# Patient Record
Sex: Male | Born: 1955
Health system: Southern US, Community
[De-identification: ages and names within clinical notes are randomized; demographics above are authoritative.]

## PROBLEM LIST (undated history)

## (undated) DIAGNOSIS — I639 Cerebral infarction, unspecified: Secondary | ICD-10-CM

## (undated) DIAGNOSIS — K859 Acute pancreatitis without necrosis or infection, unspecified: Secondary | ICD-10-CM

## (undated) DIAGNOSIS — Z951 Presence of aortocoronary bypass graft: Secondary | ICD-10-CM

## (undated) DIAGNOSIS — I10 Essential (primary) hypertension: Secondary | ICD-10-CM

## (undated) DIAGNOSIS — G629 Polyneuropathy, unspecified: Secondary | ICD-10-CM

## (undated) DIAGNOSIS — I219 Acute myocardial infarction, unspecified: Secondary | ICD-10-CM

## (undated) DIAGNOSIS — D649 Anemia, unspecified: Secondary | ICD-10-CM

## (undated) DIAGNOSIS — I679 Cerebrovascular disease, unspecified: Secondary | ICD-10-CM

## (undated) DIAGNOSIS — M1990 Unspecified osteoarthritis, unspecified site: Secondary | ICD-10-CM

## (undated) DIAGNOSIS — R0600 Dyspnea, unspecified: Secondary | ICD-10-CM

## (undated) DIAGNOSIS — K7460 Unspecified cirrhosis of liver: Secondary | ICD-10-CM

## (undated) DIAGNOSIS — I8500 Esophageal varices without bleeding: Secondary | ICD-10-CM

## (undated) DIAGNOSIS — F32A Depression, unspecified: Secondary | ICD-10-CM

## (undated) DIAGNOSIS — N189 Chronic kidney disease, unspecified: Secondary | ICD-10-CM

## (undated) DIAGNOSIS — K219 Gastro-esophageal reflux disease without esophagitis: Secondary | ICD-10-CM

## (undated) DIAGNOSIS — I2510 Atherosclerotic heart disease of native coronary artery without angina pectoris: Secondary | ICD-10-CM

## (undated) DIAGNOSIS — E785 Hyperlipidemia, unspecified: Secondary | ICD-10-CM

## (undated) DIAGNOSIS — R519 Headache, unspecified: Secondary | ICD-10-CM

## (undated) DIAGNOSIS — F419 Anxiety disorder, unspecified: Secondary | ICD-10-CM

## (undated) DIAGNOSIS — M5090 Cervical disc disorder, unspecified, unspecified cervical region: Secondary | ICD-10-CM

## (undated) DIAGNOSIS — D863 Sarcoidosis of skin: Secondary | ICD-10-CM

## (undated) DIAGNOSIS — E119 Type 2 diabetes mellitus without complications: Secondary | ICD-10-CM

## (undated) DIAGNOSIS — G4730 Sleep apnea, unspecified: Secondary | ICD-10-CM

## (undated) HISTORY — DX: Essential (primary) hypertension: I10

## (undated) HISTORY — DX: Hyperlipidemia, unspecified: E78.5

## (undated) HISTORY — DX: Gastro-esophageal reflux disease without esophagitis: K21.9

## (undated) HISTORY — DX: Type 2 diabetes mellitus without complications: E11.9

## (undated) SURGERY — Surgical Case
Anesthesia: *Unknown

---

## 2005-12-17 ENCOUNTER — Encounter: Admission: RE | Admit: 2005-12-17 | Discharge: 2005-12-17 | Payer: Self-pay | Admitting: Surgery

## 2007-04-23 DIAGNOSIS — I219 Acute myocardial infarction, unspecified: Secondary | ICD-10-CM

## 2007-04-23 HISTORY — DX: Acute myocardial infarction, unspecified: I21.9

## 2007-04-23 HISTORY — PX: CORONARY ANGIOPLASTY WITH STENT PLACEMENT: SHX49

## 2008-01-31 ENCOUNTER — Ambulatory Visit (HOSPITAL_COMMUNITY): Admission: RE | Admit: 2008-01-31 | Discharge: 2008-01-31 | Payer: Self-pay | Admitting: Specialist

## 2010-05-11 LAB — BASIC METABOLIC PANEL
CO2: 25 mEq/L (ref 19–32)
GFR calc non Af Amer: >60 mL/min (ref >60)
Glucose, Bld: 118 mg/dL — ABNORMAL HIGH (ref 70–99)

## 2010-05-11 LAB — CBC
Hemoglobin: 14.9 g/dL (ref 13.0–17.0)
MCHC: 33.2 g/dL (ref 30.0–36.0)
MCV: 93.7 fL (ref 78.0–100.0)
Platelets: 146 10*3/uL — ABNORMAL LOW (ref 150–400)
RDW: 15.4 % (ref 11.5–15.5)

## 2010-05-11 LAB — PROTIME-INR: Prothrombin Time: 12.4 seconds (ref 11.6–15.2)

## 2012-06-25 ENCOUNTER — Ambulatory Visit (HOSPITAL_COMMUNITY): Admit: 2012-06-25 | Payer: Self-pay | Admitting: Cardiovascular Disease

## 2012-06-25 ENCOUNTER — Emergency Department (HOSPITAL_COMMUNITY): Payer: 59

## 2012-06-25 ENCOUNTER — Encounter (HOSPITAL_COMMUNITY): Payer: Self-pay | Admitting: *Deleted

## 2012-06-25 ENCOUNTER — Encounter (HOSPITAL_COMMUNITY)
Admission: EM | Disposition: A | Discharge status: Skilled Nursing Facility | Payer: Self-pay | Source: Home / Self Care | Attending: Cardiology

## 2012-06-25 ENCOUNTER — Inpatient Hospital Stay (HOSPITAL_COMMUNITY)
Admission: EM | Admit: 2012-06-25 | Discharge: 2012-07-10 | DRG: 231 | Disposition: A | Discharge status: Skilled Nursing Facility | Payer: 59 | Attending: Thoracic Surgery (Cardiothoracic Vascular Surgery) | Admitting: Thoracic Surgery (Cardiothoracic Vascular Surgery)

## 2012-06-25 DIAGNOSIS — E119 Type 2 diabetes mellitus without complications: Secondary | ICD-10-CM | POA: Diagnosis present

## 2012-06-25 DIAGNOSIS — I252 Old myocardial infarction: Secondary | ICD-10-CM

## 2012-06-25 DIAGNOSIS — Y840 Cardiac catheterization as the cause of abnormal reaction of the patient, or of later complication, without mention of misadventure at the time of the procedure: Secondary | ICD-10-CM | POA: Diagnosis not present

## 2012-06-25 DIAGNOSIS — D62 Acute posthemorrhagic anemia: Secondary | ICD-10-CM | POA: Diagnosis not present

## 2012-06-25 DIAGNOSIS — E8779 Other fluid overload: Secondary | ICD-10-CM | POA: Diagnosis not present

## 2012-06-25 DIAGNOSIS — I214 Non-ST elevation (NSTEMI) myocardial infarction: Secondary | ICD-10-CM

## 2012-06-25 DIAGNOSIS — G4733 Obstructive sleep apnea (adult) (pediatric): Secondary | ICD-10-CM | POA: Diagnosis present

## 2012-06-25 DIAGNOSIS — J988 Other specified respiratory disorders: Secondary | ICD-10-CM | POA: Diagnosis not present

## 2012-06-25 DIAGNOSIS — S301XXA Contusion of abdominal wall, initial encounter: Secondary | ICD-10-CM

## 2012-06-25 DIAGNOSIS — E781 Pure hyperglyceridemia: Secondary | ICD-10-CM

## 2012-06-25 DIAGNOSIS — I2542 Coronary artery dissection: Secondary | ICD-10-CM | POA: Diagnosis not present

## 2012-06-25 DIAGNOSIS — S3012XA Contusion of groin, initial encounter: Secondary | ICD-10-CM

## 2012-06-25 DIAGNOSIS — G4730 Sleep apnea, unspecified: Secondary | ICD-10-CM

## 2012-06-25 DIAGNOSIS — F411 Generalized anxiety disorder: Secondary | ICD-10-CM | POA: Diagnosis not present

## 2012-06-25 DIAGNOSIS — Z9861 Coronary angioplasty status: Secondary | ICD-10-CM

## 2012-06-25 DIAGNOSIS — IMO0002 Reserved for concepts with insufficient information to code with codable children: Secondary | ICD-10-CM

## 2012-06-25 DIAGNOSIS — Z7982 Long term (current) use of aspirin: Secondary | ICD-10-CM

## 2012-06-25 DIAGNOSIS — Z951 Presence of aortocoronary bypass graft: Secondary | ICD-10-CM

## 2012-06-25 DIAGNOSIS — Y832 Surgical operation with anastomosis, bypass or graft as the cause of abnormal reaction of the patient, or of later complication, without mention of misadventure at the time of the procedure: Secondary | ICD-10-CM | POA: Diagnosis not present

## 2012-06-25 DIAGNOSIS — I2510 Atherosclerotic heart disease of native coronary artery without angina pectoris: Secondary | ICD-10-CM

## 2012-06-25 DIAGNOSIS — S3012XD Contusion of groin, subsequent encounter: Secondary | ICD-10-CM

## 2012-06-25 DIAGNOSIS — R0609 Other forms of dyspnea: Secondary | ICD-10-CM | POA: Diagnosis present

## 2012-06-25 DIAGNOSIS — I739 Peripheral vascular disease, unspecified: Secondary | ICD-10-CM

## 2012-06-25 DIAGNOSIS — D696 Thrombocytopenia, unspecified: Secondary | ICD-10-CM | POA: Diagnosis not present

## 2012-06-25 DIAGNOSIS — Z79899 Other long term (current) drug therapy: Secondary | ICD-10-CM

## 2012-06-25 DIAGNOSIS — E1165 Type 2 diabetes mellitus with hyperglycemia: Secondary | ICD-10-CM

## 2012-06-25 DIAGNOSIS — R0989 Other specified symptoms and signs involving the circulatory and respiratory systems: Secondary | ICD-10-CM | POA: Diagnosis present

## 2012-06-25 DIAGNOSIS — I519 Heart disease, unspecified: Secondary | ICD-10-CM | POA: Diagnosis not present

## 2012-06-25 DIAGNOSIS — S301XXD Contusion of abdominal wall, subsequent encounter: Secondary | ICD-10-CM

## 2012-06-25 DIAGNOSIS — Z6841 Body Mass Index (BMI) 40.0 and over, adult: Secondary | ICD-10-CM

## 2012-06-25 DIAGNOSIS — J9819 Other pulmonary collapse: Secondary | ICD-10-CM | POA: Diagnosis not present

## 2012-06-25 HISTORY — DX: Presence of aortocoronary bypass graft: Z95.1

## 2012-06-25 HISTORY — DX: Acute myocardial infarction, unspecified: I21.9

## 2012-06-25 HISTORY — DX: Morbid (severe) obesity due to excess calories: E66.01

## 2012-06-25 HISTORY — PX: LEFT HEART CATHETERIZATION WITH CORONARY ANGIOGRAM: SHX5451

## 2012-06-25 HISTORY — DX: Atherosclerotic heart disease of native coronary artery without angina pectoris: I25.10

## 2012-06-25 HISTORY — DX: Sleep apnea, unspecified: G47.30

## 2012-06-25 LAB — COMPREHENSIVE METABOLIC PANEL
ALT: 50 U/L (ref 0–53)
AST: 29 U/L (ref 0–37)
BUN: 14 mg/dL (ref 6–23)
CO2: 23 mEq/L (ref 19–32)
Creatinine, Ser: 0.78 mg/dL (ref 0.50–1.35)
Glucose, Bld: 302 mg/dL — ABNORMAL HIGH (ref 70–99)
Sodium: 132 mEq/L — ABNORMAL LOW (ref 135–145)
Total Bilirubin: 0.5 mg/dL (ref 0.3–1.2)
Total Protein: 7.5 g/dL (ref 6.0–8.3)

## 2012-06-25 LAB — CBC
HCT: 44.7 % (ref 39.0–52.0)
Hemoglobin: 16 g/dL (ref 13.0–17.0)
WBC: 7.2 10*3/uL (ref 4.0–10.5)

## 2012-06-25 LAB — PROTIME-INR: Prothrombin Time: 12.5 seconds (ref 11.6–15.2)

## 2012-06-25 SURGERY — LEFT HEART CATHETERIZATION WITH CORONARY ANGIOGRAM
Anesthesia: LOCAL

## 2012-06-25 MED ORDER — METOPROLOL TARTRATE 25 MG PO TABS
25.0000 mg | ORAL_TABLET | Freq: Once | ORAL | Status: DC
  Administered 2012-06-25: 25 mg via ORAL
  Filled 2012-06-25: qty 1

## 2012-06-25 MED ORDER — HEPARIN BOLUS VIA INFUSION: 5000.0000 [IU] | Freq: Once | INTRAVENOUS | Status: DC

## 2012-06-25 MED ORDER — FENTANYL CITRATE 0.05 MG/ML IJ SOLN
INTRAMUSCULAR | Status: AC
  Filled 2012-06-25: qty 2

## 2012-06-25 MED ORDER — SODIUM CHLORIDE 0.9 % IJ SOLN: 3.0000 mL | INTRAMUSCULAR | Status: DC | PRN

## 2012-06-25 MED ORDER — NITROGLYCERIN 0.4 MG SL SUBL: 0.4000 mg | SUBLINGUAL_TABLET | SUBLINGUAL | Status: DC | PRN

## 2012-06-25 MED ORDER — NITROGLYCERIN IN D5W 200-5 MCG/ML-% IV SOLN
2.0000 ug/min | Freq: Once | INTRAVENOUS | Status: AC
  Administered 2012-06-25: 5 ug/min via INTRAVENOUS
  Filled 2012-06-25: qty 250

## 2012-06-25 MED ORDER — VERAPAMIL HCL 2.5 MG/ML IV SOLN
INTRAVENOUS | Status: AC
  Filled 2012-06-25: qty 2

## 2012-06-25 MED ORDER — LIDOCAINE HCL (PF) 1 % IJ SOLN
INTRAMUSCULAR | Status: AC
  Filled 2012-06-25: qty 30

## 2012-06-25 MED ORDER — MORPHINE SULFATE 4 MG/ML IJ SOLN
4.0000 mg | Freq: Once | INTRAMUSCULAR | Status: AC
  Administered 2012-06-25: 4 mg via INTRAVENOUS
  Filled 2012-06-25: qty 1

## 2012-06-25 MED ORDER — SODIUM CHLORIDE 0.9 % IJ SOLN: 3.0000 mL | Freq: Two times a day (BID) | INTRAMUSCULAR | Status: DC

## 2012-06-25 MED ORDER — ASPIRIN 81 MG PO CHEW
81.0000 mg | CHEWABLE_TABLET | Freq: Every day | ORAL | Status: DC
  Administered 2012-06-26 – 2012-07-03 (×8): 81 mg via ORAL
  Filled 2012-06-25 (×8): qty 1

## 2012-06-25 MED ORDER — DIPHENHYDRAMINE HCL 25 MG PO TABS
50.0000 mg | ORAL_TABLET | Freq: Every evening | ORAL | Status: DC | PRN
  Filled 2012-06-25: qty 2

## 2012-06-25 MED ORDER — ASPIRIN EC 81 MG PO TBEC: 81.0000 mg | DELAYED_RELEASE_TABLET | Freq: Every day | ORAL | Status: DC

## 2012-06-25 MED ORDER — ZOLPIDEM TARTRATE 5 MG PO TABS: 5.0000 mg | ORAL_TABLET | Freq: Every evening | ORAL | Status: DC | PRN

## 2012-06-25 MED ORDER — ACETAMINOPHEN 325 MG PO TABS
650.0000 mg | ORAL_TABLET | ORAL | Status: DC | PRN
  Administered 2012-06-25: 650 mg via ORAL
  Filled 2012-06-25: qty 2

## 2012-06-25 MED ORDER — SODIUM CHLORIDE 0.9 % IV SOLN
INTRAVENOUS | Status: AC
  Administered 2012-06-26: 01:00:00 via INTRAVENOUS

## 2012-06-25 MED ORDER — TICAGRELOR 90 MG PO TABS
90.0000 mg | ORAL_TABLET | Freq: Two times a day (BID) | ORAL | Status: DC
  Administered 2012-06-26 (×2): 90 mg via ORAL
  Filled 2012-06-25 (×4): qty 1

## 2012-06-25 MED ORDER — HEPARIN (PORCINE) IN NACL 100-0.45 UNIT/ML-% IJ SOLN
1500.0000 [IU]/h | INTRAMUSCULAR | Status: DC
  Administered 2012-06-25: 1500 [IU]/h via INTRAVENOUS
  Filled 2012-06-25 (×2): qty 250

## 2012-06-25 MED ORDER — DIAZEPAM 5 MG PO TABS
5.0000 mg | ORAL_TABLET | ORAL | Status: AC
  Administered 2012-06-25: 5 mg via ORAL
  Filled 2012-06-25: qty 1

## 2012-06-25 MED ORDER — TICAGRELOR 90 MG PO TABS: 90.0000 mg | ORAL_TABLET | Freq: Two times a day (BID) | ORAL | Status: DC

## 2012-06-25 MED ORDER — TICAGRELOR 90 MG PO TABS
180.0000 mg | ORAL_TABLET | Freq: Once | ORAL | Status: DC
  Administered 2012-06-25: 180 mg via ORAL
  Filled 2012-06-25: qty 2

## 2012-06-25 MED ORDER — HEPARIN BOLUS VIA INFUSION
4000.0000 [IU] | Freq: Once | INTRAVENOUS | Status: AC
  Administered 2012-06-25: 4000 [IU] via INTRAVENOUS

## 2012-06-25 MED ORDER — METOPROLOL TARTRATE 12.5 MG HALF TABLET
12.5000 mg | ORAL_TABLET | Freq: Four times a day (QID) | ORAL | Status: DC
  Administered 2012-06-25 – 2012-06-26 (×2): 12.5 mg via ORAL
  Filled 2012-06-25 (×7): qty 1

## 2012-06-25 MED ORDER — ATORVASTATIN CALCIUM 80 MG PO TABS
80.0000 mg | ORAL_TABLET | Freq: Every day | ORAL | Status: DC
  Administered 2012-06-26 – 2012-07-03 (×8): 80 mg via ORAL
  Filled 2012-06-25 (×10): qty 1

## 2012-06-25 MED ORDER — MIDAZOLAM HCL 2 MG/2ML IJ SOLN
INTRAMUSCULAR | Status: AC
  Filled 2012-06-25: qty 2

## 2012-06-25 MED ORDER — BIVALIRUDIN 250 MG IV SOLR
INTRAVENOUS | Status: AC
  Filled 2012-06-25: qty 250

## 2012-06-25 MED ORDER — HEPARIN (PORCINE) IN NACL 2-0.9 UNIT/ML-% IJ SOLN
INTRAMUSCULAR | Status: AC
  Filled 2012-06-25: qty 1000

## 2012-06-25 MED ORDER — MORPHINE SULFATE 2 MG/ML IJ SOLN
2.0000 mg | INTRAMUSCULAR | Status: DC | PRN
  Administered 2012-06-26 (×3): 2 mg via INTRAVENOUS
  Filled 2012-06-25 (×3): qty 1

## 2012-06-25 MED ORDER — SODIUM CHLORIDE 0.9 % IV SOLN: 250.0000 mL | INTRAVENOUS | Status: DC | PRN

## 2012-06-25 MED ORDER — ACETAMINOPHEN 325 MG PO TABS
650.0000 mg | ORAL_TABLET | ORAL | Status: DC | PRN
  Administered 2012-06-27: 650 mg via ORAL
  Filled 2012-06-25: qty 2

## 2012-06-25 MED ORDER — ONDANSETRON HCL 4 MG/2ML IJ SOLN: 4.0000 mg | Freq: Four times a day (QID) | INTRAMUSCULAR | Status: DC | PRN

## 2012-06-25 MED ORDER — ALPRAZOLAM 0.25 MG PO TABS
0.2500 mg | ORAL_TABLET | Freq: Two times a day (BID) | ORAL | Status: DC | PRN
  Administered 2012-06-25: 0.25 mg via ORAL
  Filled 2012-06-25: qty 1

## 2012-06-25 MED ORDER — SODIUM CHLORIDE 0.9 % IV SOLN
INTRAVENOUS | Status: DC
  Administered 2012-06-25: 22:00:00 via INTRAVENOUS

## 2012-06-25 MED ORDER — NITROGLYCERIN IN D5W 200-5 MCG/ML-% IV SOLN
2.0000 ug/min | INTRAVENOUS | Status: AC
  Administered 2012-06-25: 30 ug/min via INTRAVENOUS
  Administered 2012-06-26: 40 ug/min via INTRAVENOUS

## 2012-06-25 NOTE — Progress Notes (Addendum)
ANTICOAGULATION CONSULT NOTE - Initial Consult  Pharmacy Consult for UFH Indication: NSTEMI  No Known Allergies  Patient Measurements: Height: 5\' 6"  (167.6 cm) Weight: 280 lb (127.007 kg) IBW/kg (Calculated) : 63.8 Heparin Dosing Weight: 94kg  Vital Signs: Temp: 97.9 F (36.6 C) (06/01 1016) Temp src: Oral (06/01 1016) BP: 138/87 mmHg (06/01 1350) Pulse Rate: 63 (06/01 1350)  Labs:  Recent Labs  06/25/12 1057 06/25/12 1349  HGB 16.0  --   HCT 44.7  --   PLT 152  --   LABPROT 12.5  --   INR 0.94  --   CREATININE 0.78  --   TROPONINI  --  0.47*    Estimated Creatinine Clearance: 131.5 ml/min (by C-G formula based on Cr of 0.78).   Medical History: Past Medical History  Diagnosis Date  . Coronary artery disease   . MI (myocardial infarction)   . Sleep apnea     Medications:   (Not in a hospital admission)  Assessment: 57 y/o male patient admitted with chest pain requiring anticoagulation for NSTEMI. First troponin is elevated. EKG pending.  Goal of Therapy:  Heparin level 0.3-0.7 units/ml Monitor platelets by anticoagulation protocol: Yes   Plan:  Heparin 4000 unit IV bolus followed by infusion at 1500 units/hr. Check 6 hour heparin level with daily cbc and heparin level.  Verlene Mayer, PharmD, BCPS Pager [phone] 06/25/2012,3:09 PM

## 2012-06-25 NOTE — ED Notes (Signed)
Patient transported to X-ray 

## 2012-06-25 NOTE — Progress Notes (Signed)
CTSP secondary to ongoing CP.  He has a history of CAD with PCI around 4 years ago.  He was admitted with unstable angina and ruled in for a NSTEMI.  Throughout today he has continued to have CP despite increasing NTG gtt to .  He currently complains of 5/10CP.  Troponin has increased from 0.47 to 2.02.  I have spoken to Dr. Allyson Sabal and we will take patient to the cath lab for cath due to evidence of ongoing ischemia by increasing cardiac enzymes in setting of continued CP.

## 2012-06-25 NOTE — H&P (Signed)
Physician History and Physical    Barry [name] MRN: [id number] DOB/AGE: Nov 11, 1955 57 y.o. Admit date: 06/25/2012  Primary Care Physician: None Primary Cardiologist: None  HPI:  57 yo with history of CAD s/p MI about 4 years ago and PCI in [place] presents to ER today with NSTEMI. Patient has been having exertional dyspnea but not chest pain for about a month.  The shortness of breath comes with walking more than just a short distance.  Last night, he developed a severe headache.  The headache resolved after Ibuprofen but he later woke up with substernal chest pressure.  This resolved in about 1 hour (he iced his chest) and he went back to sleep.  This morning, while making breakfast, the chest pain returned.  It was severe 10/10 like an elephant on his chest.  He went to the ER and had morphine and NTG sublingual.  He has just been started on NTG gtt at 5.  CP is down to 4/10 (much impoved but still there).  Initial TnI was 0.47.  Initial ECG showed slight ST elevation in II but repeat ECG showed that this had basically resolved.   Patient has not seen a doctor for about 4 years since around the time of his prior MI.  He is not taking any medications.  He does use CPAP for OSA.   Review of systems complete and found to be negative unless listed above   PMH: 1. CAD: MI about 4 years ago, had stent placed in [facility].  Has not had cardiology followup.  2. Obesity 3. OSA on CPAP  Family History:  Mother with "heart trouble"   History   Social History  . Marital Status: Married    Spouse Name: N/A    Number of Children: N/A  . Years of Education: N/A   Occupational History  . Maintenance for [employer]   Social History Main Topics  . Smoking status: Never smoked  . Smokeless tobacco: Not on file  . Alcohol Use: Not on file  . Drug Use: Not on file  . Sexually Active: Not on file   Other Topics Concern  . Not on file   Social History Narrative  . Lives in [place] [facility]       (Not in a hospital admission)  Physical Exam: Blood pressure 138/87, pulse 63, temperature 97.9 F (36.6 C), temperature source Oral, resp. rate 19, height 5\' 6"  (1.676 m), weight 280 lb (127.007 kg), SpO2 95.00%.  General: NAD, obese Neck: Thick, JVP difficult, no thyromegaly or thyroid nodule.  Lungs: Clear to auscultation bilaterally with normal respiratory effort. CV: Nondisplaced PMI.  Heart regular S1/S2, no S3/S4, no murmur.  No peripheral edema.  No carotid bruit.  Normal pedal pulses.  Abdomen: Soft, nontender, no hepatosplenomegaly, no distention.  Skin: Intact without lesions or rashes.  Neurologic: Alert and oriented x 3.  Psych: Normal affect. Extremities: No clubbing or cyanosis.  HEENT: Normal.   Labs:   Lab Results  Component Value Date   WBC 7.2 06/25/2012   HGB 16.0 06/25/2012   HCT 44.7 06/25/2012   MCV 86.3 06/25/2012   PLT 152 06/25/2012    Recent Labs Lab 06/25/12 1057  NA 132*  K 4.1  CL 97  CO2 23  BUN 14  CREATININE 0.78  CALCIUM 9.4  PROT 7.5  BILITOT 0.5  ALKPHOS 100  ALT 50  AST 29  GLUCOSE 302*   Lab Results  Component Value Date  TROPONINI 0.47* 06/25/2012     Radiology: - CXR: Cardiomegaly, otherwise lungs clear  EKG: NSR, very slight ST elevation in II on initial ECG, seems to mostly resolve on followup ECG.   ASSESSMENT AND PLAN:  57 yo with history of CAD s/p MI about 4 years ago and PCI in [place] presents to ER today with NSTEMI.  1. CAD: NSTEMI.  Patient has not taken any meds for years.  He developed CP last night initially then recurred this morning.  Pain was 10/10, now resolving since NTG gtt started and 4/10.  ECG with slight ST elevation II initially, now resolved on repeat ECG.  - Cycle cardiac enzymes to peak - Cycle ECGs - Will need LHC, likely in am if pain resolves completely (will turn up NTG gtt).  - I will load Brilinta this afternoon.  - heparin gtt, ASA 81, atorvastatin 80, metoprolol 12.5 mg po q6 hrs.  -  Check lipids and hemoglobin A1c.  2. OSA: Will continue CPAP.   Signed: Marca Ancona 06/25/2012, 3:50 PM

## 2012-06-25 NOTE — H&P (Signed)
    Pt was reexamined and existing H & P reviewed. No changes found.  [name], MD [facility] 06/25/2012 9:44 PM

## 2012-06-25 NOTE — Progress Notes (Deleted)
ANTICOAGULATION CONSULT NOTE - Initial Consult  Pharmacy Consult for Heparin Indication:  Chest Pain  No Known Allergies  Patient Measurements: Height: 5\' 6"  (167.6 cm) Weight: 280 lb (127.007 kg) IBW/kg (Calculated) : 63.8  Vital Signs: Temp: 97.9 F (36.6 C) (06/01 1016) Temp src: Oral (06/01 1016) BP: 138/87 mmHg (06/01 1350) Pulse Rate: 63 (06/01 1350)  Labs:  Recent Labs  06/25/12 1057 06/25/12 1349  HGB 16.0  --   HCT 44.7  --   PLT 152  --   LABPROT 12.5  --   INR 0.94  --   CREATININE 0.78  --   TROPONINI  --  0.47*   Estimated Creatinine Clearance: 131.5 ml/min (by C-G formula based on Cr of 0.78).  Medical History: Past Medical History  Diagnosis Date  . Coronary artery disease   . MI (myocardial infarction)   . Sleep apnea    Medications:  Aspirin 81 mg daily  Assessment: 57 yo male admitted with ongoing chest pain since this morning.  He has a past medical history of CAD with stent ~4 years ago.  No noted bleeding history and patient states he only takes aspirin at home.  His CBC is stable.  Troponin is elevated.  Goal of Therapy:  Heparin level 0.3-0.7 units/ml Monitor platelets by anticoagulation protocol: Yes   Plan:   Heparin 5000 units IV bolus x 1  Heparin 1000 units./hr  Check 8 hour heparin level if he doesn't go to cath lab.  Nadara Mustard, PharmD., MS Clinical Pharmacist Pager:  [phone] Thank you for allowing pharmacy to be part of this patients care team.  06/25/2012,3:09 PM

## 2012-06-25 NOTE — ED Notes (Signed)
Pt arrived by [facility]. Reports mid chest pressure that started last night with nausea. Pain radiates to abd, thought it was heartburn. Pt was pale and diaphoretic on ems arrival, took asa at home. Received 3 nitro pta, pain is 2/10 now. Last bp 140/90. Reports mild sob, increases with movement.

## 2012-06-25 NOTE — CV Procedure (Signed)
Barry [name] is a 57 y.o. male    [id number] LOCATION:  FACILITY: [facility]  PHYSICIAN: Nanetta Batty, M.D. 1955-02-05   DATE OF PROCEDURE:  06/25/2012  DATE OF DISCHARGE:  [facility]  CARDIAC CATHETERIZATION     History obtained from chart review.57 yo with history of CAD s/p MI about 4 years ago and PCI in [place] presents to ER today with NSTEMI. Patient has been having exertional dyspnea but not chest pain for about a month. The shortness of breath comes with walking more than just a short distance. Last night, he developed a severe headache. The headache resolved after Ibuprofen but he later woke up with substernal chest pressure. This resolved in about 1 hour (he iced his chest) and he went back to sleep. This morning, while making breakfast, the chest pain returned. It was severe 10/10 like an elephant on his chest. He went to the ER and had morphine and NTG sublingual. He has just been started on NTG gtt at 5. CP is down to 4/10 (much impoved but still there). Initial TnI was 0.47. Initial ECG showed slight ST elevation in II but repeat ECG showed that this had basically resolved. Because of a slightly increased troponin and mildly increased chest pain with increasing doses of IV nitroglycerin Dr. Carolanne Grumbling evaluated the patient and felt that the patient should come to the Cath Lab for intervention.    PROCEDURE DESCRIPTION:    The patient was brought to the second floor [facility] cath lab in the postabsorptive state. He was premedicated with Valium 5 mg by mouth, IV Versed and fentanyl.Marland Kitchen His right groin was prepped and shaved in usual sterile fashion (after failed attempt at radial access). Xylocaine 1% was used for local anesthesia. A 6 French sheath was inserted into the right common femoral  artery using standard Seldinger technique.6 French right and left Judkins diagnostic catheters along with a 6 French pigtail catheter were used for selective  coronary angiography, left ventriculography, subselective left internal mammary artery angiography and distal abdominal aortography. Visipaque dye was used for the entirety of the case. Retrograde aortic, left ventricular and pullback pressures were recorded.   HEMODYNAMICS:    AO SYSTOLIC/AO DIASTOLIC: 100/70   LV SYSTOLIC/LV DIASTOLIC: 101/18  ANGIOGRAPHIC RESULTS:   1. Left main; normal  2. LAD; 95-99% stenosis at the bifurcation of the first large side branch probably representing "in-stent restenosis with a proximal LAD stent. 3. Left circumflex; nondominant and normal.  4. Right coronary artery; dominant 7580% segmental proximal, 80% mid and a total posterolateral branch representing the "culprit vessel" 5.LIMA was subselectively visualized a widely patent. It was suitable for use during cord artery bypass grafting if necessary 6. Left ventriculography; RAO left ventriculogram was performed using  25 mL of Visipaque dye at 12 mL/second. The overall LVEF estimated  50-55 %  With wall motion abnormalities notable for mild inferobasal hypokinesia 7. Abdominal aortography: Renal arteries are widely patent. The infrarenal abdominal aorta and iliac bifurcation were free of significant atherosclerotic changes  IMPRESSION:Barry [name] has a totally occluded distal right with high-grade proximal LAD diagonal branch bifurcation disease. He hasn't inferior wall motion abnormalities suggesting that his distal RCA is infarct-related artery. He has already been loaded with Brilenta. Proceed with PCI and stenting of his dominant RCA.   Procedure description: the patient received Angiomax bolus with an ACT of 410. A total of 225 cc of contrast was administered to the patient. Initially a JR  4 with sidehole catheter was used to perform intervention however this resulted in a linear dissection of the proximal RCA which spiraled down to the midportion. I then switched to a All right guide catheter with  sideholes along with an 014/300 cm length Asahi Prowater guidewire. I was able to reenter the true lumen across the total occlusion. A Z-plasty with a 2 mm x 12 mm long apex balloon restoring antegrade flow. I then stented the entire proximal and mid dominant RCA with overlapping Promus Premier drug-eluting stents (30/38, 30/20). I then used a 20 by 18 mm long mini vision bare-metal stent in the distal RCA deployed at 16 atmospheres (2.3 mm) resulting in reduction of a total occlusion to 0% residual. I postdilated the proximal and mid stented segment with a 3.25 x 20 mm donor long noncompliant balloon up to 16 atmospheres. There was excellent flow in the procedure the patient remained hemodynamically stable. Dr. Tyrone Sage was present at the end we discussed future or not revascularization strategies to the LAD diagonal branch.  Final impression: Successful PCI and stenting of the dominant RCA complicated by spiral dissection with 2 overlapping drug-eluting stents proximally and mid and a bare-metal stent distal. The patient will be treated with aspirin and Brilenta. I am dubious that his LAD diagonal branch is percutaneously addressable I suspect he'll need a LIMA to to those vessels. We will have to stop the Brilenta &  start Integrilin. We'll review colleagues.[name] MD, [facility] 06/25/2012 11:55 PM

## 2012-06-25 NOTE — ED Provider Notes (Signed)
History     CSN: [id number]  Arrival date & time 06/25/12  1008   First MD Initiated Contact with Patient 06/25/12 1018      Chief Complaint  Patient presents with  . Chest Pain     HPI Patient reports his had chest pain for most of the morning.  He has known history of coronary artery disease with a stent to his RCA years ago.  He has not taking any Plavix at this time is only on 81 mg aspirin.  He reports over the past several weeks she's had intermittent chest pain with associated shortness of breath it seems to be exertional in nature.  He does however also report a new rash in his chest and reports that his pain in his chest exacerbated with movement of his arms as well as pressure in his chest.  He became concerned about this chest discomfort this morning because it was more severe and thus he presented emergency para per evaluation.  EMS was called.  Aspirin and nitroglycerin given.  The patient reports some improvement with nitroglycerin but still reports ongoing pain in his chest when I press on it.  He denies fevers and chills.  No cough or congestion.  No shortness of breath at this time.  Her shoulder pain arm pain back pain or jaw pain at this time.   Past Medical History  Diagnosis Date  . Coronary artery disease   . MI (myocardial infarction)   . Sleep apnea     Past Surgical History  Procedure Laterality Date  . Coronary angioplasty with stent placement      History reviewed. No pertinent family history.  History  Substance Use Topics  . Smoking status: Not on file  . Smokeless tobacco: Not on file  . Alcohol Use: Not on file      Review of Systems  All other systems reviewed and are negative.    Allergies  Review of patient's allergies indicates no known allergies.  Home Medications   Current Outpatient Rx  Name  Route  Sig  Dispense  Refill  . aspirin 81 MG chewable tablet   Oral   Chew 324 mg by mouth once.         . diphenhydrAMINE  (BENADRYL) 25 MG tablet   Oral   Take 50 mg by mouth at bedtime as needed for itching or allergies.         Marland Kitchen ibuprofen (ADVIL,MOTRIN) 200 MG tablet   Oral   Take 600-800 mg by mouth every 8 (eight) hours as needed for pain.           BP 138/87  Pulse 63  Temp(Src) 97.9 F (36.6 C) (Oral)  Resp 19  SpO2 95%  Physical Exam  Nursing note and vitals reviewed. Constitutional: He is oriented to person, place, and time. He appears well-developed and well-nourished.  HENT:  Head: Normocephalic and atraumatic.  Eyes: EOM are normal.  Neck: Normal range of motion.  Cardiovascular: Normal rate, regular rhythm, normal heart sounds and intact distal pulses.   Pulmonary/Chest: Effort normal and breath sounds normal. No respiratory distress. He exhibits tenderness.  Abdominal: Soft. He exhibits no distension. There is no tenderness. There is no rebound and no guarding.  Genitourinary: Rectum normal.  Musculoskeletal: Normal range of motion.  Neurological: He is alert and oriented to person, place, and time.  Skin: Skin is warm and dry.  Psychiatric: He has a normal mood and affect. Judgment normal.  ED Course  Procedures (including critical care time)   Date: 06/25/2012 1019am  Rate: 60  Rhythm: normal sinus rhythm  QRS Axis: normal  Intervals: normal  ST/T Wave abnormalities: Nonspecific ST change to lead II only, no ST depression or reciprocal changes   Conduction Disutrbances: none  Narrative Interpretation:   Old EKG Reviewed: No prior EKG available   Date: 06/25/2012 1030am  Rate: 63  Rhythm: normal sinus rhythm  QRS Axis: normal  Intervals: normal  ST/T Wave abnormalities: Nonspecific isolated ST change to lead II only  Conduction Disutrbances: none  Narrative Interpretation:   Old EKG Reviewed: No significant changes noted   CRITICAL CARE Performed by: Lyanne Co Total critical care time: 32 Critical care time was exclusive of separately billable  procedures and treating other patients. Critical care was necessary to treat or prevent imminent or life-threatening deterioration. Critical care was time spent personally by me on the following activities: development of treatment plan with patient and/or surrogate as well as nursing, discussions with consultants, evaluation of patient's response to treatment, examination of patient, obtaining history from patient or surrogate, ordering and performing treatments and interventions, ordering and review of laboratory studies, ordering and review of radiographic studies, pulse oximetry and re-evaluation of patient's condition.    Labs Reviewed  COMPREHENSIVE METABOLIC PANEL - Abnormal; Notable for the following:    Sodium 132 (*)    Glucose, Bld 302 (*)    All other components within normal limits  TROPONIN I - Abnormal; Notable for the following:    Troponin I 0.47 (*)    All other components within normal limits  CBC  PROTIME-INR   Dg Chest 2 View  06/25/2012   *RADIOLOGY REPORT*  Clinical Data: Chest pain, shortness of breath  CHEST - 2 VIEW  Comparison: None.  Findings: Mild cardiomegaly.  Lungs clear.  No effusion.  Regional bones unremarkable.  IMPRESSION: There are mild cardiomegaly   Original Report Authenticated By: D. Andria Rhein, MD   I personally reviewed the imaging tests through PACS system I reviewed available ER/hospitalization records through the EMR   1. NSTEMI (non-ST elevated myocardial infarction)    2.  Unstable angina   MDM  Both typical and atypical components as the patient's story is somewhat concerning for unstable angina except for the point that he is sore when I press on his chest.  He did have one isolated nonspecific ST change in lead II that has not significantly changed on serial EKG.  No prior EKG available.  Patient does have a known history of coronary artery disease with a stent to his RCA.  Troponins come back at 0.47.  This may represent change of his  troponins secondary to unstable angina.  The patient be started on a heparin drip and nitro drip at this time.  Still is having some "heaviness" in his arms and thus a repeat EKG will be performed at this time.  Doubt pulmonary embolism.  No hypoxia.  Chest x-ray clear.  Cardiology to be consulted.         Lyanne Co, MD 06/25/12 1459

## 2012-06-26 ENCOUNTER — Other Ambulatory Visit: Payer: Self-pay | Admitting: *Deleted

## 2012-06-26 ENCOUNTER — Encounter (HOSPITAL_COMMUNITY): Payer: Self-pay | Admitting: Thoracic Surgery (Cardiothoracic Vascular Surgery)

## 2012-06-26 DIAGNOSIS — S301XXA Contusion of abdominal wall, initial encounter: Secondary | ICD-10-CM

## 2012-06-26 DIAGNOSIS — IMO0001 Reserved for inherently not codable concepts without codable children: Secondary | ICD-10-CM

## 2012-06-26 DIAGNOSIS — G4730 Sleep apnea, unspecified: Secondary | ICD-10-CM

## 2012-06-26 DIAGNOSIS — E1165 Type 2 diabetes mellitus with hyperglycemia: Secondary | ICD-10-CM | POA: Diagnosis present

## 2012-06-26 DIAGNOSIS — G4733 Obstructive sleep apnea (adult) (pediatric): Secondary | ICD-10-CM | POA: Diagnosis present

## 2012-06-26 DIAGNOSIS — E781 Pure hyperglyceridemia: Secondary | ICD-10-CM | POA: Diagnosis present

## 2012-06-26 DIAGNOSIS — I2510 Atherosclerotic heart disease of native coronary artery without angina pectoris: Secondary | ICD-10-CM

## 2012-06-26 DIAGNOSIS — I214 Non-ST elevation (NSTEMI) myocardial infarction: Secondary | ICD-10-CM | POA: Diagnosis present

## 2012-06-26 HISTORY — DX: Morbid (severe) obesity due to excess calories: E66.01

## 2012-06-26 LAB — POCT ACTIVATED CLOTTING TIME: Activated Clotting Time: 410 seconds

## 2012-06-26 LAB — CBC
HCT: 43.6 % (ref 39.0–52.0)
Hemoglobin: 14.7 g/dL (ref 13.0–17.0)
MCH: 29.7 pg (ref 26.0–34.0)
MCV: 88.1 fL (ref 78.0–100.0)
RBC: 4.95 MIL/uL (ref 4.22–5.81)

## 2012-06-26 LAB — TROPONIN I: Troponin I: 10.01 ng/mL (ref <0.30)

## 2012-06-26 LAB — BASIC METABOLIC PANEL
BUN: 16 mg/dL (ref 6–23)
CO2: 25 mEq/L (ref 19–32)
Calcium: 8.7 mg/dL (ref 8.4–10.5)
Chloride: 96 mEq/L (ref 96–112)
Creatinine, Ser: 0.9 mg/dL (ref 0.50–1.35)
Glucose, Bld: 296 mg/dL — ABNORMAL HIGH (ref 70–99)

## 2012-06-26 LAB — LIPID PANEL
Cholesterol: 165 mg/dL (ref 0–200)
LDL Cholesterol: 86 mg/dL (ref 0–99)
Triglycerides: 247 mg/dL — ABNORMAL HIGH (ref <150)

## 2012-06-26 LAB — HEMOGLOBIN A1C
Hgb A1c MFr Bld: 10.3 % — ABNORMAL HIGH (ref <5.7)
Mean Plasma Glucose: 249 mg/dL — ABNORMAL HIGH (ref <117)

## 2012-06-26 MED ORDER — METOPROLOL TARTRATE 12.5 MG HALF TABLET
12.5000 mg | ORAL_TABLET | Freq: Four times a day (QID) | ORAL | Status: DC
  Administered 2012-06-26 – 2012-07-03 (×30): 12.5 mg via ORAL
  Filled 2012-06-26 (×35): qty 1

## 2012-06-26 MED ORDER — METOPROLOL TARTRATE 12.5 MG HALF TABLET
12.5000 mg | ORAL_TABLET | Freq: Once | ORAL | Status: AC
  Administered 2012-06-26: 12.5 mg via ORAL
  Filled 2012-06-26: qty 1

## 2012-06-26 MED ORDER — ATROPINE SULFATE 1 MG/ML IJ SOLN
INTRAMUSCULAR | Status: AC
  Filled 2012-06-26: qty 1

## 2012-06-26 MED ORDER — INSULIN ASPART 100 UNIT/ML ~~LOC~~ SOLN
0.0000 [IU] | Freq: Three times a day (TID) | SUBCUTANEOUS | Status: DC
  Administered 2012-06-26: 5 [IU] via SUBCUTANEOUS
  Administered 2012-06-26: 09:00:00 via SUBCUTANEOUS
  Administered 2012-06-26: 3 [IU] via SUBCUTANEOUS
  Administered 2012-06-27: 8 [IU] via SUBCUTANEOUS
  Administered 2012-06-27: 5 [IU] via SUBCUTANEOUS
  Administered 2012-06-27 – 2012-06-29 (×7): 3 [IU] via SUBCUTANEOUS
  Administered 2012-06-30: 2 [IU] via SUBCUTANEOUS
  Administered 2012-06-30 – 2012-07-01 (×4): 3 [IU] via SUBCUTANEOUS
  Administered 2012-07-01: 2 [IU] via SUBCUTANEOUS
  Administered 2012-07-02 – 2012-07-03 (×3): 3 [IU] via SUBCUTANEOUS
  Administered 2012-07-03 (×2): 2 [IU] via SUBCUTANEOUS

## 2012-06-26 MED ORDER — INSULIN ASPART 100 UNIT/ML ~~LOC~~ SOLN
4.0000 [IU] | Freq: Three times a day (TID) | SUBCUTANEOUS | Status: DC
  Administered 2012-06-26: 4 [IU] via SUBCUTANEOUS
  Administered 2012-06-26: 09:00:00 via SUBCUTANEOUS
  Administered 2012-06-26 – 2012-07-03 (×22): 4 [IU] via SUBCUTANEOUS

## 2012-06-26 MED ORDER — LORAZEPAM 2 MG/ML IJ SOLN
0.5000 mg | Freq: Once | INTRAMUSCULAR | Status: AC
  Administered 2012-06-26: 0.5 mg via INTRAVENOUS

## 2012-06-26 MED ORDER — LIVING WELL WITH DIABETES BOOK
Freq: Once | Status: AC
  Administered 2012-06-26: 11:00:00
  Filled 2012-06-26: qty 1

## 2012-06-26 MED ORDER — LORAZEPAM 2 MG/ML IJ SOLN
INTRAMUSCULAR | Status: AC
  Filled 2012-06-26: qty 1

## 2012-06-26 MED FILL — Sodium Chloride IV Soln 0.9%: INTRAVENOUS | Qty: 50 | Status: AC

## 2012-06-26 NOTE — Progress Notes (Signed)
Patient Name: Barry [name] Date of Encounter: 06/26/2012   Principal Problem:   NSTEMI (non-ST elevated myocardial infarction) Active Problems:   CAD (coronary artery disease)   Diabetes mellitus type II, uncontrolled   Groin hematoma   Sleep apnea   Hypertriglyceridemia   SUBJECTIVE  No chest pain or sob.  Notes reviewed.  Significant, prolonged groin bleeding last night.  Pt is on brilinta, though cath note discusses plan for bridging with 2b3a as he will likely need surgery for prox LAD/Diag dzs.  He is currently w/o complaints.  CURRENT MEDS . aspirin  81 mg Oral Daily  . atorvastatin  80 mg Oral q1800  . insulin aspart  0-15 Units Subcutaneous TID WC  . insulin aspart  4 Units Subcutaneous TID WC  . metoprolol tartrate  12.5 mg Oral Q6H  . Ticagrelor  90 mg Oral BID   OBJECTIVE  Filed Vitals:   06/26/12 0500 06/26/12 0530 06/26/12 0545 06/26/12 0600  BP: 128/75 129/69 125/68 131/73  Pulse: 87 84 83 82  Temp:      TempSrc:      Resp: 15 15 14 13   Height:      Weight:      SpO2: 97% 97% 97% 97%    Intake/Output Summary (Last 24 hours) at 06/26/12 0717 Last data filed at 06/26/12 0600  Gross per 24 hour  Intake 886.85 ml  Output    700 ml  Net 186.85 ml   Filed Weights   06/25/12 1459 06/25/12 1810 06/26/12 0431  Weight: 280 lb (127.007 kg) 264 lb (119.75 kg) 189 lb 13.1 oz (86.1 kg)   PHYSICAL EXAM  General: Pleasant, NAD. Neuro: Alert and oriented X 3. Moves all extremities spontaneously. Psych: Normal affect. HEENT:  Normal  Neck: Supple without bruits or JVD. Lungs:  Resp regular and unlabored, CTA. Heart: RRR no s3, s4, or murmurs. Abdomen: Soft, non-tender, non-distended, BS + x 4.  Extremities: No clubbing, cyanosis or edema. R groin tender, mildly ecchymotic, soft.  No bruit or bleeding noted.  Accessory Clinical Findings  CBC  Recent Labs  06/25/12 1057 06/26/12 0355  WBC 7.2 11.7*  HGB 16.0 14.7  HCT 44.7 43.6  MCV 86.3 88.1  PLT  152 162   Basic Metabolic Panel  Recent Labs  06/25/12 1057 06/25/12 1911 06/26/12 0355  NA 132*  --  132*  K 4.1  --  4.4  CL 97  --  96  CO2 23  --  25  GLUCOSE 302*  --  296*  BUN 14  --  16  CREATININE 0.78  --  0.90  CALCIUM 9.4  --  8.7  MG  --  1.6  --    Liver Function Tests  Recent Labs  06/25/12 1057  AST 29  ALT 50  ALKPHOS 100  BILITOT 0.5  PROT 7.5  ALBUMIN 3.7   Cardiac Enzymes  Recent Labs  06/25/12 1606 06/25/12 1911 06/26/12 0355  TROPONINI 0.88* 2.02* 10.01*   Fasting Lipid Panel  Recent Labs  06/26/12 0355  CHOL 165  HDL 30*  LDLCALC 86  TRIG 629*  CHOLHDL 5.5   TELE  rsr/sb  ECG  Rsr, 82, slight st elevation I, aVL, inf infarct with twi II, left axis.  Radiology/Studies  Dg Chest 2 View  06/25/2012   *RADIOLOGY REPORT*  Clinical Data: Chest pain, shortness of breath  CHEST - 2 VIEW  Comparison: None.  Findings: Mild cardiomegaly.  Lungs clear.  No effusion.  Regional bones unremarkable.  IMPRESSION: There are mild cardiomegaly   Original Report Authenticated By: D. Andria Rhein, MD   ASSESSMENT AND PLAN  1.  Acute NSTEMI/CAD:  S/p cath/pci of RCA.  Residual proximal LAD/Diag dzs.  Interventional team to review for consideration of thoracic surgery consult and bypass of the LAD/Diagonal/RCA.  He is currently ordered brilinta - continue until decision made re: surgery.  In light of groin bleed, would not start Integrilin today.  Cont asa, bb, statin. Addendum: See note below.  2.  R Groin bleed:  Now with soft hematoma.  No bruit.  CBC stable this AM. F/u tomorrow AM.  3.  HTG:  High potency statin initiated for ACS.  4.  DMII - Newly Dx:  Markedly elevated blood glucoses on bmet's since admission.  A1c drawn this AM and pending.  Will institute SSI and ask diabetes mgmt to see.  5.  OSA:  Uses CPAP @ home - will order.  6.  Obesity:  Cardiac rehab to eval.  Yesterday's weight inaccurate.  Signed, Nicolasa Ducking NP  I  have personally seen and examined this patient with Ward Givens, NP. I agree with the assessment and plan as outlined above. Mr. Rowand was admitted with a NSTEMI. Urgent cath 06/25/12 by Dr. Allyson Sabal. RCA totally occluded distally and LAD with complex severe stenosis at bifurcation with large diagonal branch at site of old stent. The RCA was treated with PCI yesterday with 3 stents placed from the ostium of the vessel throughout the vessel into the distal segment secondary to vessel dissection. He is now on Brilinta. He did have groin bleeding overnight but stable this am with stable H/H. I have reviewed his films and agree that PCI of the LAD/Diagonal would be difficult and would likely not yield a favorable result. We will continue Brilinta today and will consult CT Surgery for potential CABG of the LAD/Diagonal and RCA. His surgery will need to be delayed for 5 days once Brilinta is held. I would favor 24 more hours of Brilinta given his groin bleeding overnight. Then will plan on holding Brilinta after tomorrow and starting Integrilin drip for anti-platelet effects while Brilinta is washing out. He would be able to have CABG at first of next week. He is stable this am with no complaints.   MCALHANY,CHRISTOPHER 9:11 AM 06/26/2012

## 2012-06-26 NOTE — Progress Notes (Signed)
CTSP secondary to continued groin bleed.  Patient back from cath lab and was tensing up while groin pressure applied.  0.5mg  IV Ativan given with improvement in patient's anxiety but groin continues to bleed.  Nurses have been holding pressure for 2 hours and each time pressure is released there is pulsatile blood flow.  BP is well controlled.  No hematoma and distal pulses are intact.  I have talked with Dr. Arbie Cookey who will come in and place a vascular suture.

## 2012-06-26 NOTE — Consult Note (Addendum)
[address]       [name] [address]             [phone]          CARDIOTHORACIC SURGERY CONSULTATION REPORT  PCP is Albertina Senegal, MD Referring Provider is Kathleene Hazel, MD   Reason for consultation:  Severe 2-vessel CAD s/p acute non-STEMI  HPI:  Patient is a 57 year old morbidly obese white male from [facility] with known history of coronary artery disease and recently discovered type 2 diabetes mellitus and hyperlipidemia was admitted to the hospital yesterday with an acute non-ST segment elevation myocardial infarction. The patient's cardiac history dates back to 2009 when he sustained an acute inferior wall myocardial infarction. He was treated at [facility] where he underwent PCI and stenting of the mid RCA by Dr. Bary Castilla. He was only seen in followup recently and subsequently released to his primary care physician. He stopped taking all medications and hasn't been back to a physician since then.  He states that several months ago he began to experience worsening exertional shortness of breath, and over the last few weeks he has developed substernal chest discomfort with exertion. These symptoms continued to worsen and the patient began to experience nocturnal shortness of breath and chest discomfort. This past Saturday night he had prolonged episode of chest discomfort and shortness of breath.  Yesterday morning when he awoke for chest pain returned and became more severe ultimately prompting the patient to present to the emergency room where chest pain was relieved with administration of nitroglycerin. Initial ECG reveals slight ST segment elevation in lead to but repeat ECG demonstrated that the findings had resolved.  However, the patient continued to have recurrent episodes of chest pain despite escalating doses of intravenous nitroglycerin, and troponin levels notably increased from 0.472 2.0 to. He was subsequently brought  to the Cath Lab last night by Dr. Allyson Sabal where he was found to have severe three-vessel coronary artery disease with what appeared to be acute occlusion of the terminal portions of the distal right coronary artery beyond the bifurcation. He underwent PCI and stenting of the right coronary artery but this procedure was complicated by acute coronary dissection of the proximal right coronary artery requiring multiple long overlapping drug-eluting stents. Ultimately the procedure was completed and the patient has remained stable since no recurrent chest pain. During the procedure the patient was loaded with Brilinta.  Post catheterization the patient developed ongoing bleeding from his right groin. This has subsequently stopped.  Cardiothoracic surgical consultation has been requested to consider surgical revascularization.   Past Medical History  Diagnosis Date  . Coronary artery disease   . MI (myocardial infarction)   . Sleep apnea   . Morbid obesity 06/26/2012    Past Surgical History  Procedure Laterality Date  . Coronary angioplasty with stent placement      Family History  Problem Relation Age of Onset  . Coronary artery disease Mother     History   Social History  . Marital Status: Married    Spouse Name: N/A    Number of Children: N/A  . Years of Education: N/A   Occupational History  . Not on file.   Social History Main Topics  . Smoking status: Never Smoker   . Smokeless tobacco: Never Used  . Alcohol Use: No  . Drug Use: No  . Sexually Active: Not on file   Other Topics Concern  .  Not on file   Social History Narrative  . No narrative on file    Prior to Admission medications   Medication Sig Start Date End Date Taking? Authorizing Provider  aspirin 81 MG chewable tablet Chew 324 mg by mouth once.   Yes Historical Provider, MD  diphenhydrAMINE (BENADRYL) 25 MG tablet Take 50 mg by mouth at bedtime as needed for itching or allergies.   Yes Historical Provider, MD    ibuprofen (ADVIL,MOTRIN) 200 MG tablet Take 600-800 mg by mouth every 8 (eight) hours as needed for pain.   Yes Historical Provider, MD    Current Facility-Administered Medications  Medication Dose Route Frequency Provider Last Rate Last Dose  . acetaminophen (TYLENOL) tablet 650 mg  650 mg Oral Q4H PRN [name], MD      . ALPRAZolam Prudy Feeler) tablet 0.25 mg  0.25 mg Oral BID PRN Gery Pray, PA-C   0.25 mg at 06/25/12 1954  . aspirin chewable tablet 81 mg  81 mg Oral Daily [name], MD   81 mg at 06/26/12 0924  . atorvastatin (LIPITOR) tablet 80 mg  80 mg Oral q1800 Roger A Arguello, PA-C   80 mg at 06/26/12 1742  . diphenhydrAMINE (BENADRYL) tablet 50 mg  50 mg Oral QHS PRN Roger A Arguello, PA-C      . insulin aspart (novoLOG) injection 0-15 Units  0-15 Units Subcutaneous TID WC Ok Anis, NP   3 Units at 06/26/12 1742  . insulin aspart (novoLOG) injection 4 Units  4 Units Subcutaneous TID WC Ok Anis, NP   4 Units at 06/26/12 1742  . metoprolol tartrate (LOPRESSOR) tablet 12.5 mg  12.5 mg Oral Q6H Laurey Morale, MD   12.5 mg at 06/26/12 1742  . morphine 2 MG/ML injection 2 mg  2 mg Intravenous Q1H PRN [name], MD   2 mg at 06/26/12 0340  . nitroGLYCERIN (NITROSTAT) SL tablet 0.4 mg  0.4 mg Sublingual Q5 Min x 3 PRN Roger A Arguello, PA-C      . nitroGLYCERIN 0.2 mg/mL in dextrose 5 % infusion  2-200 mcg/min Intravenous Titrated Laurey Morale, MD 1.5 mL/hr at 06/26/12 1200 5 mcg/min at 06/26/12 1200  . ondansetron (ZOFRAN) injection 4 mg  4 mg Intravenous Q6H PRN [name], MD      . Ticagrelor [facility]) tablet 90 mg  90 mg Oral BID [name], MD   90 mg at 06/26/12 1610  . zolpidem (AMBIEN) tablet 5 mg  5 mg Oral QHS PRN,MR X 1 Roger A Arguello, PA-C        No Known Allergies    Review of Systems:   General:  normal appetite, normal energy, no weight gain, no weight loss, no fever  Cardiac:  + chest pain with  exertion, + chest pain at rest, +SOB with exertion, + resting SOB, + PND, + orthopnea, no palpitations, no arrhythmia, no atrial fibrillation, no LE edema, no dizzy spells, no syncope  Respiratory:  + shortness of breath, no home oxygen, no productive cough, no dry cough, no bronchitis, no wheezing, no hemoptysis, no asthma, no pain with inspiration or cough, + sleep apnea, + CPAP at night  GI:   no difficulty swallowing, no reflux, no frequent heartburn, no hiatal hernia, no abdominal pain, no constipation, no diarrhea, no hematochezia, no hematemesis, no melena  GU:   + occasional dysuria,  no frequency, no urinary tract infection, no hematuria, no enlarged  prostate, no kidney stones, no kidney disease  Vascular:  no pain suggestive of claudication, no pain in feet, no leg cramps, no varicose veins, no DVT, no non-healing foot ulcer  Neuro:   no stroke, no TIA's, no seizures, no headaches, no temporary blindness one eye,  no slurred speech, no peripheral neuropathy, no chronic pain, no instability of gait, no memory/cognitive dysfunction  Musculoskeletal: + arthritis esp in both knees, no joint swelling, no myalgias, no difficulty walking, normal mobility   Skin:   no rash, no itching, no skin infections, no pressure sores or ulcerations  Psych:   no anxiety, no depression, no nervousness, no unusual recent stress  Eyes:   no blurry vision, no floaters, no recent vision changes, + wears glasses or contacts  ENT:   no hearing loss, no loose or painful teeth, no dentures  Hematologic:  no easy bruising, no abnormal bleeding, no clotting disorder, no frequent epistaxis  Endocrine:  + diabetes discovered this admission, does not check CBG's at home     Physical Exam:   BP 149/68  Pulse 83  Temp(Src) 98.7 F (37.1 C) (Oral)  Resp 18  Ht 5\' 5"  (1.651 m)  Wt 86.1 kg (189 lb 13.1 oz)  BMI 31.59 kg/m2  SpO2 96%  General:  Morbidly obese but o/w  well-appearing  HEENT:  Unremarkable   Neck:   no  JVD, no bruits, no adenopathy   Chest:   clear to auscultation, symmetrical breath sounds, no wheezes, no rhonchi   CV:   RRR, no murmur   Abdomen:  soft, non-tender, no masses   Extremities:  warm, well-perfused, pulses diminished, normal Allen's test on L side  Rectal/GU  Deferred  Neuro:   Grossly non-focal and symmetrical throughout  Skin:   Clean and dry, no rashes, no breakdown  Diagnostic Tests:  CARDIAC CATHETERIZATION   History obtained from chart review.57 yo with history of CAD s/p MI about 4 years ago and PCI in [place] presents to ER today with NSTEMI. Patient has been having exertional dyspnea but not chest pain for about a month. The shortness of breath comes with walking more than just a short distance. Last night, he developed a severe headache. The headache resolved after Ibuprofen but he later woke up with substernal chest pressure. This resolved in about 1 hour (he iced his chest) and he went back to sleep. This morning, while making breakfast, the chest pain returned. It was severe 10/10 like an elephant on his chest. He went to the ER and had morphine and NTG sublingual. He has just been started on NTG gtt at 5. CP is down to 4/10 (much impoved but still there). Initial TnI was 0.47. Initial ECG showed slight ST elevation in II but repeat ECG showed that this had basically resolved. Because of a slightly increased troponin and mildly increased chest pain with increasing doses of IV nitroglycerin Dr. Carolanne Grumbling evaluated the patient and felt that the patient should come to the Cath Lab for intervention.  PROCEDURE DESCRIPTION:  The patient was brought to the second floor [facility] cath lab in the postabsorptive state. He was premedicated with Valium 5 mg by mouth, IV Versed and fentanyl.Marland Kitchen His right groin  was prepped and shaved in usual sterile fashion (after failed attempt at radial access). Xylocaine 1% was used for local anesthesia. A 6 French sheath was inserted  into the right common femoral  artery using standard Seldinger technique.6 French right and left  Judkins diagnostic catheters along with a 6 French pigtail catheter were used for selective coronary angiography, left ventriculography, subselective left internal mammary artery angiography and distal abdominal aortography. Visipaque dye was used for the entirety of the case. Retrograde aortic, left ventricular and pullback pressures were recorded.  HEMODYNAMICS:  AO SYSTOLIC/AO DIASTOLIC: 100/70  LV SYSTOLIC/LV DIASTOLIC: 101/18  ANGIOGRAPHIC RESULTS:  1. Left main; normal  2. LAD; 95-99% stenosis at the bifurcation of the first large side branch probably representing "in-stent restenosis with a proximal LAD stent.  3. Left circumflex; nondominant and normal.  4. Right coronary artery; dominant 7580% segmental proximal, 80% mid and a total posterolateral branch representing the "culprit vessel"  5.LIMA was subselectively visualized a widely patent. It was suitable for use during cord artery bypass grafting if necessary  6. Left ventriculography; RAO left ventriculogram was performed using  25 mL of Visipaque dye at 12 mL/second. The overall LVEF estimated  50-55 % With wall motion abnormalities notable for mild inferobasal hypokinesia  7. Abdominal aortography: Renal arteries are widely patent. The infrarenal abdominal aorta and iliac bifurcation were free of significant atherosclerotic changes  IMPRESSION:Mr. Bulow has a totally occluded distal right with high-grade proximal LAD diagonal branch bifurcation disease. He hasn't inferior wall motion abnormalities suggesting that his distal RCA is infarct-related artery. He has already been loaded with Brilenta. Proceed with PCI and stenting of his dominant RCA.  Procedure description: the patient received Angiomax bolus with an ACT of 410. A total of 225 cc of contrast was administered to the patient. Initially a JR 4 with sidehole catheter was used to  perform intervention however this resulted in a linear dissection of the proximal RCA which spiraled down to the midportion. I then switched to a All right guide catheter with sideholes along with an 014/300 cm length Asahi Prowater guidewire. I was able to reenter the true lumen across the total occlusion. A Z-plasty with a 2 mm x 12 mm long apex balloon restoring antegrade flow. I then stented the entire proximal and mid dominant RCA with overlapping Promus Premier drug-eluting stents (30/38, 30/20). I then used a 20 by 18 mm long mini vision bare-metal stent in the distal RCA deployed at 16 atmospheres (2.3 mm) resulting in reduction of a total occlusion to 0% residual. I postdilated the proximal and mid stented segment with a 3.25 x 20 mm donor long noncompliant balloon up to 16 atmospheres. There was excellent flow in the procedure the patient remained hemodynamically stable. Dr. Tyrone Sage was present at the end we discussed future or not revascularization strategies to the LAD diagonal branch.  Final impression: Successful PCI and stenting of the dominant RCA complicated by spiral dissection with 2 overlapping drug-eluting stents proximally and mid and a bare-metal stent distal. The patient will be treated with aspirin and Brilenta. I am dubious that his LAD diagonal branch is percutaneously addressable I suspect he'll need a LIMA to to those vessels. We will have to stop the Brilenta & start Integrilin. We'll review colleagues.[name] MD, [facility]  06/25/2012  11:55 PM       Impression:  Severe two-vessel coronary artery disease with 95-99% proximal stenosis of the left anterior descending coronary artery involving takeoff of the large diagonal branch, and severe RCA disease. The patient presented with an acute inferior wall myocardial infarction associated with 100% occlusion of the posterior lateral branch of the distal right coronary artery. The patient underwent PCI and stenting of the  right coronary artery  and its branches, and this was complicated by spiral dissection of the proximal right coronary artery requiring numerous overlapping stents throughout the entire right coronary artery. At present the patient is clinically stable. I agree that ultimately he would likely best be treated with surgical revascularization given the high-grade proximal left anterior descending coronary artery stenosis that does not appear to be anatomically favorable for treatment using percutaneous approach. The patient has been loaded with Brilinta during his procedure last night, and this was further complicated by significant right groin bleed and hematoma. The obvious question will be timing of surgery.    Plan:  I discussed matters at length with the patient including alternative treatment strategies and the indications and potential benefits of coronary artery bypass grafting. We will continue to follow along with the next few days to see the patient is doing.  I would not plan to consider coronary artery bypass grafting until the patient has been off of Brilinta for a minimum of 7 days.    Salvatore Decent. Cornelius Moras, MD 06/26/2012 7:32 PM  I spent in excess of 90 minutes of time directly involved in the conduct of this consultation.

## 2012-06-26 NOTE — Progress Notes (Signed)
Bleeding now stopped.  Discussed with Dr. Arbie Cookey and will continue to monitor groin for signs of bleeding.  If he rebleeds then will take to the OR.

## 2012-06-26 NOTE — Progress Notes (Signed)
Patient not ready to wear cpap at this time. 

## 2012-06-26 NOTE — Progress Notes (Signed)
UR COMPLETED  

## 2012-06-26 NOTE — Progress Notes (Signed)
Placed patient on Cpap of 8, full face mask, and 30% fi02.  Patient tolerating well.

## 2012-06-26 NOTE — Progress Notes (Signed)
Inpatient Diabetes Program Recommendations  AACE/ADA: New Consensus Statement on Inpatient Glycemic Control (2013)  Target Ranges:  Prepandial:   less than 140 mg/dL      Peak postprandial:   less than 180 mg/dL (1-2 hours)      Critically ill patients:  140 - 180 mg/dL   Reason for Visit: Results for ARSAL, TAPPAN (MRN [id number]) as of 06/26/2012 10:07  Ref. Range 06/25/2012 10:57 06/26/2012 03:55  Glucose Latest Range: 70-99 mg/dL 782 (H) 956 (H)   Note new diagnosis of diabetes.  No previous medications noted.  Awaiting results of A1C.  Note that Novolog correction and meal coverage added today.  Likely would also benefit from basal insulin, Lantus 15 units daily (titrate according to fasting A1C).  Note patient awaiting TCTS consult for possible cardiac surgery.  Will order "Living Well with Diabetes" booklet for patient today and will see patient on 06/27/12 to discuss new diagnosis and A1C results.

## 2012-06-27 ENCOUNTER — Encounter (HOSPITAL_COMMUNITY): Payer: Self-pay | Admitting: Thoracic Surgery (Cardiothoracic Vascular Surgery)

## 2012-06-27 ENCOUNTER — Inpatient Hospital Stay (HOSPITAL_COMMUNITY): Payer: 59

## 2012-06-27 LAB — GLUCOSE, CAPILLARY
Glucose-Capillary: 184 mg/dL — ABNORMAL HIGH (ref 70–99)
Glucose-Capillary: 227 mg/dL — ABNORMAL HIGH (ref 70–99)
Glucose-Capillary: 257 mg/dL — ABNORMAL HIGH (ref 70–99)

## 2012-06-27 LAB — CBC
MCH: 30.2 pg (ref 26.0–34.0)
MCHC: 34.1 g/dL (ref 30.0–36.0)
MCV: 88.7 fL (ref 78.0–100.0)
Platelets: 120 10*3/uL — ABNORMAL LOW (ref 150–400)

## 2012-06-27 LAB — BASIC METABOLIC PANEL
Calcium: 8.9 mg/dL (ref 8.4–10.5)
Creatinine, Ser: 0.8 mg/dL (ref 0.50–1.35)
GFR calc non Af Amer: >90 mL/min (ref >90)
Glucose, Bld: 215 mg/dL — ABNORMAL HIGH (ref 70–99)
Sodium: 132 mEq/L — ABNORMAL LOW (ref 135–145)

## 2012-06-27 MED ORDER — EPTIFIBATIDE 75 MG/100ML IV SOLN
2.0000 ug/kg/min | INTRAVENOUS | Status: DC
  Administered 2012-06-27 – 2012-07-03 (×29): 2 ug/kg/min via INTRAVENOUS
  Filled 2012-06-27 (×55): qty 100

## 2012-06-27 MED ORDER — SODIUM CHLORIDE 0.9 % IV SOLN
INTRAVENOUS | Status: DC
  Administered 2012-06-30: 18:00:00 via INTRAVENOUS
  Administered 2012-07-04: 20 mL via INTRAVENOUS

## 2012-06-27 MED ORDER — INSULIN GLARGINE 100 UNIT/ML ~~LOC~~ SOLN
15.0000 [IU] | Freq: Every day | SUBCUTANEOUS | Status: DC
  Administered 2012-06-27 – 2012-06-28 (×2): 15 [IU] via SUBCUTANEOUS
  Filled 2012-06-27 (×3): qty 0.15

## 2012-06-27 MED ORDER — ALBUTEROL SULFATE (5 MG/ML) 0.5% IN NEBU
2.5000 mg | INHALATION_SOLUTION | Freq: Once | RESPIRATORY_TRACT | Status: AC
  Administered 2012-06-27: 2.5 mg via RESPIRATORY_TRACT

## 2012-06-27 NOTE — Progress Notes (Signed)
PFT completed. Unconfirmed report placed in Progress Notes in Shadow Chart.

## 2012-06-27 NOTE — Progress Notes (Signed)
Patient does not want to wear cpap tonight.  RN aware

## 2012-06-27 NOTE — Progress Notes (Signed)
ANTICOAGULATION CONSULT NOTE - Initial Consult  Pharmacy Consult for Integrelin Indication: Antiplatelet coverage  No Known Allergies  Patient Measurements: Height: 5\' 5"  (165.1 cm) Weight: 265 lb 14 oz (120.6 kg) IBW/kg (Calculated) : 61.5 Heparin Dosing Weight:   Vital Signs: Temp: 97.7 F (36.5 C) (06/03 0800) Temp src: Oral (06/03 0800) BP: 125/39 mmHg (06/03 0800) Pulse Rate: 69 (06/03 0830)  Labs:  Recent Labs  06/25/12 1057  06/25/12 1606 06/25/12 1911 06/26/12 0355 06/27/12 0452  HGB 16.0  --   --   --  14.7 13.9  HCT 44.7  --   --   --  43.6 40.8  PLT 152  --   --   --  162 120*  LABPROT 12.5  --   --   --   --   --   INR 0.94  --   --   --   --   --   CREATININE 0.78  --   --   --  0.90 0.80  TROPONINI  --   < > 0.88* 2.02* 10.01*  --   < > = values in this interval not displayed.  Estimated Creatinine Clearance: 125.6 ml/min (by C-G formula based on Cr of 0.8).   Medical History: Past Medical History  Diagnosis Date  . Coronary artery disease   . MI (myocardial infarction)   . Sleep apnea   . Morbid obesity 06/26/2012    Assessment: 57 y/o male patient admitted with chest pain requiring anticoagulation for NSTEMI. Has known CAD (x 4 years) but had stopped taking his medications.  Anticoag: s/p heparin now with R groin bleed post-cath with soft hematoma 6/2.  Cards: Known CAD with new NSTEMI s/p cath/PCI of RCA with residual proximal LAD/Diag. Disease.Consideration of thoracic surgery consult and single vessel bypass. D/C Brilinta on . CABG after off Brilinta at least 7 days. BP 125/39, HR 69. Meds: ASA 81mg , Lipitor, metoprolol, IV NTG, IV Integrelin  Endo: DM (new). Hgb A1C=10.3. CBG 184-252 on SSI and Lantus  GI/Nutrition: Morbid obesity (BMI 44.2)  Pulm: home CPAP for OSA.  Goal of Therapy:  Antiplatelets with new stents Monitor platelets by anticoagulation protocol: Yes   Plan:  Integrelin (no bolus due to recent groin hematoma) at  75mcg/kg/min. Daily CBC   Serita Degroote S. Merilynn Finland, PharmD, BCPS Clinical Staff Pharmacist Pager [phone]  [name] 06/27/2012,9:31 AM

## 2012-06-27 NOTE — Progress Notes (Signed)
1500 Came to do preop education but pt sleeping. Please address activity when pt stable to walk. We will continue to follow. Luetta Nutting RNBSN

## 2012-06-27 NOTE — Progress Notes (Addendum)
    SUBJECTIVE: No chest pain or SOB. No events.   BP 128/76  Pulse 70  Temp(Src) 98.5 F (36.9 C) (Oral)  Resp 16  Ht 5\' 5"  (1.651 m)  Wt 265 lb 14 oz (120.6 kg)  BMI 44.24 kg/m2  SpO2 97%  Intake/Output Summary (Last 24 hours) at 06/27/12 0752 Last data filed at 06/27/12 0700  Gross per 24 hour  Intake    453 ml  Output   3675 ml  Net  -3222 ml    PHYSICAL EXAM General: Well developed, well nourished, in no acute distress. Alert and oriented x 3.  Psych:  Good affect, responds appropriately Neck: No JVD. No masses noted.  Lungs: Clear bilaterally with no wheezes or rhonci noted.  Heart: RRR with no murmurs noted. Abdomen: Bowel sounds are present. Soft, non-tender.  Extremities: No lower extremity edema. Right groin stable. NO hematoma.   LABS: Basic Metabolic Panel:  Recent Labs  16/10/96 1057 06/25/12 1911 06/26/12 0355 06/27/12 0452  NA 132*  --  132* 132*  K 4.1  --  4.4 4.1  CL 97  --  96 98  CO2 23  --  25 26  GLUCOSE 302*  --  296* 215*  BUN 14  --  16 13  CREATININE 0.78  --  0.90 0.80  CALCIUM 9.4  --  8.7 8.9  MG  --  1.6  --   --    CBC:  Recent Labs  06/26/12 0355 06/27/12 0452  WBC 11.7* 9.4  HGB 14.7 13.9  HCT 43.6 40.8  MCV 88.1 88.7  PLT 162 120*   Cardiac Enzymes:  Recent Labs  06/25/12 1606 06/25/12 1911 06/26/12 0355  TROPONINI 0.88* 2.02* 10.01*   Fasting Lipid Panel:  Recent Labs  06/26/12 0355  CHOL 165  HDL 30*  LDLCALC 86  TRIG 045*  CHOLHDL 5.5    Current Meds: . aspirin  81 mg Oral Daily  . atorvastatin  80 mg Oral q1800  . insulin aspart  0-15 Units Subcutaneous TID WC  . insulin aspart  4 Units Subcutaneous TID WC  . metoprolol tartrate  12.5 mg Oral Q6H  . Ticagrelor  90 mg Oral BID     ASSESSMENT AND PLAN:  1. Acute NSTEMI/CAD: S/p cath/pci of RCA in setting of inferior MI 06/25/12. 2 DES and 1 bare metal stent placed in RCA from ostium to down to distal vessel secondary to vessel dissection  during PCI. Residual high grade stenosis involving the mid LAD at site of previous stent involving large diagonal branch. The mid LAD/Diagonal lesion is not favorable for PCI. Dr. Cornelius Moras with CT surgery has seen the patient and is making plans for bypass of the LAD/Diagonal and potentially RCA for next week. He is currently on Brilinta following PCI and stent placement. Will stop Brilinta today and start Integrilin drip to allow Brilinta washout while awaiting CABG next week. Cont asa, bb, statin.   2. R Groin bleed: Stable.   3. DMII:  Newly Dx. Continue SSI. HgbA1C over 10. Lantus added. Appreciate recs of DM team.   4. OSA: Uses CPAP @ home. Will continue here.   Will transfer to telemetry. I have checked with unit 2000 and they are comfortable with Integrilin drip while awaiting CABG.      Murlin Schrieber  6/3/20147:52 AM

## 2012-06-27 NOTE — Progress Notes (Signed)
Inpatient Diabetes Program Recommendations  AACE/ADA: New Consensus Statement on Inpatient Glycemic Control (2013)  Target Ranges:  Prepandial:   less than 140 mg/dL      Peak postprandial:   less than 180 mg/dL (1-2 hours)      Critically ill patients:  140 - 180 mg/dL   Reason for Visit: Spoke to patient regarding new diagnosis of diabetes.  He states that he has not seen MD in about 4 years.  Discussed results of A1C=10.5% and that average CBG's were 260 mg/dL.  Patient is having CABG on Monday June 10.  Will order diabetes videos prior to surgery.  Unclear what insulin needs will be after surgery.  Patient will likely need at least basal insulin and oral agents at discharge.  Will follow.

## 2012-06-27 NOTE — Progress Notes (Signed)
RT placed patient on cpap auto 11cmH20 max and 4cmH20 via full face mask and 2lpm 02 bleed in. Patient tolerating cpap well at this time.

## 2012-06-28 DIAGNOSIS — Z5189 Encounter for other specified aftercare: Secondary | ICD-10-CM

## 2012-06-28 DIAGNOSIS — I2510 Atherosclerotic heart disease of native coronary artery without angina pectoris: Secondary | ICD-10-CM

## 2012-06-28 DIAGNOSIS — Z01810 Encounter for preprocedural cardiovascular examination: Secondary | ICD-10-CM

## 2012-06-28 LAB — GLUCOSE, CAPILLARY
Glucose-Capillary: 191 mg/dL — ABNORMAL HIGH (ref 70–99)
Glucose-Capillary: 196 mg/dL — ABNORMAL HIGH (ref 70–99)
Glucose-Capillary: 169 mg/dL — ABNORMAL HIGH (ref 70–99)
Glucose-Capillary: 136 mg/dL — ABNORMAL HIGH (ref 70–99)

## 2012-06-28 LAB — BASIC METABOLIC PANEL
CO2: 26 mEq/L (ref 19–32)
Chloride: 102 mEq/L (ref 96–112)
Creatinine, Ser: 0.88 mg/dL (ref 0.50–1.35)
GFR calc Af Amer: >90 mL/min (ref >90)
Potassium: 4.2 mEq/L (ref 3.5–5.1)

## 2012-06-28 LAB — CBC
HCT: 41.4 % (ref 39.0–52.0)
Hemoglobin: 14.2 g/dL (ref 13.0–17.0)
MCV: 88.5 fL (ref 78.0–100.0)
RDW: 14 % (ref 11.5–15.5)
WBC: 9.6 10*3/uL (ref 4.0–10.5)

## 2012-06-28 NOTE — Progress Notes (Signed)
      [address]       [name] [address]             [phone]     CARDIOTHORACIC SURGERY PROGRESS NOTE  3 Days Post-Op  S/P Procedure(s) (LRB): LEFT HEART CATHETERIZATION WITH CORONARY ANGIOGRAM (N/A)  Subjective: No chest pain.  No SOB  Objective: Vital signs in last 24 hours: Temp:  [97.3 F (36.3 C)-99.8 F (37.7 C)] 97.8 F (36.6 C) (06/04 1330) Pulse Rate:  [57-79] 57 (06/04 1330) Cardiac Rhythm:  [-] Normal sinus rhythm (06/04 0711) Resp:  [16-18] 17 (06/04 1330) BP: (123-141)/(68-82) 123/69 mmHg (06/04 1330) SpO2:  [97 %-100 %] 97 % (06/04 1330) Weight:  [122.879 kg (270 lb 14.4 oz)] 122.879 kg (270 lb 14.4 oz) (06/04 0500)  Physical Exam:  Rhythm:   sinus  Breath sounds: clear  Heart sounds:  RRR   Incisions:  n/a  Abdomen:  soft  Extremities:  warm   Intake/Output from previous day: 06/03 0701 - 06/04 0700 In: 503 [P.O.:480; I.V.:23] Out: -  Intake/Output this shift: Total I/O In: 720 [P.O.:720] Out: -   Lab Results:  Recent Labs  06/27/12 0452 06/28/12 0445  WBC 9.4 9.6  HGB 13.9 14.2  HCT 40.8 41.4  PLT 120* 143*   BMET:  Recent Labs  06/27/12 0452 06/28/12 0445  NA 132* 136  K 4.1 4.2  CL 98 102  CO2 26 26  GLUCOSE 215* 211*  BUN 13 13  CREATININE 0.80 0.88  CALCIUM 8.9 8.9    CBG (last 3)   Recent Labs  06/27/12 2034 06/28/12 0723 06/28/12 1204  GLUCAP 237* 191* 196*   PT/INR:  No results found for this basename: LABPROT, INR,  in the last 72 hours  CXR:  *RADIOLOGY REPORT*  Clinical Data: Chest pain, shortness of breath  CHEST - 2 VIEW  Comparison: None.  Findings: Mild cardiomegaly. Lungs clear. No effusion. Regional  bones unremarkable.  IMPRESSION:  There are mild cardiomegaly  Original Report Authenticated By: D. Andria Rhein, MD   Assessment/Plan: S/P Procedure(s) (LRB): LEFT HEART CATHETERIZATION WITH CORONARY ANGIOGRAM (N/A)  Tentatively for CABG next Tuesday  OWEN,CLARENCE  H 06/28/2012 5:36 PM

## 2012-06-28 NOTE — Progress Notes (Signed)
VASCULAR LAB PRELIMINARY  PRELIMINARY  PRELIMINARY  PRELIMINARY  Pre-op Cardiac Surgery  Carotid Findings:  Bilateral:  No evidence of hemodynamically significant internal carotid artery stenosis.   Vertebral artery flow is antegrade.     Upper Extremity Right Left  Brachial Pressures 120 Triphasic 134 Triphasic  Radial Waveforms Triphasic Triphasic  Ulnar Waveforms Triphasic Triphasic  Palmar Arch (Allen's Test) Normal Abnormal    Findings:  Doppler waveforms on the right remained normal with both radial and ulnar compressions. Left Doppler waveforms diminished to near obliteration with radial compression and remained normal with ulnar compression    Lower  Extremity Right Left  Dorsalis Pedis 179 triphasic 167 Triphasic      Posterior Tibial 167 Triphasic 163 Triphasic  Ankle/Brachial Indices 1.34 1.25    Findings:  ABIs and Doppler waveforms were within normal limits bilaterally at rest.   Zareena Willis, RVS 06/28/2012, 7:30 PM

## 2012-06-28 NOTE — Progress Notes (Signed)
SUBJECTIVE: No chest pain or SOB. No events. Right groin sore.   BP 127/82  Pulse 59  Temp(Src) 97.3 F (36.3 C) (Oral)  Resp 18  Ht 5\' 5"  (1.651 m)  Wt 270 lb 14.4 oz (122.879 kg)  BMI 45.08 kg/m2  SpO2 100%  Intake/Output Summary (Last 24 hours) at 06/28/12 0704 Last data filed at 06/27/12 1700  Gross per 24 hour  Intake    503 ml  Output      0 ml  Net    503 ml    PHYSICAL EXAM General: Well developed, well nourished, in no acute distress. Alert and oriented x 3.  Psych:  Good affect, responds appropriately Neck: No JVD. No masses noted.  Lungs: Clear bilaterally with no wheezes or rhonci noted.  Heart: RRR with no murmurs noted. Abdomen: Bowel sounds are present. Soft, non-tender.  Extremities: No lower extremity edema. Soft hematoma right groin. Painful to palpation.   LABS: Basic Metabolic Panel:  Recent Labs  09/81/19 1057 06/25/12 1911  06/27/12 0452 06/28/12 0445  NA 132*  --   < > 132* 136  K 4.1  --   < > 4.1 4.2  CL 97  --   < > 98 102  CO2 23  --   < > 26 26  GLUCOSE 302*  --   < > 215* 211*  BUN 14  --   < > 13 13  CREATININE 0.78  --   < > 0.80 0.88  CALCIUM 9.4  --   < > 8.9 8.9  MG  --  1.6  --   --   --   < > = values in this interval not displayed. CBC:  Recent Labs  06/27/12 0452 06/28/12 0445  WBC 9.4 9.6  HGB 13.9 14.2  HCT 40.8 41.4  MCV 88.7 88.5  PLT 120* 143*   Cardiac Enzymes:  Recent Labs  06/25/12 1606 06/25/12 1911 06/26/12 0355  TROPONINI 0.88* 2.02* 10.01*   Fasting Lipid Panel:  Recent Labs  06/26/12 0355  CHOL 165  HDL 30*  LDLCALC 86  TRIG 147*  CHOLHDL 5.5    Current Meds: . aspirin  81 mg Oral Daily  . atorvastatin  80 mg Oral q1800  . insulin aspart  0-15 Units Subcutaneous TID WC  . insulin aspart  4 Units Subcutaneous TID WC  . insulin glargine  15 Units Subcutaneous Daily  . metoprolol tartrate  12.5 mg Oral Q6H     ASSESSMENT AND PLAN:  1. Acute NSTEMI/CAD: Pt admitted 06/25/12  by Dr. Shirlee Latch. S/p cath/pci of RCA in setting of inferior MI 06/25/12 per Dr. Allyson Sabal.  2 DES and 1 bare metal stent placed in RCA from ostium  down to distal vessel secondary to vessel dissection during PCI. Residual high grade stenosis involving the mid LAD at site of previous stent involving large diagonal branch. The mid LAD/Diagonal lesion is not favorable for PCI. Dr. Cornelius Moras with CT surgery has seen the patient and is making plans for bypass of the LAD/Diagonal and potentially RCA next week. Brilinta was loaded before the cath and continue through Monday night 06/26/12 then stopped on 06/27/12. He is currently on an Integrilin drip while allowing time for Brilinta to washout while awaiting CABG next week. Cont ASA, beta blocker, statin.   2. R Groin bleed: Stable. H/H is ok. No change in soft hematoma right groin.   3. DMII: New diagnosis. HgbA1C over 10. Continue SSI. Lantus  added. Appreciate recs of DM team.   4. OSA: Uses CPAP @ home. Will continue here.   Romanda Turrubiates  6/4/20147:04 AM

## 2012-06-28 NOTE — Progress Notes (Signed)
CARDIAC REHAB PHASE I   PRE:  Rate/Rhythm: 60 SR  BP:  Supine: 120/90  Sitting:   Standing:    SaO2:   MODE:  Ambulation: 550 ft   POST:  Rate/Rhythm: 75 SR  BP:  Supine:   Sitting: 122/82  Standing:    SaO2:  1030-1105 Pt tolerated ambulation well without c/o of cp or SOB, does c/o of right  groin site tender. Pt back to bed after walk. Gave pt going for OHS booklet and encouraged him to watch pre-op video on TV. We discussed sternal precautions with getting in and out of bed and chair. Also discussed use of IS. We will continue to follow pt.  Melina Copa RN 06/28/2012 11:52 AM

## 2012-06-28 NOTE — Progress Notes (Signed)
ANTICOAGULATION CONSULT NOTE - Follow Up Consult  Pharmacy Consult for Integrelin  Indication: Antiplatelet coverage  No Known Allergies  Patient Measurements: Height: 5\' 5"  (165.1 cm) Weight: 270 lb 14.4 oz (122.879 kg) IBW/kg (Calculated) : 61.5  Vital Signs: Temp: 97.3 F (36.3 C) (06/04 0500) BP: 127/82 mmHg (06/04 0500) Pulse Rate: 59 (06/04 0500)    Recent Labs  06/25/12 1606 06/25/12 1911  06/26/12 0355 06/27/12 0452 06/28/12 0445  HGB  --   --   < > 14.7 13.9 14.2  HCT  --   --   --  43.6 40.8 41.4  PLT  --   --   --  162 120* 143*  CREATININE  --   --   --  0.90 0.80 0.88  TROPONINI 0.88* 2.02*  --  10.01*  --   --   < > = values in this interval not displayed.  Estimated Creatinine Clearance: 115.5 ml/min (by C-G formula based on Cr of 0.88).   Assessment: 57yo male currently on integrelin to allow Brillinta washout prior to CABG planned for next week. Hgb 14.2, Plts 143. Per RN, no issues with bleeding and no worsening in groin hematoma per RN.   Goal of Therapy:  Monitor platelets by anticoagulation protocol: Yes   Plan:  1) Continue integrelin at 38mcg/kg/min 2) Continue to monitor daily CBC   Benjaman Pott, PharmD, BCPS 06/28/2012   12:59 PM

## 2012-06-29 DIAGNOSIS — I2510 Atherosclerotic heart disease of native coronary artery without angina pectoris: Secondary | ICD-10-CM

## 2012-06-29 LAB — CBC
HCT: 40.3 % (ref 39.0–52.0)
Hemoglobin: 14 g/dL (ref 13.0–17.0)
MCHC: 34.7 g/dL (ref 30.0–36.0)
RBC: 4.53 MIL/uL (ref 4.22–5.81)
WBC: 9 10*3/uL (ref 4.0–10.5)

## 2012-06-29 LAB — GLUCOSE, CAPILLARY

## 2012-06-29 MED ORDER — INSULIN GLARGINE 100 UNIT/ML ~~LOC~~ SOLN
20.0000 [IU] | Freq: Every day | SUBCUTANEOUS | Status: DC
  Administered 2012-06-29 – 2012-07-03 (×5): 20 [IU] via SUBCUTANEOUS
  Filled 2012-06-29 (×7): qty 0.2

## 2012-06-29 MED ORDER — SODIUM CHLORIDE 0.9 % IJ SOLN
10.0000 mL | INTRAMUSCULAR | Status: DC | PRN
  Administered 2012-06-30 – 2012-07-02 (×3): 10 mL

## 2012-06-29 NOTE — Progress Notes (Signed)
piccPeripherally Inserted Central Catheter/Midline Placement  The IV Nurse has discussed with the patient and/or persons authorized to consent for the patient, the purpose of this procedure and the potential benefits and risks involved with this procedure.  The benefits include less needle sticks, lab draws from the catheter and patient may be discharged home with the catheter.  Risks include, but not limited to, infection, bleeding, blood clot (thrombus formation), and puncture of an artery; nerve damage and irregular heat beat.  Alternatives to this procedure were also discussed.  PICC/Midline Placement Documentation        Vevelyn Pat 06/29/2012, 3:02 PM

## 2012-06-29 NOTE — Progress Notes (Addendum)
Patient ID: Barry [name], male   DOB: 1955/04/23, 57 y.o.   MRN: [id number]    SUBJECTIVE: No chest pain or SOB. No events.   BP 92/47  Pulse 59  Temp(Src) 97.3 F (36.3 C) (Oral)  Resp 18  Ht 5\' 5"  (1.651 m)  Wt 269 lb 6.4 oz (122.199 kg)  BMI 44.83 kg/m2  SpO2 92%  Intake/Output Summary (Last 24 hours) at 06/28/12 0704 Last data filed at 06/27/12 1700  Gross per 24 hour  Intake    503 ml  Output      0 ml  Net    503 ml    PHYSICAL EXAM General: Well developed, well nourished, in no acute distress. Alert and oriented x 3.  Obese.  Psych:  Good affect, responds appropriately Neck: No JVD. No masses noted.  Lungs: Clear bilaterally with no wheezes or rhonci noted.  Heart: RRR with no murmurs noted. Abdomen: Bowel sounds are present. Soft, non-tender.  Extremities: No lower extremity edema. Soft hematoma right groin. Painful to palpation.   LABS: BMET    Component Value Date/Time   NA 136 06/28/2012 0445   K 4.2 06/28/2012 0445   CL 102 06/28/2012 0445   CO2 26 06/28/2012 0445   GLUCOSE 211* 06/28/2012 0445   BUN 13 06/28/2012 0445   CREATININE 0.88 06/28/2012 0445   CALCIUM 8.9 06/28/2012 0445   GFRNONAA >90 06/28/2012 0445   GFRAA >90 06/28/2012 0445    CBC    Component Value Date/Time   WBC 9.0 06/29/2012 0451   RBC 4.53 06/29/2012 0451   HGB 14.0 06/29/2012 0451   HCT 40.3 06/29/2012 0451   PLT 144* 06/29/2012 0451   MCV 89.0 06/29/2012 0451   MCH 30.9 06/29/2012 0451   MCHC 34.7 06/29/2012 0451   RDW 13.9 06/29/2012 0451      Cardiac Enzymes:  Recent Labs  06/25/12 1606 06/25/12 1911 06/26/12 0355  TROPONINI 0.88* 2.02* 10.01*   Fasting Lipid Panel:  Recent Labs  06/26/12 0355  CHOL 165  HDL 30*  LDLCALC 86  TRIG 409*  CHOLHDL 5.5   . aspirin  81 mg Oral Daily  . atorvastatin  80 mg Oral q1800  . insulin aspart  0-15 Units Subcutaneous TID WC  . insulin aspart  4 Units Subcutaneous TID WC  . insulin glargine  20 Units Subcutaneous Daily  . metoprolol tartrate  12.5  mg Oral Q6H  integrilin gtt     ASSESSMENT AND PLAN:  1. Acute NSTEMI/CAD: Pt admitted 06/25/12. S/p cath/pci of RCA in setting of inferior MI 06/25/12 per Dr. Allyson Sabal.  2 DES and 1 bare metal stent placed in RCA from ostium  down to distal vessel secondary to vessel dissection during PCI. Residual high grade stenosis involving the mid LAD at site of previous stent involving large diagonal branch. The mid LAD/Diagonal lesion is not favorable for PCI. Dr. Cornelius Moras with CT surgery has seen the patient and is making plans for bypass of the LAD/Diagonal and potentially RCA next week. Brilinta was loaded before the cath and continue through Monday night 06/26/12 then stopped on 06/27/12. He is currently on an Integrilin drip while allowing time for Brilinta to washout while awaiting CABG Tuesday. Cont ASA, beta blocker, statin.  Will get echo today to assess LV function, EF 50-55% on LV-gram.  2. R Groin bleed: Stable. H/H is ok. No change in soft hematoma right groin.   3. DMII: New diagnosis. HgbA1C over 10. Continue SSI. Lantus  added,increase to 20 units daily today. Appreciate recs of DM team.   4. OSA: Uses CPAP @ home. Will continue here.   Marca Ancona 06/29/2012

## 2012-06-29 NOTE — Progress Notes (Signed)
1010 Came to see pt. States he is waiting for IV to be replaced. Will not walk pt at this time due to no IV access. Will follow up.Luetta Nutting RNBSN

## 2012-06-29 NOTE — Progress Notes (Signed)
1340 Came to walk. Pt still awaiting IV access. To get PICC line. Will follow up. Tomekia Helton DunlapRNBSN

## 2012-06-29 NOTE — Progress Notes (Signed)
MEDICATION RELATED CONSULT NOTE - FOLLOW UP   Pharmacy Re: Integrilin Indication: PCI with stent awaiting CABG  Assessment: 57yo male with CAD awaiting CABG.  He was continued on IV Integrilin but this was stopped due to IV infiltrate at 07:57AM.  IV therapy team attempted to restart but could not and he will need a PICC placed.    Plan:  1.  Will f/u for Integrilin restart 2.  May want to consider SQ lovenox at least prophylactic doses until CABG.  Nadara Mustard, PharmD., MS Clinical Pharmacist Pager:  [phone] Thank you for allowing pharmacy to be part of this patients care team. 06/29/2012,1:56 PM   No Known Allergies  Patient Measurements: Height: 5\' 5"  (165.1 cm) Weight: 269 lb 6.4 oz (122.199 kg) IBW/kg (Calculated) : 61.5  Vital Signs: Temp: 97.9 F (36.6 C) (06/05 1351) BP: 115/75 mmHg (06/05 1351) Pulse Rate: 69 (06/05 1351) Intake/Output from previous day: 06/04 0701 - 06/05 0700 In: 1080 [P.O.:1080] Out: -  Intake/Output from this shift: Total I/O In: 600 [P.O.:600] Out: -   Labs:  Recent Labs  06/27/12 0452 06/28/12 0445 06/29/12 0451  WBC 9.4 9.6 9.0  HGB 13.9 14.2 14.0  HCT 40.8 41.4 40.3  PLT 120* 143* 144*  CREATININE 0.80 0.88  --    Estimated Creatinine Clearance: 115.1 ml/min (by C-G formula based on Cr of 0.88).   Microbiology: Recent Results (from the past 720 hour(s))  MRSA PCR SCREENING     Status: None   Collection Time    06/25/12  6:37 PM      Result Value Range Status   MRSA by PCR NEGATIVE  NEGATIVE Final   Comment:            The GeneXpert MRSA Assay (FDA     approved for NASAL specimens     only), is one component of a     comprehensive MRSA colonization     surveillance program. It is not     intended to diagnose MRSA     infection nor to guide or     monitor treatment for     MRSA infections.

## 2012-06-29 NOTE — Progress Notes (Signed)
      [address]       [name] [address]             [phone]     CARDIOTHORACIC SURGERY PROGRESS NOTE  4 Days Post-Op  S/P Procedure(s) (LRB): LEFT HEART CATHETERIZATION WITH CORONARY ANGIOGRAM (N/A)  Subjective: No chest pain.  No SOB  Objective: Vital signs in last 24 hours: Temp:  [97.3 F (36.3 C)-98.4 F (36.9 C)] 97.3 F (36.3 C) (06/05 0500) Pulse Rate:  [57-64] 59 (06/05 0500) Cardiac Rhythm:  [-] Normal sinus rhythm (06/05 0813) Resp:  [17-18] 18 (06/05 0500) BP: (92-123)/(47-87) 92/47 mmHg (06/05 0500) SpO2:  [92 %-98 %] 92 % (06/05 0500) Weight:  [122.199 kg (269 lb 6.4 oz)] 122.199 kg (269 lb 6.4 oz) (06/05 0500)  Physical Exam:  Rhythm:   sinus  Breath sounds: clear  Heart sounds:  RRR  Incisions:  n/a  Abdomen:  soft  Extremities:  warm   Intake/Output from previous day: 06/04 0701 - 06/05 0700 In: 1080 [P.O.:1080] Out: -  Intake/Output this shift: Total I/O In: 360 [P.O.:360] Out: -   Lab Results:  Recent Labs  06/28/12 0445 06/29/12 0451  WBC 9.6 9.0  HGB 14.2 14.0  HCT 41.4 40.3  PLT 143* 144*   BMET:  Recent Labs  06/27/12 0452 06/28/12 0445  NA 132* 136  K 4.1 4.2  CL 98 102  CO2 26 26  GLUCOSE 215* 211*  BUN 13 13  CREATININE 0.80 0.88  CALCIUM 8.9 8.9    CBG (last 3)   Recent Labs  06/28/12 1731 06/28/12 2201 06/29/12 0739  GLUCAP 169* 136* 178*   PT/INR:  No results found for this basename: LABPROT, INR,  in the last 72 hours  CXR:  N/A  Assessment/Plan: S/P Procedure(s) (LRB): LEFT HEART CATHETERIZATION WITH CORONARY ANGIOGRAM (N/A)  Clinically stable Tentatively for CABG next Tuesday I will f/u again on Monday, but please call if problems or questions arise  Barry [name] H 06/29/2012 10:55 AM

## 2012-06-29 NOTE — Progress Notes (Signed)
Patient placed cpap on himself and is tolerating cpap well at this time.

## 2012-06-29 NOTE — Care Management Note (Unsigned)
    Page 1 of 1   07/07/2012     3:25:01 PM   CARE MANAGEMENT NOTE 07/07/2012  Patient:  Barry [name], Barry [name]   Account Number:  [id number]  Date Initiated:  06/29/2012  Documentation initiated by:  GRAVES-BIGELOW,BRENDA  Subjective/Objective Assessment:   Pt admitted with cp. S/p cath/ pci of RCA in setting of inferior MI 06/25/12 . Brilinta was loaded before the cath and then stopped on 06/27/12. He is currently on an Integrilin gtt for Brilinta to washout- CABG Tuesday.     Action/Plan:   CM will continue to monitor for disposition needs.   Anticipated DC Date:  07/08/2012   Anticipated DC Plan:  SKILLED NURSING FACILITY  In-house referral  Clinical Social Worker      DC Planning Services  CM consult      Choice offered to / List presented to:             Status of service:  In process, will continue to follow Medicare Important Message given?   (If response is "NO", the following Medicare IM given date fields will be blank) Date Medicare IM given:   Date Additional Medicare IM given:    Discharge Disposition:    Per UR Regulation:  Reviewed for med. necessity/level of care/duration of stay  If discussed at Long Length of Stay Meetings, dates discussed:   07/04/2012  07/06/2012    Comments:  07/06/12 Barry [place],RN,BSN [phone] PT REQUESTING SNF AT DC, AS HE HAS NO HOME SUPPORT.  MET WITH PT; INSTRUCTED PT TO WORK ON POSSIBLE BACKUP PLAN FOR SNF IF POSSIBLE, AS INSURANCE COULD DECLINE SNF STAY.  HE UNDERSTANDS THIS; STATES HE WILL TRY TO COME UP WITH SOMEONE TO [facility].  07-03-12 [address], [place] [phone] Plan for CABG 07-04-12. CM will continue to monitor for disposition needs.

## 2012-06-29 NOTE — Progress Notes (Signed)
Patient places CPAP on himself. RT will monitor.

## 2012-06-30 DIAGNOSIS — I517 Cardiomegaly: Secondary | ICD-10-CM

## 2012-06-30 LAB — CBC
HCT: 40.5 % (ref 39.0–52.0)
Hemoglobin: 13.8 g/dL (ref 13.0–17.0)
MCH: 30 pg (ref 26.0–34.0)
MCHC: 34.1 g/dL (ref 30.0–36.0)
RBC: 4.6 MIL/uL (ref 4.22–5.81)

## 2012-06-30 LAB — GLUCOSE, CAPILLARY
Glucose-Capillary: 169 mg/dL — ABNORMAL HIGH (ref 70–99)
Glucose-Capillary: 125 mg/dL — ABNORMAL HIGH (ref 70–99)

## 2012-06-30 LAB — BASIC METABOLIC PANEL
BUN: 11 mg/dL (ref 6–23)
CO2: 28 mEq/L (ref 19–32)
Calcium: 9.1 mg/dL (ref 8.4–10.5)
GFR calc non Af Amer: >90 mL/min (ref >90)
Glucose, Bld: 170 mg/dL — ABNORMAL HIGH (ref 70–99)

## 2012-06-30 MED ORDER — HYDROCORTISONE 1 % EX CREA
TOPICAL_CREAM | CUTANEOUS | Status: DC | PRN
  Filled 2012-06-30: qty 28

## 2012-06-30 NOTE — Progress Notes (Signed)
CARDIAC REHAB PHASE I   PRE:  Rate/Rhythm: 60 SR    BP: sitting 100/60    SaO2:   MODE:  Ambulation: 550 ft   POST:  Rate/Rhythm: 79 SR    BP: sitting 130/70     SaO2:   Tolerated well. Feels good walking, thankful to be up. To watch video and read book this weekend. [phone]   Elissa Lovett Quitman CES, ACSM 06/30/2012 3:37 PM

## 2012-06-30 NOTE — Progress Notes (Signed)
Patient ID: Barry [name], male   DOB: Dec 06, 1955, 57 y.o.   MRN: [id number]    SUBJECTIVE: No chest pain or SOB. PICC placed due to difficulty with access.   BP 114/75  Pulse 60  Temp(Src) 98.3 F (36.8 C) (Oral)  Resp 18  Ht 5\' 5"  (1.651 m)  Wt 269 lb 6.4 oz (122.199 kg)  BMI 44.83 kg/m2  SpO2 96%  Intake/Output Summary (Last 24 hours) at 06/28/12 0704 Last data filed at 06/27/12 1700  Gross per 24 hour  Intake    503 ml  Output      0 ml  Net    503 ml    PHYSICAL EXAM General: Well developed, well nourished, in no acute distress. Alert and oriented x 3.  Obese.  Psych:  Good affect, responds appropriately Neck: Thick, no JVD. No masses noted.  Lungs: Clear bilaterally with no wheezes or rhonci noted.  Heart: RRR with no murmurs noted. Abdomen: Bowel sounds are present. Soft, non-tender.  Extremities: No lower extremity edema. Soft hematoma right groin. Painful to palpation.   LABS: BMET    Component Value Date/Time   NA 137 06/30/2012 0500   K 4.1 06/30/2012 0500   CL 103 06/30/2012 0500   CO2 28 06/30/2012 0500   GLUCOSE 170* 06/30/2012 0500   BUN 11 06/30/2012 0500   CREATININE 0.84 06/30/2012 0500   CALCIUM 9.1 06/30/2012 0500   GFRNONAA >90 06/30/2012 0500   GFRAA >90 06/30/2012 0500    CBC    Component Value Date/Time   WBC 8.5 06/30/2012 0500   RBC 4.60 06/30/2012 0500   HGB 13.8 06/30/2012 0500   HCT 40.5 06/30/2012 0500   PLT 164 06/30/2012 0500   MCV 88.0 06/30/2012 0500   MCH 30.0 06/30/2012 0500   MCHC 34.1 06/30/2012 0500   RDW 13.8 06/30/2012 0500      Cardiac Enzymes:  Recent Labs  06/25/12 1606 06/25/12 1911 06/26/12 0355  TROPONINI 0.88* 2.02* 10.01*   Fasting Lipid Panel:  Recent Labs  06/26/12 0355  CHOL 165  HDL 30*  LDLCALC 86  TRIG 409*  CHOLHDL 5.5   . aspirin  81 mg Oral Daily  . atorvastatin  80 mg Oral q1800  . insulin aspart  0-15 Units Subcutaneous TID WC  . insulin aspart  4 Units Subcutaneous TID WC  . insulin glargine  20 Units  Subcutaneous Daily  . metoprolol tartrate  12.5 mg Oral Q6H  integrilin gtt     ASSESSMENT AND PLAN:  1. Acute NSTEMI/CAD: Pt admitted 06/25/12. S/p cath/pci of RCA in setting of inferior MI 06/25/12 per Dr. Allyson Sabal.  2 DES and 1 bare metal stent placed in RCA from ostium  down to distal vessel secondary to vessel dissection during PCI. Residual high grade stenosis involving the mid LAD at site of previous stent involving large diagonal branch. The mid LAD/Diagonal lesion is not favorable for PCI. Dr. Cornelius Moras with CT surgery has seen the patient and is making plans for bypass of the LAD/Diagonal and potentially RCA next week. Brilinta was loaded before the cath and continue through Monday night 06/26/12 then stopped on 06/27/12. He is currently on an Integrilin drip while allowing time for Brilinta to washout while awaiting CABG next Tuesday. Cont ASA, beta blocker, statin.  Awaiting echo today to assess LV function, EF 50-55% on LV-gram.  2. R Groin bleed: Stable. H/H is ok.   3. DMII: New diagnosis. HgbA1C over 10. Continue SSI. Lantus added.  Appreciate recs of DM team.   4. OSA: Uses CPAP @ home. Will continue here.   Marca Ancona 06/30/2012

## 2012-06-30 NOTE — Progress Notes (Signed)
ANTICOAGULATION CONSULT NOTE - Follow Up Consult  Pharmacy Consult for Integrilin Indication: PCI with stent awaiting CABG  No Known Allergies  Patient Measurements: Height: 5\' 5"  (165.1 cm) Weight: 269 lb 6.4 oz (122.199 kg) IBW/kg (Calculated) : 61.5  Vital Signs: Temp: 98.3 F (36.8 C) (06/06 0512) Temp src: Oral (06/06 0512) BP: 114/75 mmHg (06/06 0512) Pulse Rate: 60 (06/06 0512)  Labs:  Recent Labs  06/28/12 0445 06/29/12 0451 06/30/12 0500  HGB 14.2 14.0 13.8  HCT 41.4 40.3 40.5  PLT 143* 144* 164  CREATININE 0.88  --  0.84    Estimated Creatinine Clearance: 120.6 ml/min (by C-G formula based on Cr of 0.84).   Medications:  Integrilin @ 80mcg/kg/min  Assessment: 57yom resumed on integrilin yesterday evening after PICC placement. He will continue on integrilin while allowing for brilinta washout (last dose 6/2 @ 2256) while awaiting CABG Tuesday. CBC is stable. No bleeding reported - right groin hematoma stable.  Goal of Therapy:  Monitor platelets by anticoagulation protocol: Yes   Plan:  1) Continue integrilin @ 44mcg/kg/min 2) Follow up CBC in AM  Fredrik Rigger 06/30/2012,8:29 AM

## 2012-07-01 LAB — CBC
HCT: 41.2 % (ref 39.0–52.0)
MCV: 88.4 fL (ref 78.0–100.0)
Platelets: 168 10*3/uL (ref 150–400)
RBC: 4.66 MIL/uL (ref 4.22–5.81)
RDW: 13.7 % (ref 11.5–15.5)
WBC: 8.3 10*3/uL (ref 4.0–10.5)

## 2012-07-01 LAB — GLUCOSE, CAPILLARY
Glucose-Capillary: 184 mg/dL — ABNORMAL HIGH (ref 70–99)
Glucose-Capillary: 168 mg/dL — ABNORMAL HIGH (ref 70–99)
Glucose-Capillary: 128 mg/dL — ABNORMAL HIGH (ref 70–99)
Glucose-Capillary: 120 mg/dL — ABNORMAL HIGH (ref 70–99)

## 2012-07-01 NOTE — Progress Notes (Signed)
ANTICOAGULATION CONSULT NOTE - Follow Up Consult  Pharmacy Consult for Integrilin Indication: PCI with stent awaiting CABG  No Known Allergies  Patient Measurements: Height: 5\' 5"  (165.1 cm) Weight: 267 lb 8 oz (121.337 kg) IBW/kg (Calculated) : 61.5  Vital Signs: Temp: 97.6 F (36.4 C) (06/07 0439) Temp src: Oral (06/07 0439) BP: 130/82 mmHg (06/07 0439) Pulse Rate: 60 (06/07 0439)  Labs:  Recent Labs  06/29/12 0451 06/30/12 0500 07/01/12 0535  HGB 14.0 13.8 14.0  HCT 40.3 40.5 41.2  PLT 144* 164 168  CREATININE  --  0.84  --     Estimated Creatinine Clearance: 120 ml/min (by C-G formula based on Cr of 0.84).   Medications:  Integrilin @ 55mcg/kg/min  Assessment: 57yom resumed on integrilin 6/5 after PICC placement. He continues on integrilin while allowing for brilinta washout (last dose 6/2 @ 2256) while awaiting CABG Tuesday. CBC is stable. No bleeding reported - right groin hematoma stable.  Goal of Therapy:  Monitor platelets by anticoagulation protocol: Yes   Plan:  1) Continue integrilin @ 37mcg/kg/min 2) Follow up CBC in AM  Fredrik Rigger 07/01/2012,7:33 AM

## 2012-07-01 NOTE — Progress Notes (Signed)
Pt stated he will place himself on CPAP at bedtime.

## 2012-07-01 NOTE — Progress Notes (Signed)
   Subjective:  The patient has been feeling well.  Chest pain or shortness of breath.  On Integrilin drip.  Walking without difficulty.  Objective:  Vital Signs in the last 24 hours: Temp:  [97.6 F (36.4 C)-98.2 F (36.8 C)] 97.6 F (36.4 C) (06/07 0439) Pulse Rate:  [57-65] 60 (06/07 0439) Resp:  [18] 18 (06/07 0439) BP: (112-135)/(68-93) 130/82 mmHg (06/07 0439) SpO2:  [94 %-96 %] 96 % (06/07 0439) Weight:  [267 lb 8 oz (121.337 kg)] 267 lb 8 oz (121.337 kg) (06/07 0439)  Intake/Output from previous day: 06/06 0701 - 06/07 0700 In: 720 [P.O.:720] Out: 1300 [Urine:1300] Intake/Output from this shift:    . aspirin  81 mg Oral Daily  . atorvastatin  80 mg Oral q1800  . insulin aspart  0-15 Units Subcutaneous TID WC  . insulin aspart  4 Units Subcutaneous TID WC  . insulin glargine  20 Units Subcutaneous Daily  . metoprolol tartrate  12.5 mg Oral Q6H   . sodium chloride 10 mL/hr at 06/30/12 1745  . eptifibatide 2 mcg/kg/min (07/01/12 0847)    Physical Exam: The patient appears to be in no distress.  Head and neck exam reveals that the pupils are equal and reactive.  The extraocular movements are full.  There is no scleral icterus.  Mouth and pharynx are benign.  No lymphadenopathy.  No carotid bruits.  The jugular venous pressure is normal.  Thyroid is not enlarged or tender.  Chest is clear to percussion and auscultation.  No rales or rhonchi.  Expansion of the chest is symmetrical.  Heart reveals no abnormal lift or heave.  First and second heart sounds are normal.  There is no murmur gallop rub or click.  The abdomen is soft and nontender.  Bowel sounds are normoactive.  There is no hepatosplenomegaly or mass.  There are no abdominal bruits.  Extremities reveal no phlebitis or edema.  Pedal pulses are good.  There is no cyanosis or clubbing.  Neurologic exam is normal strength and no lateralizing weakness.  No sensory deficits.  Integument reveals no rash  Lab  Results:  Recent Labs  06/30/12 0500 07/01/12 0535  WBC 8.5 8.3  HGB 13.8 14.0  PLT 164 168    Recent Labs  06/30/12 0500  NA 137  K 4.1  CL 103  CO2 28  GLUCOSE 170*  BUN 11  CREATININE 0.84   No results found for this basename: TROPONINI, CK, MB,  in the last 72 hours Hepatic Function Panel No results found for this basename: PROT, ALBUMIN, AST, ALT, ALKPHOS, BILITOT, BILIDIR, IBILI,  in the last 72 hours No results found for this basename: CHOL,  in the last 72 hours No results found for this basename: PROTIME,  in the last 72 hours  Imaging: Imaging results have been reviewed  Cardiac Studies: Telemetry personally reviewed.  Rhythm is normal sinus rhythm. Assessment/Plan:  Ischemic heart disease with acute non-STEMI.  Awaiting CABG on Tuesday.  Brilinta washout in progress  Continue current regimen  LOS: 6 days    Barry [name] 07/01/2012, 10:22 AM

## 2012-07-02 LAB — CBC
Hemoglobin: 13.8 g/dL (ref 13.0–17.0)
Platelets: 162 10*3/uL (ref 150–400)
RBC: 4.62 MIL/uL (ref 4.22–5.81)
WBC: 7.9 10*3/uL (ref 4.0–10.5)

## 2012-07-02 LAB — GLUCOSE, CAPILLARY
Glucose-Capillary: 120 mg/dL — ABNORMAL HIGH (ref 70–99)
Glucose-Capillary: 189 mg/dL — ABNORMAL HIGH (ref 70–99)

## 2012-07-02 NOTE — Progress Notes (Signed)
   Subjective:  No new problems overnight. No  Chest pain.  Tolerating walking in hall. On integrilin drip.  Objective:  Vital Signs in the last 24 hours: Temp:  [97.9 F (36.6 C)-98.5 F (36.9 C)] 98.1 F (36.7 C) (06/08 0500) Pulse Rate:  [58-71] 68 (06/08 0500) Resp:  [18] 18 (06/08 0500) BP: (108-127)/(70-78) 108/71 mmHg (06/08 0500) SpO2:  [96 %-99 %] 99 % (06/08 0500)  Intake/Output from previous day: 06/07 0701 - 06/08 0700 In: 692.3 [P.O.:480; I.V.:212.3] Out: 750 [Urine:750] Intake/Output from this shift: Total I/O In: 240 [P.O.:240] Out: -   . aspirin  81 mg Oral Daily  . atorvastatin  80 mg Oral q1800  . insulin aspart  0-15 Units Subcutaneous TID WC  . insulin aspart  4 Units Subcutaneous TID WC  . insulin glargine  20 Units Subcutaneous Daily  . metoprolol tartrate  12.5 mg Oral Q6H   . sodium chloride 10 mL/hr at 06/30/12 1745  . eptifibatide 2 mcg/kg/min (07/02/12 0528)    Physical Exam: The patient appears to be in no distress.  Head and neck exam reveals that the pupils are equal and reactive.  The extraocular movements are full.  There is no scleral icterus.  Mouth and pharynx are benign.  No lymphadenopathy.  No carotid bruits.  The jugular venous pressure is normal.  Thyroid is not enlarged or tender.  Chest is clear to percussion and auscultation.  No rales or rhonchi.  Expansion of the chest is symmetrical.  Heart reveals no abnormal lift or heave.  First and second heart sounds are normal.  There is no murmur gallop rub or click.  The abdomen is soft and nontender.  Bowel sounds are normoactive.  There is no hepatosplenomegaly or mass.  There are no abdominal bruits.  Extremities reveal no phlebitis or edema.  Pedal pulses are good.  There is no cyanosis or clubbing.  Neurologic exam is normal strength and no lateralizing weakness.  No sensory deficits.  Integument reveals no rash  Lab Results:  Recent Labs  07/01/12 0535 07/02/12 0545    WBC 8.3 7.9  HGB 14.0 13.8  PLT 168 162    Recent Labs  06/30/12 0500  NA 137  K 4.1  CL 103  CO2 28  GLUCOSE 170*  BUN 11  CREATININE 0.84   No results found for this basename: TROPONINI, CK, MB,  in the last 72 hours Hepatic Function Panel No results found for this basename: PROT, ALBUMIN, AST, ALT, ALKPHOS, BILITOT, BILIDIR, IBILI,  in the last 72 hours No results found for this basename: CHOL,  in the last 72 hours No results found for this basename: PROTIME,  in the last 72 hours  Imaging: Imaging results have been reviewed  Cardiac Studies: Telemetry personally reviewed.  Rhythm is normal sinus rhythm. Assessment/Plan:  Ischemic heart disease with acute non-STEMI.  Awaiting CABG on Tuesday.  Brilinta washout in progress  Continue current regimen.  Continue walking in hall.  LOS: 7 days    Cassell Clement 07/02/2012, 8:56 AM

## 2012-07-02 NOTE — Progress Notes (Signed)
ANTICOAGULATION CONSULT NOTE - Follow Up Consult  Pharmacy Consult for Integrilin Indication: PCI with stent awaiting CABG  No Known Allergies  Patient Measurements: Height: 5\' 5"  (165.1 cm) Weight: 267 lb 8 oz (121.337 kg) IBW/kg (Calculated) : 61.5  Vital Signs: Temp: 98.1 F (36.7 C) (06/08 0500) BP: 108/71 mmHg (06/08 0500) Pulse Rate: 68 (06/08 0500)  Labs:  Recent Labs  06/30/12 0500 07/01/12 0535 07/02/12 0545  HGB 13.8 14.0 13.8  HCT 40.5 41.2 41.1  PLT 164 168 162  CREATININE 0.84  --   --     Estimated Creatinine Clearance: 120 ml/min (by C-G formula based on Cr of 0.84).   Medications:  Integrilin @ 24mcg/kg/min  Assessment: 57yom resumed on integrilin 6/5 after PICC placement. He continues on integrilin while allowing for brilinta washout (last dose 6/2 @ 2256) while awaiting CABG Tuesday. CBC is stable. No bleeding reported - right groin hematoma stable.  Goal of Therapy:  Monitor platelets by anticoagulation protocol: Yes   Plan:  1) Continue integrilin @ 13mcg/kg/min 2) Follow up CBC in AM  Fredrik Rigger 07/02/2012,9:45 AM

## 2012-07-02 NOTE — Progress Notes (Signed)
Pt places self on and off cpap. Informed pt to call rn if he needed rt help.

## 2012-07-03 ENCOUNTER — Inpatient Hospital Stay (HOSPITAL_COMMUNITY): Payer: 59

## 2012-07-03 DIAGNOSIS — E781 Pure hyperglyceridemia: Secondary | ICD-10-CM

## 2012-07-03 DIAGNOSIS — I2510 Atherosclerotic heart disease of native coronary artery without angina pectoris: Secondary | ICD-10-CM

## 2012-07-03 LAB — CBC
HCT: 40.4 % (ref 39.0–52.0)
Hemoglobin: 13.6 g/dL (ref 13.0–17.0)
MCH: 29.5 pg (ref 26.0–34.0)
MCHC: 33.7 g/dL (ref 30.0–36.0)
MCV: 87.6 fL (ref 78.0–100.0)
RDW: 13.4 % (ref 11.5–15.5)

## 2012-07-03 LAB — URINALYSIS, ROUTINE W REFLEX MICROSCOPIC
Hgb urine dipstick: NEGATIVE
Leukocytes, UA: NEGATIVE
Specific Gravity, Urine: 1.006 (ref 1.005–1.030)
Urobilinogen, UA: 1 mg/dL (ref 0.0–1.0)

## 2012-07-03 LAB — ABO/RH: ABO/RH(D): A POS

## 2012-07-03 LAB — BLOOD GAS, ARTERIAL
Drawn by: 31297
FIO2: 0.21 %
O2 Saturation: 97.2 %
Patient temperature: 98.6
pO2, Arterial: 84.6 mmHg (ref 80.0–100.0)

## 2012-07-03 LAB — TYPE AND SCREEN
ABO/RH(D): A POS
Antibody Screen: NEGATIVE

## 2012-07-03 LAB — GLUCOSE, CAPILLARY: Glucose-Capillary: 146 mg/dL — ABNORMAL HIGH (ref 70–99)

## 2012-07-03 LAB — PROTIME-INR: Prothrombin Time: 13.4 seconds (ref 11.6–15.2)

## 2012-07-03 MED ORDER — BISACODYL 5 MG PO TBEC
5.0000 mg | DELAYED_RELEASE_TABLET | Freq: Once | ORAL | Status: AC
  Administered 2012-07-03: 5 mg via ORAL
  Filled 2012-07-03: qty 1

## 2012-07-03 MED ORDER — HEPARIN SODIUM (PORCINE) 1000 UNIT/ML IJ SOLN
INTRAMUSCULAR | Status: DC
  Filled 2012-07-03: qty 30

## 2012-07-03 MED ORDER — CHLORHEXIDINE GLUCONATE 4 % EX LIQD
60.0000 mL | Freq: Once | CUTANEOUS | Status: AC
  Administered 2012-07-03: 4 via TOPICAL
  Filled 2012-07-03: qty 60

## 2012-07-03 MED ORDER — CHLORHEXIDINE GLUCONATE 4 % EX LIQD
60.0000 mL | Freq: Once | CUTANEOUS | Status: AC
  Administered 2012-07-04: 4 via TOPICAL
  Filled 2012-07-03: qty 60

## 2012-07-03 MED ORDER — DEXTROSE 5 % IV SOLN
1.5000 g | INTRAVENOUS | Status: AC
  Administered 2012-07-04: .75 g via INTRAVENOUS
  Administered 2012-07-04: 1.5 g via INTRAVENOUS
  Filled 2012-07-03: qty 1.5

## 2012-07-03 MED ORDER — PLASMA-LYTE 148 IV SOLN
INTRAVENOUS | Status: AC
  Administered 2012-07-04: 10:00:00
  Filled 2012-07-03: qty 2.5

## 2012-07-03 MED ORDER — EPINEPHRINE HCL 1 MG/ML IJ SOLN
0.5000 ug/min | INTRAVENOUS | Status: DC
  Filled 2012-07-03: qty 4

## 2012-07-03 MED ORDER — TEMAZEPAM 15 MG PO CAPS: 15.0000 mg | ORAL_CAPSULE | Freq: Once | ORAL | Status: DC | PRN

## 2012-07-03 MED ORDER — SODIUM CHLORIDE 0.9 % IV SOLN
INTRAVENOUS | Status: AC
  Administered 2012-07-04: 70 mL/h via INTRAVENOUS
  Administered 2012-07-04: 12:00:00 via INTRAVENOUS
  Filled 2012-07-03: qty 40

## 2012-07-03 MED ORDER — METOPROLOL TARTRATE 12.5 MG HALF TABLET
12.5000 mg | ORAL_TABLET | Freq: Once | ORAL | Status: DC
  Filled 2012-07-03: qty 1

## 2012-07-03 MED ORDER — POTASSIUM CHLORIDE 2 MEQ/ML IV SOLN
80.0000 meq | INTRAVENOUS | Status: DC
  Filled 2012-07-03: qty 40

## 2012-07-03 MED ORDER — PHENYLEPHRINE HCL 10 MG/ML IJ SOLN
30.0000 ug/min | INTRAVENOUS | Status: AC
  Administered 2012-07-04: 10 ug/min via INTRAVENOUS
  Filled 2012-07-03: qty 2

## 2012-07-03 MED ORDER — VANCOMYCIN HCL 10 G IV SOLR
1250.0000 mg | INTRAVENOUS | Status: DC
  Filled 2012-07-03: qty 1250

## 2012-07-03 MED ORDER — DEXTROSE 5 % IV SOLN
750.0000 mg | INTRAVENOUS | Status: DC
  Filled 2012-07-03: qty 750

## 2012-07-03 MED ORDER — VANCOMYCIN HCL 10 G IV SOLR
1500.0000 mg | INTRAVENOUS | Status: AC
  Administered 2012-07-04: 1500 mg via INTRAVENOUS
  Filled 2012-07-03: qty 1500

## 2012-07-03 MED ORDER — MAGNESIUM SULFATE 50 % IJ SOLN
40.0000 meq | INTRAMUSCULAR | Status: DC
  Filled 2012-07-03: qty 10

## 2012-07-03 MED ORDER — DOPAMINE-DEXTROSE 3.2-5 MG/ML-% IV SOLN
2.0000 ug/kg/min | INTRAVENOUS | Status: DC
  Filled 2012-07-03: qty 250

## 2012-07-03 MED ORDER — NITROGLYCERIN IN D5W 200-5 MCG/ML-% IV SOLN
2.0000 ug/min | INTRAVENOUS | Status: AC
  Administered 2012-07-04: 5 ug/min via INTRAVENOUS
  Filled 2012-07-03: qty 250

## 2012-07-03 MED ORDER — VANCOMYCIN HCL 1000 MG IV SOLR
INTRAVENOUS | Status: AC
  Administered 2012-07-04: 10:00:00
  Filled 2012-07-03: qty 1000

## 2012-07-03 MED ORDER — DEXMEDETOMIDINE HCL IN NACL 400 MCG/100ML IV SOLN
0.1000 ug/kg/h | INTRAVENOUS | Status: AC
  Administered 2012-07-04: 13:00:00 via INTRAVENOUS
  Administered 2012-07-04: 0.5 ug/kg/h via INTRAVENOUS
  Filled 2012-07-03: qty 100

## 2012-07-03 MED ORDER — SODIUM CHLORIDE 0.9 % IV SOLN
INTRAVENOUS | Status: AC
  Administered 2012-07-04: 3.7 [IU]/h via INTRAVENOUS
  Filled 2012-07-03: qty 1

## 2012-07-03 NOTE — Progress Notes (Signed)
ANTICOAGULATION CONSULT NOTE - Follow Up Consult  Pharmacy Consult for Integrelin Indication: s/p PCI + stents, awaiting CABG  No Known Allergies  Patient Measurements: Height: 5\' 5"  (165.1 cm) Weight: 266 lb 8 oz (120.884 kg) IBW/kg (Calculated) : 61.5 Heparin Dosing Weight:   Vital Signs: Temp: 98.2 F (36.8 C) (06/09 0500) BP: 118/76 mmHg (06/09 0618) Pulse Rate: 63 (06/09 0618)  Labs:  Recent Labs  07/01/12 0535 07/02/12 0545 07/03/12 0445  HGB 14.0 13.8 13.6  HCT 41.2 41.1 40.4  PLT 168 162 175    Estimated Creatinine Clearance: 119.9 ml/min (by C-G formula based on Cr of 0.84).   Assessment: 57 y/o male patient admitted with chest pain requiring anticoagulation for NSTEMI. Has known CAD (x 4 years) but had stopped taking his medications.  Anticoag: s/p heparin now with R groin bleed post-cath with soft hematoma 6/2; Now on integrillin to allow for Brillinta wash out (last dose 6/2 @ 2256) for CABG 6/10. Platelets 175 stable.  ID: Afebrile, WBC 8.5, no abx  Cards: Known CAD with new NSTEMI s/p cath/PCI of RCA with residual proximal LAD/Diag disease. Triglycerides at 247 Meds: ASA 81mg , Lipitor, metoprolol, IV NTG, IV Integrilin  Endo: DM (new). Hgb A1C=10.3. CBGs 120-206 - SSI and Lantus 20/d  GI/Nutrition: Morbid obesity (BMI 44.2)  Pulm: home CPAP for OSA, RA here  Neuro: intact  Neph: sCr 0.84 (stable), CrCl > 100, lytes ok  Heme/onc: CBC stable, plts 162 (162 on admit)  Home Meds: Addressed  Goal of Therapy:  Antiplatelet Monitor platelets by anticoagulation protocol: Yes   Plan:   Plan:  1) Continue integrilin @ 18mcg/kg/min 2) Follow up daily CBC - watch plts   Barry [name] 07/03/2012,9:41 AM

## 2012-07-03 NOTE — Progress Notes (Signed)
      [address]       [name] [address]             [phone]     CARDIOTHORACIC SURGERY PROGRESS NOTE  8 Days Post-Op  S/P Procedure(s) (LRB): LEFT HEART CATHETERIZATION WITH CORONARY ANGIOGRAM (N/A)  Subjective: Feels well.  Had a good weekend.  No chest pain.  Objective: Vital signs in last 24 hours: Temp:  [97.7 F (36.5 C)-98.6 F (37 C)] 98.2 F (36.8 C) (06/09 0500) Pulse Rate:  [55-66] 63 (06/09 0618) Cardiac Rhythm:  [-] Normal sinus rhythm (06/09 0846) Resp:  [18-20] 20 (06/09 0500) BP: (95-137)/(66-76) 118/76 mmHg (06/09 0618) SpO2:  [97 %-99 %] 99 % (06/09 0500) Weight:  [120.884 kg (266 lb 8 oz)] 120.884 kg (266 lb 8 oz) (06/09 0500)  Physical Exam:  Rhythm:   sinus  Breath sounds: clear  Heart sounds:  RRR  Incisions:  n/a  Abdomen:  soft  Extremities:  warm   Intake/Output from previous day: 06/08 0701 - 06/09 0700 In: 960 [P.O.:720; I.V.:240] Out: -  Intake/Output this shift:    Lab Results:  Recent Labs  07/02/12 0545 07/03/12 0445  WBC 7.9 8.5  HGB 13.8 13.6  HCT 41.1 40.4  PLT 162 175   BMET: No results found for this basename: NA, K, CL, CO2, GLUCOSE, BUN, CREATININE, CALCIUM,  in the last 72 hours  CBG (last 3)   Recent Labs  07/02/12 1644 07/02/12 2027 07/03/12 0724  GLUCAP 173* 206* 146*   PT/INR:  No results found for this basename: LABPROT, INR,  in the last 72 hours  CXR:  *RADIOLOGY REPORT*  Clinical Data: Chest pain, shortness of breath  CHEST - 2 VIEW  Comparison: None.  Findings: Mild cardiomegaly. Lungs clear. No effusion. Regional  bones unremarkable.  IMPRESSION:  There are mild cardiomegaly  Original Report Authenticated By: D. Andria Rhein, MD    Assessment/Plan: S/P Procedure(s) (LRB): LEFT HEART CATHETERIZATION WITH CORONARY ANGIOGRAM (N/A)  For OR tomorrow.  The patient understands and accepts all potential associated risks of surgery including but not limited to risk of death,  stroke, myocardial infarction, congestive heart failure, respiratory failure, renal failure, bleeding requiring blood transfusion and/or reexploration, arrhythmia, heart block or bradycardia requiring permanent pacemaker, pneumonia, pleural effusion, wound infection, pulmonary embolus or other thromboembolic complication, chronic pain or other delayed complications.  All questions answered.  Anahli Arvanitis H 07/03/2012 9:14 AM

## 2012-07-03 NOTE — Progress Notes (Addendum)
Subjective: No CP or SOB Objective: Filed Vitals:   07/02/12 2100 07/02/12 2151 07/03/12 0500 07/03/12 0618  BP: 117/68  95/69 118/76  Pulse: 62 66 55 63  Temp: 98.6 F (37 C)  98.2 F (36.8 C)   TempSrc:      Resp: 20 18 20    Height:      Weight:   266 lb 8 oz (120.884 kg)   SpO2: 98% 97% 99%    Weight change:   Intake/Output Summary (Last 24 hours) at 07/03/12 0749 Last data filed at 07/02/12 1300  Gross per 24 hour  Intake    240 ml  Output      0 ml  Net    240 ml    General: Alert, awake, oriented x3, in no acute distress Neck:  JVP is normal Heart: Regular rate and rhythm, without murmurs, rubs, gallops.  Lungs: Clear to auscultation.  No rales or wheezes. Exemities:  No edema.   Neuro: Grossly intact, nonfocal.   Lab Results: Results for orders placed during the hospital encounter of 06/25/12 (from the past 24 hour(s))  GLUCOSE, CAPILLARY     Status: Abnormal   Collection Time    07/02/12 11:41 AM      Result Value Range   Glucose-Capillary 189 (*) 70 - 99 mg/dL   Comment 1 Documented in Chart     Comment 2 Notify RN    GLUCOSE, CAPILLARY     Status: Abnormal   Collection Time    07/02/12  4:44 PM      Result Value Range   Glucose-Capillary 173 (*) 70 - 99 mg/dL  GLUCOSE, CAPILLARY     Status: Abnormal   Collection Time    07/02/12  8:27 PM      Result Value Range   Glucose-Capillary 206 (*) 70 - 99 mg/dL  CBC     Status: None   Collection Time    07/03/12  4:45 AM      Result Value Range   WBC 8.5  4.0 - 10.5 K/uL   RBC 4.61  4.22 - 5.81 MIL/uL   Hemoglobin 13.6  13.0 - 17.0 g/dL   HCT 16.1  09.6 - 04.5 %   MCV 87.6  78.0 - 100.0 fL   MCH 29.5  26.0 - 34.0 pg   MCHC 33.7  30.0 - 36.0 g/dL   RDW 40.9  81.1 - 91.4 %   Platelets 175  150 - 400 K/uL    Studies/Results: @RISRSLT24 @  Medications: Reviewed   @PROBHOSP @  1.  CAD Pt admitted 06/25/12. S/p cath/pci of RCA in setting of inferior MI 06/25/12 per Dr. Allyson Sabal. 2 DES and 1 bare metal  stent placed in RCA from ostium down to distal vessel secondary to vessel dissection during PCI. Residual high grade stenosis involving the mid LAD at site of previous stent involving large diagonal branch. The mid LAD/Diagonal lesion is not favorable for PCI. Dr. Cornelius Moras with CT surgery has seen the patient and is making plans for bypass of the LAD/Diagonal and potentially RCA next week. Brilinta was loaded before the cath and continue through Monday night 06/26/12 then stopped on 06/27/12.  He is currently on an Integrilin drip while allowing time for Brilinta to washout while awaiting CABG next Tuesday. Cont ASA, beta blocker, statin.  He remains asymptomatic  2.  HL Patient is on lipitor.  Continue.  Dietrich Pates 07/03/2012, 7:49 AM

## 2012-07-04 ENCOUNTER — Encounter (HOSPITAL_COMMUNITY): Payer: Self-pay | Admitting: Anesthesiology

## 2012-07-04 ENCOUNTER — Inpatient Hospital Stay (HOSPITAL_COMMUNITY): Payer: 59 | Admitting: Anesthesiology

## 2012-07-04 ENCOUNTER — Inpatient Hospital Stay (HOSPITAL_COMMUNITY): Payer: 59

## 2012-07-04 ENCOUNTER — Encounter (HOSPITAL_COMMUNITY): Payer: Self-pay | Admitting: Certified Registered Nurse Anesthetist

## 2012-07-04 ENCOUNTER — Encounter (HOSPITAL_COMMUNITY)
Admission: EM | Disposition: A | Discharge status: Skilled Nursing Facility | Payer: Self-pay | Source: Home / Self Care | Attending: Cardiology

## 2012-07-04 DIAGNOSIS — I2510 Atherosclerotic heart disease of native coronary artery without angina pectoris: Secondary | ICD-10-CM

## 2012-07-04 DIAGNOSIS — Z951 Presence of aortocoronary bypass graft: Secondary | ICD-10-CM

## 2012-07-04 HISTORY — PX: CORONARY ARTERY BYPASS GRAFT: SHX141

## 2012-07-04 HISTORY — PX: INTRAOPERATIVE TRANSESOPHAGEAL ECHOCARDIOGRAM: SHX5062

## 2012-07-04 HISTORY — DX: Presence of aortocoronary bypass graft: Z95.1

## 2012-07-04 LAB — PROTIME-INR: Prothrombin Time: 16.2 seconds — ABNORMAL HIGH (ref 11.6–15.2)

## 2012-07-04 LAB — BASIC METABOLIC PANEL
BUN: 13 mg/dL (ref 6–23)
CO2: 27 mEq/L (ref 19–32)
Calcium: 9.1 mg/dL (ref 8.4–10.5)
Creatinine, Ser: 0.94 mg/dL (ref 0.50–1.35)
GFR calc non Af Amer: >90 mL/min (ref >90)
Glucose, Bld: 258 mg/dL — ABNORMAL HIGH (ref 70–99)

## 2012-07-04 LAB — POCT I-STAT 3, ART BLOOD GAS (G3+)
Acid-base deficit: 1 mmol/L (ref 0.0–2.0)
Acid-base deficit: 4 mmol/L — ABNORMAL HIGH (ref 0.0–2.0)
Acid-base deficit: 2 mmol/L (ref 0.0–2.0)
Bicarbonate: 22.6 mEq/L (ref 20.0–24.0)
Bicarbonate: 24.1 mEq/L — ABNORMAL HIGH (ref 20.0–24.0)
Bicarbonate: 21.9 mEq/L (ref 20.0–24.0)
Bicarbonate: 24 mEq/L (ref 20.0–24.0)
O2 Saturation: 100 %
O2 Saturation: 99 %
O2 Saturation: 97 %
Patient temperature: 35.5
Patient temperature: 36.5
TCO2: 25 mmol/L (ref 0–100)
TCO2: 25 mmol/L (ref 0–100)
TCO2: 23 mmol/L (ref 0–100)
TCO2: 25 mmol/L (ref 0–100)
pCO2 arterial: 39.8 mmHg (ref 35.0–45.0)
pH, Arterial: 7.397 (ref 7.350–7.450)
pH, Arterial: 7.39 (ref 7.350–7.450)
pH, Arterial: 7.361 (ref 7.350–7.450)
pO2, Arterial: 307 mmHg — ABNORMAL HIGH (ref 80.0–100.0)
pO2, Arterial: 122 mmHg — ABNORMAL HIGH (ref 80.0–100.0)
pO2, Arterial: 89 mmHg (ref 80.0–100.0)

## 2012-07-04 LAB — POCT I-STAT 4, (NA,K, GLUC, HGB,HCT)
Glucose, Bld: 183 mg/dL — ABNORMAL HIGH (ref 70–99)
Glucose, Bld: 175 mg/dL — ABNORMAL HIGH (ref 70–99)
Glucose, Bld: 160 mg/dL — ABNORMAL HIGH (ref 70–99)
Glucose, Bld: 169 mg/dL — ABNORMAL HIGH (ref 70–99)
Glucose, Bld: 125 mg/dL — ABNORMAL HIGH (ref 70–99)
HCT: 42 % (ref 39.0–52.0)
HCT: 34 % — ABNORMAL LOW (ref 39.0–52.0)
HCT: 34 % — ABNORMAL LOW (ref 39.0–52.0)
HCT: 35 % — ABNORMAL LOW (ref 39.0–52.0)
Hemoglobin: 14.3 g/dL (ref 13.0–17.0)
Hemoglobin: 10.5 g/dL — ABNORMAL LOW (ref 13.0–17.0)
Hemoglobin: 11.6 g/dL — ABNORMAL LOW (ref 13.0–17.0)
Hemoglobin: 11.9 g/dL — ABNORMAL LOW (ref 13.0–17.0)
Potassium: 4.8 mEq/L (ref 3.5–5.1)
Potassium: 4.2 mEq/L (ref 3.5–5.1)
Potassium: 4.2 mEq/L (ref 3.5–5.1)
Potassium: 3.7 mEq/L (ref 3.5–5.1)
Sodium: 140 mEq/L (ref 135–145)
Sodium: 142 mEq/L (ref 135–145)
Sodium: 140 mEq/L (ref 135–145)

## 2012-07-04 LAB — POCT I-STAT, CHEM 8
BUN: 9 mg/dL (ref 6–23)
Chloride: 108 mEq/L (ref 96–112)
HCT: 36 % — ABNORMAL LOW (ref 39.0–52.0)
Sodium: 141 mEq/L (ref 135–145)
TCO2: 25 mmol/L (ref 0–100)

## 2012-07-04 LAB — CBC
HCT: 40.1 % (ref 39.0–52.0)
HCT: 36.4 % — ABNORMAL LOW (ref 39.0–52.0)
Hemoglobin: 13.3 g/dL (ref 13.0–17.0)
Hemoglobin: 11.5 g/dL — ABNORMAL LOW (ref 13.0–17.0)
Hemoglobin: 12.3 g/dL — ABNORMAL LOW (ref 13.0–17.0)
MCH: 29.7 pg (ref 26.0–34.0)
MCH: 29.6 pg (ref 26.0–34.0)
MCH: 29.8 pg (ref 26.0–34.0)
MCHC: 33.2 g/dL (ref 30.0–36.0)
MCHC: 33.8 g/dL (ref 30.0–36.0)
MCV: 89.5 fL (ref 78.0–100.0)
RBC: 3.89 MIL/uL — ABNORMAL LOW (ref 4.22–5.81)

## 2012-07-04 LAB — GLUCOSE, CAPILLARY
Glucose-Capillary: 77 mg/dL (ref 70–99)
Glucose-Capillary: 98 mg/dL (ref 70–99)
Glucose-Capillary: 102 mg/dL — ABNORMAL HIGH (ref 70–99)
Glucose-Capillary: 124 mg/dL — ABNORMAL HIGH (ref 70–99)
Glucose-Capillary: 182 mg/dL — ABNORMAL HIGH (ref 70–99)
Glucose-Capillary: 165 mg/dL — ABNORMAL HIGH (ref 70–99)

## 2012-07-04 LAB — PLATELET COUNT: Platelets: 145 10*3/uL — ABNORMAL LOW (ref 150–400)

## 2012-07-04 SURGERY — CORONARY ARTERY BYPASS GRAFTING (CABG)
Anesthesia: General | Site: Chest | Wound class: Clean

## 2012-07-04 MED ORDER — SODIUM CHLORIDE 0.9 % IJ SOLN: 3.0000 mL | Freq: Two times a day (BID) | INTRAMUSCULAR | Status: DC

## 2012-07-04 MED ORDER — METOPROLOL TARTRATE 1 MG/ML IV SOLN: 2.5000 mg | INTRAVENOUS | Status: DC | PRN

## 2012-07-04 MED ORDER — FENTANYL CITRATE 0.05 MG/ML IJ SOLN
INTRAMUSCULAR | Status: DC | PRN
  Administered 2012-07-04: 150 ug via INTRAVENOUS
  Administered 2012-07-04: 250 ug via INTRAVENOUS
  Administered 2012-07-04: 150 ug via INTRAVENOUS
  Administered 2012-07-04: 100 ug via INTRAVENOUS
  Administered 2012-07-04: 150 ug via INTRAVENOUS
  Administered 2012-07-04: 350 ug via INTRAVENOUS
  Administered 2012-07-04: 250 ug via INTRAVENOUS
  Administered 2012-07-04: 100 ug via INTRAVENOUS
  Administered 2012-07-04 (×3): 50 ug via INTRAVENOUS
  Administered 2012-07-04: 150 ug via INTRAVENOUS
  Administered 2012-07-04 (×2): 100 ug via INTRAVENOUS

## 2012-07-04 MED ORDER — INSULIN REGULAR HUMAN 100 UNIT/ML IJ SOLN
INTRAMUSCULAR | Status: DC
  Administered 2012-07-04: 4.6 [IU]/h via INTRAVENOUS
  Filled 2012-07-04 (×2): qty 1

## 2012-07-04 MED ORDER — ACETAMINOPHEN 160 MG/5ML PO SOLN: 975.0000 mg | Freq: Four times a day (QID) | ORAL | Status: DC

## 2012-07-04 MED ORDER — DEXMEDETOMIDINE HCL IN NACL 200 MCG/50ML IV SOLN
0.1000 ug/kg/h | INTRAVENOUS | Status: DC
  Filled 2012-07-04: qty 50

## 2012-07-04 MED ORDER — SODIUM CHLORIDE 0.9 % IV SOLN: INTRAVENOUS | Status: DC

## 2012-07-04 MED ORDER — SODIUM CHLORIDE 0.9 % IJ SOLN
OROMUCOSAL | Status: DC | PRN
  Administered 2012-07-04: 10:00:00 via TOPICAL

## 2012-07-04 MED ORDER — ARTIFICIAL TEARS OP OINT
TOPICAL_OINTMENT | OPHTHALMIC | Status: DC | PRN
  Administered 2012-07-04: 1 via OPHTHALMIC

## 2012-07-04 MED ORDER — PANTOPRAZOLE SODIUM 40 MG PO TBEC
40.0000 mg | DELAYED_RELEASE_TABLET | Freq: Every day | ORAL | Status: DC
Start: 2012-07-06 — End: 2012-07-10
  Administered 2012-07-06 – 2012-07-10 (×5): 40 mg via ORAL
  Filled 2012-07-04 (×5): qty 1

## 2012-07-04 MED ORDER — BISACODYL 5 MG PO TBEC
10.0000 mg | DELAYED_RELEASE_TABLET | Freq: Every day | ORAL | Status: DC
  Administered 2012-07-05 – 2012-07-09 (×4): 10 mg via ORAL
  Filled 2012-07-04 (×5): qty 2
  Filled 2012-07-04: qty 1
  Filled 2012-07-04: qty 2

## 2012-07-04 MED ORDER — POTASSIUM CHLORIDE 10 MEQ/50ML IV SOLN
10.0000 meq | INTRAVENOUS | Status: AC
  Administered 2012-07-04 (×3): 10 meq via INTRAVENOUS

## 2012-07-04 MED ORDER — SUCCINYLCHOLINE CHLORIDE 20 MG/ML IJ SOLN
INTRAMUSCULAR | Status: DC | PRN
  Administered 2012-07-04: 120 mg via INTRAVENOUS

## 2012-07-04 MED ORDER — GLYCOPYRROLATE 0.2 MG/ML IJ SOLN
INTRAMUSCULAR | Status: DC | PRN
  Administered 2012-07-04 (×2): 0.2 mg via INTRAVENOUS

## 2012-07-04 MED ORDER — SODIUM CHLORIDE 0.9 % IV SOLN: 250.0000 mL | INTRAVENOUS | Status: DC

## 2012-07-04 MED ORDER — DOCUSATE SODIUM 100 MG PO CAPS
200.0000 mg | ORAL_CAPSULE | Freq: Every day | ORAL | Status: DC
  Administered 2012-07-05 – 2012-07-10 (×6): 200 mg via ORAL
  Filled 2012-07-04 (×6): qty 2

## 2012-07-04 MED ORDER — LACTATED RINGERS IV SOLN
INTRAVENOUS | Status: DC | PRN
  Administered 2012-07-04: 07:00:00 via INTRAVENOUS

## 2012-07-04 MED ORDER — FAMOTIDINE IN NACL 20-0.9 MG/50ML-% IV SOLN
20.0000 mg | Freq: Two times a day (BID) | INTRAVENOUS | Status: AC
  Administered 2012-07-04: 20 mg via INTRAVENOUS

## 2012-07-04 MED ORDER — ACETAMINOPHEN 10 MG/ML IV SOLN
1000.0000 mg | Freq: Once | INTRAVENOUS | Status: AC
  Administered 2012-07-04: 1000 mg via INTRAVENOUS
  Filled 2012-07-04: qty 100

## 2012-07-04 MED ORDER — VANCOMYCIN HCL IN DEXTROSE 1-5 GM/200ML-% IV SOLN
1000.0000 mg | Freq: Once | INTRAVENOUS | Status: AC
  Administered 2012-07-04: 1000 mg via INTRAVENOUS
  Filled 2012-07-04: qty 200

## 2012-07-04 MED ORDER — ONDANSETRON HCL 4 MG/2ML IJ SOLN: 4.0000 mg | Freq: Four times a day (QID) | INTRAMUSCULAR | Status: DC | PRN

## 2012-07-04 MED ORDER — METOPROLOL TARTRATE 25 MG/10 ML ORAL SUSPENSION
12.5000 mg | Freq: Two times a day (BID) | ORAL | Status: DC
  Filled 2012-07-04 (×3): qty 5

## 2012-07-04 MED ORDER — ACETAMINOPHEN 500 MG PO TABS
1000.0000 mg | ORAL_TABLET | Freq: Four times a day (QID) | ORAL | Status: AC
  Administered 2012-07-05 – 2012-07-09 (×12): 1000 mg via ORAL
  Filled 2012-07-04 (×18): qty 2

## 2012-07-04 MED ORDER — MIDAZOLAM HCL 5 MG/5ML IJ SOLN
INTRAMUSCULAR | Status: DC | PRN
  Administered 2012-07-04: 1 mg via INTRAVENOUS
  Administered 2012-07-04: 2 mg via INTRAVENOUS
  Administered 2012-07-04: 1 mg via INTRAVENOUS
  Administered 2012-07-04: 4 mg via INTRAVENOUS
  Administered 2012-07-04 (×2): 1 mg via INTRAVENOUS

## 2012-07-04 MED ORDER — LIDOCAINE HCL (CARDIAC) 20 MG/ML IV SOLN
INTRAVENOUS | Status: DC | PRN
  Administered 2012-07-04: 65 mg via INTRAVENOUS

## 2012-07-04 MED ORDER — SODIUM CHLORIDE 0.45 % IV SOLN
INTRAVENOUS | Status: DC
  Administered 2012-07-04: 20 mL/h via INTRAVENOUS

## 2012-07-04 MED ORDER — VECURONIUM BROMIDE 10 MG IV SOLR
INTRAVENOUS | Status: DC | PRN
  Administered 2012-07-04 (×2): 10 mg via INTRAVENOUS

## 2012-07-04 MED ORDER — CALCIUM CHLORIDE 10 % IV SOLN
1.0000 g | Freq: Once | INTRAVENOUS | Status: AC | PRN
  Filled 2012-07-04: qty 10

## 2012-07-04 MED ORDER — LACTATED RINGERS IV SOLN: INTRAVENOUS | Status: DC

## 2012-07-04 MED ORDER — 0.9 % SODIUM CHLORIDE (POUR BTL) OPTIME
TOPICAL | Status: DC | PRN
  Administered 2012-07-04: 1000 mL

## 2012-07-04 MED ORDER — PROPOFOL 10 MG/ML IV BOLUS
INTRAVENOUS | Status: DC | PRN
  Administered 2012-07-04: 120 mg via INTRAVENOUS

## 2012-07-04 MED ORDER — ALBUMIN HUMAN 5 % IV SOLN
INTRAVENOUS | Status: DC | PRN
  Administered 2012-07-04 (×2): via INTRAVENOUS

## 2012-07-04 MED ORDER — SODIUM CHLORIDE 0.9 % IJ SOLN: 3.0000 mL | INTRAMUSCULAR | Status: DC | PRN

## 2012-07-04 MED ORDER — DEXTROSE 5 % IV SOLN
1.5000 g | Freq: Two times a day (BID) | INTRAVENOUS | Status: AC
  Administered 2012-07-04 – 2012-07-06 (×4): 1.5 g via INTRAVENOUS
  Filled 2012-07-04 (×5): qty 1.5

## 2012-07-04 MED ORDER — SODIUM CHLORIDE 0.9 % IV SOLN
1.0000 g/h | Freq: Once | INTRAVENOUS | Status: DC
  Filled 2012-07-04: qty 40

## 2012-07-04 MED ORDER — ASPIRIN EC 325 MG PO TBEC
325.0000 mg | DELAYED_RELEASE_TABLET | Freq: Every day | ORAL | Status: DC
  Filled 2012-07-04: qty 1

## 2012-07-04 MED ORDER — NITROGLYCERIN IN D5W 200-5 MCG/ML-% IV SOLN: 0.0000 ug/min | INTRAVENOUS | Status: DC

## 2012-07-04 MED ORDER — MIDAZOLAM HCL 2 MG/2ML IJ SOLN: 2.0000 mg | INTRAMUSCULAR | Status: DC | PRN

## 2012-07-04 MED ORDER — PHENYLEPHRINE HCL 10 MG/ML IJ SOLN
0.0000 ug/min | INTRAVENOUS | Status: DC
  Filled 2012-07-04: qty 2

## 2012-07-04 MED ORDER — MORPHINE SULFATE 2 MG/ML IJ SOLN
2.0000 mg | INTRAMUSCULAR | Status: DC | PRN
  Administered 2012-07-04: 2 mg via INTRAVENOUS
  Administered 2012-07-04 (×2): 1 mg via INTRAVENOUS
  Administered 2012-07-05: 2 mg via INTRAVENOUS
  Administered 2012-07-05: 4 mg via INTRAVENOUS
  Filled 2012-07-04: qty 2
  Filled 2012-07-04: qty 1
  Filled 2012-07-04: qty 2
  Filled 2012-07-04: qty 1
  Filled 2012-07-04: qty 2

## 2012-07-04 MED ORDER — METOPROLOL TARTRATE 12.5 MG HALF TABLET
12.5000 mg | ORAL_TABLET | Freq: Two times a day (BID) | ORAL | Status: DC
  Administered 2012-07-05 – 2012-07-10 (×10): 12.5 mg via ORAL
  Filled 2012-07-04 (×13): qty 1

## 2012-07-04 MED ORDER — INSULIN REGULAR BOLUS VIA INFUSION
0.0000 [IU] | Freq: Three times a day (TID) | INTRAVENOUS | Status: DC
  Filled 2012-07-04: qty 10

## 2012-07-04 MED ORDER — PROTAMINE SULFATE 10 MG/ML IV SOLN
INTRAVENOUS | Status: DC | PRN
  Administered 2012-07-04: 250 mg via INTRAVENOUS

## 2012-07-04 MED ORDER — ALBUMIN HUMAN 5 % IV SOLN
250.0000 mL | INTRAVENOUS | Status: DC | PRN
  Administered 2012-07-04: 250 mL via INTRAVENOUS
  Filled 2012-07-04: qty 250

## 2012-07-04 MED ORDER — ROCURONIUM BROMIDE 100 MG/10ML IV SOLN
INTRAVENOUS | Status: DC | PRN
  Administered 2012-07-04 (×2): 50 mg via INTRAVENOUS

## 2012-07-04 MED ORDER — MORPHINE SULFATE 2 MG/ML IJ SOLN: 1.0000 mg | INTRAMUSCULAR | Status: AC | PRN

## 2012-07-04 MED ORDER — LACTATED RINGERS IV SOLN
INTRAVENOUS | Status: DC | PRN
  Administered 2012-07-04: 09:00:00 via INTRAVENOUS

## 2012-07-04 MED ORDER — BISACODYL 10 MG RE SUPP
10.0000 mg | Freq: Every day | RECTAL | Status: DC
  Filled 2012-07-04: qty 1

## 2012-07-04 MED ORDER — DEXMEDETOMIDINE HCL IN NACL 400 MCG/100ML IV SOLN
0.4000 ug/kg/h | INTRAVENOUS | Status: DC
  Filled 2012-07-04: qty 100

## 2012-07-04 MED ORDER — MAGNESIUM SULFATE 40 MG/ML IJ SOLN
4.0000 g | Freq: Once | INTRAMUSCULAR | Status: AC
  Administered 2012-07-04: 4 g via INTRAVENOUS
  Filled 2012-07-04: qty 100

## 2012-07-04 MED ORDER — ASPIRIN 81 MG PO CHEW
324.0000 mg | CHEWABLE_TABLET | Freq: Every day | ORAL | Status: DC
Start: 2012-07-05 — End: 2012-07-05

## 2012-07-04 MED ORDER — OXYCODONE HCL 5 MG PO TABS
5.0000 mg | ORAL_TABLET | ORAL | Status: DC | PRN
  Administered 2012-07-04: 5 mg via ORAL
  Administered 2012-07-05 – 2012-07-08 (×9): 10 mg via ORAL
  Administered 2012-07-08: 5 mg via ORAL
  Administered 2012-07-08 – 2012-07-10 (×4): 10 mg via ORAL
  Filled 2012-07-04 (×4): qty 2
  Filled 2012-07-04: qty 1
  Filled 2012-07-04 (×6): qty 2
  Filled 2012-07-04: qty 1
  Filled 2012-07-04 (×3): qty 2

## 2012-07-04 MED ORDER — HEPARIN SODIUM (PORCINE) 1000 UNIT/ML IJ SOLN
INTRAMUSCULAR | Status: DC | PRN
  Administered 2012-07-04: 3000 [IU] via INTRAVENOUS
  Administered 2012-07-04: 33000 [IU] via INTRAVENOUS

## 2012-07-04 SURGICAL SUPPLY — 130 items
ADAPTER CARDIO PERF ANTE/RETRO (ADAPTER) IMPLANT
ADH SKN CLS APL DERMABOND .7 (GAUZE/BANDAGES/DRESSINGS) ×2
ADPR PRFSN 84XANTGRD RTRGD (ADAPTER)
APL SKNCLS STERI-STRIP NONHPOA (GAUZE/BANDAGES/DRESSINGS)
APPLIER CLIP 9.375 MED OPEN (MISCELLANEOUS)
APPLIER CLIP 9.375 SM OPEN (CLIP)
APR CLP MED 9.3 20 MLT OPN (MISCELLANEOUS)
APR CLP SM 9.3 20 MLT OPN (CLIP)
ATTRACTOMAT 16X20 MAGNETIC DRP (DRAPES) ×3 IMPLANT
BAG DECANTER FOR FLEXI CONT (MISCELLANEOUS) ×3 IMPLANT
BANDAGE ELASTIC 4 VELCRO ST LF (GAUZE/BANDAGES/DRESSINGS) ×3 IMPLANT
BANDAGE ELASTIC 6 VELCRO ST LF (GAUZE/BANDAGES/DRESSINGS) ×3 IMPLANT
BANDAGE GAUZE ELAST BULKY 4 IN (GAUZE/BANDAGES/DRESSINGS) ×3 IMPLANT
BASKET HEART (ORDER IN 25'S) (MISCELLANEOUS)
BASKET HEART (ORDER IN 25S) (MISCELLANEOUS) ×2 IMPLANT
BENZOIN TINCTURE PRP APPL 2/3 (GAUZE/BANDAGES/DRESSINGS) ×2 IMPLANT
BLADE STERNUM SYSTEM 6 (BLADE) ×3 IMPLANT
BLADE SURG ROTATE 9660 (MISCELLANEOUS) ×1 IMPLANT
CANISTER SUCTION 2500CC (MISCELLANEOUS) ×3 IMPLANT
CANNULA EZ GLIDE AORTIC 21FR (CANNULA) ×3 IMPLANT
CANNULA GUNDRY RCSP 15FR (MISCELLANEOUS) IMPLANT
CANNULA VENOUS LOW PROF 34X46 (CANNULA) ×3 IMPLANT
CATH CPB KIT OWEN (MISCELLANEOUS) ×3 IMPLANT
CATH THORACIC 28FR (CATHETERS) IMPLANT
CATH THORACIC 28FR RT ANG (CATHETERS) IMPLANT
CATH THORACIC 36FR (CATHETERS) ×3 IMPLANT
CATH THORACIC 36FR RT ANG (CATHETERS) ×3 IMPLANT
CLIP APPLIE 9.375 MED OPEN (MISCELLANEOUS) IMPLANT
CLIP APPLIE 9.375 SM OPEN (CLIP) IMPLANT
CLIP FOGARTY SPRING 6M (CLIP) IMPLANT
CLIP RETRACTION 3.0MM CORONARY (MISCELLANEOUS) ×1 IMPLANT
CLIP TI MEDIUM 24 (CLIP) IMPLANT
CLIP TI WIDE RED SMALL 24 (CLIP) IMPLANT
CLOTH BEACON ORANGE TIMEOUT ST (SAFETY) ×3 IMPLANT
CONN ST 1/4X3/8  BEN (MISCELLANEOUS) ×1
CONN ST 1/4X3/8 BEN (MISCELLANEOUS) IMPLANT
CONN Y 3/8X3/8X3/8  BEN (MISCELLANEOUS)
CONN Y 3/8X3/8X3/8 BEN (MISCELLANEOUS) IMPLANT
COVER SURGICAL LIGHT HANDLE (MISCELLANEOUS) ×3 IMPLANT
CRADLE DONUT ADULT HEAD (MISCELLANEOUS) ×3 IMPLANT
DERMABOND ADVANCED (GAUZE/BANDAGES/DRESSINGS) ×1
DERMABOND ADVANCED .7 DNX12 (GAUZE/BANDAGES/DRESSINGS) IMPLANT
DRAIN CHANNEL 32F RND 10.7 FF (WOUND CARE) ×4 IMPLANT
DRAPE CARDIOVASCULAR INCISE (DRAPES) ×3
DRAPE INCISE IOBAN 66X45 STRL (DRAPES) ×1 IMPLANT
DRAPE SLUSH/WARMER DISC (DRAPES) ×3 IMPLANT
DRAPE SRG 135X102X78XABS (DRAPES) ×2 IMPLANT
DRSG COVADERM 4X14 (GAUZE/BANDAGES/DRESSINGS) ×3 IMPLANT
ELECT BLADE 4.0 EZ CLEAN MEGAD (MISCELLANEOUS) ×3
ELECT REM PT RETURN 9FT ADLT (ELECTROSURGICAL) ×6
ELECTRODE BLDE 4.0 EZ CLN MEGD (MISCELLANEOUS) IMPLANT
ELECTRODE REM PT RTRN 9FT ADLT (ELECTROSURGICAL) ×4 IMPLANT
FELT TEFLON 6X6 (MISCELLANEOUS) ×2 IMPLANT
GLOVE BIO SURGEON STRL SZ 6 (GLOVE) ×2 IMPLANT
GLOVE BIO SURGEON STRL SZ 6.5 (GLOVE) ×4 IMPLANT
GLOVE BIO SURGEON STRL SZ7 (GLOVE) ×4 IMPLANT
GLOVE BIO SURGEON STRL SZ7.5 (GLOVE) ×2 IMPLANT
GLOVE BIOGEL PI IND STRL 6 (GLOVE) IMPLANT
GLOVE BIOGEL PI IND STRL 6.5 (GLOVE) IMPLANT
GLOVE BIOGEL PI IND STRL 7.0 (GLOVE) IMPLANT
GLOVE BIOGEL PI INDICATOR 6 (GLOVE) ×2
GLOVE BIOGEL PI INDICATOR 6.5 (GLOVE) ×5
GLOVE BIOGEL PI INDICATOR 7.0 (GLOVE) ×1
GLOVE EUDERMIC 7 POWDERFREE (GLOVE) IMPLANT
GLOVE ORTHO TXT STRL SZ7.5 (GLOVE) ×6 IMPLANT
GOWN STRL NON-REIN LRG LVL3 (GOWN DISPOSABLE) ×16 IMPLANT
HEMOSTAT POWDER SURGIFOAM 1G (HEMOSTASIS) ×9 IMPLANT
INSERT FOGARTY 61MM (MISCELLANEOUS) IMPLANT
INSERT FOGARTY XLG (MISCELLANEOUS) ×3 IMPLANT
KIT BASIN OR (CUSTOM PROCEDURE TRAY) ×3 IMPLANT
KIT ROOM TURNOVER OR (KITS) ×3 IMPLANT
KIT SUCTION CATH 14FR (SUCTIONS) ×13 IMPLANT
KIT VASOVIEW W/TROCAR VH 2000 (KITS) ×3 IMPLANT
LEAD PACING MYOCARDI (MISCELLANEOUS) ×3 IMPLANT
MARKER GRAFT CORONARY BYPASS (MISCELLANEOUS) ×9 IMPLANT
NS IRRIG 1000ML POUR BTL (IV SOLUTION) ×15 IMPLANT
PACK OPEN HEART (CUSTOM PROCEDURE TRAY) ×3 IMPLANT
PAD ARMBOARD 7.5X6 YLW CONV (MISCELLANEOUS) ×3 IMPLANT
PAD ELECT DEFIB RADIOL ZOLL (MISCELLANEOUS) ×3 IMPLANT
PAD SHARPS MAGNETIC DISPOSAL (MISCELLANEOUS) ×1 IMPLANT
PENCIL BUTTON HOLSTER BLD 10FT (ELECTRODE) ×3 IMPLANT
PUNCH AORTIC ROTATE 4.0MM (MISCELLANEOUS) IMPLANT
PUNCH AORTIC ROTATE 4.5MM 8IN (MISCELLANEOUS) ×1 IMPLANT
PUNCH AORTIC ROTATE 5MM 8IN (MISCELLANEOUS) IMPLANT
SOLUTION ANTI FOG 6CC (MISCELLANEOUS) IMPLANT
SPONGE GAUZE 4X4 12PLY (GAUZE/BANDAGES/DRESSINGS) ×6 IMPLANT
SPONGE LAP 18X18 X RAY DECT (DISPOSABLE) ×2 IMPLANT
SPONGE LAP 4X18 X RAY DECT (DISPOSABLE) IMPLANT
SUT BONE WAX W31G (SUTURE) ×3 IMPLANT
SUT ETHIBOND X763 2 0 SH 1 (SUTURE) ×6 IMPLANT
SUT MNCRL AB 3-0 PS2 18 (SUTURE) ×6 IMPLANT
SUT MNCRL AB 4-0 PS2 18 (SUTURE) IMPLANT
SUT PDS AB 1 CTX 36 (SUTURE) ×6 IMPLANT
SUT PROLENE 2 0 SH DA (SUTURE) IMPLANT
SUT PROLENE 3 0 SH DA (SUTURE) ×3 IMPLANT
SUT PROLENE 3 0 SH1 36 (SUTURE) IMPLANT
SUT PROLENE 4 0 RB 1 (SUTURE) ×6
SUT PROLENE 4 0 SH DA (SUTURE) IMPLANT
SUT PROLENE 4-0 RB1 .5 CRCL 36 (SUTURE) IMPLANT
SUT PROLENE 5 0 C 1 36 (SUTURE) IMPLANT
SUT PROLENE 6 0 C 1 30 (SUTURE) ×2 IMPLANT
SUT PROLENE 7.0 RB 3 (SUTURE) ×12 IMPLANT
SUT PROLENE 8 0 BV175 6 (SUTURE) ×1 IMPLANT
SUT PROLENE BLUE 7 0 (SUTURE) ×4 IMPLANT
SUT PROLENE POLY MONO (SUTURE) IMPLANT
SUT SILK  1 MH (SUTURE) ×1
SUT SILK 1 MH (SUTURE) ×2 IMPLANT
SUT STEEL 6MS V (SUTURE) IMPLANT
SUT STEEL STERNAL CCS#1 18IN (SUTURE) IMPLANT
SUT STEEL SZ 6 DBL 3X14 BALL (SUTURE) IMPLANT
SUT VIC AB 1 CTX 36 (SUTURE)
SUT VIC AB 1 CTX36XBRD ANBCTR (SUTURE) IMPLANT
SUT VIC AB 2-0 CT1 27 (SUTURE) ×3
SUT VIC AB 2-0 CT1 TAPERPNT 27 (SUTURE) IMPLANT
SUT VIC AB 2-0 CTX 27 (SUTURE) IMPLANT
SUT VIC AB 3-0 SH 27 (SUTURE)
SUT VIC AB 3-0 SH 27X BRD (SUTURE) IMPLANT
SUT VIC AB 3-0 X1 27 (SUTURE) ×1 IMPLANT
SUT VICRYL 4-0 PS2 18IN ABS (SUTURE) IMPLANT
SUTURE E-PAK OPEN HEART (SUTURE) ×3 IMPLANT
SYSTEM SAHARA CHEST DRAIN ATS (WOUND CARE) ×3 IMPLANT
TAPE CLOTH SURG 4X10 WHT LF (GAUZE/BANDAGES/DRESSINGS) ×1 IMPLANT
TAPE PAPER 2X10 WHT MICROPORE (GAUZE/BANDAGES/DRESSINGS) ×1 IMPLANT
TOWEL OR 17X24 6PK STRL BLUE (TOWEL DISPOSABLE) ×6 IMPLANT
TOWEL OR 17X26 10 PK STRL BLUE (TOWEL DISPOSABLE) ×7 IMPLANT
TRAY FOLEY IC TEMP SENS 14FR (CATHETERS) ×3 IMPLANT
TUBE SUCT INTRACARD DLP 20F (MISCELLANEOUS) ×3 IMPLANT
TUBING INSUFFLATION 10FT LAP (TUBING) ×3 IMPLANT
UNDERPAD 30X30 INCONTINENT (UNDERPADS AND DIAPERS) ×3 IMPLANT
WATER STERILE IRR 1000ML POUR (IV SOLUTION) ×6 IMPLANT

## 2012-07-04 NOTE — Preoperative (Signed)
Beta Blockers   Reason not to administer Beta Blockers:Not Applicable  AM Lopressor held for HR < 60.

## 2012-07-04 NOTE — Op Note (Signed)
CARDIOTHORACIC SURGERY OPERATIVE NOTE  Date of Procedure: 07/04/2012  Preoperative Diagnosis: Severe 2-vessel Coronary Artery Disease  Postoperative Diagnosis: Same  Procedure:   Coronary Artery Bypass Grafting x 3  Left Internal Mammary Artery to Distal Left Anterior Descending Coronary Artery Saphenous Vein Graft to Posterior Descending Coronary Artery Sapheonous Vein Graft to Diagonal Branch Coronary Artery Endoscopic Vein Harvest from Right Thigh  Surgeon: Salvatore Decent. Cornelius Moras, MD  Assistant: Rowe Clack, PA-C  Anesthesia: Judie Petit, MD   Operative Findings:  Normal LV systolic function  Good quality LIMA and SVG conduit for grafting  Good quality LAD and D1 target vessels for grafting  Poor quality PDA target vessel for grafting    BRIEF CLINICAL NOTE AND INDICATIONS FOR SURGERY  Patient is a 57 year old morbidly obese white male from [facility] with known history of coronary artery disease and recently discovered type 2 diabetes mellitus and hyperlipidemia was admitted to the hospital yesterday with an acute non-ST segment elevation myocardial infarction. The patient's cardiac history dates back to 2009 when he sustained an acute inferior wall myocardial infarction. He was treated at [facility] where he underwent PCI and stenting of the mid RCA by Dr. Bary Castilla. He was only seen in followup recently and subsequently released to his primary care physician. He stopped taking all medications and hasn't been back to a physician since then.  He states that several months ago he began to experience worsening exertional shortness of breath, and over the last few weeks he has developed substernal chest discomfort with exertion. These symptoms continued to worsen and the patient began to experience nocturnal shortness of breath and chest discomfort. This past Saturday night he had prolonged episode of chest discomfort and shortness of breath.  Yesterday  morning when he awoke for chest pain returned and became more severe ultimately prompting the patient to present to the emergency room where chest pain was relieved with administration of nitroglycerin. Initial ECG reveals slight ST segment elevation in lead to but repeat ECG demonstrated that the findings had resolved.  However, the patient continued to have recurrent episodes of chest pain despite escalating doses of intravenous nitroglycerin, and troponin levels notably increased from 0.472 2.0 to. He was subsequently brought to the Cath Lab last night by Dr. Allyson Sabal where he was found to have severe three-vessel coronary artery disease with what appeared to be acute occlusion of the terminal portions of the distal right coronary artery beyond the bifurcation. He underwent PCI and stenting of the right coronary artery but this procedure was complicated by acute coronary dissection of the proximal right coronary artery requiring multiple long overlapping drug-eluting stents. Ultimately the procedure was completed and the patient has remained stable since no recurrent chest pain. During the procedure the patient was loaded with Brilinta.  Post catheterization the patient developed ongoing bleeding from his right groin. This has subsequently stopped.  Cardiothoracic surgical consultation was requested to consider surgical revascularization.  The patient has been seen in consultation and counseled at length regarding the indications, risks and potential benefits of surgery.  All questions have been answered, and the patient provides full informed consent for the operation as described.     DETAILS OF THE OPERATIVE PROCEDURE  The patient is brought to the operating room on the above mentioned date and central monitoring was established by the anesthesia team including placement of Swan-Ganz catheter and radial arterial line. The patient is placed in the supine position on the operating table.  Intravenous  antibiotics are administered. General endotracheal anesthesia is induced uneventfully. A Foley catheter is placed.  Baseline transesophageal echocardiogram was performed.  Findings were notable for normal LV systolic function.  The patient's chest, abdomen, both groins, and both lower extremities are prepared and draped in a sterile manner. A time out procedure is performed.  A median sternotomy incision was performed and the left internal mammary artery is dissected from the chest wall and prepared for bypass grafting. The left internal mammary artery is notably good quality conduit. Simultaneously, saphenous vein is obtained from the patient's right thigh using endoscopic vein harvest technique. The saphenous vein is notably good quality conduit. After removal of the saphenous vein, the small surgical incisions in the lower extremity are closed with absorbable suture. Following systemic heparinization, the left internal mammary artery was transected distally noted to have excellent flow.  The pericardium is opened. The ascending aorta is normal in appearance. The ascending aorta and the right atrium are cannulated for cardioplegia bypass.  Adequate heparinization is verified.     The entire pre-bypass portion of the operation was notable for stable hemodynamics.  Cardiopulmonary bypass was begun and the surface of the heart is inspected. Distal target vessels are selected for coronary artery bypass grafting. A cardioplegia cannula is placed in the ascending aorta.  A temperature probe was placed in the interventricular septum.  The patient is allowed to cool passively to [facility] systemic temperature.  The aortic cross clamp is applied and cold blood cardioplegia is delivered initially in an antegrade fashion through the aortic root.  Iced saline slush is applied for topical hypothermia.  The initial cardioplegic arrest is rapid with early diastolic arrest.  Repeat doses of cardioplegia are administered  intermittently throughout the entire cross clamp portion of the operation through the aortic root and through subsequently placed vein grafts in order to maintain completely flat electrocardiogram and septal myocardial temperature below 15C.  Myocardial protection was felt to be excellent.  The following distal coronary artery bypass grafts were performed:   The posterior descending branch of the right coronary artery was grafted using a reversed saphenous vein graft in an end-to-side fashion.  At the site of distal anastomosis the target vessel was poor quality and measured approximately 1.0 mm in diameter.  The first diagonal l branch of the left anterior descending coronary artery was grafted using a reversed saphenous vein graft in an end-to-side fashion.  At the site of distal anastomosis the target vessel was good quality and measured approximately 2.0 mm in diameter.  The distal left anterior coronary artery was grafted with the left internal mammary artery in an end-to-side fashion.  At the site of distal anastomosis the target vessel was fair to good quality and measured approximately 2.0 mm in diameter.   All proximal vein graft anastomoses were placed directly to the ascending aorta prior to removal of the aortic cross clamp.  The septal myocardial temperature rose rapidly after reperfusion of the left internal mammary artery graft.  The aortic cross clamp was removed after a total cross clamp time of 77 minutes.  All proximal and distal coronary anastomoses were inspected for hemostasis and appropriate graft orientation. Epicardial pacing wires are fixed to the right ventricular outflow tract and to the right atrial appendage. The patient is rewarmed to 37C temperature. The patient is weaned and disconnected from cardiopulmonary bypass.  The patient's rhythm at separation from bypass was normal sinus.  The patient was weaned from cardioplegic bypass without any  inotropic support. Total  cardiopulmonary bypass time for the operation was 93 minutes.  Followup transesophageal echocardiogram performed after separation from bypass revealed no changes from the preoperative exam.  The aortic and venous cannula were removed uneventfully. Protamine was administered to reverse the anticoagulation. The mediastinum and pleural space were inspected for hemostasis and irrigated with saline solution. The mediastinum and the left pleural space were drained using 3 chest tubes placed through separate stab incisions inferiorly.  The soft tissues anterior to the aorta were reapproximated loosely. The sternum is closed with double strength sternal wire. The soft tissues anterior to the sternum were closed in multiple layers and the skin is closed with a running subcuticular skin closure.  The post-bypass portion of the operation was notable for stable rhythm and hemodynamics.  No blood products were administered during the operation.  The patient tolerated the procedure well and is transported to the surgical intensive care in stable condition. There are no intraoperative complications. All sponge instrument and needle counts are verified correct at completion of the operation.    Salvatore Decent. Cornelius Moras MD 07/04/2012 1:39 PM

## 2012-07-04 NOTE — Brief Op Note (Addendum)
06/25/2012 - 07/04/2012  12:15 PM  PATIENT:  Barry [name]  57 y.o. male  PRE-OPERATIVE DIAGNOSIS:  CAD  POST-OPERATIVE DIAGNOSIS:  CAD  PROCEDURE:  Procedure(s): CORONARY ARTERY BYPASS GRAFTING (CABG)x 3 LIMA-LAD; SVG-DIAG; SVG-PD INTRAOPERATIVE TRANSESOPHAGEAL ECHOCARDIOGRAM [facility] RIGHT THIGH  SURGEON:    [name], MD  ASSISTANTS:  Rowe Clack, PA-C  ANESTHESIA:   Judie Petit, MD  CROSSCLAMP TIME:   68'  CARDIOPULMONARY BYPASS TIME: 73'  FINDINGS:  Normal LV systolic function  Good quality LIMA and SVG conduit for grafting  Good quality LAD and D1 target vessels for grafting  Poor quality PDA target vessel for grafting  COMPLICATIONS: none  PATIENT DISPOSITION:   TO SICU IN STABLE CONDITION  [name] H 07/04/2012 1:32 PM     PRE-OPERATIVE WEIGHT: 121kg

## 2012-07-04 NOTE — Anesthesia Procedure Notes (Addendum)
Procedures  Sawn pulled back to 60cm without difficulty to great tracing from wedged tracing. CE

## 2012-07-04 NOTE — Anesthesia Preprocedure Evaluation (Signed)
Anesthesia Evaluation  Patient identified by MRN, date of birth, ID band Patient awake    Reviewed: Allergy & Precautions, H&P , NPO status , Patient's Chart, lab work & pertinent test results  Airway Mallampati: II      Dental   Pulmonary sleep apnea ,          Cardiovascular + CAD and + Past MI     Neuro/Psych    GI/Hepatic negative GI ROS, Neg liver ROS,   Endo/Other  diabetes  Renal/GU negative Renal ROS     Musculoskeletal   Abdominal   Peds  Hematology   Anesthesia Other Findings   Reproductive/Obstetrics                           Anesthesia Physical Anesthesia Plan  ASA: IV  Anesthesia Plan: General   Post-op Pain Management:    Induction: Intravenous  Airway Management Planned: Oral ETT  Additional Equipment: Arterial line and PA Cath  Intra-op Plan:   Post-operative Plan: Post-operative intubation/ventilation  Informed Consent: I have reviewed the patients History and Physical, chart, labs and discussed the procedure including the risks, benefits and alternatives for the proposed anesthesia with the patient or authorized representative who has indicated his/her understanding and acceptance.   Dental advisory given  Plan Discussed with: CRNA and Anesthesiologist  Anesthesia Plan Comments:         Anesthesia Quick Evaluation

## 2012-07-04 NOTE — Progress Notes (Signed)
Pt telemetry revealed 4.28 pause followed by artifact.  Pt denies any complaints with stable VS.  Pre op lopressor was held due to bradycardia with rate in 50s.  Cards paged and Ward Givens notified.  Will transport on tele with RN to OR.

## 2012-07-04 NOTE — Transfer of Care (Signed)
Immediate Anesthesia Transfer of Care Note  Patient: Barry [name]  Procedure(s) Performed: Procedure(s) with comments: CORONARY ARTERY BYPASS GRAFTING (CABG) (N/A) - x3 using right greater saphenous vein and left internal mammary.  INTRAOPERATIVE TRANSESOPHAGEAL ECHOCARDIOGRAM (N/A)  Patient Location: SICU  Anesthesia Type:General  Level of Consciousness: unresponsive  Airway & Oxygen Therapy: Patient remains intubated per anesthesia plan and Patient placed on Ventilator (see vital sign flow sheet for setting)  Post-op Assessment: Report given to PACU RN and Post -op Vital signs reviewed and stable  Post vital signs: Reviewed and stable  Complications: No apparent anesthesia complications

## 2012-07-04 NOTE — Progress Notes (Signed)
CPAP machine set up in room on auto titrate with FFM.  Pt. States he is able to placed himself on/off as needed. Encouraged pt. To call RT if needed.  RN aware.

## 2012-07-04 NOTE — Progress Notes (Signed)
TCTS BRIEF SICU PROGRESS NOTE  Day of Surgery  S/P Procedure(s) (LRB): CORONARY ARTERY BYPASS GRAFTING (CABG) (N/A) INTRAOPERATIVE TRANSESOPHAGEAL ECHOCARDIOGRAM (N/A)   Extubated uneventfully Awake and alert, breathing comfortably NSR w/ stable hemodynamics Chest tube output low UOP excellent Labs okay  Plan: Continue routine early postop.  Resume CPAP at bedtime for sleep  OWEN,CLARENCE H 07/04/2012 7:14 PM

## 2012-07-04 NOTE — Procedures (Signed)
Extubation Procedure Note  Patient Details:   Name: OSRIC KLOPF DOB: 1955/12/10 MRN: [id number]   Airway Documentation:     Evaluation  O2 sats: stable throughout Complications: No apparent complications Patient did tolerate procedure well. Bilateral Breath Sounds: Clear;Diminished   Yes NIF-22 [id number]   Newt Lukes 07/04/2012, 6:02 PM

## 2012-07-04 NOTE — Progress Notes (Signed)
  Echocardiogram Echocardiogram Transesophageal has been performed.  Chelsie Burel 07/04/2012, 11:01 AM

## 2012-07-05 ENCOUNTER — Inpatient Hospital Stay (HOSPITAL_COMMUNITY): Payer: 59

## 2012-07-05 LAB — CBC
HCT: 37.1 % — ABNORMAL LOW (ref 39.0–52.0)
Hemoglobin: 11.6 g/dL — ABNORMAL LOW (ref 13.0–17.0)
MCH: 29.5 pg (ref 26.0–34.0)
MCH: 29.6 pg (ref 26.0–34.0)
MCHC: 33.1 g/dL (ref 30.0–36.0)
MCHC: 33.2 g/dL (ref 30.0–36.0)
RDW: 13.7 % (ref 11.5–15.5)

## 2012-07-05 LAB — GLUCOSE, CAPILLARY
Glucose-Capillary: 93 mg/dL (ref 70–99)
Glucose-Capillary: 117 mg/dL — ABNORMAL HIGH (ref 70–99)
Glucose-Capillary: 106 mg/dL — ABNORMAL HIGH (ref 70–99)
Glucose-Capillary: 98 mg/dL (ref 70–99)

## 2012-07-05 LAB — BASIC METABOLIC PANEL
BUN: 9 mg/dL (ref 6–23)
Calcium: 8 mg/dL — ABNORMAL LOW (ref 8.4–10.5)
GFR calc non Af Amer: >90 mL/min (ref >90)
Glucose, Bld: 127 mg/dL — ABNORMAL HIGH (ref 70–99)
Sodium: 139 mEq/L (ref 135–145)

## 2012-07-05 LAB — CREATININE, SERUM: GFR calc non Af Amer: >90 mL/min (ref >90)

## 2012-07-05 LAB — MAGNESIUM: Magnesium: 2 mg/dL (ref 1.5–2.5)

## 2012-07-05 MED ORDER — MORPHINE SULFATE 2 MG/ML IJ SOLN
2.0000 mg | INTRAMUSCULAR | Status: DC | PRN
  Administered 2012-07-05 (×2): 2 mg via INTRAVENOUS

## 2012-07-05 MED ORDER — FUROSEMIDE 10 MG/ML IJ SOLN
INTRAMUSCULAR | Status: AC
  Administered 2012-07-05: 20 mg
  Filled 2012-07-05: qty 2

## 2012-07-05 MED ORDER — MOVING RIGHT ALONG BOOK
Freq: Once | Status: AC
  Administered 2012-07-05: 17:00:00
  Filled 2012-07-05: qty 1

## 2012-07-05 MED ORDER — INSULIN DETEMIR 100 UNIT/ML ~~LOC~~ SOLN
24.0000 [IU] | Freq: Two times a day (BID) | SUBCUTANEOUS | Status: DC
  Administered 2012-07-05 (×2): 24 [IU] via SUBCUTANEOUS
  Filled 2012-07-05 (×3): qty 0.24

## 2012-07-05 MED ORDER — FUROSEMIDE 40 MG PO TABS
40.0000 mg | ORAL_TABLET | Freq: Two times a day (BID) | ORAL | Status: DC
  Administered 2012-07-06 – 2012-07-07 (×3): 40 mg via ORAL
  Filled 2012-07-05 (×5): qty 1

## 2012-07-05 MED ORDER — ASPIRIN 81 MG PO CHEW
CHEWABLE_TABLET | ORAL | Status: AC
  Administered 2012-07-05: 81 mg
  Filled 2012-07-05: qty 1

## 2012-07-05 MED ORDER — CLOPIDOGREL BISULFATE 75 MG PO TABS
75.0000 mg | ORAL_TABLET | Freq: Every day | ORAL | Status: DC
  Administered 2012-07-06 – 2012-07-10 (×5): 75 mg via ORAL
  Filled 2012-07-05 (×8): qty 1

## 2012-07-05 MED ORDER — INSULIN ASPART 100 UNIT/ML ~~LOC~~ SOLN: 0.0000 [IU] | SUBCUTANEOUS | Status: DC

## 2012-07-05 MED ORDER — DIPHENHYDRAMINE HCL 25 MG PO TABS
25.0000 mg | ORAL_TABLET | Freq: Every evening | ORAL | Status: DC | PRN
  Administered 2012-07-08: 25 mg via ORAL
  Filled 2012-07-05 (×2): qty 1

## 2012-07-05 MED ORDER — POTASSIUM CHLORIDE CRYS ER 20 MEQ PO TBCR
20.0000 meq | EXTENDED_RELEASE_TABLET | Freq: Two times a day (BID) | ORAL | Status: DC
  Administered 2012-07-06 (×2): 20 meq via ORAL
  Filled 2012-07-05 (×4): qty 1

## 2012-07-05 MED ORDER — ATORVASTATIN CALCIUM 80 MG PO TABS
80.0000 mg | ORAL_TABLET | Freq: Every day | ORAL | Status: DC
  Administered 2012-07-06 – 2012-07-09 (×4): 80 mg via ORAL
  Filled 2012-07-05 (×5): qty 1

## 2012-07-05 MED ORDER — ASPIRIN EC 81 MG PO TBEC
81.0000 mg | DELAYED_RELEASE_TABLET | Freq: Every day | ORAL | Status: DC
  Administered 2012-07-06 – 2012-07-10 (×5): 81 mg via ORAL
  Filled 2012-07-05 (×6): qty 1

## 2012-07-05 MED ORDER — SODIUM CHLORIDE 0.9 % IV SOLN: 250.0000 mL | INTRAVENOUS | Status: DC | PRN

## 2012-07-05 MED ORDER — SODIUM CHLORIDE 0.9 % IJ SOLN
3.0000 mL | Freq: Two times a day (BID) | INTRAMUSCULAR | Status: DC
  Administered 2012-07-05 – 2012-07-09 (×3): 3 mL via INTRAVENOUS

## 2012-07-05 MED ORDER — TRAMADOL HCL 50 MG PO TABS
50.0000 mg | ORAL_TABLET | ORAL | Status: DC | PRN
  Administered 2012-07-09: 100 mg via ORAL
  Filled 2012-07-05: qty 2

## 2012-07-05 MED ORDER — INSULIN ASPART 100 UNIT/ML ~~LOC~~ SOLN
0.0000 [IU] | Freq: Three times a day (TID) | SUBCUTANEOUS | Status: DC
  Administered 2012-07-05: 2 [IU] via SUBCUTANEOUS
  Administered 2012-07-06 (×3): 4 [IU] via SUBCUTANEOUS
  Administered 2012-07-07: 8 [IU] via SUBCUTANEOUS
  Administered 2012-07-07: 4 [IU] via SUBCUTANEOUS
  Administered 2012-07-07: 2 [IU] via SUBCUTANEOUS
  Administered 2012-07-08 – 2012-07-09 (×4): 4 [IU] via SUBCUTANEOUS
  Administered 2012-07-09: 8 [IU] via SUBCUTANEOUS
  Administered 2012-07-09 – 2012-07-10 (×2): 2 [IU] via SUBCUTANEOUS

## 2012-07-05 MED ORDER — INSULIN ASPART 100 UNIT/ML ~~LOC~~ SOLN: 0.0000 [IU] | Freq: Three times a day (TID) | SUBCUTANEOUS | Status: DC

## 2012-07-05 MED ORDER — SODIUM CHLORIDE 0.9 % IJ SOLN: 3.0000 mL | INTRAMUSCULAR | Status: DC | PRN

## 2012-07-05 MED ORDER — FUROSEMIDE 10 MG/ML IJ SOLN
20.0000 mg | Freq: Four times a day (QID) | INTRAMUSCULAR | Status: AC
  Administered 2012-07-05 (×3): 20 mg via INTRAVENOUS
  Filled 2012-07-05 (×2): qty 2

## 2012-07-05 MED FILL — Sodium Chloride IV Soln 0.9%: INTRAVENOUS | Qty: 1000 | Status: AC

## 2012-07-05 MED FILL — Magnesium Sulfate Inj 50%: INTRAMUSCULAR | Qty: 10 | Status: AC

## 2012-07-05 MED FILL — Electrolyte-R (PH 7.4) Solution: INTRAVENOUS | Qty: 5000 | Status: AC

## 2012-07-05 MED FILL — Heparin Sodium (Porcine) Inj 1000 Unit/ML: INTRAMUSCULAR | Qty: 30 | Status: AC

## 2012-07-05 MED FILL — Heparin Sodium (Porcine) Inj 1000 Unit/ML: INTRAMUSCULAR | Qty: 10 | Status: AC

## 2012-07-05 MED FILL — Sodium Chloride Irrigation Soln 0.9%: Qty: 3000 | Status: AC

## 2012-07-05 MED FILL — Sodium Bicarbonate IV Soln 8.4%: INTRAVENOUS | Qty: 50 | Status: AC

## 2012-07-05 MED FILL — Potassium Chloride Inj 2 mEq/ML: INTRAVENOUS | Qty: 40 | Status: AC

## 2012-07-05 MED FILL — Mannitol IV Soln 20%: INTRAVENOUS | Qty: 500 | Status: AC

## 2012-07-05 MED FILL — Lidocaine HCl IV Inj 20 MG/ML: INTRAVENOUS | Qty: 5 | Status: AC

## 2012-07-05 NOTE — Progress Notes (Signed)
CARDIAC REHAB PHASE I   PRE:  Rate/Rhythm: 78 SR  BP:  Supine:   Sitting: 92/62  Standing:    SaO2: 95 3L  MODE:  Ambulation: 300 ft   POST:  Rate/Rhythm: 96 SR  BP:  Supine:   Sitting: 118/59  Standing:    SaO2: 95 3L 1425-1500  Assisted X 1 used walker and 02 3L to ambulate. Gait steady with walker. Pt is DOE.  He took one standing rest stop, slow pace. This was pt's first walk since surgery. Pt back to recliner after walk with call light in reach.  Melina Copa RN 07/05/2012 3:05 PM

## 2012-07-05 NOTE — Plan of Care (Signed)
Problem: Phase III Progression Outcomes Goal: Time patient transferred to PCTU/Telemetry POD Outcome: Completed/Met Date Met:  07/05/12 1245

## 2012-07-05 NOTE — Progress Notes (Signed)
      [address]       [name] [address]             [phone]        CARDIOTHORACIC SURGERY PROGRESS NOTE   R1 Day Post-Op Procedure(s) (LRB): CORONARY ARTERY BYPASS GRAFTING (CABG) (N/A) INTRAOPERATIVE TRANSESOPHAGEAL ECHOCARDIOGRAM (N/A)  Subjective: Doing very well.  Mild soreness in chest.  Objective: Vital signs: BP Readings from Last 1 Encounters:  07/05/12 101/52   Pulse Readings from Last 1 Encounters:  07/05/12 66   Resp Readings from Last 1 Encounters:  07/05/12 18   Temp Readings from Last 1 Encounters:  07/05/12 99.5 F (37.5 C) Core (Comment)    Hemodynamics: PAP: (28-50)/(11-28) 45/21 mmHg CO:  [4.4 L/min-6.6 L/min] 4.7 L/min CI:  [2 L/min/m2-3 L/min/m2] 2.9 L/min/m2  Physical Exam:  Rhythm:   sinus  Breath sounds: clear  Heart sounds:  RRR  Incisions:  Dressings dry, intact  Abdomen:  soft  Extremities:  warm   Intake/Output from previous day: 06/10 0701 - 06/11 0700 In: 5062.1 [P.O.:210; I.V.:3087.1; Blood:410; NG/GT:20; IV Piggyback:1335] Out: 5390 [Urine:3780; Blood:1200; Chest Tube:410] Intake/Output this shift: Total I/O In: 175 [P.O.:60; I.V.:60; IV Piggyback:55] Out: 60 [Urine:50; Chest Tube:10]  Lab Results:  Recent Labs  07/04/12 2030 07/04/12 2031 07/05/12 0400  WBC 10.9*  --  9.0  HGB 12.3* 12.2* 11.6*  HCT 36.4* 36.0* 35.0*  PLT 134*  --  135*   BMET:  Recent Labs  07/04/12 0414  07/04/12 2031 07/05/12 0400  NA 136  < > 141 139  K 4.0  < > 4.9 4.5  CL 103  --  108 107  CO2 27  --   --  24  GLUCOSE 258*  < > 143* 127*  BUN 13  --  9 9  CREATININE 0.94  < > 0.90 0.81  CALCIUM 9.1  --   --  8.0*  < > = values in this interval not displayed.  CBG (last 3)   Recent Labs  07/05/12 0457 07/05/12 0600 07/05/12 0749  GLUCAP 106* 98 79   ABG    Component Value Date/Time   PHART 7.346* 07/04/2012 1855   HCO3 24.0 07/04/2012 1855   TCO2 25 07/04/2012 2031   ACIDBASEDEF 2.0 07/04/2012  1855   O2SAT 97.0 07/04/2012 1855   CXR: Clear w/ very mild congestion and bibasilar atelectasis  Assessment/Plan: S/P Procedure(s) (LRB): CORONARY ARTERY BYPASS GRAFTING (CABG) (N/A) INTRAOPERATIVE TRANSESOPHAGEAL ECHOCARDIOGRAM (N/A)  Doing well POD1 Maintaining NSR w/ stable hemodynamics Expected post op acute blood loss anemia, mild, stable Expected post op volume excess, mild Expected post op atelectasis, mild Type II diabetes mellitus, excellent glycemic control on insulin drip Obesity OSA   Mobilize  Diuresis  D/C tubes and lines  Add levemir insulin and wean drip off  ASA + Plavix for full metal jacket in RCA  Transfer step down   OWEN,CLARENCE H 07/05/2012 8:29 AM

## 2012-07-05 NOTE — Anesthesia Postprocedure Evaluation (Signed)
  Anesthesia Post-op Note  Patient: Barry [name]  Procedure(s) Performed: Procedure(s) with comments: CORONARY ARTERY BYPASS GRAFTING (CABG) (N/A) - x3 using right greater saphenous vein and left internal mammary.  INTRAOPERATIVE TRANSESOPHAGEAL ECHOCARDIOGRAM (N/A)  Patient Location: SICU  Anesthesia Type:General  Level of Consciousness: awake, alert , oriented and patient cooperative  Airway and Oxygen Therapy: Patient Spontanous Breathing  Post-op Pain: none  Post-op Assessment: Post-op Vital signs reviewed, Patient's Cardiovascular Status Stable, Respiratory Function Stable, Patent Airway, No signs of Nausea or vomiting, Adequate PO intake and Pain level controlled  Post-op Vital Signs: Reviewed and stable  Complications: No apparent anesthesia complications

## 2012-07-05 NOTE — Progress Notes (Signed)
TCTS BRIEF PROGRESS NOTE  1 Day Post-Op  S/P Procedure(s) (LRB): CORONARY ARTERY BYPASS GRAFTING (CABG) (N/A) INTRAOPERATIVE TRANSESOPHAGEAL ECHOCARDIOGRAM (N/A)   Doing well following tranfer to 2000 NSR w/ stable BP Ambulating fairly well Diuresing fairly well  Plan: Continue routine care  Jaymar Loeber H 07/05/2012 5:02 PM

## 2012-07-05 NOTE — Progress Notes (Signed)
RT consult done, no new respiratory interventions indicated at this time. Pt to continue IS. RT will continue to monitor.

## 2012-07-06 ENCOUNTER — Encounter (HOSPITAL_COMMUNITY): Payer: Self-pay | Admitting: Thoracic Surgery (Cardiothoracic Vascular Surgery)

## 2012-07-06 ENCOUNTER — Inpatient Hospital Stay (HOSPITAL_COMMUNITY): Payer: 59

## 2012-07-06 LAB — BASIC METABOLIC PANEL
BUN: 12 mg/dL (ref 6–23)
CO2: 27 mEq/L (ref 19–32)
Calcium: 8.8 mg/dL (ref 8.4–10.5)
GFR calc non Af Amer: >90 mL/min (ref >90)
Glucose, Bld: 196 mg/dL — ABNORMAL HIGH (ref 70–99)

## 2012-07-06 LAB — GLUCOSE, CAPILLARY
Glucose-Capillary: 189 mg/dL — ABNORMAL HIGH (ref 70–99)
Glucose-Capillary: 163 mg/dL — ABNORMAL HIGH (ref 70–99)

## 2012-07-06 LAB — CBC
HCT: 34.9 % — ABNORMAL LOW (ref 39.0–52.0)
Hemoglobin: 11.7 g/dL — ABNORMAL LOW (ref 13.0–17.0)
MCH: 29.5 pg (ref 26.0–34.0)
MCHC: 33.5 g/dL (ref 30.0–36.0)
RBC: 3.96 MIL/uL — ABNORMAL LOW (ref 4.22–5.81)

## 2012-07-06 MED ORDER — METFORMIN HCL 500 MG PO TABS
500.0000 mg | ORAL_TABLET | Freq: Two times a day (BID) | ORAL | Status: DC
  Administered 2012-07-06 – 2012-07-07 (×3): 500 mg via ORAL
  Filled 2012-07-06 (×5): qty 1

## 2012-07-06 MED ORDER — ALTEPLASE 2 MG IJ SOLR
2.0000 mg | Freq: Once | INTRAMUSCULAR | Status: AC
  Administered 2012-07-06: 2 mg
  Filled 2012-07-06: qty 2

## 2012-07-06 MED ORDER — SODIUM CHLORIDE 0.9 % IJ SOLN
10.0000 mL | INTRAMUSCULAR | Status: DC | PRN
  Administered 2012-07-06: 20 mL
  Administered 2012-07-07 – 2012-07-09 (×5): 10 mL

## 2012-07-06 NOTE — Progress Notes (Signed)
CARDIAC REHAB PHASE I   PRE:  Rate/Rhythm: 84SR  BP:  Supine:   Sitting: 114/65  Standing:    SaO2: 96%3L  MODE:  Ambulation: 550 ft   POST:  Rate/Rhythm: 103ST  BP:  Supine:   Sitting: 108/59  Standing:    SaO2: 96%2L 0945-1025 Pt walked 550 ft on 2L with rolling walker and minimal asst. Tolerated increase in distance well. Did not need to rest. Slightly SOB but sats good. Decreased to 1L for room and notified RN. Very motivated to mobilize.   Luetta Nutting, RN BSN  07/06/2012 10:22 AM

## 2012-07-06 NOTE — Progress Notes (Addendum)
                   [address]            [name] [address]          [phone]      2 Days Post-Op Procedure(s) (LRB): CORONARY ARTERY BYPASS GRAFTING (CABG) (N/A) INTRAOPERATIVE TRANSESOPHAGEAL ECHOCARDIOGRAM (N/A)  Subjective: Patient just finished eating breakfast. No complaints.  Objective: Vital signs in last 24 hours: Temp:  [98 F (36.7 C)-99.5 F (37.5 C)] 98.8 F (37.1 C) (06/12 0418) Pulse Rate:  [64-89] 84 (06/12 0418) Cardiac Rhythm:  [-] Normal sinus rhythm (06/11 1950) Resp:  [15-19] 18 (06/12 0418) BP: (99-118)/(52-77) 118/77 mmHg (06/12 0418) SpO2:  [93 %-97 %] 95 % (06/12 0418) Arterial Line BP: (105-126)/(59) 105/59 mmHg (06/11 0900) FiO2 (%):  [32 %] 32 % (06/11 0825) Weight:  [122.6 kg (270 lb 4.5 oz)] 122.6 kg (270 lb 4.5 oz) (06/12 0418)  Pre op weight  121 kg Current Weight  07/06/12 122.6 kg (270 lb 4.5 oz)    Hemodynamic parameters for last 24 hours: PAP: (39-45)/(16-23) 39/16 mmHg  Intake/Output from previous day: 06/11 0701 - 06/12 0700 In: 655 [P.O.:540; I.V.:60; IV Piggyback:55] Out: 3110 [Urine:3100; Chest Tube:10]   Physical Exam:  Cardiovascular: RRR, no murmurs, gallops, or rubs. Pulmonary: Slightly diminished at bases; no rales, wheezes, or rhonchi. Abdomen: Soft, non tender, bowel sounds present. Extremities: Mild bilateral lower extremity edema. Wounds: Clean and dry.  No erythema or signs of infection.  Lab Results: CBC: Recent Labs  07/05/12 1655 07/06/12 0500  WBC 12.4* 11.3*  HGB 12.3* 11.7*  HCT 37.1* 34.9*  PLT 144* 120*   BMET:  Recent Labs  07/05/12 0400 07/05/12 1655 07/06/12 0500  NA 139  --  132*  K 4.5  --  3.9  CL 107  --  96  CO2 24  --  27  GLUCOSE 127*  --  196*  BUN 9  --  12  CREATININE 0.81 0.95 0.86  CALCIUM 8.0*  --  8.8    PT/INR:  Lab Results  Component Value Date   INR 1.33 07/04/2012   INR 1.03 07/03/2012   INR 0.94 06/25/2012   ABG:  INR: Will add last  result for INR, ABG once components are confirmed Will add last 4 CBG results once components are confirmed  Assessment/Plan:  1. CV - SR. On Lopressor 12.5 bid, Plavix 75 daily 2.  Pulmonary - CXR this am shows small bilateral pleural effusions, bibasilar atelectasis, cardiomegaly, and no pneumothorax.On 2-3 liters of oxygen via [place]. Wean as tolerates.Encourage incentive spirometer and flutter valve 3. Volume Overload - On Lasix 40 bid. Almost at pre op weight so likely decrease to daily in am. 4.  Acute blood loss anemia - H and H stable 11.7 and 34.9 5.DM-CBGs 140/180/180. Pre op HGA1C 10.3. On Insulin. Will stop and start Metformin as is tolerating a diet. Will need close surveillance as an outpatient. 6.Thrombocytopenia-platelets down to 120,000 7.Cont CRPI  ZIMMERMAN,DONIELLE MPA-C 07/06/2012,7:44 AM  I have seen and examined the patient and agree with the assessment and plan as outlined.  OWEN,CLARENCE H 07/06/2012 8:29 AM

## 2012-07-06 NOTE — Evaluation (Signed)
Physical Therapy Evaluation Patient Details Name: Barry [name] MRN: [id number] DOB: 1955-07-21 Today's Date: 07/06/2012 Time: 4098-1191 PT Time Calculation (min): 25 min  PT Assessment / Plan / Recommendation Clinical Impression  Pt is a pleasent 57 y.o. male s/p CABGx3.  Patient demonstrates some deficits in functional mobilty as indicated below. Will continue to see to address deficits and maximize independence. PT resides alone with minimal support in place, will need to be completely independent for discahrge. Rec ST Rehab prior to dc home to ensure safety and functional independence.    PT Assessment  Patient needs continued PT services    Follow Up Recommendations  CIR    Does the patient have the potential to tolerate intense rehabilitation      Barriers to Discharge Decreased caregiver support      Equipment Recommendations  Rolling walker with 5" wheels    Recommendations for Other Services Rehab consult   Frequency Min 4X/week    Precautions / Restrictions Precautions Precautions: Sternal Precaution Comments: spoke with patient at length regarding sternal precautions. Educated on techniques and positions to comply with precautiosn   Pertinent Vitals/Pain No pain at this time      Mobility  Bed Mobility Bed Mobility: Not assessed Details for Bed Mobility Assistance: received in bedside chair Transfers Transfers: Sit to Stand;Stand to Sit Sit to Stand: 4: Min guard;From chair/3-in-1 Stand to Sit: 4: Min guard;To chair/3-in-1 Details for Transfer Assistance: Performed x4 to educate and reinforce sternal precautions; educated on rocker technique for use of momentum to stand Ambulation/Gait Ambulation/Gait Assistance: 5: Supervision Ambulation Distance (Feet): 210 Feet Assistive device: Rolling walker Ambulation/Gait Assistance Details: Pt with some modest instability Gait Pattern: Step-through pattern;Trunk flexed;Narrow base of support Gait velocity:  decreased General Gait Details: some modest instability noted Stairs: No    Exercises General Exercises - Lower Extremity Ankle Circles/Pumps: AROM;Both;10 reps Long Arc Quad: AROM;Both;10 reps Hip ABduction/ADduction: AROM;Both;10 reps Hip Flexion/Marching: AROM;Both;10 reps   PT Diagnosis: Difficulty walking;Generalized weakness;Acute pain  PT Problem List: Decreased range of motion;Decreased activity tolerance;Decreased balance;Decreased mobility;Cardiopulmonary status limiting activity PT Treatment Interventions: DME instruction;Gait training;Stair training;Functional mobility training;Therapeutic activities;Therapeutic exercise;Patient/family education   PT Goals Acute Rehab PT Goals PT Goal Formulation: With patient Time For Goal Achievement: 07/20/12 Potential to Achieve Goals: Good  Visit Information  Last PT Received On: 07/06/12 Assistance Needed: +1    Subjective Data  Subjective: I feel pretty good Patient Stated Goal: to get back to complete independence   Prior Functioning  Home Living Lives With: Alone Type of Home: Apartment Home Access: Level entry Home Layout: One level Bathroom Shower/Tub: Engineer, manufacturing systems: Standard Home Adaptive Equipment: None Prior Function Level of Independence: Independent Able to Take Stairs?: Yes Driving: Yes Vocation: Full time employment Communication Communication: No difficulties Dominant Hand: Right    Cognition  Cognition Arousal/Alertness: Awake/alert Behavior During Therapy: WFL for tasks assessed/performed Overall Cognitive Status: Within Functional Limits for tasks assessed    Extremity/Trunk Assessment Right Upper Extremity Assessment RUE ROM/Strength/Tone: Deficits;Unable to fully assess;Due to precautions RUE ROM/Strength/Tone Deficits: sternal precautions RUE Sensation: WFL - Light Touch;WFL - Proprioception RUE Coordination: WFL - gross/fine motor Left Upper Extremity Assessment LUE  ROM/Strength/Tone: Deficits;Unable to fully assess;Due to precautions LUE ROM/Strength/Tone Deficits: sternal precautions LUE Sensation: WFL - Light Touch;WFL - Proprioception LUE Coordination: WFL - gross/fine motor Right Lower Extremity Assessment RLE ROM/Strength/Tone: WFL for tasks assessed RLE Sensation: WFL - Light Touch;WFL - Proprioception RLE Coordination: WFL - gross/fine motor Left Lower Extremity  Assessment LLE ROM/Strength/Tone: WFL for tasks assessed LLE Sensation: WFL - Light Touch;WFL - Proprioception LLE Coordination: WFL - gross/fine motor Trunk Assessment Trunk Assessment: Normal   Balance Balance Balance Assessed: Yes High Level Balance High Level Balance Activites: Side stepping;Backward walking;Direction changes;Turns;Sudden stops;Head turns High Level Balance Comments: increased time and modest deficits with high level balance performance (min guard close supervision)  End of Session PT - End of Session Equipment Utilized During Treatment: Gait belt Activity Tolerance: Patient tolerated treatment well Patient left: in chair;with call bell/phone within reach Nurse Communication: Mobility status  GP     Fabio Asa 07/06/2012, 2:50 PM Charlotte Crumb, PT DPT  [phone]

## 2012-07-06 NOTE — Progress Notes (Signed)
Pt ambulated 550 feet with rolling walker and RN; pt tolerated ambulation well; no rest breaks needed; O2 sats 94% RA while ambulating; will cont. To monitor; pt back to room to chair; call bell w/i reach.

## 2012-07-07 LAB — GLUCOSE, CAPILLARY
Glucose-Capillary: 224 mg/dL — ABNORMAL HIGH (ref 70–99)
Glucose-Capillary: 167 mg/dL — ABNORMAL HIGH (ref 70–99)
Glucose-Capillary: 151 mg/dL — ABNORMAL HIGH (ref 70–99)

## 2012-07-07 MED ORDER — POTASSIUM CHLORIDE CRYS ER 20 MEQ PO TBCR
20.0000 meq | EXTENDED_RELEASE_TABLET | Freq: Every day | ORAL | Status: DC
  Administered 2012-07-07 – 2012-07-10 (×4): 20 meq via ORAL
  Filled 2012-07-07 (×3): qty 1

## 2012-07-07 MED ORDER — METFORMIN HCL 850 MG PO TABS
850.0000 mg | ORAL_TABLET | Freq: Two times a day (BID) | ORAL | Status: DC
  Administered 2012-07-07 – 2012-07-10 (×5): 850 mg via ORAL
  Filled 2012-07-07 (×8): qty 1

## 2012-07-07 MED ORDER — FUROSEMIDE 40 MG PO TABS
40.0000 mg | ORAL_TABLET | Freq: Every day | ORAL | Status: DC
  Administered 2012-07-08 – 2012-07-10 (×3): 40 mg via ORAL
  Filled 2012-07-07 (×3): qty 1

## 2012-07-07 MED ORDER — ALTEPLASE 2 MG IJ SOLR
2.0000 mg | Freq: Once | INTRAMUSCULAR | Status: AC
  Administered 2012-07-07: 2 mg
  Filled 2012-07-07: qty 2

## 2012-07-07 NOTE — Progress Notes (Signed)
Pt stated he had restless night last night and would prefer not to wear CPAP tonight.  Pt currently on oxygen therapy of 2L via [place].  Pt told to call for respiratory if he changed his mind.

## 2012-07-07 NOTE — Progress Notes (Addendum)
      [address]       [place] [address]             [phone]         3 Days Post-Op Procedure(s) (LRB): CORONARY ARTERY BYPASS GRAFTING (CABG) (N/A) INTRAOPERATIVE TRANSESOPHAGEAL ECHOCARDIOGRAM (N/A)  Subjective: Patient passing flatus. Slept in chair as bed is not comfortable  Objective: Vital signs in last 24 hours: Temp:  [98.2 F (36.8 C)-99.7 F (37.6 C)] 98.3 F (36.8 C) (06/13 0512) Pulse Rate:  [78-93] 80 (06/13 0512) Cardiac Rhythm:  [-] Normal sinus rhythm (06/13 0735) Resp:  [18] 18 (06/13 0512) BP: (105-121)/(65-76) 107/69 mmHg (06/13 0512) SpO2:  [95 %-96 %] 96 % (06/13 0512) Weight:  [121.246 kg (267 lb 4.8 oz)] 121.246 kg (267 lb 4.8 oz) (06/13 0512)  Pre op weight  121 kg Current Weight  07/07/12 121.246 kg (267 lb 4.8 oz)      Intake/Output from previous day: 06/12 0701 - 06/13 0700 In: 480 [P.O.:480] Out: 1050 [Urine:1050]   Physical Exam:  Cardiovascular: RRR, no murmurs, gallops, or rubs. Pulmonary: Slightly diminished at bases; no rales, wheezes, or rhonchi. Abdomen: Soft, non tender, bowel sounds present. Extremities: Mild bilateral lower extremity edema. Wounds: Clean and dry.  No erythema or signs of infection.  Lab Results: CBC:  Recent Labs  07/05/12 1655 07/06/12 0500  WBC 12.4* 11.3*  HGB 12.3* 11.7*  HCT 37.1* 34.9*  PLT 144* 120*   BMET:   Recent Labs  07/05/12 0400 07/05/12 1655 07/06/12 0500  NA 139  --  132*  K 4.5  --  3.9  CL 107  --  96  CO2 24  --  27  GLUCOSE 127*  --  196*  BUN 9  --  12  CREATININE 0.81 0.95 0.86  CALCIUM 8.0*  --  8.8    PT/INR:  Lab Results  Component Value Date   INR 1.33 07/04/2012   INR 1.03 07/03/2012   INR 0.94 06/25/2012   ABG:  INR: Will add last result for INR, ABG once components are confirmed Will add last 4 CBG results once components are confirmed  Assessment/Plan:  1. CV - SR. On Lopressor 12.5 bid, Plavix 75 daily 2.  Pulmonary -  CXR this am shows small bilateral pleural effusions, bibasilar atelectasis, cardiomegaly, and no pneumothorax.Did not wear CPAP last night but was on 2 liters of oxygen via [place]. Weaned off oxygen during the day yesterday with good oxygen saturation.Encourage incentive spirometer and flutter valve 3. Volume Overload - On Lasix 40 bid. Almost at pre op weight so will decrease to daily. 4.  Acute blood loss anemia - Last H and H stable 11.7 and 34.9 5.DM-CBGs 167/174/224. Pre op HGA1C 10.3. Will increase Metformin for better glucose control. May need second agent.Will need close surveillance as an outpatient. 6.Thrombocytopenia-platelets down to 120,000 7.Cont CRPI 8.Remove EPW in am 9.Possible discharge 1-2 days  ZIMMERMAN,DONIELLE MPA-C 07/07/2012,8:18 AM    I have seen and examined the patient and agree with the assessment and plan as outlined.  Mr Speagle does not have anyone who can provide 24 hour/day care at the time of discharge.  Awaiting input from case manager.  He may need short term d/c to SNF  [facility] 07/07/2012 9:28 AM

## 2012-07-07 NOTE — Progress Notes (Signed)
Agree with PT treatment.    [place], [place] DPT [phone]

## 2012-07-07 NOTE — Progress Notes (Signed)
Pt states he is resting well without CPAP and will not likely wear tonight.  Pt told to contact RT if he changes his mind or needs assistance placing mask later.

## 2012-07-07 NOTE — Progress Notes (Signed)
Pt transferred from 2018 to 2036; report given to [place], [place].

## 2012-07-07 NOTE — Discharge Summary (Signed)
Physician Discharge Summary       [address]       [name] [address]             [phone]    Patient ID: Barry [name] MRN: [id number] DOB/AGE: Feb 14, 1955 57 y.o.  Admit date: 06/25/2012 Discharge date: 07/10/2012  Admission Diagnoses: 1. NSTEMI 2.Multivessel CAD (s/p PCI of RCA) 3.History of morbid obesity 4.History of OSA  Discharge Diagnoses:  1. NSTEMI 2.Multivessel CAD  (s/p PCI of RCA) 3.History of morbid obesity 4.History of OSA 5.Newly diagnosed DM 6.Newly diagnosed hyperlipidemia 7.Thrombocytopenia 8.ABL anemia   Procedure (s):  Cardiac Catheterization done by Dr. Allyson Sabal on 06/25/2012: 1. Left main; normal  2. LAD; 95-99% stenosis at the bifurcation of the first large side branch probably representing "in-stent restenosis with a proximal LAD stent.  3. Left circumflex; nondominant and normal.  4. Right coronary artery; dominant 75-80% segmental proximal, 80% mid and a total posterolateral branch representing the "culprit vessel"  5.LIMA was subselectively visualized a widely patent. It was suitable for use during cord artery bypass grafting if necessary  6. Left ventriculography; RAO left ventriculogram was performed using  25 mL of Visipaque dye at 12 mL/second. The overall LVEF estimated  50-55 % With wall motion abnormalities notable for mild inferobasal hypokinesia  7. Abdominal aortography: Renal arteries are widely patent. The infrarenal abdominal aorta and iliac bifurcation were free of significant atherosclerotic changes Successful PCI and stenting of the dominant RCA complicated by spiral dissection with 2 overlapping drug-eluting stents proximally and mid and a bare-metal stent distal.  Coronary Artery Bypass Grafting x 3  Left Internal Mammary Artery to Distal Left Anterior Descending Coronary Artery  Saphenous Vein Graft to Posterior Descending Coronary Artery  Sapheonous Vein Graft to Diagonal Branch Coronary Artery  Endoscopic Vein  Harvest from Right Thigh by Dr. Cornelius Moras on 07/04/2012   History of Presenting Illness: This is a 57 year old morbidly obese white male from [facility] with known history of coronary artery disease and recently discovered type 2 diabetes mellitus and hyperlipidemia was admitted to the hospital 06/25/2012 with an acute non-ST segment elevation myocardial infarction. The patient's cardiac history dates back to 2009 when he sustained an acute inferior wall myocardial infarction. He was treated at [facility] where he underwent PCI and stenting of the mid RCA by Dr. Bary Castilla. He was only seen in followup recently and subsequently released to his primary care physician. He stopped taking all medications and hasn't been back to a physician since then. He states that several months ago he began to experience worsening exertional shortness of breath, and over the last few weeks he has developed substernal chest discomfort with exertion. These symptoms continued to worsen and the patient began to experience nocturnal shortness of breath and chest discomfort. This past Saturday night, he had prolonged episode of chest discomfort and shortness of breath. Yesterday morning when he awoke for chest pain returned and became more severe ultimately prompting the patient to present to the emergency room where chest pain was relieved with administration of nitroglycerin. Initial ECG revealed slight ST segment elevation in lead to but repeat ECG demonstrated that the findings had resolved. However, the patient continued to have recurrent episodes of chest pain despite escalating doses of intravenous nitroglycerin, and troponin levels notably increased from 0.472 2.0 to. He was subsequently brought to the Cath Lab last night by Dr. Allyson Sabal where he was found to have severe three-vessel coronary artery disease  with what appeared to be acute occlusion of the terminal portions of the distal right coronary artery beyond the  bifurcation. He underwent PCI and stenting of the right coronary artery, but this procedure was complicated by acute coronary dissection of the proximal right coronary artery requiring multiple long overlapping drug-eluting stents. Ultimately, the procedure was completed and the patient has remained stable since no recurrent chest pain. During the procedure, the patient was loaded with Brilinta. Post catheterization the patient developed ongoing bleeding from his right groin. Brilinta has subsequently stopped. Cardiothoracic surgical consultation was requested to consider surgical revascularization. Potential risks, complications, and benefits of the surgery were discussed with the patient and he agreed to proceed. Pre operative duplex carotid US showed no significant internal stenosis bilaterally. ABI's were 1.34 on the right and 1.25. He underwent a CABG x 3 on 07/04/2012.   Brief Hospital Course:  The patient was extubated the evening of surgery without difficulty. He remained afebrile and hemodynamically stable. Theone Murdoch, a line, chest tubes, and foley were removed early in the post operative course. Lopressor was started and titrated accordingly. He was volume over loaded and diuresed. He was put on Plavix and enteric coated aspirin as he had PCI with stents to his RCA.He was weaned off the insulin drip. He is a newly diagnosed diabetic. Pre op HGA1C was 10.3 He was started on Metformin and this was titrated accordingly. His glucose has been monitored closely. He may need a second oral agent. He will require close follow up as an outpatient.The patient was felt surgically stable for transfer from the ICU to PCTU for further convalescence on 07/05/2012. He continues to progress with cardiac rehab. He initially required 2 liters oxygen via [place] and was able to be weaned to room air.He has been tolerating a diet and has had a bowel movement. Epicardial pacing wires and chest tube sutures will be removed prior to  discharge. He is felt surgically stable for discharge to SNF today.    Latest Vital Signs: Blood pressure 117/75, pulse 75, temperature 98.8 F (37.1 C), temperature source Oral, resp. rate 18, height 5\' 5"  (1.651 m), weight 119.659 kg (263 lb 12.8 oz), SpO2 95.00%.  Physical Exam: Cardiovascular: RRR, no murmurs, gallops, or rubs.  Pulmonary: Slightly diminished at bases; no rales, wheezes, or rhonchi.  Abdomen: Soft, non tender, bowel sounds present.  Extremities: Mild bilateral lower extremity edema.  Wounds: Clean and dry. No erythema or signs of infection.   Discharge Condition:Stable  Recent laboratory studies:  Lab Results  Component Value Date   WBC 11.3* 07/06/2012   HGB 11.7* 07/06/2012   HCT 34.9* 07/06/2012   MCV 88.1 07/06/2012   PLT 120* 07/06/2012   Lab Results  Component Value Date   NA 132* 07/06/2012   K 3.9 07/06/2012   CL 96 07/06/2012   CO2 27 07/06/2012   CREATININE 0.86 07/06/2012   GLUCOSE 196* 07/06/2012      Diagnostic Studies: Dg Chest 2 View  07/06/2012   *RADIOLOGY REPORT*  Clinical Data: Atelectasis.Postoperative day #2 following CABG.  CHEST - 2 VIEW  Comparison: 07/05/2012.  Findings: Cardiomegaly.  CABG.  Epicardial pacing leads remain present.  Mild basilar atelectasis.  Tiny bilateral pleural effusions.  Left upper extremity PICC is present with the tip at the cavoatrial junction. Monitoring leads are projected over the chest.  No airspace consolidation.  Subsegmental atelectasis or scarring in the right midlung.  Retrocardiac opacity is present extending inferior to the hemidiaphragm and overlying  the gastric air bubble, likely representing atelectasis. No pneumothorax.  The right IJ vascular sheath and Swan-Ganz catheter as well as the mediastinal drain and left thoracostomy tube have been removed.  IMPRESSION: Tiny bilateral pleural effusions, scattered areas of subsegmental atelectasis and cardiomegaly.  Stable left upper extremity PICC with removal  of other support apparatus.   Original Report Authenticated By: Andreas Newport, M.D.        Future Appointments Provider Department Dept Phone   08/07/2012 3:30 PM [name], MD [facility] [phone]     Discharge Medications:   Medication List    STOP taking these medications       ibuprofen 200 MG tablet  Commonly known as:  ADVIL,MOTRIN      TAKE these medications       aspirin 81 MG chewable tablet  Chew 324 mg by mouth once.     atorvastatin 80 MG tablet  Commonly known as:  LIPITOR  Take 1 tablet (80 mg total) by mouth daily at 6 PM.     clopidogrel 75 MG tablet  Commonly known as:  PLAVIX  Take 1 tablet (75 mg total) by mouth daily with breakfast.     diphenhydrAMINE 25 MG tablet  Commonly known as:  BENADRYL  Take 50 mg by mouth at bedtime as needed for itching or allergies.     glucose blood test strip  Commonly known as:  ACCU-CHEK ACTIVE STRIPS  Use as instructed     glucose monitoring kit monitoring kit  1 each by Does not apply route as needed for other.     living well with diabetes book Misc  1 each by Does not apply route once.     metFORMIN 1000 MG tablet  Commonly known as:  GLUCOPHAGE  Take 1 tablet (1,000 mg total) by mouth 2 (two) times daily with a meal.     metoprolol tartrate 25 MG tablet  Commonly known as:  LOPRESSOR  Take 0.5 tablets (12.5 mg total) by mouth 2 (two) times daily.     oxyCODONE 5 MG immediate release tablet  Commonly known as:  Oxy IR/ROXICODONE  Take 1-2 tablets (5-10 mg total) by mouth every 4 (four) hours as needed for pain.        The patient has been discharged on:   1.Beta Blocker:  Yes [ x  ]                              No   [   ]                              If No, reason:  2.Ace Inhibitor/ARB: Yes [   ]                                     No  [  x  ]                                     If No, reason:Labile blood pressure  3.Statin:   Yes [x   ]                   No  [   ]  If No, reason:  4.Ecasa:  Yes  [  x ]                  No   [   ]                  If No, reason:  Follow Up Appointments: Follow-up Information   Follow up with MCALHANY,CHRISTOPHER, MD. (Call for a follow up appointment for 2 weeks)    Contact information:   [address] [place] [address] [phone]       Follow up with [name], MD. (PA/LAT CXR to be taken (at [facility] which is in the same building as Dr. Orvan July office) on 08/07/2012 at 2:30pm;Appointment with Dr. Cornelius Moras is on 08/07/2012 at 3:30 pm)    Contact information:   [address] [employer] [address] [place] [address] [phone]       Follow up with Albertina Senegal, MD. (Call for a follow up appointment regarding further diabetes management)    Contact information:   [address] [place] [address] [phone]      Follow-up Information   Follow up with MCALHANY,CHRISTOPHER, MD. (Call for a follow up appointment for 2 weeks)    Contact information:   [address] [place] [address] [phone]       Follow up with [name], MD. (PA/LAT CXR to be taken (at [facility] which is in the same building as Dr. Orvan July office) on 08/07/2012 at 2:30pm;Appointment with Dr. Cornelius Moras is on 08/07/2012 at 3:30 pm)    Contact information:   [address] [employer] [address] [place] [address] [phone]       Follow up with Albertina Senegal, MD. (Call for a follow up appointment regarding further diabetes management)    Contact information:   [address] [place] [address] [phone]      Signed: Shawan Tosh MPA-C 07/10/2012, 3:45 PM

## 2012-07-07 NOTE — Progress Notes (Signed)
EPW to be pulled; pt states he feels as though he will be able to have BM without Dulcolax supp; will hold off on giving supp at this time; will administer when bedrest is up; last BM was pre-op; pt reminded of importance of having BM prior to d/c; will cont. To monitor.

## 2012-07-07 NOTE — Progress Notes (Signed)
EPW d/c per MD order; pt tolerated well; VSS; bedrest until 1130; will cont. To monitor.

## 2012-07-07 NOTE — Progress Notes (Signed)
Physical Therapy Treatment Patient Details Name: Barry [name] MRN: [id number] DOB: 05/14/1955 Today's Date: 07/07/2012 Time:  -     PT Assessment / Plan / Recommendation Comments on Treatment Session  Pt moving well.  States he would prefer to d/c directly home when MD feels medically ready.  Pt states he lives alone but he has friends/neighbors beside him that can help him if needed.  If pt d/c's directly home, he would benefit from HHPT & RW.      Follow Up Recommendations  Home health PT     Does the patient have the potential to tolerate intense rehabilitation     Barriers to Discharge        Equipment Recommendations  Rolling walker with 5" wheels    Recommendations for Other Services    Frequency Min 4X/week   Plan Discharge plan needs to be updated;Frequency remains appropriate    Precautions / Restrictions Precautions Precautions: Sternal       Mobility  Bed Mobility Bed Mobility: Supine to Sit;Sitting - Scoot to Edge of Bed;Sit to Supine Supine to Sit: 6: Modified independent (Device/Increase time) Sitting - Scoot to Edge of Bed: 6: Modified independent (Device/Increase time) Sit to Supine: 6: Modified independent (Device/Increase time) Transfers Transfers: Sit to Stand;Stand to Sit Sit to Stand: 6: Modified independent (Device/Increase time);Without upper extremity assist;From bed Stand to Sit: 6: Modified independent (Device/Increase time);Without upper extremity assist;To bed Details for Transfer Assistance: Performed 5x's.  Demonstrated proper technique & adherence to sternal precautions.   Ambulation/Gait Ambulation/Gait Assistance: 5: Supervision Ambulation Distance (Feet): 300 Feet Assistive device: Rolling walker Ambulation/Gait Assistance Details: (S) for safety but pt steady & demonstrates safe use of RW.   Gait Pattern: Step-through pattern;Decreased stride length Stairs: No Wheelchair Mobility Wheelchair Mobility: No      PT Goals Acute Rehab PT  Goals Time For Goal Achievement: 07/20/12 Potential to Achieve Goals: Good Pt will go Supine/Side to Sit: Independently PT Goal: Supine/Side to Sit - Progress: Goal set today Pt will go Sit to Supine/Side: Independently PT Goal: Sit to Supine/Side - Progress: Goal set today Pt will go Sit to Stand: Independently;without upper extremity assist PT Goal: Sit to Stand - Progress: Goal set today Pt will go Stand to Sit: Independently;without upper extremity assist PT Goal: Stand to Sit - Progress: Goal set today Pt will Ambulate: >150 feet;with least restrictive assistive device;with modified independence PT Goal: Ambulate - Progress: Goal set today  Visit Information  Last PT Received On: 07/07/12 Assistance Needed: +1    Subjective Data      Cognition  Cognition Arousal/Alertness: Awake/alert Behavior During Therapy: WFL for tasks assessed/performed Overall Cognitive Status: Within Functional Limits for tasks assessed    Balance     End of Session PT - End of Session Activity Tolerance: Patient tolerated treatment well Patient left: in bed;with call bell/phone within reach Nurse Communication: Mobility status     Verdell Face, [place] [phone] 07/07/2012

## 2012-07-07 NOTE — Progress Notes (Signed)
CARDIAC REHAB PHASE I   PRE:  Rate/Rhythm: 81 SR    BP: sitting 110/66    SaO2: 96 RA  MODE:  Ambulation: 700 ft   POST:  Rate/Rhythm: 106 ST    BP: sitting 111/64     SaO2: 96 RA  Tolerated very well with RW. No c/o. To bed for EPW pulling. Will f/u for ed. [id number]   Elissa Lovett Patmos CES, ACSM 07/07/2012 10:31 AM

## 2012-07-08 LAB — GLUCOSE, CAPILLARY
Glucose-Capillary: 167 mg/dL — ABNORMAL HIGH (ref 70–99)
Glucose-Capillary: 176 mg/dL — ABNORMAL HIGH (ref 70–99)

## 2012-07-08 NOTE — Progress Notes (Signed)
Pt ambulated in hallway 550 ft independently on Room Air and tolerated activity well. Pt oxygen sats remained 100 % on room air while ambulating. Will continue to monitor.

## 2012-07-08 NOTE — Progress Notes (Signed)
Pt ambulated in hallway 550 ft independently on Room Air. Pt tolerated activity well. Will continue to monitor and encourage.

## 2012-07-08 NOTE — Progress Notes (Addendum)
      [address]       [name] [address]             [phone]    4 Days Post-Op Procedure(s) (LRB): CORONARY ARTERY BYPASS GRAFTING (CABG) (N/A) INTRAOPERATIVE TRANSESOPHAGEAL ECHOCARDIOGRAM (N/A)  Subjective:  Barry [name] has no complaints this morning.  States he is just resting.  He is ambulating without difficulty.  He states he thinks he would be okay at home at discharge.  He states he spoke with Case management and they are unsure if he will be approved for SNF due to PT recommendations of Home at discharge. No BM, + Flatus  Objective: Vital signs in last 24 hours: Temp:  [98.6 F (37 C)-99.2 F (37.3 C)] 98.8 F (37.1 C) (06/14 0333) Pulse Rate:  [64-90] 64 (06/14 0333) Cardiac Rhythm:  [-] Normal sinus rhythm (06/14 0800) Resp:  [18-19] 19 (06/14 0333) BP: (104-119)/(61-79) 119/79 mmHg (06/14 0333) SpO2:  [91 %-97 %] 97 % (06/14 0333) Weight:  [265 lb 4.8 oz (120.339 kg)] 265 lb 4.8 oz (120.339 kg) (06/14 0333)  Intake/Output from previous day: 06/13 0701 - 06/14 0700 In: 483 [P.O.:480; I.V.:3] Out: 300 [Urine:300]  General appearance: alert, cooperative and no distress Heart: regular rate and rhythm Lungs: clear to auscultation bilaterally Abdomen: soft, non-tender; bowel sounds normal; no masses,  no organomegaly Extremities: edema trace Wound: clean and dry, RLE ecchymotic  Lab Results:  Recent Labs  07/05/12 1655 07/06/12 0500  WBC 12.4* 11.3*  HGB 12.3* 11.7*  HCT 37.1* 34.9*  PLT 144* 120*   BMET:  Recent Labs  07/05/12 1655 07/06/12 0500  NA  --  132*  K  --  3.9  CL  --  96  CO2  --  27  GLUCOSE  --  196*  BUN  --  12  CREATININE 0.95 0.86  CALCIUM  --  8.8    PT/INR: No results found for this basename: LABPROT, INR,  in the last 72 hours ABG    Component Value Date/Time   PHART 7.346* 07/04/2012 1855   HCO3 24.0 07/04/2012 1855   TCO2 25 07/04/2012 2031   ACIDBASEDEF 2.0 07/04/2012 1855   O2SAT 97.0 07/04/2012  1855   CBG (last 3)   Recent Labs  07/07/12 1659 07/07/12 2112 07/08/12 0602  GLUCAP 151* 151* 169*    Assessment/Plan: S/P Procedure(s) (LRB): CORONARY ARTERY BYPASS GRAFTING (CABG) (N/A) INTRAOPERATIVE TRANSESOPHAGEAL ECHOCARDIOGRAM (N/A)  1.  CV- NSR on Lopressor BID, Plavix for stents in place 2. Pulm- no acute issues, off oxygen encouraged use of IS 3. Renal- weight is stable back to baseline on Lasix 4. DM- CBGs pretty well controlled continue Metformin 5. Dispo- patient is doing well, need to make discharge arrangements home vs. SNF    LOS: 13 days    BARRETT, ERIN 07/08/2012  I have seen and examined the patient and agree with the assessment and plan as outlined.    Tima Curet H 07/08/2012 10:23 AM

## 2012-07-08 NOTE — Progress Notes (Signed)
Clinical Social Work Department BRIEF PSYCHOSOCIAL ASSESSMENT 07/08/2012  Patient:  Barry [name], Barry [name]     Account Number:  [id number]     Admit date:  06/25/2012  Clinical Social Worker:  Robin Searing  Date/Time:  07/08/2012 10:18 AM  Referred by:  Physician  Date Referred:  07/07/2012 Referred for  SNF Placement   Other Referral:   Interview type:  Patient Other interview type:    PSYCHOSOCIAL DATA Living Status:  ALONE Admitted from facility:   Level of care:   Primary support name:  brothers Primary support relationship to patient:  FAMILY Degree of support available:   minimal-  Patient reports that he and his wife are separated- she lives in [place]- he has a daughter in [place] and brothers in the [place] area    CURRENT CONCERNS Current Concerns  Post-Acute Placement   Other Concerns:    SOCIAL WORK ASSESSMENT / PLAN 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  He is agreeable to considering SNF at d/c as he lives alone-  SNF search initiated and will provide SNF offers as rec'd.  Patient appears somewhat withdrawn and possibly depressed- CSW will further assess this       Reece Levy, MSW, Theresia Majors [phone] weekend coverage

## 2012-07-08 NOTE — Progress Notes (Signed)
Clinical Social Work Department CLINICAL SOCIAL WORK PLACEMENT NOTE 07/08/2012  Patient:  Barry [name], Barry [name]  Account Number:  [id number] Admit date:  06/25/2012  Clinical Social Worker:  Robin Searing  Date/time:  07/08/2012 10:26 AM  Clinical Social Work is seeking post-discharge placement for this patient at the following level of care:   SKILLED NURSING   (*CSW will update this form in Epic as items are completed)   07/08/2012  Patient/family provided with [facility] Department of Clinical Social Work's list of facilities offering this level of care within the geographic area requested by the patient (or if unable, by the patient's family).  07/08/2012  Patient/family informed of their freedom to choose among providers that offer the needed level of care, that participate in Medicare, Medicaid or managed care program needed by the patient, have an available bed and are willing to accept the patient.  07/08/2012  Patient/family informed of [facility]' ownership interest in [facility], as well as of the fact that they are under no obligation to receive care at this facility.  PASARR submitted to EDS on 07/07/2012 PASARR number received from EDS on 07/07/2012  FL2 transmitted to all facilities in geographic area requested by pt/family on  07/07/2012 FL2 transmitted to all facilities within larger geographic area on   Patient informed that his/her managed care company has contracts with or will negotiate with  certain facilities, including the following:     Patient/family informed of bed offers received:   Patient chooses bed at  Physician recommends and patient chooses bed at    Patient to be transferred to  on   Patient to be transferred to facility by   The following physician request were entered in Epic:   Additional Comments: Reece Levy, MSW, LCSWA [phone]/weekend coverage

## 2012-07-08 NOTE — Progress Notes (Addendum)
Pt observed ambulating in hallway with nursing staff.  Pt denied any complaints.  Pt resting on side of bed.  In anticipation of possible discharged to SNF education completed.  Reviewed heart healthy diet, exercise guidelines with progression of activity as tolerated, reviewed sternal precautions and wound care.  Pt provided handouts, questions answered.  Pt verbalized understanding. Pt denies any needs at this time. 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

## 2012-07-09 LAB — GLUCOSE, CAPILLARY
Glucose-Capillary: 131 mg/dL — ABNORMAL HIGH (ref 70–99)
Glucose-Capillary: 249 mg/dL — ABNORMAL HIGH (ref 70–99)

## 2012-07-09 NOTE — Progress Notes (Signed)
Pt ambulated in hallway 550 ft independently on Room air. Pt tolerated activity well. Will continue to monitor.

## 2012-07-09 NOTE — Progress Notes (Addendum)
      [address]       [name] [address]             [phone]      5 Days Post-Op Procedure(s) (LRB): CORONARY ARTERY BYPASS GRAFTING (CABG) (N/A) INTRAOPERATIVE TRANSESOPHAGEAL ECHOCARDIOGRAM (N/A)  Subjective:  Barry [name] is doing well this morning.  He states he is feeling much better today.  He is ambulating independently. +BM  Objective: Vital signs in last 24 hours: Temp:  [98.3 F (36.8 C)-98.9 F (37.2 C)] 98.9 F (37.2 C) (06/15 0530) Pulse Rate:  [68-87] 80 (06/15 0530) Cardiac Rhythm:  [-] Normal sinus rhythm (06/15 0800) Resp:  [18-20] 18 (06/15 0530) BP: (113-125)/(55-83) 122/83 mmHg (06/15 0530) SpO2:  [93 %-100 %] 96 % (06/15 0530) Weight:  [263 lb 4.8 oz (119.432 kg)] 263 lb 4.8 oz (119.432 kg) (06/15 0530)  Intake/Output from previous day: 06/14 0701 - 06/15 0700 In: 720 [P.O.:720] Out: 200 [Urine:200] Intake/Output this shift: Total I/O In: 240 [P.O.:240] Out: -   General appearance: alert, cooperative and no distress Heart: regular rate and rhythm Lungs: clear to auscultation bilaterally Abdomen: soft, non-tender; bowel sounds normal; no masses,  no organomegaly Extremities: edema trace Wound: clean and dyr  Lab Results: No results found for this basename: WBC, HGB, HCT, PLT,  in the last 72 hours BMET: No results found for this basename: NA, K, CL, CO2, GLUCOSE, BUN, CREATININE, CALCIUM,  in the last 72 hours  PT/INR: No results found for this basename: LABPROT, INR,  in the last 72 hours ABG    Component Value Date/Time   PHART 7.346* 07/04/2012 1855   HCO3 24.0 07/04/2012 1855   TCO2 25 07/04/2012 2031   ACIDBASEDEF 2.0 07/04/2012 1855   O2SAT 97.0 07/04/2012 1855   CBG (last 3)   Recent Labs  07/08/12 1611 07/08/12 2130 07/09/12 0540  GLUCAP 178* 176* 131*    Assessment/Plan: S/P Procedure(s) (LRB): CORONARY ARTERY BYPASS GRAFTING (CABG) (N/A) INTRAOPERATIVE TRANSESOPHAGEAL ECHOCARDIOGRAM (N/A)  1. CV- NSR  on Lopressor BID, Plavix for stents 2. Pulm- no issues off oxygen, encouraged use of IS 3. Volume status- stable, weight at baseline on Lasix 4. DM- CBGs controlled, continue metformin 5. Dispo- patient doing well, lives alone awaiting insurance auth for SNF   LOS: 14 days    Barry [name] 07/09/2012  I have seen and examined the patient and agree with the assessment and plan as outlined.  OWEN,CLARENCE H 07/09/2012 10:31 AM

## 2012-07-09 NOTE — Progress Notes (Signed)
Patient refuses CPAP at this time.  Patient advised to contact RN or RT if he decides to wear.

## 2012-07-09 NOTE — Progress Notes (Signed)
Pt ambulating around entire unit twice.  Pt upset about MD wanting him to go to rehab.  Pt states he has a first level apartment with plenty of room and plenty of people he could call if he needed anything.  Pt is able to ambulate well, appears stable. Ambulating without assistance without difficulty, without sob. Barnett Hatter P

## 2012-07-09 NOTE — Progress Notes (Signed)
Pt ambulated in hallway 550 ft independently and tolerated activity well. Will continue to monitor. 

## 2012-07-09 NOTE — Progress Notes (Signed)
Patient ambulated approximately 550 ft independently in the hallway and tolerated well.  Left resting in room with call bell within reach.  Will continue to monitor.  Arva Chafe

## 2012-07-09 NOTE — Progress Notes (Signed)
Resp Care Note;Pt refusing CPAP tonight. 

## 2012-07-10 LAB — GLUCOSE, CAPILLARY

## 2012-07-10 MED ORDER — GLUCOSE BLOOD VI STRP: ORAL_STRIP | Status: DC

## 2012-07-10 MED ORDER — OXYCODONE HCL 5 MG PO TABS: 5.0000 mg | ORAL_TABLET | ORAL | Status: DC | PRN

## 2012-07-10 MED ORDER — METOPROLOL TARTRATE 25 MG PO TABS: 12.5000 mg | ORAL_TABLET | Freq: Two times a day (BID) | ORAL | Status: DC

## 2012-07-10 MED ORDER — METFORMIN HCL 1000 MG PO TABS: 1000.0000 mg | ORAL_TABLET | Freq: Two times a day (BID) | ORAL | Status: DC

## 2012-07-10 MED ORDER — CLOPIDOGREL BISULFATE 75 MG PO TABS: 75.0000 mg | ORAL_TABLET | Freq: Every day | ORAL | Status: DC

## 2012-07-10 MED ORDER — FREESTYLE SYSTEM KIT: 1.0000 | PACK | Status: DC | PRN

## 2012-07-10 MED ORDER — METFORMIN HCL 500 MG PO TABS
1000.0000 mg | ORAL_TABLET | Freq: Two times a day (BID) | ORAL | Status: DC
  Filled 2012-07-10 (×2): qty 2

## 2012-07-10 MED ORDER — LIVING WELL WITH DIABETES BOOK
Freq: Once | Status: AC
  Administered 2012-07-10: 13:00:00
  Filled 2012-07-10: qty 1

## 2012-07-10 MED ORDER — ATORVASTATIN CALCIUM 80 MG PO TABS: 80.0000 mg | ORAL_TABLET | Freq: Every day | ORAL | Status: DC

## 2012-07-10 MED ORDER — LIVING WELL WITH DIABETES BOOK: 1.0000 | Freq: Once | Status: DC

## 2012-07-10 NOTE — Progress Notes (Signed)
Patient for d/c today to SNF bed at [facility]. Patient agreeable to this plan- plan transfer via EMS. Reece Levy, MSW, Theresia Majors [phone]

## 2012-07-10 NOTE — Progress Notes (Signed)
Pt watched DM videos #[id number]. No further questions at this time.

## 2012-07-10 NOTE — Progress Notes (Addendum)
Physical Therapy Treatment/ Discharge Patient Details Name: Barry [name] MRN: [id number] DOB: 16-Aug-1955 Today's Date: 07/10/2012 Time: 4098-1191 PT Time Calculation (min): 25 min  PT Assessment / Plan / Recommendation Comments on Treatment Session  Pt s/p CABG who is able to perform all transfers and gait without assist and educated for sternal precautions as well as changes with return home post-op. Pt without further therapy needs at this time educated to complete 3 walks a day along with HEP and pt verbalized understanding and recommend outpatient cardiac rehab. Pt has met goals and agreeable to no further therapy needs.    Follow Up Recommendations  No PT follow up, outpatient cardiac rehab     Does the patient have the potential to tolerate intense rehabilitation     Barriers to Discharge        Equipment Recommendations  None recommended by PT    Recommendations for Other Services    Frequency     Plan Discharge plan needs to be updated    Precautions / Restrictions Precautions Precautions: Sternal Restrictions Weight Bearing Restrictions: No   Pertinent Vitals/Pain No pain HR 92 and sats 95% on RA with gait    Mobility  Bed Mobility Supine to Sit: 6: Modified independent (Device/Increase time);HOB flat Sitting - Scoot to Edge of Bed: 7: Independent Sit to Supine: 6: Modified independent (Device/Increase time);HOB flat Transfers Sit to Stand: 5: Supervision;From chair/3-in-1;From bed Stand to Sit: 7: Independent;To bed;To chair/3-in-1 Details for Transfer Assistance: cueing for hand placement x 1 and without cues second trial Ambulation/Gait Ambulation/Gait Assistance: 7: Independent Ambulation Distance (Feet): 650 Feet Assistive device: None Gait Pattern: Within Functional Limits Gait velocity: 35ft/14sec= 2.14 ft/sec which is safe as a neighborhood ambulator Stairs: No    Exercises General Exercises - Lower Extremity Long Arc Quad: AROM;Both;20  reps;Seated Hip Flexion/Marching: AROM;Both;20 reps;Seated Toe Raises: AROM;Both;20 reps;Seated Heel Raises: AROM;Both;20 reps;Seated   PT Diagnosis:    PT Problem List:   PT Treatment Interventions:     PT Goals Acute Rehab PT Goals Pt will go Supine/Side to Sit: with modified independence;with HOB 0 degrees PT Goal: Supine/Side to Sit - Progress: Met PT Goal: Sit to Supine/Side - Progress: Met Pt will go Sit to Stand: with modified independence PT Goal: Sit to Stand - Progress: Other (comment) (revised due to safe for discharge) PT Goal: Stand to Sit - Progress: Met PT Goal: Ambulate - Progress: Met  Visit Information  Last PT Received On: 07/10/12 Assistance Needed: +1    Subjective Data  Subjective: I will have people who can drop in anytime   Cognition  Cognition Arousal/Alertness: Awake/alert Behavior During Therapy: WFL for tasks assessed/performed Overall Cognitive Status: Within Functional Limits for tasks assessed    Balance     End of Session PT - End of Session Activity Tolerance: Patient tolerated treatment well Patient left: in chair;with call bell/phone within reach   GP     Delorse Lek 07/10/2012, 10:34 AM Delaney Meigs, PT [phone]

## 2012-07-10 NOTE — Progress Notes (Signed)
Updated SNF that we are awaiting d/c summary/meds and d/c order- their cut off for admit today is 4pm- if not completed by then, will have to plan for tomorrow-  Reece Levy, MSW, Theresia Majors [phone]

## 2012-07-10 NOTE — Progress Notes (Addendum)
      [address]       [place] [address]             [phone]         6 Days Post-Op Procedure(s) (LRB): CORONARY ARTERY BYPASS GRAFTING (CABG) (N/A) INTRAOPERATIVE TRANSESOPHAGEAL ECHOCARDIOGRAM (N/A)  Subjective: Patient feels well and really wants to go home  Objective: Vital signs in last 24 hours: Temp:  [98.4 F (36.9 C)-99.7 F (37.6 C)] 99.7 F (37.6 C) (06/16 0505) Pulse Rate:  [76-77] 77 (06/16 0505) Cardiac Rhythm:  [-] Normal sinus rhythm (06/15 2000) Resp:  [18-19] 19 (06/16 0505) BP: (105-127)/(70-81) 117/79 mmHg (06/16 0505) SpO2:  [93 %-95 %] 95 % (06/16 0505) Weight:  [119.659 kg (263 lb 12.8 oz)] 119.659 kg (263 lb 12.8 oz) (06/16 0505)  Pre op weight  121 kg Current Weight  07/10/12 119.659 kg (263 lb 12.8 oz)      Intake/Output from previous day: 06/15 0701 - 06/16 0700 In: 720 [P.O.:720] Out: 300 [Urine:300]   Physical Exam:  Cardiovascular: RRR, no murmurs, gallops, or rubs. Pulmonary: Slightly diminished at bases; no rales, wheezes, or rhonchi. Abdomen: Soft, non tender, bowel sounds present. Extremities: Mild bilateral lower extremity edema. Wounds: Clean and dry.  No erythema or signs of infection.  Lab Results: CBC: No results found for this basename: WBC, HGB, HCT, PLT,  in the last 72 hours BMET:  No results found for this basename: NA, K, CL, CO2, GLUCOSE, BUN, CREATININE, CALCIUM,  in the last 72 hours  PT/INR:  Lab Results  Component Value Date   INR 1.33 07/04/2012   INR 1.03 07/03/2012   INR 0.94 06/25/2012   ABG:  INR: Will add last result for INR, ABG once components are confirmed Will add last 4 CBG results once components are confirmed  Assessment/Plan:  1. CV - SR. On Lopressor 12.5 bid, Plavix 75 daily 2.  Pulmonary - Encourage incentive spirometer and flutter valve 3. Volume Overload - On Lasix 40 daily 4.  Acute blood loss anemia - Last H and H stable 11.7 and 34.9 5.DM-CBGs  168/185/177. Pre op HGA1C 10.3. On Metformin 850 bid. Will increase to 1000 bid for better glucose control.Will need close surveillance as an outpatient. 6.Thrombocytopenia-last platelets 120,000 7.Cont CRPI 8.Await to see if able to go to SNF; if not, home with home health  ZIMMERMAN,DONIELLE MPA-C 07/10/2012,7:37 AM     I have seen and examined the patient and agree with the assessment and plan as outlined.  Ready for d/c  OWEN,CLARENCE H 07/10/2012 8:50 AM

## 2012-07-10 NOTE — Progress Notes (Signed)
CARDIAC REHAB PHASE I   PRE:  Rate/Rhythm: 66 sr  BP:  Supine:   Sitting: 94/60  Standing:    SaO2: 92 RA  MODE:  Ambulation: 1040 ft   POST:  Rate/Rhythm: 87  BP:  Supine:   Sitting: 144/74  Standing:    SaO2: 94 RA 1300-1323 Pt tolerated ambulation well without c/o. VS stable. Pt back to recliner after walk with call light in reach.  Melina Copa RN 07/10/2012 1:21 PM

## 2012-07-10 NOTE — Plan of Care (Signed)
Problem: Food- and Nutrition-Related Knowledge Deficit (NB-1.1) Goal: Nutrition education Formal process to instruct or train a patient/client in a skill or to impart knowledge to help patients/clients voluntarily manage or modify food choices and eating behavior to maintain or improve health. Outcome: Completed/Met Date Met:  07/10/12  RD consulted for nutrition education regarding diabetes. Pt with new dx of DM at this admission. Pt planned to d/c to rehab facility prior to returning home, stated he will work on his diet while at rehab.   Pt would likely benefit from out patient diabetes classes.     Lab Results  Component Value Date    HGBA1C 10.3* 06/26/2012    RD provided "Carbohydrate Counting for People with Diabetes" handout from the Academy of Nutrition and Dietetics. Discussed different food groups and their effects on blood sugar, emphasizing carbohydrate-containing foods. Provided list of carbohydrates and recommended serving sizes of common foods.  Discussed importance of controlled and consistent carbohydrate intake throughout the day. Provided examples of ways to balance meals/snacks and encouraged intake of high-fiber, whole grain complex carbohydrates. Teach back method used.  Expect fair compliance.  Body mass index is 43.9 kg/(m^2). Pt meets criteria for obesity class 3, extreme based on current BMI.  Current diet order is Carb Mod Medium, patient is consuming approximately 75-100% of meals at this time. Labs and medications reviewed. No further nutrition interventions warranted at this time. RD contact information provided. If additional nutrition issues arise, please re-consult RD.  Barry [name] RD, LDN Pager [phone] After Hours pager [phone]

## 2012-07-10 NOTE — Progress Notes (Signed)
SNF bed offers provided to patient- he is interested in [facility] as they can provide a private room and it is near his home- Confirmed with patient and will plan transfer once d/c summary is complete- updated charge nurse to above-  Reece Levy, MSW, Theresia Majors [phone]

## 2012-07-10 NOTE — Progress Notes (Signed)
CTS removed, pt tolerated well. In chair resting; will continue to monitor.

## 2012-07-10 NOTE — Progress Notes (Signed)
Report called and given to RN at [facility]. Pt informed of discharge and given discharge instructions. PICC (via IV team) and tele box removed, CTS removed and incision cleaned with betadine. Pt ready for DC via EMS.

## 2012-07-11 ENCOUNTER — Other Ambulatory Visit: Payer: Self-pay | Admitting: *Deleted

## 2012-07-11 MED ORDER — OXYCODONE HCL 5 MG PO TABS: ORAL_TABLET | ORAL | Status: DC

## 2012-07-12 ENCOUNTER — Non-Acute Institutional Stay (SKILLED_NURSING_FACILITY): Payer: 59 | Admitting: Nurse Practitioner

## 2012-07-12 ENCOUNTER — Encounter: Payer: Self-pay | Admitting: Nurse Practitioner

## 2012-07-12 DIAGNOSIS — I2510 Atherosclerotic heart disease of native coronary artery without angina pectoris: Secondary | ICD-10-CM

## 2012-07-12 DIAGNOSIS — E1165 Type 2 diabetes mellitus with hyperglycemia: Secondary | ICD-10-CM

## 2012-07-12 DIAGNOSIS — Z951 Presence of aortocoronary bypass graft: Secondary | ICD-10-CM

## 2012-07-12 DIAGNOSIS — IMO0001 Reserved for inherently not codable concepts without codable children: Secondary | ICD-10-CM

## 2012-07-12 NOTE — Progress Notes (Signed)
Patient ID: Barry [name], male   DOB: 06-13-1955, 57 y.o.   MRN: [id number]   PCP: Albertina Senegal, MD  Code Status: full code No Known Allergies  Chief Complaint: follow up hospitalization   HPI:  This is a 57 year old morbidly obese white male from [facility] with known history of coronary artery disease and recently discovered type 2 diabetes mellitus and hyperlipidemia was admitted to the hospital 06/25/2012 with an acute non-ST segment elevation myocardial infarction. The patient's cardiac history dates back to 2009 when he sustained an acute inferior wall myocardial infarction. He was treated at [facility] where he underwent PCI and stenting of the mid RCA by Dr. Bary Castilla. He was only seen in followup recently and subsequently released to his primary care physician. He stopped taking all medications and hasn't been back to a physician since then. He states that several months ago he began to experience worsening exertional shortness of breath, and over the last few weeks he has developed substernal chest discomfort with exertion. These symptoms continued to worsen and the patient began to experience nocturnal shortness of breath and chest discomfort. Due to prolonged episode of chest discomfort and shortness of breath when he awoke; chest pain returned and became more severe ultimately prompting the patient to present to the emergency room where chest pain was relieved with administration of nitroglycerin.  Pt was hospitalized from  06/25/2012 -  07/10/2012.   He underwent a CABG x 3 on 07/04/2012. He progressed with cardiac rehab. He initially required 2 liters oxygen via [place] and was able to be weaned to room air. He was felt surgically stable for discharge to short term rehab at [facility]. Today he reports he is doing well. Minimal pain at surgical sites.  No fevers or chills, denies shortness of breath or chest pains.  Pt also with new diagnosis of diabetes- tolerating medications  without any noted side effects at this time. Pt tolerating rehab and making progression in hopes to return home   Review of Systems:  Review of Systems  Constitutional: Negative for fever, chills and weight loss.  Respiratory: Negative for cough and shortness of breath.   Cardiovascular: Negative for chest pain, palpitations and leg swelling.  Gastrointestinal: Negative for heartburn, abdominal pain, diarrhea and constipation.  Genitourinary: Negative for dysuria, urgency and frequency.  Musculoskeletal: Negative for myalgias and joint pain.  Skin: Negative.   Neurological: Negative for dizziness, tingling, weakness and headaches.  Psychiatric/Behavioral: Negative for depression. The patient does not have insomnia.      Past Medical History  Diagnosis Date  . Coronary artery disease   . MI (myocardial infarction) 04/23/2007    inferior wall  . Sleep apnea   . Morbid obesity 06/26/2012  . S/P CABG x 3 07/04/2012    LIMA to LAD, SVG to D1, SVG to PDA, EVH via right thigh   Past Surgical History  Procedure Laterality Date  . Coronary angioplasty with stent placement  04/23/2007    PCI and stenting of mid RCA - Dr Bary Castilla @ [facility]  . Coronary artery bypass graft N/A 07/04/2012    Procedure: CORONARY ARTERY BYPASS GRAFTING (CABG);  Surgeon: [name], MD;  Location: [facility] OR;  Service: Open Heart Surgery;  Laterality: N/A;  x3 using right greater saphenous vein and left internal mammary.   . Intraoperative transesophageal echocardiogram N/A 07/04/2012    Procedure: INTRAOPERATIVE TRANSESOPHAGEAL ECHOCARDIOGRAM;  Surgeon: [name], MD;  Location: [facility] OR;  Service: Open  Heart Surgery;  Laterality: N/A;   Social History:   reports that he has never smoked. He has never used smokeless tobacco. He reports that he does not drink alcohol or use illicit drugs.  Family History  Problem Relation Age of Onset  . Coronary artery disease Mother     Medications: Patient's Medications  New  Prescriptions   No medications on file  Previous Medications   ASPIRIN 81 MG CHEWABLE TABLET    Chew 324 mg by mouth once.   ATORVASTATIN (LIPITOR) 80 MG TABLET    Take 1 tablet (80 mg total) by mouth daily at 6 PM.   CLOPIDOGREL (PLAVIX) 75 MG TABLET    Take 1 tablet (75 mg total) by mouth daily with breakfast.   DIPHENHYDRAMINE (BENADRYL) 25 MG TABLET    Take 50 mg by mouth at bedtime as needed for itching or allergies.   GLUCOSE BLOOD (ACCU-CHEK ACTIVE STRIPS) TEST STRIP    Use as instructed   GLUCOSE MONITORING KIT (FREESTYLE) MONITORING KIT    1 each by Does not apply route as needed for other.   LIVING WELL WITH DIABETES BOOK MISC    1 each by Does not apply route once.   METFORMIN (GLUCOPHAGE) 1000 MG TABLET    Take 1 tablet (1,000 mg total) by mouth 2 (two) times daily with a meal.   METOPROLOL TARTRATE (LOPRESSOR) 25 MG TABLET    Take 0.5 tablets (12.5 mg total) by mouth 2 (two) times daily.   OXYCODONE (OXY IR/ROXICODONE) 5 MG IMMEDIATE RELEASE TABLET    Take one tablet by  Mouth every four hours as needed; Take two tablets by mouth every four hours as needed for pain  Modified Medications   No medications on file  Discontinued Medications   No medications on file     Physical Exam:  Filed Vitals:   07/12/12 1332  BP: 128/74  Pulse: 73  Temp: 97.3 F (36.3 C)  Resp: 20     Physical Exam  Nursing note and vitals reviewed. Constitutional: He is oriented to person, place, and time. He appears well-developed and well-nourished. No distress.  HENT:  Head: Normocephalic and atraumatic.  Mouth/Throat: Oropharynx is clear and moist. No oropharyngeal exudate.  Eyes: Conjunctivae and EOM are normal. Pupils are equal, round, and reactive to light.  Neck: Normal range of motion. Neck supple.  Cardiovascular: Normal rate, regular rhythm and normal heart sounds.   Pulmonary/Chest: Effort normal and breath sounds normal. No respiratory distress. He has no wheezes.  Abdominal:  Soft. Bowel sounds are normal. He exhibits no distension. There is no tenderness.  Musculoskeletal: Normal range of motion. He exhibits no edema and no tenderness.  Neurological: He is alert and oriented to person, place, and time.  Skin: Skin is warm and dry. He is not diaphoretic.  Healing incision to chest and leg- no signs of infection  Psychiatric: He has a normal mood and affect.     Labs reviewed: Basic Metabolic Panel:  Recent Labs  78/29/56 0414  07/04/12 2030 07/04/12 2031 07/05/12 0400 07/05/12 1655 07/06/12 0500  NA 136  < >  --  141 139  --  132*  K 4.0  < >  --  4.9 4.5  --  3.9  CL 103  --   --  108 107  --  96  CO2 27  --   --   --  24  --  27  GLUCOSE 258*  < >  --  143* 127*  --  196*  BUN 13  --   --  9 9  --  12  CREATININE 0.94  --  0.82 0.90 0.81 0.95 0.86  CALCIUM 9.1  --   --   --  8.0*  --  8.8  MG  --   --  2.7*  --  2.3 2.0  --   < > = values in this interval not displayed. Liver Function Tests:  Recent Labs  06/25/12 1057  AST 29  ALT 50  ALKPHOS 100  BILITOT 0.5  PROT 7.5  ALBUMIN 3.7   No results found for this basename: LIPASE, AMYLASE,  in the last 8760 hours No results found for this basename: AMMONIA,  in the last 8760 hours CBC:  Recent Labs  07/05/12 0400 07/05/12 1655 07/06/12 0500  WBC 9.0 12.4* 11.3*  HGB 11.6* 12.3* 11.7*  HCT 35.0* 37.1* 34.9*  MCV 89.1 89.4 88.1  PLT 135* 144* 120*   Cardiac Enzymes:  Recent Labs  06/25/12 1606 06/25/12 1911 06/26/12 0355  TROPONINI 0.88* 2.02* 10.01*   BNP: No components found with this basename: POCBNP,  CBG:  Recent Labs  07/09/12 2115 07/10/12 0554 07/10/12 1145  GLUCAP 185* 177* 146*    Radiological Exams- reviewed via epic  Assessment/Plan  1. CAD (coronary artery disease) 414.00     On plavix   2.   Diabetes mellitus type II, uncontrolled 250.02       to cont metformin- will cont to follow blood sugars   3.   S/P CABG x 3 V45.81     Stable- cont  therapy services. To cont lipitor, metoprolol, plavix    4.    Morbid obesity   Encouraged for slow increase in activity and diet modification   Rehab Status: good

## 2012-07-18 ENCOUNTER — Encounter: Payer: Self-pay | Admitting: Nurse Practitioner

## 2012-07-18 ENCOUNTER — Non-Acute Institutional Stay (SKILLED_NURSING_FACILITY): Payer: 59 | Admitting: Nurse Practitioner

## 2012-07-18 DIAGNOSIS — IMO0001 Reserved for inherently not codable concepts without codable children: Secondary | ICD-10-CM

## 2012-07-18 DIAGNOSIS — I2510 Atherosclerotic heart disease of native coronary artery without angina pectoris: Secondary | ICD-10-CM

## 2012-07-18 DIAGNOSIS — E1165 Type 2 diabetes mellitus with hyperglycemia: Secondary | ICD-10-CM

## 2012-07-18 DIAGNOSIS — E781 Pure hyperglyceridemia: Secondary | ICD-10-CM

## 2012-07-18 DIAGNOSIS — Z951 Presence of aortocoronary bypass graft: Secondary | ICD-10-CM

## 2012-07-18 MED ORDER — METOPROLOL TARTRATE 25 MG PO TABS: 12.5000 mg | ORAL_TABLET | Freq: Two times a day (BID) | ORAL | Status: DC

## 2012-07-18 MED ORDER — CLOPIDOGREL BISULFATE 75 MG PO TABS: 75.0000 mg | ORAL_TABLET | Freq: Every day | ORAL | Status: DC

## 2012-07-18 MED ORDER — METFORMIN HCL 1000 MG PO TABS: 1000.0000 mg | ORAL_TABLET | Freq: Two times a day (BID) | ORAL | Status: DC

## 2012-07-18 MED ORDER — ATORVASTATIN CALCIUM 80 MG PO TABS: 80.0000 mg | ORAL_TABLET | Freq: Every day | ORAL | Status: DC

## 2012-07-18 NOTE — Progress Notes (Signed)
Patient ID: Barry [name], male   DOB: 1955/04/11, 57 y.o.   MRN: [id number]  Nursing Home Location:  [facility]   Place of Service: SNF [phone])   Chief Complaint: evaluation for discharge   HPI:   57 year old male with significant heart history with minimal medical follow up was hospitalized from 06/25/2012 - 07/10/2012. He underwent a CABG x 3 on 07/04/2012 and He was felt surgically stable for discharge to short term rehab at [facility]. Today he reports he is doing well. Minimal pain at surgical sites. No fevers or chills, denies shortness of breath or chest pains.  Pt also with new diagnosis of diabetes- tolerating medications without any noted side effects at this time. Patient currently doing well with therapy, now stable to discharge home with home health. Will need close follow up as outpt and pt agrees he will be more complaints and cont to keep follow up appts.    Review of Systems:  Review of Systems  Constitutional: Negative for fever, chills and weight loss.  Respiratory: Negative for cough and shortness of breath.   Cardiovascular: Negative for chest pain, palpitations and leg swelling.  Gastrointestinal: Negative for heartburn, abdominal pain, diarrhea and constipation.  Genitourinary: Negative for dysuria, urgency and frequency.  Musculoskeletal: Negative for myalgias and joint pain.  Skin: Negative.   Neurological: Negative for dizziness, tingling, weakness and headaches.  Psychiatric/Behavioral: Negative for depression. The patient does not have insomnia.      Medications: Patient's Medications  New Prescriptions   No medications on file  Previous Medications   ASPIRIN 81 MG CHEWABLE TABLET    Chew 324 mg by mouth once.   ATORVASTATIN (LIPITOR) 80 MG TABLET    Take 1 tablet (80 mg total) by mouth daily at 6 PM.   CLOPIDOGREL (PLAVIX) 75 MG TABLET    Take 1 tablet (75 mg total) by mouth daily with breakfast.   DIPHENHYDRAMINE (BENADRYL) 25 MG TABLET    Take  50 mg by mouth at bedtime as needed for itching or allergies.   GLUCOSE BLOOD (ACCU-CHEK ACTIVE STRIPS) TEST STRIP    Use as instructed   GLUCOSE MONITORING KIT (FREESTYLE) MONITORING KIT    1 each by Does not apply route as needed for other.   LIVING WELL WITH DIABETES BOOK MISC    1 each by Does not apply route once.   METFORMIN (GLUCOPHAGE) 1000 MG TABLET    Take 1 tablet (1,000 mg total) by mouth 2 (two) times daily with a meal.   METOPROLOL TARTRATE (LOPRESSOR) 25 MG TABLET    Take 0.5 tablets (12.5 mg total) by mouth 2 (two) times daily.   OXYCODONE (OXY IR/ROXICODONE) 5 MG IMMEDIATE RELEASE TABLET    Take one tablet by  Mouth every four hours as needed; Take two tablets by mouth every four hours as needed for pain  Modified Medications   No medications on file  Discontinued Medications   No medications on file     Physical Exam:  Filed Vitals:   07/18/12 1421  BP: 121/80  Pulse: 67  Temp: 98.2 F (36.8 C)  Resp: 20    Physical Exam  Nursing note and vitals reviewed. Constitutional: He is oriented to person, place, and time and well-developed, well-nourished, and in no distress. No distress.  HENT:  Head: Normocephalic and atraumatic.  Right Ear: External ear normal.  Left Ear: External ear normal.  Mouth/Throat: Oropharynx is clear and moist. No oropharyngeal exudate.  Eyes:  Conjunctivae and EOM are normal. Pupils are equal, round, and reactive to light.  Neck: Normal range of motion. Neck supple.  Cardiovascular: Normal rate, regular rhythm and normal heart sounds.   Pulmonary/Chest: Effort normal and breath sounds normal. No respiratory distress.  Abdominal: Soft. Bowel sounds are normal. He exhibits no distension. There is no tenderness.  Musculoskeletal: Normal range of motion. He exhibits no edema and no tenderness.  Neurological: He is alert and oriented to person, place, and time.  Skin: Skin is warm and dry. He is not diaphoretic.  Sternal and leg incisions  healing well- no erythema or edema or drainage  Psychiatric: Affect normal.        Assessment/Plan  1. CAD (coronary artery disease) 414.00  Stable to cont on plavix  2. Diabetes mellitus type II, uncontrolled 250.02  Stable pt to cont metformin- and follow up as outpt 3. S/P CABG x 3 V45.81  Stable- to cont lipitor, metoprolol, plavix; cont outpt follow up with cardiology  4. Morbid obesity  Encouraged slow increase in activity and diet modification- will needed ongoing education and follow up 5. Hyperlipidemia  To cont statin pt is stable for discharge-will need PT/OT per home health. No DME needed. Rx sent to pharm via epic with the exception of pain medication which was not given due to pt not needing.  will need to follow up with PCP within 2 weeks.

## 2012-07-19 MED ORDER — FREESTYLE SYSTEM KIT: 1.0000 | PACK | Status: DC | PRN

## 2012-07-19 MED ORDER — METOPROLOL TARTRATE 25 MG PO TABS: 12.5000 mg | ORAL_TABLET | Freq: Two times a day (BID) | ORAL | Status: DC

## 2012-07-19 MED ORDER — ATORVASTATIN CALCIUM 80 MG PO TABS: 80.0000 mg | ORAL_TABLET | Freq: Every day | ORAL | Status: DC

## 2012-07-19 MED ORDER — CLOPIDOGREL BISULFATE 75 MG PO TABS: 75.0000 mg | ORAL_TABLET | Freq: Every day | ORAL | Status: DC

## 2012-07-19 MED ORDER — METFORMIN HCL 1000 MG PO TABS: 1000.0000 mg | ORAL_TABLET | Freq: Two times a day (BID) | ORAL | Status: DC

## 2012-07-19 MED ORDER — GLUCOSE BLOOD VI STRP: ORAL_STRIP | Status: DC

## 2012-07-20 DIAGNOSIS — I214 Non-ST elevation (NSTEMI) myocardial infarction: Secondary | ICD-10-CM

## 2012-07-20 DIAGNOSIS — I2510 Atherosclerotic heart disease of native coronary artery without angina pectoris: Secondary | ICD-10-CM

## 2012-07-20 DIAGNOSIS — Z5189 Encounter for other specified aftercare: Secondary | ICD-10-CM

## 2012-08-02 ENCOUNTER — Ambulatory Visit (INDEPENDENT_AMBULATORY_CARE_PROVIDER_SITE_OTHER): Payer: 59 | Admitting: Internal Medicine

## 2012-08-02 ENCOUNTER — Other Ambulatory Visit: Payer: Self-pay | Admitting: *Deleted

## 2012-08-02 ENCOUNTER — Encounter: Payer: Self-pay | Admitting: Internal Medicine

## 2012-08-02 VITALS — BP 146/88 | HR 81 | Temp 98.1°F | Resp 15 | Ht 66.5 in | Wt 260.2 lb

## 2012-08-02 DIAGNOSIS — IMO0001 Reserved for inherently not codable concepts without codable children: Secondary | ICD-10-CM

## 2012-08-02 DIAGNOSIS — I2510 Atherosclerotic heart disease of native coronary artery without angina pectoris: Secondary | ICD-10-CM

## 2012-08-02 DIAGNOSIS — E1165 Type 2 diabetes mellitus with hyperglycemia: Secondary | ICD-10-CM

## 2012-08-02 DIAGNOSIS — E781 Pure hyperglyceridemia: Secondary | ICD-10-CM

## 2012-08-02 DIAGNOSIS — G4730 Sleep apnea, unspecified: Secondary | ICD-10-CM

## 2012-08-02 MED ORDER — METOPROLOL TARTRATE 25 MG PO TABS: 12.5000 mg | ORAL_TABLET | Freq: Two times a day (BID) | ORAL | Status: DC

## 2012-08-02 MED ORDER — METFORMIN HCL 1000 MG PO TABS: 1000.0000 mg | ORAL_TABLET | Freq: Two times a day (BID) | ORAL | Status: DC

## 2012-08-02 MED ORDER — ATORVASTATIN CALCIUM 80 MG PO TABS: 80.0000 mg | ORAL_TABLET | Freq: Every day | ORAL | Status: DC

## 2012-08-02 MED ORDER — CLOPIDOGREL BISULFATE 75 MG PO TABS: 75.0000 mg | ORAL_TABLET | Freq: Every day | ORAL | Status: DC

## 2012-08-02 NOTE — Progress Notes (Signed)
Patient ID: Barry [name], male   DOB: 1955/09/03, 57 y.o.   MRN: [id number]  Chief Complaint  Patient presents with  . NP to Establish    Np to Establish   No Known Allergies  Code Status: full code   HPI:  57 y/o male patient here to establish care. He was in [facility] before and last saw his PCP Dr Marcell Barlow 5 years back. He has history of CAD with stent placed 4 years back and a recent CAD, hyperlipidemia. He recently had chest pain and went to the hospital on 06/25/12 and was there until 07/10/12 with NSTEMI and multivessel CAD. He was taken to the cath lab and was found to have severe three-vessel coronary artery disease with what appeared to be acute occlusion of the terminal portions of the distal right coronary artery beyond the bifurcation. He underwent PCI and stenting of the right coronary artery, but this procedure was complicated by acute coronary dissection of the proximal right coronary artery requiring multiple long overlapping drug-eluting stents. During the procedure, the patient was loaded with Brilinta. Post catheterization the patient developed ongoing bleeding from his right groin. Brilinta has subsequently stopped. Cardiothoracic surgical was consulted and he underwent CABG. He required iv diuresis, iv insulin drip and lopressor. He was newly diagnosed with DM with a1c 10.3. He was then working with inpatient cardiac rehab. He initially required 2 liters oxygen via [place] and was able to be weaned to room air. He was then sent to SNF for STR and was there until 07/18/12. He has morbid obesity and has OSA. He uses CPAP machine but mentions this is old now and he needs new one.  cbg this am was 170 and 257 in afternoon. Is taking metformin 1000 mg daily  He is having trouble sleeping at night time  Review of Systems  Constitutional: Negative for fever, chills and diaphoresis.  HENT: Negative for congestion and sore throat.   Eyes: Negative for blurred vision and double vision.   Prosthetic right eye  Respiratory: Negative for cough, sputum production, shortness of breath and wheezing.   Cardiovascular: Negative for chest pain, palpitations, orthopnea and PND.  Gastrointestinal: Negative for heartburn, nausea, vomiting, abdominal pain, diarrhea and constipation.  Genitourinary: Negative for dysuria.  Musculoskeletal: Positive for back pain. Negative for falls.  Skin: Negative for itching and rash.  Neurological: Negative for dizziness, seizures, loss of consciousness, weakness and headaches.  Psychiatric/Behavioral: Negative for depression, suicidal ideas and memory loss. The patient is nervous/anxious and has insomnia.     Past Medical History  Diagnosis Date  . Coronary artery disease   . MI (myocardial infarction) 04/23/2007    inferior wall  . Sleep apnea   . Morbid obesity 06/26/2012  . S/P CABG x 3 07/04/2012    LIMA to LAD, SVG to D1, SVG to PDA, EVH via right thigh  . Unspecified essential hypertension   . Type II or unspecified type diabetes mellitus without mention of complication, not stated as uncontrolled    Past Surgical History  Procedure Laterality Date  . Coronary angioplasty with stent placement  04/23/2007    PCI and stenting of mid RCA - Dr Bary Castilla @ [facility]  . Coronary artery bypass graft N/A 07/04/2012    Procedure: CORONARY ARTERY BYPASS GRAFTING (CABG);  Surgeon: [name], MD;  Location: [facility] OR;  Service: Open Heart Surgery;  Laterality: N/A;  x3 using right greater saphenous vein and left internal mammary.   . Intraoperative transesophageal  echocardiogram N/A 07/04/2012    Procedure: INTRAOPERATIVE TRANSESOPHAGEAL ECHOCARDIOGRAM;  Surgeon: [name], MD;  Location: [facility] OR;  Service: Open Heart Surgery;  Laterality: N/A;   Social History:   reports that he has never smoked. He has never used smokeless tobacco. He reports that he does not drink alcohol or use illicit drugs.  Family History  Problem Relation Age of Onset  .  Coronary artery disease Mother     Medications: Patient's Medications  New Prescriptions   No medications on file  Previous Medications   ASPIRIN 81 MG CHEWABLE TABLET    Chew 324 mg by mouth once.   ATORVASTATIN (LIPITOR) 80 MG TABLET    Take 1 tablet (80 mg total) by mouth daily at 6 PM.   CLOPIDOGREL (PLAVIX) 75 MG TABLET    Take 1 tablet (75 mg total) by mouth daily with breakfast.   GLUCOSE BLOOD (ACCU-CHEK ACTIVE STRIPS) TEST STRIP    Use as instructed   METFORMIN (GLUCOPHAGE) 1000 MG TABLET    Take 1 tablet (1,000 mg total) by mouth 2 (two) times daily with a meal.   METOPROLOL TARTRATE (LOPRESSOR) 25 MG TABLET    Take 0.5 tablets (12.5 mg total) by mouth 2 (two) times daily.  Modified Medications   No medications on file  Discontinued Medications   DIPHENHYDRAMINE (BENADRYL) 25 MG TABLET    Take 50 mg by mouth at bedtime as needed for itching or allergies.   GLUCOSE MONITORING KIT (FREESTYLE) MONITORING KIT    1 each by Does not apply route as needed for other.   LIVING WELL WITH DIABETES BOOK MISC    1 each by Does not apply route once.   OXYCODONE (OXY IR/ROXICODONE) 5 MG IMMEDIATE RELEASE TABLET    Take one tablet by  Mouth every four hours as needed; Take two tablets by mouth every four hours as needed for pain     Physical Exam: Filed Vitals:   08/02/12 1507  BP: 146/88  Pulse: 81  Temp: 98.1 F (36.7 C)  TempSrc: Oral  Resp: 15  Height: 5' 6.5" (1.689 m)  Weight: 260 lb 3.2 oz (118.026 kg)   gen- obese male in NAD HEENT- no pallor, no icterus, no LAD, MMM, no carotid bruits cvs- ns 1,s2, rrr, sternal scar healing well, graft site skin healing well respi- CTAB abdo- bs+, soft, non tender Ext- able to move all 4, no edema Neuro- aao x3, no focal defict   Labs reviewed: Basic Metabolic Panel:  Recent Labs  16/10/96 0414  07/04/12 2030 07/04/12 2031 07/05/12 0400 07/05/12 1655 07/06/12 0500  NA 136  < >  --  141 139  --  132*  K 4.0  < >  --  4.9 4.5   --  3.9  CL 103  --   --  108 107  --  96  CO2 27  --   --   --  24  --  27  GLUCOSE 258*  < >  --  143* 127*  --  196*  BUN 13  --   --  9 9  --  12  CREATININE 0.94  --  0.82 0.90 0.81 0.95 0.86  CALCIUM 9.1  --   --   --  8.0*  --  8.8  MG  --   --  2.7*  --  2.3 2.0  --   < > = values in this interval not displayed. Liver Function Tests:  Recent  Labs  06/25/12 1057  AST 29  ALT 50  ALKPHOS 100  BILITOT 0.5  PROT 7.5  ALBUMIN 3.7   CBC:  Recent Labs  07/05/12 0400 07/05/12 1655 07/06/12 0500  WBC 9.0 12.4* 11.3*  HGB 11.6* 12.3* 11.7*  HCT 35.0* 37.1* 34.9*  MCV 89.1 89.4 88.1  PLT 135* 144* 120*   Cardiac Enzymes:  Recent Labs  06/25/12 1606 06/25/12 1911 06/26/12 0355  TROPONINI 0.88* 2.02* 10.01*   Lipid Panel     Component Value Date/Time   CHOL 165 06/26/2012 0355   TRIG 247* 06/26/2012 0355   HDL 30* 06/26/2012 0355   CHOLHDL 5.5 06/26/2012 0355   VLDL 49* 06/26/2012 0355   LDLCALC 86 06/26/2012 0355    Radiological Exams: 07/06/2012   *RADIOLOGY REPORT*  Clinical Data: Atelectasis.Postoperative day #2 following CABG.  CHEST - 2 VIEW  Comparison: 07/05/2012.  Findings: Cardiomegaly.  CABG.  Epicardial pacing leads remain present.  Mild basilar atelectasis.  Tiny bilateral pleural effusions.  Left upper extremity PICC is present with the tip at the cavoatrial junction. Monitoring leads are projected over the chest.  No airspace consolidation.  Subsegmental atelectasis or scarring in the right midlung.  Retrocardiac opacity is present extending inferior to the hemidiaphragm and overlying the gastric air bubble, likely representing atelectasis. No pneumothorax.  The right IJ vascular sheath and Swan-Ganz catheter as well as the mediastinal drain and left thoracostomy tube have been removed.  IMPRESSION: Tiny bilateral pleural effusions, scattered areas of subsegmental atelectasis and cardiomegaly.  Stable left upper extremity PICC with removal of other support  apparatus.   Original Report Authenticated By: Andreas Newport, M.D.   Procedure (s):   Cardiac Catheterization done by Dr. Allyson Sabal on 06/25/2012: 1. Left main; normal   2. LAD; 95-99% stenosis at the bifurcation of the first large side branch probably representing "in-stent restenosis with a proximal LAD stent.   3. Left circumflex; nondominant and normal.   4. Right coronary artery; dominant 75-80% segmental proximal, 80% mid and a total posterolateral branch representing the "culprit vessel"   5.LIMA was subselectively visualized a widely patent. It was suitable for use during cord artery bypass grafting if necessary   6. Left ventriculography; RAO left ventriculogram was performed using   25 mL of Visipaque dye at 12 mL/second. The overall LVEF estimated   50-55 % With wall motion abnormalities notable for mild inferobasal hypokinesia   7. Abdominal aortography: Renal arteries are widely patent. The infrarenal abdominal aorta and iliac bifurcation were free of significant atherosclerotic changes Successful PCI and stenting of the dominant RCA complicated by spiral dissection with 2 overlapping drug-eluting stents proximally and mid and a bare-metal stent distal.  Coronary Artery Bypass Grafting x 3  Left Internal Mammary Artery to Distal Left Anterior Descending Coronary Artery   Saphenous Vein Graft to Posterior Descending Coronary Artery   Sapheonous Vein Graft to Diagonal Branch Coronary Artery   Endoscopic Vein Harvest from Right Thigh by Dr. Cornelius Moras on 07/04/2012  duplex carotid US showed no significant internal stenosis bilaterally. ABI's were 1.34 on the right and 1.25.  Assessment/Plan  Diabetes mellitus- a1c 10.1. cbg this am was 170 and in the afternoon was 257. Continue metformin 1000 mg bid for now and monitor cbg. Check urine microalbumin. Continue statin and ASA. Not on ACEI  CAD- s/p CABG, continue statin, metoprolol, asa and plavix for now  Morbid obesity- has co-morbidities  with CAD, HL and DM type 2. Will need to exercise, lose  weight and watch his dietary intake  OSA- his CPAP is giving him trouble. Called [facility] and he will need another sleep study prior to getting a new cpap machine. Will schedule this appointment  Hyperlipidemia- continue lipitor 80 mg daily and monitor for now  Insomnia- will have him on trazodone 25 mg daily at bedtime as needed for sleep  Labs/tests ordered- cmp, sleep study

## 2012-08-07 ENCOUNTER — Encounter: Payer: Self-pay | Admitting: Thoracic Surgery (Cardiothoracic Vascular Surgery)

## 2012-08-07 ENCOUNTER — Ambulatory Visit
Admission: RE | Admit: 2012-08-07 | Discharge: 2012-08-07 | Disposition: A | Discharge status: Home / Self Care | Payer: 59 | Source: Ambulatory Visit | Attending: Thoracic Surgery (Cardiothoracic Vascular Surgery) | Admitting: Thoracic Surgery (Cardiothoracic Vascular Surgery)

## 2012-08-07 ENCOUNTER — Ambulatory Visit (INDEPENDENT_AMBULATORY_CARE_PROVIDER_SITE_OTHER): Payer: Self-pay | Admitting: Thoracic Surgery (Cardiothoracic Vascular Surgery)

## 2012-08-07 VITALS — BP 137/75 | HR 100 | Resp 20 | Ht 66.5 in | Wt 260.0 lb

## 2012-08-07 DIAGNOSIS — I2510 Atherosclerotic heart disease of native coronary artery without angina pectoris: Secondary | ICD-10-CM

## 2012-08-07 DIAGNOSIS — Z951 Presence of aortocoronary bypass graft: Secondary | ICD-10-CM

## 2012-08-07 NOTE — Progress Notes (Signed)
[address]       [name] [address]             [id number]     CARDIOTHORACIC SURGERY OFFICE NOTE  Referring Provider is Laurey Morale, MD PCP is Oneal Grout, MD   HPI:  Patient returns for routine followup status post coronary artery bypass grafting x3 on 07/04/2012.  He originally presented with an acute non-ST segment elevation myocardial infarction and underwent urgent cardiac catheterization by Dr. Allyson Sabal. He had three-vessel coronary artery disease with what appeared to be acute occlusion of the distal right coronary artery. He underwent PCI and stenting of the right coronary artery, but this procedure was complicated by acute coronary dissection requiring the placement of multiple long overlapping drug eluding stents.  He recovered from this and subsequently underwent surgical revascularization later during that hospitalization on 07/04/2012.  His postoperative recovery was uncomplicated. Because the patient lives alone he was initially discharged to a skilled nursing facility. He has continued to recover uneventfully and he is now back living in his apartment alone. He reports mild soreness across his chest which continues to gradually improve. He has not had any exertional chest pain or chest tightness worrisome for angina. His exercise tolerance is slowly improving.  He has long history of morbid obesity and obstructive sleep apnea, and he uses a CPAP machine at home. He recently was seen in followup by his primary care physician who is adjusting his diabetes medications and having intestine for a new CPAP machine. He has developed a rash on his face from his existing CPAP machine. His blood sugar control is suboptimal but having the right direction. Overall he is pleased with his progress. He is worried about possibly losing his job.   Current Outpatient Prescriptions  Medication Sig Dispense Refill  . aspirin 81 MG chewable tablet Chew 324 mg by mouth  once.      Marland Kitchen atorvastatin (LIPITOR) 80 MG tablet Take 1 tablet (80 mg total) by mouth daily at 6 PM.  30 tablet  3  . clopidogrel (PLAVIX) 75 MG tablet Take 1 tablet (75 mg total) by mouth daily with breakfast.  30 tablet  3  . glucose blood (ACCU-CHEK ACTIVE STRIPS) test strip Use as instructed  100 each  12  . metFORMIN (GLUCOPHAGE) 1000 MG tablet Take 1 tablet (1,000 mg total) by mouth 2 (two) times daily with a meal.  60 tablet  3  . metoprolol tartrate (LOPRESSOR) 25 MG tablet Take 0.5 tablets (12.5 mg total) by mouth 2 (two) times daily.  30 tablet  3   No current facility-administered medications for this visit.      Physical Exam:   BP 137/75  Pulse 100  Resp 20  Ht 5' 6.5" (1.689 m)  Wt 117.935 kg (260 lb)  BMI 41.34 kg/m2  SpO2 96%  General:  Well-appearing with exception of erythematous rash on his face reportedly related to his CPAP machine  Chest:   Clear to auscultation  CV:   Regular rate and rhythm  Incisions:  Clean and dry and healing nicely, sternum is stable  Abdomen:  Soft and nontender  Extremities:  Warm and well-perfused with no lower extremity edema  Diagnostic Tests:  *RADIOLOGY REPORT*  Clinical Data: Post CABG (06/2012), now shortness of breath  CHEST - 2 VIEW  Comparison: 07/06/2012; six is 11/2012; 07/04/2012  Findings:  Grossly unchanged borderline enlarged cardiac silhouette and  mediastinal contours post median sternotomy  and CABG. Post  coronary artery stenting. Interval removal of left upper extremity  approach PICC line. Interval development of a small partially  loculated left-sided pleural effusion with associated worsening  left basilar heterogeneous opacities. Veiling opacity overlying  left mid lung is favored to represent a small area of loculated  pleural effusion. The right hemithorax is unchanged. No definite  evidence of edema. No pneumothorax. Unchanged bones.  IMPRESSION:  Interval development of small partially loculated  left-sided  effusion with associated left mid and lower lung heterogeneous  opacities, atelectasis versus infiltrate.  Original Report Authenticated By: Tacey Ruiz, MD    Impression:  The patient is progressing well following coronary artery bypass grafting. He was discharged on Plavix because of the multiple overlapping drug eluding stents placed at the time of his cardiac catheterization. The patient is progressing well and would benefit from the cardiac rehabilitation program. Chest x-ray reveals small left pleural effusion.  Plan:  I've encouraged patient to continue to increase his physical activity as tolerated but I have reminded him to avoid any sort of heavy lifting or strenuous use of his arms or shoulders for least another 2-3 months. I think by September 1 he could potentially return to work with some lifting restrictions. By October 1 he could probably be released to unrestricted physical activity. We have not made any changes in his current medications today. He has been reminded to schedule an appointment with his cardiologist. I've encouraged him to get started the cardiac rehabilitation program. I think it is reasonable for him to resume driving an automobile. We will have him return in 6 weeks with a followup chest x-ray to make sure that his pleural effusion has resolved.   [place]ry [name]. Barry Moras, MD 08/07/2012 4:33 PM

## 2012-08-07 NOTE — Patient Instructions (Addendum)
Call to schedule follow up appointment with your cardiologist as soon as practical.  The patient should continue to avoid any heavy lifting or strenuous use of arms or shoulders for at least a total of three months from the time of surgery.  The patient may return to driving an automobile as long as they are no longer requiring oral narcotic pain relievers during the daytime.  It would be wise to start driving only short distances during the daylight and gradually increase from there as they feel comfortable.  The patient is encouraged to enroll and participate in the outpatient cardiac rehab program beginning as soon as practical.  The patient is reminded to make every effort to keep their diabetes under very tight control.  They should follow up closely with their primary care physician or endocrinologist and strive to keep their hemoglobin A1c levels as low as possible, preferably near or below 6.0

## 2012-08-17 ENCOUNTER — Ambulatory Visit (INDEPENDENT_AMBULATORY_CARE_PROVIDER_SITE_OTHER): Payer: 59 | Admitting: Cardiovascular Disease

## 2012-08-17 ENCOUNTER — Encounter: Payer: Self-pay | Admitting: Cardiovascular Disease

## 2012-08-17 VITALS — BP 118/70 | HR 89 | Ht 65.0 in | Wt 261.0 lb

## 2012-08-17 DIAGNOSIS — E781 Pure hyperglyceridemia: Secondary | ICD-10-CM

## 2012-08-17 DIAGNOSIS — I2510 Atherosclerotic heart disease of native coronary artery without angina pectoris: Secondary | ICD-10-CM

## 2012-08-17 DIAGNOSIS — Z79899 Other long term (current) drug therapy: Secondary | ICD-10-CM

## 2012-08-17 DIAGNOSIS — I214 Non-ST elevation (NSTEMI) myocardial infarction: Secondary | ICD-10-CM

## 2012-08-17 NOTE — Progress Notes (Signed)
08/17/2012 [place] [name]   1955-09-05  [id number]  Primary Physician Oneal Grout, MD Primary Cardiologist: [name] MD Roseanne Reno   HPI:  57 yo with history of CAD s/p MI about 4 years ago and PCI in [place] presents to ER today with NSTEMI. Patient has been having exertional dyspnea but not chest pain for about a month. The shortness of breath comes with walking more than just a short distance. Last night, he developed a severe headache. The headache resolved after Ibuprofen but he later woke up with substernal chest pressure. This resolved in about 1 hour (he iced his chest) and he went back to sleep. This morning, while making breakfast, the chest pain returned. It was severe 10/10 like an elephant on his chest. He went to the ER and had morphine and NTG sublingual. He has just been started on NTG gtt at 5. CP is down to 4/10 (much impoved but still there). Initial TnI was 0.47. Initial ECG showed slight ST elevation in II but repeat ECG showed that this had basically resolved. Because of a slightly increased troponin and mildly increased chest pain with increasing doses of IV nitroglycerin Dr. Carolanne Grumbling evaluated the patient and felt that the patient should come to the Cath Lab for intervention. He ultimately underwent coronary artery bypass grafting x3 by Dr. Ashley Mariner on 07/04/12 after his DA PT washed out. He was placed on Integrilin in the interim. He did well on his has recuperated nicely. He plans on participating in cardiac rehabilitation.    Current Outpatient Prescriptions  Medication Sig Dispense Refill  . aspirin 81 MG chewable tablet Chew 324 mg by mouth once.      Marland Kitchen atorvastatin (LIPITOR) 80 MG tablet Take 1 tablet (80 mg total) by mouth daily at 6 PM.  30 tablet  3  . clopidogrel (PLAVIX) 75 MG tablet Take 1 tablet (75 mg total) by mouth daily with breakfast.  30 tablet  3  . glucose blood (ACCU-CHEK ACTIVE STRIPS) test strip Use as instructed  100 each  12   . metFORMIN (GLUCOPHAGE) 1000 MG tablet Take 1 tablet (1,000 mg total) by mouth 2 (two) times daily with a meal.  60 tablet  3  . metoprolol tartrate (LOPRESSOR) 25 MG tablet Take 0.5 tablets (12.5 mg total) by mouth 2 (two) times daily.  30 tablet  3   No current facility-administered medications for this visit.    No Known Allergies  History   Social History  . Marital Status: Married    Spouse Name: N/A    Number of Children: N/A  . Years of Education: N/A   Occupational History  . Not on file.   Social History Main Topics  . Smoking status: Never Smoker   . Smokeless tobacco: Never Used  . Alcohol Use: No  . Drug Use: No  . Sexually Active: Not on file   Other Topics Concern  . Not on file   Social History Narrative  . No narrative on file     Review of Systems: General: negative for chills, fever, night sweats or weight changes.  Cardiovascular: negative for chest pain, dyspnea on exertion, edema, orthopnea, palpitations, paroxysmal nocturnal dyspnea or shortness of breath Dermatological: negative for rash Respiratory: negative for cough or wheezing Urologic: negative for hematuria Abdominal: negative for nausea, vomiting, diarrhea, bright red blood per rectum, melena, or hematemesis Neurologic: negative for visual changes, syncope, or dizziness All other systems reviewed and are otherwise  negative except as noted above.    Blood pressure 118/70, pulse 89, height 5\' 5"  (1.651 m), weight 261 lb (118.389 kg).  General appearance: alert and no distress Neck: no adenopathy, no carotid bruit, no JVD, supple, symmetrical, trachea midline and thyroid not enlarged, symmetric, no tenderness/mass/nodules Lungs: clear to auscultation bilaterally Heart: regular rate and rhythm, S1, S2 normal, no murmur, click, rub or gallop Extremities: extremities normal, atraumatic, no cyanosis or edema  EKG normal sinus rhythm at 89 with small inferior Q waves  ASSESSMENT AND PLAN:    NSTEMI (non-ST elevated myocardial infarction) Barry [name] underwent emergency PCI stenting of his RCA  By myself 06/25/12 the setting of a inferior ST segment elevation myocardial infarction. He had two-vessel disease with prior stenting in 2002. His procedure was complicated by spiral dissection of his dominant RCA requiring multiple stents and ultimately he stabilized and after his DA PT washed out he underwent coronary artery bypass grafting x3 by Dr. Ashley Mariner on 07/04/12. He has done well since and denies chest pain or shortness of breath.  Hypertriglyceridemia On statin therapy. His PCP will follow his blood work as an outpatient      [name] MD [place], [facility] 08/17/2012 8:50 AM

## 2012-08-17 NOTE — Assessment & Plan Note (Addendum)
Barry [name] underwent emergency PCI stenting of his RCA  By myself 06/25/12 the setting of a inferior ST segment elevation myocardial infarction. He had two-vessel disease with prior stenting in 2002. His procedure was complicated by spiral dissection of his dominant RCA requiring multiple stents and ultimately he stabilized and after his DA PT washed out he underwent coronary artery bypass grafting x3 by Dr. Ashley Mariner on 07/04/12. He has done well since and denies chest pain or shortness of breath.

## 2012-08-17 NOTE — Assessment & Plan Note (Signed)
On statin therapy. His PCP will follow his blood work as an outpatient

## 2012-08-17 NOTE — Patient Instructions (Addendum)
Dr Allyson Sabal has cleared you to start Cardiac rehab  Dr Allyson Sabal will see you back in 3 months

## 2012-09-01 ENCOUNTER — Other Ambulatory Visit: Payer: Self-pay | Admitting: *Deleted

## 2012-09-01 ENCOUNTER — Other Ambulatory Visit: Payer: 59

## 2012-09-01 DIAGNOSIS — G4730 Sleep apnea, unspecified: Secondary | ICD-10-CM

## 2012-09-01 DIAGNOSIS — E781 Pure hyperglyceridemia: Secondary | ICD-10-CM

## 2012-09-01 DIAGNOSIS — I2510 Atherosclerotic heart disease of native coronary artery without angina pectoris: Secondary | ICD-10-CM

## 2012-09-01 DIAGNOSIS — Z79899 Other long term (current) drug therapy: Secondary | ICD-10-CM

## 2012-09-01 NOTE — Addendum Note (Signed)
Addended by: Melina Modena F on: 09/01/2012 09:36 AM   Modules accepted: Orders

## 2012-09-04 ENCOUNTER — Ambulatory Visit (HOSPITAL_BASED_OUTPATIENT_CLINIC_OR_DEPARTMENT_OTHER): Payer: 59 | Attending: Internal Medicine | Admitting: Radiology

## 2012-09-04 VITALS — Ht 65.0 in | Wt 260.0 lb

## 2012-09-04 DIAGNOSIS — G4737 Central sleep apnea in conditions classified elsewhere: Secondary | ICD-10-CM | POA: Insufficient documentation

## 2012-09-04 DIAGNOSIS — G4733 Obstructive sleep apnea (adult) (pediatric): Secondary | ICD-10-CM | POA: Insufficient documentation

## 2012-09-05 ENCOUNTER — Encounter: Payer: Self-pay | Admitting: Gastroenterology

## 2012-09-05 ENCOUNTER — Ambulatory Visit (INDEPENDENT_AMBULATORY_CARE_PROVIDER_SITE_OTHER): Payer: 59 | Admitting: Internal Medicine

## 2012-09-05 ENCOUNTER — Encounter: Payer: Self-pay | Admitting: Internal Medicine

## 2012-09-05 VITALS — BP 138/88 | HR 75 | Temp 97.1°F | Resp 14 | Ht 65.0 in | Wt 263.2 lb

## 2012-09-05 DIAGNOSIS — Z23 Encounter for immunization: Secondary | ICD-10-CM

## 2012-09-05 DIAGNOSIS — Z0000 Encounter for general adult medical examination without abnormal findings: Secondary | ICD-10-CM

## 2012-09-05 DIAGNOSIS — I2510 Atherosclerotic heart disease of native coronary artery without angina pectoris: Secondary | ICD-10-CM

## 2012-09-05 DIAGNOSIS — F418 Other specified anxiety disorders: Secondary | ICD-10-CM | POA: Insufficient documentation

## 2012-09-05 DIAGNOSIS — F341 Dysthymic disorder: Secondary | ICD-10-CM

## 2012-09-05 DIAGNOSIS — IMO0001 Reserved for inherently not codable concepts without codable children: Secondary | ICD-10-CM

## 2012-09-05 DIAGNOSIS — E1165 Type 2 diabetes mellitus with hyperglycemia: Secondary | ICD-10-CM

## 2012-09-05 DIAGNOSIS — G4730 Sleep apnea, unspecified: Secondary | ICD-10-CM

## 2012-09-05 DIAGNOSIS — E781 Pure hyperglyceridemia: Secondary | ICD-10-CM

## 2012-09-05 MED ORDER — SERTRALINE HCL 50 MG PO TABS: 50.0000 mg | ORAL_TABLET | Freq: Every day | ORAL | Status: DC

## 2012-09-05 NOTE — Progress Notes (Signed)
Patient ID: Barry [name], male   DOB: 12/06/1955, 57 y.o.   MRN: [id number]  Chief Complaint  Patient presents with  . Annual Exam    Complains of Insomnia. Did a sleep study last night to get new CPAP    No Known Allergies  HPI 57 y/o male patient is here for annual visit. He has history of CAD with stent placed 4 years back and a recent CAD s/p CABG, hyperlipidemia, DM, morbid obesity and OSA. cbg this am was 230. Has not had colonoscopy. Not a smoker in past Has not taken influenza vaccine, pneumococcal vaccine 3 lbs weight gain since last visit Has been walking for exercise for 20-30 min daily His job issue is getting him stressed recently He had sleep study yesterday for CPAP machine  Review of Systems  Constitutional: Negative for fever, chills and diaphoresis.  HENT: Negative for congestion and sore throat.   Eyes: Negative for blurred vision and double vision.        Prosthetic right eye  Respiratory: Negative for cough, sputum production, shortness of breath and wheezing.   Cardiovascular: Negative for chest pain, palpitations, orthopnea and PND.  Gastrointestinal: Negative for heartburn, nausea, vomiting, abdominal pain, diarrhea and constipation.  Genitourinary: Negative for dysuria.  Musculoskeletal: Positive for back pain. Negative for falls.  Skin: Negative for itching and rash.  Neurological: Negative for dizziness, seizures, loss of consciousness, weakness and headaches.  Psychiatric/Behavioral: Negative for depression, suicidal ideas and memory loss. The patient is nervous/anxious and has insomnia.     Past Medical History  Diagnosis Date  . Coronary artery disease   . MI (myocardial infarction) 04/23/2007    inferior wall  . Sleep apnea   . Morbid obesity 06/26/2012  . S/P CABG x 3 07/04/2012    LIMA to LAD, SVG to D1, SVG to PDA, EVH via right thigh  . Unspecified essential hypertension   . Type II or unspecified type diabetes mellitus without mention of  complication, not stated as uncontrolled    Past Surgical History  Procedure Laterality Date  . Coronary angioplasty with stent placement  04/23/2007    PCI and stenting of mid RCA - Dr Bary Castilla @ [facility]  . Coronary artery bypass graft N/A 07/04/2012    Procedure: CORONARY ARTERY BYPASS GRAFTING (CABG);  Surgeon: [name], MD;  Location: [facility] OR;  Service: Open Heart Surgery;  Laterality: N/A;  x3 using right greater saphenous vein and left internal mammary.   . Intraoperative transesophageal echocardiogram N/A 07/04/2012    Procedure: INTRAOPERATIVE TRANSESOPHAGEAL ECHOCARDIOGRAM;  Surgeon: [name], MD;  Location[facility] OR;  Service: Open Heart Surgery;  Laterality: N/A;    Family History  Problem Relation Age of Onset  . Cancer Mother   . Diabetes Sister   . Diabetes Brother    History   Social History  . Marital Status: Married    Spouse Name: N/A    Number of Children: N/A  . Years of Education: N/A   Occupational History  . Not on file.   Social History Main Topics  . Smoking status: Never Smoker   . Smokeless tobacco: Never Used  . Alcohol Use: No  . Drug Use: No  . Sexual Activity: Not on file   Other Topics Concern  . Not on file   Social History Narrative  . No narrative on file    BP 138/88  Pulse 75  Temp(Src) 97.1 F (36.2 C) (Oral)  Resp 14  Ht  5\' 5"  (1.651 m)  Wt 263 lb 3.2 oz (119.387 kg)  BMI 43.8 kg/m2  gen- obese male in NAD HEENT- no pallor, no icterus, no LAD, MMM, no carotid bruits left PERRLA, right prosthetic eye cvs- ns 1,s2, rrr, sternal scar healing well, graft site skin healed well respi- CTAB, no wheeze or rhonchi, no crackles abdo- bs+, soft, non tender, distended Ext- able to move all 4, no edema, distal pulses palpable Neuro- aao x3, no focal deficit, normal reflexes Psych- low mood, not tearful, normal thought process Skin- erythema on the skin on the face around the mask site from yesterday   Labs-  09/01/12 cmp glu  214, bun 9, cr 0.98, na 139, k 4.6, cl 101, co2 22, ca 9.7, t.chol 120, tg 110, hdl 31, ldl 67   Assessment/Plan  Diabetes mellitus- a1c 10.1. Continue metformin 1000 mg bid for now and monitor cbg. Check urine microalbumin. Continue statin and ASA. Not on ACEI to recheck his a1c prior to next visit and if remains elevated, will consider adding second oral hypoglycemics. Goal a1c<7  CAD- s/p CABG, continue statin, metoprolol, asa and plavix for now  Depression and anxiety- will start patient on sertraline 50 mg daily for a week and then increase to 100 mg daily. Reassess in 4-6 weeks  General exam- will provide pneumococcal vaccine today. Will provide flu vaccine next visit. Will provide gi referral for screening colonoscopy. Reviewed lab results.  Morbid obesity- has co-morbidities with CAD, HL and DM type 2. Will need to exercise, lose weight and watch his dietary intake  OSA- had his sleep study yesterday. Will follow on cpap instruction  Hyperlipidemia- continue lipitor 80 mg daily and monitor for now. ldl is at goal

## 2012-09-06 LAB — MICROALBUMIN / CREATININE URINE RATIO
Creatinine, Ur: 134.1 mg/dL (ref 22.0–328.0)
MICROALB/CREAT RATIO: 156.5 mg/g creat — ABNORMAL HIGH (ref 0.0–30.0)
Microalbumin, Urine: 209.8 ug/mL — ABNORMAL HIGH (ref 0.0–17.0)

## 2012-09-07 DIAGNOSIS — G4737 Central sleep apnea in conditions classified elsewhere: Secondary | ICD-10-CM

## 2012-09-07 DIAGNOSIS — G4733 Obstructive sleep apnea (adult) (pediatric): Secondary | ICD-10-CM

## 2012-09-08 NOTE — Procedures (Signed)
NAME:  Serratore, Pantelis                    ACCOUNT NO.:  [id number]  MEDICAL RECORD NO.:  [id number]          PATIENT TYPE:  OUT  LOCATION:  SLEEP CENTER                 FACILITY:  [facility]  PHYSICIAN:  Fairy Ashlock D. Maple Hudson, MD, FCCP, FACPDATE OF BIRTH:  1955/12/29  DATE OF STUDY:  09/04/2012                           NOCTURNAL POLYSOMNOGRAM  REFERRING PHYSICIAN:  Oneal Grout, MD  INDICATION FOR STUDY:  Insomnia with sleep apnea.  EPWORTH SLEEPINESS SCORE:  6/24.  BMI 43.3, weight 260 pounds, height 65 inches, neck 18 inches.  Home medications are charted for review.  SLEEP ARCHITECTURE:  Total sleep time 273.5 minutes with sleep efficiency 66.9%.  Stage I was 9%, stage II was 78.1%, stage III absent, REM 13% of total sleep time.  Sleep latency 33.5 minutes, REM latency 305.5 minutes, awake after sleep onset 87.5 minutes, arousal index 87.3. Bedtime medication:  Atorvastatin, metformin, metoprolol.  RESPIRATORY DATA:  Apnea-hypopnea index (AHI) 77.4 per hour.  A total of 353 events were scored including 49 obstructive apneas, 80 central apneas, 6 mixed apneas, 218 hypopneas.  Events were not positional.  REM AHI 106.5 per hour.  This is a diagnostic NPSG protocol as ordered, and CPAP was not done.  OXYGEN DATA:  Moderate snoring with oxygen desaturation to a nadir of 76% and mean oxygen saturation through the study of 93.2% on room air.  CARDIAC DATA:  Normal sinus rhythm.  MOVEMENT-PARASOMNIA:  A total of 21 limb jerks were counted of which only 3 were associated with arousals or awakenings for periodic limb movement with arousal index of 0.7 per hour.  Bathroom x2.  IMPRESSIONS/RECOMMENDATIONS: 1. Severe obstructive and central sleep apnea/hypopnea syndrome, AHI     77.4 per hour with non-positional events.  REM AHI 106.5 per hour.     Moderate snoring with oxygen desaturation to a nadir of 76% and     mean oxygen saturation through the study of 93.2% on room air. 2. This study was  ordered as a diagnostic NPSG protocol.  Consider     returning for a dedicated CPAP titration     study if appropriate for this patient.  Many or all of these events     scored as central apneas may resolve with CPAP control.     Shacoria Latif D. Maple Hudson, MD, [facility], FACP Diplomate, American Board of Sleep Medicine    CDY/MEDQ  D:  09/07/2012 19:22:40  T:  09/08/2012 03:52:46  Job:  [id number]

## 2012-10-10 ENCOUNTER — Ambulatory Visit (INDEPENDENT_AMBULATORY_CARE_PROVIDER_SITE_OTHER): Payer: 59 | Admitting: Internal Medicine

## 2012-10-10 ENCOUNTER — Encounter: Payer: Self-pay | Admitting: Internal Medicine

## 2012-10-10 VITALS — BP 130/74 | HR 72 | Temp 99.0°F | Ht 65.5 in | Wt 256.0 lb

## 2012-10-10 DIAGNOSIS — F418 Other specified anxiety disorders: Secondary | ICD-10-CM

## 2012-10-10 DIAGNOSIS — L600 Ingrowing nail: Secondary | ICD-10-CM

## 2012-10-10 DIAGNOSIS — G4730 Sleep apnea, unspecified: Secondary | ICD-10-CM

## 2012-10-10 DIAGNOSIS — F341 Dysthymic disorder: Secondary | ICD-10-CM

## 2012-10-10 DIAGNOSIS — E781 Pure hyperglyceridemia: Secondary | ICD-10-CM

## 2012-10-10 DIAGNOSIS — IMO0002 Reserved for concepts with insufficient information to code with codable children: Secondary | ICD-10-CM

## 2012-10-10 DIAGNOSIS — E1165 Type 2 diabetes mellitus with hyperglycemia: Secondary | ICD-10-CM

## 2012-10-10 DIAGNOSIS — I2510 Atherosclerotic heart disease of native coronary artery without angina pectoris: Secondary | ICD-10-CM

## 2012-10-10 DIAGNOSIS — IMO0001 Reserved for inherently not codable concepts without codable children: Secondary | ICD-10-CM

## 2012-10-10 DIAGNOSIS — Z23 Encounter for immunization: Secondary | ICD-10-CM

## 2012-10-10 MED ORDER — LISINOPRIL 5 MG PO TABS: 5.0000 mg | ORAL_TABLET | Freq: Every day | ORAL | Status: DC

## 2012-10-10 NOTE — Progress Notes (Signed)
Patient ID: Barry [name], male   DOB: 1955-07-24, 57 y.o.   MRN: [id number]  Chief Complaint  Patient presents with  . Medical Managment of Chronic Issues    5 week follow-up, no specific concerns. Patient is improving overall     No Known Allergies  HPI 57 y/o male patient is here for his follow up. He has lost 7 lbs since last visit. He is avoiding soda, doing calorie count. He is eating more fruits. He has started working from 09/25/12. He has not heard back from sleep centre. He has been walking for exercise cbg at home between 190-205. Denies any hypoglycemic episodes His mood is stable on zoloft 50 mg daily. Tried taking 2 of this --total 100 mg daily but made him sleepy and tired Of note- he has history of CAD with stent placed 4 years back and a recent CAD s/p CABG, hyperlipidemia, DM, morbid obesity and OSA.  Reviewed his sleep study which shows severe OSA and central sleep apnea   Review of Systems   Constitutional: Negative for fever, chills and diaphoresis.   HENT: Negative for congestion and sore throat.    Eyes: Negative for blurred vision and double vision.        Prosthetic right eye   Respiratory: Negative for cough, sputum production, shortness of breath and wheezing.    Cardiovascular: Negative for chest pain, palpitations, orthopnea and PND.   Gastrointestinal: Negative for heartburn, nausea, vomiting, abdominal pain, diarrhea and constipation.   Genitourinary: Negative for dysuria.   Musculoskeletal: back pain is under better control. Negative for falls.   Skin: Negative for itching and rash.   Neurological: Negative for dizziness, seizures, loss of consciousness, weakness and headaches.   Psychiatric/Behavioral: Negative for depression, suicidal ideas and memory loss. The patient has been sleeping slightly better  Past Medical History  Diagnosis Date  . Coronary artery disease   . MI (myocardial infarction) 04/23/2007    inferior wall  . Sleep apnea   . Morbid  obesity 06/26/2012  . S/P CABG x 3 07/04/2012    LIMA to LAD, SVG to D1, SVG to PDA, EVH via right thigh  . Unspecified essential hypertension   . Type II or unspecified type diabetes mellitus without mention of complication, not stated as uncontrolled    Current Outpatient Prescriptions on File Prior to Visit  Medication Sig Dispense Refill  . aspirin 81 MG chewable tablet Chew 81 mg by mouth once.       Marland Kitchen atorvastatin (LIPITOR) 80 MG tablet Take 1 tablet (80 mg total) by mouth daily at 6 PM.  30 tablet  3  . clopidogrel (PLAVIX) 75 MG tablet Take 1 tablet (75 mg total) by mouth daily with breakfast.  30 tablet  3  . glucose blood (ACCU-CHEK ACTIVE STRIPS) test strip Use as instructed  100 each  12  . metFORMIN (GLUCOPHAGE) 1000 MG tablet Take 1 tablet (1,000 mg total) by mouth 2 (two) times daily with a meal.  60 tablet  3  . metoprolol tartrate (LOPRESSOR) 25 MG tablet Take 0.5 tablets (12.5 mg total) by mouth 2 (two) times daily.  30 tablet  3   No current facility-administered medications on file prior to visit.    Past Surgical History  Procedure Laterality Date  . Coronary angioplasty with stent placement  04/23/2007    PCI and stenting of mid RCA - Dr Bary Castilla @ [facility]  . Coronary artery bypass graft N/A 07/04/2012    Procedure: CORONARY  ARTERY BYPASS GRAFTING (CABG);  Surgeon: [name], MD;  Location: [facility] OR;  Service: Open Heart Surgery;  Laterality: N/A;  x3 using right greater saphenous vein and left internal mammary.   . Intraoperative transesophageal echocardiogram N/A 07/04/2012    Procedure: INTRAOPERATIVE TRANSESOPHAGEAL ECHOCARDIOGRAM;  Surgeon: [name], MD;  Location: [facility] OR;  Service: Open Heart Surgery;  Laterality: N/A;    Physical exam  BP 130/74  Pulse 72  Temp(Src) 99 F (37.2 C) (Oral)  Ht 5' 5.5" (1.664 m)  Wt 256 lb (116.121 kg)  BMI 41.94 kg/m2  gen- obese male in NAD HEENT- no pallor, no icterus, no LAD, MMM, no carotid bruits left PERRLA,  right prosthetic eye cvs- ns 1,s2, rrr, sternal scar healing well, graft site skin healed well respi- CTAB, no wheeze or rhonchi, no crackles abdo- bs+, soft, non tender, distended Ext- able to move all 4, no edema, distal pulses palpable Neuro- aao x3, no focal deficit, normal reflexes- foot exam done Psych- low mood, not tearful, normal thought process Skin- erythema on the skin on the face around the mask site from yesterday   Labs-  09/01/12 cmp glu 214, bun 9, cr 0.98, na 139, k 4.6, cl 101, co2 22, ca 9.7, t.chol 120, tg 110, hdl 31, ldl 67  6/14 a1c 10.1   Assessment/Plan  Diabetes mellitus- a1c 10.1. Continue metformin 1000 mg bid for now and monitor cbg. Check a1c today. Has microalbuminuria. Will start him on lisinopril 5 mg daily for renoprotective effect. Continue statin and ASA. Goal a1c<7. uptodate with pneumococcal vaccine. Will provide influenza vaccine  Ingrown toe nail- podiatry referral provided  CAD- s/p CABG, continue statin, metoprolol, asa and plavix for now  Depression and anxiety- will continue sertraline 50 mg daily. Monitor clinically  Morbid obesity- has co-morbidities with CAD, HL and DM type 2. Has lost some weight. Congratulated him. Continue following her diet  OSA- reviewed sleep study result. Referral provided to Dr Shelle Iron for cpap titration  Hyperlipidemia- continue lipitor 80 mg daily and monitor for now. ldl is at goal

## 2012-10-11 LAB — HEMOGLOBIN A1C: Hgb A1c MFr Bld: 9 % — ABNORMAL HIGH (ref 4.8–5.6)

## 2012-10-25 ENCOUNTER — Ambulatory Visit (INDEPENDENT_AMBULATORY_CARE_PROVIDER_SITE_OTHER): Payer: 59 | Admitting: Nurse Practitioner

## 2012-10-25 ENCOUNTER — Encounter: Payer: Self-pay | Admitting: Nurse Practitioner

## 2012-10-25 VITALS — BP 120/80 | HR 60 | Ht 65.5 in | Wt 255.2 lb

## 2012-10-25 DIAGNOSIS — Z1211 Encounter for screening for malignant neoplasm of colon: Secondary | ICD-10-CM | POA: Insufficient documentation

## 2012-10-25 DIAGNOSIS — Z951 Presence of aortocoronary bypass graft: Secondary | ICD-10-CM

## 2012-10-25 NOTE — Progress Notes (Signed)
I agree.  Colonoscopy for routine screening is not urgent.  Can wait until he is safely able to hold plavix (per cardiology).  He does not necessarily need deep sedation with propofol.  Can his appt be changed to a moderate sedation day rather than the extra expense of MAC sedation.

## 2012-10-25 NOTE — Progress Notes (Signed)
HPI :  Patient is a 57 year old male referred for colon cancer screening. Patient has never had a colonoscopy. He denies having any GI problems such as bowel changes, blood in stool, unexplained loss.    Patient has history of coronary artery disease, status post stenting 2002. He had emergent stenting 06/25/12 in setting a myocardial infarction. The procedure was complicated by spiral disseciton of dominant RCA and he ultimately had CABG 07/04/12. Patient is on plavix.   Past Medical History  Diagnosis Date  . Coronary artery disease   . MI (myocardial infarction) 04/23/2007    inferior wall  . Sleep apnea   . Morbid obesity 06/26/2012  . S/P CABG x 3 07/04/2012    LIMA to LAD, SVG to D1, SVG to PDA, EVH via right thigh  . Unspecified essential hypertension   . Type II or unspecified type diabetes mellitus without mention of complication, not stated as uncontrolled     Family History  Problem Relation Age of Onset  . Cancer Mother   . Diabetes Sister   . Diabetes Brother    History  Substance Use Topics  . Smoking status: Never Smoker   . Smokeless tobacco: Never Used  . Alcohol Use: No   Current Outpatient Prescriptions  Medication Sig Dispense Refill  . aspirin 81 MG chewable tablet Chew 81 mg by mouth once.       Marland Kitchen atorvastatin (LIPITOR) 80 MG tablet Take 1 tablet (80 mg total) by mouth daily at 6 PM.  30 tablet  3  . clopidogrel (PLAVIX) 75 MG tablet Take 1 tablet (75 mg total) by mouth daily with breakfast.  30 tablet  3  . glucose blood (ACCU-CHEK ACTIVE STRIPS) test strip Use as instructed  100 each  12  . lisinopril (PRINIVIL,ZESTRIL) 5 MG tablet Take 1 tablet (5 mg total) by mouth daily.  90 tablet  3  . metFORMIN (GLUCOPHAGE) 1000 MG tablet Take 1 tablet (1,000 mg total) by mouth 2 (two) times daily with a meal.  60 tablet  3  . metoprolol tartrate (LOPRESSOR) 25 MG tablet Take 0.5 tablets (12.5 mg total) by mouth 2 (two) times daily.  30 tablet  3  . sertraline (ZOLOFT)  50 MG tablet Take 50 mg by mouth daily. Take one tablet daily       No current facility-administered medications for this visit.   No Known Allergies  Review of Systems: All systems reviewed and negative except where noted in HPI.   Physical Exam: BP 120/80  Pulse 60  Ht 5' 5.5" (1.664 m)  Wt 255 lb 3.2 oz (115.758 kg)  BMI 41.81 kg/m2 Constitutional: Pleasant,obese white male in no acute distress. HEENT: Normocephalic and atraumatic. Conjunctivae are normal. No scleral icterus. Neck supple.  Cardiovascular: Normal rate, regular rhythm.  Pulmonary/chest: Effort normal and breath sounds normal. No wheezing, rales or rhonchi. Abdominal: Soft, obese, mild lower mid tenderness around area of previous hernial repair. Bowel sounds active throughout. There are no masses palpable. No hepatomegaly. Extremities: no edema Neurological: Alert and oriented to person place and time. Skin: Skin is warm and dry. No rashes noted. Psychiatric: Normal mood and affect. Behavior is normal.   ASSESSMENT AND PLAN:  7. 57 year old male for colon cancer screening. Patient is already scheduled for procedure 11/07/12 but he is on Plavix and underwent CABG mid June following an MI. Patient isn't having any GI symptoms, he has no [facility] of colon cancer. It wouldn't be unreasonable to  wait for colonoscopy until patient is a year out from CABG. Will send a note to cardiologist and if there is any concern about doing colonoscopy then it will be postponed. Additionally, it is preferable that patient be off plavix for procedure but this may be unacceptable. Will await cardiologist's input. Explained all this to the patient and he is comfortable waiting on colonoscopy if need be.  2. Post-op anemia, hgb stabilized in mid 11 to mid 12 range  3. Obesity, trying to loose weight  4  Diabetes, on oral agents

## 2012-10-25 NOTE — Patient Instructions (Addendum)
You have been scheduled for a colonoscopy with propofol. Please follow written instructions given to you at your visit today.  If your Cardiologist wants to proceed with this procedure, we will sent the prep to your pharmacy for you to pick up.  We will contact you once we hear from him regarding the Plavix medication also. If you use inhalers (even only as needed), please bring them with you on the day of your procedure.

## 2012-10-26 ENCOUNTER — Other Ambulatory Visit: Payer: Self-pay | Admitting: *Deleted

## 2012-10-26 ENCOUNTER — Encounter: Payer: Self-pay | Admitting: *Deleted

## 2012-10-30 ENCOUNTER — Ambulatory Visit (INDEPENDENT_AMBULATORY_CARE_PROVIDER_SITE_OTHER): Payer: 59 | Admitting: Cardiovascular Disease

## 2012-10-30 ENCOUNTER — Encounter: Payer: Self-pay | Admitting: Cardiovascular Disease

## 2012-10-30 ENCOUNTER — Encounter: Payer: Self-pay | Admitting: *Deleted

## 2012-10-30 VITALS — BP 124/80 | HR 66 | Ht 65.0 in | Wt 252.2 lb

## 2012-10-30 DIAGNOSIS — IMO0001 Reserved for inherently not codable concepts without codable children: Secondary | ICD-10-CM

## 2012-10-30 DIAGNOSIS — Z951 Presence of aortocoronary bypass graft: Secondary | ICD-10-CM

## 2012-10-30 DIAGNOSIS — E1165 Type 2 diabetes mellitus with hyperglycemia: Secondary | ICD-10-CM

## 2012-10-30 DIAGNOSIS — E781 Pure hyperglyceridemia: Secondary | ICD-10-CM

## 2012-10-30 NOTE — Progress Notes (Signed)
10/30/2012 Barry [name]   09-Oct-1955  [id number]  Primary Physician Oneal Grout, MD Primary Cardiologist: [name] MD Roseanne Reno   HPI:  57 yo with history of CAD s/p MI about 4 years ago and PCI in [place] presents to ER today with NSTEMI. Patient has been having exertional dyspnea but not chest pain for about a month. The shortness of breath comes with walking more than just a short distance. Last night, he developed a severe headache. The headache resolved after Ibuprofen but he later woke up with substernal chest pressure. This resolved in about 1 hour (he iced his chest) and he went back to sleep. This morning, while making breakfast, the chest pain returned. It was severe 10/10 like an elephant on his chest. He went to the ER and had morphine and NTG sublingual. He has just been started on NTG gtt at 5. CP is down to 4/10 (much impoved but still there). Initial TnI was 0.47. Initial ECG showed slight ST elevation in II but repeat ECG showed that this had basically resolved. Because of a slightly increased troponin and mildly increased chest pain with increasing doses of IV nitroglycerin Dr. Carolanne Grumbling evaluated the patient and felt that the patient should come to the Cath Lab for intervention. He ultimately underwent coronary artery bypass grafting x3 by Dr. Ashley Mariner on 07/04/12 after his DA PT washed out. He was placed on Integrilin in the interim. He did well on his has recuperated nicely. He plans on participating in cardiac rehabilitation. Since I saw him back 08/17/12 his remained completely asymptomatic.    Current Outpatient Prescriptions  Medication Sig Dispense Refill  . aspirin 81 MG chewable tablet Chew 81 mg by mouth once.       Marland Kitchen atorvastatin (LIPITOR) 80 MG tablet Take 1 tablet (80 mg total) by mouth daily at 6 PM.  30 tablet  3  . clopidogrel (PLAVIX) 75 MG tablet Take 1 tablet (75 mg total) by mouth daily with breakfast.  30 tablet  3  . glucose  blood (ACCU-CHEK ACTIVE STRIPS) test strip Use as instructed  100 each  12  . lisinopril (PRINIVIL,ZESTRIL) 5 MG tablet Take 1 tablet (5 mg total) by mouth daily.  90 tablet  3  . metFORMIN (GLUCOPHAGE) 1000 MG tablet Take 1 tablet (1,000 mg total) by mouth 2 (two) times daily with a meal.  60 tablet  3  . metoprolol tartrate (LOPRESSOR) 25 MG tablet Take 0.5 tablets (12.5 mg total) by mouth 2 (two) times daily.  30 tablet  3  . sertraline (ZOLOFT) 50 MG tablet Take 50 mg by mouth daily. Take one tablet daily       No current facility-administered medications for this visit.    No Known Allergies  History   Social History  . Marital Status: Married    Spouse Name: N/A    Number of Children: 1  . Years of Education: N/A   Occupational History  . MAINTENANCE    Social History Main Topics  . Smoking status: Never Smoker   . Smokeless tobacco: Never Used  . Alcohol Use: No  . Drug Use: No  . Sexual Activity: Not on file   Other Topics Concern  . Not on file   Social History Narrative  . No narrative on file     Review of Systems: General: negative for chills, fever, night sweats or weight changes.  Cardiovascular: negative for chest pain, dyspnea on  exertion, edema, orthopnea, palpitations, paroxysmal nocturnal dyspnea or shortness of breath Dermatological: negative for rash Respiratory: negative for cough or wheezing Urologic: negative for hematuria Abdominal: negative for nausea, vomiting, diarrhea, bright red blood per rectum, melena, or hematemesis Neurologic: negative for visual changes, syncope, or dizziness All other systems reviewed and are otherwise negative except as noted above.    Blood pressure 124/80, pulse 66, height 5\' 5"  (1.651 m), weight 252 lb 3.2 oz (114.397 kg).  General appearance: alert and no distress Neck: no adenopathy, no carotid bruit, no JVD, supple, symmetrical, trachea midline and thyroid not enlarged, symmetric, no  tenderness/mass/nodules Lungs: clear to auscultation bilaterally Heart: regular rate and rhythm, S1, S2 normal, no murmur, click, rub or gallop Extremities: extremities normal, atraumatic, no cyanosis or edema  EKG normal sinus rhythm at 66 without ST or T wave changes  ASSESSMENT AND PLAN:   S/P CABG x 3 None any 06/25/12 treated with PCI and stenting using drug-eluting stents. He had residual disease and underwent staged coronary artery bypass grafting x3 by Dr. Ashley Mariner on 07/04/12 with a LIMA to his LAD, vein grafts to a diagonal branch and PDA. This was done after washout of his antiplatelet agents on Integrilin. He remains asymptomatic. He completed cardiac rehabilitation.  Hypertriglyceridemia On statin therapy with recent lipid profile performed by his PCP on 09/02/12 revealing a total cholesterol of 122 LDL of 67 and HDL of 31.      [name] MD FACP,FACC,FAHA, [facility] 10/30/2012 5:06 PM

## 2012-10-30 NOTE — Assessment & Plan Note (Signed)
None any 06/25/12 treated with PCI and stenting using drug-eluting stents. He had residual disease and underwent staged coronary artery bypass grafting x3 by Dr. Ashley Mariner on 07/04/12 with a LIMA to his LAD, vein grafts to a diagonal branch and PDA. This was done after washout of his antiplatelet agents on Integrilin. He remains asymptomatic. He completed cardiac rehabilitation.

## 2012-10-30 NOTE — Patient Instructions (Signed)
Your physician recommends that you schedule a follow-up appointment in 6 month with extender.  Your physician wants you to follow-up in  12 month Dr Allyson Sabal.  You will receive a reminder letter in the mail two months in advance. If you don't receive a letter, please call our office to schedule the follow-up appointment.

## 2012-10-30 NOTE — Assessment & Plan Note (Signed)
On statin therapy with recent lipid profile performed by his PCP on 09/02/12 revealing a total cholesterol of 122 LDL of 67 and HDL of 31.

## 2012-10-31 ENCOUNTER — Telehealth: Payer: Self-pay | Admitting: *Deleted

## 2012-10-31 NOTE — Telephone Encounter (Signed)
Called Willette Cluster NP-C to advise her this patient had a NSTEMI yesterday on 10-30-2012.  Gunnar Fusi will read this office note  And speak to Dr. Christella Hartigan. This patient will not be having a colonoscopy due to this situation.

## 2012-11-01 ENCOUNTER — Telehealth: Payer: Self-pay | Admitting: *Deleted

## 2012-11-01 NOTE — Telephone Encounter (Signed)
Called Willette Cluster NP-C at the hospital and advised her of the patient having an office visit on 10-30-2012 and had an NSTEMI on that day.  We discussed this and concluded the patient will not be able to have a colonoscopy at this time. She will call Dr. Rob Bunting with this information.

## 2012-11-01 NOTE — Telephone Encounter (Signed)
Message copied by Derry Skill on Wed Nov 01, 2012  8:53 AM ------      Message from: Meredith Pel      Created: Thu Oct 26, 2012  9:57 AM       Azora Bonzo, this patient may not be able to get colon since just had heart surgery. Waiting to her from cardiologist. IF cardiologist says okay then Christella Hartigan wants patient rescheduled to a day he doesn't do MAC.       ----- Message -----         From: Rachael Fee, MD         Sent: 10/25/2012  12:19 PM           To: Meredith Pel, NP                        ----- Message -----         From: Meredith Pel, NP         Sent: 10/25/2012  11:23 AM           To: Rachael Fee, MD                   ------

## 2012-11-07 ENCOUNTER — Other Ambulatory Visit: Payer: Self-pay | Admitting: *Deleted

## 2012-11-07 ENCOUNTER — Other Ambulatory Visit: Payer: 59 | Admitting: Gastroenterology

## 2012-11-07 MED ORDER — SERTRALINE HCL 50 MG PO TABS: 50.0000 mg | ORAL_TABLET | Freq: Every day | ORAL | Status: DC

## 2012-11-13 ENCOUNTER — Encounter: Payer: Self-pay | Admitting: Pulmonary Disease

## 2012-11-13 ENCOUNTER — Encounter: Payer: Self-pay | Admitting: *Deleted

## 2012-11-13 ENCOUNTER — Ambulatory Visit (INDEPENDENT_AMBULATORY_CARE_PROVIDER_SITE_OTHER): Payer: 59 | Admitting: Pulmonary Disease

## 2012-11-13 VITALS — BP 128/84 | HR 83 | Temp 98.5°F | Ht 65.0 in | Wt 254.0 lb

## 2012-11-13 DIAGNOSIS — G4733 Obstructive sleep apnea (adult) (pediatric): Secondary | ICD-10-CM

## 2012-11-13 NOTE — Progress Notes (Signed)
Subjective:    Patient ID: Barry [name], male    DOB: Jan 16, 1956, 57 y.o.   MRN: [id number]  HPI The pt is a 57 y/o male who I have been asked to see for management of osa.  He was first diagnosed in 2008 with an AHI of 69 events per hour, and was started on CPAP with good response.  He did fairly well with CPAP until recently, when his machine and began to make noises, and his heater quit working on his humidifier.  He has not used his CPAP device for the last one month.  He has had a followup sleep study after his bypass surgery, and was found to have an AHI of 77 events per hour with both obstructive and central events, and desaturation as low as 76%.  The patient feels that he is not sleeping well without CPAP, and is very tired in the mornings upon awakening.  He does not describe daytime sleepiness, but does feel fatigue.  He states his weight is up 10 pounds over the last 2 years, and his Epworth score today is 7.   Sleep Questionnaire What time do you typically go to bed?( Between what hours) 9p-10p 9p-10p at 1612 on 11/13/12 by Maisie Fus, CMA How long does it take you to fall asleep? - 1hr - 1hr at 1612 on 11/13/12 by Maisie Fus, CMA How many times during the night do you wake up? 3 3 at 1612 on 11/13/12 by Maisie Fus, CMA What time do you get out of bed to start your day? 0700 0700 at 1612 on 11/13/12 by Maisie Fus, CMA Do you drive or operate heavy machinery in your occupation? No No at 1612 on 11/13/12 by Maisie Fus, CMA How much has your weight changed (up or down) over the past two years? (In pounds) 10 lb (4.536 kg) 10 lb (4.536 kg) at 1612 on 11/13/12 by Maisie Fus, CMA Have you ever had a sleep study before? Yes Yes at 1612 on 11/13/12 by Maisie Fus, CMA If yes, location of study? [facility] // [facility] // [place] at 1612 on 11/13/12 by Maisie Fus, CMA If yes, date of study?  2008//2014 2008//2014 at 1612 on 11/13/12 by Maisie Fus, CMA Do you currently use CPAP? Yes Yes at 1612 on 11/13/12 by Maisie Fus, CMA If so, what pressure? 4-11 4-11 at 1612 on 11/13/12 by Maisie Fus, CMA Do you wear oxygen at any time? Yes Yes at 1612 on 11/13/12 by Maisie Fus, CMA O2 Flow Rate (L/min) 2 L/min 2 L/min at 1612 on 11/13/12 by Maisie Fus, CMA   Review of Systems  Constitutional: Negative for fever and unexpected weight change.  HENT: Negative for congestion, dental problem, ear pain, nosebleeds, postnasal drip, rhinorrhea, sinus pressure, sneezing, sore throat and trouble swallowing.   Eyes: Negative for redness and itching.  Respiratory: Negative for cough, chest tightness, shortness of breath and wheezing.   Cardiovascular: Positive for chest pain ( since open heart sx). Negative for palpitations and leg swelling.  Gastrointestinal: Negative for nausea and vomiting.  Genitourinary: Negative for dysuria.  Musculoskeletal: Negative for joint swelling.  Skin: Negative for rash.  Neurological: Negative for headaches.  Hematological: Does not bruise/bleed easily.  Psychiatric/Behavioral: Positive for dysphoric mood ( since open heart sx). The patient is not nervous/anxious.        Objective:  Physical Exam Constitutional:  Obese male, no acute distress  HENT:  Nares patent without discharge  Oropharynx without exudate, palate and uvula are thick and elongated.  Eyes: right eye prosthetic, left with reactive pupil,  eomi, no scleral icterus  Neck:  No JVD, no TMG  Cardiovascular:  Normal rate, regular rhythm, no rubs or gallops.  No murmurs        Intact distal pulses  Pulmonary :  Normal breath sounds, no stridor or respiratory distress   No rales, rhonchi, or wheezing  Abdominal:  Soft, nondistended, bowel sounds present.  No tenderness noted.   Musculoskeletal:  1+ lower extremity edema noted.  Lymph Nodes:  No cervical  lymphadenopathy noted  Skin:  No cyanosis noted  Neurologic:  Alert, appropriate, moves all 4 extremities without obvious deficit.         Assessment & Plan:

## 2012-11-13 NOTE — Assessment & Plan Note (Signed)
The patient has a history of severe obstructive sleep apnea, and is currently having issues with his CPAP machine.  Because of this, he has not been able to weigh her, and now has seen a significant decline in his sleep and daytime energy level.  He is told like to try to get the patient a new CPAP device, but if he is not eligible, will need to use an automatic device to optimize his pressure again.  Will also need to make sure that his old machine is in working order if they are not going to replace it.  Finally, I have urged the patient to work aggressively on weight loss.

## 2012-11-13 NOTE — Patient Instructions (Signed)
Will send an order to get you a new cpap machine with auto mode, and will also optimize your pressure.   Make sure you keep up with mask changes and supplies. Work on weight loss followup with me in 6mos if doing well, but call if having issues.

## 2012-11-30 ENCOUNTER — Encounter: Payer: Self-pay | Admitting: Cardiovascular Disease

## 2013-01-09 ENCOUNTER — Encounter: Payer: Self-pay | Admitting: Internal Medicine

## 2013-01-09 ENCOUNTER — Ambulatory Visit (INDEPENDENT_AMBULATORY_CARE_PROVIDER_SITE_OTHER): Payer: 59 | Admitting: Internal Medicine

## 2013-01-09 VITALS — BP 118/64 | HR 69 | Temp 97.7°F | Wt 249.2 lb

## 2013-01-09 DIAGNOSIS — E1165 Type 2 diabetes mellitus with hyperglycemia: Secondary | ICD-10-CM

## 2013-01-09 DIAGNOSIS — I2510 Atherosclerotic heart disease of native coronary artery without angina pectoris: Secondary | ICD-10-CM

## 2013-01-09 DIAGNOSIS — F341 Dysthymic disorder: Secondary | ICD-10-CM

## 2013-01-09 DIAGNOSIS — E1169 Type 2 diabetes mellitus with other specified complication: Secondary | ICD-10-CM | POA: Insufficient documentation

## 2013-01-09 DIAGNOSIS — IMO0001 Reserved for inherently not codable concepts without codable children: Secondary | ICD-10-CM

## 2013-01-09 DIAGNOSIS — E785 Hyperlipidemia, unspecified: Secondary | ICD-10-CM

## 2013-01-09 DIAGNOSIS — F418 Other specified anxiety disorders: Secondary | ICD-10-CM

## 2013-01-09 DIAGNOSIS — N529 Male erectile dysfunction, unspecified: Secondary | ICD-10-CM

## 2013-01-09 MED ORDER — ATORVASTATIN CALCIUM 80 MG PO TABS: 80.0000 mg | ORAL_TABLET | Freq: Every day | ORAL | Status: DC

## 2013-01-09 MED ORDER — LISINOPRIL 5 MG PO TABS: 5.0000 mg | ORAL_TABLET | Freq: Every day | ORAL | Status: DC

## 2013-01-09 MED ORDER — SERTRALINE HCL 50 MG PO TABS: 50.0000 mg | ORAL_TABLET | Freq: Every day | ORAL | Status: DC

## 2013-01-09 MED ORDER — METFORMIN HCL 1000 MG PO TABS: 1000.0000 mg | ORAL_TABLET | Freq: Two times a day (BID) | ORAL | Status: DC

## 2013-01-09 MED ORDER — CLOPIDOGREL BISULFATE 75 MG PO TABS: 75.0000 mg | ORAL_TABLET | Freq: Every day | ORAL | Status: DC

## 2013-01-09 MED ORDER — SILDENAFIL CITRATE 50 MG PO TABS: 50.0000 mg | ORAL_TABLET | Freq: Every day | ORAL | Status: DC | PRN

## 2013-01-09 MED ORDER — METOPROLOL TARTRATE 25 MG PO TABS: 12.5000 mg | ORAL_TABLET | Freq: Two times a day (BID) | ORAL | Status: DC

## 2013-01-09 MED ORDER — GLUCOSE BLOOD VI STRP: ORAL_STRIP | Status: DC

## 2013-01-09 NOTE — Progress Notes (Signed)
Patient ID: Barry [name], male   DOB: 06/02/1955, 56 y.o.   MRN: [id number]  Chief Complaint  Patient presents with  . Medical Managment of Chronic Issues    3 month f/u   No Known Allergies  HPI 57 y/o male patient is here for his follow up. He has been exercising and eating healthy. cbg has been between 118-160. No hypoglycemic episodes His mood is stable on zoloft 50 mg daily. Reviewed his sleep study which shows severe OSA and central sleep apnea. He has been started on cpap machine and tolerating it well  Review of Systems   Constitutional: Negative for fever, chills and diaphoresis.   HENT: Negative for congestion and sore throat.    Eyes: Negative for blurred vision and double vision.        Prosthetic right eye   Respiratory: Negative for cough, sputum production, shortness of breath and wheezing.    Cardiovascular: Negative for chest pain, palpitations, orthopnea and PND.   Gastrointestinal: Negative for heartburn, nausea, vomiting, abdominal pain, diarrhea and constipation.   Genitourinary: Negative for dysuria.   Musculoskeletal: back pain is under better control. Negative for falls.   Skin: Negative for itching and rash.   Neurological: Negative for dizziness, seizures, loss of consciousness, weakness and headaches.   Psychiatric/Behavioral: Negative for depression, suicidal ideas and memory loss. Sleeping better  Past Medical History  Diagnosis Date  . Coronary artery disease   . MI (myocardial infarction) 04/23/2007    inferior wall  . Sleep apnea   . Morbid obesity 06/26/2012  . S/P CABG x 3 07/04/2012    LIMA to LAD, SVG to D1, SVG to PDA, EVH via right thigh  . Unspecified essential hypertension   . Type II or unspecified type diabetes mellitus without mention of complication, not stated as uncontrolled    Medication reviewed. See [facility]  Past Surgical History  Procedure Laterality Date  . Coronary angioplasty with stent placement  04/23/2007    PCI and stenting of  mid RCA - Dr Bary Castilla @ [facility]  . Coronary artery bypass graft N/A 07/04/2012    Procedure: CORONARY ARTERY BYPASS GRAFTING (CABG);  Surgeon: [name], MD;  Location: [facility] OR;  Service: Open Heart Surgery;  Laterality: N/A;  x3 using right greater saphenous vein and left internal mammary.   . Intraoperative transesophageal echocardiogram N/A 07/04/2012    Procedure: INTRAOPERATIVE TRANSESOPHAGEAL ECHOCARDIOGRAM;  Surgeon: [name], MD;  Location: [facility] OR;  Service: Open Heart Surgery;  Laterality: N/A;   Physical exam BP 118/64  Pulse 69  Temp(Src) 97.7 F (36.5 C) (Oral)  Wt 249 lb 3.2 oz (113.036 kg)  SpO2 97%  gen- obese male in NAD HEENT- no pallor, no icterus, no LAD, MMM, no carotid bruits, left PERRLA, right prosthetic eye [facility]- ns 1,s2, rrr, sternal scar healing well, graft site skin healed well respi- CTAB, no wheeze or rhonchi, no crackles abdo- bs+, soft, non tender, distended Ext- able to move all 4, no edema, distal pulses palpable Neuro- aao x3, no focal deficit, normal reflexes Psych- low mood, not tearful, normal thought process Skin- erythema on the skin on the face around the mask site from yesterday    Labs-  Lab Results  Component Value Date   HGBA1C 9.0* 10/10/2012    CBC    Component Value Date/Time   WBC 11.3* 07/06/2012 0500   RBC 3.96* 07/06/2012 0500   HGB 11.7* 07/06/2012 0500   HCT 34.9* 07/06/2012 0500  PLT 120* 07/06/2012 0500   MCV 88.1 07/06/2012 0500   MCH 29.5 07/06/2012 0500   MCHC 33.5 07/06/2012 0500   RDW 13.6 07/06/2012 0500    CMP     Component Value Date/Time   NA 132* 07/06/2012 0500   K 3.9 07/06/2012 0500   CL 96 07/06/2012 0500   CO2 27 07/06/2012 0500   GLUCOSE 196* 07/06/2012 0500   BUN 12 07/06/2012 0500   CREATININE 0.86 07/06/2012 0500   CALCIUM 8.8 07/06/2012 0500   PROT 7.5 06/25/2012 1057   ALBUMIN 3.7 06/25/2012 1057   AST 29 06/25/2012 1057   ALT 50 06/25/2012 1057   ALKPHOS 100 06/25/2012 1057   BILITOT 0.5 06/25/2012 1057    GFRNONAA >90 07/06/2012 0500   GFRAA >90 07/06/2012 0500   Lipid Panel     Component Value Date/Time   CHOL 165 06/26/2012 0355   TRIG 247* 06/26/2012 0355   HDL 30* 06/26/2012 0355   CHOLHDL 5.5 06/26/2012 0355   VLDL 49* 06/26/2012 0355   LDLCALC 86 06/26/2012 0355    Assessment/Plan  Diabetes mellitus- Continue metformin 1000 mg bid for now and monitor cbg. Check a1c today. Continue lisinopril 5 mg daily for renoprotective effect. Continue statin and ASA. Goal a1c<7. uptodate with pneumococcal vaccine and influenza vaccine  Hyperlipidemia- continue lipitor 80 mg daily and monitor for now. ldl is at goal  CAD- s/p CABG, continue statin, metoprolol, asa and plavix for now  Depression and anxiety- will continue sertraline 50 mg daily. Monitor clinically  Erectile dysfunction associated with type 2 diabetes mellitus- will start him on viagra 50 mg daily as needed and reassess. Common side effects explained

## 2013-01-10 LAB — CBC WITH DIFFERENTIAL/PLATELET
Basos: 1 %
Eos: 2 %
Eosinophils Absolute: 0.2 10*3/uL (ref 0.0–0.4)
HCT: 42.7 % (ref 37.5–51.0)
Hemoglobin: 13.5 g/dL (ref 12.6–17.7)
Lymphocytes Absolute: 2 10*3/uL (ref 0.7–3.1)
MCH: 27.3 pg (ref 26.6–33.0)
MCV: 86 fL (ref 79–97)
Monocytes Absolute: 0.7 10*3/uL (ref 0.1–0.9)
Monocytes: 7 %
Neutrophils Absolute: 6.4 10*3/uL (ref 1.4–7.0)
Neutrophils Relative %: 68 %
RBC: 4.95 x10E6/uL (ref 4.14–5.80)

## 2013-01-10 LAB — COMPREHENSIVE METABOLIC PANEL
Albumin: 4.3 g/dL (ref 3.5–5.5)
Alkaline Phosphatase: 109 IU/L (ref 39–117)
BUN/Creatinine Ratio: 18 (ref 9–20)
BUN: 17 mg/dL (ref 6–24)
Creatinine, Ser: 0.93 mg/dL (ref 0.76–1.27)
GFR calc Af Amer: 106 mL/min/{1.73_m2} (ref >59)
Globulin, Total: 2.9 g/dL (ref 1.5–4.5)
Total Bilirubin: <0.2 mg/dL (ref 0.0–1.2)

## 2013-01-10 LAB — LIPID PANEL
Chol/HDL Ratio: 3.8 ratio units (ref 0.0–5.0)
LDL Calculated: 61 mg/dL (ref 0–99)
Triglycerides: 121 mg/dL (ref 0–149)
VLDL Cholesterol Cal: 24 mg/dL (ref 5–40)

## 2013-01-10 LAB — HEMOGLOBIN A1C: Est. average glucose Bld gHb Est-mCnc: 171 mg/dL

## 2013-04-11 ENCOUNTER — Telehealth: Payer: Self-pay | Admitting: *Deleted

## 2013-04-11 ENCOUNTER — Other Ambulatory Visit: Payer: Self-pay | Admitting: *Deleted

## 2013-04-11 NOTE — Telephone Encounter (Signed)
Patient called stating that his arms and hands are itching. Wants something called in. Told patient we would not be able to call something in without seeing him first. Scheduled an appointment for him to be seen tomorrow.

## 2013-04-12 ENCOUNTER — Encounter: Payer: Self-pay | Admitting: Nurse Practitioner

## 2013-04-12 ENCOUNTER — Ambulatory Visit (INDEPENDENT_AMBULATORY_CARE_PROVIDER_SITE_OTHER): Payer: 59 | Admitting: Nurse Practitioner

## 2013-04-12 VITALS — BP 134/78 | HR 72 | Temp 100.1°F | Wt 253.2 lb

## 2013-04-12 DIAGNOSIS — B86 Scabies: Secondary | ICD-10-CM

## 2013-04-12 MED ORDER — HYDROXYZINE HCL 50 MG PO TABS: 50.0000 mg | ORAL_TABLET | Freq: Three times a day (TID) | ORAL | Status: DC | PRN

## 2013-04-12 MED ORDER — PERMETHRIN 5 % EX CREA: TOPICAL_CREAM | CUTANEOUS | Status: DC

## 2013-04-12 NOTE — Patient Instructions (Signed)
Scabies  Scabies are small bugs (mites) that burrow under the skin and cause red bumps and severe itching. These bugs can only be seen with a microscope. Scabies are highly contagious. They can spread easily from person to person by direct contact. They are also spread through sharing clothing or linens that have the scabies mites living in them. It is not unusual for an entire family to become infected through shared towels, clothing, or bedding.   HOME CARE INSTRUCTIONS   · Your caregiver may prescribe a cream or lotion to kill the mites. If cream is prescribed, massage the cream into the entire body from the neck to the bottom of both feet. Also massage the cream into the scalp and face if your child is less than 1 year old. Avoid the eyes and mouth. Do not wash your hands after application.  · Leave the cream on for 8 to 12 hours. Your child should bathe or shower after the 8 to 12 hour application period. Sometimes it is helpful to apply the cream to your child right before bedtime.  · One treatment is usually effective and will eliminate approximately 95% of infestations. For severe cases, your caregiver may decide to repeat the treatment in 1 week. Everyone in your household should be treated with one application of the cream.  · New rashes or burrows should not appear within 24 to 48 hours after successful treatment. However, the itching and rash may last for 2 to 4 weeks after successful treatment. Your caregiver may prescribe a medicine to help with the itching or to help the rash go away more quickly.  · Scabies can live on clothing or linens for up to 3 days. All of your child's recently used clothing, towels, stuffed toys, and bed linens should be washed in hot water and then dried in a dryer for at least 20 minutes on high heat. Items that cannot be washed should be enclosed in a plastic bag for at least 3 days.  · To help relieve itching, bathe your child in a cool bath or apply cool washcloths to the  affected areas.  · Your child may return to school after treatment with the prescribed cream.  SEEK MEDICAL CARE IF:   · The itching persists longer than 4 weeks after treatment.  · The rash spreads or becomes infected. Signs of infection include red blisters or yellow-tan crust.  Document Released: 01/11/2005 Document Revised: 04/05/2011 Document Reviewed: 05/22/2008  Exit[employer]® Patient Information ©2014 [employer].

## 2013-04-12 NOTE — Progress Notes (Signed)
Patient ID: Barry HurterLeon [name], male   DOB: 01/17/1956, 58 y.o.   MRN: [id number]    No Known Allergies  Chief Complaint  Patient presents with  . Pruritis    x 3-4 weeks patient c/o hand and arm itching. No change in daily routines.  Patient tried OTC medications     HPI: Patient is a 58 y.o. 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 Exteremly itchy in between fingers, wrist, back of elbows, back of knees and reports excoriated groin.  -pt has done extended research online for scabies and has already started to treat his home Review of Systems:  Review of Systems  Constitutional: Negative for fever, chills and malaise/fatigue.  Respiratory: Negative for cough and shortness of breath.   Cardiovascular: Negative for chest pain.  Gastrointestinal: Negative for abdominal pain, diarrhea and constipation.  Genitourinary: Negative for dysuria.  Musculoskeletal: Negative for myalgias.  Skin: Positive for itching and rash.  Neurological: Negative for weakness and headaches.     Past Medical History  Diagnosis Date  . Coronary artery disease   . MI (myocardial infarction) 04/23/2007    inferior wall  . Sleep apnea   . Morbid obesity 06/26/2012  . S/P CABG x 3 07/04/2012    LIMA to LAD, SVG to D1, SVG to PDA, EVH via right thigh  . Unspecified essential hypertension   . Type II or unspecified type diabetes mellitus without mention of complication, not stated as uncontrolled    Past Surgical History  Procedure Laterality Date  . Coronary angioplasty with stent placement  04/23/2007    PCI and stenting of mid RCA - Dr Barry [name] [facility]  . Coronary artery bypass graft N/A 07/04/2012    Procedure: CORONARY ARTERY BYPASS GRAFTING (CABG);  Surgeon: Barry Nailslarence H Owen, MD;  [facility];  Service: Open Heart Surgery;  Laterality: N/A;  x3 using right  greater saphenous vein and left internal mammary.   . Intraoperative transesophageal echocardiogram N/A 07/04/2012    Procedure: INTRAOPERATIVE TRANSESOPHAGEAL ECHOCARDIOGRAM;  Surgeon: Barry Nailslarence H Owen, MD;  [facility];  Service: Open Heart Surgery;  Laterality: N/A;   Social History:   reports that he has never smoked. He has never used smokeless tobacco. He reports that he does not drink alcohol or use illicit drugs.  Family History  Problem Relation Age of Onset  . Cancer Mother   . Diabetes Sister   . Diabetes Brother     Medications: Patient's Medications  New Prescriptions   HYDROXYZINE (ATARAX/VISTARIL) 50 MG TABLET    Take 1 tablet (50 mg total) by mouth 3 (three) times daily as needed for itching (for itching).   PERMETHRIN (ACTICIN) 5 % CREAM    Apply to whole body and repeat treatment in 7 days  Previous Medications   ASPIRIN 81 MG CHEWABLE TABLET    Chew 81 mg by mouth once.    ATORVASTATIN (LIPITOR) 80 MG TABLET    Take 1 tablet (80 mg total) by mouth daily at 6 PM.   CLOPIDOGREL (PLAVIX) 75 MG TABLET    Take 1 tablet (75 mg total) by mouth daily with breakfast.   GLUCOSE BLOOD (ACCU-CHEK AVIVA PLUS) TEST STRIP    Use as instructed   LISINOPRIL (PRINIVIL,ZESTRIL) 5 MG TABLET    Take 1  tablet (5 mg total) by mouth daily.   METFORMIN (GLUCOPHAGE) 1000 MG TABLET    Take 1 tablet (1,000 mg total) by mouth 2 (two) times daily with a meal.   METOPROLOL TARTRATE (LOPRESSOR) 25 MG TABLET    Take 0.5 tablets (12.5 mg total) by mouth 2 (two) times daily.   SERTRALINE (ZOLOFT) 50 MG TABLET    Take 1 tablet (50 mg total) by mouth daily.   SILDENAFIL (VIAGRA) 50 MG TABLET    Take 1 tablet (50 mg total) by mouth daily as needed for erectile dysfunction.  Modified Medications   No medications on file  Discontinued Medications   No medications on file     Physical Exam:  Filed Vitals:   04/12/13 1531  BP: 134/78  Pulse: 72  Temp: 100.1 F (37.8 C) repeat temp of 98.4    TempSrc: Oral  Weight: 253 lb 3.2 oz (114.851 kg)  SpO2: 97%    Physical Exam  Vitals reviewed. Constitutional: He is well-developed, well-nourished, and in no distress.  Cardiovascular: Normal rate, regular rhythm and normal heart sounds.   Pulmonary/Chest: Effort normal and breath sounds normal. No respiratory distress.  Abdominal: Soft. Bowel sounds are normal. He exhibits no distension.  Skin: Skin is warm and dry. Rash noted. There is erythema.  Tracking papular rash between digits, on wrists bilateral, and antecubital fossa   Psychiatric: Affect normal.     Labs reviewed: Basic Metabolic Panel:  Recent Labs  14/78/29 1057  06/25/12 1911  07/04/12 2030  07/05/12 0400 07/05/12 1655 07/06/12 0500 01/09/13 1712  NA 132*  --   --   < >  --   < > 139  --  132* 142  K 4.1  --   --   < >  --   < > 4.5  --  3.9 5.1  CL 97  --   --   < >  --   < > 107  --  96 100  CO2 23  --   --   < >  --   --  24  --  27 23  GLUCOSE 302*  --   --   < >  --   < > 127*  --  196* 142*  BUN 14  --   --   < >  --   < > 9  --  12 17  CREATININE 0.78  --   --   < > 0.82  < > 0.81 0.95 0.86 0.93  CALCIUM 9.4  --   --   < >  --   --  8.0*  --  8.8 9.9  MG  --   < > 1.6  --  2.7*  --  2.3 2.0  --   --   TSH  --   --  1.704  --   --   --   --   --   --   --   < > = values in this interval not displayed. Liver Function Tests:  Recent Labs  06/25/12 1057 01/09/13 1712  AST 29 18  ALT 50 23  ALKPHOS 100 109  BILITOT 0.5 <0.2  PROT 7.5 7.2  ALBUMIN 3.7  --    No results found for this basename: LIPASE, AMYLASE,  in the last 8760 hours No results found for this basename: AMMONIA,  in the last 8760 hours CBC:  Recent Labs  07/05/12 0400 07/05/12 1655 07/06/12 0500 01/09/13  1712  WBC 9.0 12.4* 11.3* 9.3  NEUTROABS  --   --   --  6.4  HGB 11.6* 12.3* 11.7* 13.5  HCT 35.0* 37.1* 34.9* 42.7  MCV 89.1 89.4 88.1 86  PLT 135* 144* 120*  --    Lipid Panel:  Recent Labs  06/26/12 0355  01/09/13 1712  CHOL 165  --   HDL 30* 30*  LDLCALC 86 61  TRIG 247* 121  CHOLHDL 5.5 3.8   TSH:  Recent Labs  06/25/12 1911  TSH 1.704   A1C: No components found with this basename: A1C,    Assessment/Plan 1. Scabies -information discussed and provided on treatment of permethrin and treatment of home  - permethrin (ACTICIN) 5 % cream; Apply to whole body and repeat treatment in 7 days  Dispense: 60 g; Refill: 1 - hydrOXYzine (ATARAX/VISTARIL) 50 MG tablet; Take 1 tablet (50 mg total) by mouth 3 (three) times daily as needed for itching (for itching).  Dispense: 90 tablet; Refill: 0 -to repeat whole treatment of body and house in 7 days  2. Erectile dysfunction Viagra 50 mg samples given x 8 pills; pt reports 50 mg dose does not work as well as expected; can take 2 tablets to see if this help erection; to follow up with Glade Lloyd regarding ED at next visit.   To follow up with Dr Glade Lloyd for routine follow up, sooner if needed due to treatment failure

## 2013-04-23 ENCOUNTER — Other Ambulatory Visit: Payer: Self-pay | Admitting: Internal Medicine

## 2013-04-23 NOTE — Telephone Encounter (Signed)
Pt has upcoming appt on 05/14/13. Is it okay to refill his Viagra? Please advise. thanks

## 2013-04-23 NOTE — Telephone Encounter (Signed)
rx sent to pharmacy by Glade Lloyd.

## 2013-05-14 ENCOUNTER — Ambulatory Visit: Payer: 59 | Admitting: Pulmonary Disease

## 2013-05-15 ENCOUNTER — Ambulatory Visit (INDEPENDENT_AMBULATORY_CARE_PROVIDER_SITE_OTHER): Payer: 59 | Admitting: Internal Medicine

## 2013-05-15 ENCOUNTER — Encounter: Payer: Self-pay | Admitting: Internal Medicine

## 2013-05-15 VITALS — BP 126/86 | HR 77 | Temp 97.5°F | Resp 14 | Ht 65.0 in | Wt 245.2 lb

## 2013-05-15 DIAGNOSIS — F418 Other specified anxiety disorders: Secondary | ICD-10-CM

## 2013-05-15 DIAGNOSIS — I2510 Atherosclerotic heart disease of native coronary artery without angina pectoris: Secondary | ICD-10-CM

## 2013-05-15 DIAGNOSIS — E1165 Type 2 diabetes mellitus with hyperglycemia: Secondary | ICD-10-CM

## 2013-05-15 DIAGNOSIS — IMO0002 Reserved for concepts with insufficient information to code with codable children: Secondary | ICD-10-CM

## 2013-05-15 DIAGNOSIS — N529 Male erectile dysfunction, unspecified: Secondary | ICD-10-CM

## 2013-05-15 DIAGNOSIS — G4733 Obstructive sleep apnea (adult) (pediatric): Secondary | ICD-10-CM

## 2013-05-15 DIAGNOSIS — E1169 Type 2 diabetes mellitus with other specified complication: Secondary | ICD-10-CM

## 2013-05-15 DIAGNOSIS — IMO0001 Reserved for inherently not codable concepts without codable children: Secondary | ICD-10-CM

## 2013-05-15 DIAGNOSIS — N521 Erectile dysfunction due to diseases classified elsewhere: Secondary | ICD-10-CM

## 2013-05-15 DIAGNOSIS — I10 Essential (primary) hypertension: Secondary | ICD-10-CM

## 2013-05-15 DIAGNOSIS — F341 Dysthymic disorder: Secondary | ICD-10-CM

## 2013-05-15 MED ORDER — SILDENAFIL CITRATE 100 MG PO TABS: ORAL_TABLET | ORAL | Status: DC

## 2013-05-15 NOTE — Progress Notes (Signed)
Patient ID: Barry HurterLeon [name], male   DOB: 12/22/1955, 58 y.o.   MRN: [id number]    Chief Complaint  Patient presents with  . Medical Management of Chronic Issues    1 month follow-up, No Complaints   No Known Allergies  HPI 58 y/o male patient is here for his follow up. He has lost weight since last visit. he has been exercising and eating healthy. cbg has been between 100-140. No hypoglycemic episodes His mood is stable on zoloft 50 mg daily. He is using cpap machine and tolerating it well He has been taking viagra 100 mg daily and this is working for him and would like his dose increased to 100 from 50  Review of Systems   Constitutional: Negative for fever, chills and diaphoresis.   HENT: Negative for congestion and sore throat.    Eyes: Negative for blurred vision and double vision.        Prosthetic right eye   Respiratory: Negative for cough, sputum production, shortness of breath and wheezing.    Cardiovascular: Negative for chest pain, palpitations, orthopnea and PND.   Gastrointestinal: Negative for heartburn, nausea, vomiting, abdominal pain, diarrhea and constipation.   Genitourinary: Negative for dysuria.   Musculoskeletal: back pain is under better control. Negative for falls.   Skin: Negative for itching and rash.   Neurological: Negative for dizziness, seizures, loss of consciousness, weakness and headaches.   Psychiatric/Behavioral: Negative for depression, suicidal ideas and memory loss. Has problem staying asleep  Past Medical History  Diagnosis Date  . Coronary artery disease   . MI (myocardial infarction) 04/23/2007    inferior wall  . Sleep apnea   . Morbid obesity 06/26/2012  . S/P CABG x 3 07/04/2012    LIMA to LAD, SVG to D1, SVG to PDA, EVH via right thigh  . Unspecified essential hypertension   . Type II or unspecified type diabetes mellitus without mention of complication, not stated as uncontrolled    Past Surgical History  Procedure Laterality Date  .  Coronary angioplasty with stent placement  04/23/2007    PCI and stenting of mid RCA - [facility]il [facility]  . Coronary artery bypass graft N/A 07/04/2012    Procedure: CORONARY ARTERY BYPASS GRAFTING (CABG);  Surgeon: Purcell Nailslarence H Owen, MD;  [facility];  Service: Open Heart Surgery;  Laterality: N/A;  x3 using right greater saphenous vein and left internal mammary.   . Intraoperative transesophageal echocardiogram N/A 07/04/2012    Procedure: INTRAOPERATIVE TRANSESOPHAGEAL ECHOCARDIOGRAM;  Surgeon: Purcell Nailslarence H Owen, MD;  [facility];  Service: Open Heart Surgery;  Laterality: N/A;   Current Outpatient Prescriptions on File Prior to Visit  Medication Sig Dispense Refill  . aspirin 81 MG chewable tablet Chew 81 mg by mouth once.       Marland Kitchen. atorvastatin (LIPITOR) 80 MG tablet Take 1 tablet (80 mg total) by mouth daily at 6 PM.  30 tablet  3  . clopidogrel (PLAVIX) 75 MG tablet Take 1 tablet (75 mg total) by mouth daily with breakfast.  30 tablet  3  . glucose blood (ACCU-CHEK AVIVA PLUS) test strip Use as instructed  100 each  1  . hydrOXYzine (ATARAX/VISTARIL) 50 MG tablet Take 1 tablet (50 mg total) by mouth 3 (three) times daily as needed for itching (for itching).  90 tablet  0  . lisinopril (PRINIVIL,ZESTRIL) 5 MG tablet Take 1 tablet (5 mg total) by mouth daily.  90 tablet  3  . metFORMIN (  GLUCOPHAGE) 1000 MG tablet Take 1 tablet (1,000 mg total) by mouth 2 (two) times daily with a meal.  60 tablet  3  . metoprolol tartrate (LOPRESSOR) 25 MG tablet Take 0.5 tablets (12.5 mg total) by mouth 2 (two) times daily.  30 tablet  3  . sertraline (ZOLOFT) 50 MG tablet Take 1 tablet (50 mg total) by mouth daily.  30 tablet  5   No current facility-administered medications on file prior to visit.    Physical exam BP 126/86  Pulse 77  Temp(Src) 97.5 F (36.4 C) (Oral)  Resp 14  Ht 5\' 5"  (1.651 m)  Wt 245 lb 3.2 oz (111.222 kg)  BMI 40.80 kg/m2  gen- obese male in NAD HEENT- no pallor, no  icterus, no LAD, MMM, no carotid bruits, left PERRLA, right prosthetic eye [facility]- normal s1,s2, regular rate and rhythm, no murmur, sternotomy scar respi- CTAB, no wheeze or rhonchi, no crackles abdo- bs+, soft, non tender, distended Ext- able to move all 4, no edema, distal pulses palpable Neuro- aao x3, no focal deficit, normal reflexes Psych- low mood, not tearful, normal thought process Skin- erythema on the skin on the face    Labs- Lab Results  Component Value Date   WBC 9.3 01/09/2013   HGB 13.5 01/09/2013   HCT 42.7 01/09/2013   MCV 86 01/09/2013   PLT 120* 07/06/2012   CMP     Component Value Date/Time   NA 142 01/09/2013 1712   NA 132* 07/06/2012 0500   K 5.1 01/09/2013 1712   CL 100 01/09/2013 1712   CO2 23 01/09/2013 1712   GLUCOSE 142* 01/09/2013 1712   GLUCOSE 196* 07/06/2012 0500   BUN 17 01/09/2013 1712   BUN 12 07/06/2012 0500   CREATININE 0.93 01/09/2013 1712   CALCIUM 9.9 01/09/2013 1712   PROT 7.2 01/09/2013 1712   PROT 7.5 06/25/2012 1057   ALBUMIN 3.7 06/25/2012 1057   AST 18 01/09/2013 1712   ALT 23 01/09/2013 1712   ALKPHOS 109 01/09/2013 1712   BILITOT <0.2 01/09/2013 1712   GFRNONAA 92 01/09/2013 1712   GFRAA 106 01/09/2013 1712    Lipid Panel     Component Value Date/Time   CHOL 165 06/26/2012 0355   TRIG 121 01/09/2013 1712   HDL 30* 01/09/2013 1712   HDL 30* 06/26/2012 0355   CHOLHDL 3.8 01/09/2013 1712   CHOLHDL 5.5 06/26/2012 0355   VLDL 49* 06/26/2012 0355   LDLCALC 61 01/09/2013 1712   LDLCALC 86 06/26/2012 0355   Lab Results  Component Value Date   HGBA1C 7.6* 01/09/2013    Assessment/Plan  1. Diabetes mellitus type II, uncontrolled Continue metformin 1000 mg bid for now and monitor cbg. Check a1c today. Continue lisinopril 5 mg daily for renoprotective effect. Continue statin and ASA. Goal a1c<7. uptodate with pneumococcal vaccine and influenza vaccine - Hemoglobin A1c - CMP; Future - Lipid Panel; Future - Hemoglobin A1c; Future -  CBC with Differential; Future  2. Erectile dysfunction associated with type 2 diabetes mellitus Continue viagra 100 mg daily as needed  3. Obstructive sleep apnea Continue cpap machine, exercise to be continued and congratulated on weight loss  4. CAD (coronary artery disease) Remains chest pain free. s/p CABG, continue statin, metoprolol, asa and plavix for now  5. Depression with anxiety  will continue sertraline 50 mg daily. Monitor clinically  6. Essential hypertension, benign Stable, continue metoprolol

## 2013-05-16 LAB — HEMOGLOBIN A1C
Est. average glucose Bld gHb Est-mCnc: 169 mg/dL
HEMOGLOBIN A1C: 7.5 % — AB (ref 4.8–5.6)

## 2013-05-22 ENCOUNTER — Ambulatory Visit: Payer: 59 | Admitting: Pulmonary Disease

## 2013-05-30 ENCOUNTER — Other Ambulatory Visit: Payer: Self-pay | Admitting: Internal Medicine

## 2013-06-11 ENCOUNTER — Ambulatory Visit: Payer: 59 | Admitting: Pulmonary Disease

## 2013-07-18 ENCOUNTER — Other Ambulatory Visit: Payer: 59

## 2013-07-18 ENCOUNTER — Encounter: Payer: Self-pay | Admitting: *Deleted

## 2013-07-18 DIAGNOSIS — E1165 Type 2 diabetes mellitus with hyperglycemia: Secondary | ICD-10-CM

## 2013-07-18 DIAGNOSIS — IMO0002 Reserved for concepts with insufficient information to code with codable children: Secondary | ICD-10-CM

## 2013-07-19 LAB — COMPREHENSIVE METABOLIC PANEL
A/G RATIO: 2 (ref 1.1–2.5)
ALT: 22 IU/L (ref 0–44)
AST: 19 IU/L (ref 0–40)
Albumin: 4 g/dL (ref 3.5–5.5)
Alkaline Phosphatase: 90 IU/L (ref 39–117)
BILIRUBIN TOTAL: 0.4 mg/dL (ref 0.0–1.2)
BUN/Creatinine Ratio: 18 (ref 9–20)
BUN: 17 mg/dL (ref 6–24)
CO2: 24 mmol/L (ref 18–29)
Calcium: 9.4 mg/dL (ref 8.7–10.2)
Chloride: 99 mmol/L (ref 97–108)
Creatinine, Ser: 0.92 mg/dL (ref 0.76–1.27)
GFR calc Af Amer: 107 mL/min/{1.73_m2} (ref >59)
GFR, EST NON AFRICAN AMERICAN: 93 mL/min/{1.73_m2} (ref >59)
Globulin, Total: 2 g/dL (ref 1.5–4.5)
Glucose: 166 mg/dL — ABNORMAL HIGH (ref 65–99)
POTASSIUM: 5.6 mmol/L — AB (ref 3.5–5.2)
SODIUM: 138 mmol/L (ref 134–144)
Total Protein: 6 g/dL (ref 6.0–8.5)

## 2013-07-19 LAB — CBC WITH DIFFERENTIAL/PLATELET
BASOS ABS: 0.1 10*3/uL (ref 0.0–0.2)
Basos: 1 %
Eos: 3 %
Eosinophils Absolute: 0.2 10*3/uL (ref 0.0–0.4)
HCT: 47.7 % (ref 37.5–51.0)
HEMOGLOBIN: 15.2 g/dL (ref 12.6–17.7)
IMMATURE GRANS (ABS): 0 10*3/uL (ref 0.0–0.1)
IMMATURE GRANULOCYTES: 0 %
Lymphocytes Absolute: 1.4 10*3/uL (ref 0.7–3.1)
Lymphs: 20 %
MCH: 29.3 pg (ref 26.6–33.0)
MCHC: 31.9 g/dL (ref 31.5–35.7)
MCV: 92 fL (ref 79–97)
MONOCYTES: 10 %
Monocytes Absolute: 0.7 10*3/uL (ref 0.1–0.9)
NEUTROS PCT: 66 %
Neutrophils Absolute: 4.7 10*3/uL (ref 1.4–7.0)
RBC: 5.19 x10E6/uL (ref 4.14–5.80)
RDW: 14.8 % (ref 12.3–15.4)
WBC: 7.1 10*3/uL (ref 3.4–10.8)

## 2013-07-19 LAB — HEMOGLOBIN A1C
ESTIMATED AVERAGE GLUCOSE: 177 mg/dL
Hgb A1c MFr Bld: 7.8 % — ABNORMAL HIGH (ref 4.8–5.6)

## 2013-07-19 LAB — LIPID PANEL
Chol/HDL Ratio: 3.2 ratio units (ref 0.0–5.0)
Cholesterol, Total: 129 mg/dL (ref 100–199)
HDL: 40 mg/dL (ref >39)
LDL CALC: 67 mg/dL (ref 0–99)
Triglycerides: 110 mg/dL (ref 0–149)
VLDL Cholesterol Cal: 22 mg/dL (ref 5–40)

## 2013-07-20 ENCOUNTER — Ambulatory Visit: Payer: 59 | Admitting: Pulmonary Disease

## 2013-07-24 ENCOUNTER — Telehealth: Payer: Self-pay

## 2013-07-24 DIAGNOSIS — E875 Hyperkalemia: Secondary | ICD-10-CM

## 2013-07-24 NOTE — Telephone Encounter (Signed)
Spoke with patient, patient will come in next week for recheck on potassium (future order placed) . Patient states he eats bananas frequently and will adjust to a low potassium diet. Copy of labs to be given at pending lab appointment or pending appointment with Dr.Pandey.

## 2013-07-24 NOTE — Telephone Encounter (Signed)
Message copied by Maurice Small on Tue Jul 24, 2013 12:02 PM ------      Message from: Oneal Grout      Created: Fri Jul 20, 2013 12:27 PM       Your potassium level is high. i would encourage you to drink fluids and avoid food rich in potassium like orange juice, canned food, nuts and banana for now. Recheck potassium level in a week. Also if you have any chest pain or racing of heart in between notify us. i will review your other labs in your OV ------

## 2013-08-01 ENCOUNTER — Other Ambulatory Visit: Payer: 59

## 2013-08-01 DIAGNOSIS — E875 Hyperkalemia: Secondary | ICD-10-CM

## 2013-08-02 LAB — BASIC METABOLIC PANEL
BUN/Creatinine Ratio: 19 (ref 9–20)
BUN: 19 mg/dL (ref 6–24)
CALCIUM: 9.4 mg/dL (ref 8.7–10.2)
CO2: 24 mmol/L (ref 18–29)
Chloride: 96 mmol/L — ABNORMAL LOW (ref 97–108)
Creatinine, Ser: 0.99 mg/dL (ref 0.76–1.27)
GFR calc Af Amer: 98 mL/min/{1.73_m2} (ref >59)
GFR calc non Af Amer: 85 mL/min/{1.73_m2} (ref >59)
GLUCOSE: 169 mg/dL — AB (ref 65–99)
POTASSIUM: 5.4 mmol/L — AB (ref 3.5–5.2)
SODIUM: 138 mmol/L (ref 134–144)

## 2013-08-03 ENCOUNTER — Encounter: Payer: Self-pay | Admitting: *Deleted

## 2013-08-08 ENCOUNTER — Other Ambulatory Visit: Payer: Self-pay | Admitting: Internal Medicine

## 2013-08-14 ENCOUNTER — Ambulatory Visit (INDEPENDENT_AMBULATORY_CARE_PROVIDER_SITE_OTHER): Payer: 59 | Admitting: Internal Medicine

## 2013-08-14 ENCOUNTER — Telehealth: Payer: Self-pay | Admitting: *Deleted

## 2013-08-14 ENCOUNTER — Encounter: Payer: Self-pay | Admitting: Internal Medicine

## 2013-08-14 VITALS — BP 126/78 | HR 59 | Temp 98.2°F | Wt 251.0 lb

## 2013-08-14 DIAGNOSIS — J069 Acute upper respiratory infection, unspecified: Secondary | ICD-10-CM

## 2013-08-14 DIAGNOSIS — E1165 Type 2 diabetes mellitus with hyperglycemia: Secondary | ICD-10-CM

## 2013-08-14 DIAGNOSIS — E875 Hyperkalemia: Secondary | ICD-10-CM

## 2013-08-14 DIAGNOSIS — E1169 Type 2 diabetes mellitus with other specified complication: Secondary | ICD-10-CM

## 2013-08-14 DIAGNOSIS — I10 Essential (primary) hypertension: Secondary | ICD-10-CM

## 2013-08-14 DIAGNOSIS — G4700 Insomnia, unspecified: Secondary | ICD-10-CM

## 2013-08-14 DIAGNOSIS — N529 Male erectile dysfunction, unspecified: Secondary | ICD-10-CM

## 2013-08-14 DIAGNOSIS — N521 Erectile dysfunction due to diseases classified elsewhere: Secondary | ICD-10-CM

## 2013-08-14 DIAGNOSIS — G4733 Obstructive sleep apnea (adult) (pediatric): Secondary | ICD-10-CM

## 2013-08-14 DIAGNOSIS — IMO0001 Reserved for inherently not codable concepts without codable children: Secondary | ICD-10-CM

## 2013-08-14 DIAGNOSIS — E785 Hyperlipidemia, unspecified: Secondary | ICD-10-CM

## 2013-08-14 DIAGNOSIS — IMO0002 Reserved for concepts with insufficient information to code with codable children: Secondary | ICD-10-CM

## 2013-08-14 MED ORDER — MELATONIN 5 MG PO TABS: 1.0000 | ORAL_TABLET | Freq: Every day | ORAL | Status: DC

## 2013-08-14 MED ORDER — AZITHROMYCIN 250 MG PO TABS: ORAL_TABLET | ORAL | Status: DC

## 2013-08-14 MED ORDER — GUAIFENESIN ER 600 MG PO TB12: 1200.0000 mg | ORAL_TABLET | Freq: Two times a day (BID) | ORAL | Status: DC

## 2013-08-14 MED ORDER — ALBUTEROL SULFATE (2.5 MG/3ML) 0.083% IN NEBU: 2.5000 mg | INHALATION_SOLUTION | Freq: Three times a day (TID) | RESPIRATORY_TRACT | Status: DC | PRN

## 2013-08-14 NOTE — Progress Notes (Signed)
Patient ID: Barry [name], male   DOB: 10/11/1955, 58 y.o.   MRN: [id number]    No Known Allergies  HPI 58 y/o male patient is here for his follow up.  He started with head cold last week. Over the weekend, he started feeling stuffed in his chest and nose. He has cough and has been coughing lately and bring up phlegm mostly yellow in color. Denies any blood in it. Cough interrupts his sleep and making it difficult to use his cpap at night. He feels sore in his chest from excess coughing. He has headache. Denies fever or chills at present. Feels tired and does not have much of appetite. Denies any nausea or vomiting. Has taken OTC tylenol cbg this am 146. Ranging between 130-150. No hypoglycemic episode.   He has noticed his heart to flutter at times. No chest pain or racing of the heart. Denies trouble breathing. He has gained few pounds since last visit.  Wt Readings from Last 3 Encounters:  08/14/13 251 lb (113.853 kg)  05/15/13 245 lb 3.2 oz (111.222 kg)  04/12/13 253 lb 3.2 oz (114.851 kg)   He haslbeen walking for exercise and eating healthy.  His mood is stable on zoloft 50 mg daily.  He has been taking viagra 100 mg daily and this is working for him  Has trouble falling asleep at night  Review of Systems     HENT: Negative for congestion  Eyes: Negative for blurred vision and double vision.        Prosthetic right eye   Respiratory: Negative for shortness of breath and wheezing.    Cardiovascular: Negative for chest pain, palpitations, orthopnea and PND.   Gastrointestinal: Negative for heartburn, nausea, vomiting, abdominal pain, diarrhea and constipation Genitourinary: had some burning with urination yesterday but resolved today. Denies dark colored urine or blood or flank pain   Musculoskeletal: back pain under better control. Negative for falls.   Skin: Negative for itching and rash.   Neurological: Negative for dizziness, seizures, loss of consciousness, weakness    Psychiatric/Behavioral: Negative for depression, suicidal ideas and memory loss. Has problem staying asleep  Past Medical History  Diagnosis Date  . Coronary artery disease   . MI (myocardial infarction) 04/23/2007    inferior wall  . Sleep apnea   . Morbid obesity 06/26/2012  . S/P CABG x 3 07/04/2012    LIMA to LAD, SVG to D1, SVG to PDA, EVH via right thigh  . Unspecified essential hypertension   . Type II or unspecified type diabetes mellitus without mention of complication, not stated as uncontrolled    Current Outpatient Prescriptions on File Prior to Visit  Medication Sig Dispense Refill  . aspirin 81 MG chewable tablet Chew 81 mg by mouth once.       Marland Kitchen atorvastatin (LIPITOR) 80 MG tablet TAKE 1 TABLET BYM OUTH ONCE DAILY AT 6PM  30 tablet  3  . clopidogrel (PLAVIX) 75 MG tablet take 1 tablet by mouth once daily  30 tablet  3  . glucose blood (ACCU-CHEK AVIVA PLUS) test strip Use as instructed  100 each  1  . lisinopril (PRINIVIL,ZESTRIL) 5 MG tablet Take 1 tablet (5 mg total) by mouth daily.  90 tablet  3  . metFORMIN (GLUCOPHAGE) 1000 MG tablet TAKE 1 TABLET BYMOUTH TWICE DAILY WITH MEALS  60 tablet  3  . metoprolol tartrate (LOPRESSOR) 25 MG tablet Take 0.5 tablets (12.5 mg total) by mouth 2 (two) times daily.  30 tablet  3  . sertraline (ZOLOFT) 50 MG tablet take 1 tablet by mouth once daily  30 tablet  0  . sildenafil (VIAGRA) 100 MG tablet take 1 tablet by mouth if needed  5 tablet  3   No current facility-administered medications on file prior to visit.   Physical exam BP 126/78  Pulse 59  Temp(Src) 98.2 F (36.8 C) (Oral)  Wt 251 lb (113.853 kg)  SpO2 98%  General- elderly male in no acute distress, obese Head- atraumatic, normocephalic Eyes- PERRLA, EOMI, no pallor, no icterus Neck- no lymphadenopathy, no thyromegaly, no jugular vein distension, no carotid bruit Chest- no chest wall tenderness Cardiovascular- normal s1,s2, no murmurs Nose- no sinus tenderness  but nasal mucosa swollen Oropharynx- oropharyngeal erythema noted, no exudates Respiratory- bilateral poor air entry, no wheeze, no rhonchi, no crackles Abdomen- bowel sounds present, soft, non tender Musculoskeletal- able to move all 4 extremities, no spinal and paraspinal tenderness, steady gait, no use of assistive device Neurological- no focal deficit, normal reflexes, normal muscle strength, normal sensation to fine touch and vibration Psychiatry- alert and oriented to person, place and time, normal mood and affect Skin- keloid on the sternotomy scar area   Lab Results  Component Value Date   HGBA1C 7.8* 07/18/2013   Lab Results  Component Value Date   CREATININE 0.99 08/01/2013   Lipid Panel     Component Value Date/Time   CHOL 165 06/26/2012 0355   TRIG 110 07/18/2013 0814   HDL 40 07/18/2013 0814   HDL 30* 06/26/2012 0355   CHOLHDL 3.2 07/18/2013 0814   CHOLHDL 5.5 06/26/2012 0355   VLDL 49* 06/26/2012 0355   LDLCALC 67 07/18/2013 0814   LDLCALC 86 06/26/2012 0355     Assessment/Plan  1. Diabetes mellitus type II, uncontrolled Reviewed a1c. Continue metformin 1000 mg bid for now and monitor cbg. Continue lisinopril 5 mg daily for renoprotective effect. Continue statin and ASA. Goal a1c<7. uptodate with pneumococcal vaccine and influenza vaccine. Normal foot exam today - Microalbumin/Creatinine Ratio, Urine - Hemoglobin A1c; Future  2. Essential hypertension, benign Stable , continue lisinopril and metoprolol  3. Acute upper respiratory infections of unspecified site Will have him started on zpack 5 days course, mucinex bid and prn albuterol for now. Reassess if no improvement  4. Other and unspecified hyperlipidemia Continue atorvastatin- ldl at goal, monitor clinically. Continue aspirin  5. Erectile dysfunction associated with type 2 diabetes mellitus Continue viagra 100 mg daily as needed  6. Obstructive sleep apnea Continue cpap machine  7. Hyperkalemia Recheck bmp,  avoid k rich food, lisinopril could be contributing some as well - Basic Metabolic Panel  8. Insomnia Will have him on melatonin and reassess, sleep hygiene and use of OSA encouraged as well with exercise

## 2013-08-14 NOTE — Telephone Encounter (Signed)
i am sorry, it should be for allbuterol handinhaler. pls send correct script. thanks

## 2013-08-14 NOTE — Telephone Encounter (Signed)
Patient called and stated that he was just seen and given Albuterol Nebulizer Medication instead of a Albuterol hand held inhaler. Patient stated that he does not have a Neb Machine to do the medication. Has done the Albuterol hand held before in the past. Please Advise.

## 2013-08-15 ENCOUNTER — Encounter: Payer: Self-pay | Admitting: *Deleted

## 2013-08-15 LAB — MICROALBUMIN / CREATININE URINE RATIO
CREATININE UR: 189.8 mg/dL (ref 22.0–328.0)
MICROALB/CREAT RATIO: 55.4 mg/g creat — ABNORMAL HIGH (ref 0.0–30.0)
Microalbumin, Urine: 105.1 ug/mL — ABNORMAL HIGH (ref 0.0–17.0)

## 2013-08-15 LAB — BASIC METABOLIC PANEL
BUN/Creatinine Ratio: 14 (ref 9–20)
BUN: 13 mg/dL (ref 6–24)
CO2: 26 mmol/L (ref 18–29)
Calcium: 9.2 mg/dL (ref 8.7–10.2)
Chloride: 103 mmol/L (ref 97–108)
Creatinine, Ser: 0.93 mg/dL (ref 0.76–1.27)
GFR, EST AFRICAN AMERICAN: 106 mL/min/{1.73_m2} (ref >59)
GFR, EST NON AFRICAN AMERICAN: 91 mL/min/{1.73_m2} (ref >59)
Glucose: 153 mg/dL — ABNORMAL HIGH (ref 65–99)
POTASSIUM: 5.3 mmol/L — AB (ref 3.5–5.2)
SODIUM: 144 mmol/L (ref 134–144)

## 2013-08-15 MED ORDER — ALBUTEROL SULFATE HFA 108 (90 BASE) MCG/ACT IN AERS: INHALATION_SPRAY | RESPIRATORY_TRACT | Status: DC

## 2013-08-15 NOTE — Telephone Encounter (Signed)
Patient Notified and faxed Rx to pharmacy. 

## 2013-09-04 ENCOUNTER — Encounter: Payer: Self-pay | Admitting: Internal Medicine

## 2013-09-04 ENCOUNTER — Ambulatory Visit (INDEPENDENT_AMBULATORY_CARE_PROVIDER_SITE_OTHER): Payer: 59 | Admitting: Internal Medicine

## 2013-09-04 VITALS — BP 130/78 | HR 63 | Temp 98.0°F | Ht 66.5 in | Wt 244.0 lb

## 2013-09-04 DIAGNOSIS — IMO0002 Reserved for concepts with insufficient information to code with codable children: Secondary | ICD-10-CM

## 2013-09-04 DIAGNOSIS — M5417 Radiculopathy, lumbosacral region: Secondary | ICD-10-CM | POA: Insufficient documentation

## 2013-09-04 MED ORDER — OXYCODONE-ACETAMINOPHEN 5-325 MG PO TABS: 1.0000 | ORAL_TABLET | Freq: Three times a day (TID) | ORAL | Status: DC | PRN

## 2013-09-04 NOTE — Progress Notes (Signed)
Patient ID: Barry [name], male   DOB: 1955/06/06, 58 y.o.   MRN: [id number]  Chief Complaint  Patient presents with  . 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 Denies recalling lifting heavy object recently Denies any trauma recently  ROS No chest pain or dyspnea Complaint with his other medications Has hx of HTN. CAD, DM  Past Medical History  Diagnosis Date  . Coronary artery disease   . MI (myocardial infarction) 04/23/2007    inferior wall  . Sleep apnea   . Morbid obesity 06/26/2012  . S/P CABG x 3 07/04/2012    LIMA to LAD, SVG to D1, SVG to PDA, EVH via right thigh  . Unspecified essential hypertension   . Type II or unspecified type diabetes mellitus without mention of complication, not stated as uncontrolled    Medication reviewed. See [facility]  Physical exam BP 130/78  Pulse 63  Temp(Src) 98 F (36.7 C) (Oral)  Ht 5' 6.5" (1.689 m)  Wt 244 lb (110.678 kg)  BMI 38.80 kg/m2  SpO2 98%  General- elderly male in no acute distress, obese Head- atraumatic, normocephalic Neck- no lymphadenopathy Cardiovascular- normal s1,s2, no murmurs Abdomen- bowel sounds present, soft, non tender Musculoskeletal- able to move all 4 extremities, no spinal and paraspinal tenderness, Limping and bearing more weight on right leg, no use of assistive device, strength 5/5 Neurological- no focal deficit, normal reflexes, normal muscle strength, normal sensation to fine touch and vibration Psychiatry- alert and oriented to person, place and time, normal mood and affect Skin- keloid on the sternotomy scar area  Assessment/plan  1. Lumbosacral radiculopathy With new onset left leg pain with  numbness and tingling, concern fo lumbosacral radiculopathy. Will have him on oxycodone-apap 5-325 mg 1 tab q8h prn for pain, common side effects explained, pt advised to take few days off work and rest, use ice pack prn for pain and reassess if no improvement. Pt to notify if pain worsens or pt notices any weakness in his extremities

## 2013-09-04 NOTE — Patient Instructions (Signed)

## 2013-09-13 ENCOUNTER — Other Ambulatory Visit: Payer: Self-pay | Admitting: *Deleted

## 2013-09-13 MED ORDER — OXYCODONE-ACETAMINOPHEN 5-325 MG PO TABS: 1.0000 | ORAL_TABLET | Freq: Three times a day (TID) | ORAL | Status: DC | PRN

## 2013-09-14 ENCOUNTER — Other Ambulatory Visit: Payer: Self-pay | Admitting: Internal Medicine

## 2013-09-18 ENCOUNTER — Ambulatory Visit (INDEPENDENT_AMBULATORY_CARE_PROVIDER_SITE_OTHER): Payer: 59 | Admitting: Internal Medicine

## 2013-09-18 ENCOUNTER — Encounter: Payer: Self-pay | Admitting: Internal Medicine

## 2013-09-18 VITALS — BP 120/76 | HR 73 | Temp 98.4°F | Wt 247.0 lb

## 2013-09-18 DIAGNOSIS — M1710 Unilateral primary osteoarthritis, unspecified knee: Secondary | ICD-10-CM

## 2013-09-18 DIAGNOSIS — M5417 Radiculopathy, lumbosacral region: Secondary | ICD-10-CM

## 2013-09-18 DIAGNOSIS — IMO0002 Reserved for concepts with insufficient information to code with codable children: Secondary | ICD-10-CM

## 2013-09-18 DIAGNOSIS — M179 Osteoarthritis of knee, unspecified: Secondary | ICD-10-CM | POA: Insufficient documentation

## 2013-09-18 DIAGNOSIS — M170 Bilateral primary osteoarthritis of knee: Secondary | ICD-10-CM

## 2013-09-18 MED ORDER — OXYCODONE-ACETAMINOPHEN 5-325 MG PO TABS
ORAL_TABLET | ORAL | Status: DC
Start: 2013-09-18 — End: 2013-09-18

## 2013-09-18 MED ORDER — METOPROLOL TARTRATE 25 MG PO TABS: 12.5000 mg | ORAL_TABLET | Freq: Two times a day (BID) | ORAL | Status: DC

## 2013-09-18 MED ORDER — OXYCODONE-ACETAMINOPHEN 5-325 MG PO TABS: ORAL_TABLET | ORAL | Status: DC

## 2013-09-18 NOTE — Progress Notes (Signed)
Patient ID: Barry [name], male   DOB: 08-18-1955, 58 y.o.   MRN: [id number]     No Known Allergies  Chief Complaint  Patient presents with  . Follow-up    2 week f/u leg/knee pain  . other    no falls  and needs doctor's note for work.   HPI 58 y/o male patient is here for follow up on his back pain and leg pain from last visit. He is now having pain in both his knees, morning stiffness which improves as day passes. The back pain that was there last visit has resolved now. No pain in the left leg either No numbness and tingling  He continues to work The pain medication has been helpful He was seeing Dr Thomasena Edis in [facility] for knee OA until several years back when he was lost to follow up.  ROS Complaint with his other medications Has hx of HTN. CAD, DM and is complaint with his medications No other concerns this visit  Past Medical History  Diagnosis Date  . Coronary artery disease   . MI (myocardial infarction) 04/23/2007    inferior wall  . Sleep apnea   . Morbid obesity 06/26/2012  . S/P CABG x 3 07/04/2012    LIMA to LAD, SVG to D1, SVG to PDA, EVH via right thigh  . Unspecified essential hypertension   . Type II or unspecified type diabetes mellitus without mention of complication, not stated as uncontrolled    Current Outpatient Prescriptions on File Prior to Visit  Medication Sig Dispense Refill  . albuterol (PROVENTIL HFA;VENTOLIN HFA) 108 (90 BASE) MCG/ACT inhaler One puff three times daily for wheezing or shortness of breath  1 Inhaler  2  . aspirin 81 MG chewable tablet Chew 81 mg by mouth once.       Marland Kitchen atorvastatin (LIPITOR) 80 MG tablet TAKE 1 TABLET BYM OUTH ONCE DAILY AT 6PM  30 tablet  3  . clopidogrel (PLAVIX) 75 MG tablet take 1 tablet by mouth once daily  30 tablet  3  . glucose blood (ACCU-CHEK AVIVA PLUS) test strip Use as instructed  100 each  1  . guaiFENesin (MUCINEX) 600 MG 12 hr tablet Take 2 tablets (1,200 mg total) by mouth 2 (two) times  daily.  20 tablet  0  . lisinopril (PRINIVIL,ZESTRIL) 5 MG tablet Take 1 tablet (5 mg total) by mouth daily.  90 tablet  3  . Melatonin 5 MG TABS Take 1 tablet (5 mg total) by mouth at bedtime.  30 tablet  3  . metFORMIN (GLUCOPHAGE) 1000 MG tablet TAKE 1 TABLET BYMOUTH TWICE DAILY WITH MEALS  60 tablet  3  . sertraline (ZOLOFT) 50 MG tablet take 1 tablet by mouth once daily  30 tablet  0  . sildenafil (VIAGRA) 100 MG tablet take 1 tablet by mouth if needed  5 tablet  3   No current facility-administered medications on file prior to visit.   Physical exam BP 120/76  Pulse 73  Temp(Src) 98.4 F (36.9 C) (Oral)  Wt 247 lb (112.038 kg)  SpO2 98%  General- elderly male in no acute distress, obese Cardiovascular- normal s1,s2, no murmurs Abdomen- bowel sounds present, soft, non tender Musculoskeletal- able to move all 4 extremities, no spinal and paraspinal tenderness, no use of assistive device, strength 5/5, crepitus in both knees, normal gait Neurological- no focal deficit Psychiatry- alert and oriented to person, place and time, normal mood and affect  Assessment/plan  1. Lumbosacral radiculopathy Improved pain. Continue prn roxicet- will change this to 5-325 2 tab in the am and one in the evening  2. Primary osteoarthritis of both knees - Ambulatory referral to Orthopedic Surgery. Continue prn pain meds for now

## 2013-10-30 ENCOUNTER — Other Ambulatory Visit: Payer: Self-pay | Admitting: Internal Medicine

## 2013-11-12 ENCOUNTER — Other Ambulatory Visit: Payer: 59

## 2013-11-12 ENCOUNTER — Encounter: Payer: Self-pay | Admitting: *Deleted

## 2013-11-12 DIAGNOSIS — E1165 Type 2 diabetes mellitus with hyperglycemia: Secondary | ICD-10-CM

## 2013-11-12 DIAGNOSIS — IMO0002 Reserved for concepts with insufficient information to code with codable children: Secondary | ICD-10-CM

## 2013-11-13 LAB — HEMOGLOBIN A1C
Est. average glucose Bld gHb Est-mCnc: 180 mg/dL
HEMOGLOBIN A1C: 7.9 % — AB (ref 4.8–5.6)

## 2013-11-14 ENCOUNTER — Ambulatory Visit: Payer: 59 | Admitting: Internal Medicine

## 2013-11-21 ENCOUNTER — Encounter: Payer: Self-pay | Admitting: *Deleted

## 2013-11-21 ENCOUNTER — Ambulatory Visit (INDEPENDENT_AMBULATORY_CARE_PROVIDER_SITE_OTHER): Payer: 59 | Admitting: Internal Medicine

## 2013-11-21 ENCOUNTER — Encounter: Payer: Self-pay | Admitting: Internal Medicine

## 2013-11-21 VITALS — BP 115/80 | HR 67 | Temp 98.1°F | Ht 66.0 in | Wt 249.2 lb

## 2013-11-21 DIAGNOSIS — Z23 Encounter for immunization: Secondary | ICD-10-CM

## 2013-11-21 DIAGNOSIS — E1169 Type 2 diabetes mellitus with other specified complication: Secondary | ICD-10-CM

## 2013-11-21 DIAGNOSIS — IMO0002 Reserved for concepts with insufficient information to code with codable children: Secondary | ICD-10-CM

## 2013-11-21 DIAGNOSIS — M5417 Radiculopathy, lumbosacral region: Secondary | ICD-10-CM

## 2013-11-21 DIAGNOSIS — I10 Essential (primary) hypertension: Secondary | ICD-10-CM

## 2013-11-21 DIAGNOSIS — Z1211 Encounter for screening for malignant neoplasm of colon: Secondary | ICD-10-CM

## 2013-11-21 DIAGNOSIS — N521 Erectile dysfunction due to diseases classified elsewhere: Secondary | ICD-10-CM

## 2013-11-21 DIAGNOSIS — G4700 Insomnia, unspecified: Secondary | ICD-10-CM

## 2013-11-21 DIAGNOSIS — E1129 Type 2 diabetes mellitus with other diabetic kidney complication: Secondary | ICD-10-CM

## 2013-11-21 DIAGNOSIS — E1165 Type 2 diabetes mellitus with hyperglycemia: Secondary | ICD-10-CM

## 2013-11-21 DIAGNOSIS — F418 Other specified anxiety disorders: Secondary | ICD-10-CM

## 2013-11-21 MED ORDER — SERTRALINE HCL 50 MG PO TABS: 75.0000 mg | ORAL_TABLET | Freq: Every day | ORAL | Status: DC

## 2013-11-21 MED ORDER — GLIPIZIDE 5 MG PO TABS: 5.0000 mg | ORAL_TABLET | Freq: Every day | ORAL | Status: DC

## 2013-11-21 MED ORDER — ZOLPIDEM TARTRATE 5 MG PO TABS: 5.0000 mg | ORAL_TABLET | Freq: Every evening | ORAL | Status: DC | PRN

## 2013-11-21 NOTE — Progress Notes (Signed)
Patient ID: Barry [name], male   DOB: 18-Dec-1955, 58 y.o.   MRN: [id number]    Chief Complaint  Patient presents with  . Medical Management of Chronic Issues    3 month follow up   No Known Allergies  HPI 58 y/o male patient is here for his follow up.  he has obesity, CAD, DM, erectile dysfunction, HTN among others. He has been feeling low lately.Having trouble falling asleep. Taking 2 of his prescribed melatonin without any help Feels low in terms of energy cbg in the am 120-130 range mostly, highest 140 Back pain better controlled with current pain medication- has been taking only one at night times for most of the days. Numbness and tingling under control. No focal weakness Has been snacking frequently in between meals. Has not been exercising  Wt Readings from Last 3 Encounters:  11/21/13 249 lb 3.2 oz (113.036 kg)  09/18/13 247 lb (112.038 kg)  09/04/13 244 lb (110.678 kg)   Review of Systems  Constitutional: Negative for fever, chills, diaphoresis.  HENT: Negative for congestion, hearing loss and sore throat.   Eyes: Negative for blurred vision, double vision and discharge.  Respiratory: Negative for cough, sputum production, shortness of breath and wheezing.   Cardiovascular: Negative for chest pain, palpitations, orthopnea and leg swelling.  Gastrointestinal: Negative for heartburn, nausea, vomiting, abdominal pain, diarrhea and constipation.  Genitourinary: Negative for dysuria, urgency, frequency and flank pain.  Musculoskeletal: Negative for falls.  Skin: Negative for itching and rash.  Neurological: Negative for dizziness, tingling, focal weakness and headaches.  Psychiatric/Behavioral: positive for depression and insomnia  Past Medical History  Diagnosis Date  . Coronary artery disease   . MI (myocardial infarction) 04/23/2007    inferior wall  . Sleep apnea   . Morbid obesity 06/26/2012  . S/P CABG x 3 07/04/2012    LIMA to LAD, SVG to D1, SVG to PDA, EVH via right  thigh  . Unspecified essential hypertension   . Type II or unspecified type diabetes mellitus without mention of complication, not stated as uncontrolled    Past Surgical History  Procedure Laterality Date  . Coronary angioplasty with stent placement  04/23/2007    PCI and stenting of mid RCA - Dr Bary Castilla @ [facility]  . Coronary artery bypass graft N/A 07/04/2012    Procedure: CORONARY ARTERY BYPASS GRAFTING (CABG);  Surgeon: [name], MD;  Location: [facility] OR;  Service: Open Heart Surgery;  Laterality: N/A;  x3 using right greater saphenous vein and left internal mammary.   . Intraoperative transesophageal echocardiogram N/A 07/04/2012    Procedure: INTRAOPERATIVE TRANSESOPHAGEAL ECHOCARDIOGRAM;  Surgeon: [name], MD;  Location: [facility] OR;  Service: Open Heart Surgery;  Laterality: N/A;   Current Outpatient Prescriptions on File Prior to Visit  Medication Sig Dispense Refill  . albuterol (PROVENTIL HFA;VENTOLIN HFA) 108 (90 BASE) MCG/ACT inhaler One puff three times daily for wheezing or shortness of breath  1 Inhaler  2  . aspirin 81 MG chewable tablet Chew 81 mg by mouth once.       Marland Kitchen atorvastatin (LIPITOR) 80 MG tablet take 1 tablet by mouth once daily  30 tablet  3  . clopidogrel (PLAVIX) 75 MG tablet take 1 tablet by mouth once daily  30 tablet  3  . glucose blood (ACCU-CHEK AVIVA PLUS) test strip Use as instructed  100 each  1  . guaiFENesin (MUCINEX) 600 MG 12 hr tablet Take 2 tablets (1,200 mg total) by mouth  2 (two) times daily.  20 tablet  0  . lisinopril (PRINIVIL,ZESTRIL) 5 MG tablet Take 1 tablet (5 mg total) by mouth daily.  90 tablet  3  . metFORMIN (GLUCOPHAGE) 1000 MG tablet take 1 tablet by mouth twice a day for diabetes  60 tablet  3  . metoprolol tartrate (LOPRESSOR) 25 MG tablet Take 0.5 tablets (12.5 mg total) by mouth 2 (two) times daily.  30 tablet  3  . oxyCODONE-acetaminophen (ROXICET) 5-325 MG per tablet Take 2 tablet in the morning and one tablet in the evening as  needed for pain  90 tablet  0  . VIAGRA 100 MG tablet take 1 tablet by mouth if needed  5 tablet  3   No current facility-administered medications on file prior to visit.   Physical exam BP 115/80  Pulse 67  Temp(Src) 98.1 F (36.7 C) (Oral)  Ht 5\' 6"  (1.676 m)  Wt 249 lb 3.2 oz (113.036 kg)  BMI 40.24 kg/m2  SpO2 96%  General- adult male in no acute distress, obese Head- atraumatic, normocephalic Eyes- PERRLA, EOMI, no pallor, no icterus Neck- no lymphadenopathy, no thyromegaly, no jugular vein distension, no carotid bruit Chest- no chest wall tenderness Cardiovascular- normal s1,s2, no murmurs Respiratory- CTAB, no wheeze, no rhonchi, no crackles Abdomen- bowel sounds present, soft, non tender Musculoskeletal- able to move all 4 extremities, no spinal and paraspinal tenderness, steady gait, no use of assistive device Neurological- no focal deficit, normal reflexes, normal muscle strength, normal sensation to fine touch and vibration Psychiatry- alert and oriented to person, place and time, normal mood and affect Skin- keloid on the sternotomy scar area  Labs  Lipid Panel     Component Value Date/Time   CHOL 165 06/26/2012 0355   TRIG 110 07/18/2013 0814   HDL 40 07/18/2013 0814   HDL 30* 06/26/2012 0355   CHOLHDL 3.2 07/18/2013 0814   CHOLHDL 5.5 06/26/2012 0355   VLDL 49* 06/26/2012 0355   LDLCALC 67 07/18/2013 0814   LDLCALC 86 06/26/2012 0355    Lab Results  Component Value Date   HGBA1C 7.9* 11/12/2013   08/14/13 urine microalbumin 105.1, ratio 55.4  Lab Results  Component Value Date   CREATININE 0.93 08/14/2013    Assessment/plan  1. Encounter for immunization Influenza vaccine provided  2. Essential hypertension, benign Continue lisinopril and lopressor current regimen - CBC with Differential; Future - CMP; Future - Lipid Panel; Future - TSH; Future - Hemoglobin A1c; Future  3. Erectile dysfunction associated with type 2 diabetes mellitus Continue viagra  prn, sample provided  4. Lumbosacral radiculopathy Improved pain control, continue prn roxicet for now  5. Colon cancer screening - Fecal occult blood, imunochemical; Future  6. Depression with anxiety Change zoloft to 75 mg daily and reassess  7. Insomnia D/c melatonin and add ambien 5 mg qhs prn for now. Sleep hygiene reinforced  8. Diabetes mellitus with renal manifestations, uncontrolled With a1c unchanged, his weight gain and sedentary lifestyle, will introduce glipizide 5 mg daily. Continue metformin 1000 mg bid. Monitor cbg. Common side effect explained. Has microalbuminuria, continue ACEI. Continue asa and lipitor. Patient advised to make eye appointment for retinal exam and agrees. Referral provided. Diet control and daily exercise encouraged - Ambulatory referral to Ophthalmology - Lipid Panel; Future - TSH; Future - Hemoglobin A1c; Future

## 2013-11-29 ENCOUNTER — Other Ambulatory Visit: Payer: 59

## 2013-11-29 DIAGNOSIS — Z1211 Encounter for screening for malignant neoplasm of colon: Secondary | ICD-10-CM

## 2013-11-30 LAB — FECAL OCCULT BLOOD, IMMUNOCHEMICAL: Fecal Occult Bld: NEGATIVE

## 2013-12-05 ENCOUNTER — Encounter: Payer: Self-pay | Admitting: *Deleted

## 2013-12-13 ENCOUNTER — Other Ambulatory Visit: Payer: Self-pay | Admitting: Internal Medicine

## 2014-01-03 ENCOUNTER — Encounter (HOSPITAL_COMMUNITY): Payer: Self-pay | Admitting: Cardiovascular Disease

## 2014-01-03 LAB — HM DIABETES EYE EXAM

## 2014-01-21 ENCOUNTER — Other Ambulatory Visit: Payer: Self-pay | Admitting: Internal Medicine

## 2014-01-22 ENCOUNTER — Encounter: Payer: Self-pay | Admitting: Internal Medicine

## 2014-01-29 ENCOUNTER — Other Ambulatory Visit: Payer: Self-pay | Admitting: Internal Medicine

## 2014-02-06 ENCOUNTER — Other Ambulatory Visit: Payer: Self-pay | Admitting: *Deleted

## 2014-02-06 MED ORDER — OXYCODONE-ACETAMINOPHEN 5-325 MG PO TABS: ORAL_TABLET | ORAL | Status: DC

## 2014-02-06 NOTE — Telephone Encounter (Signed)
Patient requested and will pick up 

## 2014-03-09 ENCOUNTER — Other Ambulatory Visit: Payer: Self-pay | Admitting: Internal Medicine

## 2014-03-21 ENCOUNTER — Other Ambulatory Visit: Payer: BLUE CROSS/BLUE SHIELD

## 2014-03-21 DIAGNOSIS — I10 Essential (primary) hypertension: Secondary | ICD-10-CM

## 2014-03-21 DIAGNOSIS — E1129 Type 2 diabetes mellitus with other diabetic kidney complication: Secondary | ICD-10-CM

## 2014-03-21 DIAGNOSIS — IMO0002 Reserved for concepts with insufficient information to code with codable children: Secondary | ICD-10-CM

## 2014-03-21 DIAGNOSIS — E1165 Type 2 diabetes mellitus with hyperglycemia: Secondary | ICD-10-CM

## 2014-03-22 LAB — LIPID PANEL
CHOL/HDL RATIO: 2.9 ratio (ref 0.0–5.0)
Cholesterol, Total: 120 mg/dL (ref 100–199)
HDL: 41 mg/dL (ref >39)
LDL Calculated: 61 mg/dL (ref 0–99)
TRIGLYCERIDES: 88 mg/dL (ref 0–149)
VLDL CHOLESTEROL CAL: 18 mg/dL (ref 5–40)

## 2014-03-22 LAB — COMPREHENSIVE METABOLIC PANEL
ALT: 25 IU/L (ref 0–44)
AST: 17 IU/L (ref 0–40)
Albumin/Globulin Ratio: 1.8 (ref 1.1–2.5)
Albumin: 4.1 g/dL (ref 3.5–5.5)
Alkaline Phosphatase: 92 IU/L (ref 39–117)
BUN/Creatinine Ratio: 21 — ABNORMAL HIGH (ref 9–20)
BUN: 18 mg/dL (ref 6–24)
Bilirubin Total: 0.2 mg/dL (ref 0.0–1.2)
CALCIUM: 9.1 mg/dL (ref 8.7–10.2)
CO2: 23 mmol/L (ref 18–29)
CREATININE: 0.85 mg/dL (ref 0.76–1.27)
Chloride: 103 mmol/L (ref 97–108)
GFR calc Af Amer: 113 mL/min/{1.73_m2} (ref >59)
GFR calc non Af Amer: 97 mL/min/{1.73_m2} (ref >59)
GLUCOSE: 135 mg/dL — AB (ref 65–99)
Globulin, Total: 2.3 g/dL (ref 1.5–4.5)
POTASSIUM: 4.9 mmol/L (ref 3.5–5.2)
Sodium: 141 mmol/L (ref 134–144)
Total Protein: 6.4 g/dL (ref 6.0–8.5)

## 2014-03-22 LAB — CBC WITH DIFFERENTIAL/PLATELET
BASOS: 1 %
Basophils Absolute: 0.1 10*3/uL (ref 0.0–0.2)
EOS: 3 %
Eosinophils Absolute: 0.1 10*3/uL (ref 0.0–0.4)
HCT: 43.7 % (ref 37.5–51.0)
HEMOGLOBIN: 14.3 g/dL (ref 12.6–17.7)
Immature Grans (Abs): 0 10*3/uL (ref 0.0–0.1)
Immature Granulocytes: 0 %
LYMPHS ABS: 1.9 10*3/uL (ref 0.7–3.1)
Lymphs: 34 %
MCH: 29.9 pg (ref 26.6–33.0)
MCHC: 32.7 g/dL (ref 31.5–35.7)
MCV: 91 fL (ref 79–97)
Monocytes Absolute: 0.5 10*3/uL (ref 0.1–0.9)
Monocytes: 9 %
NEUTROS ABS: 2.9 10*3/uL (ref 1.4–7.0)
NEUTROS PCT: 53 %
Platelets: 164 10*3/uL (ref 150–379)
RBC: 4.79 x10E6/uL (ref 4.14–5.80)
RDW: 13.9 % (ref 12.3–15.4)
WBC: 5.5 10*3/uL (ref 3.4–10.8)

## 2014-03-22 LAB — HEMOGLOBIN A1C
Est. average glucose Bld gHb Est-mCnc: 169 mg/dL
HEMOGLOBIN A1C: 7.5 % — AB (ref 4.8–5.6)

## 2014-03-22 LAB — TSH: TSH: 2.44 u[IU]/mL (ref 0.450–4.500)

## 2014-03-26 ENCOUNTER — Encounter: Payer: Self-pay | Admitting: Internal Medicine

## 2014-03-26 ENCOUNTER — Telehealth: Payer: Self-pay

## 2014-03-26 ENCOUNTER — Ambulatory Visit (INDEPENDENT_AMBULATORY_CARE_PROVIDER_SITE_OTHER): Payer: BLUE CROSS/BLUE SHIELD | Admitting: Internal Medicine

## 2014-03-26 VITALS — BP 118/78 | HR 62 | Temp 98.2°F | Ht 66.0 in | Wt 260.0 lb

## 2014-03-26 DIAGNOSIS — G4700 Insomnia, unspecified: Secondary | ICD-10-CM

## 2014-03-26 DIAGNOSIS — I10 Essential (primary) hypertension: Secondary | ICD-10-CM

## 2014-03-26 DIAGNOSIS — E1129 Type 2 diabetes mellitus with other diabetic kidney complication: Secondary | ICD-10-CM | POA: Diagnosis not present

## 2014-03-26 DIAGNOSIS — IMO0002 Reserved for concepts with insufficient information to code with codable children: Secondary | ICD-10-CM

## 2014-03-26 DIAGNOSIS — G4733 Obstructive sleep apnea (adult) (pediatric): Secondary | ICD-10-CM

## 2014-03-26 DIAGNOSIS — E785 Hyperlipidemia, unspecified: Secondary | ICD-10-CM | POA: Diagnosis not present

## 2014-03-26 DIAGNOSIS — R002 Palpitations: Secondary | ICD-10-CM

## 2014-03-26 DIAGNOSIS — F32A Depression, unspecified: Secondary | ICD-10-CM

## 2014-03-26 DIAGNOSIS — M5417 Radiculopathy, lumbosacral region: Secondary | ICD-10-CM

## 2014-03-26 DIAGNOSIS — R0600 Dyspnea, unspecified: Secondary | ICD-10-CM

## 2014-03-26 DIAGNOSIS — E1165 Type 2 diabetes mellitus with hyperglycemia: Secondary | ICD-10-CM

## 2014-03-26 DIAGNOSIS — K219 Gastro-esophageal reflux disease without esophagitis: Secondary | ICD-10-CM

## 2014-03-26 DIAGNOSIS — F329 Major depressive disorder, single episode, unspecified: Secondary | ICD-10-CM

## 2014-03-26 MED ORDER — OXYCODONE-ACETAMINOPHEN 5-325 MG PO TABS: ORAL_TABLET | ORAL | Status: DC

## 2014-03-26 MED ORDER — SERTRALINE HCL 100 MG PO TABS: 100.0000 mg | ORAL_TABLET | Freq: Every day | ORAL | Status: DC

## 2014-03-26 MED ORDER — METOPROLOL TARTRATE 25 MG PO TABS: 12.5000 mg | ORAL_TABLET | Freq: Two times a day (BID) | ORAL | Status: DC

## 2014-03-26 MED ORDER — OMEPRAZOLE 20 MG PO CPDR: DELAYED_RELEASE_CAPSULE | ORAL | Status: DC

## 2014-03-26 MED ORDER — LISINOPRIL 5 MG PO TABS: 5.0000 mg | ORAL_TABLET | Freq: Every day | ORAL | Status: DC

## 2014-03-26 MED ORDER — GLIPIZIDE 5 MG PO TABS: 5.0000 mg | ORAL_TABLET | Freq: Every day | ORAL | Status: DC

## 2014-03-26 MED ORDER — METFORMIN HCL 1000 MG PO TABS: ORAL_TABLET | ORAL | Status: DC

## 2014-03-26 MED ORDER — TETANUS-DIPHTH-ACELL PERTUSSIS 5-2.5-18.5 LF-MCG/0.5 IM SUSP: 0.5000 mL | Freq: Once | INTRAMUSCULAR | Status: DC

## 2014-03-26 MED ORDER — CLOPIDOGREL BISULFATE 75 MG PO TABS: 75.0000 mg | ORAL_TABLET | Freq: Every day | ORAL | Status: DC

## 2014-03-26 MED ORDER — ATORVASTATIN CALCIUM 80 MG PO TABS: 80.0000 mg | ORAL_TABLET | Freq: Every day | ORAL | Status: DC

## 2014-03-26 NOTE — Progress Notes (Signed)
Patient ID: Barry [name], male   DOB: 05-Apr-1955, 59 y.o.   MRN: [id number]    Chief Complaint  Patient presents with  . Medical Management of Chronic Issues    4 month follow-up, discuss labs (copy printed). Discuss bloos sugar (up/down) 195 today. Patient c/o chest pain, wheezing and heart flutters x couple months   . Immunizations    RX given for TDAP vaccine    No Known Allergies  HPI 59 y/o male patient is here for routine follow up.  Depression Since jan 2016 he is feeling low with increased stress at work and home. He has been taking his zoloft without much help.PHQ performed today- score 14  Heart flutter He started noticing his heart to flutter and having palpitation x 2 months now- on and off, lasts for couple of minutes and then self resolve. Denies any chest pain with the flutter. Denies dyspnea or gasping for breath with these flutter episodes. He has been getting short of breath with short distance though  DM cbg this am 170s cbgs fasting has been 135-190 with one reading of 55. There are days when he is skipping meals with his heartburn being bad or due to stress  OSA Has been on cpap but has not been regular using it. He feels that on days he uses cpap he sleeps somewhat better  Heartburn He has been having heartburn with chest discomfort for few months now. Chest discomfort is in the centre, non radiating, none at present. He feels bloated and has lost appetite. He has gained weight on review of his chart. No trauma or musculoskeletal injury recalled by patient Denies hemoptysis or hematemesis Wt Readings from Last 3 Encounters:  03/26/14 260 lb (117.935 kg)  11/21/13 249 lb 3.2 oz (113.036 kg)  09/18/13 247 lb (112.038 kg)   Compliant with his medication. Not following his diet or exercise regimen at present. Lost to follow up with his cardiologist Has noticed himself to be wheezing at night at times  Review of Systems     HENT: Negative for congestion   Eyes:  Negative for blurred vision and double vision. Prosthetic right eye   Respiratory: Negative for cough. No shortness of breath at rest    Cardiovascular: Negative for chest pain. Has orthopnea. denies PND but mentions feeling smothered at times at night Gastrointestinal: Negative for nausea, vomiting, abdominal pain, diarrhea and constipation Genitourinary: negative for dysuria Musculoskeletal: back pain under better control. Negative for falls.   Skin: Negative for itching and rash.   Neurological: Negative for dizziness, seizures, loss of consciousness. Psychiatric/Behavioral: Negative for memory loss. On depression and insomnia tretament  Past Medical History  Diagnosis Date  . Coronary artery disease   . MI (myocardial infarction) 04/23/2007    inferior wall  . Sleep apnea   . Morbid obesity 06/26/2012  . S/P CABG x 3 07/04/2012    LIMA to LAD, SVG to D1, SVG to PDA, EVH via right thigh  . Unspecified essential hypertension   . Type II or unspecified type diabetes mellitus without mention of complication, not stated as uncontrolled    Current Outpatient Prescriptions on File Prior to Visit  Medication Sig Dispense Refill  . ACCU-CHEK AVIVA PLUS test strip TEST three times a day 100 each 1  . albuterol (PROVENTIL HFA;VENTOLIN HFA) 108 (90 BASE) MCG/ACT inhaler One puff three times daily for wheezing or shortness of breath 1 Inhaler 2  . aspirin 81 MG chewable tablet Chew 81 mg by  mouth once.     Marland Kitchen atorvastatin (LIPITOR) 80 MG tablet take 1 tablet by mouth once daily 30 tablet 3  . clopidogrel (PLAVIX) 75 MG tablet take 1 tablet by mouth once daily 30 tablet 3  . glipiZIDE (GLUCOTROL) 5 MG tablet Take 1 tablet (5 mg total) by mouth daily before breakfast. 30 tablet 3  . lisinopril (PRINIVIL,ZESTRIL) 5 MG tablet take 1 tablet by mouth once daily 90 tablet 3  . metFORMIN (GLUCOPHAGE) 1000 MG tablet take 1 tablet by mouth twice a day for diabetes 60 tablet 3  . metoprolol tartrate  (LOPRESSOR) 25 MG tablet take 1/2 tablet by mouth twice a day 30 tablet 3  . sertraline (ZOLOFT) 50 MG tablet Take 1.5 tablets (75 mg total) by mouth at bedtime. 90 tablet 3  . VIAGRA 100 MG tablet take 1 tablet by mouth if needed 5 tablet 3  . zolpidem (AMBIEN) 5 MG tablet take 1 tablet by mouth at bedtime if needed for sleep 30 tablet 1   No current facility-administered medications on file prior to visit.    Physical exam BP 118/78 mmHg  Pulse 62  Temp(Src) 98.2 F (36.8 C) (Oral)  Ht  (1.676 m)  Wt 260 lb (117.935 kg)  BMI 41.99 kg/m2  SpO2 95%  General- elderly male in no acute distress, obese Head- atraumatic, normocephalic Neck- no lymphadenopathy, no jvd Cardiovascular- normal s1,s2, no murmurs Lungs- CTAB, no wheeze or rhonchi or crackles Abdomen- bowel sounds present, soft, non tender, distended, no epigastric tenderness Musculoskeletal- able to move all 4 extremities, no spinal and paraspinal tenderness, no use of assistive device, strength 5/5, no leg edema Neurological- no focal deficit Psychiatry- alert and oriented to person, place and time, feeling low during conversation with flat affect Skin- keloid on the sternotomy scar area  Labs  Lab Results  Component Value Date   HGBA1C 7.5* 03/21/2014   CMP Latest Ref Rng 03/21/2014 08/14/2013 08/01/2013  Glucose 65 - 99 mg/dL 621(H) 086(V) 784(O)  BUN 6 - 24 mg/dL Creatinine 0.76 - 1.27 mg/dL 9.62 9.52 8.41  Sodium 134 - 144 mmol/L 141 144 138  Potassium 3.5 - 5.2 mmol/L 4.9 5.3(H) 5.4(H)  Chloride 97 - 108 mmol/L 103 103 96(L)  CO2 18 - 29 mmol/L Calcium 8.7 - 10.2 mg/dL 9.1 9.2 9.4  Total Protein 6.0 - 8.5 g/dL 6.4 - -  Albumin 3.5 - 5.5 g/dL 4.1 - -  Total Bilirubin 0.0 - 1.2 mg/dL 0.2 - -  Alkaline Phos 39 - 117 IU/L 92 - -  AST 0 - 40 IU/L 17 - -  ALT 0 - 44 IU/L 25 - -   Lab Results  Component Value Date   TSH 2.440 03/21/2014   Lipid Panel     Component Value Date/Time    CHOL 120 03/21/2014 0756   CHOL 165 06/26/2012 0355   TRIG 88 03/21/2014 0756   HDL 41 03/21/2014 0756   HDL 30* 06/26/2012 0355   CHOLHDL 2.9 03/21/2014 0756   CHOLHDL 5.5 06/26/2012 0355   VLDL 49* 06/26/2012 0355   LDLCALC 61 03/21/2014 0756   LDLCALC 86 06/26/2012 0355   CBC    Component Value Date/Time   WBC 5.5 03/21/2014 0756   WBC 11.3* 07/06/2012 0500   RBC 4.79 03/21/2014 0756   RBC 3.96* 07/06/2012 0500   HGB 14.3 03/21/2014 0756   HCT 43.7 03/21/2014 0756   PLT 164 03/21/2014 0756  MCV 91 03/21/2014 0756   MCH 29.9 03/21/2014 0756   MCH 29.5 07/06/2012 0500   MCHC 32.7 03/21/2014 0756   MCHC 33.5 07/06/2012 0500   RDW 13.9 03/21/2014 0756   RDW 13.6 07/06/2012 0500   LYMPHSABS 1.9 03/21/2014 0756   EOSABS 0.1 03/21/2014 0756   BASOSABS 0.1 03/21/2014 0756     07/04/12 echocardiogram - Left ventricle: Systolic function was normal. The   estimated ejection fraction was in the range of 50% to   55%. - Atrial septum: There was a patent foramen ovale. Intraoperative transesophageal echocardiography  03/26/14 ekg- NSR, LAD, QTc 370, normal PR interval and normal QRS complex  Assessment/plan  1. Palpitations Sinus rhythm on ekg at present but again pt is asymptomatic this visit. Normal thyroid function. No anemia on lab review. His anxiety could be contributing some to this but will have him see cardiology to assess for need of holter monitor to evaluate further given his cardiac history and other risk factors. ehcocardiogram to be scheduled - EKG 12-Lead - 2D Echocardiogram without contrast; Future  2. Dyspnea His OSA, obesity could both be contributing to this. He has hx of CAD and i have concern for systolic CHF- Will get echocardiogram. Advised to use cpap. No signs of fluid overload on exam at present. Pt also advised to follow up with his cardiologist - 2D Echocardiogram without contrast; Future  3. Depression Increase zoloft to 100 mg daily,  recommended pt to see psychiatry given his current situation, counsellign sessions might prove to be helpful as well. Normal QTc on ekg - Ambulatory referral to Psychiatry  4. Essential hypertension, benign Stable, continue lopressor, lisinopril and monitor  5. Obstructive sleep apnea Encouraged to use CPAP machine, harms of not using it affecting both his lung and heart explained  6. Diabetes mellitus with renal manifestations, uncontrolled a1c 7.9 4 months back and 7.6 today. Reviewed cbg. Advised on avoiding to skip meals. Continue current regimen of metformin and glipizide for now. Continue lisinopril and lipitor. Continue aspirin. uptodate with foot, eye exam and urine microalbumin test  7. Lumbosacral radiculopathy Continue roxicet current regimen, it has been helpful, back precuations advised. Refill on script provided  8. Insomnia Continue ambien for now  9. Morbid obesity Continues to gait weight, not exercising or following diet regimen at present.   10. Gastroesophageal reflux disease, esophagitis presence not specified Start omeprazole 20 mg bid for a week, then once a day. Weight loss encouraged. Avoiding late night meals, cutting down on caffeine intake encouraged. If no improvement, consider gi referral  11. Hyperlipidemia LDL goal <70 At goal, continue lipitor 80 mg daily

## 2014-03-26 NOTE — Addendum Note (Signed)
Addended by: Maurice Small on: 03/26/2014 03:50 PM   Modules accepted: Orders

## 2014-03-26 NOTE — Telephone Encounter (Signed)
Called cardiology (per Dr.Pandey), scheduled appointment for Monday 04/01/14 with Dr.Berry at 3:30 pm. Patient to be seen for palpitations, SOB and history of CAD.  Patient aware of appointment date and time. Patient aware he needs to keep this appointment to avoid being hospitalized in the near future. Patient verbalized understanding.

## 2014-03-26 NOTE — Patient Instructions (Signed)
Stop viagra  Do not skip your meals while you are on diabetes medication  Use your cpap machine as advised

## 2014-03-29 ENCOUNTER — Ambulatory Visit (HOSPITAL_COMMUNITY): Payer: BLUE CROSS/BLUE SHIELD | Attending: Cardiology

## 2014-03-29 ENCOUNTER — Other Ambulatory Visit: Payer: Self-pay

## 2014-03-29 ENCOUNTER — Encounter (HOSPITAL_COMMUNITY): Payer: Self-pay

## 2014-03-29 DIAGNOSIS — I10 Essential (primary) hypertension: Secondary | ICD-10-CM | POA: Insufficient documentation

## 2014-03-29 DIAGNOSIS — E785 Hyperlipidemia, unspecified: Secondary | ICD-10-CM | POA: Diagnosis not present

## 2014-03-29 DIAGNOSIS — E119 Type 2 diabetes mellitus without complications: Secondary | ICD-10-CM | POA: Diagnosis not present

## 2014-03-29 DIAGNOSIS — I071 Rheumatic tricuspid insufficiency: Secondary | ICD-10-CM | POA: Insufficient documentation

## 2014-03-29 DIAGNOSIS — G4733 Obstructive sleep apnea (adult) (pediatric): Secondary | ICD-10-CM | POA: Insufficient documentation

## 2014-03-29 DIAGNOSIS — I214 Non-ST elevation (NSTEMI) myocardial infarction: Secondary | ICD-10-CM

## 2014-03-29 DIAGNOSIS — R0600 Dyspnea, unspecified: Secondary | ICD-10-CM | POA: Insufficient documentation

## 2014-03-29 DIAGNOSIS — G4700 Insomnia, unspecified: Secondary | ICD-10-CM | POA: Diagnosis not present

## 2014-03-29 DIAGNOSIS — I252 Old myocardial infarction: Secondary | ICD-10-CM | POA: Diagnosis not present

## 2014-03-29 DIAGNOSIS — I2510 Atherosclerotic heart disease of native coronary artery without angina pectoris: Secondary | ICD-10-CM | POA: Insufficient documentation

## 2014-03-29 DIAGNOSIS — R002 Palpitations: Secondary | ICD-10-CM

## 2014-03-29 MED ORDER — PERFLUTREN LIPID MICROSPHERE: 1.0000 mL | INTRAVENOUS | Status: DC | PRN

## 2014-03-29 MED ORDER — PERFLUTREN LIPID MICROSPHERE
1.0000 mL | INTRAVENOUS | Status: AC | PRN
  Administered 2014-03-29: 1 mL via INTRAVENOUS

## 2014-03-29 NOTE — Progress Notes (Signed)
2D Echo with Definity completed. 03/29/2014

## 2014-04-01 ENCOUNTER — Encounter: Payer: Self-pay | Admitting: Cardiovascular Disease

## 2014-04-01 ENCOUNTER — Ambulatory Visit (INDEPENDENT_AMBULATORY_CARE_PROVIDER_SITE_OTHER): Payer: BLUE CROSS/BLUE SHIELD | Admitting: Cardiovascular Disease

## 2014-04-01 VITALS — BP 142/70 | HR 68 | Ht 65.0 in | Wt 261.9 lb

## 2014-04-01 DIAGNOSIS — G4733 Obstructive sleep apnea (adult) (pediatric): Secondary | ICD-10-CM

## 2014-04-01 DIAGNOSIS — I10 Essential (primary) hypertension: Secondary | ICD-10-CM

## 2014-04-01 DIAGNOSIS — E785 Hyperlipidemia, unspecified: Secondary | ICD-10-CM

## 2014-04-01 DIAGNOSIS — Z951 Presence of aortocoronary bypass graft: Secondary | ICD-10-CM

## 2014-04-01 NOTE — Assessment & Plan Note (Signed)
History of hypertension blood pressure measured today at 142/70. He is on lisinopril 5 and metoprolol. Continue current meds at current dosing

## 2014-04-01 NOTE — Assessment & Plan Note (Signed)
History of CAD status post coronary artery bypass grafting by Dr. Cornelius Moras 07/04/12. He has done well since. He's had some atypical chest pain recently which sounds more like reflux improved with the addition of a proton pump inhibitor.

## 2014-04-01 NOTE — Assessment & Plan Note (Signed)
History of hyperlipidemia on atorvastatin 80 mg a day with his most recent lipid profile performed 03/21/14 revealing an LDL of 61 and HDL of 41

## 2014-04-01 NOTE — Progress Notes (Signed)
04/01/2014 Barry [name] Mable   03-13-1955  [id number]  Primary Physician Oneal Grout, MD Primary Cardiologist: [name] MD Roseanne Reno   HPI:   [id number] with history of CAD s/p MI about 5 years ago  I last saw him in the office 10/30/12. Marland Kitchen  He underwent coronary artery bypass grafting x3 by Dr. Ashley Mariner on 07/04/12 after his DAPT washed out. He was placed on Integrilin in the interim. He did well on his has recuperated nicely.  Since I saw him back in October 2014 he is done well until recently when he developed some atypical chest pain which time more like reflux. He also has some palpitations. This improved with the addition of a proton pump inhibitor.   Current Outpatient Prescriptions  Medication Sig Dispense Refill  . ACCU-CHEK AVIVA PLUS test strip TEST three times a day 100 each 1  . albuterol (PROVENTIL HFA;VENTOLIN HFA) 108 (90 BASE) MCG/ACT inhaler One puff three times daily for wheezing or shortness of breath 1 Inhaler 2  . aspirin 81 MG chewable tablet Chew 81 mg by mouth once.     Marland Kitchen atorvastatin (LIPITOR) 80 MG tablet Take 1 tablet (80 mg total) by mouth daily. 90 tablet 1  . clopidogrel (PLAVIX) 75 MG tablet Take 1 tablet (75 mg total) by mouth daily. 90 tablet 1  . glipiZIDE (GLUCOTROL) 5 MG tablet Take 1 tablet (5 mg total) by mouth daily before breakfast. 90 tablet 1  . lisinopril (PRINIVIL,ZESTRIL) 5 MG tablet Take 1 tablet (5 mg total) by mouth daily. 90 tablet 1  . metFORMIN (GLUCOPHAGE) 1000 MG tablet take 1 tablet by mouth twice a day for diabetes 180 tablet 1  . metoprolol tartrate (LOPRESSOR) 25 MG tablet Take 0.5 tablets (12.5 mg total) by mouth 2 (two) times daily. 90 tablet 1  . omeprazole (PRILOSEC) 20 MG capsule Take one tablet twice a day for 1 week and then one tablet daily in the morning empty stomach 45 capsule 3  . oxyCODONE-acetaminophen (ROXICET) 5-325 MG per tablet Take 2 tablet in the morning and one tablet in the evening as needed for pain 90  tablet 0  . sertraline (ZOLOFT) 100 MG tablet Take 1 tablet (100 mg total) by mouth at bedtime. 90 tablet 3  . Tdap (BOOSTRIX) 5-2.5-18.5 LF-MCG/0.5 injection Inject 0.5 mLs into the muscle once. 0.5 mL 0  . zolpidem (AMBIEN) 5 MG tablet take 1 tablet by mouth at bedtime if needed for sleep 30 tablet 1   No current facility-administered medications for this visit.    No Known Allergies  History   Social History  . Marital Status: Married    Spouse Name: N/A  . Number of Children: 1  . Years of Education: N/A   Occupational History  . MAINTENANCE    Social History Main Topics  . Smoking status: Never Smoker   . Smokeless tobacco: Never Used  . Alcohol Use: No  . Drug Use: No  . Sexual Activity: Not on file   Other Topics Concern  . Not on file   Social History Narrative     Review of Systems: General: negative for chills, fever, night sweats or weight changes.  Cardiovascular: negative for chest pain, dyspnea on exertion, edema, orthopnea, palpitations, paroxysmal nocturnal dyspnea or shortness of breath Dermatological: negative for rash Respiratory: negative for cough or wheezing Urologic: negative for hematuria Abdominal: negative for nausea, vomiting, diarrhea, bright red blood per rectum, melena, or hematemesis Neurologic: negative  for visual changes, syncope, or dizziness All other systems reviewed and are otherwise negative except as noted above.    Blood pressure 142/70, pulse 68, height  (1.651 m), weight 261 lb 14.4 oz (118.797 kg).  General appearance: alert and no distress Neck: no adenopathy, no carotid bruit, no JVD, supple, symmetrical, trachea midline and thyroid not enlarged, symmetric, no tenderness/mass/nodules Lungs: clear to auscultation bilaterally Heart: regular rate and rhythm, S1, S2 normal, no murmur, click, rub or gallop Extremities: extremities normal, atraumatic, no cyanosis or edema  EKG sinus bradycardia 59 without ST or T-wave  changes. I personally reviewed this EKG  ASSESSMENT AND PLAN:   S/P CABG x 3 History of CAD status post coronary artery bypass grafting by Dr. Cornelius Moras 07/04/12. He has done well since. He's had some atypical chest pain recently which sounds more like reflux improved with the addition of a proton pump inhibitor.   Obstructive sleep apnea On C Pap which he benefits from   Hyperlipidemia LDL goal <70 History of hyperlipidemia on atorvastatin 80 mg a day with his most recent lipid profile performed 03/21/14 revealing an LDL of 61 and HDL of 41   Essential hypertension, benign History of hypertension blood pressure measured today at 142/70. He is on lisinopril 5 and metoprolol. Continue current meds at current dosing       [name] MD [facility] 04/01/2014 3:58 PM

## 2014-04-01 NOTE — Assessment & Plan Note (Signed)
On C Pap which he benefits from

## 2014-04-01 NOTE — Patient Instructions (Signed)
Dr Berry recommends that you schedule a follow-up appointment in 1 year. You will receive a reminder letter in the mail two months in advance. If you don't receive a letter, please call our office to schedule the follow-up appointment. 

## 2014-04-02 ENCOUNTER — Telehealth: Payer: Self-pay | Admitting: Cardiovascular Disease

## 2014-04-02 NOTE — Telephone Encounter (Signed)
Barry [name] was calling to speak the [facility] about an EKG request that was done on 3/1. She says that EKG has now been sent to the scan center  Thanks

## 2014-04-02 NOTE — Telephone Encounter (Signed)
Per Misty Stanley at [facility], she was wanting to follow-up with [facility] about EKG done by Dr Glade Lloyd on 03/26/14.  Misty Stanley reports the EKG was done and was reviewed by Dr Sander Radon and then was sent off to be scanned through a center they use.  Per Misty Stanley she reports that the EKG has not crossed over into epic yet.  Will route this message to [facility] for further review and follow-up.

## 2014-04-02 NOTE — Telephone Encounter (Signed)
Noted. EKG was performed in office. Will review once it crosses over.

## 2014-04-07 ENCOUNTER — Other Ambulatory Visit: Payer: Self-pay | Admitting: Internal Medicine

## 2014-04-11 ENCOUNTER — Other Ambulatory Visit: Payer: Self-pay | Admitting: Internal Medicine

## 2014-04-11 NOTE — Telephone Encounter (Signed)
Can this encounter be closed?

## 2014-04-16 NOTE — Addendum Note (Signed)
Addended by: Barrie Dunker on: 04/16/2014 11:15 AM   Modules accepted: Orders

## 2014-04-17 ENCOUNTER — Ambulatory Visit: Payer: BLUE CROSS/BLUE SHIELD | Admitting: Internal Medicine

## 2014-05-08 ENCOUNTER — Ambulatory Visit (INDEPENDENT_AMBULATORY_CARE_PROVIDER_SITE_OTHER): Payer: BLUE CROSS/BLUE SHIELD | Admitting: Internal Medicine

## 2014-05-08 ENCOUNTER — Encounter: Payer: Self-pay | Admitting: Internal Medicine

## 2014-05-08 VITALS — BP 130/74 | HR 61 | Temp 98.2°F | Resp 20 | Ht 65.0 in | Wt 266.8 lb

## 2014-05-08 DIAGNOSIS — E1169 Type 2 diabetes mellitus with other specified complication: Secondary | ICD-10-CM

## 2014-05-08 DIAGNOSIS — E1129 Type 2 diabetes mellitus with other diabetic kidney complication: Secondary | ICD-10-CM

## 2014-05-08 DIAGNOSIS — F418 Other specified anxiety disorders: Secondary | ICD-10-CM | POA: Diagnosis not present

## 2014-05-08 DIAGNOSIS — N521 Erectile dysfunction due to diseases classified elsewhere: Secondary | ICD-10-CM | POA: Diagnosis not present

## 2014-05-08 DIAGNOSIS — K219 Gastro-esophageal reflux disease without esophagitis: Secondary | ICD-10-CM

## 2014-05-08 DIAGNOSIS — IMO0002 Reserved for concepts with insufficient information to code with codable children: Secondary | ICD-10-CM

## 2014-05-08 DIAGNOSIS — E1165 Type 2 diabetes mellitus with hyperglycemia: Secondary | ICD-10-CM | POA: Diagnosis not present

## 2014-05-08 DIAGNOSIS — Z125 Encounter for screening for malignant neoplasm of prostate: Secondary | ICD-10-CM

## 2014-05-08 MED ORDER — OXYCODONE-ACETAMINOPHEN 5-325 MG PO TABS: ORAL_TABLET | ORAL | Status: DC

## 2014-05-08 MED ORDER — SILDENAFIL CITRATE 100 MG PO TABS: ORAL_TABLET | ORAL | Status: DC

## 2014-05-08 NOTE — Progress Notes (Signed)
Patient ID: Barry [name], male   DOB: April 09, 1955, 59 y.o.   MRN: [id number]    Facility  [facility]    Place of Service:   OFFICE    No Known Allergies  Chief Complaint  Patient presents with  . Follow-up    6 week f/u depression and palpitations    HPI:  59 yo male seen today for f/u palpitations and depression/anxiety. Sertraline increased to 100mg  and depression improved. He saw cardiology and no med changes or procedures done. He was told atypical CP most likely due to reflux.he will f/u in 1 yr. He reports GI sx's improved on PPI. No further palpitations. Sleeping improved.   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 He munches at night. He does not monitor caloric intake.  BS improved to 150-160s. No low BS reactions.  He wears CPAP at night and is sleeping better with higher dose of sertraline.  He has ED and requests viagra Rx since his CP, SOB and palpitations have resolved  Past Medical History  Diagnosis Date  . Coronary artery disease   . MI (myocardial infarction) 04/23/2007    inferior wall  . Sleep apnea   . Morbid obesity 06/26/2012  . S/P CABG x 3 07/04/2012    LIMA to LAD, SVG to D1, SVG to PDA, EVH via right thigh  . Unspecified essential hypertension   . Type II or unspecified type diabetes mellitus without mention of complication, not stated as uncontrolled    Past Surgical History  Procedure Laterality Date  . Coronary angioplasty with stent placement  04/23/2007    PCI and stenting of mid RCA - Dr Bary Castilla @ [facility]  . Coronary artery bypass graft N/A 07/04/2012    Procedure: CORONARY ARTERY BYPASS GRAFTING (CABG);  Surgeon: [name], MD;  Location: [facility] OR;  Service: Open Heart Surgery;  Laterality: N/A;  x3 using right greater saphenous vein and left internal mammary.   . Intraoperative transesophageal echocardiogram N/A 07/04/2012    Procedure: INTRAOPERATIVE TRANSESOPHAGEAL ECHOCARDIOGRAM;  Surgeon: [name], MD;  Location: [facility] OR;   Service: Open Heart Surgery;  Laterality: N/A;  . Left heart catheterization with coronary angiogram N/A 06/25/2012    Procedure: LEFT HEART CATHETERIZATION WITH CORONARY ANGIOGRAM;  Surgeon: [name], MD;  Location: [facility] CATH LAB;  Service: Cardiovascular;  Laterality: N/A;   History   Social History  . Marital Status: Married    Spouse Name: N/A  . Number of Children: 1  . Years of Education: N/A   Occupational History  . MAINTENANCE    Social History Main Topics  . Smoking status: Never Smoker   . Smokeless tobacco: Never Used  . Alcohol Use: No  . Drug Use: No  . Sexual Activity: Not on file   Other Topics Concern  . None   Social History Narrative    Medications: Patient's Medications  New Prescriptions   No medications on file  Previous Medications   ACCU-CHEK AVIVA PLUS TEST STRIP    TEST three times a day   ALBUTEROL (PROVENTIL HFA;VENTOLIN HFA) 108 (90 BASE) MCG/ACT INHALER    One puff three times daily for wheezing or shortness of breath   ASPIRIN 81 MG CHEWABLE TABLET    Chew 81 mg by mouth once.    ATORVASTATIN (LIPITOR) 80 MG TABLET    Take 1 tablet (80 mg total) by mouth daily.   CLOPIDOGREL (PLAVIX) 75 MG TABLET  Take 1 tablet (75 mg total) by mouth daily.   GLIPIZIDE (GLUCOTROL) 5 MG TABLET    Take 1 tablet (5 mg total) by mouth daily before breakfast.   LISINOPRIL (PRINIVIL,ZESTRIL) 5 MG TABLET    Take 1 tablet (5 mg total) by mouth daily.   METFORMIN (GLUCOPHAGE) 1000 MG TABLET    take 1 tablet by mouth twice a day for diabetes   METOPROLOL TARTRATE (LOPRESSOR) 25 MG TABLET    Take 0.5 tablets (12.5 mg total) by mouth 2 (two) times daily.   OMEPRAZOLE (PRILOSEC) 20 MG CAPSULE    Take one tablet twice a day for 1 week and then one tablet daily in the morning empty stomach   SERTRALINE (ZOLOFT) 100 MG TABLET    Take 1 tablet (100 mg total) by mouth at bedtime.   TDAP (BOOSTRIX) 5-2.5-18.5 LF-MCG/0.5 INJECTION    Inject 0.5 mLs into the muscle once.    ZOLPIDEM (AMBIEN) 5 MG TABLET    take 1 tablet by mouth at bedtime if needed for sleep  Modified Medications   Modified Medication Previous Medication   OXYCODONE-ACETAMINOPHEN (ROXICET) 5-325 MG PER TABLET oxyCODONE-acetaminophen (ROXICET) 5-325 MG per tablet      Take 2 tablet in the morning and one tablet in the evening as needed for pain    Take 2 tablet in the morning and one tablet in the evening as needed for pain   SILDENAFIL (VIAGRA) 100 MG TABLET VIAGRA 100 MG tablet      Take 1 tab po daily prn    take 1 tablet by mouth if needed  Discontinued Medications   No medications on file     Review of Systems  Constitutional: Negative for chills, activity change and fatigue.  HENT: Negative for sore throat and trouble swallowing.   Eyes: Negative for visual disturbance.  Respiratory: Negative for cough, chest tightness and shortness of breath.   Cardiovascular: Negative for chest pain, palpitations and leg swelling.  Gastrointestinal: Negative for nausea, vomiting, abdominal pain and blood in stool.  Genitourinary: Negative for urgency, frequency and difficulty urinating.  Musculoskeletal: Negative for arthralgias and gait problem.  Skin: Negative for rash.  Neurological: Negative for weakness and headaches.  Psychiatric/Behavioral: Negative for confusion and sleep disturbance. The patient is not nervous/anxious.     Filed Vitals:   05/08/14 0824  BP: 130/74  Pulse: 61  Temp: 98.2 F (36.8 C)  TempSrc: Oral  Resp: 20  Height:  (1.651 m)  Weight: 266 lb 12.8 oz (121.02 kg)  SpO2: 95%   Body mass index is 44.4 kg/(m^2).  Physical Exam  Constitutional: He is oriented to person, place, and time. He appears well-developed and well-nourished. No distress.  Cardiovascular: Regular rhythm, normal heart sounds, intact distal pulses and normal pulses.  Bradycardia present.  Exam reveals no gallop and no friction rub.   No murmur heard. No distal LE swelling. No calf TTP.    Pulmonary/Chest: Effort normal and breath sounds normal. No respiratory distress. He has no wheezes. He has no rales. He exhibits no tenderness.  Neurological: He is alert and oriented to person, place, and time.  Skin: Skin is warm and dry. There is erythema (due to CPAP mask).  Psychiatric: He has a normal mood and affect. His behavior is normal. Judgment and thought content normal.     Labs reviewed: Appointment on 03/21/2014  Component Date Value Ref Range Status  . WBC 03/21/2014 5.5  3.4 - 10.8 x10E3/uL Final  .  RBC 03/21/2014 4.79  4.14 - 5.80 x10E6/uL Final  . Hemoglobin 03/21/2014 14.3  12.6 - 17.7 g/dL Final  . HCT 16/10/9602 43.7  37.5 - 51.0 % Final  . MCV 03/21/2014 91  79 - 97 fL Final  . MCH 03/21/2014 29.9  26.6 - 33.0 pg Final  . MCHC 03/21/2014 32.7  31.5 - 35.7 g/dL Final  . RDW 54/09/8117 13.9  12.3 - 15.4 % Final  . Platelets 03/21/2014 164  150 - 379 x10E3/uL Final  . Neutrophils Relative % 03/21/2014 53   Final  . Lymphs 03/21/2014 34   Final  . Monocytes 03/21/2014 9   Final  . Eos 03/21/2014 3   Final  . Basos 03/21/2014 1   Final  . Neutrophils Absolute 03/21/2014 2.9  1.4 - 7.0 x10E3/uL Final  . Lymphocytes Absolute 03/21/2014 1.9  0.7 - 3.1 x10E3/uL Final  . Monocytes Absolute 03/21/2014 0.5  0.1 - 0.9 x10E3/uL Final  . Eosinophils Absolute 03/21/2014 0.1  0.0 - 0.4 x10E3/uL Final  . Basophils Absolute 03/21/2014 0.1  0.0 - 0.2 x10E3/uL Final  . Immature Granulocytes 03/21/2014 0   Final  . Immature Grans (Abs) 03/21/2014 0.0  0.0 - 0.1 x10E3/uL Final  . Glucose 03/21/2014 135* 65 - 99 mg/dL Final  . BUN 14/78/2956 18  6 - 24 mg/dL Final  . Creatinine, Ser 03/21/2014 0.85  0.76 - 1.27 mg/dL Final  . GFR calc non Af Amer 03/21/2014 97  >59 mL/min/1.73 Final  . GFR calc Af Amer 03/21/2014 113  >59 mL/min/1.73 Final  . BUN/Creatinine Ratio 03/21/2014 21* 9 - 20 Final  . Sodium 03/21/2014 141  134 - 144 mmol/L Final  . Potassium 03/21/2014 4.9  3.5 -  5.2 mmol/L Final  . Chloride 03/21/2014 103  97 - 108 mmol/L Final  . CO2 03/21/2014 23  18 - 29 mmol/L Final  . Calcium 03/21/2014 9.1  8.7 - 10.2 mg/dL Final  . Total Protein 03/21/2014 6.4  6.0 - 8.5 g/dL Final  . Albumin 21/30/8657 4.1  3.5 - 5.5 g/dL Final  . Globulin, Total 03/21/2014 2.3  1.5 - 4.5 g/dL Final  . Albumin/Globulin Ratio 03/21/2014 1.8  1.1 - 2.5 Final  . Bilirubin Total 03/21/2014 0.2  0.0 - 1.2 mg/dL Final  . Alkaline Phosphatase 03/21/2014 92  39 - 117 IU/L Final  . AST 03/21/2014 17  0 - 40 IU/L Final  . ALT 03/21/2014 25  0 - 44 IU/L Final  . Cholesterol, Total 03/21/2014 120  100 - 199 mg/dL Final  . Triglycerides 03/21/2014 88  0 - 149 mg/dL Final  . HDL 84/69/6295 41  >39 mg/dL Final   Comment: According to ATP-III Guidelines, HDL-C >59 mg/dL is considered a negative risk factor for CHD.   Marland Kitchen VLDL Cholesterol Cal 03/21/2014 18  5 - 40 mg/dL Final  . LDL Calculated 03/21/2014 61  0 - 99 mg/dL Final  . Chol/HDL Ratio 03/21/2014 2.9  0.0 - 5.0 ratio units Final   Comment:                                   T. Chol/HDL Ratio                                             Men  Women                               1/2 Avg.Risk  3.4    3.3                                   Avg.Risk  5.0    4.4                                2X Avg.Risk  9.6    7.1                                3X Avg.Risk 23.4   11.0   . TSH 03/21/2014 2.440  0.450 - 4.500 uIU/mL Final  . Hgb A1c MFr Bld 03/21/2014 7.5* 4.8 - 5.6 % Final   Comment:          Pre-diabetes: 5.7 - 6.4          Diabetes: >6.4          Glycemic control for adults with diabetes: <7.0   . Est. average glucose Bld gHb Est-m* 03/21/2014 169   Final     Assessment/Plan    ICD-9-CM ICD-10-CM   1. Depression with anxiety - improved on sertraline 300.4 F41.8   2. Morbid obesity - worsening; gained 6 lbs since last OV 278.01 E66.01   3. Diabetes mellitus with renal manifestations, uncontrolled - improved hyperglycemia  250.42 E11.29 CMP    E11.65 Lipid Panel     Hemoglobin A1c     Urinalysis with Reflex Microscopic     Microalbumin/Creatinine Ratio, Urine  4. Gastroesophageal reflux disease, esophagitis presence not specified - cont PPI; stable 530.81 K21.9   5. Prostate cancer screening V76.44 Z12.5 PSA  6. Erectile dysfunction associated with type 2 diabetes mellitus 250.80 E11.69    607.84 N52.1    --resume viagra. He is aware of ADRs  --CPE w fasting labs prior in 2 mos  --discussed diet and exercise. Provided education material  --continue other meds as ordered   Nenana S. Ancil Linsey  [facility] [address] [place], [place] [address] [phone] Office (Wednesdays and Fridays 8 AM - 5 PM) [phone] Cell (Monday-Friday 8 AM - 5 PM)

## 2014-05-08 NOTE — Patient Instructions (Addendum)
May resume viagra as tolerated.  Reduce calories in diet. Continue exercise as tolerated.  Follow up in 2 mos for CPE.    Fat and Cholesterol Control Diet Fat and cholesterol levels in your blood and organs are influenced by your diet. High levels of fat and cholesterol may lead to diseases of the heart, small and large blood vessels, gallbladder, liver, and pancreas. CONTROLLING FAT AND CHOLESTEROL WITH DIET Although exercise and lifestyle factors are important, your diet is key. That is because certain foods are known to raise cholesterol and others to lower it. The goal is to balance foods for their effect on cholesterol and more importantly, to replace saturated and trans fat with other types of fat, such as monounsaturated fat, polyunsaturated fat, and omega-3 fatty acids. On average, a person should consume no more than 15 to 17 g of saturated fat daily. Saturated and trans fats are considered "bad" fats, and they will raise LDL cholesterol. Saturated fats are primarily found in animal products such as meats, butter, and cream. However, that does not mean you need to give up all your favorite foods. Today, there are good tasting, low-fat, low-cholesterol substitutes for most of the things you like to eat. Choose low-fat or nonfat alternatives. Choose round or loin cuts of red meat. These types of cuts are lowest in fat and cholesterol. Chicken (without the skin), fish, veal, and ground [place] breast are great choices. Eliminate fatty meats, such as hot dogs and salami. Even shellfish have little or no saturated fat. Have a 3 oz (85 g) portion when you eat lean meat, poultry, or fish. Trans fats are also called "partially hydrogenated oils." They are oils that have been scientifically manipulated so that they are solid at room temperature resulting in a longer shelf life and improved taste and texture of foods in which they are added. Trans fats are found in stick margarine, some tub margarines,  cookies, crackers, and baked goods.  When baking and cooking, oils are a great substitute for butter. The monounsaturated oils are especially beneficial since it is believed they lower LDL and raise HDL. The oils you should avoid entirely are saturated tropical oils, such as coconut and palm.  Remember to eat a lot from food groups that are naturally free of saturated and trans fat, including fish, fruit, vegetables, beans, grains (barley, rice, couscous, bulgur wheat), and pasta (without cream sauces).  IDENTIFYING FOODS THAT LOWER FAT AND CHOLESTEROL  Soluble fiber may lower your cholesterol. This type of fiber is found in fruits such as apples, vegetables such as broccoli, potatoes, and carrots, legumes such as beans, peas, and lentils, and grains such as barley. Foods fortified with plant sterols (phytosterol) may also lower cholesterol. You should eat at least 2 g per day of these foods for a cholesterol lowering effect.  Read package labels to identify low-saturated fats, trans fat free, and low-fat foods at the supermarket. Select cheeses that have only 2 to 3 g saturated fat per ounce. Use a heart-healthy tub margarine that is free of trans fats or partially hydrogenated oil. When buying baked goods (cookies, crackers), avoid partially hydrogenated oils. Breads and muffins should be made from whole grains (whole-wheat or whole oat flour, instead of "flour" or "enriched flour"). Buy non-creamy canned soups with reduced salt and no added fats.  FOOD PREPARATION TECHNIQUES  Never deep-fry. If you must fry, either stir-fry, which uses very little fat, or use non-stick cooking sprays. When possible, broil, bake, or roast meats,  and steam vegetables. Instead of putting butter or margarine on vegetables, use lemon and herbs, applesauce, and cinnamon (for squash and sweet potatoes). Use nonfat yogurt, salsa, and low-fat dressings for salads.  LOW-SATURATED FAT / LOW-FAT FOOD SUBSTITUTES Meats / Saturated  Fat (g)  Avoid: Steak, marbled (3 oz/85 g) / 11 g  Choose: Steak, lean (3 oz/85 g) / 4 g  Avoid: Hamburger (3 oz/85 g) / 7 g  Choose: Hamburger, lean (3 oz/85 g) / 5 g  Avoid: Ham (3 oz/85 g) / 6 g  Choose: Ham, lean cut (3 oz/85 g) / 2.4 g  Avoid: Chicken, with skin, dark meat (3 oz/85 g) / 4 g  Choose: Chicken, skin removed, dark meat (3 oz/85 g) / 2 g  Avoid: Chicken, with skin, light meat (3 oz/85 g) / 2.5 g  Choose: Chicken, skin removed, light meat (3 oz/85 g) / 1 g Dairy / Saturated Fat (g)  Avoid: Whole milk (1 cup) / 5 g  Choose: Low-fat milk, 2% (1 cup) / 3 g  Choose: Low-fat milk, 1% (1 cup) / 1.5 g  Choose: Skim milk (1 cup) / 0.3 g  Avoid: Hard cheese (1 oz/28 g) / 6 g  Choose: Skim milk cheese (1 oz/28 g) / 2 to 3 g  Avoid: Cottage cheese, 4% fat (1 cup) / 6.5 g  Choose: Low-fat cottage cheese, 1% fat (1 cup) / 1.5 g  Avoid: Ice cream (1 cup) / 9 g  Choose: Sherbet (1 cup) / 2.5 g  Choose: Nonfat frozen yogurt (1 cup) / 0.3 g  Choose: Frozen fruit bar / trace  Avoid: Whipped cream (1 tbs) / 3.5 g  Choose: Nondairy whipped topping (1 tbs) / 1 g Condiments / Saturated Fat (g)  Avoid: Mayonnaise (1 tbs) / 2 g  Choose: Low-fat mayonnaise (1 tbs) / 1 g  Avoid: Butter (1 tbs) / 7 g  Choose: Extra light margarine (1 tbs) / 1 g  Avoid: Coconut oil (1 tbs) / 11.8 g  Choose: Olive oil (1 tbs) / 1.8 g  Choose: Corn oil (1 tbs) / 1.7 g  Choose: Safflower oil (1 tbs) / 1.2 g  Choose: Sunflower oil (1 tbs) / 1.4 g  Choose: Soybean oil (1 tbs) / 2.4 g  Choose: Canola oil (1 tbs) / 1 g Document Released: 01/11/2005 Document Revised: 05/08/2012 Document Reviewed: 04/11/2013 ExitCare Patient Information 2015 [place], [place]. This information is not intended to replace advice given to you by your health care provider. Make sure you discuss any questions you have with your health care provider.

## 2014-07-12 ENCOUNTER — Encounter: Payer: Self-pay | Admitting: Internal Medicine

## 2014-07-12 ENCOUNTER — Ambulatory Visit (INDEPENDENT_AMBULATORY_CARE_PROVIDER_SITE_OTHER): Payer: BLUE CROSS/BLUE SHIELD | Admitting: Internal Medicine

## 2014-07-12 VITALS — BP 110/78 | HR 58 | Temp 97.9°F | Resp 18 | Ht 65.0 in | Wt 269.6 lb

## 2014-07-12 DIAGNOSIS — E1129 Type 2 diabetes mellitus with other diabetic kidney complication: Secondary | ICD-10-CM | POA: Diagnosis not present

## 2014-07-12 DIAGNOSIS — G4700 Insomnia, unspecified: Secondary | ICD-10-CM | POA: Diagnosis not present

## 2014-07-12 DIAGNOSIS — G4733 Obstructive sleep apnea (adult) (pediatric): Secondary | ICD-10-CM | POA: Diagnosis not present

## 2014-07-12 DIAGNOSIS — Z0000 Encounter for general adult medical examination without abnormal findings: Secondary | ICD-10-CM

## 2014-07-12 DIAGNOSIS — E785 Hyperlipidemia, unspecified: Secondary | ICD-10-CM

## 2014-07-12 DIAGNOSIS — IMO0002 Reserved for concepts with insufficient information to code with codable children: Secondary | ICD-10-CM

## 2014-07-12 DIAGNOSIS — E1165 Type 2 diabetes mellitus with hyperglycemia: Secondary | ICD-10-CM

## 2014-07-12 DIAGNOSIS — M5417 Radiculopathy, lumbosacral region: Secondary | ICD-10-CM

## 2014-07-12 DIAGNOSIS — I10 Essential (primary) hypertension: Secondary | ICD-10-CM

## 2014-07-12 DIAGNOSIS — F418 Other specified anxiety disorders: Secondary | ICD-10-CM | POA: Diagnosis not present

## 2014-07-12 MED ORDER — OXYCODONE-ACETAMINOPHEN 5-325 MG PO TABS: ORAL_TABLET | ORAL | Status: DC

## 2014-07-12 MED ORDER — ZOLPIDEM TARTRATE 10 MG PO TABS: 10.0000 mg | ORAL_TABLET | Freq: Every evening | ORAL | Status: DC | PRN

## 2014-07-12 NOTE — Progress Notes (Signed)
Patient ID: Barry [name], male   DOB: 12/22/1955, 59 y.o.   MRN: [id number] :     Barry [name] is a 59 y.o. male and is here for a comprehensive physical exam. The patient reports problems - personal issues. Divorce will be final this month. Not really exercising. he has an exercise bike but has not had an opportunity to use it. He is making poor food choices.  BS 140-160s at home. No low BS reactions. No numbness/tingling. He is taking metformin and glipizide  Pain is 5-6/10 on scale in back. Pain worsens with prolonged walking and stair climbing but improves with rest  Mood is depressed. He takes sertraline. Zolpidem  not helping him to sleep.  BP stable on lopressor and lisinopril  He takes lipitor for cholesterol and plavix/ASA for heart disease.  He uses CPAP at night for OSA  Past Medical History  Diagnosis Date  . Coronary artery disease   . MI (myocardial infarction) 04/23/2007    inferior wall  . Sleep apnea   . Morbid obesity 06/26/2012  . S/P CABG x 3 07/04/2012    LIMA to LAD, SVG to D1, SVG to PDA, EVH via right thigh  . Unspecified essential hypertension   . Type II or unspecified type diabetes mellitus without mention of complication, not stated as uncontrolled    Past Surgical History  Procedure Laterality Date  . Coronary angioplasty with stent placement  04/23/2007    PCI and stenting of mid RCA - Dr Bary Castilla @ [facility]  . Coronary artery bypass graft N/A 07/04/2012    Procedure: CORONARY ARTERY BYPASS GRAFTING (CABG);  Surgeon: [name], MD;  Location: [facility] OR;  Service: Open Heart Surgery;  Laterality: N/A;  x3 using right greater saphenous vein and left internal mammary.   . Intraoperative transesophageal echocardiogram N/A 07/04/2012    Procedure: INTRAOPERATIVE TRANSESOPHAGEAL ECHOCARDIOGRAM;  Surgeon: [name], MD;  Location[facility] OR;  Service: Open Heart Surgery;  Laterality: N/A;  . Left heart catheterization with coronary angiogram N/A 06/25/2012    Procedure: LEFT HEART CATHETERIZATION WITH CORONARY ANGIOGRAM;  Surgeon: [name], MD;  Location[facility] LAB;  Service: Cardiovascular;  Laterality: N/A;   Family History  Problem Relation Age of Onset  . Cancer Mother   . Diabetes Sister   . Diabetes Brother      History   Social History  . Marital Status: Married    Spouse Name: N/A  . Number of Children: 1  . Years of Education: N/A   Occupational History  . MAINTENANCE    Social History Main Topics  . Smoking status: Never Smoker   . Smokeless tobacco: Never Used  . Alcohol Use: No  . Drug Use: No  . Sexual Activity: Not on file   Other Topics Concern  . Not on file   Social History Narrative   Health Maintenance  Topic Date Due  . HIV Screening  04/27/1972  . FOOT EXAM  08/15/2014  . URINE MICROALBUMIN  08/15/2014  . INFLUENZA VACCINE  08/26/2014  . HEMOGLOBIN A1C  09/19/2014  . OPHTHALMOLOGY EXAM  01/04/2015  . PNEUMOCOCCAL POLYSACCHARIDE VACCINE (2) 09/05/2017  . COLONOSCOPY  11/22/2023  . TETANUS/TDAP  05/07/2024    Review of Systems A comprehensive review of systems was negative.   Objective:     Physical Exam  Constitutional: He is oriented to person, place, and time and well-developed, well-nourished, and in no distress.  HENT:  Head: Normocephalic  and atraumatic.  Right Ear: Hearing, tympanic membrane, external ear and ear canal normal.  Left Ear: Hearing, tympanic membrane, external ear and ear canal normal.  Mouth/Throat: Uvula is midline, oropharynx is clear and moist and mucous membranes are normal.  Eyes: Conjunctivae, EOM and lids are normal. Right eye exhibits no discharge. No scleral icterus.  Neck: Trachea normal. Neck supple. Carotid bruit is not present. No tracheal deviation present. No thyroid mass and no thyromegaly present.  Cardiovascular: Normal rate, regular rhythm, normal heart sounds and intact distal pulses.  Exam reveals no gallop and no friction rub.   No murmur  heard. Pulmonary/Chest: Effort normal and breath sounds normal. No stridor. No respiratory distress. He has no wheezes. He has no rhonchi. He has no rales. He exhibits no mass, no tenderness and no crepitus. Right breast exhibits no inverted nipple, no mass, no nipple discharge, no skin change and no tenderness. Left breast exhibits no inverted nipple, no mass, no nipple discharge, no skin change and no tenderness. Breasts are symmetrical.  Abdominal: Soft. Normal appearance, normal aorta and bowel sounds are normal. He exhibits no abdominal bruit, no ascites, no pulsatile midline mass and no mass. There is no hepatosplenomegaly. There is no tenderness. There is no rebound. No hernia.  Genitourinary: Testes/scrotum normal and penis normal. No discharge found.  Rectal deferred. He had a colonoscopy in Oct 2015  Musculoskeletal: He exhibits edema and tenderness.       Lumbar back: He exhibits decreased range of motion, tenderness and spasm.       Back:  Lymphadenopathy:       Head (right side): No submandibular and no posterior auricular adenopathy present.       Head (left side): No submandibular and no posterior auricular adenopathy present.    He has no cervical adenopathy.       Right: No supraclavicular adenopathy present.       Left: No supraclavicular adenopathy present.  Neurological: He is alert and oriented to person, place, and time. He has normal motor skills, normal strength and normal reflexes.  Antalgic gait  Skin: Skin is warm, dry and intact. No rash noted. There is erythema (facial).  Psychiatric: Memory, affect and judgment normal. He exhibits a depressed mood.   Diabetic Foot Exam - Simple   Simple Foot Form  Diabetic Foot exam was performed with the following findings:  Yes 07/12/2014 10:52 AM  Visual Inspection  See comments:  Yes  Sensation Testing  Intact to touch and monofilament testing bilaterally:  Yes  Pulse Check  Posterior Tibialis and Dorsalis pulse intact  bilaterally:  Yes  Comments  Heel callus b/l but no ulceration        Assessment:    Healthy male exam.       ICD-9-CM ICD-10-CM   1. Well adult exam V70.0 Z00.00   2. Insomnia - suboptimally controlled 780.52 G47.00   3. Depression with anxiety - stable 300.4 F41.8   4. Diabetes mellitus with renal manifestations, uncontrolled - stable 250.42 E11.29     E11.65   5. Morbid obesity - unchanged 278.01 E66.01   6. Essential hypertension, benign - stable 401.1 I10   7. Lumbosacral radiculopathy - stable 724.4 M54.17   8. Obstructive sleep apnea - on CPAP qhs 327.23 G47.33   9. Hyperlipidemia LDL goal <70 - on statin 272.4 E78.5     Plan:    See After Visit Summary for Counseling Recommendations    Pt is UTD on health maintenance.  Vaccinations are UTD. Pt maintains a healthy lifestyle. Encouraged pt to exercise 30-45 minutes 4-5 times per week. Eat a well balanced diet. Avoid smoking. Limit alcohol intake. Wear seatbelt when riding in the car. Wear sun block (SPF >50) when spending extended times outside.  --increase zolpidem  qhs. New Rx sent to pharmacy  --continue other meds as ordered. New Rx for roxicet sent to pharmacy  --f/u in 3-4 mos for routine visit. RTO for fasting labs  [place] S. Ancil Linsey  [facility] [address] [place], [place] [address] [phone] Cell (Monday-Friday 8 AM - 5 PM) [phone] After 5 PM and follow prompts

## 2014-07-12 NOTE — Patient Instructions (Signed)
Encouraged him to exercise 30-45 minutes 4-5 times per week. Eat a well balanced diet. Avoid smoking. Limit alcohol intake. Wear seatbelt when riding in the car. Wear sun block (SPF >50) when spending extended times outside.  Continue current medications as ordered  Return to office for fasting labs. Will call with lab results  Follow up in 3-4 mos for routine visit

## 2014-07-16 ENCOUNTER — Other Ambulatory Visit: Payer: BLUE CROSS/BLUE SHIELD

## 2014-07-16 DIAGNOSIS — IMO0002 Reserved for concepts with insufficient information to code with codable children: Secondary | ICD-10-CM

## 2014-07-16 DIAGNOSIS — E1165 Type 2 diabetes mellitus with hyperglycemia: Principal | ICD-10-CM

## 2014-07-16 DIAGNOSIS — E1129 Type 2 diabetes mellitus with other diabetic kidney complication: Secondary | ICD-10-CM

## 2014-07-16 DIAGNOSIS — Z125 Encounter for screening for malignant neoplasm of prostate: Secondary | ICD-10-CM

## 2014-07-17 ENCOUNTER — Other Ambulatory Visit: Payer: Self-pay

## 2014-07-17 LAB — COMPREHENSIVE METABOLIC PANEL
A/G RATIO: 1.6 (ref 1.1–2.5)
ALBUMIN: 4.3 g/dL (ref 3.5–5.5)
ALT: 36 IU/L (ref 0–44)
AST: 27 IU/L (ref 0–40)
Alkaline Phosphatase: 106 IU/L (ref 39–117)
BUN / CREAT RATIO: 13 (ref 9–20)
BUN: 13 mg/dL (ref 6–24)
Bilirubin Total: 0.3 mg/dL (ref 0.0–1.2)
CO2: 23 mmol/L (ref 18–29)
Calcium: 9.4 mg/dL (ref 8.7–10.2)
Chloride: 99 mmol/L (ref 97–108)
Creatinine, Ser: 1.04 mg/dL (ref 0.76–1.27)
GFR calc Af Amer: 92 mL/min/{1.73_m2} (ref >59)
GFR calc non Af Amer: 79 mL/min/{1.73_m2} (ref >59)
Globulin, Total: 2.7 g/dL (ref 1.5–4.5)
Glucose: 138 mg/dL — ABNORMAL HIGH (ref 65–99)
POTASSIUM: 4.7 mmol/L (ref 3.5–5.2)
Sodium: 143 mmol/L (ref 134–144)
Total Protein: 7 g/dL (ref 6.0–8.5)

## 2014-07-17 LAB — LIPID PANEL
CHOL/HDL RATIO: 3.2 ratio (ref 0.0–5.0)
Cholesterol, Total: 129 mg/dL (ref 100–199)
HDL: 40 mg/dL (ref >39)
LDL Calculated: 63 mg/dL (ref 0–99)
Triglycerides: 129 mg/dL (ref 0–149)
VLDL Cholesterol Cal: 26 mg/dL (ref 5–40)

## 2014-07-17 LAB — URINALYSIS, ROUTINE W REFLEX MICROSCOPIC
Bilirubin, UA: NEGATIVE
GLUCOSE, UA: NEGATIVE
KETONES UA: NEGATIVE
Leukocytes, UA: NEGATIVE
NITRITE UA: NEGATIVE
Protein, UA: NEGATIVE
RBC, UA: NEGATIVE
Specific Gravity, UA: 1.02 (ref 1.005–1.030)
UUROB: 0.2 mg/dL (ref 0.2–1.0)
pH, UA: 6.5 (ref 5.0–7.5)

## 2014-07-17 LAB — PSA: Prostate Specific Ag, Serum: 0.5 ng/mL (ref 0.0–4.0)

## 2014-07-17 LAB — MICROALBUMIN / CREATININE URINE RATIO
CREATININE, UR: 125.3 mg/dL
MICROALB/CREAT RATIO: 46.6 mg/g creat — ABNORMAL HIGH (ref 0.0–30.0)
Microalbumin, Urine: 58.4 ug/mL

## 2014-07-17 LAB — HEMOGLOBIN A1C
Est. average glucose Bld gHb Est-mCnc: 171 mg/dL
HEMOGLOBIN A1C: 7.6 % — AB (ref 4.8–5.6)

## 2014-07-17 MED ORDER — GLIPIZIDE 10 MG PO TABS: 10.0000 mg | ORAL_TABLET | Freq: Every day | ORAL | Status: DC

## 2014-09-05 ENCOUNTER — Other Ambulatory Visit: Payer: Self-pay | Admitting: *Deleted

## 2014-09-05 MED ORDER — OXYCODONE-ACETAMINOPHEN 5-325 MG PO TABS: ORAL_TABLET | ORAL | Status: DC

## 2014-09-05 NOTE — Telephone Encounter (Signed)
Patient requested and will pick up 

## 2014-10-06 ENCOUNTER — Other Ambulatory Visit: Payer: Self-pay | Admitting: Internal Medicine

## 2014-10-07 ENCOUNTER — Other Ambulatory Visit: Payer: Self-pay

## 2014-10-07 MED ORDER — ZOLPIDEM TARTRATE 10 MG PO TABS: ORAL_TABLET | ORAL | Status: DC

## 2014-10-16 ENCOUNTER — Encounter: Payer: Self-pay | Admitting: Internal Medicine

## 2014-10-16 ENCOUNTER — Ambulatory Visit (INDEPENDENT_AMBULATORY_CARE_PROVIDER_SITE_OTHER): Payer: BLUE CROSS/BLUE SHIELD | Admitting: Internal Medicine

## 2014-10-16 VITALS — BP 120/82 | HR 74 | Temp 98.1°F | Resp 18 | Ht 65.0 in | Wt 278.4 lb

## 2014-10-16 DIAGNOSIS — Z23 Encounter for immunization: Secondary | ICD-10-CM | POA: Diagnosis not present

## 2014-10-16 DIAGNOSIS — M5417 Radiculopathy, lumbosacral region: Secondary | ICD-10-CM | POA: Diagnosis not present

## 2014-10-16 DIAGNOSIS — F418 Other specified anxiety disorders: Secondary | ICD-10-CM

## 2014-10-16 DIAGNOSIS — G4700 Insomnia, unspecified: Secondary | ICD-10-CM | POA: Diagnosis not present

## 2014-10-16 DIAGNOSIS — E1129 Type 2 diabetes mellitus with other diabetic kidney complication: Secondary | ICD-10-CM | POA: Diagnosis not present

## 2014-10-16 DIAGNOSIS — E785 Hyperlipidemia, unspecified: Secondary | ICD-10-CM

## 2014-10-16 DIAGNOSIS — E1165 Type 2 diabetes mellitus with hyperglycemia: Secondary | ICD-10-CM

## 2014-10-16 DIAGNOSIS — IMO0002 Reserved for concepts with insufficient information to code with codable children: Secondary | ICD-10-CM

## 2014-10-16 DIAGNOSIS — I10 Essential (primary) hypertension: Secondary | ICD-10-CM

## 2014-10-16 MED ORDER — SERTRALINE HCL 100 MG PO TABS: 150.0000 mg | ORAL_TABLET | Freq: Every day | ORAL | Status: DC

## 2014-10-16 MED ORDER — METOPROLOL TARTRATE 25 MG PO TABS: 12.5000 mg | ORAL_TABLET | Freq: Two times a day (BID) | ORAL | Status: DC

## 2014-10-16 MED ORDER — OXYCODONE-ACETAMINOPHEN 5-325 MG PO TABS: ORAL_TABLET | ORAL | Status: DC

## 2014-10-16 NOTE — Progress Notes (Signed)
Patient ID: Barry [name], male   DOB: [birth year], 57 y.o.   MRN: [id number]    Location:    [facility]   Place of Service:  OFFICE   Chief Complaint  Patient presents with  . Medical Management of Chronic Issues    3 month follow-up for Hyperlipidemia, DM, Hypertension  . Immunizations    Will take flu shot toay    HPI:  59 yo male seen today for f/u. He reports increased stressors due to working long hrs and recent divorce. He is having trouble falling and staying asleep. He has ben doubling up on ambien at times. He has increased fatigue. Taking all meds as Rx. Has not really followed diet this past summer. He has gained 8 lbs since last OV  He has breakthrough anxiety/worrying on sertraline  qhs.  HTN - stable on lisinopril and lopressor  DM - BS checked fasting and range 150-190s. Occasional low BS reactions  (weak/dizziness) relieved with eating and/or drinking. He takes glipizide and metformin  Hyperlipidemia - takes statin qhs.  Knee pain - stable on prn percocet. He has b/l knee pain  CAD - stable. No CP. Occasional SOB. Takes BB, ACEI and plavix  Past Medical History  Diagnosis Date  . Coronary artery disease   . MI (myocardial infarction) 04/23/2007    inferior wall  . Sleep apnea   . Morbid obesity 06/26/2012  . S/P CABG x 3 07/04/2012    LIMA to LAD, SVG to D1, SVG to PDA, EVH via right thigh  . Unspecified essential hypertension   . Type II or unspecified type diabetes mellitus without mention of complication, not stated as uncontrolled     Past Surgical History  Procedure Laterality Date  . Coronary angioplasty with stent placement  04/23/2007    PCI and stenting of mid RCA - Dr Bary Castilla @ [facility]  . Coronary artery bypass graft N/A 07/04/2012    Procedure: CORONARY ARTERY BYPASS GRAFTING (CABG);  Surgeon: [name], MD;  Location: [facility] OR;  Service: Open Heart Surgery;  Laterality: N/A;  x3 using right greater saphenous vein and left internal mammary.   .  Intraoperative transesophageal echocardiogram N/A 07/04/2012    Procedure: INTRAOPERATIVE TRANSESOPHAGEAL ECHOCARDIOGRAM;  Surgeon: [name], MD;  Location: [facility] OR;  Service: Open Heart Surgery;  Laterality: N/A;  . Left heart catheterization with coronary angiogram N/A 06/25/2012    Procedure: LEFT HEART CATHETERIZATION WITH CORONARY ANGIOGRAM;  Surgeon: [name], MD;  Location: [facility] CATH LAB;  Service: Cardiovascular;  Laterality: N/A;    Patient Care Team: Kirt Boys, DO as PCP - General (Internal Medicine) [name], MD as Consulting Physician (Cardiology)  Social History   Social History  . Marital Status: Married    Spouse Name: N/A  . Number of Children: 1  . Years of Education: N/A   Occupational History  . MAINTENANCE    Social History Main Topics  . Smoking status: Never Smoker   . Smokeless tobacco: Never Used  . Alcohol Use: No  . Drug Use: No  . Sexual Activity: Not on file   Other Topics Concern  . Not on file   Social History Narrative     reports that he has never smoked. He has never used smokeless tobacco. He reports that he does not drink alcohol or use illicit drugs.  No Known Allergies  Medications: Patient's Medications  New Prescriptions   No medications on file  Previous Medications  ACCU-CHEK AVIVA PLUS TEST STRIP    TEST three times a day   ALBUTEROL (PROVENTIL HFA;VENTOLIN HFA) 108 (90 BASE) MCG/ACT INHALER    One puff three times daily for wheezing or shortness of breath   ASPIRIN 81 MG CHEWABLE TABLET    Chew 81 mg by mouth once.    ATORVASTATIN (LIPITOR) 80 MG TABLET    take 1 tablet by mouth once daily   CLOPIDOGREL (PLAVIX) 75 MG TABLET    take 1 tablet by mouth once daily   GLIPIZIDE (GLUCOTROL) 10 MG TABLET    Take 1 tablet (10 mg total) by mouth daily.   GLIPIZIDE (GLUCOTROL) 5 MG TABLET    Take 1 tablet (5 mg total) by mouth daily before breakfast.   LISINOPRIL (PRINIVIL,ZESTRIL) 5 MG TABLET    Take 1 tablet (5  mg total) by mouth daily.   METFORMIN (GLUCOPHAGE) 1000 MG TABLET    take 1 tablet by mouth twice a day for diabetes   METOPROLOL TARTRATE (LOPRESSOR) 25 MG TABLET    Take 0.5 tablets (12.5 mg total) by mouth 2 (two) times daily.   OMEPRAZOLE (PRILOSEC) 20 MG CAPSULE    take 1 tablet by mouth twice a day for 1 week then 1 tablet every morning ON AN EMPTY STOMACH   OXYCODONE-ACETAMINOPHEN (ROXICET) 5-325 MG PER TABLET    Take 2 tablet in the morning and one tablet in the evening as needed for pain   SERTRALINE (ZOLOFT) 100 MG TABLET    Take 1 tablet (100 mg total) by mouth at bedtime.   VIAGRA 100 MG TABLET    TAKE 1 TABLET BY MOUTH ONCE DAILY AS NEEDED   ZOLPIDEM (AMBIEN) 10 MG TABLET    take 1 tablet by mouth at bedtime if needed for sleep  Modified Medications   No medications on file  Discontinued Medications   No medications on file    Review of Systems  Constitutional: Positive for unexpected weight change. Negative for chills, activity change and fatigue.  HENT: Negative for sore throat and trouble swallowing.   Eyes: Negative for visual disturbance.  Respiratory: Negative for cough, chest tightness and shortness of breath.   Cardiovascular: Negative for chest pain, palpitations and leg swelling.  Gastrointestinal: Negative for nausea, vomiting, abdominal pain and blood in stool.  Genitourinary: Negative for urgency, frequency and difficulty urinating.  Musculoskeletal: Negative for arthralgias and gait problem.  Skin: Negative for rash.  Neurological: Positive for dizziness. Negative for weakness and headaches.  Psychiatric/Behavioral: Positive for sleep disturbance and dysphoric mood. Negative for confusion. The patient is not nervous/anxious.     Filed Vitals:   10/16/14 0816  BP: 120/82  Pulse: 74  Temp: 98.1 F (36.7 C)  TempSrc: Oral  Resp: 18  Height: 5\' 5"  (1.651 m)  Weight: 278 lb 6.4 oz (126.281 kg)  SpO2: 96%   Body mass index is 46.33 kg/(m^2).  Physical  Exam  Constitutional: He is oriented to person, place, and time. He appears well-developed and well-nourished.  HENT:  Mouth/Throat: Oropharynx is clear and moist.  Eyes: Pupils are equal, round, and reactive to light. No scleral icterus.  Neck: Neck supple. Carotid bruit is not present. No thyromegaly present.  Cardiovascular: Normal rate, regular rhythm, normal heart sounds and intact distal pulses.  Exam reveals no gallop and no friction rub.   No murmur heard. no distal LE swelling. No calf TTP. Varicose veins b/l calf. NT and no redness  Pulmonary/Chest: Effort normal and breath sounds  normal. He has no wheezes. He has no rales. He exhibits no tenderness.  Abdominal: Soft. Bowel sounds are normal. He exhibits no distension, no abdominal bruit, no pulsatile midline mass and no mass. There is no tenderness. There is no rebound and no guarding.  Lymphadenopathy:    He has no cervical adenopathy.  Neurological: He is alert and oriented to person, place, and time. He has normal reflexes.  Skin: Skin is warm and dry. No rash noted.  Psychiatric: His behavior is normal. Thought content normal. He exhibits a depressed mood.     Labs reviewed: No visits with results within 3 Month(s) from this visit. Latest known visit with results is:  Appointment on 07/16/2014  Component Date Value Ref Range Status  . Glucose 07/16/2014 138* 65 - 99 mg/dL Final  . BUN 04/54/0981 13  6 - 24 mg/dL Final  . Creatinine, Ser 07/16/2014 1.04  0.76 - 1.27 mg/dL Final  . GFR calc non Af Amer 07/16/2014 79  >59 mL/min/1.73 Final  . GFR calc Af Amer 07/16/2014 92  >59 mL/min/1.73 Final  . BUN/Creatinine Ratio 07/16/2014 13  9 - 20 Final  . Sodium 07/16/2014 143  134 - 144 mmol/L Final  . Potassium 07/16/2014 4.7  3.5 - 5.2 mmol/L Final  . Chloride 07/16/2014 99  97 - 108 mmol/L Final  . CO2 07/16/2014 23  18 - 29 mmol/L Final  . Calcium 07/16/2014 9.4  8.7 - 10.2 mg/dL Final  . Total Protein 07/16/2014 7.0   6.0 - 8.5 g/dL Final  . Albumin 19/14/7829 4.3  3.5 - 5.5 g/dL Final  . Globulin, Total 07/16/2014 2.7  1.5 - 4.5 g/dL Final  . Albumin/Globulin Ratio 07/16/2014 1.6  1.1 - 2.5 Final  . Bilirubin Total 07/16/2014 0.3  0.0 - 1.2 mg/dL Final  . Alkaline Phosphatase 07/16/2014 106  39 - 117 IU/L Final  . AST 07/16/2014 27  0 - 40 IU/L Final  . ALT 07/16/2014 36  0 - 44 IU/L Final  . Cholesterol, Total 07/16/2014 129  100 - 199 mg/dL Final  . Triglycerides 07/16/2014 129  0 - 149 mg/dL Final  . HDL 56/21/3086 40  >39 mg/dL Final   Comment: According to ATP-III Guidelines, HDL-C >59 mg/dL is considered a negative risk factor for CHD.   Marland Kitchen VLDL Cholesterol Cal 07/16/2014 26  5 - 40 mg/dL Final  . LDL Calculated 07/16/2014 63  0 - 99 mg/dL Final  . Chol/HDL Ratio 07/16/2014 3.2  0.0 - 5.0 ratio units Final   Comment:                                   T. Chol/HDL Ratio                                             Men  Women                               1/2 Avg.Risk  3.4    3.3                                   Avg.Risk  5.0    4.4  2X Avg.Risk  9.6    7.1                                3X Avg.Risk 23.4   11.0   . Prostate Specific Ag, Serum 07/16/2014 0.5  0.0 - 4.0 ng/mL Final   Comment: Roche ECLIA methodology. According to the American Urological Association, Serum PSA should decrease and remain at undetectable levels after radical prostatectomy. The AUA defines biochemical recurrence as an initial PSA value 0.2 ng/mL or greater followed by a subsequent confirmatory PSA value 0.2 ng/mL or greater. Values obtained with different assay methods or kits cannot be used interchangeably. Results cannot be interpreted as absolute evidence of the presence or absence of malignant disease.   . Hgb A1c MFr Bld 07/16/2014 7.6* 4.8 - 5.6 % Final   Comment:          Pre-diabetes: 5.7 - 6.4          Diabetes: >6.4          Glycemic control for adults with diabetes:  <7.0   . Est. average glucose Bld gHb Est-m* 07/16/2014 171   Final  . Specific Gravity, UA 07/16/2014 1.020  1.005 - 1.030 Final  . pH, UA 07/16/2014 6.5  5.0 - 7.5 Final  . Color, UA 07/16/2014 Yellow  Yellow Final  . Appearance Ur 07/16/2014 Clear  Clear Final  . Leukocytes, UA 07/16/2014 Negative  Negative Final  . Protein, UA 07/16/2014 Negative  Negative/Trace Final  . Glucose, UA 07/16/2014 Negative  Negative Final  . Ketones, UA 07/16/2014 Negative  Negative Final  . RBC, UA 07/16/2014 Negative  Negative Final  . Bilirubin, UA 07/16/2014 Negative  Negative Final  . Urobilinogen, Ur 07/16/2014 0.2  0.2 - 1.0 mg/dL Final  . Nitrite, UA 91/47/8295 Negative  Negative Final  . Microscopic Examination 07/16/2014 Comment   Final   Microscopic not indicated and not performed.  . Creatinine, Urine 07/16/2014 125.3  Not Estab. mg/dL Final  . Microalbum.,U,Random 07/16/2014 58.4  Not Estab. ug/mL Final  . MICROALB/CREAT RATIO 07/16/2014 46.6* 0.0 - 30.0 mg/g creat Final    No results found.   Assessment/Plan   ICD-9-CM ICD-10-CM   1. Depression with anxiety - uncontrolled 300.4 F41.8   2. Encounter for immunization Z23 Z23   3. Insomnia - uncontrolled due to #1 780.52 G47.00   4. Lumbosacral radiculopathy - pain stable 724.4 M54.17   5. Morbid obesity 278.01 E66.01   6. Essential hypertension, benign - stable 401.1 I10   7. Diabetes mellitus with renal manifestations, uncontrolled - BS fluctuating 250.42 E11.29     E11.65   8. Hyperlipidemia LDL goal <70 - stable 272.4 E78.5     --increase sertraline 1.5 tabs qhs  --reduce ambien to 1 tab qhs  --cont other meds as ordered  --check fasting BMP, ALT, lipid panel and A1c today  --f/u in 3 mos for routine visit. Check fasting labs 2-3 days prior to appt  [facility] S. Ancil Linsey  [facility] and [facility] [address] [place], [place] [address] [phone] Cell (Monday-Friday 8 AM - 5  PM) [phone] After 5 PM and follow prompts

## 2014-10-16 NOTE — Patient Instructions (Signed)
Flu shot given today  Increase sertraline to 1.5 tabs daily  Take only 1 ambien at bedtime  Continue other medications as ordered  Follow up in 3 mos for routine visit. Fasting labs 2-3 days prior to appt

## 2014-10-17 LAB — BASIC METABOLIC PANEL
BUN / CREAT RATIO: 16 (ref 9–20)
BUN: 14 mg/dL (ref 6–24)
CHLORIDE: 99 mmol/L (ref 97–108)
CO2: 25 mmol/L (ref 18–29)
Calcium: 9.2 mg/dL (ref 8.7–10.2)
Creatinine, Ser: 0.9 mg/dL (ref 0.76–1.27)
GFR calc non Af Amer: 94 mL/min/{1.73_m2} (ref >59)
GFR, EST AFRICAN AMERICAN: 109 mL/min/{1.73_m2} (ref >59)
GLUCOSE: 122 mg/dL — AB (ref 65–99)
Potassium: 4.7 mmol/L (ref 3.5–5.2)
SODIUM: 139 mmol/L (ref 134–144)

## 2014-10-17 LAB — LIPID PANEL
CHOL/HDL RATIO: 3.1 ratio (ref 0.0–5.0)
CHOLESTEROL TOTAL: 101 mg/dL (ref 100–199)
HDL: 33 mg/dL — ABNORMAL LOW (ref >39)
LDL CALC: 47 mg/dL (ref 0–99)
Triglycerides: 107 mg/dL (ref 0–149)
VLDL Cholesterol Cal: 21 mg/dL (ref 5–40)

## 2014-10-17 LAB — ALT: ALT: 38 IU/L (ref 0–44)

## 2014-10-17 LAB — HEMOGLOBIN A1C
Est. average glucose Bld gHb Est-mCnc: 180 mg/dL
Hgb A1c MFr Bld: 7.9 % — ABNORMAL HIGH (ref 4.8–5.6)

## 2014-12-10 ENCOUNTER — Other Ambulatory Visit: Payer: Self-pay | Admitting: *Deleted

## 2014-12-10 DIAGNOSIS — M5417 Radiculopathy, lumbosacral region: Secondary | ICD-10-CM

## 2014-12-10 MED ORDER — OXYCODONE-ACETAMINOPHEN 5-325 MG PO TABS: ORAL_TABLET | ORAL | Status: DC

## 2014-12-10 NOTE — Telephone Encounter (Signed)
Patient requested and will pick up. Narcotic letter printed.

## 2015-01-08 ENCOUNTER — Telehealth: Payer: Self-pay | Admitting: *Deleted

## 2015-01-08 NOTE — Telephone Encounter (Signed)
Patient called to request that labs be drawn to see if he might have Lupus, he states that he has some of the symptoms: aches & pains, trouble breathing but will talk more in depth at his appointment on 01/15/15. Please Advise!

## 2015-01-08 NOTE — Telephone Encounter (Signed)
Will d/w pt at his appt

## 2015-01-12 ENCOUNTER — Other Ambulatory Visit: Payer: Self-pay | Admitting: Internal Medicine

## 2015-01-13 ENCOUNTER — Other Ambulatory Visit: Payer: Self-pay | Admitting: *Deleted

## 2015-01-13 ENCOUNTER — Other Ambulatory Visit: Payer: BLUE CROSS/BLUE SHIELD

## 2015-01-13 DIAGNOSIS — I10 Essential (primary) hypertension: Secondary | ICD-10-CM

## 2015-01-13 DIAGNOSIS — E1129 Type 2 diabetes mellitus with other diabetic kidney complication: Secondary | ICD-10-CM

## 2015-01-13 DIAGNOSIS — E1165 Type 2 diabetes mellitus with hyperglycemia: Secondary | ICD-10-CM

## 2015-01-13 DIAGNOSIS — IMO0002 Reserved for concepts with insufficient information to code with codable children: Secondary | ICD-10-CM

## 2015-01-13 DIAGNOSIS — E785 Hyperlipidemia, unspecified: Secondary | ICD-10-CM

## 2015-01-13 MED ORDER — ZOLPIDEM TARTRATE 10 MG PO TABS: ORAL_TABLET | ORAL | Status: DC

## 2015-01-13 NOTE — Telephone Encounter (Signed)
[facility] [employer]

## 2015-01-14 LAB — LIPID PANEL
CHOLESTEROL TOTAL: 113 mg/dL (ref 100–199)
Chol/HDL Ratio: 3.2 ratio units (ref 0.0–5.0)
HDL: 35 mg/dL — ABNORMAL LOW (ref >39)
LDL CALC: 57 mg/dL (ref 0–99)
TRIGLYCERIDES: 107 mg/dL (ref 0–149)
VLDL Cholesterol Cal: 21 mg/dL (ref 5–40)

## 2015-01-14 LAB — ALT: ALT: 43 IU/L (ref 0–44)

## 2015-01-14 LAB — HEMOGLOBIN A1C
Est. average glucose Bld gHb Est-mCnc: 183 mg/dL
Hgb A1c MFr Bld: 8 % — ABNORMAL HIGH (ref 4.8–5.6)

## 2015-01-14 LAB — BASIC METABOLIC PANEL
BUN/Creatinine Ratio: 15 (ref 9–20)
BUN: 14 mg/dL (ref 6–24)
CALCIUM: 9.1 mg/dL (ref 8.7–10.2)
CHLORIDE: 99 mmol/L (ref 96–106)
CO2: 24 mmol/L (ref 18–29)
Creatinine, Ser: 0.94 mg/dL (ref 0.76–1.27)
GFR calc Af Amer: 104 mL/min/{1.73_m2} (ref >59)
GFR calc non Af Amer: 90 mL/min/{1.73_m2} (ref >59)
Glucose: 153 mg/dL — ABNORMAL HIGH (ref 65–99)
POTASSIUM: 4.6 mmol/L (ref 3.5–5.2)
Sodium: 138 mmol/L (ref 134–144)

## 2015-01-15 ENCOUNTER — Encounter: Payer: Self-pay | Admitting: Internal Medicine

## 2015-01-15 ENCOUNTER — Ambulatory Visit
Admission: RE | Admit: 2015-01-15 | Discharge: 2015-01-15 | Disposition: A | Discharge status: Home / Self Care | Payer: BLUE CROSS/BLUE SHIELD | Source: Ambulatory Visit | Attending: Internal Medicine | Admitting: Internal Medicine

## 2015-01-15 ENCOUNTER — Ambulatory Visit (INDEPENDENT_AMBULATORY_CARE_PROVIDER_SITE_OTHER): Payer: BLUE CROSS/BLUE SHIELD | Admitting: Internal Medicine

## 2015-01-15 VITALS — BP 138/88 | HR 67 | Temp 98.2°F | Resp 20 | Ht 65.0 in | Wt 282.2 lb

## 2015-01-15 DIAGNOSIS — E785 Hyperlipidemia, unspecified: Secondary | ICD-10-CM | POA: Diagnosis not present

## 2015-01-15 DIAGNOSIS — M5417 Radiculopathy, lumbosacral region: Secondary | ICD-10-CM

## 2015-01-15 DIAGNOSIS — Z794 Long term (current) use of insulin: Secondary | ICD-10-CM | POA: Diagnosis not present

## 2015-01-15 DIAGNOSIS — E1122 Type 2 diabetes mellitus with diabetic chronic kidney disease: Secondary | ICD-10-CM

## 2015-01-15 DIAGNOSIS — I10 Essential (primary) hypertension: Secondary | ICD-10-CM | POA: Diagnosis not present

## 2015-01-15 DIAGNOSIS — F418 Other specified anxiety disorders: Secondary | ICD-10-CM | POA: Diagnosis not present

## 2015-01-15 DIAGNOSIS — K219 Gastro-esophageal reflux disease without esophagitis: Secondary | ICD-10-CM | POA: Diagnosis not present

## 2015-01-15 DIAGNOSIS — E1165 Type 2 diabetes mellitus with hyperglycemia: Secondary | ICD-10-CM

## 2015-01-15 DIAGNOSIS — G4700 Insomnia, unspecified: Secondary | ICD-10-CM | POA: Diagnosis not present

## 2015-01-15 DIAGNOSIS — G4733 Obstructive sleep apnea (adult) (pediatric): Secondary | ICD-10-CM | POA: Diagnosis not present

## 2015-01-15 DIAGNOSIS — R062 Wheezing: Secondary | ICD-10-CM

## 2015-01-15 DIAGNOSIS — R0602 Shortness of breath: Secondary | ICD-10-CM | POA: Diagnosis not present

## 2015-01-15 MED ORDER — OXYCODONE-ACETAMINOPHEN 5-325 MG PO TABS: ORAL_TABLET | ORAL | Status: DC

## 2015-01-15 NOTE — Progress Notes (Signed)
Patient ID: Barry [name], male   DOB: 11/06/1955, 59 y.o.   MRN: [id number]    Location:    [facility]   Place of Service:  OFFICE   Chief Complaint  Patient presents with  . Medical Management of Chronic Issues    3 month follow-up for Hypertension, DM  . OTHER    needs note for work    HPI:   59 yo male seen today for f/u. "I feel like I'm falling apart". He reports at night he has HA, SOB,and fatigue. He has to frequently stop while driving home to take a nap due to fatigue. He uses CPAP most nights but some times unable to tolerate it. He reports increased wheezing. Last CXR in 2014 revealed small partially loculated left effusion with associated left mid and lower lung heterogeneous opacities, atelectasis vs infiltrate. He reports his sx's are worse when he lies on left side  He has breakthrough anxiety/worrying on sertraline  qhs.  HTN - stable on lisinopril and lopressor  DM - BS checked fasting and range 150-160s. Occasional low BS reactions  (weak/dizziness/shaking) relieved with eating and/or drinking. He takes glipizide and metformin. A1c 8% now  Hyperlipidemia - takes statin qhs.  Knee pain - stable on prn percocet. He has b/l knee pain. He has pain in his ankles at night when he tries to cross them. He states "my whole body hurts"  CAD - stable. No CP. Occasional SOB. Takes BB, ACEI and plavix. He saw cardio in March and was told he was stable and to f/u in 1 yr  Past Medical History  Diagnosis Date  . Coronary artery disease   . MI (myocardial infarction) (HCC) 04/23/2007    inferior wall  . Sleep apnea   . Morbid obesity (HCC) 06/26/2012  . S/P CABG x 3 07/04/2012    LIMA to LAD, SVG to D1, SVG to PDA, EVH via right thigh  . Unspecified essential hypertension   . Type II or unspecified type diabetes mellitus without mention of complication, not stated as uncontrolled     Past Surgical History  Procedure Laterality Date  . Coronary angioplasty with stent placement   04/23/2007    PCI and stenting of mid RCA - Dr Bary Castilla @ [facility]  . Coronary artery bypass graft N/A 07/04/2012    Procedure: CORONARY ARTERY BYPASS GRAFTING (CABG);  Surgeon: [name], MD;  Location: [facility] OR;  Service: Open Heart Surgery;  Laterality: N/A;  x3 using right greater saphenous vein and left internal mammary.   . Intraoperative transesophageal echocardiogram N/A 07/04/2012    Procedure: INTRAOPERATIVE TRANSESOPHAGEAL ECHOCARDIOGRAM;  Surgeon: [name], MD;  Location: [facility] OR;  Service: Open Heart Surgery;  Laterality: N/A;  . Left heart catheterization with coronary angiogram N/A 06/25/2012    Procedure: LEFT HEART CATHETERIZATION WITH CORONARY ANGIOGRAM;  Surgeon: [name], MD;  Location: [facility] CATH LAB;  Service: Cardiovascular;  Laterality: N/A;    Patient Care Team: Kirt Boys, DO as PCP - General (Internal Medicine) [name], MD as Consulting Physician (Cardiology)  Social History   Social History  . Marital Status: Married    Spouse Name: N/A  . Number of Children: 1  . Years of Education: N/A   Occupational History  . MAINTENANCE    Social History Main Topics  . Smoking status: Never Smoker   . Smokeless tobacco: Never Used  . Alcohol Use: No  . Drug Use: No  . Sexual Activity: Not  on file   Other Topics Concern  . Not on file   Social History Narrative     reports that he has never smoked. He has never used smokeless tobacco. He reports that he does not drink alcohol or use illicit drugs.  No Known Allergies  Medications: Patient's Medications  New Prescriptions   No medications on file  Previous Medications   ACCU-CHEK AVIVA PLUS TEST STRIP    TEST three times a day   ALBUTEROL (PROVENTIL HFA;VENTOLIN HFA) 108 (90 BASE) MCG/ACT INHALER    One puff three times daily for wheezing or shortness of breath   ASPIRIN 81 MG CHEWABLE TABLET    Chew 81 mg by mouth once.    ATORVASTATIN (LIPITOR) 80 MG TABLET    take 1 tablet by mouth  once daily   CLOPIDOGREL (PLAVIX) 75 MG TABLET    take 1 tablet by mouth once daily   GLIPIZIDE (GLUCOTROL) 10 MG TABLET    take 1 tablet by mouth once daily   LISINOPRIL (PRINIVIL,ZESTRIL) 5 MG TABLET    Take 1 tablet (5 mg total) by mouth daily.   METFORMIN (GLUCOPHAGE) 1000 MG TABLET    take 1 tablet by mouth twice a day for diabetes   METOPROLOL TARTRATE (LOPRESSOR) 25 MG TABLET    Take 0.5 tablets (12.5 mg total) by mouth 2 (two) times daily.   OMEPRAZOLE (PRILOSEC) 20 MG CAPSULE    take 1 tablet by mouth twice a day for 1 week then 1 tablet every morning ON AN EMPTY STOMACH   OXYCODONE-ACETAMINOPHEN (ROXICET) 5-325 MG TABLET    Take 2 tablet in the morning and one tablet in the evening as needed for pain   SERTRALINE (ZOLOFT) 100 MG TABLET    Take 1.5 tablets (150 mg total) by mouth at bedtime.   VIAGRA 100 MG TABLET    TAKE 1 TABLET BY MOUTH ONCE DAILY AS NEEDED   ZOLPIDEM (AMBIEN) 10 MG TABLET    Take one tablet by mouth at bedtime as needed for sleep  Modified Medications   No medications on file  Discontinued Medications   GLIPIZIDE (GLUCOTROL) 5 MG TABLET    Take 1 tablet (5 mg total) by mouth daily before breakfast.    Review of Systems  Constitutional: Positive for activity change and fatigue. Negative for chills.  HENT: Negative for sore throat and trouble swallowing.   Eyes: Negative for visual disturbance.  Respiratory: Negative for cough, chest tightness and shortness of breath.   Cardiovascular: Positive for chest pain (occasional ) and leg swelling. Negative for palpitations.  Gastrointestinal: Negative for nausea, vomiting, abdominal pain and blood in stool.  Endocrine: Positive for polyuria.  Genitourinary: Negative for urgency, frequency and difficulty urinating.  Musculoskeletal: Positive for back pain, arthralgias and gait problem.  Skin: Negative for rash.  Neurological: Negative for weakness and headaches.  Psychiatric/Behavioral: Negative for confusion and  sleep disturbance. The patient is not nervous/anxious.     Filed Vitals:   01/15/15 1352  BP: 138/88  Pulse: 67  Temp: 98.2 F (36.8 C)  TempSrc: Oral  Resp: 20  Height: 5\' 5"  (1.651 m)  Weight: 282 lb 3.2 oz (128.005 kg)  SpO2: 96%   Body mass index is 46.96 kg/(m^2).  Physical Exam  Constitutional: He is oriented to person, place, and time. He appears well-developed and well-nourished.  No conversational dyspnea. Looks tired in NAD  HENT:  Mouth/Throat: Oropharynx is clear and moist.  Eyes: Pupils are equal, round, and  reactive to light. No scleral icterus.  Neck: Neck supple. Carotid bruit is not present. No thyromegaly present.  Cardiovascular: Normal rate, regular rhythm, normal heart sounds and intact distal pulses.  Exam reveals no gallop and no friction rub.   No murmur heard. no distal LE swelling. No calf TTP  Pulmonary/Chest: Effort normal. He has decreased breath sounds. He has no wheezes. He has rales (left base).   He exhibits no tenderness.  Abdominal: Soft. Bowel sounds are normal. He exhibits no distension, no abdominal bruit, no pulsatile midline mass and no mass. There is no tenderness. There is no rebound and no guarding.  obese  Musculoskeletal: He exhibits edema and tenderness.  Lymphadenopathy:    He has no cervical adenopathy.  Neurological: He is alert and oriented to person, place, and time. He has normal reflexes.  Skin: Skin is warm and dry. No rash noted.  Psychiatric: He has a normal mood and affect. His behavior is normal. Judgment and thought content normal.     Labs reviewed: Appointment on 01/13/2015  Component Date Value Ref Range Status  . Glucose 01/13/2015 153* 65 - 99 mg/dL Final  . BUN 16/10/9602 14  6 - 24 mg/dL Final  . Creatinine, Ser 01/13/2015 0.94  0.76 - 1.27 mg/dL Final  . GFR calc non Af Amer 01/13/2015 90  >59 mL/min/1.73 Final  . GFR calc Af Amer 01/13/2015 104  >59 mL/min/1.73 Final  . BUN/Creatinine Ratio 01/13/2015  15  9 - 20 Final  . Sodium 01/13/2015 138  134 - 144 mmol/L Final                 **Please note reference interval change**  . Potassium 01/13/2015 4.6  3.5 - 5.2 mmol/L Final  . Chloride 01/13/2015 99  96 - 106 mmol/L Final                 **Please note reference interval change**  . CO2 01/13/2015 24  18 - 29 mmol/L Final  . Calcium 01/13/2015 9.1  8.7 - 10.2 mg/dL Final  . ALT 54/09/8117 43  0 - 44 IU/L Final  . Cholesterol, Total 01/13/2015 113  100 - 199 mg/dL Final  . Triglycerides 01/13/2015 107  0 - 149 mg/dL Final  . HDL 14/78/2956 35* >39 mg/dL Final  . VLDL Cholesterol Cal 01/13/2015 21  5 - 40 mg/dL Final  . LDL Calculated 01/13/2015 57  0 - 99 mg/dL Final  . Chol/HDL Ratio 01/13/2015 3.2  0.0 - 5.0 ratio units Final   Comment:                                   T. Chol/HDL Ratio                                             Men  Women                               1/2 Avg.Risk  3.4    3.3                                   Avg.Risk  5.0    4.4  2X Avg.Risk  9.6    7.1                                3X Avg.Risk 23.4   11.0   . Hgb A1c MFr Bld 01/13/2015 8.0* 4.8 - 5.6 % Final   Comment:          Pre-diabetes: 5.7 - 6.4          Diabetes: >6.4          Glycemic control for adults with diabetes: <7.0   . Est. average glucose Bld gHb Est-m* 01/13/2015 183   Final    No results found.   Assessment/Plan   ICD-9-CM ICD-10-CM   1. Shortness of breath - worse 786.05 R06.02 DG Chest 2 View  2. Wheezing - worse 786.07 R06.2 DG Chest 2 View  3. Uncontrolled type 2 diabetes mellitus with chronic kidney disease, without long-term current use of insulin, unspecified CKD stage (HCC) 250.42 E11.22    585.9 Z79.4    V58.67 E11.65   4. Lumbosacral radiculopathy - stable 724.4 M54.17 oxyCODONE-acetaminophen (ROXICET) 5-325 MG tablet  5. Morbid obesity due to excess calories (HCC) - unchanged 278.01 E66.01   6. Essential hypertension, benign - stable 401.1  I10   7. Hyperlipidemia LDL goal <70 - stable 272.4 E78.5   8. Gastroesophageal reflux disease, esophagitis presence not specified - stable 530.81 K21.9   9. Depression with anxiety - stable 300.4 F41.8   10. Insomnia - stable 780.52 G47.00   11. Obstructive sleep apnea on CPAP 327.23 G47.33     Refer for diabetic insulin teaching (NEW) with Barry [name]  Continue other medications as ordered  Reduce complex carbs in diet  Check CXR. May need CT chest to further eval  Follow up in 1 month for shortness of breath  Barry Mccaster S. Ancil Linsey  [facility] and Adult [facility] [address] [place], [place] [address] [phone] Cell (Monday-Friday 8 AM - 5 PM) [phone] After 5 PM and follow prompts

## 2015-01-15 NOTE — Patient Instructions (Signed)
Please schedule appt for diabetic insulin teaching (NEW) with Edison Pace  Continue other medications as ordered  Reduce complex carbs in diet  Will call with xray results  Follow up in 1 month for shortness of breath

## 2015-01-16 ENCOUNTER — Telehealth: Payer: Self-pay

## 2015-01-16 DIAGNOSIS — R9389 Abnormal findings on diagnostic imaging of other specified body structures: Secondary | ICD-10-CM

## 2015-01-16 DIAGNOSIS — R0989 Other specified symptoms and signs involving the circulatory and respiratory systems: Secondary | ICD-10-CM

## 2015-01-16 NOTE — Telephone Encounter (Signed)
-----   Message from [place], [place] sent at 01/15/2015  4:37 PM EST ----- Xray reveals no acute process; he does have prominent lung blood vessels - recommend CT chest without contrast to further evaluate; no obvious fluid in lungs; f/u as scheduled

## 2015-01-16 NOTE — Telephone Encounter (Signed)
Discussed with patient, patient verbalized understanding of results. Order placed for CT.

## 2015-01-30 ENCOUNTER — Inpatient Hospital Stay: Admission: RE | Admit: 2015-01-30 | Payer: BLUE CROSS/BLUE SHIELD | Source: Ambulatory Visit

## 2015-02-07 ENCOUNTER — Ambulatory Visit
Admission: RE | Admit: 2015-02-07 | Discharge: 2015-02-07 | Disposition: A | Discharge status: Home / Self Care | Payer: BLUE CROSS/BLUE SHIELD | Source: Ambulatory Visit | Attending: Internal Medicine | Admitting: Internal Medicine

## 2015-02-07 DIAGNOSIS — R9389 Abnormal findings on diagnostic imaging of other specified body structures: Secondary | ICD-10-CM

## 2015-02-07 DIAGNOSIS — R0989 Other specified symptoms and signs involving the circulatory and respiratory systems: Secondary | ICD-10-CM

## 2015-02-10 ENCOUNTER — Ambulatory Visit: Payer: BLUE CROSS/BLUE SHIELD | Admitting: Pharmacotherapy

## 2015-02-11 ENCOUNTER — Other Ambulatory Visit: Payer: Self-pay

## 2015-02-11 DIAGNOSIS — R0602 Shortness of breath: Secondary | ICD-10-CM

## 2015-02-11 DIAGNOSIS — R062 Wheezing: Secondary | ICD-10-CM

## 2015-02-17 ENCOUNTER — Encounter: Payer: Self-pay | Admitting: Pharmacotherapy

## 2015-02-17 ENCOUNTER — Ambulatory Visit (INDEPENDENT_AMBULATORY_CARE_PROVIDER_SITE_OTHER): Payer: BLUE CROSS/BLUE SHIELD | Admitting: Pharmacotherapy

## 2015-02-17 VITALS — BP 118/80 | HR 64 | Temp 98.2°F | Ht 65.0 in | Wt 275.4 lb

## 2015-02-17 DIAGNOSIS — E1165 Type 2 diabetes mellitus with hyperglycemia: Secondary | ICD-10-CM | POA: Diagnosis not present

## 2015-02-17 DIAGNOSIS — Z794 Long term (current) use of insulin: Secondary | ICD-10-CM | POA: Diagnosis not present

## 2015-02-17 DIAGNOSIS — I10 Essential (primary) hypertension: Secondary | ICD-10-CM

## 2015-02-17 DIAGNOSIS — E1122 Type 2 diabetes mellitus with diabetic chronic kidney disease: Secondary | ICD-10-CM

## 2015-02-17 MED ORDER — INSULIN PEN NEEDLE 32G X 4 MM MISC: 1.0000 | Freq: Every day | Status: DC

## 2015-02-17 MED ORDER — INSULIN GLARGINE 300 UNIT/ML ~~LOC~~ SOPN: 10.0000 [IU] | PEN_INJECTOR | Freq: Every day | SUBCUTANEOUS | Status: DC

## 2015-02-17 NOTE — Patient Instructions (Signed)
Start Toujeo 10 units once daily Exercise goal is 30-45 minutes 5 x week.

## 2015-02-17 NOTE — Progress Notes (Signed)
  Subjective:    Barry [name] is a 60 y.o.white  male who presents for follow-up of Type 2 diabetes mellitus.  Referred by Dr. Eulas Post to start insulin therapy. Most recent A1C is 8% Currently on metformin and glipizide.  He had CABG x 3 vessels in 2014.  Has been on medication since then. Significant FH for DM.  He is youngest of 8 children.  No special diets.  Struggles with healthy eating. Walks every morning - picks up trash in the mornings. Has been feeling more fatigue recently. Denies problems with feet. Only has left eye - wears corrective lenses.  Eye exam is due. Nocturia at least 3 times per night. Denies peripheral edema.  Has PMH significant for pancreatitis.  Was in hospital x 10 days. Wears CPAP at night Complains of wheezing.  Logbook BG: 95-168.  Highest BG:  169m/dl    Review of Systems A comprehensive review of systems was negative except for: Eyes: positive for prothetic eye Cardiovascular: positive for palpitations Genitourinary: positive for nocturia    Objective:    BP 118/80 mmHg  Pulse 64  Temp(Src) 98.2 F (36.8 C) (Oral)  Ht 5' 5"$  (1.651 m)  Wt 275 lb 6.4 oz (124.921 kg)  BMI 45.83 kg/m2  SpO2 96%  General:  alert, cooperative and no distress  Oropharynx: normal findings: lips normal without lesions and gums healthy   Eyes:  negative findings: lids and lashes normal and conjunctivae and sclerae normal   Ears:  external ears normal        Lung: clear to auscultation bilaterally  Heart:  regular rate and rhythm     Extremities: extremities normal, atraumatic, no cyanosis or edema  Skin: warm and dry, no hyperpigmentation, vitiligo, or suspicious lesions     Neuro: mental status, speech normal, alert and oriented x3 and gait and station normal   Lab Review GLUCOSE (mg/dL)  Date Value  01/13/2015 153*  10/16/2014 122*  07/16/2014 138*   GLUCOSE, BLD (mg/dL)  Date Value  07/06/2012 196*  07/05/2012 127*  07/04/2012 143*   CO2  (mmol/L)  Date Value  01/13/2015 24  10/16/2014 25  07/16/2014 23   BUN (mg/dL)  Date Value  01/13/2015 14  10/16/2014 14  07/16/2014 13  07/06/2012 12  07/05/2012 9  07/04/2012 9   CREATININE, SER (mg/dL)  Date Value  01/13/2015 0.94  10/16/2014 0.90  07/16/2014 1.04       Assessment:    Diabetes Mellitus type II, under poor control. A1C above goal <7% BP at goal <140/90   Plan:    1.  Rx changes: start Toujeo 10 units once daily.  Taught how to self inject.  First dose given in office. 2.  Continue metformin. 3.  Counseled on progression of DM and need for exogenous insulin. 4.  Not a candidate for GLP 1 - PMH significant for pancreatits.  May be a candidate for SGLT-2 in the future. 5.  Counseled on nutrition goals. 6.  Counseled on need for routine exercise.  7.  BP at goal <140/90. 8.  RTC in 6 weeks.

## 2015-02-19 ENCOUNTER — Ambulatory Visit (INDEPENDENT_AMBULATORY_CARE_PROVIDER_SITE_OTHER): Payer: BLUE CROSS/BLUE SHIELD | Admitting: Internal Medicine

## 2015-02-19 ENCOUNTER — Encounter: Payer: Self-pay | Admitting: Internal Medicine

## 2015-02-19 VITALS — BP 128/70 | HR 68 | Temp 98.3°F | Resp 20 | Ht 65.0 in | Wt 272.2 lb

## 2015-02-19 DIAGNOSIS — R9389 Abnormal findings on diagnostic imaging of other specified body structures: Secondary | ICD-10-CM

## 2015-02-19 DIAGNOSIS — E785 Hyperlipidemia, unspecified: Secondary | ICD-10-CM | POA: Diagnosis not present

## 2015-02-19 DIAGNOSIS — M5417 Radiculopathy, lumbosacral region: Secondary | ICD-10-CM | POA: Diagnosis not present

## 2015-02-19 DIAGNOSIS — M62838 Other muscle spasm: Secondary | ICD-10-CM | POA: Diagnosis not present

## 2015-02-19 DIAGNOSIS — R938 Abnormal findings on diagnostic imaging of other specified body structures: Secondary | ICD-10-CM | POA: Diagnosis not present

## 2015-02-19 DIAGNOSIS — R0602 Shortness of breath: Secondary | ICD-10-CM

## 2015-02-19 MED ORDER — OXYCODONE-ACETAMINOPHEN 5-325 MG PO TABS: ORAL_TABLET | ORAL | Status: DC

## 2015-02-19 NOTE — Progress Notes (Signed)
Patient ID: Barry [name], male   DOB: [birth year], 58 y.o.   MRN: [id number]    Location:    [facility]   Place of Service:   OFFICE  Chief Complaint  Patient presents with  . Medical Management of Chronic Issues    1 mo F/U    HPI:  60 yo male seen today for f/u SOB. CT chest did not show sarcoidosis but had loss of lung volume on left. He continues to have wheezing and SOB when lying on left side. He has associated CP with wheezing. No orthopnea. He notices "rattling" when wearing CPAP. He is walking but after prolonged walking, he gets very SOB. He has changed lifestyle and is exercising more and eating healthier. He has an appt with pulm on 2/14th. He has seen Edison Pace for diabetes/insulin start and was started on Toujeo a few days ago. BS 160 fasting today. No readings >180. He checks BS TID. Occasional low BS reaction but resolves with eating.  Past Medical History  Diagnosis Date  . Coronary artery disease   . MI (myocardial infarction) (HCC) 04/23/2007    inferior wall  . Sleep apnea   . Morbid obesity (HCC) 06/26/2012  . S/P CABG x 3 07/04/2012    LIMA to LAD, SVG to D1, SVG to PDA, EVH via right thigh  . Unspecified essential hypertension   . Type II or unspecified type diabetes mellitus without mention of complication, not stated as uncontrolled     Past Surgical History  Procedure Laterality Date  . Coronary angioplasty with stent placement  04/23/2007    PCI and stenting of mid RCA - Dr Bary Castilla @ [facility]  . Coronary artery bypass graft N/A 07/04/2012    Procedure: CORONARY ARTERY BYPASS GRAFTING (CABG);  Surgeon: [name], MD;  Location: [facility] OR;  Service: Open Heart Surgery;  Laterality: N/A;  x3 using right greater saphenous vein and left internal mammary.   . Intraoperative transesophageal echocardiogram N/A 07/04/2012    Procedure: INTRAOPERATIVE TRANSESOPHAGEAL ECHOCARDIOGRAM;  Surgeon: [name], MD;  Location: [facility] OR;  Service: Open Heart Surgery;  Laterality: N/A;    . Left heart catheterization with coronary angiogram N/A 06/25/2012    Procedure: LEFT HEART CATHETERIZATION WITH CORONARY ANGIOGRAM;  Surgeon: [name], MD;  Location: [facility] CATH LAB;  Service: Cardiovascular;  Laterality: N/A;    Patient Care Team: Kirt Boys, DO as PCP - General (Internal Medicine) [name], MD as Consulting Physician (Cardiology)  Social History   Social History  . Marital Status: Divorced    Spouse Name: N/A  . Number of Children: 1  . Years of Education: N/A   Occupational History  . MAINTENANCE    Social History Main Topics  . Smoking status: Never Smoker   . Smokeless tobacco: Never Used  . Alcohol Use: No  . Drug Use: No  . Sexual Activity: Not on file   Other Topics Concern  . Not on file   Social History Narrative     reports that he has never smoked. He has never used smokeless tobacco. He reports that he does not drink alcohol or use illicit drugs.  No Known Allergies  Medications: Patient's Medications  New Prescriptions   No medications on file  Previous Medications   ACCU-CHEK AVIVA PLUS TEST STRIP    TEST three times a day   ALBUTEROL (PROVENTIL HFA;VENTOLIN HFA) 108 (90 BASE) MCG/ACT INHALER    One puff three times daily for  wheezing or shortness of breath   ASPIRIN 81 MG CHEWABLE TABLET    Chew 81 mg by mouth once.    ATORVASTATIN (LIPITOR) 80 MG TABLET    take 1 tablet by mouth once daily   CLOPIDOGREL (PLAVIX) 75 MG TABLET    take 1 tablet by mouth once daily   GLIPIZIDE (GLUCOTROL) 10 MG TABLET    take 1 tablet by mouth once daily   INSULIN GLARGINE (TOUJEO SOLOSTAR) 300 UNIT/ML SOPN    Inject 10 Units into the skin daily.   INSULIN PEN NEEDLE 32G X 4 MM MISC    1 each by Does not apply route daily.   LISINOPRIL (PRINIVIL,ZESTRIL) 5 MG TABLET    Take 1 tablet (5 mg total) by mouth daily.   METFORMIN (GLUCOPHAGE) 1000 MG TABLET    take 1 tablet by mouth twice a day for diabetes   METOPROLOL TARTRATE (LOPRESSOR)  25 MG TABLET    Take 0.5 tablets (12.5 mg total) by mouth 2 (two) times daily.   OMEPRAZOLE (PRILOSEC) 20 MG CAPSULE    take 1 tablet by mouth twice a day for 1 week then 1 tablet every morning ON AN EMPTY STOMACH   SERTRALINE (ZOLOFT) 100 MG TABLET    Take 1.5 tablets (150 mg total) by mouth at bedtime.   VIAGRA 100 MG TABLET    TAKE 1 TABLET BY MOUTH ONCE DAILY AS NEEDED   ZOLPIDEM (AMBIEN) 10 MG TABLET    Take one tablet by mouth at bedtime as needed for sleep  Modified Medications   Modified Medication Previous Medication   OXYCODONE-ACETAMINOPHEN (ROXICET) 5-325 MG TABLET oxyCODONE-acetaminophen (ROXICET) 5-325 MG tablet      Take 2 tablet in the morning and one tablet in the evening as needed for pain    Take 2 tablet in the morning and one tablet in the evening as needed for pain  Discontinued Medications   No medications on file    Review of Systems  Respiratory: Positive for shortness of breath and wheezing.   Cardiovascular: Positive for chest pain.  All other systems reviewed and are negative.   Filed Vitals:   02/19/15 1051  BP: 128/70  Pulse: 68  Temp: 98.3 F (36.8 C)  TempSrc: Oral  Resp: 20  Height:  (1.651 m)  Weight: 272 lb 3.2 oz (123.469 kg)  SpO2: 96%   Body mass index is 45.3 kg/(m^2).  Physical Exam  Constitutional: He appears well-developed and well-nourished. No distress.  Cardiovascular: Normal rate, regular rhythm and intact distal pulses.  Exam reveals no gallop and no friction rub.   No murmur heard. No LE edema b/l. No calf TTP  Pulmonary/Chest: He has wheezes (left base). He has no rhonchi. He has no rales. He exhibits tenderness and swelling. He exhibits no mass.    Musculoskeletal: He exhibits edema and tenderness.     Labs reviewed: Appointment on 01/13/2015  Component Date Value Ref Range Status  . Glucose 01/13/2015 153* 65 - 99 mg/dL Final  . BUN 40/98/1191 14  6 - 24 mg/dL Final  . Creatinine, Ser 01/13/2015 0.94  0.76 -  1.27 mg/dL Final  . GFR calc non Af Amer 01/13/2015 90  >59 mL/min/1.73 Final  . GFR calc Af Amer 01/13/2015 104  >59 mL/min/1.73 Final  . BUN/Creatinine Ratio 01/13/2015 15  9 - 20 Final  . Sodium 01/13/2015 138  134 - 144 mmol/L Final                 **  Please note reference interval change**  . Potassium 01/13/2015 4.6  3.5 - 5.2 mmol/L Final  . Chloride 01/13/2015 99  96 - 106 mmol/L Final                 **Please note reference interval change**  . CO2 01/13/2015 24  18 - 29 mmol/L Final  . Calcium 01/13/2015 9.1  8.7 - 10.2 mg/dL Final  . ALT 06/04/209 43  0 - 44 IU/L Final  . Cholesterol, Total 01/13/2015 113  100 - 199 mg/dL Final  . Triglycerides 01/13/2015 107  0 - 149 mg/dL Final  . HDL 17/35/6701 35* >39 mg/dL Final  . VLDL Cholesterol Cal 01/13/2015 21  5 - 40 mg/dL Final  . LDL Calculated 01/13/2015 57  0 - 99 mg/dL Final  . Chol/HDL Ratio 01/13/2015 3.2  0.0 - 5.0 ratio units Final   Comment:                                   T. Chol/HDL Ratio                                             Men  Women                               1/2 Avg.Risk  3.4    3.3                                   Avg.Risk  5.0    4.4                                2X Avg.Risk  9.6    7.1                                3X Avg.Risk 23.4   11.0   . Hgb A1c MFr Bld 01/13/2015 8.0* 4.8 - 5.6 % Final   Comment:          Pre-diabetes: 5.7 - 6.4          Diabetes: >6.4          Glycemic control for adults with diabetes: <7.0   . Est. average glucose Bld gHb Est-m* 01/13/2015 183   Final    Ct Chest Wo Contrast  02/07/2015  CLINICAL DATA:  Sarcoidosis. Shortness of breath. Left-sided chest pain. Prior CABG. EXAM: CT CHEST WITHOUT CONTRAST TECHNIQUE: Multidetector CT imaging of the chest was performed following the standard protocol without IV contrast. COMPARISON:  01/15/2015 FINDINGS: Mediastinum/Nodes: Prior CABG. Mild cardiomegaly. Coronary atherosclerosis. No pathologic thoracic adenopathy.  Lungs/Pleura: The peripheral scarring or atelectasis in the left lower lobe with adjacent fatty pleural thickening. No significant peribronchovascular or paraseptal nodularity. Upper abdomen: Unremarkable Musculoskeletal: Thoracic spondylosis. IMPRESSION: 1. No CT evidence of active sarcoid. 2. There is some peripheral volume loss in the left lower lobe due to atelectasis or scarring, with adjacent fatty thickening of the pleural space on the left, likely chronic. Both of these findings are new compared to 2009, although similar pleural  thickening was present back in 2014. 3. Mild cardiomegaly.  Coronary atherosclerosis.  Prior CABG. Electronically Signed   By: Gaylyn Rong M.D.   On: 02/07/2015 14:04     Assessment/Plan   ICD-9-CM ICD-10-CM   1. Shortness of breath 786.05 R06.02   2. Abnormal finding on chest xray 793.2 R93.8   3. Lumbosacral radiculopathy 724.4 M54.17 oxyCODONE-acetaminophen (ROXICET) 5-325 MG tablet  4. Morbid obesity due to excess calories (HCC) 278.01 E66.01 Basic Metabolic Panel     ALT  5. Muscle spasm 728.85 M62.838    left ACW  6. Hyperlipidemia LDL goal <70 272.4 E78.5 Lipid Panel   Apply warm compress as needed to left chest wall for muscle spasm  Continue other medications as ordered  Keep appointment with pulmonary as scheduled next month  Keep appointment with Edison Pace as scheduled for diabetes  Continue diet and exercise program  Follow up in 3 months for routine visit. Check fasting labs prior to appt   [facility] Ancil Linsey  [facility] [address] [place], [place] [address] [phone] Cell (Monday-Friday 8 AM - 5 PM) [phone] After 5 PM and follow prompts

## 2015-02-19 NOTE — Patient Instructions (Addendum)
Apply warm compress as needed to left chest wall for muscle spasm  Continue other medications as ordered  Keep appointment with pulmonary as scheduled next month  Keep appointment with Edison Pace as scheduled for diabetes  Continue diet and exercise program  Follow up in 3 months for routine visit. Check fasting labs prior to appt

## 2015-02-24 ENCOUNTER — Other Ambulatory Visit: Payer: Self-pay | Admitting: *Deleted

## 2015-02-24 MED ORDER — GLUCOSE BLOOD VI STRP: ORAL_STRIP | Status: DC

## 2015-02-24 NOTE — Telephone Encounter (Signed)
[facility] 

## 2015-03-10 ENCOUNTER — Other Ambulatory Visit: Payer: Self-pay | Admitting: *Deleted

## 2015-03-10 MED ORDER — ZOLPIDEM TARTRATE 10 MG PO TABS: ORAL_TABLET | ORAL | Status: DC

## 2015-03-10 NOTE — Telephone Encounter (Signed)
[facility] 

## 2015-03-11 ENCOUNTER — Encounter: Payer: Self-pay | Admitting: Pulmonary Disease

## 2015-03-11 ENCOUNTER — Ambulatory Visit (INDEPENDENT_AMBULATORY_CARE_PROVIDER_SITE_OTHER): Payer: BLUE CROSS/BLUE SHIELD | Admitting: Pulmonary Disease

## 2015-03-11 VITALS — BP 122/66 | HR 57 | Temp 97.9°F | Ht 65.0 in | Wt 271.0 lb

## 2015-03-11 DIAGNOSIS — G4733 Obstructive sleep apnea (adult) (pediatric): Secondary | ICD-10-CM

## 2015-03-11 DIAGNOSIS — E118 Type 2 diabetes mellitus with unspecified complications: Secondary | ICD-10-CM

## 2015-03-11 DIAGNOSIS — I10 Essential (primary) hypertension: Secondary | ICD-10-CM | POA: Diagnosis not present

## 2015-03-11 DIAGNOSIS — R0789 Other chest pain: Secondary | ICD-10-CM | POA: Diagnosis not present

## 2015-03-11 DIAGNOSIS — R0600 Dyspnea, unspecified: Secondary | ICD-10-CM | POA: Diagnosis not present

## 2015-03-11 DIAGNOSIS — Z951 Presence of aortocoronary bypass graft: Secondary | ICD-10-CM

## 2015-03-11 DIAGNOSIS — I2510 Atherosclerotic heart disease of native coronary artery without angina pectoris: Secondary | ICD-10-CM

## 2015-03-11 DIAGNOSIS — Z794 Long term (current) use of insulin: Secondary | ICD-10-CM

## 2015-03-11 DIAGNOSIS — F411 Generalized anxiety disorder: Secondary | ICD-10-CM

## 2015-03-11 NOTE — Patient Instructions (Signed)
Smauel-- it was a pleasure meeting you today....  Today we checked a spirometry breathing test, and an ambulatory oxygen saturation test... In addition we performed some follow up blood work...    We will contact you w/ the results when available...   Your shortness of breath appears to me to be MULTIFACTORIAL in etiology>    The main pulmonary issue appears to be lung RESTRICTION form being overweight- diet, exercise, & weight reduction are the only thing that will help this!    You are very active at work- so the main thing you need to do is DIET & GET YOUR WEIGHT DOWN... (I have seen this many times before- when your weight comes down, your breathing will likely improve).  Your CPAP machine needs to be checked by your DME company>    They should send [place] your machine data & settings, and a current DOWNLOAD to be sure it is working for you...    You need to replace your mask & tubing every 6 months...  Call for any questions...  Let's plan a follow up visit in 71mo, sooner if needed for problems.Marland KitchenMarland Kitchen

## 2015-03-11 NOTE — Progress Notes (Addendum)
Subjective:     Patient ID: Barry [name], male   DOB: Oct 15, 1955, 60 y.o.   MRN: [id number]  HPI ~  March 11, 2015:  Initial pulmonary evaluation by SN>      60 y/o WM, referred by [facility], for a pulmonary evaluation due to dyspnea>  Pt c/o SOB/DOE & some wheezing w/ activity- walking, stairs, etc; this has been present for some time now & persistent ?progressive; he denies cough, sput, hemoptysis, CP; Callis is a never-smoker; he has a hx signif heart problems w/ CAD & s/p CABG- he never did cardiac rehab after his surg & he is morbidly obese and too sedentary/ deconditioned; he works as a Armed forces training and education officer for [employer]- often has to walk up & down 3 flights...     Followed by DrClance for [facility]> last seen 10/2012- Dx in 2008 w/ severe OSA w/ sleep study showing AHI=69 events /hr, placed on CPAP w/ good response; f/u sleep study after his CABG showed AHI=77 events/hr w/ desat as low as 76%; he was asked to work aggressively on wt loss & was ordered a new machine w/ auto mode to optimize pressure (DME=[facility]); I cannot find any download reports and we will call them...     Followed for CARDS by DrBerry> last seen 03/2014- CAD, s/p CABG x3 6/14, HBP, HL; had some atypCP felt to be reflux- rec PPI rx; rec f/u 39yr...  Smoking Hx>  Never smoked...  Pulmonary Hx>  He was diagnosed w/ cutaneous sarcoidosis, no prior hx systemic or lung involvement; Hx OSA- on CPAP; prev hx bronchitis requiring antibiotics ~1/yr, no hx pneumonia/ asthma/ Tb or known exposure...  Medical Hx>  HBP, CAD- s/p CABG x3 in 2014, HL, IDDM, morbid obesity, anxiety- divorced 6/16...  Family Hx>  No family hx lung disease...   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  Current Meds>  AlbutHFA prn; ASA81, Plavix75, Metop25-1/2Bid, Lisin5, Lipitor80, Lantus/ Metformin/ Glucotrol, Prilosec, Zoloft, Ambien, Percocet...   EXAM shows Afeb, VSS, O2sat=97% on RA; 271#, 5'5"Tall, BMI=45;  HEENT- neg, mallampati3;  Chest- clear w/o w/r/r;  Heart- RR w/o m/r/g;  Abd- obese, soft, nontender;  Ext- neg w/o c/c/e;  Neuro- intact...  Last 2DEcho 03/29/14 showed mild LVH, norm LVF w/ EF=65-70% & no regional wall motion abn, norm valves x mild TR, PAsys~75mmHg  Last CXR 01/15/15 by DrCarter> heart at upper lim of norm, s/p edian sternotomy/ CABG, sl prom pulm vascularity, min blunting of left angle, NSD; DDD noted...  CTChest 02/07/15 ordered by DrCarter> mild cardiomeg, s/p CABG, coronary atherosclerosis, scarring vs atx at left base w/ fatty pleural thickening, NAD (no evid for sarcoisosis), no adenopathy, +thoracic spondylosis...  Spirometry 03/11/15>  FVC=3.34 (83%), FEV1=2.56 (79%), %1sec=77, mid-flows are mildly reduced at 65% predicted;  This shows essentially normal airflow x for mild small airways dis...  Ambulatory oximetry 03/11/15>  O2sat=96% on RA at rest w/ pulse=57/min;  He ambulated 3Laps in the office w/ lowest O2sat=94% w/ pulse=82/min...  LABS in EPIC reviewed>  Chems 12/16 were OK w/ BS=153, A1c=8.0;  Last CBC- wnl;  Last TFT- wnl...  HE DID NOT GO TO THE LAB FOR ACE level as requested 03/11/15 OV...  IMP/PLAN>>     DYSPNEA> this is clearly multifactorial w/ components from his heart dis, sedentary/ deconditioned, obesity, OSA, anxiety-- the main intervention of benefit for him will be DIET/ EXERCISE/ Wt REDUCTION; he may also benefit from stress reduction/ anxiolytic therapy.Marland KitchenMarland Kitchen  He is a never smoker w/o hx of any significant lung problems> he does not have any sign of pulm sarcoidosis...     OSA> on CPAP, he has not had any follow up & we will endeavor to get CPAP download data from [facility] (he says he hasn't had tubing changed in 2+yrs!)    Morbid Obesity> he weighs 271#, 5'5" Tall, BMI=45> we reviewed diet, exercise, wt reduction...     CARDIAC Hx>  HBP, CAD- s/p CABG x3, HL-- followed by DrBerry    MEDICAL Hx> IDDM, morbid obesity, anxiety-- followed by  DrCarter    Past Medical History  Diagnosis Date  . Coronary artery disease   . MI (myocardial infarction) (HCC) 04/23/2007    inferior wall  . Sleep apnea   . Morbid obesity (HCC) 06/26/2012  . S/P CABG x 3 07/04/2012    LIMA to LAD, SVG to D1, SVG to PDA, EVH via right thigh  . Unspecified essential hypertension   . Type II or unspecified type diabetes mellitus without mention of complication, not stated as uncontrolled     Past Surgical History  Procedure Laterality Date  . Coronary angioplasty with stent placement  04/23/2007    PCI and stenting of mid RCA - Dr Bary Castilla @ [facility]  . Coronary artery bypass graft N/A 07/04/2012    Procedure: CORONARY ARTERY BYPASS GRAFTING (CABG);  Surgeon: [name], MD;  Location: [facility] OR;  Service: Open Heart Surgery;  Laterality: N/A;  x3 using right greater saphenous vein and left internal mammary.   . Intraoperative transesophageal echocardiogram N/A 07/04/2012    Procedure: INTRAOPERATIVE TRANSESOPHAGEAL ECHOCARDIOGRAM;  Surgeon: [name], MD;  Location[facility] OR;  Service: Open Heart Surgery;  Laterality: N/A;  . Left heart catheterization with coronary angiogram N/A 06/25/2012    Procedure: LEFT HEART CATHETERIZATION WITH CORONARY ANGIOGRAM;  Surgeon: [name], MD;  Location: [facility] CATH LAB;  Service: Cardiovascular;  Laterality: N/A;    Outpatient Encounter Prescriptions as of 03/11/2015  Medication Sig  . albuterol (PROVENTIL HFA;VENTOLIN HFA) 108 (90 BASE) MCG/ACT inhaler One puff three times daily for wheezing or shortness of breath  . aspirin 81 MG chewable tablet Chew 81 mg by mouth once.   Marland Kitchen atorvastatin (LIPITOR) 80 MG tablet take 1 tablet by mouth once daily  . clopidogrel (PLAVIX) 75 MG tablet take 1 tablet by mouth once daily  . glipiZIDE (GLUCOTROL) 10 MG tablet take 1 tablet by mouth once daily  . glucose blood (ACCU-CHEK AVIVA PLUS) test strip Use to test blood sugar three times daily  . Insulin Glargine (TOUJEO SOLOSTAR)  300 UNIT/ML SOPN Inject 10 Units into the skin daily.  . Insulin Pen Needle 32G X 4 MM MISC 1 each by Does not apply route daily.  Marland Kitchen lisinopril (PRINIVIL,ZESTRIL) 5 MG tablet Take 1 tablet (5 mg total) by mouth daily.  . metFORMIN (GLUCOPHAGE) 1000 MG tablet take 1 tablet by mouth twice a day for diabetes  . metoprolol tartrate (LOPRESSOR) 25 MG tablet Take 0.5 tablets (12.5 mg total) by mouth 2 (two) times daily.  Marland Kitchen omeprazole (PRILOSEC) 20 MG capsule take 1 tablet by mouth twice a day for 1 week then 1 tablet every morning ON AN EMPTY STOMACH  . oxyCODONE-acetaminophen (ROXICET) 5-325 MG tablet Take 2 tablet in the morning and one tablet in the evening as needed for pain  . sertraline (ZOLOFT) 100 MG tablet Take 1.5 tablets (150 mg total) by mouth at bedtime.  Marland Kitchen VIAGRA  100 MG tablet TAKE 1 TABLET BY MOUTH ONCE DAILY AS NEEDED  . zolpidem (AMBIEN) 10 MG tablet Take one tablet by mouth at bedtime as needed for sleep   No facility-administered encounter medications on file as of 03/11/2015.    No Known Allergies   Immunization History  Administered Date(s) Administered  . Influenza,inj,Quad PF,36+ Mos 10/10/2012, 11/21/2013, 10/16/2014  . Pneumococcal Polysaccharide-23 09/05/2012  . Tdap 05/08/2014    Family History  Problem Relation Age of Onset  . Cancer Mother   . Diabetes Sister   . Diabetes Brother     Social History   Social History  . Marital Status: Divorced    Spouse Name: N/A  . Number of Children: 1  . Years of Education: N/A   Occupational History  . MAINTENANCE    Social History Main Topics  . Smoking status: Never Smoker   . Smokeless tobacco: Never Used  . Alcohol Use: No  . Drug Use: No  . Sexual Activity: Not on file   Other Topics Concern  . Not on file   Social History Narrative    Current Medications, Allergies, Past Medical History, Past Surgical History, Family History, and Social History were reviewed in [employer]  record.   Review of Systems             All symptoms NEG except where BOLDED >>  Constitutional:  F/C/S, fatigue, anorexia, unexpected weight change. HEENT:  HA, visual changes, hearing loss, earache, nasal symptoms, sore throat, mouth sores, hoarseness. Resp:  cough, sputum, hemoptysis; SOB, tightness, wheezing. Cardio:  CP, palpit, DOE, orthopnea, edema. GI:  N/V/D/C, blood in stool; reflux, abd pain, distention, gas. GU:  dysuria, freq, urgency, hematuria, flank pain, voiding difficulty. MS:  joint pain, swelling, tenderness, decr ROM; neck pain, back pain, etc. Neuro:  HA, tremors, seizures, dizziness, syncope, weakness, numbness, gait abn. Skin:  suspicious lesions or skin rash. Heme:  adenopathy, bruising, bleeding. Psyche:  confusion, agitation, sleep disturbance, hallucinations, anxiety, depression suicidal.   Objective:   Physical Exam       Vital Signs:  Reviewed...  General:  WD, morbidly obese, 60 y/o WM in NAD; alert & oriented; pleasant & cooperative... HEENT:  Magna/AT; Conjunctiva- pink, Sclera- nonicteric, EOM-wnl, PERRLA, Fundi-benign; EACs-clear, TMs-wnl; NOSE-clear; THROAT-clear & wnl. Neck:  Supple w/ fair ROM; no JVD; normal carotid impulses w/o bruits; no thyromegaly or nodules palpated; no lymphadenopathy. Chest:  Clear to P & A; without wheezes, rales, or rhonchi heard. Heart:  s/p CABG, Regular Rhythm; norm S1 & S2 without murmurs, rubs, or gallops detected. Abdomen:  Obese, soft & nontender- no guarding or rebound; normal bowel sounds; no organomegaly or masses palpated. Ext:  Normal ROM; without deformities or arthritic changes; no varicose veins, venous insuffic, tr edema;  Pulses intact w/o bruits. Neuro:  CNs II-XII intact; motor testing normal; sensory testing normal; gait normal & balance OK. Derm:  No lesions noted; no rash etc. Lymph:  No cervical, supraclavicular, axillary, or inguinal adenopathy palpated.   Assessment:      IMP/PLAN>>      DYSPNEA> this is clearly multifactorial w/ components from his heart dis, sedentary/ deconditioned, obesity, OSA, anxiety-- the main intervention of benefit for him will be DIET/ EXERCISE/ Wt REDUCTION; he may also benefit from stress reduction/ anxiolytic therapy...    He is a never smoker w/o hx of any significant lung problems> he does not have any sign of pulm sarcoidosis...     OSA> on CPAP,  he has not had any follow up & we will endeavor to get CPAP download data from [facility] (he says he hasn't had tubing changed in 2+yrs!)    Morbid Obesity> he weighs 271#, 5'5" Tall, BMI=45> we reviewed diet, exercise, wt reduction...     CARDIAC Hx>  HBP, CAD- s/p CABG x3, HL-- followed by DrBerry    MEDICAL Hx> IDDM, morbid obesity, anxiety-- followed by DrCarter     Plan:     Patient's Medications  New Prescriptions   No medications on file  Previous Medications   ALBUTEROL (PROVENTIL HFA;VENTOLIN HFA) 108 (90 BASE) MCG/ACT INHALER    One puff three times daily for wheezing or shortness of breath   ASPIRIN 81 MG CHEWABLE TABLET    Chew 81 mg by mouth once.    ATORVASTATIN (LIPITOR) 80 MG TABLET    take 1 tablet by mouth once daily   CLOPIDOGREL (PLAVIX) 75 MG TABLET    take 1 tablet by mouth once daily   GLIPIZIDE (GLUCOTROL) 10 MG TABLET    take 1 tablet by mouth once daily   GLUCOSE BLOOD (ACCU-CHEK AVIVA PLUS) TEST STRIP    Use to test blood sugar three times daily   INSULIN GLARGINE (TOUJEO SOLOSTAR) 300 UNIT/ML SOPN    Inject 10 Units into the skin daily.   INSULIN PEN NEEDLE 32G X 4 MM MISC    1 each by Does not apply route daily.   LISINOPRIL (PRINIVIL,ZESTRIL) 5 MG TABLET    Take 1 tablet (5 mg total) by mouth daily.   METFORMIN (GLUCOPHAGE) 1000 MG TABLET    take 1 tablet by mouth twice a day for diabetes   METOPROLOL TARTRATE (LOPRESSOR) 25 MG TABLET    Take 0.5 tablets (12.5 mg total) by mouth 2 (two) times daily.   OMEPRAZOLE (PRILOSEC) 20 MG CAPSULE    take 1 tablet by mouth twice a day  for 1 week then 1 tablet every morning ON AN EMPTY STOMACH   OXYCODONE-ACETAMINOPHEN (ROXICET) 5-325 MG TABLET    Take 2 tablet in the morning and one tablet in the evening as needed for pain   SERTRALINE (ZOLOFT) 100 MG TABLET    Take 1.5 tablets (150 mg total) by mouth at bedtime.   VIAGRA 100 MG TABLET    TAKE 1 TABLET BY MOUTH ONCE DAILY AS NEEDED   ZOLPIDEM (AMBIEN) 10 MG TABLET    Take one tablet by mouth at bedtime as needed for sleep  Modified Medications   No medications on file  Discontinued Medications   No medications on file

## 2015-03-31 ENCOUNTER — Encounter: Payer: Self-pay | Admitting: Pharmacotherapy

## 2015-03-31 ENCOUNTER — Ambulatory Visit (INDEPENDENT_AMBULATORY_CARE_PROVIDER_SITE_OTHER): Payer: BLUE CROSS/BLUE SHIELD | Admitting: Pharmacotherapy

## 2015-03-31 VITALS — BP 110/78 | HR 61 | Temp 98.0°F | Resp 20 | Ht 65.0 in | Wt 264.8 lb

## 2015-03-31 DIAGNOSIS — E1165 Type 2 diabetes mellitus with hyperglycemia: Secondary | ICD-10-CM | POA: Diagnosis not present

## 2015-03-31 DIAGNOSIS — M5417 Radiculopathy, lumbosacral region: Secondary | ICD-10-CM

## 2015-03-31 DIAGNOSIS — Z794 Long term (current) use of insulin: Secondary | ICD-10-CM | POA: Diagnosis not present

## 2015-03-31 DIAGNOSIS — I10 Essential (primary) hypertension: Secondary | ICD-10-CM | POA: Diagnosis not present

## 2015-03-31 DIAGNOSIS — E1122 Type 2 diabetes mellitus with diabetic chronic kidney disease: Secondary | ICD-10-CM | POA: Diagnosis not present

## 2015-03-31 MED ORDER — OXYCODONE-ACETAMINOPHEN 5-325 MG PO TABS: ORAL_TABLET | ORAL | Status: DC

## 2015-03-31 MED ORDER — INSULIN GLARGINE 300 UNIT/ML ~~LOC~~ SOPN: 12.0000 [IU] | PEN_INJECTOR | Freq: Every day | SUBCUTANEOUS | Status: DC

## 2015-03-31 NOTE — Patient Instructions (Signed)
Increase Toujeo to 12 units daily Challenge yourself to get 10,000 steps per day.

## 2015-03-31 NOTE — Progress Notes (Signed)
  Subjective:    Barry [name] is a 60 y.o. white  male who presents for follow-up of Type 2 diabetes mellitus.   Started Toujeo 10 units daily 6 weeks ago. Denies missed doses.  He has been making better food choices.  Quit drinking soda.  Drinking more water. Walking daily for exercise. Plans to start riding exercise bike. Wears glasses.  Denies problems with vision Denies problems with feet Denies peripheral edema. Some peripheral edema.  Did not bring meter or logbook. Lowest BG 105mg /dl Highest BG 175mg /dl Average BG:  145mg /dl  He feels better.  Says he has more energy.    Review of Systems A comprehensive review of systems was negative except for: Eyes: positive for contacts/glasses Cardiovascular: positive for lower extremity edema Genitourinary: positive for nocturia    Objective:    BP 110/78 mmHg  Pulse 61  Temp(Src) 98 F (36.7 C) (Oral)  Resp 20  Ht 5\' 5"  (1.651 m)  Wt 264 lb 12.8 oz (120.112 kg)  BMI 44.06 kg/m2  SpO2 96%  General:  alert, cooperative and no distress  Oropharynx: normal findings: lips normal without lesions and gums healthy   Eyes:  negative findings: lids and lashes normal and conjunctivae and sclerae normal   Ears:  external ears normal        Lung: clear to auscultation bilaterally  Heart:  regular rate and rhythm     Extremities: edema bilateral trace edema  Skin: warm and dry, no hyperpigmentation, vitiligo, or suspicious lesions     Neuro: mental status, speech normal, alert and oriented x3 and gait and station normal   Lab Review GLUCOSE (mg/dL)  Date Value  31/54/0086 153*  10/16/2014 122*  07/16/2014 138*   GLUCOSE, BLD (mg/dL)  Date Value  76/19/5093 196*  07/05/2012 127*  07/04/2012 143*   CO2 (mmol/L)  Date Value  01/13/2015 24  10/16/2014 25  07/16/2014 23   BUN (mg/dL)  Date Value  26/71/2458 14  10/16/2014 14  07/16/2014 13  07/06/2012 12  07/05/2012 9  07/04/2012 9   CREATININE, SER (mg/dL)   Date Value  09/98/3382 0.94  10/16/2014 0.90  07/16/2014 1.04       Assessment:    Diabetes Mellitus type II, under fair control.  A1C above goal, but fasting BG improving. BP at goal <140/90   Plan:    1.  Rx changes: Increase Toujeo 12 units daily. 2.  Continue metformin and glipizide. 3.  Praised diet changes.  Counseled on nutrition goals and snack choices. 4.  Counseled on benefit of routine exercise.  Goal is 30-45 minutes 5 x week. 5.  BP at goal <140/90 6.  RTC in 8 weeks.  Will recheck A1C.

## 2015-04-04 ENCOUNTER — Other Ambulatory Visit: Payer: Self-pay | Admitting: Internal Medicine

## 2015-04-07 ENCOUNTER — Telehealth: Payer: Self-pay | Admitting: Pulmonary Disease

## 2015-04-07 DIAGNOSIS — G4733 Obstructive sleep apnea (adult) (pediatric): Secondary | ICD-10-CM

## 2015-04-07 NOTE — Telephone Encounter (Addendum)
Per SN:  We never got a response from [facility] about pt's CPAP, pt never received tubing or mask and we never received the DL.  Called pt. Pt states he received a call from [facility] stating because he has not yet met his deductible he will have to pay $175 out of pocket for his mask. Pt states he is unable to afford this. Spoke with Dawn, [facility], and was advised that pt can either get a mask fitting and possible receive a mask at the fitting or pay for a mask online. Will discuss with Dr. Lenna Gilford, then call pt with the options.

## 2015-04-12 ENCOUNTER — Other Ambulatory Visit: Payer: Self-pay | Admitting: Internal Medicine

## 2015-04-14 ENCOUNTER — Other Ambulatory Visit: Payer: Self-pay | Admitting: Internal Medicine

## 2015-04-18 ENCOUNTER — Other Ambulatory Visit: Payer: Self-pay | Admitting: Internal Medicine

## 2015-04-25 NOTE — Telephone Encounter (Signed)
Will discuss with SN on Monday 4/3.

## 2015-04-28 NOTE — Telephone Encounter (Signed)
Spoke with SN: Ok for pt to do mask fitting, anything to get pt to the mask. Will also need to obtain a DL from [facility].   Called and spoke to pt. Informed him of the mask fitting at [facility] and how they may give pt the mask they fit him with. Advised pt to call back if they do not give pt the mask so we can find an alternative option to get pt a CPAP mask. Called [facility] and spoke to [place] and was advised the pt will need to bring in CPAP to get download. Order placed for download. Pt verbalized understanding and denied any further questions or concerns at this time.

## 2015-05-07 ENCOUNTER — Other Ambulatory Visit: Payer: Self-pay | Admitting: *Deleted

## 2015-05-07 DIAGNOSIS — M5417 Radiculopathy, lumbosacral region: Secondary | ICD-10-CM

## 2015-05-07 MED ORDER — OXYCODONE-ACETAMINOPHEN 5-325 MG PO TABS: ORAL_TABLET | ORAL | Status: DC

## 2015-05-09 ENCOUNTER — Other Ambulatory Visit: Payer: Self-pay | Admitting: Internal Medicine

## 2015-05-12 ENCOUNTER — Other Ambulatory Visit: Payer: Self-pay | Admitting: Internal Medicine

## 2015-05-12 ENCOUNTER — Other Ambulatory Visit: Payer: Self-pay

## 2015-05-12 MED ORDER — GLUCOSE BLOOD VI STRP: ORAL_STRIP | Status: DC

## 2015-05-14 ENCOUNTER — Other Ambulatory Visit (HOSPITAL_BASED_OUTPATIENT_CLINIC_OR_DEPARTMENT_OTHER): Payer: BLUE CROSS/BLUE SHIELD | Admitting: Radiology

## 2015-05-19 ENCOUNTER — Other Ambulatory Visit: Payer: Self-pay | Admitting: Internal Medicine

## 2015-05-21 ENCOUNTER — Other Ambulatory Visit: Payer: BLUE CROSS/BLUE SHIELD

## 2015-05-26 ENCOUNTER — Ambulatory Visit: Payer: BLUE CROSS/BLUE SHIELD | Admitting: Pharmacotherapy

## 2015-05-30 ENCOUNTER — Other Ambulatory Visit: Payer: BLUE CROSS/BLUE SHIELD

## 2015-05-30 DIAGNOSIS — E1122 Type 2 diabetes mellitus with diabetic chronic kidney disease: Secondary | ICD-10-CM

## 2015-05-30 DIAGNOSIS — Z794 Long term (current) use of insulin: Principal | ICD-10-CM

## 2015-05-30 DIAGNOSIS — E1165 Type 2 diabetes mellitus with hyperglycemia: Principal | ICD-10-CM

## 2015-05-30 DIAGNOSIS — E785 Hyperlipidemia, unspecified: Secondary | ICD-10-CM

## 2015-05-31 LAB — LIPID PANEL
CHOL/HDL RATIO: 3.5 ratio (ref 0.0–5.0)
Cholesterol, Total: 116 mg/dL (ref 100–199)
HDL: 33 mg/dL — ABNORMAL LOW (ref >39)
LDL CALC: 63 mg/dL (ref 0–99)
TRIGLYCERIDES: 102 mg/dL (ref 0–149)
VLDL Cholesterol Cal: 20 mg/dL (ref 5–40)

## 2015-05-31 LAB — COMPREHENSIVE METABOLIC PANEL
ALT: 117 IU/L — ABNORMAL HIGH (ref 0–44)
AST: 98 IU/L — ABNORMAL HIGH (ref 0–40)
Albumin/Globulin Ratio: 1.4 (ref 1.2–2.2)
Albumin: 4.3 g/dL (ref 3.5–5.5)
Alkaline Phosphatase: 104 IU/L (ref 39–117)
BUN/Creatinine Ratio: 22 — ABNORMAL HIGH (ref 9–20)
BUN: 20 mg/dL (ref 6–24)
Bilirubin Total: 0.5 mg/dL (ref 0.0–1.2)
CO2: 25 mmol/L (ref 18–29)
Calcium: 9.2 mg/dL (ref 8.7–10.2)
Chloride: 97 mmol/L (ref 96–106)
Creatinine, Ser: 0.93 mg/dL (ref 0.76–1.27)
GFR calc Af Amer: 104 mL/min/{1.73_m2} (ref >59)
GFR calc non Af Amer: 90 mL/min/{1.73_m2} (ref >59)
Globulin, Total: 3 g/dL (ref 1.5–4.5)
Glucose: 116 mg/dL — ABNORMAL HIGH (ref 65–99)
Potassium: 4.4 mmol/L (ref 3.5–5.2)
Sodium: 137 mmol/L (ref 134–144)
Total Protein: 7.3 g/dL (ref 6.0–8.5)

## 2015-05-31 LAB — HEMOGLOBIN A1C
Est. average glucose Bld gHb Est-mCnc: 148 mg/dL
Hgb A1c MFr Bld: 6.8 % — ABNORMAL HIGH (ref 4.8–5.6)

## 2015-06-02 ENCOUNTER — Ambulatory Visit (INDEPENDENT_AMBULATORY_CARE_PROVIDER_SITE_OTHER): Payer: BLUE CROSS/BLUE SHIELD | Admitting: Pharmacotherapy

## 2015-06-02 ENCOUNTER — Encounter: Payer: Self-pay | Admitting: Pharmacotherapy

## 2015-06-02 VITALS — BP 112/72 | HR 60 | Temp 98.6°F | Ht 65.0 in | Wt 259.0 lb

## 2015-06-02 DIAGNOSIS — E785 Hyperlipidemia, unspecified: Secondary | ICD-10-CM | POA: Diagnosis not present

## 2015-06-02 DIAGNOSIS — Z794 Long term (current) use of insulin: Secondary | ICD-10-CM

## 2015-06-02 DIAGNOSIS — E1122 Type 2 diabetes mellitus with diabetic chronic kidney disease: Secondary | ICD-10-CM | POA: Diagnosis not present

## 2015-06-02 DIAGNOSIS — E1165 Type 2 diabetes mellitus with hyperglycemia: Secondary | ICD-10-CM

## 2015-06-02 DIAGNOSIS — I10 Essential (primary) hypertension: Secondary | ICD-10-CM

## 2015-06-02 NOTE — Patient Instructions (Signed)
Do not take atorvastatin until you follow up with Dr. Montez Morita later this month. Repeat liver function test in 2 weeks.

## 2015-06-02 NOTE — Progress Notes (Signed)
  Subjective:    Barry [name] is a 60 y.o.white male who presents for follow-up of Type 2 diabetes mellitus.   A1C much improved from 8.0% to 6.8% GFR is 62ml/min However, LFT have had a steady increase since 2015 -  Now above normal limits.  ALT 117  He reports his blood glucose are good. Admits to a couple of hypoglycemia episodes - lowest BG was 45mg /dl.  Occurred during work. Reports fasting average BG:  120-130mg /dl Still snacks - but making healthy choices. Has cut portion sizes. Trying to do more physical activity. Has cut back on sodas - now drinking flavored water.  Wears glasses.  Eye exam due. Some blurry vision.  Only has 1 eye (left) Denies problems with feet. Some leg cramps. Denies peripheral edema. Nocturia at least twice per night.  Denies any alcohol Denies use of Tylenol.  However, takes 2 oxycodone/APAP twice daily. (1300mg  acetaminophen) Has been on atorvastatin since 2014. Denies any GI distress, no diarrhea, constipation, abdominal pain.  Review of Systems A comprehensive review of systems was negative except for: Eyes: positive for contacts/glasses Genitourinary: positive for nocturia Musculoskeletal: positive for leg cramps Endocrine: positive for diabetic symptoms including blurry vision    Objective:    BP 112/72 mmHg  Pulse 60  Temp(Src) 98.6 F (37 C) (Oral)  Ht 5\' 5"  (1.651 m)  Wt 259 lb (117.482 kg)  BMI 43.10 kg/m2  SpO2 97%  General:  alert, cooperative and no distress  Oropharynx: normal findings: lips normal without lesions and gums healthy   Eyes:  negative findings: lids and lashes normal and conjunctivae and sclerae normal   Ears:  external ears normal        Lung: clear to auscultation bilaterally  Heart:  regular rate and rhythm     Extremities: extremities normal, atraumatic, no cyanosis or edema  Skin: warm and dry, no hyperpigmentation, vitiligo, or suspicious lesions     Neuro: mental status, speech normal, alert and  oriented x3 and gait and station normal   Lab Review GLUCOSE (mg/dL)  Date Value  02/54/2706 116*  01/13/2015 153*  10/16/2014 122*   GLUCOSE, BLD (mg/dL)  Date Value  23/76/2831 196*  07/05/2012 127*  07/04/2012 143*   CO2 (mmol/L)  Date Value  05/30/2015 25  01/13/2015 24  10/16/2014 25   BUN (mg/dL)  Date Value  51/76/1607 20  01/13/2015 14  10/16/2014 14  07/06/2012 12  07/05/2012 9  07/04/2012 9   CREATININE, SER (mg/dL)  Date Value  37/10/6267 0.93  01/13/2015 0.94  10/16/2014 0.90       Assessment:    Diabetes Mellitus type II, under excellent control. A1C at goal <7% BP at goal <140/90 LDL at goal <70 LFT elevated, progressive since 2015   Plan:    1.  Rx changes: stop atorvastatin for now due to elevated LFT.  He will repeat LFT in 2 weeks prior to his scheduled OV with Dr Montez Morita.  2.  Continue Toujeo 12 units daily. 3.  Continue Metformin and glipizide. 4.  Counseled on nutrition goals. 5.  Exercise goal is 30-45 minutes 5 x week. 6.  BP at goal <140/90 7.  Weight continues to decline. 2.

## 2015-06-03 ENCOUNTER — Telehealth: Payer: Self-pay

## 2015-06-03 ENCOUNTER — Other Ambulatory Visit: Payer: Self-pay | Admitting: *Deleted

## 2015-06-03 ENCOUNTER — Other Ambulatory Visit: Payer: Self-pay

## 2015-06-03 DIAGNOSIS — R748 Abnormal levels of other serum enzymes: Secondary | ICD-10-CM

## 2015-06-03 MED ORDER — GLUCOSE BLOOD VI STRP: ORAL_STRIP | Status: DC

## 2015-06-03 MED ORDER — ZOLPIDEM TARTRATE 10 MG PO TABS: ORAL_TABLET | ORAL | Status: DC

## 2015-06-03 NOTE — Telephone Encounter (Signed)
Prior authorization was received for Accu-chek Aviva Plus Test Strips. Prior authorization was initiated via [email or web address]. Keyword: [id number].   Awaiting determination.

## 2015-06-03 NOTE — Telephone Encounter (Signed)
Patient requested refills on Test Strips and Ambien.

## 2015-06-09 ENCOUNTER — Ambulatory Visit: Payer: BLUE CROSS/BLUE SHIELD | Admitting: Pulmonary Disease

## 2015-06-11 ENCOUNTER — Other Ambulatory Visit: Payer: Self-pay

## 2015-06-11 DIAGNOSIS — M5417 Radiculopathy, lumbosacral region: Secondary | ICD-10-CM

## 2015-06-11 MED ORDER — BAYER CONTOUR NEXT MONITOR W/DEVICE KIT: PACK | Status: DC

## 2015-06-11 MED ORDER — OXYCODONE-ACETAMINOPHEN 5-325 MG PO TABS: ORAL_TABLET | ORAL | Status: DC

## 2015-06-11 MED ORDER — GLUCOSE BLOOD VI STRP: ORAL_STRIP | Status: DC

## 2015-06-11 NOTE — Telephone Encounter (Signed)
Patient called requesting a refill on Oxycodone (will pick-up Friday) and to see what's the status on his request for DM Testing Supplies.   I called patient's pharmacy to see if we need to do anything additional to get patient's DM testing supplies dispensed. Per the pharmacist patient's insurance company no longer covers Accu-chek and we will need to change to an alternative. I called the patient to inform him that I will call the insurance company to get an alternative.   I called [phone] to inquire what machine will have coverage. [employer] Contour Next is on patient's formulary. RX sent for new supplies

## 2015-06-13 ENCOUNTER — Ambulatory Visit
Admission: RE | Admit: 2015-06-13 | Discharge: 2015-06-13 | Disposition: A | Discharge status: Home / Self Care | Payer: BLUE CROSS/BLUE SHIELD | Source: Ambulatory Visit | Attending: Internal Medicine | Admitting: Internal Medicine

## 2015-06-13 DIAGNOSIS — R748 Abnormal levels of other serum enzymes: Secondary | ICD-10-CM

## 2015-06-16 ENCOUNTER — Ambulatory Visit: Payer: BLUE CROSS/BLUE SHIELD | Admitting: Pulmonary Disease

## 2015-06-18 ENCOUNTER — Other Ambulatory Visit: Payer: BLUE CROSS/BLUE SHIELD

## 2015-06-19 LAB — BASIC METABOLIC PANEL
BUN / CREAT RATIO: 19 (ref 9–20)
BUN: 17 mg/dL (ref 6–24)
CO2: 24 mmol/L (ref 18–29)
Calcium: 9.3 mg/dL (ref 8.7–10.2)
Chloride: 101 mmol/L (ref 96–106)
Creatinine, Ser: 0.89 mg/dL (ref 0.76–1.27)
GFR calc Af Amer: 109 mL/min/{1.73_m2} (ref >59)
GFR, EST NON AFRICAN AMERICAN: 94 mL/min/{1.73_m2} (ref >59)
GLUCOSE: 111 mg/dL — AB (ref 65–99)
POTASSIUM: 4.9 mmol/L (ref 3.5–5.2)
SODIUM: 141 mmol/L (ref 134–144)

## 2015-06-19 LAB — ALT: ALT: 112 IU/L — AB (ref 0–44)

## 2015-06-20 ENCOUNTER — Ambulatory Visit (INDEPENDENT_AMBULATORY_CARE_PROVIDER_SITE_OTHER): Payer: BLUE CROSS/BLUE SHIELD | Admitting: Internal Medicine

## 2015-06-20 ENCOUNTER — Encounter: Payer: Self-pay | Admitting: Internal Medicine

## 2015-06-20 VITALS — BP 132/70 | HR 60 | Temp 98.2°F | Ht 65.0 in | Wt 265.0 lb

## 2015-06-20 DIAGNOSIS — F418 Other specified anxiety disorders: Secondary | ICD-10-CM

## 2015-06-20 DIAGNOSIS — Z794 Long term (current) use of insulin: Secondary | ICD-10-CM | POA: Diagnosis not present

## 2015-06-20 DIAGNOSIS — E1140 Type 2 diabetes mellitus with diabetic neuropathy, unspecified: Secondary | ICD-10-CM | POA: Diagnosis not present

## 2015-06-20 DIAGNOSIS — E785 Hyperlipidemia, unspecified: Secondary | ICD-10-CM

## 2015-06-20 DIAGNOSIS — G4700 Insomnia, unspecified: Secondary | ICD-10-CM | POA: Diagnosis not present

## 2015-06-20 DIAGNOSIS — R748 Abnormal levels of other serum enzymes: Secondary | ICD-10-CM

## 2015-06-20 DIAGNOSIS — I10 Essential (primary) hypertension: Secondary | ICD-10-CM

## 2015-06-20 DIAGNOSIS — G4733 Obstructive sleep apnea (adult) (pediatric): Secondary | ICD-10-CM | POA: Diagnosis not present

## 2015-06-20 MED ORDER — SERTRALINE HCL 100 MG PO TABS: 150.0000 mg | ORAL_TABLET | Freq: Every day | ORAL | Status: DC

## 2015-06-20 MED ORDER — BUSPIRONE HCL 15 MG PO TABS: 15.0000 mg | ORAL_TABLET | Freq: Two times a day (BID) | ORAL | Status: DC

## 2015-06-20 NOTE — Patient Instructions (Addendum)
Start buspar 2 times daily for anxiety  STOP lipitor  Continue other medications as ordered  Follow up in 3 mos for CPE. Keep appt with Edison Pace as scheduled. Fasting labs prior to appt

## 2015-06-20 NOTE — Progress Notes (Signed)
Patient ID: Barry [name], male   DOB: 12/07/1955, 60 y.o.   MRN: [id number]    Location:  [facility] Place of Service: OFFICE  Chief Complaint  Patient presents with  . Medical Management of Chronic Issues    3 month Follow up, discuss labs (copy printed)    HPI: 60 yo male seen today for f/u. He saw Cathey Miller for diabetes earlier this month. lipitor stopped due to elevated liver enzymes. He maintains healthy diet and is slowly losing weight. Activity level about the same. He feels really tired and has had to take a nap in the middle of the day at times. Recent ALT 112 (<--117).   He has breakthrough anxiety/worrying on sertraline 150mg qhs. He does not sleep well even with ambien.   HTN - stable on lisinopril and lopressor  DM - BS checked fasting and range 100-140. Occasional low BS reactions  (weak/dizziness/shaking) relieved with eating and/or drinking. He takes toujeo 12 units daily, glipizide, metformin. A1c 6.8% now  Hyperlipidemia - takes statin qhs.  Knee pain - stable on prn percocet. He has b/l knee pain. He has pain in his ankles at night when he tries to cross them. He states "my whole body hurts"  CAD - stable. No CP. Occasional SOB. Takes BB, ACEI and plavix. He saw cardio in March and was told he was stable and to f/u in 1 yr  Sleep apnea - stable on CPAP. He has rash on face due to mask  Past Medical History  Diagnosis Date  . Coronary artery disease   . MI (myocardial infarction) (HCC) 04/23/2007    inferior wall  . Sleep apnea   . Morbid obesity (HCC) 06/26/2012  . S/P CABG x 3 07/04/2012    LIMA to LAD, SVG to D1, SVG to PDA, EVH via right thigh  . Unspecified essential hypertension   . Type II or unspecified type diabetes mellitus without mention of complication, not stated as uncontrolled     Past Surgical History  Procedure Laterality Date  . Coronary angioplasty with stent placement  04/23/2007    PCI and stenting of mid RCA - Dr Kalil @ [facility]  . Coronary  artery bypass graft N/A 07/04/2012    Procedure: CORONARY ARTERY BYPASS GRAFTING (CABG);  Surgeon: Clarence H Owen, MD;  Location: [facility] OR;  Service: Open Heart Surgery;  Laterality: N/A;  x3 using right greater saphenous vein and left internal mammary.   . Intraoperative transesophageal echocardiogram N/A 07/04/2012    Procedure: INTRAOPERATIVE TRANSESOPHAGEAL ECHOCARDIOGRAM;  Surgeon: Clarence H Owen, MD;  Location: [facility] OR;  Service: Open Heart Surgery;  Laterality: N/A;  . Left heart catheterization with coronary angiogram N/A 06/25/2012    Procedure: LEFT HEART CATHETERIZATION WITH CORONARY ANGIOGRAM;  Surgeon: Jonathan J Berry, MD;  Location: [facility] CATH LAB;  Service: Cardiovascular;  Laterality: N/A;    Patient Care Team:  , DO as PCP - General (Internal Medicine) Jonathan J Berry, MD as Consulting Physician (Cardiology)  Social History   Social History  . Marital Status: Divorced    Spouse Name: N/A  . Number of Children: 1  . Years of Education: N/A   Occupational History  . MAINTENANCE    Social History Main Topics  . Smoking status: Never Smoker   . Smokeless tobacco: Never Used  . Alcohol Use: No  . Drug Use: No  . Sexual Activity: Not on file   Other Topics Concern  . Not on file   Social   History Narrative     reports that he has never smoked. He has never used smokeless tobacco. He reports that he does not drink alcohol or use illicit drugs.  No Known Allergies  Medications: Patient's Medications  New Prescriptions   No medications on file  Previous Medications   ALBUTEROL (PROVENTIL HFA;VENTOLIN HFA) 108 (90 BASE) MCG/ACT INHALER    One puff three times daily for wheezing or shortness of breath   ASPIRIN 81 MG CHEWABLE TABLET    Chew 81 mg by mouth once.    ATORVASTATIN (LIPITOR) 80 MG TABLET    take 1 tablet by mouth once daily   BLOOD GLUCOSE MONITORING SUPPL (BAYER CONTOUR NEXT MONITOR) W/DEVICE KIT    Check blood sugar three times daily DX: E11.22     CLOPIDOGREL (PLAVIX) 75 MG TABLET    take 1 tablet by mouth once daily   GLIPIZIDE (GLUCOTROL) 10 MG TABLET    take 1 tablet by mouth once daily   GLUCOSE BLOOD (BAYER CONTOUR NEXT TEST) TEST STRIP    Check blood sugar three times daily DX: E11.22   INSULIN GLARGINE (TOUJEO SOLOSTAR) 300 UNIT/ML SOPN    Inject 12 Units into the skin daily.   INSULIN PEN NEEDLE 32G X 4 MM MISC    1 each by Does not apply route daily.   LISINOPRIL (PRINIVIL,ZESTRIL) 5 MG TABLET    take 1 tablet by mouth once daily   METFORMIN (GLUCOPHAGE) 1000 MG TABLET    take 1 tablet by mouth twice a day for diabetes   METOPROLOL TARTRATE (LOPRESSOR) 25 MG TABLET    take 1/2 tablet by mouth twice a day   OMEPRAZOLE (PRILOSEC) 20 MG CAPSULE    Take 1 capsule (20 mg total) by mouth daily.   OXYCODONE-ACETAMINOPHEN (ROXICET) 5-325 MG TABLET    Take 2 tablet in the morning and one tablet in the evening as needed for pain   SERTRALINE (ZOLOFT) 100 MG TABLET    Take 1.5 tablets (150 mg total) by mouth at bedtime.   VIAGRA 100 MG TABLET    take 1 tablet by mouth once daily as directed   ZOLPIDEM (AMBIEN) 10 MG TABLET    Take one tablet by mouth at bedtime as needed for sleep  Modified Medications   No medications on file  Discontinued Medications   No medications on file    Review of Systems  Constitutional: Positive for fatigue.  Musculoskeletal: Positive for back pain, arthralgias and gait problem.  Psychiatric/Behavioral: Positive for sleep disturbance and dysphoric mood. The patient is nervous/anxious.   All other systems reviewed and are negative.   Filed Vitals:   06/20/15 0859  BP: 132/70  Pulse: 60  Temp: 98.2 F (36.8 C)  TempSrc: Oral  Height: 5' 5" (1.651 m)  Weight: 265 lb (120.203 kg)  SpO2: 95%   Body mass index is 44.1 kg/(m^2).  Physical Exam  Constitutional: He is oriented to person, place, and time. He appears well-developed and well-nourished.  No conversational dyspnea. Looks tired in NAD   HENT:  Mouth/Throat: Oropharynx is clear and moist.  Eyes: Pupils are equal, round, and reactive to light. No scleral icterus.  Neck: Neck supple. Carotid bruit is not present.  Cardiovascular: Normal rate, regular rhythm, normal heart sounds and intact distal pulses.  Exam reveals no gallop and no friction rub.   No murmur heard. no distal LE swelling. No calf TTP  Pulmonary/Chest: Effort normal. He has decreased breath sounds. He has no  wheezes. He has no rhonchi. He has no rales. He exhibits no tenderness.  Abdominal: Soft. Bowel sounds are normal. He exhibits no distension, no abdominal bruit, no pulsatile midline mass and no mass. There is no tenderness. There is no rebound and no guarding.  obese  Musculoskeletal: He exhibits edema and tenderness.  Lymphadenopathy:    He has no cervical adenopathy.  Neurological: He is alert and oriented to person, place, and time.  Skin: Skin is warm and dry. Rash (facial erythema) noted.  Psychiatric: He has a normal mood and affect. His behavior is normal. Judgment and thought content normal.     Labs reviewed: Appointment on 06/18/2015  Component Date Value Ref Range Status  . Glucose 06/18/2015 111* 65 - 99 mg/dL Final  . BUN 06/18/2015 17  6 - 24 mg/dL Final  . Creatinine, Ser 06/18/2015 0.89  0.76 - 1.27 mg/dL Final  . GFR calc non Af Amer 06/18/2015 94  >59 mL/min/1.73 Final  . GFR calc Af Amer 06/18/2015 109  >59 mL/min/1.73 Final  . BUN/Creatinine Ratio 06/18/2015 19  9 - 20 Final  . Sodium 06/18/2015 141  134 - 144 mmol/L Final  . Potassium 06/18/2015 4.9  3.5 - 5.2 mmol/L Final  . Chloride 06/18/2015 101  96 - 106 mmol/L Final  . CO2 06/18/2015 24  18 - 29 mmol/L Final  . Calcium 06/18/2015 9.3  8.7 - 10.2 mg/dL Final  . ALT 06/18/2015 112* 0 - 44 IU/L Final  Appointment on 05/30/2015  Component Date Value Ref Range Status  . Hgb A1c MFr Bld 05/30/2015 6.8* 4.8 - 5.6 % Final   Comment:          Pre-diabetes: 5.7 - 6.4           Diabetes: >6.4          Glycemic control for adults with diabetes: <7.0   . Est. average glucose Bld gHb Est-m* 05/30/2015 148   Final  . Glucose 05/30/2015 116* 65 - 99 mg/dL Final  . BUN 05/30/2015 20  6 - 24 mg/dL Final  . Creatinine, Ser 05/30/2015 0.93  0.76 - 1.27 mg/dL Final  . GFR calc non Af Amer 05/30/2015 90  >59 mL/min/1.73 Final  . GFR calc Af Amer 05/30/2015 104  >59 mL/min/1.73 Final  . BUN/Creatinine Ratio 05/30/2015 22* 9 - 20 Final  . Sodium 05/30/2015 137  134 - 144 mmol/L Final  . Potassium 05/30/2015 4.4  3.5 - 5.2 mmol/L Final  . Chloride 05/30/2015 97  96 - 106 mmol/L Final  . CO2 05/30/2015 25  18 - 29 mmol/L Final  . Calcium 05/30/2015 9.2  8.7 - 10.2 mg/dL Final  . Total Protein 05/30/2015 7.3  6.0 - 8.5 g/dL Final  . Albumin 05/30/2015 4.3  3.5 - 5.5 g/dL Final  . Globulin, Total 05/30/2015 3.0  1.5 - 4.5 g/dL Final  . Albumin/Globulin Ratio 05/30/2015 1.4  1.2 - 2.2 Final  . Bilirubin Total 05/30/2015 0.5  0.0 - 1.2 mg/dL Final  . Alkaline Phosphatase 05/30/2015 104  39 - 117 IU/L Final  . AST 05/30/2015 98* 0 - 40 IU/L Final  . ALT 05/30/2015 117* 0 - 44 IU/L Final  . Cholesterol, Total 05/30/2015 116  100 - 199 mg/dL Final  . Triglycerides 05/30/2015 102  0 - 149 mg/dL Final  . HDL 05/30/2015 33* >39 mg/dL Final  . VLDL Cholesterol Cal 05/30/2015 20  5 - 40 mg/dL Final  . LDL Calculated 05/30/2015 63    0 - 99 mg/dL Final  . Chol/HDL Ratio 05/30/2015 3.5  0.0 - 5.0 ratio units Final   Comment:                                   T. Chol/HDL Ratio                                             Men  Women                               1/2 Avg.Risk  3.4    3.3                                   Avg.Risk  5.0    4.4                                2X Avg.Risk  9.6    7.1                                3X Avg.Risk 23.4   11.0     Us Abdomen Limited Ruq  06/13/2015  CLINICAL DATA:  Abnormal liver enzymes ; patient on lipid lowering agents but discontinued them 2  weeks ago EXAM: US ABDOMEN LIMITED - RIGHT UPPER QUADRANT COMPARISON:  Abdominal pelvic CT scan of December 27, 2007 FINDINGS: Gallbladder: No gallstones or wall thickening visualized. No sonographic Murphy sign noted by sonographer. Common bile duct: Diameter: 2.9 mm Liver: The hepatic echotexture is increased. There is no focal mass or ductal dilation. The surface contour of the liver is normal. IMPRESSION: Fatty infiltrative change of the liver similar to that demonstrated on the CT scan from 2009. No gallbladder or common bile duct abnormality. Electronically Signed   By: David  Jordan M.D.   On: 06/13/2015 09:37     Assessment/Plan   ICD-9-CM ICD-10-CM   1. Type 2 diabetes mellitus with diabetic neuropathy, with long-term current use of insulin (HCC) 250.60 E11.40 Ambulatory referral to Ophthalmology   357.2 Z79.4    V58.67    2. Depression with anxiety 300.4 F41.8 sertraline (ZOLOFT) 100 MG tablet     busPIRone (BUSPAR) 15 MG tablet  3. Hyperlipidemia LDL goal <70 272.4 E78.5 Lipid Panel  4. Insomnia 780.52 G47.00   5. Essential hypertension, benign 401.1 I10   6. Obstructive sleep apnea 327.23 G47.33   7. Elevated liver enzymes 790.5 R74.8      Repeat LFTs next month to follow levels  Start buspar 2 times daily for anxiety  STOP lipitor  Continue other medications as ordered  Follow up in 3 mos for CPE. Keep appt with Cathey Miller as scheduled. Fasting labs prior to appt   S. , D. O., F. A. C. O. I.  [facility] [address] [place], [place] [address] [phone] Cell (Monday-Friday 8 AM - 5 PM) [phone] After 5 PM and follow prompts    

## 2015-06-26 ENCOUNTER — Telehealth: Payer: Self-pay | Admitting: Pulmonary Disease

## 2015-06-26 ENCOUNTER — Ambulatory Visit: Payer: BLUE CROSS/BLUE SHIELD | Admitting: Pulmonary Disease

## 2015-06-26 ENCOUNTER — Encounter: Payer: Self-pay | Admitting: Pulmonary Disease

## 2015-06-26 VITALS — BP 130/72 | HR 60 | Temp 97.7°F | Ht 69.0 in | Wt 264.8 lb

## 2015-06-26 DIAGNOSIS — I10 Essential (primary) hypertension: Secondary | ICD-10-CM

## 2015-06-26 DIAGNOSIS — E1140 Type 2 diabetes mellitus with diabetic neuropathy, unspecified: Secondary | ICD-10-CM

## 2015-06-26 DIAGNOSIS — Z951 Presence of aortocoronary bypass graft: Secondary | ICD-10-CM

## 2015-06-26 DIAGNOSIS — I2510 Atherosclerotic heart disease of native coronary artery without angina pectoris: Secondary | ICD-10-CM

## 2015-06-26 DIAGNOSIS — Z794 Long term (current) use of insulin: Secondary | ICD-10-CM

## 2015-06-26 DIAGNOSIS — G4733 Obstructive sleep apnea (adult) (pediatric): Secondary | ICD-10-CM

## 2015-06-26 DIAGNOSIS — R0600 Dyspnea, unspecified: Secondary | ICD-10-CM

## 2015-06-26 NOTE — Telephone Encounter (Signed)
Spoke with Barry [name] at [facility] and pt's card did not have anything on it for download.  They are sending him a new card.  PT is aware

## 2015-06-26 NOTE — Patient Instructions (Signed)
Today we updated your med list in our EPIC system...    Continue your current medications the same...  Continue w/ the CPAP nightly...    Once you have the new CARD in the machine- bring it to your DME company for a "download" after ~69month...       We will call you w/ the results...  Keep up the good work w/ diet & exercise!!!  Call for any questions...  Let's plan a follow up visit in 15mo, sooner if needed for problems.Marland KitchenMarland Kitchen

## 2015-06-26 NOTE — Progress Notes (Signed)
Subjective:     Patient ID: Barry [name], male   DOB: [birth year], 60 y.o.   MRN: [id number]  HPI  ~  March 11, 2015:  Initial pulmonary evaluation by SN>      38 y/o WM, referred by [facility], for a pulmonary evaluation due to dyspnea>  Pt c/o SOB/DOE & some wheezing w/ activity- walking, stairs, etc; this has been present for some time now & persistent ?progressive; he denies cough, sput, hemoptysis, CP; Barry [name] is a never-smoker; he has a hx signif heart problems w/ CAD & s/p CABG- he never did cardiac rehab after his surg & he is morbidly obese and too sedentary/ deconditioned; he works as a Air traffic controller for [employer]- often has to walk up & down 3 flights...     Followed by DrClance for [facility]> last seen 10/2012- Dx in 2008 w/ severe OSA w/ sleep study showing AHI=69 events /hr, placed on CPAP w/ good response; f/u sleep study after his CABG showed AHI=77 events/hr w/ desat as low as 76%; he was asked to work aggressively on wt loss & was ordered a new machine w/ auto mode to optimize pressure (DME=[facility]); I cannot find any download reports and we will call them...     Followed for CARDS by DrBerry> last seen 03/2014- CAD, s/p CABG x3 6/14, HBP, HL; had some atypCP felt to be reflux- rec PPI rx; rec f/u 90yr..  Smoking Hx>  Never smoked...  Pulmonary Hx>  He was diagnosed w/ cutaneous sarcoidosis, no prior hx systemic or lung involvement; Hx OSA- on CPAP; prev hx bronchitis requiring antibiotics ~1/yr, no hx pneumonia/ asthma/ Tb or known exposure...  Medical Hx>  HBP, CAD- s/p CABG x3 in 2014, HL, IDDM, morbid obesity, anxiety- divorced 6/16...  Family Hx>  No family hx lung disease...   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  Current Meds>  AlbutHFA prn; ASA81, Plavix75, Metop25-1/2Bid, Lisin5, Lipitor80, Lantus/ Metformin/ Glucotrol, Prilosec, Zoloft, Ambien, Percocet...  EXAM shows Afeb, VSS, O2sat=97% on RA; 271#, 5'5"Tall, BMI=45;  HEENT- neg, mallampati3;  Chest- clear w/o w/r/r;  Heart- RR w/o m/r/g;  Abd- obese, soft, nontender;  Ext- neg w/o c/c/e;  Neuro- intact...  Last 2DEcho 03/29/14 showed mild LVH, norm LVF w/ EF=65-70% & no regional wall motion abn, norm valves x mild TR, PAsys~376mg  Last CXR 01/15/15 by DrCarter> heart at upper lim of norm, s/p edian sternotomy/ CABG, sl prom pulm vascularity, min blunting of left angle, NSD; DDD noted...  CTChest 02/07/15 ordered by DrCarter> mild cardiomeg, s/p CABG, coronary atherosclerosis, scarring vs atx at left base w/ fatty pleural thickening, NAD (no evid for sarcoisosis), no adenopathy, +thoracic spondylosis...  Spirometry 03/11/15>  FVC=3.34 (83%), FEV1=2.56 (79%), %1sec=77, mid-flows are mildly reduced at 65% predicted;  This shows essentially normal airflow x for mild small airways dis...  Ambulatory oximetry 03/11/15>  O2sat=96% on RA at rest w/ pulse=57/min;  He ambulated 3Laps in the office w/ lowest O2sat=94% w/ pulse=82/min...  LABS in EPIC reviewed>  Chems 12/16 were OK w/ BS=153, A1c=8.0;  Last CBC- wnl;  Last TFT- wnl...  HE DID NOT GO TO THE LAB FOR ACE level as requested 03/11/15 OV...  IMP/PLAN>>     DYSPNEA> this is clearly multifactorial w/ components from his heart dis, sedentary/ deconditioned, obesity, OSA, anxiety-- the main intervention of benefit for him will be DIET/ EXERCISE/ Wt REDUCTION; he may also benefit from stress reduction/ anxiolytic therapy...Marland KitchenMarland Kitchen  He is a never smoker w/o hx of any significant lung problems> he does not have any sign of pulm sarcoidosis...     OSA> on CPAP, he has not had any follow up & we will endeavor to get CPAP download data from [facility] (he says he hasn't had tubing changed in 2+yrs!)    Morbid Obesity> he weighs 271#, 5'5" Tall, BMI=45> we reviewed diet, exercise, wt reduction...     CARDIAC Hx>  HBP, CAD- s/p CABG x3, HL-- [facility] by [facility]    MEDICAL Hx> IDDM, morbid obesity, anxiety-- followed by DrCarter  ~   June 26, 2015:  3-85moROV w/ SN>         Past Medical History  Diagnosis Date  . Coronary artery disease   . [place]    inferior wall  . Sleep apnea   . [place]  . S/P CABG x 3 07/04/2012    LIMA to LAD, SVG to D1, SVG to PDA, EVH via right thigh  . Unspecified essential hypertension   . Type II or unspecified type diabetes mellitus without mention of complication, not stated as uncontrolled     Past Surgical History  Procedure Laterality Date  . Coronary angioplasty with stent placement  04/23/2007    PCI and stenting of mid RCA - [email or web address] . Coronary artery bypass graft N/A 07/04/2012    Procedure: CORONARY ARTERY BYPASS GRAFTING (CABG);  Surgeon: CRexene Alberts MD;  [place]  Service: Open Heart Surgery;  Laterality: N/A;  x3 using right greater saphenous vein and left internal mammary.   . Intraoperative transesophageal echocardiogram N/A 07/04/2012    Procedure: INTRAOPERATIVE TRANSESOPHAGEAL ECHOCARDIOGRAM;  Surgeon: CRexene Alberts MD;  [place] [facility]  Service: Open Heart Surgery;  Laterality: N/A;  . Left heart catheterization with coronary angiogram N/A 06/25/2012    Procedure: LEFT HEART CATHETERIZATION WITH CORONARY ANGIOGRAM;  Surgeon: JLorretta Harp MD;  [facility];  Service: Cardiovascular;  Laterality: N/A;    Outpatient Encounter Prescriptions as of 06/26/2015  Medication Sig  . albuterol (PROVENTIL HFA;VENTOLIN HFA) 108 (90 BASE) MCG/ACT inhaler One puff three times daily for wheezing or shortness of breath  . aspirin 81 MG chewable tablet Chew 81 mg by mouth once.   . Blood Glucose Monitoring Suppl (BAYER CONTOUR NEXT MONITOR) w/Device KIT Check blood sugar three times daily DX: E11.22  . busPIRone (BUSPAR) 15 MG tablet Take 1 tablet (15 mg total) by mouth 2 (two) times daily. For anxiety  . clopidogrel (PLAVIX) 75 MG tablet take 1 tablet by mouth once daily  . glipiZIDE  (GLUCOTROL) 10 MG tablet take 1 tablet by mouth once daily  . glucose blood (BAYER CONTOUR NEXT TEST) test strip Check blood sugar three times daily DX: E11.22  . Insulin Glargine (TOUJEO SOLOSTAR) 300 UNIT/ML SOPN Inject 12 Units into the skin daily.  . Insulin Pen Needle 32G X 4 MM MISC 1 each by Does not apply route daily.  .Marland Kitchenlisinopril (PRINIVIL,ZESTRIL) 5 MG tablet take 1 tablet by mouth once daily  . metFORMIN (GLUCOPHAGE) 1000 MG tablet take 1 tablet by mouth twice a day for diabetes  . metoprolol tartrate (LOPRESSOR) 25 MG tablet take 1/2 tablet by mouth twice a day  . omeprazole (PRILOSEC) 20 MG capsule Take 1 capsule (20 mg total) by mouth daily.  .Marland KitchenoxyCODONE-acetaminophen (ROXICET) 5-325 MG tablet Take 2 tablet in the morning and one tablet  in the evening as needed for pain  . sertraline (ZOLOFT) 100 MG tablet Take 1.5 tablets (150 mg total) by mouth at bedtime.  Marland Kitchen VIAGRA 100 MG tablet take 1 tablet by mouth once daily as directed  . zolpidem (AMBIEN) 10 MG tablet Take one tablet by mouth at bedtime as needed for sleep  . atorvastatin (LIPITOR) 80 MG tablet take 1 tablet by mouth once daily (Patient not taking: Reported on 06/26/2015)   No facility-administered encounter medications on file as of 06/26/2015.    No Known Allergies   Immunization History  Administered Date(s) Administered  . Influenza,inj,Quad PF,36+ Mos 10/10/2012, 11/21/2013, 10/16/2014  . Pneumococcal Polysaccharide-23 09/05/2012  . Tdap 05/08/2014    Family History  Problem Relation Age of Onset  . Cancer Mother   . Diabetes Sister   . Diabetes Brother     Social History   Social History  . Marital Status: Divorced    Spouse Name: N/A  . Number of Children: 1  . Years of Education: N/A   Occupational History  . MAINTENANCE    Social History Main Topics  . Smoking status: Never Smoker   . Smokeless tobacco: Never Used  . Alcohol Use: No  . Drug Use: No  . Sexual Activity: Not on file    Other Topics Concern  . Not on file   Social History Narrative    Current Medications, Allergies, Past Medical History, Past Surgical History, Family History, and Social History were reviewed in [employer] record.   Review of Systems             All symptoms NEG except where BOLDED >>  Constitutional:  F/C/S, fatigue, anorexia, unexpected weight change. HEENT:  HA, visual changes, hearing loss, earache, nasal symptoms, sore throat, mouth sores, hoarseness. Resp:  cough, sputum, hemoptysis; SOB, tightness, wheezing. Cardio:  CP, palpit, DOE, orthopnea, edema. GI:  N/V/D/C, blood in stool; reflux, abd pain, distention, gas. GU:  dysuria, freq, urgency, hematuria, flank pain, voiding difficulty. MS:  joint pain, swelling, tenderness, decr ROM; neck pain, back pain, etc. Neuro:  HA, tremors, seizures, dizziness, syncope, weakness, numbness, gait abn. Skin:  suspicious lesions or skin rash. Heme:  adenopathy, bruising, bleeding. Psyche:  confusion, agitation, sleep disturbance, hallucinations, anxiety, depression suicidal.   Objective:   Physical Exam       Vital Signs:  Reviewed...  General:  WD, morbidly obese, 60 y/o WM in NAD; alert & oriented; pleasant & cooperative... HEENT:  [place]/AT; Conjunctiva- pink, Sclera- nonicteric, EOM-wnl, PERRLA, Fundi-benign; EACs-clear, TMs-wnl; NOSE-clear; THROAT-clear & wnl. Neck:  Supple w/ fair ROM; no JVD; normal carotid impulses w/o bruits; no thyromegaly or nodules palpated; no lymphadenopathy. Chest:  Clear to P & A; without wheezes, rales, or rhonchi heard. Heart:  s/p CABG, Regular Rhythm; norm S1 & S2 without murmurs, rubs, or gallops detected. Abdomen:  Obese, soft & nontender- no guarding or rebound; normal bowel sounds; no organomegaly or masses palpated. Ext:  Normal ROM; without deformities or arthritic changes; no varicose veins, venous insuffic, tr edema;  Pulses intact w/o bruits. Neuro:  CNs II-XII intact;  motor testing normal; sensory testing normal; gait normal & balance OK. Derm:  No lesions noted; no rash etc. Lymph:  No cervical, supraclavicular, axillary, or inguinal adenopathy palpated.   Assessment:      IMP/PLAN>>     DYSPNEA> this is clearly multifactorial w/ components from his heart dis, sedentary/ deconditioned, obesity, OSA, anxiety-- the main intervention of benefit for  him will be DIET/ EXERCISE/ Wt REDUCTION; he may also benefit from stress reduction/ anxiolytic therapy...    He is a never smoker w/o hx of any significant lung problems> he does not have any sign of pulm sarcoidosis...     OSA> on CPAP, he has not had any follow up & we will endeavor to get CPAP download data from [facility] (he says he hasn't had tubing changed in 2+yrs!)    Morbid Obesity> he weighs 271#, 5'5" Tall, BMI=45> we reviewed diet, exercise, wt reduction...     CARDIAC Hx>  HBP, CAD- s/p CABG x3, HL-- followed by DrBerry    MEDICAL Hx> IDDM, morbid obesity, anxiety-- followed by DrCarter     Plan:     Patient's Medications  New Prescriptions   No medications on file  Previous Medications   ALBUTEROL (PROVENTIL HFA;VENTOLIN HFA) 108 (90 BASE) MCG/ACT INHALER    One puff three times daily for wheezing or shortness of breath   ASPIRIN 81 MG CHEWABLE TABLET    Chew 81 mg by mouth once.    ATORVASTATIN (LIPITOR) 80 MG TABLET    take 1 tablet by mouth once daily   BLOOD GLUCOSE MONITORING SUPPL (BAYER CONTOUR NEXT MONITOR) W/DEVICE KIT    Check blood sugar three times daily DX: E11.22   BUSPIRONE (BUSPAR) 15 MG TABLET    Take 1 tablet (15 mg total) by mouth 2 (two) times daily. For anxiety   CLOPIDOGREL (PLAVIX) 75 MG TABLET    take 1 tablet by mouth once daily   GLIPIZIDE (GLUCOTROL) 10 MG TABLET    take 1 tablet by mouth once daily   GLUCOSE BLOOD (BAYER CONTOUR NEXT TEST) TEST STRIP    Check blood sugar three times daily DX: E11.22   INSULIN GLARGINE (TOUJEO SOLOSTAR) 300 UNIT/ML SOPN    Inject 12 Units  into the skin daily.   INSULIN PEN NEEDLE 32G X 4 MM MISC    1 each by Does not apply route daily.   LISINOPRIL (PRINIVIL,ZESTRIL) 5 MG TABLET    take 1 tablet by mouth once daily   METFORMIN (GLUCOPHAGE) 1000 MG TABLET    take 1 tablet by mouth twice a day for diabetes   METOPROLOL TARTRATE (LOPRESSOR) 25 MG TABLET    take 1/2 tablet by mouth twice a day   OMEPRAZOLE (PRILOSEC) 20 MG CAPSULE    Take 1 capsule (20 mg total) by mouth daily.   OXYCODONE-ACETAMINOPHEN (ROXICET) 5-325 MG TABLET    Take 2 tablet in the morning and one tablet in the evening as needed for pain   SERTRALINE (ZOLOFT) 100 MG TABLET    Take 1.5 tablets (150 mg total) by mouth at bedtime.   VIAGRA 100 MG TABLET    take 1 tablet by mouth once daily as directed   ZOLPIDEM (AMBIEN) 10 MG TABLET    Take one tablet by mouth at bedtime as needed for sleep  Modified Medications   No medications on file  Discontinued Medications   No medications on file

## 2015-07-04 ENCOUNTER — Telehealth: Payer: Self-pay | Admitting: Pulmonary Disease

## 2015-07-04 NOTE — Telephone Encounter (Signed)
Attempted to contact pt. The line was answered but I could not hear the pt. He will need to contact his DME about this. Will try back.

## 2015-07-07 ENCOUNTER — Other Ambulatory Visit: Payer: BLUE CROSS/BLUE SHIELD | Admitting: Pharmacotherapy

## 2015-07-07 ENCOUNTER — Other Ambulatory Visit: Payer: BLUE CROSS/BLUE SHIELD

## 2015-07-07 DIAGNOSIS — E785 Hyperlipidemia, unspecified: Secondary | ICD-10-CM

## 2015-07-08 LAB — LIPID PANEL
CHOLESTEROL TOTAL: 174 mg/dL (ref 100–199)
Chol/HDL Ratio: 4.8 ratio units (ref 0.0–5.0)
HDL: 36 mg/dL — ABNORMAL LOW (ref >39)
LDL Calculated: 117 mg/dL — ABNORMAL HIGH (ref 0–99)
TRIGLYCERIDES: 105 mg/dL (ref 0–149)
VLDL Cholesterol Cal: 21 mg/dL (ref 5–40)

## 2015-07-09 NOTE — Telephone Encounter (Signed)
lmtcb

## 2015-07-10 NOTE — Telephone Encounter (Signed)
Spoke with pt. Advised him that he would need to contact his DME to get a new chip. He agreed and verbalized understanding. Nothing further ws needed.

## 2015-07-16 ENCOUNTER — Other Ambulatory Visit: Payer: Self-pay | Admitting: *Deleted

## 2015-07-16 MED ORDER — ZOLPIDEM TARTRATE 10 MG PO TABS: ORAL_TABLET | ORAL | Status: DC

## 2015-07-16 NOTE — Telephone Encounter (Signed)
[facility] 

## 2015-07-22 ENCOUNTER — Other Ambulatory Visit: Payer: Self-pay | Admitting: *Deleted

## 2015-07-22 DIAGNOSIS — M5417 Radiculopathy, lumbosacral region: Secondary | ICD-10-CM

## 2015-07-22 MED ORDER — OXYCODONE-ACETAMINOPHEN 5-325 MG PO TABS: ORAL_TABLET | ORAL | Status: DC

## 2015-07-22 NOTE — Telephone Encounter (Signed)
Patient requested and will pick up 

## 2015-08-19 ENCOUNTER — Other Ambulatory Visit: Payer: Self-pay | Admitting: Internal Medicine

## 2015-08-20 ENCOUNTER — Other Ambulatory Visit: Payer: Self-pay | Admitting: *Deleted

## 2015-08-20 MED ORDER — ZOLPIDEM TARTRATE 10 MG PO TABS: ORAL_TABLET | ORAL | 0 refills | Status: DC

## 2015-08-20 NOTE — Telephone Encounter (Signed)
[facility] 

## 2015-08-22 ENCOUNTER — Other Ambulatory Visit: Payer: Self-pay

## 2015-08-22 DIAGNOSIS — M5417 Radiculopathy, lumbosacral region: Secondary | ICD-10-CM

## 2015-08-22 MED ORDER — OXYCODONE-ACETAMINOPHEN 5-325 MG PO TABS: ORAL_TABLET | ORAL | 0 refills | Status: DC

## 2015-09-02 NOTE — Addendum Note (Signed)
Addended by: Maurice Small on: 09/02/2015 03:36 PM   Modules accepted: Orders

## 2015-09-05 ENCOUNTER — Other Ambulatory Visit: Payer: BLUE CROSS/BLUE SHIELD

## 2015-09-05 DIAGNOSIS — E1165 Type 2 diabetes mellitus with hyperglycemia: Principal | ICD-10-CM

## 2015-09-05 DIAGNOSIS — Z794 Long term (current) use of insulin: Principal | ICD-10-CM

## 2015-09-05 DIAGNOSIS — E1122 Type 2 diabetes mellitus with diabetic chronic kidney disease: Secondary | ICD-10-CM

## 2015-09-05 LAB — COMPREHENSIVE METABOLIC PANEL
ALT: 70 U/L — ABNORMAL HIGH (ref 9–46)
AST: 64 U/L — ABNORMAL HIGH (ref 10–35)
Albumin: 3.7 g/dL (ref 3.6–5.1)
Alkaline Phosphatase: 86 U/L (ref 40–115)
BUN: 20 mg/dL (ref 7–25)
CO2: 23 mmol/L (ref 20–31)
Calcium: 9.1 mg/dL (ref 8.6–10.3)
Chloride: 105 mmol/L (ref 98–110)
Creat: 0.92 mg/dL (ref 0.70–1.33)
Glucose, Bld: 101 mg/dL — ABNORMAL HIGH (ref 65–99)
Potassium: 4.4 mmol/L (ref 3.5–5.3)
Sodium: 138 mmol/L (ref 135–146)
Total Bilirubin: 0.6 mg/dL (ref 0.2–1.2)
Total Protein: 6.9 g/dL (ref 6.1–8.1)

## 2015-09-05 LAB — HEMOGLOBIN A1C
Hgb A1c MFr Bld: 6.8 % — ABNORMAL HIGH (ref <5.7)
Mean Plasma Glucose: 148 mg/dL

## 2015-09-06 LAB — MICROALBUMIN / CREATININE URINE RATIO
Creatinine, Urine: 135 mg/dL (ref 20–370)
Microalb Creat Ratio: 47 mcg/mg creat — ABNORMAL HIGH (ref <30)
Microalb, Ur: 6.3 mg/dL

## 2015-09-08 ENCOUNTER — Ambulatory Visit (INDEPENDENT_AMBULATORY_CARE_PROVIDER_SITE_OTHER): Payer: BLUE CROSS/BLUE SHIELD | Admitting: Pharmacotherapy

## 2015-09-08 ENCOUNTER — Encounter: Payer: Self-pay | Admitting: Pharmacotherapy

## 2015-09-08 VITALS — BP 126/78 | HR 72 | Resp 12 | Ht 69.0 in | Wt 259.2 lb

## 2015-09-08 DIAGNOSIS — Z794 Long term (current) use of insulin: Secondary | ICD-10-CM | POA: Diagnosis not present

## 2015-09-08 DIAGNOSIS — I10 Essential (primary) hypertension: Secondary | ICD-10-CM | POA: Diagnosis not present

## 2015-09-08 DIAGNOSIS — E785 Hyperlipidemia, unspecified: Secondary | ICD-10-CM | POA: Diagnosis not present

## 2015-09-08 DIAGNOSIS — E1140 Type 2 diabetes mellitus with diabetic neuropathy, unspecified: Secondary | ICD-10-CM | POA: Diagnosis not present

## 2015-09-08 NOTE — Patient Instructions (Signed)
Keep up the good work!  Get back on healthy eating schedule. Remember to restart routine exercise.

## 2015-09-08 NOTE — Progress Notes (Signed)
  Subjective:    Barry [name] is a 60 y.o.white male who presents for follow-up of Type 2 diabetes mellitus.   A1C 6.8% (was 6.8%) SCr 0.92 AST / ALT:  64 / 70 - improved. Microalbumin/ creatinine ratio:  47  Back on atorvastatin - LFT improved after it was discontinued for 10 days, still OK now. Currently taking Toujeo, Metformin, and glipizide.  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 Eating habits have been terrible. No routine exercise. Wears glasses.  Only has vision in left eye. Some peripheral edema. No problems with feet. Nocturia 1-2 times per night. Staying well hydrated with water.  Forgot to bring meter. Not always checking daily. Self reports lowest BG: 110mg /dl Self reports highest BG:  230mg /dl Self reports average:  180mg /dl   Review of Systems A comprehensive review of systems was negative except for: Constitutional: positive for fatigue and weight loss Eyes: positive for contacts/glasses and only has left eye Cardiovascular: positive for lower extremity edema Genitourinary: positive for nocturia Endocrine: positive for diabetic symptoms including blurry vision and polydipsia    Objective:    BP 126/78   Pulse 72   Resp 12   Ht 5\' 9"  (1.753 m)   Wt 259 lb 3.2 oz (117.6 kg)   BMI 38.28 kg/m   General:  alert, cooperative and no distress  Oropharynx: normal findings: lips normal without lesions and gums healthy   Eyes:  negative findings: lids and lashes normal, conjunctivae and sclerae normal and glass right eye   Ears:  external ears normal        Lung: clear to auscultation bilaterally  Heart:  regular rate and rhythm     Extremities: edema bilateral lower extremities  Skin: warm and dry, no hyperpigmentation, vitiligo, or suspicious lesions     Neuro: mental status, speech normal, alert and oriented x3 and gait and station normal   Lab Review Glucose (mg/dL)  Date Value  93/81/0175 111 (H)   05/30/2015 116 (H)  01/13/2015 153 (H)   Glucose, Bld (mg/dL)  Date Value  11/18/8525 101 (H)  07/06/2012 196 (H)  07/05/2012 127 (H)   CO2 (mmol/L)  Date Value  09/05/2015 23  06/18/2015 24  05/30/2015 25   BUN (mg/dL)  Date Value  78/24/2353 20  06/18/2015 17  05/30/2015 20  01/13/2015 14   Creat (mg/dL)  Date Value  61/44/3154 0.92   Creatinine, Ser (mg/dL)  Date Value  00/86/7619 0.89  05/30/2015 0.93  01/13/2015 0.94       Assessment:    Diabetes Mellitus type II, under excellent control. A1C at goal <7%. BP at goal <140/90 Weight is down 6 more pounds. LDL goal is <70; is back on his atorvastatin.  Plan:    1.  Rx changes: none  2.  Continue Toujeo 12 units daily. 3.  Continue metformin and glipizide. 4.  Continue atorvastatin - will monitor LFT closely. 5.  BP at goal <140/90. 6.  Counseled on nutrition goals 7.  Exercise goal is 30-45 minutes 5 x week. 8.  Weight loss efforts continue to improve - down 6 more pounds.

## 2015-09-22 ENCOUNTER — Other Ambulatory Visit: Payer: BLUE CROSS/BLUE SHIELD

## 2015-09-22 DIAGNOSIS — E785 Hyperlipidemia, unspecified: Secondary | ICD-10-CM

## 2015-09-22 DIAGNOSIS — E1140 Type 2 diabetes mellitus with diabetic neuropathy, unspecified: Secondary | ICD-10-CM

## 2015-09-22 DIAGNOSIS — Z794 Long term (current) use of insulin: Principal | ICD-10-CM

## 2015-09-22 LAB — COMPLETE METABOLIC PANEL WITH GFR
ALT: 84 U/L — ABNORMAL HIGH (ref 9–46)
AST: 71 U/L — ABNORMAL HIGH (ref 10–35)
Albumin: 3.8 g/dL (ref 3.6–5.1)
Alkaline Phosphatase: 83 U/L (ref 40–115)
BUN: 17 mg/dL (ref 7–25)
CO2: 25 mmol/L (ref 20–31)
Calcium: 8.9 mg/dL (ref 8.6–10.3)
Chloride: 104 mmol/L (ref 98–110)
Creat: 0.94 mg/dL (ref 0.70–1.33)
GFR, Est African American: >89 mL/min (ref >=60)
GFR, Est Non African American: 89 mL/min (ref >=60)
Glucose, Bld: 151 mg/dL — ABNORMAL HIGH (ref 65–99)
Potassium: 5 mmol/L (ref 3.5–5.3)
Sodium: 138 mmol/L (ref 135–146)
Total Bilirubin: 0.5 mg/dL (ref 0.2–1.2)
Total Protein: 6.9 g/dL (ref 6.1–8.1)

## 2015-09-22 LAB — HEMOGLOBIN A1C
Hgb A1c MFr Bld: 6.8 % — ABNORMAL HIGH (ref <5.7)
Mean Plasma Glucose: 148 mg/dL

## 2015-09-22 LAB — LIPID PANEL
Cholesterol: 113 mg/dL — ABNORMAL LOW (ref 125–200)
HDL: 35 mg/dL — ABNORMAL LOW (ref >=40)
LDL Cholesterol: 63 mg/dL (ref <130)
Total CHOL/HDL Ratio: 3.2 Ratio (ref <=5.0)
Triglycerides: 76 mg/dL (ref <150)
VLDL: 15 mg/dL (ref <30)

## 2015-09-24 ENCOUNTER — Encounter: Payer: Self-pay | Admitting: Internal Medicine

## 2015-09-24 ENCOUNTER — Other Ambulatory Visit: Payer: Self-pay | Admitting: Internal Medicine

## 2015-09-24 ENCOUNTER — Ambulatory Visit (INDEPENDENT_AMBULATORY_CARE_PROVIDER_SITE_OTHER): Payer: BLUE CROSS/BLUE SHIELD | Admitting: Internal Medicine

## 2015-09-24 VITALS — BP 122/80 | HR 60 | Temp 98.1°F | Ht 65.75 in | Wt 259.2 lb

## 2015-09-24 DIAGNOSIS — F418 Other specified anxiety disorders: Secondary | ICD-10-CM

## 2015-09-24 DIAGNOSIS — M5417 Radiculopathy, lumbosacral region: Secondary | ICD-10-CM | POA: Diagnosis not present

## 2015-09-24 DIAGNOSIS — Z0000 Encounter for general adult medical examination without abnormal findings: Secondary | ICD-10-CM

## 2015-09-24 DIAGNOSIS — E785 Hyperlipidemia, unspecified: Secondary | ICD-10-CM | POA: Diagnosis not present

## 2015-09-24 DIAGNOSIS — R748 Abnormal levels of other serum enzymes: Secondary | ICD-10-CM | POA: Diagnosis not present

## 2015-09-24 DIAGNOSIS — I10 Essential (primary) hypertension: Secondary | ICD-10-CM

## 2015-09-24 DIAGNOSIS — Z23 Encounter for immunization: Secondary | ICD-10-CM | POA: Diagnosis not present

## 2015-09-24 DIAGNOSIS — G4733 Obstructive sleep apnea (adult) (pediatric): Secondary | ICD-10-CM

## 2015-09-24 DIAGNOSIS — G4700 Insomnia, unspecified: Secondary | ICD-10-CM | POA: Diagnosis not present

## 2015-09-24 DIAGNOSIS — E1122 Type 2 diabetes mellitus with diabetic chronic kidney disease: Secondary | ICD-10-CM | POA: Diagnosis not present

## 2015-09-24 DIAGNOSIS — N400 Enlarged prostate without lower urinary tract symptoms: Secondary | ICD-10-CM

## 2015-09-24 DIAGNOSIS — E1140 Type 2 diabetes mellitus with diabetic neuropathy, unspecified: Secondary | ICD-10-CM

## 2015-09-24 DIAGNOSIS — E1165 Type 2 diabetes mellitus with hyperglycemia: Secondary | ICD-10-CM

## 2015-09-24 DIAGNOSIS — Z794 Long term (current) use of insulin: Secondary | ICD-10-CM

## 2015-09-24 MED ORDER — BUSPIRONE HCL 15 MG PO TABS: 15.0000 mg | ORAL_TABLET | Freq: Two times a day (BID) | ORAL | 3 refills | Status: DC

## 2015-09-24 MED ORDER — SERTRALINE HCL 100 MG PO TABS: 150.0000 mg | ORAL_TABLET | Freq: Every day | ORAL | 1 refills | Status: DC

## 2015-09-24 MED ORDER — OXYCODONE-ACETAMINOPHEN 5-325 MG PO TABS: ORAL_TABLET | ORAL | 0 refills | Status: DC

## 2015-09-24 MED ORDER — INSULIN GLARGINE 300 UNIT/ML ~~LOC~~ SOPN: 12.0000 [IU] | PEN_INJECTOR | Freq: Every day | SUBCUTANEOUS | 5 refills | Status: DC

## 2015-09-24 MED ORDER — CLOPIDOGREL BISULFATE 75 MG PO TABS: 75.0000 mg | ORAL_TABLET | Freq: Every day | ORAL | 1 refills | Status: DC

## 2015-09-24 MED ORDER — METOPROLOL TARTRATE 25 MG PO TABS: 12.5000 mg | ORAL_TABLET | Freq: Two times a day (BID) | ORAL | 1 refills | Status: DC

## 2015-09-24 MED ORDER — LISINOPRIL 5 MG PO TABS: 5.0000 mg | ORAL_TABLET | Freq: Every day | ORAL | 1 refills | Status: DC

## 2015-09-24 MED ORDER — ATORVASTATIN CALCIUM 80 MG PO TABS: 80.0000 mg | ORAL_TABLET | Freq: Every day | ORAL | 1 refills | Status: DC

## 2015-09-24 MED ORDER — METFORMIN HCL 1000 MG PO TABS: ORAL_TABLET | ORAL | 1 refills | Status: DC

## 2015-09-24 MED ORDER — OMEPRAZOLE 20 MG PO CPDR: 20.0000 mg | DELAYED_RELEASE_CAPSULE | Freq: Every day | ORAL | 3 refills | Status: DC

## 2015-09-24 NOTE — Progress Notes (Signed)
Patient ID: Barry [name], male   DOB: 1955/09/13, 60 y.o.   MRN: [id number]   Location:  [facility]  Place of Service:  OFFICE  Provider: Arletha Grippe, DO  Patient Care Team: Gildardo Cranker, DO as PCP - General (Internal Medicine) Lorretta Harp, MD as Consulting Physician (Cardiology)  Extended Emergency Contact Information Primary Emergency Contact: Carita Pian Address: [address]          [place], [place] [address] Johnnette Litter of [employer] Phone: [phone] Relation: Friend  Code Status: FULL CODE Goals of Care: Advanced Directive information Advanced Directives 09/24/2015  Does patient have an advance directive? Yes  Type of Advance Directive Living will  Copy of advanced directive(s) in chart? No - copy requested  Would patient like information on creating an advanced directive? -  Pre-existing out of facility DNR order (yellow form or pink MOST form) -     Chief Complaint  Patient presents with  . Annual Exam    yearly exam  . Other    request flu vacc.    HPI: Patient is a 60 y.o. male seen in today for an annual wellness exam.    saw Tivis Ringer for diabetes earlier this month. lipitor stopped due to elevated liver enzymes. He maintains healthy diet and is slowly losing weight. Activity level about the same. He feels really tired and has had to take a nap in the middle of the day at times. Recent ALT 84; AST 71.   He has breakthrough anxiety/worrying on sertraline 19m qhs. He does not sleep well even with ambien.   HTN - stable on lisinopril and lopressor  DM - BS checked fasting and range 100-140. Occasional low BS reactions  (weak/dizziness/shaking) relieved with eating and/or drinking. He takes toujeo 12 units daily, glipizide, metformin. A1c 6.8%. Urine microalbumin/Cr ratio 47  Hyperlipidemia - taking lipitor. LDL 63  Knee pain - stable on prn percocet. He has b/l knee pain. He has pain in his ankles at night when he tries to cross them. He  states "my whole body hurts"  CAD - stable. No CP. Occasional SOB. Takes BB, ACEI and plavix. He saw cardio in March and was told he was stable and to f/u in 1 yr  Sleep apnea - stable on CPAP. He has rash on face due to mask   [facility] 06/20/2015 03/26/2014 11/21/2013 11/21/2013  Decreased Interest 0 3 1 0  Down, Depressed, Hopeless 0 3 1 0  PHQ - 2 Score 0 6 2 0  Altered sleeping - 2 3 -  Tired, decreased energy - 3 2 -  Change in appetite - - 2 -  Feeling bad or failure about yourself  - 1 1 -  Trouble concentrating - 2 1 -  Moving slowly or fidgety/restless - 0 0 -  Suicidal thoughts - 0 0 -  PHQ-9 Score - 14 11 -    Fall Risk  09/24/2015 09/08/2015 06/20/2015 06/02/2015 03/31/2015  Falls in the past year? No No No No No   No flowsheet data found.   Health Maintenance  Topic Date Due  . Hepatitis C Screening  031-Mar-1957 . HIV Screening  04/27/1972  . OPHTHALMOLOGY EXAM  01/04/2015  . FOOT EXAM  07/12/2015  . INFLUENZA VACCINE  08/26/2015  . HEMOGLOBIN A1C  03/24/2016  . PNEUMOCOCCAL POLYSACCHARIDE VACCINE (2) 09/05/2017  . COLONOSCOPY  11/22/2023  . TETANUS/TDAP  05/07/2024    Urinary incontinence? none  Functional Status Survey: Is the patient deaf or have difficulty hearing?: No Does the patient have difficulty seeing, even when wearing glasses/contacts?: No Does the patient have difficulty concentrating, remembering, or making decisions?: No Does the patient have difficulty walking or climbing stairs?: No Does the patient have difficulty dressing or bathing?: No Does the patient have difficulty doing errands alone such as visiting a doctor's office or shopping?: No  Exercise? No regular routine  Diet? A lot of processed foods  No exam data present  Hearing: no issues    Dentition: no recent visit  Pain: uncontrolled in back  Past Medical History:  Diagnosis Date  . Coronary artery disease   . MI (myocardial infarction) ([facility]) 04/23/2007     inferior wall  . Morbid obesity ([facility]) 06/26/2012  . S/P CABG x 3 07/04/2012   LIMA to LAD, SVG to D1, SVG to PDA, EVH via right thigh  . Sleep apnea   . Type II or unspecified type diabetes mellitus without mention of complication, not stated as uncontrolled   . Unspecified essential hypertension     Past Surgical History:  Procedure Laterality Date  . CORONARY ANGIOPLASTY WITH STENT PLACEMENT  04/23/2007   PCI and stenting of mid RCA - Dr Donnetta Hutching @ [facility]  . CORONARY ARTERY BYPASS GRAFT N/A 07/04/2012   Procedure: CORONARY ARTERY BYPASS GRAFTING (CABG);  Surgeon: Rexene Alberts, MD;  Location: [facility];  Service: Open Heart Surgery;  Laterality: N/A;  x3 using right greater saphenous vein and left internal mammary.   . INTRAOPERATIVE TRANSESOPHAGEAL ECHOCARDIOGRAM N/A 07/04/2012   Procedure: INTRAOPERATIVE TRANSESOPHAGEAL ECHOCARDIOGRAM;  Surgeon: Rexene Alberts, MD;  Location: [facility];  Service: Open Heart Surgery;  Laterality: N/A;  . LEFT HEART CATHETERIZATION WITH CORONARY ANGIOGRAM N/A 06/25/2012   Procedure: LEFT HEART CATHETERIZATION WITH CORONARY ANGIOGRAM;  Surgeon: Lorretta Harp, MD;  Location: [facility] C[facility] LAB;  Service: Cardiovascular;  Laterality: N/A;    Family History  Problem Relation Age of Onset  . Cancer Mother   . Diabetes Sister   . Diabetes Brother    Family Status  Relation Status  . Mother Deceased  . Father Deceased  . Sister Alive  . Brother Alive  . Daughter Alive  . Brother Alive  . Brother Alive  . Sister Alive  . Sister   . Brother     Social History   Social History  . Marital status: Divorced    Spouse name: N/A  . Number of children: 1  . Years of education: N/A   Occupational History  . [employer]   Social History Main Topics  . Smoking status: Never Smoker  . Smokeless tobacco: Never Used  . Alcohol use No  . Drug use: No  . Sexual activity: Not on file   Other Topics Concern  . Not on file   Social History  Narrative  . No narrative on file    No Known Allergies    Medication List       Accurate as of 09/24/15  3:41 PM. Always use your most recent med list.          albuterol 108 (90 Base) MCG/ACT inhaler Commonly known as:  PROVENTIL HFA;VENTOLIN HFA One puff three times daily for wheezing or shortness of breath   aspirin 81 MG chewable tablet Chew 81 mg by mouth once.   atorvastatin 80 MG tablet Commonly known as:  LIPITOR take 1 tablet by mouth once daily   BAYER  CONTOUR NEXT MONITOR w/Device Kit Check blood sugar three times daily DX: E11.22   busPIRone 15 MG tablet Commonly known as:  BUSPAR Take 1 tablet (15 mg total) by mouth 2 (two) times daily. For anxiety   clopidogrel 75 MG tablet Commonly known as:  PLAVIX take 1 tablet by mouth once daily   glipiZIDE 10 MG tablet Commonly known as:  GLUCOTROL take 1 tablet by mouth once daily   glucose blood test strip Commonly known as:  BAYER CONTOUR NEXT TEST Check blood sugar three times daily DX: E11.22   Insulin Glargine 300 UNIT/ML Sopn Commonly known as:  TOUJEO SOLOSTAR Inject 12 Units into the skin daily.   Insulin Pen Needle 32G X 4 MM Misc 1 each by Does not apply route daily.   lisinopril 5 MG tablet Commonly known as:  PRINIVIL,ZESTRIL take 1 tablet by mouth once daily   metFORMIN 1000 MG tablet Commonly known as:  GLUCOPHAGE take 1 tablet by mouth twice a day for diabetes   metoprolol tartrate 25 MG tablet Commonly known as:  LOPRESSOR take 1/2 tablet by mouth twice a day   omeprazole 20 MG capsule Commonly known as:  PRILOSEC Take 1 capsule (20 mg total) by mouth daily.   oxyCODONE-acetaminophen 5-325 MG tablet Commonly known as:  ROXICET Take 2 tablet in the morning and one tablet in the evening as needed for pain   sertraline 100 MG tablet Commonly known as:  ZOLOFT Take 1.5 tablets (150 mg total) by mouth at bedtime.   VIAGRA 100 MG tablet Generic drug:  sildenafil take 1 tablet  by mouth once daily as directed   zolpidem 10 MG tablet Commonly known as:  AMBIEN Take one tablet by mouth at bedtime as needed for sleep        Review of Systems:  Review of Systems  Constitutional: Positive for fatigue.  Musculoskeletal: Positive for arthralgias and back pain.  Neurological: Positive for headaches (frontal).    Diabetic Foot Exam - Simple   Simple Foot Form Diabetic Foot exam was performed with the following findings:  Yes 09/24/2015  3:55 PM  Visual Inspection No deformities, no ulcerations, no other skin breakdown bilaterally:  Yes Sensation Testing Intact to touch and monofilament testing bilaterally:  Yes Pulse Check Posterior Tibialis and Dorsalis pulse intact bilaterally:  Yes Comments     Physical Exam: Vitals:   09/24/15 1449  BP: 122/80  Pulse: 60  Temp: 98.1 F (36.7 C)  TempSrc: Oral  SpO2: 97%  Weight: 259 lb 3.2 oz (117.6 kg)  Height: 5' 5.75" (1.67 m)   Body mass index is 42.16 kg/m. Physical Exam  Constitutional: He is oriented to person, place, and time. He appears well-developed and well-nourished. No distress.  HENT:  Head: Normocephalic and atraumatic.  Right Ear: Hearing, tympanic membrane, external ear and ear canal normal.  Left Ear: Hearing, tympanic membrane, external ear and ear canal normal.  Mouth/Throat: Uvula is midline, oropharynx is clear and moist and mucous membranes are normal. He does not have dentures.  Eyes: Conjunctivae, EOM and lids are normal. Pupils are equal, round, and reactive to light. Left eye exhibits no discharge. No scleral icterus.  Prosthetic right eye. OS PERRL with intact EOM  Neck: Trachea normal and normal range of motion. Neck supple. Carotid bruit is not present. No thyroid mass and no thyromegaly present.  Cardiovascular: Normal rate, regular rhythm, normal heart sounds and intact distal pulses.  Exam reveals no gallop and no friction rub.  No murmur heard. No carotid bruit b/l. No LE  edema b/l. No calf TTP.   Pulmonary/Chest: Effort normal and breath sounds normal. He has no wheezes. He has no rhonchi. He has no rales. Right breast exhibits no inverted nipple, no mass, no nipple discharge, no skin change and no tenderness. Left breast exhibits no inverted nipple, no mass, no nipple discharge, no skin change and no tenderness. Breasts are symmetrical.  Abdominal: Soft. Normal appearance, normal aorta and bowel sounds are normal. He exhibits no pulsatile midline mass and no mass. There is no hepatosplenomegaly. There is no tenderness. There is no rigidity, no rebound and no guarding. No hernia. Hernia confirmed negative in the right inguinal area and confirmed negative in the left inguinal area.  Genitourinary: Rectum normal and testes normal. Rectal exam shows no external hemorrhoid, no internal hemorrhoid, no fissure, no mass, no tenderness, anal tone normal and guaiac negative stool. Prostate is enlarged (smooth, firm and no palpable mass). Prostate is not tender. Right testis shows no mass, no swelling and no tenderness. Left testis shows no mass, no swelling and no tenderness. Uncircumcised. No penile tenderness. No discharge found.  Genitourinary Comments: Chaperone present  Musculoskeletal: Normal range of motion. He exhibits edema and tenderness.       Back:  Lymphadenopathy:       Head (right side): No posterior auricular adenopathy present.       Head (left side): No posterior auricular adenopathy present.    He has no cervical adenopathy.       Right: No inguinal and no supraclavicular adenopathy present.       Left: No inguinal and no supraclavicular adenopathy present.  Neurological: He is alert and oriented to person, place, and time. He has normal strength and normal reflexes. No cranial nerve deficit. Gait normal.  Skin: Skin is warm, dry and intact. No rash noted. Nails show no clubbing.  Psychiatric: He has a normal mood and affect. His speech is normal and  behavior is normal. Thought content normal. Cognition and memory are normal.    Labs reviewed:  Basic Metabolic Panel:  Recent Labs  06/18/15 0828 09/05/15 0805 09/22/15 0820  NA 141 138 138  K 4.9 4.4 5.0  CL 101 105 104  CO2 '24 23 25  ' GLUCOSE 111* 101* 151*  BUN '17 20 17  ' CREATININE 0.89 0.92 0.94  CALCIUM 9.3 9.1 8.9   Liver Function Tests:  Recent Labs  05/30/15 0859 06/18/15 0828 09/05/15 0805 09/22/15 0820  AST 98*  --  64* 71*  ALT 117* 112* 70* 84*  ALKPHOS 104  --  86 83  BILITOT 0.5  --  0.6 0.5  PROT 7.3  --  6.9 6.9  ALBUMIN 4.3  --  3.7 3.8   No results for input(s): LIPASE, AMYLASE in the last 8760 hours. No results for input(s): AMMONIA in the last 8760 hours. CBC: No results for input(s): WBC, NEUTROABS, HGB, HCT, MCV, PLT in the last 8760 hours. Lipid Panel:  Recent Labs  05/30/15 0859 07/07/15 0830 09/22/15 0820  CHOL 116 174 113*  HDL 33* 36* 35*  LDLCALC 63 117* 63  TRIG 102 105 76  CHOLHDL 3.5 4.8 3.2   Lab Results  Component Value Date   HGBA1C 6.8 (H) 09/22/2015    Procedures: No results found. ECG OBTAINED AND REVIEWED BY MYSELF: SB @ 54 bpm, LAD, slight IVCD. No acute ischemic changes. No change since 03/2014  Assessment/Plan   ICD-9-CM ICD-10-CM  1. Well adult exam V70.0 Z00.00   2. Lumbosacral radiculopathy 724.4 M54.17 oxyCODONE-acetaminophen (ROXICET) 5-325 MG tablet  3. Encounter for immunization Z23 Z23 Flu Vaccine QUAD 36+ mos IM  4. Type 2 diabetes mellitus with diabetic neuropathy, with long-term current use of insulin (HCC) 250.60 E11.40    357.2 Z79.4    V58.67    5. Essential hypertension, benign 401.1 I10   6. Hyperlipidemia LDL goal <70 272.4 E78.5   7. Depression with anxiety 300.4 F41.8 busPIRone (BUSPAR) 15 MG tablet     sertraline (ZOLOFT) 100 MG tablet  8. Insomnia 780.52 G47.00   9. Obstructive sleep apnea 327.23 G47.33   10. Elevated liver enzymes 790.5 R74.8   11. Morbid obesity due to excess  calories ([facility]) 278.01 E66.01   12. Uncontrolled type 2 diabetes mellitus with chronic kidney disease, with long-term current use of insulin, unspecified CKD stage (HCC) 250.42 E11.22 Insulin Glargine (TOUJEO SOLOSTAR) 300 UNIT/ML SOPN   585.9 Z79.4    V58.67 E11.65   13. Enlarged prostate on rectal exam - he is asymptomatic   Pt is UTD on health maintenance. Vaccinations are UTD. Pt maintains a healthy lifestyle. Encouraged pt to exercise 30-45 minutes 4-5 times per week. Eat a well balanced diet. Avoid smoking. Limit alcohol intake. Wear seatbelt when riding in the car. Wear sun block (SPF >50) when spending extended times outside.  Check fasting cmp, cbc w diff, a1c, tsh, lipid panel, psa, ua prior to next visit  Continue current medications as ordered  HA likely due to seasonal allergy - recommend OTC plain claritin, allegra or zyrtec daily  Recommend annual eye exam  Recommend OTC plain claritin, allegra or zyrtec daily for seasonal allergy  Follow up with Tivis Ringer as scheduled  Follow up in 3 mos for routine visit. Check fasting labs prior to appt - cmp, lipid panel, a1c, psa, tsh, cbc w diff   Serena Petterson S. Perlie Gold  [facility] [address] [place], [place] [address] [phone] Cell (Monday-Friday 8 AM - 5 PM) [phone] After 5 PM and follow prompts

## 2015-09-24 NOTE — Patient Instructions (Addendum)
Encouraged him to exercise 30-45 minutes 4-5 times per week. Eat a well balanced diet. Avoid smoking. Limit alcohol intake. Wear seatbelt when riding in the car. Wear sun block (SPF >50) when spending extended times outside.  Continue current medications as ordered  Recommend annual eye exam  Recommend OTC plain claritin, allegra or zyrtec daily for seasonal allergy  Follow up with Edison Pace as scheduled  Follow up in 3 mos for routine visit. Check fasting labs prior to appt

## 2015-10-10 ENCOUNTER — Telehealth: Payer: Self-pay

## 2015-10-10 NOTE — Telephone Encounter (Signed)
This patient is scheduled for fasting labs on 12/23/15. What labs do you want ordered and with what dx?  Please advise.

## 2015-10-12 NOTE — Telephone Encounter (Signed)
According to 8/30th office note, he is to get  - cmp, lipid panel, a1c, psa (enlarged prostate - N40.0), tsh (hyperlipidemia), cbc w diff

## 2015-10-13 ENCOUNTER — Other Ambulatory Visit: Payer: Self-pay

## 2015-10-13 DIAGNOSIS — Z794 Long term (current) use of insulin: Principal | ICD-10-CM

## 2015-10-13 DIAGNOSIS — N400 Enlarged prostate without lower urinary tract symptoms: Secondary | ICD-10-CM

## 2015-10-13 DIAGNOSIS — E785 Hyperlipidemia, unspecified: Secondary | ICD-10-CM

## 2015-10-13 DIAGNOSIS — E1140 Type 2 diabetes mellitus with diabetic neuropathy, unspecified: Secondary | ICD-10-CM

## 2015-10-23 ENCOUNTER — Other Ambulatory Visit: Payer: Self-pay | Admitting: Internal Medicine

## 2015-10-31 ENCOUNTER — Other Ambulatory Visit: Payer: Self-pay | Admitting: *Deleted

## 2015-10-31 DIAGNOSIS — M5417 Radiculopathy, lumbosacral region: Secondary | ICD-10-CM

## 2015-10-31 MED ORDER — OXYCODONE-ACETAMINOPHEN 5-325 MG PO TABS: ORAL_TABLET | ORAL | 0 refills | Status: DC

## 2015-10-31 NOTE — Telephone Encounter (Signed)
Patient requested and will pick up 

## 2015-11-28 ENCOUNTER — Other Ambulatory Visit: Payer: Self-pay | Admitting: Internal Medicine

## 2015-12-01 ENCOUNTER — Other Ambulatory Visit: Payer: Self-pay | Admitting: *Deleted

## 2015-12-01 DIAGNOSIS — M5417 Radiculopathy, lumbosacral region: Secondary | ICD-10-CM

## 2015-12-01 MED ORDER — OXYCODONE-ACETAMINOPHEN 5-325 MG PO TABS
ORAL_TABLET | ORAL | 0 refills | Status: DC
Start: 2015-12-01 — End: 2016-01-07

## 2015-12-01 NOTE — Telephone Encounter (Signed)
Patient requested and will pick up 

## 2015-12-04 ENCOUNTER — Other Ambulatory Visit: Payer: BLUE CROSS/BLUE SHIELD

## 2015-12-04 DIAGNOSIS — Z794 Long term (current) use of insulin: Principal | ICD-10-CM

## 2015-12-04 DIAGNOSIS — E785 Hyperlipidemia, unspecified: Secondary | ICD-10-CM

## 2015-12-04 DIAGNOSIS — N400 Enlarged prostate without lower urinary tract symptoms: Secondary | ICD-10-CM

## 2015-12-04 DIAGNOSIS — E1140 Type 2 diabetes mellitus with diabetic neuropathy, unspecified: Secondary | ICD-10-CM

## 2015-12-04 LAB — CBC WITH DIFFERENTIAL/PLATELET
BASOS ABS: 59 {cells}/uL (ref 0–200)
Basophils Relative: 1 %
EOS ABS: 177 {cells}/uL (ref 15–500)
Eosinophils Relative: 3 %
HEMATOCRIT: 42.2 % (ref 38.5–50.0)
HEMOGLOBIN: 13.7 g/dL (ref 13.2–17.1)
LYMPHS ABS: 1062 {cells}/uL (ref 850–3900)
Lymphocytes Relative: 18 %
MCH: 29.5 pg (ref 27.0–33.0)
MCHC: 32.5 g/dL (ref 32.0–36.0)
MCV: 90.9 fL (ref 80.0–100.0)
MPV: 11.2 fL (ref 7.5–12.5)
Monocytes Absolute: 531 cells/uL (ref 200–950)
Monocytes Relative: 9 %
NEUTROS ABS: 4071 {cells}/uL (ref 1500–7800)
Neutrophils Relative %: 69 %
Platelets: 136 10*3/uL — ABNORMAL LOW (ref 140–400)
RBC: 4.64 MIL/uL (ref 4.20–5.80)
RDW: 15.5 % — ABNORMAL HIGH (ref 11.0–15.0)
WBC: 5.9 10*3/uL (ref 3.8–10.8)

## 2015-12-04 LAB — HEMOGLOBIN A1C
HEMOGLOBIN A1C: 7.7 % — AB (ref <5.7)
MEAN PLASMA GLUCOSE: 174 mg/dL

## 2015-12-04 LAB — PSA: PSA: 0.4 ng/mL (ref <=4.0)

## 2015-12-05 LAB — COMPLETE METABOLIC PANEL WITH GFR
ALBUMIN: 4 g/dL (ref 3.6–5.1)
ALK PHOS: 78 U/L (ref 40–115)
ALT: 47 U/L — ABNORMAL HIGH (ref 9–46)
AST: 43 U/L — AB (ref 10–35)
BUN: 19 mg/dL (ref 7–25)
CHLORIDE: 99 mmol/L (ref 98–110)
CO2: 26 mmol/L (ref 20–31)
Calcium: 9.4 mg/dL (ref 8.6–10.3)
Creat: 0.82 mg/dL (ref 0.70–1.33)
GFR, Est African American: >89 mL/min (ref >=60)
GLUCOSE: 186 mg/dL — AB (ref 65–99)
POTASSIUM: 5 mmol/L (ref 3.5–5.3)
SODIUM: 134 mmol/L — AB (ref 135–146)
Total Bilirubin: 0.5 mg/dL (ref 0.2–1.2)
Total Protein: 7.3 g/dL (ref 6.1–8.1)

## 2015-12-05 LAB — LIPID PANEL
CHOL/HDL RATIO: 3.1 ratio (ref <5.0)
CHOLESTEROL: 130 mg/dL (ref <200)
HDL: 42 mg/dL (ref >40)
LDL Cholesterol: 71 mg/dL (ref <100)
Triglycerides: 86 mg/dL (ref <150)
VLDL: 17 mg/dL (ref <30)

## 2015-12-05 LAB — TSH: TSH: 1.65 mIU/L (ref 0.40–4.50)

## 2015-12-08 ENCOUNTER — Ambulatory Visit (INDEPENDENT_AMBULATORY_CARE_PROVIDER_SITE_OTHER): Payer: BLUE CROSS/BLUE SHIELD | Admitting: Pharmacotherapy

## 2015-12-08 ENCOUNTER — Encounter: Payer: Self-pay | Admitting: Pharmacotherapy

## 2015-12-08 VITALS — BP 120/72 | HR 62 | Ht 66.0 in | Wt 274.0 lb

## 2015-12-08 DIAGNOSIS — I10 Essential (primary) hypertension: Secondary | ICD-10-CM

## 2015-12-08 DIAGNOSIS — E1140 Type 2 diabetes mellitus with diabetic neuropathy, unspecified: Secondary | ICD-10-CM

## 2015-12-08 DIAGNOSIS — Z794 Long term (current) use of insulin: Secondary | ICD-10-CM

## 2015-12-08 MED ORDER — EMPAGLIFLOZIN 25 MG PO TABS: 25.0000 mg | ORAL_TABLET | Freq: Every day | ORAL | 6 refills | Status: AC

## 2015-12-08 NOTE — Patient Instructions (Signed)
Stop glipizide Start Jardiance 25mg  daily

## 2015-12-08 NOTE — Progress Notes (Signed)
  Subjective:    Barry [name] is a 60 y.o.white male who presents for follow-up of Type 2 diabetes mellitus.   A1C: 7.7% (was 6.8%) LFTs lower LDL 71 HDL 35 TG: 86 EGFR:  89  He admits to making poor dietary choices. No routine exercise.  Average BG: '186mg'$ /dl No hypoglycemia On Toujeo 12 units daily - admits to missing 1-2 doses Continues to take metformin and glipizide.  Life has been stressful. Having blurry vision.  Wears glasses.  Needs eye exam. Denies problems with feet. Complains of right sided pain.  Has hurt since his cardiac procedure.  He reports he "still feels the catheter" Some peripheral edema. Nocturia 2-3 times per night. Denies dysuria. Sometimes drinks water. - more in the summer months.   Review of Systems A comprehensive review of systems was negative except for: Eyes: positive for contacts/glasses and blurry Cardiovascular: positive for lower extremity edema Genitourinary: positive for nocturia Integument/breast: positive for dryness    Objective:    BP 120/72   Pulse 62   Ht '5\' 6"'$  (1.676 m)   Wt 274 lb (124.3 kg)   SpO2 97%   BMI 44.22 kg/m   General:  alert, cooperative and no distress  Oropharynx: normal findings: lips normal without lesions and gums healthy   Eyes:  negative findings: lids and lashes normal and conjunctivae and sclerae normal   Ears:  external ears normal        Lung: clear to auscultation bilaterally  Heart:  regular rate and rhythm     Extremities: edema lower extremites  Skin: dry     Neuro: mental status, speech normal, alert and oriented x3 and gait and station normal   Lab Review Glucose, Bld (mg/dL)  Date Value  12/04/2015 186 (H)  09/22/2015 151 (H)  09/05/2015 101 (H)   CO2 (mmol/L)  Date Value  12/04/2015 26  09/22/2015 25  09/05/2015 23   BUN (mg/dL)  Date Value  12/04/2015 19  09/22/2015 17  09/05/2015 20  06/18/2015 17  05/30/2015 20  01/13/2015 14   Creat (mg/dL)  Date Value   12/04/2015 0.82  09/22/2015 0.94  09/05/2015 0.92       Assessment:    Diabetes Mellitus type II, under fair control. A1C higher. BP at goal <140/90 LDL close to goal <70   Plan:    1.  Rx changes: stop glipizide.  Start Jardiance '25mg'$  daily. Jardiance may help with BP, weight loss, and CV protection. 2.   Continue Toujeo 12 units once daily. 3.  Continue Metformin. 4.  Counseled on nutrition goals. 5.  Counseled on need for routine exercise.  Goal is 30-45 minutes 5 x week. 6.  BP at goal. 7.  LDL close to goal.

## 2015-12-23 ENCOUNTER — Other Ambulatory Visit: Payer: BLUE CROSS/BLUE SHIELD

## 2015-12-23 DIAGNOSIS — E1140 Type 2 diabetes mellitus with diabetic neuropathy, unspecified: Secondary | ICD-10-CM

## 2015-12-23 DIAGNOSIS — Z794 Long term (current) use of insulin: Principal | ICD-10-CM

## 2015-12-23 LAB — COMPLETE METABOLIC PANEL WITH GFR
ALT: 64 U/L — ABNORMAL HIGH (ref 9–46)
AST: 52 U/L — ABNORMAL HIGH (ref 10–35)
Albumin: 4.2 g/dL (ref 3.6–5.1)
Alkaline Phosphatase: 97 U/L (ref 40–115)
BUN: 22 mg/dL (ref 7–25)
CO2: 25 mmol/L (ref 20–31)
Calcium: 9.5 mg/dL (ref 8.6–10.3)
Chloride: 99 mmol/L (ref 98–110)
Creat: 1.15 mg/dL (ref 0.70–1.33)
GFR, Est African American: 81 mL/min (ref >=60)
GFR, Est Non African American: 70 mL/min (ref >=60)
Glucose, Bld: 190 mg/dL — ABNORMAL HIGH (ref 65–99)
Potassium: 4.7 mmol/L (ref 3.5–5.3)
Sodium: 135 mmol/L (ref 135–146)
Total Bilirubin: 0.5 mg/dL (ref 0.2–1.2)
Total Protein: 7.3 g/dL (ref 6.1–8.1)

## 2015-12-24 LAB — MICROALBUMIN / CREATININE URINE RATIO
Creatinine, Urine: 79 mg/dL (ref 20–370)
Microalb Creat Ratio: 91 mcg/mg creat — ABNORMAL HIGH (ref <30)
Microalb, Ur: 7.2 mg/dL

## 2015-12-24 LAB — HEMOGLOBIN A1C
Hgb A1c MFr Bld: 7.8 % — ABNORMAL HIGH (ref <5.7)
Mean Plasma Glucose: 177 mg/dL

## 2015-12-26 ENCOUNTER — Ambulatory Visit (INDEPENDENT_AMBULATORY_CARE_PROVIDER_SITE_OTHER): Payer: BLUE CROSS/BLUE SHIELD | Admitting: Internal Medicine

## 2015-12-26 ENCOUNTER — Encounter: Payer: Self-pay | Admitting: Internal Medicine

## 2015-12-26 VITALS — BP 134/78 | HR 71 | Temp 98.0°F | Ht 66.0 in | Wt 264.4 lb

## 2015-12-26 DIAGNOSIS — E785 Hyperlipidemia, unspecified: Secondary | ICD-10-CM | POA: Diagnosis not present

## 2015-12-26 DIAGNOSIS — E1140 Type 2 diabetes mellitus with diabetic neuropathy, unspecified: Secondary | ICD-10-CM | POA: Diagnosis not present

## 2015-12-26 DIAGNOSIS — F418 Other specified anxiety disorders: Secondary | ICD-10-CM

## 2015-12-26 DIAGNOSIS — G4733 Obstructive sleep apnea (adult) (pediatric): Secondary | ICD-10-CM

## 2015-12-26 DIAGNOSIS — M5417 Radiculopathy, lumbosacral region: Secondary | ICD-10-CM

## 2015-12-26 DIAGNOSIS — I10 Essential (primary) hypertension: Secondary | ICD-10-CM | POA: Diagnosis not present

## 2015-12-26 DIAGNOSIS — Z794 Long term (current) use of insulin: Secondary | ICD-10-CM | POA: Diagnosis not present

## 2015-12-26 DIAGNOSIS — R748 Abnormal levels of other serum enzymes: Secondary | ICD-10-CM

## 2015-12-26 DIAGNOSIS — K219 Gastro-esophageal reflux disease without esophagitis: Secondary | ICD-10-CM

## 2015-12-26 MED ORDER — BUSPIRONE HCL 15 MG PO TABS: 15.0000 mg | ORAL_TABLET | Freq: Three times a day (TID) | ORAL | 6 refills | Status: DC

## 2015-12-26 MED ORDER — OMEPRAZOLE 20 MG PO CPDR: 20.0000 mg | DELAYED_RELEASE_CAPSULE | Freq: Every day | ORAL | 3 refills | Status: DC

## 2015-12-26 MED ORDER — OMEPRAZOLE 40 MG PO CPDR: 40.0000 mg | DELAYED_RELEASE_CAPSULE | Freq: Every day | ORAL | 1 refills | Status: DC

## 2015-12-26 NOTE — Progress Notes (Signed)
Patient ID: Barry [name], male   DOB: 1955/11/15, 60 y.o.   MRN: [id number]    Location:  [facility] Place of Service: OFFICE  Chief Complaint  Patient presents with  . Medical Management of Chronic Issues    3 months routine visit    HPI:  60 yo male seen today for f/u. He reports feeling tired.   He has breakthrough anxiety/worrying on sertraline 110m qhs. He does not sleep well even with ambien. He is having "really bad HA"  HTN - stable on lisinopril and lopressor. he is on an ASA daily  DM - BS checked fasting fluctuating. Fasting BS 180 today. Occasional low BS reactions  (weak/dizziness/shaking) relieved with eating and/or drinking. He takes toujeo 12 units daily, jardiance, metformin. A1c 7.8%. Urine microalbumin/Cr ratio 91. He is followed by CTivis Ringer Hyperlipidemia - taking lipitor. LDL 71  Knee pain - stable on prn percocet. He has b/l knee pain. He has pain in his ankles at night when he tries to cross them. He states "my whole body hurts"  CAD - stable. No CP. Occasional SOB. Takes BB, ACEI and plavix. He saw cardio in March and was told he was stable and to f/u in 1 yr  Sleep apnea - stable on CPAP. He has rash on face due to mask  Elevated liver enzymes - ALT 64; AST 52  GERD - uncontrolled indigestion as he has been out of omeprazole  Past Medical History:  Diagnosis Date  . Coronary artery disease   . MI (myocardial infarction) 04/23/2007   inferior wall  . Morbid obesity ([facility] 06/26/2012  . S/P CABG x 3 07/04/2012   LIMA to LAD, SVG to D1, SVG to PDA, EVH via right thigh  . Sleep apnea   . Type II or unspecified type diabetes mellitus without mention of complication, not stated as uncontrolled   . Unspecified essential hypertension     Past Surgical History:  Procedure Laterality Date  . CORONARY ANGIOPLASTY WITH STENT PLACEMENT  04/23/2007   PCI [facility] stent[facility] mid RCA [email or web address] . CORONARY ARTERY BYPASS GRAFT N/A 07/04/2012   Procedure: CORONARY  ARTERY BYPASS GRAFTING (CABG);  Surgeon: CRexene Alberts MD;  Location: [facility]  Service: Open Heart Surgery;  Laterality: N/A;  x3 using right greater saphenous vein and left internal mammary.   . INTRAOPERATIVE TRANSESOPHAGEAL ECHOCARDIOGRAM N/A 07/04/2012   Procedure: INTRAOPERATIVE TRANSESOPHAGEAL ECHOCARDIOGRAM;  Surgeon: CRexene Alberts MD;  [facility]  Service: Open Heart Surgery;  Laterality: N/A;  . LEFT HEART CATHETERIZATION WITH CORONARY ANGIOGRAM N/A 06/25/2012   Procedure: LEFT HEART CATHETERIZATION WITH CORONARY ANGIOGRAM;  Surgeon: JLorretta Harp MD;  [facility];  Service: Cardiovascular;  Laterality: N/A;    Patient Care Team: MGildardo Cranker DO as PCP - General (Internal Medicine) JLorretta Harp MD as Consulting Physician (Cardiology)  Social History   Social History  . Marital status: Divorced    Spouse name: N/A  . Number of children: 1  . Years of education: N/A   Occupational History  . [employer]  Social History Main Topics  . Smoking status: Never Smoker  . Smokeless tobacco: Never Used  . Alcohol use No  . Drug use: No  . Sexual activity: Not on file   Other Topics Concern  . Not on file   Social History Narrative  . No narrative on file     reports that he  has never smoked. He has never used smokeless tobacco. He reports that he does not drink alcohol or use drugs.  Family History  Problem Relation Age of Onset  . Cancer Mother   . Diabetes Sister   . Diabetes Brother    Family Status  Relation Status  . Mother Deceased  . Father Deceased  . Sister Alive  . Brother Alive  . Daughter Alive  . Brother Alive  . Brother Alive  . Sister Alive  . Sister   . Brother      No Known Allergies  Medications: Patient's Medications  New Prescriptions   No medications on file  Previous Medications   ALBUTEROL (PROVENTIL HFA;VENTOLIN HFA) 108 (90 BASE) MCG/ACT INHALER    One puff three times daily for  wheezing or shortness of breath   ASPIRIN 81 MG CHEWABLE TABLET    Chew 81 mg by mouth once.    ATORVASTATIN (LIPITOR) 80 MG TABLET    Take 1 tablet (80 mg total) by mouth daily.   BLOOD GLUCOSE MONITORING SUPPL (BAYER CONTOUR NEXT MONITOR) W/DEVICE KIT    Check blood sugar three times daily DX: E11.22   BUSPIRONE (BUSPAR) 15 MG TABLET    Take 1 tablet (15 mg total) by mouth 2 (two) times daily. For anxiety   CLOPIDOGREL (PLAVIX) 75 MG TABLET    Take 1 tablet (75 mg total) by mouth daily.   EMPAGLIFLOZIN (JARDIANCE) 25 MG TABS TABLET    Take 25 mg by mouth daily.   GLUCOSE BLOOD (BAYER CONTOUR NEXT TEST) TEST STRIP    Check blood sugar three times daily DX: E11.22   INSULIN GLARGINE (TOUJEO SOLOSTAR) 300 UNIT/ML SOPN    Inject 16 Units into the skin daily at 6 (six) AM.   INSULIN PEN NEEDLE 32G X 4 MM MISC    1 each by Does not apply route daily.   LISINOPRIL (PRINIVIL,ZESTRIL) 5 MG TABLET    Take 1 tablet (5 mg total) by mouth daily.   METFORMIN (GLUCOPHAGE) 1000 MG TABLET    take 1 tablet by mouth twice a day for diabetes   METOPROLOL TARTRATE (LOPRESSOR) 25 MG TABLET    Take 0.5 tablets (12.5 mg total) by mouth 2 (two) times daily.   OXYCODONE-ACETAMINOPHEN (ROXICET) 5-325 MG TABLET    Take 2 tablet in the morning and one tablet in the evening as needed for pain   SERTRALINE (ZOLOFT) 100 MG TABLET    Take 1.5 tablets (150 mg total) by mouth at bedtime.   VIAGRA 100 MG TABLET    take 1 tablet by mouth once daily as directed   ZOLPIDEM (AMBIEN) 10 MG TABLET    take 1 tablet by mouth at bedtime  Modified Medications   Modified Medication Previous Medication   OMEPRAZOLE (PRILOSEC) 20 MG CAPSULE omeprazole (PRILOSEC) 20 MG capsule      Take 1 capsule (20 mg total) by mouth daily.    Take 1 capsule (20 mg total) by mouth daily.  Discontinued Medications   No medications on file    Review of Systems  Constitutional: Positive for fatigue.  Musculoskeletal: Positive for arthralgias and back  pain.  Neurological: Positive for headaches (frontal).  Psychiatric/Behavioral: Positive for sleep disturbance. The patient is nervous/anxious.     Vitals:   12/26/15 0845  BP: 134/78  Pulse: 71  Temp: 98 F (36.7 C)  TempSrc: Oral  SpO2: 96%  Weight: 264 lb 6.4 oz (119.9 kg)  Height: _0  (1.676 m)  Body mass index is 42.68 kg/m.  Physical Exam  Constitutional: He is oriented to person, place, and time. He appears well-developed and well-nourished.  No conversational dyspnea. Looks tired in NAD  HENT:  Mouth/Throat: Oropharynx is clear and moist.  Eyes: Pupils are equal, round, and reactive to light. No scleral icterus.  Neck: Neck supple. Carotid bruit is not present.  Cardiovascular: Normal rate, regular rhythm, normal heart sounds and intact distal pulses.  Exam reveals no gallop and no friction rub.   No murmur heard. no distal LE swelling. No calf TTP  Pulmonary/Chest: Effort normal. He has decreased breath sounds. He has no wheezes. He has no rhonchi. He has no rales. He exhibits no tenderness.  Abdominal: Soft. Bowel sounds are normal. He exhibits no distension, no abdominal bruit, no pulsatile midline mass and no mass. There is tenderness (epigastric). There is no rebound and no guarding.  obese  Musculoskeletal: He exhibits edema and tenderness.  Lymphadenopathy:    He has no cervical adenopathy.  Neurological: He is alert and oriented to person, place, and time.  Skin: Skin is warm and dry. Rash (facial erythema) noted.  Psychiatric: He has a normal mood and affect. His behavior is normal. Judgment and thought content normal.     Labs reviewed: Appointment on 12/23/2015  Component Date Value Ref Range Status  . Hgb A1c MFr Bld 12/24/2015 7.8* <5.7 % Final   Comment:   For someone without known diabetes, a hemoglobin A1c value of 6.5% or greater indicates that they may have diabetes and this should be confirmed with a follow-up test.   For someone with  known diabetes, a value <7% indicates that their diabetes is well controlled and a value greater than or equal to 7% indicates suboptimal control. A1c targets should be individualized based on duration of diabetes, age, comorbid conditions, and other considerations.   Currently, no consensus exists for use of hemoglobin A1c for diagnosis of diabetes for children.     . Mean Plasma Glucose 12/24/2015 177  mg/dL Final  . Sodium 12/23/2015 135  135 - 146 mmol/L Final  . Potassium 12/23/2015 4.7  3.5 - 5.3 mmol/L Final  . Chloride 12/23/2015 99  98 - 110 mmol/L Final  . CO2 12/23/2015 25  20 - 31 mmol/L Final  . Glucose, Bld 12/23/2015 190* 65 - 99 mg/dL Final  . BUN 12/23/2015 22  7 - 25 mg/dL Final  . Creat 12/23/2015 1.15  0.70 - 1.33 mg/dL Final   Comment:   For patients > or = 58 years of age: The upper reference limit for Creatinine is approximately 13% higher for people identified as African-American.     . Total Bilirubin 12/23/2015 0.5  0.2 - 1.2 mg/dL Final  . Alkaline Phosphatase 12/23/2015 97  40 - 115 U/L Final  . AST 12/23/2015 52* 10 - 35 U/L Final  . ALT 12/23/2015 64* 9 - 46 U/L Final  . Total Protein 12/23/2015 7.3  6.1 - 8.1 g/dL Final  . Albumin 12/23/2015 4.2  3.6 - 5.1 g/dL Final  . Calcium 12/23/2015 9.5  8.6 - 10.3 mg/dL Final  . GFR, Est African American 12/23/2015 81  >=60 mL/min Final  . GFR, Est Non African American 12/23/2015 70  >=60 mL/min Final  . Creatinine, Urine 12/24/2015 79  20 - 370 mg/dL Final  . Microalb, Ur 12/24/2015 7.2  Not estab mg/dL Final  . Microalb Creat Ratio 12/24/2015 91* <30 mcg/mg creat Final   Comment: The  ADA has defined abnormalities in albumin excretion as follows:           Category           Result                            (mcg/mg creatinine)                 Normal:    <30       Microalbuminuria:    30 - 299   Clinical albuminuria:    > or = 300   The ADA recommends that at least two of three specimens collected  within a 3 - 6 month period be abnormal before considering a patient to be within a diagnostic category.     Lab on 12/04/2015  Component Date Value Ref Range Status  . Sodium 12/05/2015 134* 135 - 146 mmol/L Final  . Potassium 12/05/2015 5.0  3.5 - 5.3 mmol/L Final  . Chloride 12/05/2015 99  98 - 110 mmol/L Final  . CO2 12/05/2015 26  20 - 31 mmol/L Final  . Glucose, Bld 12/05/2015 186* 65 - 99 mg/dL Final  . BUN 12/05/2015 19  7 - 25 mg/dL Final  . Creat 12/05/2015 0.82  0.70 - 1.33 mg/dL Final   Comment:   For patients > or = 58 years of age: The upper reference limit for Creatinine is approximately 13% higher for people identified as African-American.     . Total Bilirubin 12/05/2015 0.5  0.2 - 1.2 mg/dL Final  . Alkaline Phosphatase 12/05/2015 78  40 - 115 U/L Final  . AST 12/05/2015 43* 10 - 35 U/L Final  . ALT 12/05/2015 47* 9 - 46 U/L Final  . Total Protein 12/05/2015 7.3  6.1 - 8.1 g/dL Final  . Albumin 12/05/2015 4.0  3.6 - 5.1 g/dL Final  . Calcium 12/05/2015 9.4  8.6 - 10.3 mg/dL Final  . GFR, Est African American 12/05/2015 >89  >=60 mL/min Final  . GFR, Est Non African American 12/05/2015 >89  >=60 mL/min Final  . Cholesterol 12/05/2015 130  <200 mg/dL Final   Comment: ** Please note change in reference range(s). **     . Triglycerides 12/05/2015 86  <150 mg/dL Final   Comment: ** Please note change in reference range(s). **     . HDL 12/05/2015 42  >40 mg/dL Final   Comment: ** Please note change in reference range(s). **     . Total CHOL/HDL Ratio 12/05/2015 3.1  <5.0 Ratio Final  . VLDL 12/05/2015 17  <30 mg/dL Final  . LDL Cholesterol 12/05/2015 71  <100 mg/dL Final   Comment: ** Please note change in reference range(s). **     . Hgb A1c MFr Bld 12/04/2015 7.7* <5.7 % Final   Comment:   For someone without known diabetes, a hemoglobin A1c value of 6.5% or greater indicates that they may have diabetes and this should be confirmed with a follow-up  test.   For someone with known diabetes, a value <7% indicates that their diabetes is well controlled and a value greater than or equal to 7% indicates suboptimal control. A1c targets should be individualized based on duration of diabetes, age, comorbid conditions, and other considerations.   Currently, no consensus exists for use of hemoglobin A1c for diagnosis of diabetes for children.     . Mean Plasma Glucose 12/04/2015 174  mg/dL Final  . PSA 12/04/2015  0.4  <=4.0 ng/mL Final   Comment:   The total PSA value from this assay system is standardized against the WHO standard. The test result will be approximately 20% lower when compared to the equimolar-standardized total PSA (Beckman Coulter). Comparison of serial PSA results should be interpreted with this fact in mind.   This test was performed using the Siemens chemiluminescent method. Values obtained from different assay methods cannot be used interchangeably. PSA levels, regardless of value, should not be interpreted as absolute evidence of the presence or absence of disease.     Marland Kitchen TSH 12/05/2015 1.65  0.40 - 4.50 mIU/L Final  . WBC 12/04/2015 5.9  3.8 - 10.8 K/uL Final  . RBC 12/04/2015 4.64  4.20 - 5.80 MIL/uL Final  . Hemoglobin 12/04/2015 13.7  13.2 - 17.1 g/dL Final  . HCT 12/04/2015 42.2  38.5 - 50.0 % Final  . MCV 12/04/2015 90.9  80.0 - 100.0 fL Final  . MCH 12/04/2015 29.5  27.0 - 33.0 pg Final  . MCHC 12/04/2015 32.5  32.0 - 36.0 g/dL Final  . RDW 12/04/2015 15.5* 11.0 - 15.0 % Final  . Platelets 12/04/2015 136* 140 - 400 K/uL Final  . MPV 12/04/2015 11.2  7.5 - 12.5 fL Final  . Neutro Abs 12/04/2015 4071  1,500 - 7,800 cells/uL Final  . Lymphs Abs 12/04/2015 1062  850 - 3,900 cells/uL Final  . Monocytes Absolute 12/04/2015 531  200 - 950 cells/uL Final  . Eosinophils Absolute 12/04/2015 177  15 - 500 cells/uL Final  . Basophils Absolute 12/04/2015 59  0 - 200 cells/uL Final  . Neutrophils Relative %  12/04/2015 69  % Final  . Lymphocytes Relative 12/04/2015 18  % Final  . Monocytes Relative 12/04/2015 9  % Final  . Eosinophils Relative 12/04/2015 3  % Final  . Basophils Relative 12/04/2015 1  % Final  . Smear Review 12/04/2015 Criteria for review not met   Final    No results found.   Assessment/Plan   ICD-9-CM ICD-10-CM   1. Gastroesophageal reflux disease, esophagitis presence not specified 530.81 K21.9 omeprazole (PRILOSEC) 40 MG capsule  2. Type 2 diabetes mellitus with diabetic neuropathy, with long-term current use of insulin (HCC) 250.60 E11.40    357.2 Z79.4    V58.67    3. Essential hypertension, benign 401.1 I10   4. Morbid obesity due to excess calories ([facility]) 278.01 E66.01   5. Depression with anxiety - suboptimally controlled 300.4 F41.8 busPIRone (BUSPAR) 15 MG tablet  6. Elevated liver enzymes 790.5 R74.8   7. Hyperlipidemia LDL goal <70 272.4 E78.5   8. Lumbosacral radiculopathy 724.4 M54.17   9. Obstructive sleep apnea 327.23 G47.33    Increase buspar to 3 times daily  Increase omeprazole to 35m daily  Continue other medications as ordered  Follow up in 3 mos for routine visit. Keep appt with CTivis Ringeras scheduled.  Raegan Winders S. CPerlie Gold PGi [facility] [address] [place] [phone] (Monday-Friday 8 AM - 5 PM) [phone] 5 PM and follow prompts

## 2015-12-26 NOTE — Patient Instructions (Addendum)
Increase buspar to 3 times daily  Increase omeprazole to 40mg  daily  Continue other medications as ordered  Follow up in 3 mos for routine visit. Keep appt with Edison Pace as scheduled.

## 2015-12-29 ENCOUNTER — Ambulatory Visit: Payer: BLUE CROSS/BLUE SHIELD | Admitting: Pulmonary Disease

## 2015-12-31 ENCOUNTER — Telehealth: Payer: Self-pay | Admitting: *Deleted

## 2015-12-31 NOTE — Telephone Encounter (Signed)
Rx promethazine DM #180 ml take 5 ml q6hrs prn cough . No RF

## 2015-12-31 NOTE — Telephone Encounter (Signed)
Patient called and stated that he had just saw you and now has a hacking cough, sinus drainage and chest Congestion. He wants to know if you will call something in for him to take before it gets worse.  Please Advise.

## 2016-01-01 MED ORDER — PROMETHAZINE-DM 6.25-15 MG/5ML PO SYRP
5.0000 mL | ORAL_SOLUTION | Freq: Four times a day (QID) | ORAL | 0 refills | Status: DC | PRN
Start: 2016-01-01 — End: 2016-01-21

## 2016-01-01 NOTE — Telephone Encounter (Signed)
RX sent, left message on voicemail informing patient

## 2016-01-01 NOTE — Telephone Encounter (Signed)
Chrae, would you please contact patient since Im at [facility]. Thank you.

## 2016-01-07 ENCOUNTER — Other Ambulatory Visit: Payer: Self-pay | Admitting: *Deleted

## 2016-01-07 DIAGNOSIS — M5417 Radiculopathy, lumbosacral region: Secondary | ICD-10-CM

## 2016-01-07 MED ORDER — OXYCODONE-ACETAMINOPHEN 5-325 MG PO TABS: ORAL_TABLET | ORAL | 0 refills | Status: DC

## 2016-01-07 NOTE — Telephone Encounter (Signed)
Patient requested and will pick up 

## 2016-01-09 ENCOUNTER — Other Ambulatory Visit: Payer: Self-pay | Admitting: Internal Medicine

## 2016-01-21 ENCOUNTER — Telehealth: Payer: Self-pay

## 2016-01-21 MED ORDER — PROMETHAZINE-DM 6.25-15 MG/5ML PO SYRP: 5.0000 mL | ORAL_SOLUTION | Freq: Four times a day (QID) | ORAL | 0 refills | Status: DC | PRN

## 2016-01-21 NOTE — Telephone Encounter (Signed)
Patient called complaining of productive cough says the cough has been going on for a while and he is out of his cough medication wants to know if you will refill his script or if you want him to take something else    Dr. Montez Morita is out of office Please advise

## 2016-01-21 NOTE — Telephone Encounter (Signed)
Patient notified and agreed. Faxed Rx to pharmacy.  

## 2016-01-21 NOTE — Telephone Encounter (Signed)
Currently using promethazine DM. Refill with 8 Oz and current directions.

## 2016-02-03 ENCOUNTER — Ambulatory Visit: Payer: BLUE CROSS/BLUE SHIELD | Admitting: Pulmonary Disease

## 2016-02-12 ENCOUNTER — Other Ambulatory Visit: Payer: Self-pay | Admitting: Internal Medicine

## 2016-02-13 ENCOUNTER — Telehealth: Payer: Self-pay | Admitting: *Deleted

## 2016-02-13 ENCOUNTER — Other Ambulatory Visit: Payer: Self-pay | Admitting: *Deleted

## 2016-02-13 DIAGNOSIS — M5417 Radiculopathy, lumbosacral region: Secondary | ICD-10-CM

## 2016-02-13 MED ORDER — OXYCODONE-ACETAMINOPHEN 5-325 MG PO TABS: ORAL_TABLET | ORAL | 0 refills | Status: DC

## 2016-02-13 NOTE — Telephone Encounter (Signed)
Patient walked in asking for a substitute for Jardiance 25mg , per pt this medication is over $400.00. Pt is also asking for a script for Contour meter ( the new one) pt will discuss at his OV in feb, I gave pt samples of Jardiance 25 mg.

## 2016-02-13 NOTE — Telephone Encounter (Signed)
Patient requested and will pick up 

## 2016-02-21 ENCOUNTER — Other Ambulatory Visit: Payer: Self-pay | Admitting: Internal Medicine

## 2016-02-22 ENCOUNTER — Other Ambulatory Visit: Payer: Self-pay | Admitting: Internal Medicine

## 2016-02-22 DIAGNOSIS — Z794 Long term (current) use of insulin: Principal | ICD-10-CM

## 2016-02-22 DIAGNOSIS — R748 Abnormal levels of other serum enzymes: Secondary | ICD-10-CM

## 2016-02-22 DIAGNOSIS — E785 Hyperlipidemia, unspecified: Secondary | ICD-10-CM

## 2016-02-22 DIAGNOSIS — E1140 Type 2 diabetes mellitus with diabetic neuropathy, unspecified: Secondary | ICD-10-CM

## 2016-02-26 ENCOUNTER — Telehealth: Payer: Self-pay

## 2016-02-26 NOTE — Telephone Encounter (Signed)
-----   Message from [place], [place] sent at 02/22/2016  8:24 PM EST ----- Regarding: Lab appt Please reschedule lab appt to a date closer to his March appt with me. It is too early for him to have labs drawn at this time. Thanks

## 2016-02-26 NOTE — Telephone Encounter (Signed)
Left voicemail to reschedule appt closer to office visit.

## 2016-03-04 ENCOUNTER — Other Ambulatory Visit: Payer: BLUE CROSS/BLUE SHIELD

## 2016-03-08 ENCOUNTER — Other Ambulatory Visit: Payer: Self-pay

## 2016-03-08 ENCOUNTER — Ambulatory Visit (INDEPENDENT_AMBULATORY_CARE_PROVIDER_SITE_OTHER): Payer: BLUE CROSS/BLUE SHIELD | Admitting: Pharmacotherapy

## 2016-03-08 ENCOUNTER — Encounter: Payer: Self-pay | Admitting: Pharmacotherapy

## 2016-03-08 VITALS — BP 120/78 | HR 76 | Temp 98.1°F | Ht 66.0 in | Wt 262.0 lb

## 2016-03-08 DIAGNOSIS — E1140 Type 2 diabetes mellitus with diabetic neuropathy, unspecified: Secondary | ICD-10-CM | POA: Diagnosis not present

## 2016-03-08 DIAGNOSIS — I10 Essential (primary) hypertension: Secondary | ICD-10-CM | POA: Diagnosis not present

## 2016-03-08 DIAGNOSIS — Z794 Long term (current) use of insulin: Principal | ICD-10-CM

## 2016-03-08 LAB — COMPLETE METABOLIC PANEL WITH GFR
ALT: 70 U/L — ABNORMAL HIGH (ref 9–46)
AST: 64 U/L — ABNORMAL HIGH (ref 10–35)
Albumin: 4.3 g/dL (ref 3.6–5.1)
Alkaline Phosphatase: 87 U/L (ref 40–115)
BUN: 14 mg/dL (ref 7–25)
CO2: 22 mmol/L (ref 20–31)
Calcium: 9.5 mg/dL (ref 8.6–10.3)
Chloride: 104 mmol/L (ref 98–110)
Creat: 0.92 mg/dL (ref 0.70–1.33)
GFR, Est African American: >89 mL/min (ref >=60)
GFR, Est Non African American: >89 mL/min (ref >=60)
Glucose, Bld: 160 mg/dL — ABNORMAL HIGH (ref 65–99)
Potassium: 4.6 mmol/L (ref 3.5–5.3)
Sodium: 138 mmol/L (ref 135–146)
Total Bilirubin: 0.5 mg/dL (ref 0.2–1.2)
Total Protein: 7.8 g/dL (ref 6.1–8.1)

## 2016-03-08 LAB — HEMOGLOBIN A1C
Hgb A1c MFr Bld: 7.7 % — ABNORMAL HIGH (ref <5.7)
Mean Plasma Glucose: 174 mg/dL

## 2016-03-08 MED ORDER — GLUCOSE BLOOD VI STRP: ORAL_STRIP | 12 refills | Status: DC

## 2016-03-08 MED ORDER — DAPAGLIFLOZIN PROPANEDIOL 10 MG PO TABS: 10.0000 mg | ORAL_TABLET | Freq: Every day | ORAL | 6 refills | Status: DC

## 2016-03-08 MED ORDER — BAYER CONTOUR NEXT TEST VI STRP: ORAL_STRIP | 12 refills | Status: DC

## 2016-03-08 MED ORDER — BAYER CONTOUR NEXT EZ W/DEVICE KIT: PACK | 12 refills | Status: DC

## 2016-03-08 NOTE — Patient Instructions (Signed)
Increase Toujeo to 20 units daily.  Change Jardiance to [place] 10mg  once daily.  Need to exercise 30-45 minutes 5 x week.

## 2016-03-08 NOTE — Progress Notes (Signed)
  Subjective:    Barry [name] is a 61 y.o.white male who presents for follow-up of Type 2 diabetes mellitus.   Labs are due.  Last A1C on 12/23/15 was 7.8%  Needs a new blood glucose meter. He reports average BG: 130-210mg /dl.  Usually >150mg /dl. No hypoglycemia Has not been eating healthy.  Lots of snacking.  Polyphagia. Nocturia every hour. Polyuria all day. Drinking a lot of water - has polydipsia.  He is still taking Jardiance.  Insurance now prefers [place]. Current Toujeo dose is 16 units Continues Metformin.  No routine exercise. Wears glasses.  Some blurry vision.  Has scheduled an eye exam at [facility]. Denies problems with feet. Denies peripheral edema. Some dysuria.  Review of Systems A comprehensive review of systems was negative except for: Eyes: positive for contacts/glasses Genitourinary: positive for nocturia Integument/breast: positive for dryness Endocrine: positive for diabetic symptoms including blurry vision, increased fatigue, polydipsia, polyphagia, polyuria and skin dryness    Objective:    BP 120/78   Pulse 76   Temp 98.1 F (36.7 C) (Oral)   Ht 5\' 6"  (1.676 m)   Wt 262 lb (118.8 kg)   SpO2 97%   BMI 42.29 kg/m   General:  alert, cooperative and no distress  Oropharynx: normal findings: lips normal without lesions and gums healthy   Eyes:  negative findings: lids and lashes normal and corneas clear   Ears:  external ears normal        Lung: clear to auscultation bilaterally  Heart:  regular rate and rhythm     Extremities: extremities normal, atraumatic, no cyanosis or edema  Skin: dry     Neuro: mental status, speech normal, alert and oriented x3 and gait and station normal   Lab Review Glucose, Bld (mg/dL)  Date Value  73/40/3709 190 (H)  12/04/2015 186 (H)  09/22/2015 151 (H)   CO2 (mmol/L)  Date Value  12/23/2015 25  12/04/2015 26  09/22/2015 25   BUN (mg/dL)  Date Value  64/38/3818 22  12/04/2015 19  09/22/2015  17  06/18/2015 17  05/30/2015 20  01/13/2015 14   Creat (mg/dL)  Date Value  40/37/5436 1.15  12/04/2015 0.82  09/22/2015 0.94   will check A1C, CMP today    Assessment:    Diabetes Mellitus type II, under poor control.    Plan:    1.  Rx changes: Increase Toujeo 20 units daily  2.  Continue Metformin. 3.  Change Jardiance to [place] 10mg  once daily. 4.  Counseled on nutrition goals.  Counseled on portion control. 5.  Counseled on importance of SMBG. 6.  Counseled on real vs relative hypoglycemia. 7.  Counseled on need for routine exercise.  Goal is 30-45 minutes 5 x week. 8.  BP at goal <130/80.

## 2016-03-11 ENCOUNTER — Other Ambulatory Visit: Payer: Self-pay | Admitting: *Deleted

## 2016-03-11 DIAGNOSIS — E1165 Type 2 diabetes mellitus with hyperglycemia: Principal | ICD-10-CM

## 2016-03-11 DIAGNOSIS — Z794 Long term (current) use of insulin: Principal | ICD-10-CM

## 2016-03-11 DIAGNOSIS — E1122 Type 2 diabetes mellitus with diabetic chronic kidney disease: Secondary | ICD-10-CM

## 2016-03-11 MED ORDER — INSULIN PEN NEEDLE 32G X 4 MM MISC: 11 refills | Status: DC

## 2016-03-11 NOTE — Telephone Encounter (Signed)
[facility] [address] 

## 2016-03-15 ENCOUNTER — Other Ambulatory Visit: Payer: Self-pay | Admitting: *Deleted

## 2016-03-15 DIAGNOSIS — M5417 Radiculopathy, lumbosacral region: Secondary | ICD-10-CM

## 2016-03-15 MED ORDER — OXYCODONE-ACETAMINOPHEN 5-325 MG PO TABS: ORAL_TABLET | ORAL | 0 refills | Status: DC

## 2016-03-16 ENCOUNTER — Other Ambulatory Visit: Payer: Self-pay | Admitting: Internal Medicine

## 2016-03-23 ENCOUNTER — Other Ambulatory Visit: Payer: Self-pay | Admitting: Internal Medicine

## 2016-03-24 ENCOUNTER — Other Ambulatory Visit: Payer: BLUE CROSS/BLUE SHIELD

## 2016-03-24 DIAGNOSIS — E1140 Type 2 diabetes mellitus with diabetic neuropathy, unspecified: Secondary | ICD-10-CM

## 2016-03-24 DIAGNOSIS — E785 Hyperlipidemia, unspecified: Secondary | ICD-10-CM

## 2016-03-24 DIAGNOSIS — R748 Abnormal levels of other serum enzymes: Secondary | ICD-10-CM

## 2016-03-24 DIAGNOSIS — Z794 Long term (current) use of insulin: Principal | ICD-10-CM

## 2016-03-24 LAB — LIPID PANEL
CHOL/HDL RATIO: 3.2 ratio (ref <5.0)
Cholesterol: 94 mg/dL (ref <200)
HDL: 29 mg/dL — ABNORMAL LOW (ref >40)
LDL CALC: 48 mg/dL (ref <100)
Triglycerides: 83 mg/dL (ref <150)
VLDL: 17 mg/dL (ref <30)

## 2016-03-24 LAB — COMPLETE METABOLIC PANEL WITH GFR
ALBUMIN: 3.8 g/dL (ref 3.6–5.1)
ALT: 53 U/L — AB (ref 9–46)
AST: 45 U/L — AB (ref 10–35)
Alkaline Phosphatase: 80 U/L (ref 40–115)
BUN: 13 mg/dL (ref 7–25)
CHLORIDE: 102 mmol/L (ref 98–110)
CO2: 22 mmol/L (ref 20–31)
Calcium: 8.8 mg/dL (ref 8.6–10.3)
Creat: 0.9 mg/dL (ref 0.70–1.33)
GFR, Est African American: >89 mL/min (ref >=60)
GFR, Est Non African American: >89 mL/min (ref >=60)
GLUCOSE: 139 mg/dL — AB (ref 65–99)
POTASSIUM: 4.8 mmol/L (ref 3.5–5.3)
SODIUM: 136 mmol/L (ref 135–146)
Total Bilirubin: 0.5 mg/dL (ref 0.2–1.2)
Total Protein: 6.9 g/dL (ref 6.1–8.1)

## 2016-03-24 LAB — HEMOGLOBIN A1C
Hgb A1c MFr Bld: 7.6 % — ABNORMAL HIGH (ref <5.7)
Mean Plasma Glucose: 171 mg/dL

## 2016-03-26 ENCOUNTER — Ambulatory Visit (INDEPENDENT_AMBULATORY_CARE_PROVIDER_SITE_OTHER): Payer: BLUE CROSS/BLUE SHIELD | Admitting: Internal Medicine

## 2016-03-26 ENCOUNTER — Encounter: Payer: Self-pay | Admitting: Internal Medicine

## 2016-03-26 VITALS — BP 118/72 | HR 63 | Temp 97.9°F | Ht 66.0 in | Wt 266.4 lb

## 2016-03-26 DIAGNOSIS — R748 Abnormal levels of other serum enzymes: Secondary | ICD-10-CM

## 2016-03-26 DIAGNOSIS — E785 Hyperlipidemia, unspecified: Secondary | ICD-10-CM | POA: Diagnosis not present

## 2016-03-26 DIAGNOSIS — F418 Other specified anxiety disorders: Secondary | ICD-10-CM | POA: Diagnosis not present

## 2016-03-26 DIAGNOSIS — E1140 Type 2 diabetes mellitus with diabetic neuropathy, unspecified: Secondary | ICD-10-CM | POA: Diagnosis not present

## 2016-03-26 DIAGNOSIS — G4700 Insomnia, unspecified: Secondary | ICD-10-CM

## 2016-03-26 DIAGNOSIS — I10 Essential (primary) hypertension: Secondary | ICD-10-CM | POA: Diagnosis not present

## 2016-03-26 DIAGNOSIS — Z794 Long term (current) use of insulin: Secondary | ICD-10-CM | POA: Diagnosis not present

## 2016-03-26 DIAGNOSIS — K219 Gastro-esophageal reflux disease without esophagitis: Secondary | ICD-10-CM | POA: Diagnosis not present

## 2016-03-26 NOTE — Patient Instructions (Addendum)
Continue current medications as ordered  PLEASE RESUME CPAP USE AT BEDTIME to reduce headaches and skipping heart beats  Follow up with cardiology this month for heart disease for yearly visit  Keep appt with Edison Pace as scheduled  Continue diet and exercise program  Follow up in 3 mos for depression/anxiety, DM, HTN, CAD. Fasting labs prior to appt

## 2016-03-26 NOTE — Progress Notes (Signed)
Patient ID: Barry [name], male   DOB: 1955-08-28, 61 y.o.   MRN: [id number]    Location:  [facility] Place of Service: OFFICE  Chief Complaint  Patient presents with  . Medical Management of Chronic Issues    3 months routine visit    HPI:  61 yo male seen today for f/u. He reports intermittent palpitations that lasted 4 days a week ago. He did not seek medical attention. He reports increased work related stress.  He has breakthrough anxiety/worrying on sertraline 156m qhs. He does not sleep well even with ambien. He takes buspar TID as ordered  HTN - stable on lisinopril and lopressor. he is on an ASA daily  DM - BS checked fasting fluctuating. Fasting BS 180 today. Occasional low BS reactions  (weak/dizziness/shaking) relieved with eating and/or drinking. He takes toujeo 20 units daily and metformin. jardiance changed to fT[place] diabetic educator. A1c 7.6%. Urine microalbumin/Cr ratio 91. He is followed by CTivis Ringer  Hyperlipidemia - taking lipitor. LDL 48; HDL 29  Knee pain - stable on prn percocet. He has b/l knee pain. He has pain in his ankles at night when he tries to cross them. He states "my whole body hurts"  CAD - stable. No CP. Occasional SOB. Takes BB, ACEI and plavix. He saw cardio in March and was told he was stable and to f/u in 1 yr  Sleep apnea - he has not been wearing CPAP.  He gets a rash on face due to mask  Elevated liver enzymes - ALT 53; AST 45  GERD - stable on omeprazole.   Past Medical History:  Diagnosis Date  . Coronary artery disease   . MI (myocardial infarction) 04/23/2007   inferior wall  . [place]  . S/P CABG x 3 07/04/2012   LIMA to LAD, SVG to D1, SVG to PDA, EVH via right thigh  . Sleep apnea   . Type II or unspecified type diabetes mellitus without mention of complication, not stated as uncontrolled   . Unspecified essential hypertension     Past Surgical History:  Procedure Laterality Date  . CORONARY  ANGIOPLASTY WITH STENT PLACEMENT  04/23/2007   PCI and stent[facility] of mid RCA [email or web address] . CORONARY ARTERY BYPASS GRAFT N/A 07/04/2012   Procedure: CORONARY ARTERY BYPASS GRAFTING (CABG);  Surgeon: CRexene Alberts MD;  [place] [facility]  Service: Open Heart Surgery;  Laterality: N/A;  x3 using right greater saphenous vein and left internal mammary.   . INTRAOPERATIVE TRANSESOPHAGEAL ECHOCARDIOGRAM N/A 07/04/2012   Procedure: INTRAOPERATIVE TRANSESOPHAGEAL ECHOCARDIOGRAM;  Surgeon: CRexene Alberts MD;  [place]  Service: Open Heart Surgery;  Laterality: N/A;  . LEFT HEART CATHETERIZATION WITH CORONARY ANGIOGRAM N/A 06/25/2012   Procedure: LEFT HEART CATHETERIZATION WITH CORONARY ANGIOGRAM;  Surgeon: JLorretta Harp MD;  [facility];  Service: Cardiovascular;  Laterality: N/A;    Patient Care Team: MGildardo Cranker DO as PCP - General (Internal Medicine) JLorretta Harp MD as Consulting Physician (Cardiology)  Social History   Social History  . Marital status: Divorced    Spouse name: N/A  . Number of children: 1  . Years of education: N/A   [place]upational History  . [employer]  Social History Main Topics  . Smoking status: Never Smoker  . Smokeless tobacco: Never Used  . Alcohol use No  . Drug use: No  . Sexual activity: Not on file  Other Topics Concern  . Not on file   Social History Narrative  . No narrative on file     reports that he has never smoked. He has never used smokeless tobacco. He reports that he does not drink alcohol or use drugs.  Family History  Problem Relation Age of Onset  . Cancer Mother   . Diabetes Sister   . Diabetes Brother    Family Status  Relation Status  . Mother Deceased  . Father Deceased  . Sister Alive  . Brother Alive  . Daughter Alive  . Brother Alive  . Brother Alive  . Sister Alive  . Sister   . Brother      No Known Allergies  Medications: Patient's Medications  New  Prescriptions   No medications on file  Previous Medications   ALBUTEROL (PROVENTIL HFA;VENTOLIN HFA) 108 (90 BASE) MCG/ACT INHALER    One puff three times daily for wheezing or shortness of breath   ASPIRIN 81 MG CHEWABLE TABLET    Chew 81 mg by mouth once.    ATORVASTATIN (LIPITOR) 80 MG TABLET    Take 1 tablet (80 mg total) by mouth daily.   BAYER CONTOUR NEXT TEST TEST STRIP    Test blood sugar three times daily DX E11.22   BLOOD GLUCOSE MONITORING SUPPL (CONTOUR NEXT EZ MONITOR) W/DEVICE KIT    Test blood sugar three times daily E11.22   BLOOD GLUCOSE MONITORING SUPPL (CONTOUR NEXT EZ MONITOR) W/DEVICE KIT    Use to monitor blood glucose as directed   BUSPIRONE (BUSPAR) 15 MG TABLET    Take 1 tablet (15 mg total) by mouth 3 (three) times daily. For anxiety   CLOPIDOGREL (PLAVIX) 75 MG TABLET    take 1 tablet by mouth once daily   DAPAGLIFLOZIN PROPANEDIOL (FARXIGA) 10 MG TABS TABLET    Take 10 mg by mouth daily.   GLUCOSE BLOOD (BAYER CONTOUR NEXT TEST) TEST STRIP    Use as instructed   INSULIN GLARGINE (TOUJEO SOLOSTAR) 300 UNIT/ML SOPN    Inject 20 Units into the skin daily at 6 (six) AM.   INSULIN PEN NEEDLE 32G X 4 MM MISC    Use as Directed. Dx: E11.40   LISINOPRIL (PRINIVIL,ZESTRIL) 5 MG TABLET    Take 1 tablet (5 mg total) by mouth daily.   METFORMIN (GLUCOPHAGE) 1000 MG TABLET    take 1 tablet by mouth twice a day   METOPROLOL TARTRATE (LOPRESSOR) 25 MG TABLET    take 1/2 tablet by mouth twice a day   OMEPRAZOLE (PRILOSEC) 40 MG CAPSULE    Take 1 capsule (40 mg total) by mouth daily.   OXYCODONE-ACETAMINOPHEN (ROXICET) 5-325 MG TABLET    Take 2 tablet in the morning and one tablet in the evening as needed for pain   PROMETHAZINE-DEXTROMETHORPHAN (PROMETHAZINE-DM) 6.25-15 MG/5ML SYRUP    Take 5 mLs by mouth every 6 (six) hours as needed for cough.   SERTRALINE (ZOLOFT) 100 MG TABLET    Take 1.5 tablets (150 mg total) by mouth at bedtime.   VIAGRA 100 MG TABLET    take 1 tablet by  mouth once daily as directed   ZOLPIDEM (AMBIEN) 10 MG TABLET    take 1 tablet by mouth at bedtime  Modified Medications   No medications on file  Discontinued Medications   No medications on file    Review of Systems  Constitutional: Positive for fatigue.  HENT: Positive for postnasal drip.   Musculoskeletal: Positive  for arthralgias and back pain.  Psychiatric/Behavioral: Positive for sleep disturbance. The patient is nervous/anxious.     Vitals:   03/26/16 0824  BP: 118/72  Pulse: 63  Temp: 97.9 F (36.6 C)  TempSrc: Oral  SpO2: 94%  Weight: 266 lb 6.4 oz (120.8 kg)  Height: _0  (1.676 m)   Body mass index is 43 kg/m.  Physical Exam  Constitutional: He is oriented to person, place, and time. He appears well-developed and well-nourished.  No conversational dyspnea. Looks tired in NAD  HENT:  Mouth/Throat: Oropharynx is clear and moist.  Eyes: Pupils are equal, round, and reactive to light. No scleral icterus.  Neck: Neck supple. Carotid bruit is not present.  Cardiovascular: Normal rate, regular rhythm, normal heart sounds and intact distal pulses.  Exam reveals no gallop and no friction rub.   No murmur heard. no distal LE swelling. No calf TTP  Pulmonary/Chest: Effort normal. He has decreased breath sounds. He has no wheezes. He has no rhonchi. He has no rales. He exhibits no tenderness.  Abdominal: Soft. Bowel sounds are normal. He exhibits no distension, no abdominal bruit, no pulsatile midline mass and no mass. There is no tenderness. There is no rebound and no guarding.  obese  Musculoskeletal: He exhibits edema and tenderness.  Lymphadenopathy:    He has no cervical adenopathy.  Neurological: He is alert and oriented to person, place, and time.  Skin: Skin is warm and dry. No rash noted.  Psychiatric: He has a normal mood and affect. His behavior is normal. Judgment and thought content normal.     Labs reviewed: Appointment on 03/24/2016  Component Date  Value Ref Range Status  . Sodium 03/24/2016 136  135 - 146 mmol/L Final  . Potassium 03/24/2016 4.8  3.5 - 5.3 mmol/L Final  . Chloride 03/24/2016 102  98 - 110 mmol/L Final  . CO2 03/24/2016 22  20 - 31 mmol/L Final  . Glucose, Bld 03/24/2016 139* 65 - 99 mg/dL Final  . BUN 03/24/2016 13  7 - 25 mg/dL Final  . Creat 03/24/2016 0.90  0.70 - 1.33 mg/dL Final   Comment:   For patients > or = 59 years of age: The upper reference limit for Creatinine is approximately 13% higher for people identified as African-American.     . Total Bilirubin 03/24/2016 0.5  0.2 - 1.2 mg/dL Final  . Alkaline Phosphatase 03/24/2016 80  40 - 115 U/L Final  . AST 03/24/2016 45* 10 - 35 U/L Final  . ALT 03/24/2016 53* 9 - 46 U/L Final  . Total Protein 03/24/2016 6.9  6.1 - 8.1 g/dL Final  . Albumin 03/24/2016 3.8  3.6 - 5.1 g/dL Final  . Calcium 03/24/2016 8.8  8.6 - 10.3 mg/dL Final  . GFR, Est African American 03/24/2016 >89  >=60 mL/min Final  . GFR, Est Non African American 03/24/2016 >89  >=60 mL/min Final  . Cholesterol 03/24/2016 94  <200 mg/dL Final  . Triglycerides 03/24/2016 83  <150 mg/dL Final  . HDL 03/24/2016 29* >40 mg/dL Final  . Total CHOL/HDL Ratio 03/24/2016 3.2  <5.0 Ratio Final  . VLDL 03/24/2016 17  <30 mg/dL Final  . LDL Cholesterol 03/24/2016 48  <100 mg/dL Final  . Hgb A1c MFr Bld 03/24/2016 7.6* <5.7 % Final   Comment:   For someone without known diabetes, a hemoglobin A1c value of 6.5% or greater indicates that they may have diabetes and this should be confirmed with a follow-up test.  For someone with known diabetes, a value <7% indicates that their diabetes is well controlled and a value greater than or equal to 7% indicates suboptimal control. A1c targets should be individualized based on duration of diabetes, age, comorbid conditions, and other considerations.   Currently, no consensus exists for use of hemoglobin A1c for diagnosis of diabetes for children.     .  Mean Plasma Glucose 03/24/2016 171  mg/dL Final  Orders Only on 03/08/2016  Component Date Value Ref Range Status  . Hgb A1c MFr Bld 03/08/2016 7.7* <5.7 % Final   Comment:   For someone without known diabetes, a hemoglobin A1c value of 6.5% or greater indicates that they may have diabetes and this should be confirmed with a follow-up test.   For someone with known diabetes, a value <7% indicates that their diabetes is well controlled and a value greater than or equal to 7% indicates suboptimal control. A1c targets should be individualized based on duration of diabetes, age, comorbid conditions, and other considerations.   Currently, no consensus exists for use of hemoglobin A1c for diagnosis of diabetes for children.     . Mean Plasma Glucose 03/08/2016 174  mg/dL Final  . Sodium 03/08/2016 138  135 - 146 mmol/L Final  . Potassium 03/08/2016 4.6  3.5 - 5.3 mmol/L Final  . Chloride 03/08/2016 104  98 - 110 mmol/L Final  . CO2 03/08/2016 22  20 - 31 mmol/L Final  . Glucose, Bld 03/08/2016 160* 65 - 99 mg/dL Final  . BUN 03/08/2016 14  7 - 25 mg/dL Final  . Creat 03/08/2016 0.92  0.70 - 1.33 mg/dL Final   Comment:   For patients > or = 59 years of age: The upper reference limit for Creatinine is approximately 13% higher for people identified as African-American.     . Total Bilirubin 03/08/2016 0.5  0.2 - 1.2 mg/dL Final  . Alkaline Phosphatase 03/08/2016 87  40 - 115 U/L Final  . AST 03/08/2016 64* 10 - 35 U/L Final  . ALT 03/08/2016 70* 9 - 46 U/L Final  . Total Protein 03/08/2016 7.8  6.1 - 8.1 g/dL Final  . Albumin 03/08/2016 4.3  3.6 - 5.1 g/dL Final  . Calcium 03/08/2016 9.5  8.6 - 10.3 mg/dL Final  . GFR, Est African American 03/08/2016 >89  >=60 mL/min Final  . GFR, Est Non African American 03/08/2016 >89  >=60 mL/min Final    No results found.   Assessment/Plan   ICD-9-CM ICD-10-CM   1. Depression with anxiety 300.4 F41.8   2. Type 2 diabetes mellitus with  diabetic neuropathy, with long-term current use of insulin (HCC) 250.60 E11.40    357.2 Z79.4    V58.67    3. Elevated liver enzymes 790.5 R74.8 Hepatic Function Panel  4. Hyperlipidemia LDL goal <70 272.4 E78.5 Lipid Panel  5. Gastroesophageal reflux disease, esophagitis presence not specified 530.81 K21.9   6. Essential hypertension, benign 401.1 I10   7. Morbid obesity due to excess calories (HCC) 278.01 E66.01   8. Insomnia, unspecified type 780.52 G47.00     Samples of farxiga given today  Continue current medications as ordered  PLEASE RESUME CPAP USE AT BEDTIME to reduce headaches and skipping heart beats  Follow up with cardiology this month for heart disease for yearly visit  Keep appt with Tivis Ringer as scheduled  Continue diet and exercise program  Follow up in 3 mos for depression/anxiety, DM, HTN, CAD. Fasting labs prior to appt  Senica Crall S. Perlie Gold  [facility] [address] [place], [place] [address] [phone] Cell (Monday-Friday 8 AM - 5 PM) [phone] After 5 PM and follow prompts

## 2016-04-01 ENCOUNTER — Other Ambulatory Visit: Payer: BLUE CROSS/BLUE SHIELD

## 2016-04-16 ENCOUNTER — Other Ambulatory Visit: Payer: Self-pay

## 2016-04-16 ENCOUNTER — Other Ambulatory Visit: Payer: Self-pay | Admitting: *Deleted

## 2016-04-16 DIAGNOSIS — F418 Other specified anxiety disorders: Secondary | ICD-10-CM

## 2016-04-16 DIAGNOSIS — M5417 Radiculopathy, lumbosacral region: Secondary | ICD-10-CM

## 2016-04-16 MED ORDER — ALBUTEROL SULFATE HFA 108 (90 BASE) MCG/ACT IN AERS: INHALATION_SPRAY | RESPIRATORY_TRACT | 5 refills | Status: DC

## 2016-04-16 MED ORDER — OXYCODONE-ACETAMINOPHEN 5-325 MG PO TABS: ORAL_TABLET | ORAL | 0 refills | Status: DC

## 2016-04-16 MED ORDER — INSULIN GLARGINE 300 UNIT/ML ~~LOC~~ SOPN: 20.0000 [IU] | PEN_INJECTOR | Freq: Every day | SUBCUTANEOUS | 5 refills | Status: DC

## 2016-04-16 MED ORDER — SERTRALINE HCL 100 MG PO TABS: 150.0000 mg | ORAL_TABLET | Freq: Every day | ORAL | 1 refills | Status: DC

## 2016-04-16 MED ORDER — ZOLPIDEM TARTRATE 10 MG PO TABS: 10.0000 mg | ORAL_TABLET | Freq: Every day | ORAL | 0 refills | Status: DC

## 2016-04-16 NOTE — Telephone Encounter (Signed)
Patient informed rx signed and ready for pickup  

## 2016-04-16 NOTE — Telephone Encounter (Signed)
patient requested will pick up. 

## 2016-04-16 NOTE — Telephone Encounter (Signed)
Patient requested to be faxed to pharmacy 

## 2016-04-16 NOTE — Telephone Encounter (Signed)
Patient walked in office and requested RX be sent to pharmacy.

## 2016-05-07 ENCOUNTER — Other Ambulatory Visit: Payer: Self-pay | Admitting: Internal Medicine

## 2016-05-13 NOTE — Telephone Encounter (Signed)
Opened in error

## 2016-05-14 ENCOUNTER — Other Ambulatory Visit: Payer: Self-pay

## 2016-05-14 DIAGNOSIS — M5417 Radiculopathy, lumbosacral region: Secondary | ICD-10-CM

## 2016-05-14 MED ORDER — OXYCODONE-ACETAMINOPHEN 5-325 MG PO TABS: ORAL_TABLET | ORAL | 0 refills | Status: DC

## 2016-05-14 NOTE — Telephone Encounter (Signed)
Spoke with patient and advised rx ready for pick-up and it will be at the front desk.  

## 2016-05-25 ENCOUNTER — Telehealth: Payer: Self-pay | Admitting: *Deleted

## 2016-05-25 MED ORDER — AMPICILLIN 500 MG PO CAPS: 500.0000 mg | ORAL_CAPSULE | Freq: Three times a day (TID) | ORAL | 0 refills | Status: DC

## 2016-05-25 NOTE — Telephone Encounter (Signed)
Patient notified and Rx faxed to pharmacy.  

## 2016-05-25 NOTE — Telephone Encounter (Signed)
Patient called and stated that he has a broken tooth and its swollen and sore. No fever.  Broke on the weekend.  The dentist will not do anything about it until he gets some antibiotics in him to get the swelling and soreness down. Please Advise. (Dr. Montez Morita Patient)

## 2016-05-25 NOTE — Telephone Encounter (Signed)
Call in Ampicillin 500 mg (30) one 3 times daily to treat infection.

## 2016-05-29 ENCOUNTER — Other Ambulatory Visit: Payer: Self-pay | Admitting: Internal Medicine

## 2016-06-03 ENCOUNTER — Other Ambulatory Visit: Payer: BLUE CROSS/BLUE SHIELD

## 2016-06-07 ENCOUNTER — Ambulatory Visit: Payer: BLUE CROSS/BLUE SHIELD | Admitting: Pharmacotherapy

## 2016-06-15 ENCOUNTER — Other Ambulatory Visit: Payer: Self-pay | Admitting: *Deleted

## 2016-06-15 DIAGNOSIS — M5417 Radiculopathy, lumbosacral region: Secondary | ICD-10-CM

## 2016-06-15 MED ORDER — OXYCODONE-ACETAMINOPHEN 5-325 MG PO TABS: ORAL_TABLET | ORAL | 0 refills | Status: DC

## 2016-06-15 NOTE — Telephone Encounter (Signed)
Patient requested and will pick up Thursday. Patient has taken a job in [employer].

## 2016-06-16 ENCOUNTER — Other Ambulatory Visit: Payer: Self-pay | Admitting: Internal Medicine

## 2016-06-16 DIAGNOSIS — K219 Gastro-esophageal reflux disease without esophagitis: Secondary | ICD-10-CM

## 2016-06-18 IMAGING — CT CT CHEST W/O CM
3 of 4 series · 17 of 30 positions shown, 19 images · non-contrast
Comparison: 01/15/2015

CLINICAL DATA: Sarcoidosis. Shortness of breath. Left-sided chest
pain. Prior CABG.

EXAM:
CT CHEST WITHOUT CONTRAST
TECHNIQUE: Multidetector CT imaging of the chest was performed following the
standard protocol without IV contrast.

[Series 3: chest w/o · axial · non-contrast · 0.79mm/px · z∈[-282,-62]mm · 5 of 67 slices shown, 7 images]
[im 12/67  mediastinal]
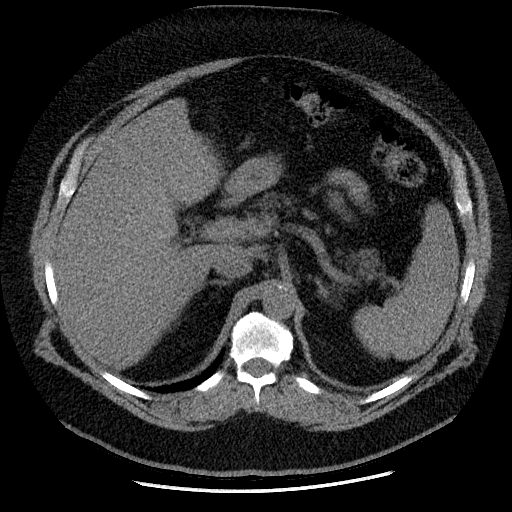
[im 12/67  lung]
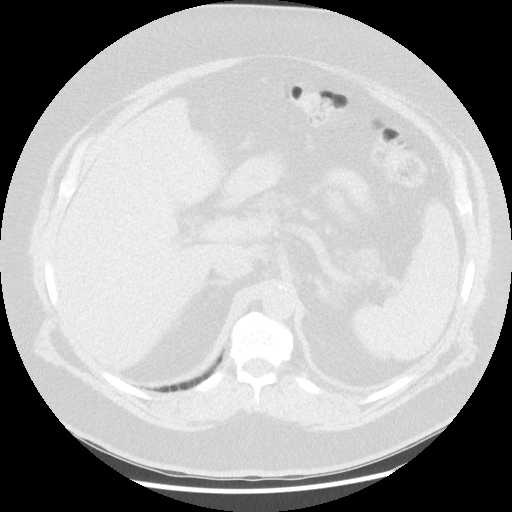
[im 23/67  lung]
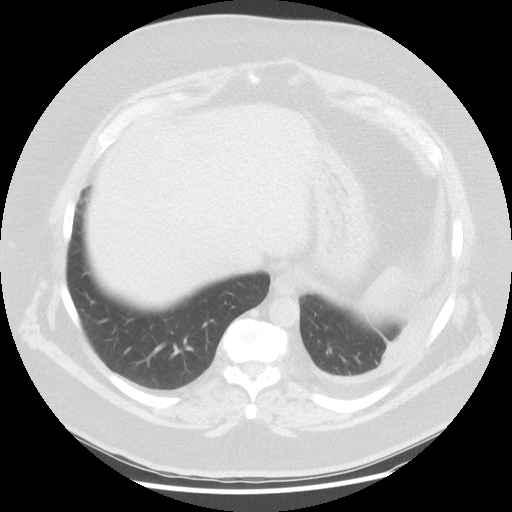
[im 34/67  lung]
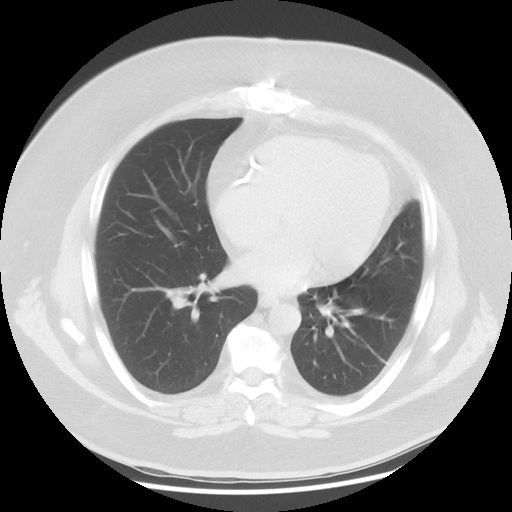
[im 45/67  lung]
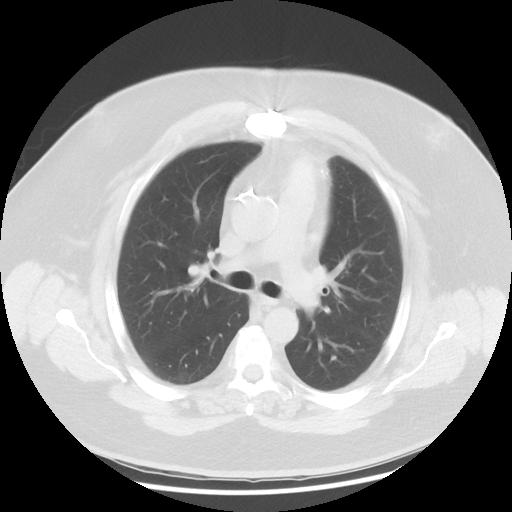
[im 56/67  mediastinal]
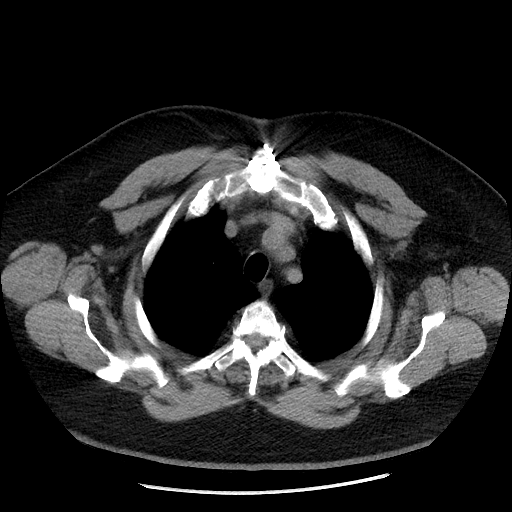
[im 56/67  lung]
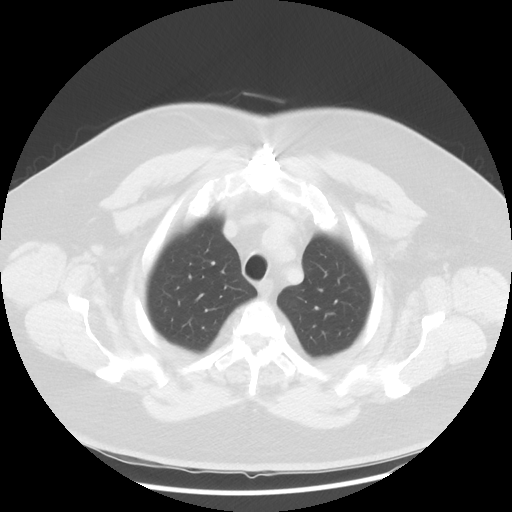

[Series 4: lung windows · axial · 0.79mm/px · z∈[-272,-78]mm · 4 of 67 slices shown]
[im 14/67  lung]
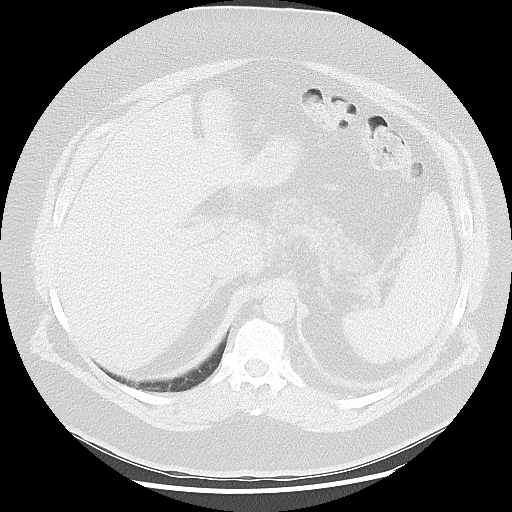
[im 27/67  lung]
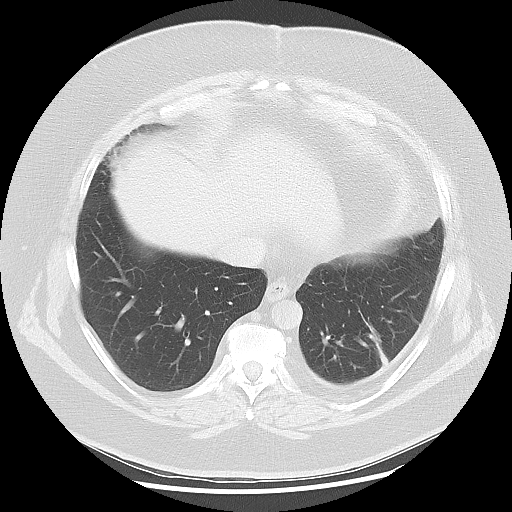
[im 40/67  lung]
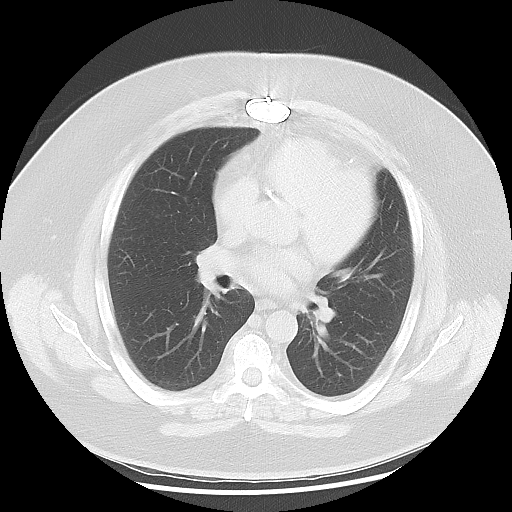
[im 53/67  lung]
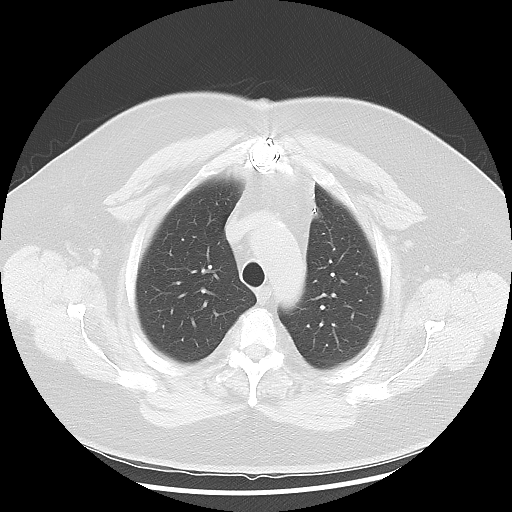

[Series 602: sagittal body · sagittal · 0.79mm/px · 8 of 163 slices shown]
[im 12/163  mediastinal]
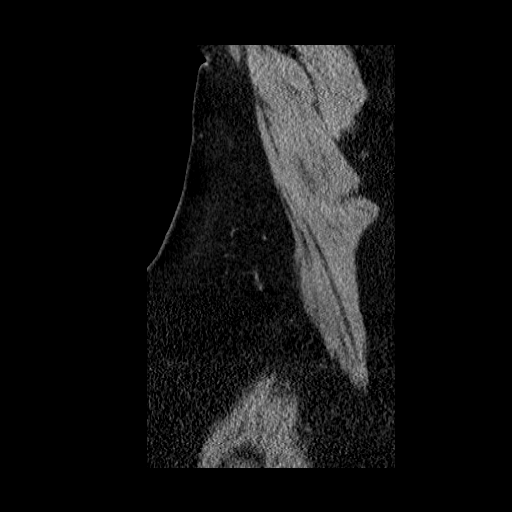
[im 35/163  mediastinal]
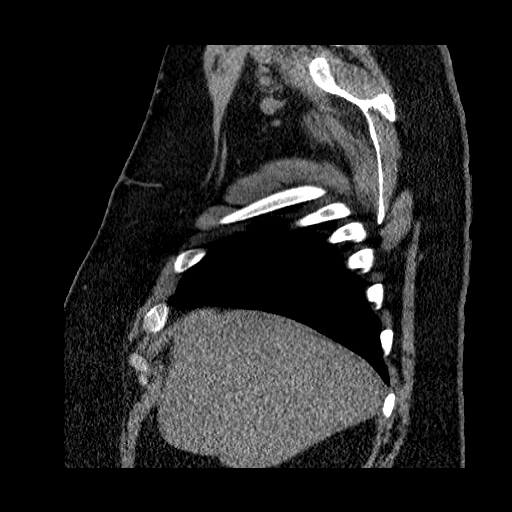
[im 58/163  mediastinal]
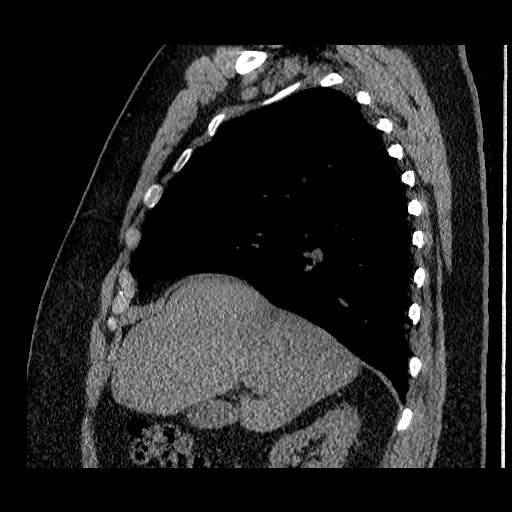
[im 70/163  mediastinal]
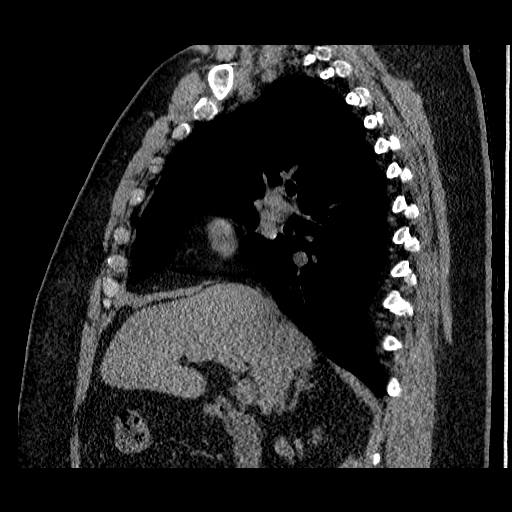
[im 93/163  mediastinal]
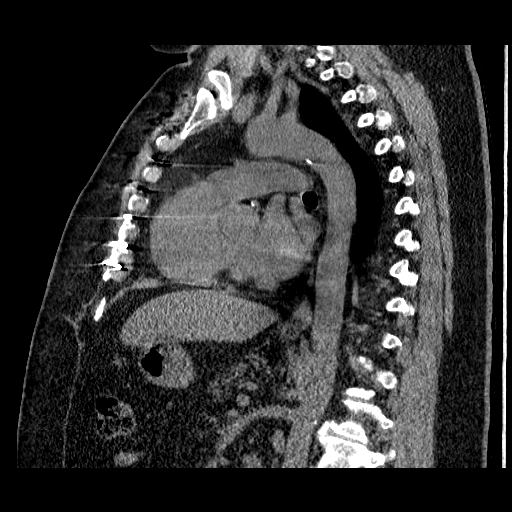
[im 105/163  mediastinal]
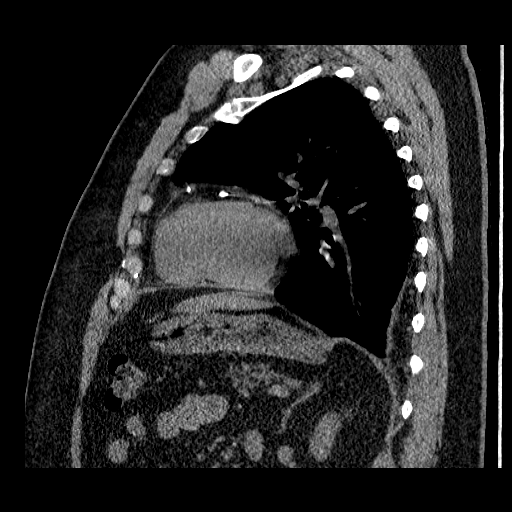
[im 128/163  mediastinal]
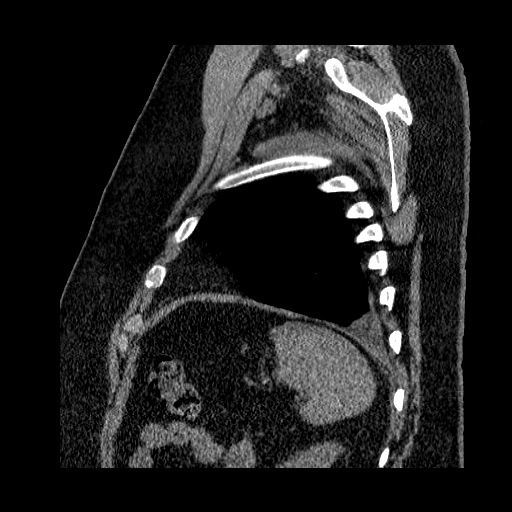
[im 151/163  mediastinal]
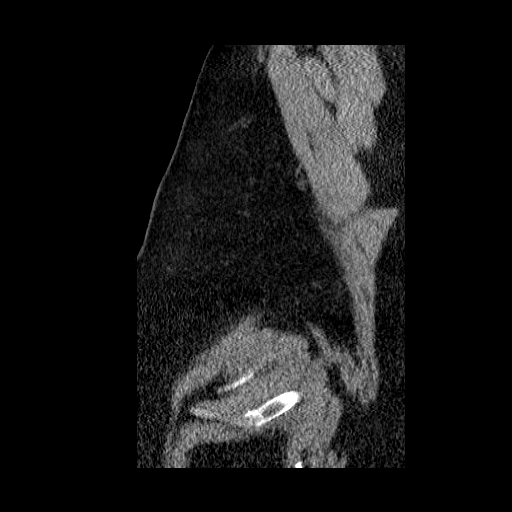

[17 of 30 positions shown; findings below may reference images not displayed]

FINDINGS: Mediastinum/Nodes: Prior CABG. Mild cardiomegaly. Coronary
atherosclerosis. No pathologic thoracic adenopathy.

Lungs/Pleura: The peripheral scarring or atelectasis in the left
lower lobe with adjacent fatty pleural thickening. No significant
peribronchovascular or paraseptal nodularity.

Upper abdomen: Unremarkable

Musculoskeletal: Thoracic spondylosis.
IMPRESSION: 1. No CT evidence of active sarcoid.
2. There is some peripheral volume loss in the left lower lobe due
to atelectasis or scarring, with adjacent fatty thickening of the
pleural space on the left, likely chronic. Both of these findings
are new compared to 1773, although similar pleural thickening was
present back in 4045.
3. Mild cardiomegaly.  Coronary atherosclerosis.  Prior CABG.

## 2016-07-01 ENCOUNTER — Other Ambulatory Visit: Payer: BLUE CROSS/BLUE SHIELD

## 2016-07-05 ENCOUNTER — Ambulatory Visit: Payer: BLUE CROSS/BLUE SHIELD | Admitting: Pharmacotherapy

## 2016-07-12 ENCOUNTER — Other Ambulatory Visit: Payer: BLUE CROSS/BLUE SHIELD

## 2016-07-14 ENCOUNTER — Ambulatory Visit: Payer: BLUE CROSS/BLUE SHIELD | Admitting: Internal Medicine

## 2016-07-15 ENCOUNTER — Other Ambulatory Visit: Payer: Self-pay | Admitting: *Deleted

## 2016-07-15 DIAGNOSIS — M5417 Radiculopathy, lumbosacral region: Secondary | ICD-10-CM

## 2016-07-15 MED ORDER — OXYCODONE-ACETAMINOPHEN 5-325 MG PO TABS: ORAL_TABLET | ORAL | 0 refills | Status: DC

## 2016-07-15 NOTE — Telephone Encounter (Signed)
Patient requested and will pick up 

## 2016-09-08 ENCOUNTER — Other Ambulatory Visit: Payer: Self-pay | Admitting: *Deleted

## 2016-09-08 DIAGNOSIS — M5417 Radiculopathy, lumbosacral region: Secondary | ICD-10-CM

## 2016-09-08 MED ORDER — OXYCODONE-ACETAMINOPHEN 5-325 MG PO TABS: ORAL_TABLET | ORAL | 0 refills | Status: DC

## 2016-09-08 NOTE — Telephone Encounter (Signed)
Patient requested and will pick up. Patient is aware that he is to make an appointment, but patient changed jobs and he is waiting for his new insurance to kick in.

## 2016-09-10 ENCOUNTER — Other Ambulatory Visit: Payer: Self-pay | Admitting: Internal Medicine

## 2016-09-17 ENCOUNTER — Other Ambulatory Visit: Payer: Self-pay | Admitting: Internal Medicine

## 2016-09-17 DIAGNOSIS — F418 Other specified anxiety disorders: Secondary | ICD-10-CM

## 2016-10-19 ENCOUNTER — Other Ambulatory Visit: Payer: Self-pay | Admitting: Internal Medicine

## 2016-10-19 DIAGNOSIS — F418 Other specified anxiety disorders: Secondary | ICD-10-CM

## 2016-10-28 ENCOUNTER — Other Ambulatory Visit: Payer: Self-pay | Admitting: *Deleted

## 2016-10-28 DIAGNOSIS — M5417 Radiculopathy, lumbosacral region: Secondary | ICD-10-CM

## 2016-10-28 MED ORDER — OXYCODONE-ACETAMINOPHEN 5-325 MG PO TABS: ORAL_TABLET | ORAL | 0 refills | Status: DC

## 2016-10-28 NOTE — Telephone Encounter (Signed)
Patient requested and will pick up. Appointment scheduled. [place]CCSRS Database Checked.

## 2016-11-03 ENCOUNTER — Other Ambulatory Visit: Payer: Self-pay | Admitting: Internal Medicine

## 2016-11-03 DIAGNOSIS — K219 Gastro-esophageal reflux disease without esophagitis: Secondary | ICD-10-CM

## 2016-11-03 DIAGNOSIS — F418 Other specified anxiety disorders: Secondary | ICD-10-CM

## 2016-11-16 ENCOUNTER — Ambulatory Visit (INDEPENDENT_AMBULATORY_CARE_PROVIDER_SITE_OTHER): Payer: BLUE CROSS/BLUE SHIELD | Admitting: Internal Medicine

## 2016-11-16 ENCOUNTER — Encounter: Payer: Self-pay | Admitting: Internal Medicine

## 2016-11-16 VITALS — BP 130/76 | HR 74 | Temp 98.1°F | Ht 66.0 in | Wt 267.0 lb

## 2016-11-16 DIAGNOSIS — G4700 Insomnia, unspecified: Secondary | ICD-10-CM

## 2016-11-16 DIAGNOSIS — I10 Essential (primary) hypertension: Secondary | ICD-10-CM | POA: Diagnosis not present

## 2016-11-16 DIAGNOSIS — Z794 Long term (current) use of insulin: Secondary | ICD-10-CM

## 2016-11-16 DIAGNOSIS — E1140 Type 2 diabetes mellitus with diabetic neuropathy, unspecified: Secondary | ICD-10-CM | POA: Diagnosis not present

## 2016-11-16 DIAGNOSIS — Z23 Encounter for immunization: Secondary | ICD-10-CM | POA: Diagnosis not present

## 2016-11-16 DIAGNOSIS — F418 Other specified anxiety disorders: Secondary | ICD-10-CM

## 2016-11-16 DIAGNOSIS — G4733 Obstructive sleep apnea (adult) (pediatric): Secondary | ICD-10-CM

## 2016-11-16 DIAGNOSIS — E785 Hyperlipidemia, unspecified: Secondary | ICD-10-CM | POA: Diagnosis not present

## 2016-11-16 MED ORDER — METOPROLOL TARTRATE 25 MG PO TABS: 12.5000 mg | ORAL_TABLET | Freq: Two times a day (BID) | ORAL | 1 refills | Status: DC

## 2016-11-16 MED ORDER — ZOLPIDEM TARTRATE 10 MG PO TABS: 10.0000 mg | ORAL_TABLET | Freq: Every day | ORAL | 0 refills | Status: DC

## 2016-11-16 MED ORDER — METFORMIN HCL 1000 MG PO TABS: 1000.0000 mg | ORAL_TABLET | Freq: Two times a day (BID) | ORAL | 1 refills | Status: DC

## 2016-11-16 NOTE — Patient Instructions (Addendum)
Flu shot given today  RETURN TO OFFICE FOR FASTING LABS next available appt  Continue current medications as ordered  Please setup appt to see Edison Pace for diabetic management  Follow up in 3 mos for DM, HTN, sleep apnea, hyperlipidemia

## 2016-11-16 NOTE — Progress Notes (Signed)
Patient ID: Barry [name], male   DOB: 01/14/1956, 61 y.o.   MRN: [id number]    Location:  [facility] Place of Service: OFFICE  Chief Complaint  Patient presents with  . Medical Management of Chronic Issues    7-8 month follow-up(patient was without insurance), DM foot exam and A1c DUE   . Medication Refill    Refill Ambien, Metformin, and Metoprolol  . Immunizations    Flu vaccine today   . Health Maintenance    Refused Hep C & HIV screening,   . Medication Management    Out of some medications x 1 month, not taking Farxiga due to cost, patient was [employer]      HPI:  61 yo male seen today after long hiatus due to lack of insurance. He now has a new property in [place] and is loving new position.  He has breakthrough anxiety/worrying on sertraline 173m qhs. Sleep interrupted and needs new rx for ambien. He takes buspar 1.5 tabs BID  HTN - stable on lisinopril and lopressor. he is on an ASA daily  DM - BS checked fasting fluctuating. Fasting BS 180 today. Occasional low BS reactions  (weak/dizziness/shaking) relieved with eating and/or drinking. He takes toujeo 20 units daily and metformin. No longer on farxiga 2/2 cost. A1c 7.6%. Urine microalbumin/Cr ratio 91. He is followed by CTivis Ringer  Hyperlipidemia - taking lipitor. LDL 48; HDL 29  Knee pain - stable on percocet 4 tabs daily. He has b/l knee pain. He has pain in his ankles at night when he tries to cross them. He states "my whole body hurts"  CAD - stable. No CP. Occasional SOB. Takes BB, ACEI and plavix. He has not seen Cardio Dr BGwenlyn Foundfor f/u  Sleep apnea - he has not been wearing CPAP as mask broke.  He gets a rash on face due to mask. He has excessive daytime sleepiness.   Elevated liver enzymes - ALT 53; AST 45  GERD - stable on omeprazole.   Past Medical History:  Diagnosis Date  . Coronary artery disease   . [place] 04[place]   inferior wall  . Morbid obesity ([facility] 06/26/2012    . S/P CABG x 3 07/04/2012   LIMA to LAD, SVG to D1, SVG to PDA, EVH via right thigh  . Sleep apnea   . Type II or unspecified type diabetes mellitus without mention of complication, not stated as uncontrolled   . Unspecified essential hypertension     Past Surgical History:  Procedure Laterality Date  . CORONARY ANGIOPLASTY WITH STENT PLACEMENT  04/23/2007   PCI and stenting of mid RCA [facility] . CORONARY ARTERY BYPASS GRAFT N/A 07/04/2012   Procedure: CORONARY ARTERY BYPASS GRAFTING (CABG);  Surgeon: CRexene Alberts MD;  [place]  Service: Open Heart Surgery;  Laterality: N/A;  x3 using right greater saphenous vein and left internal mammary.   . INTRAOPERATIVE TRANSESOPHAGEAL ECHOCARDIOGRAM N/A 07/04/2012   Procedure: INTRAOPERATIVE TRANSESOPHAGEAL ECHOCARDIOGRAM;  Surgeon: CRexene Alberts MD;  [place] [facility]  Service: Open Heart Surgery;  Laterality: N/A;  . LEFT HEART CATHETERIZATION WITH CORONARY ANGIOGRAM N/A 06/25/2012   Procedure: LEFT HEART CATHETERIZATION WITH CORONARY ANGIOGRAM;  Surgeon: JLorretta Harp MD;  [facility];  Service: Cardiovascular;  Laterality: N/A;    Patient Care Team: CGildardo Cranker DO as PCP - General (Internal Medicine) BLorretta Harp MD as Consulting Physician (Cardiology)  Social History  Social History  . Marital status: Divorced    Spouse name: N/A  . Number of children: 1  . Years of education: N/A   Occupational History  . [employer]   Social History Main Topics  . Smoking status: Never Smoker  . Smokeless tobacco: Never Used  . Alcohol use No  . Drug use: No  . Sexual activity: Not on file   Other Topics Concern  . Not on file   Social History Narrative  . No narrative on file     reports that he has never smoked. He has never used smokeless tobacco. He reports that he does not drink alcohol or use drugs.  Family History  Problem Relation Age of Onset  . Cancer Mother    . Diabetes Sister   . Diabetes Brother    Family Status  Relation Status  . Mother Deceased  . Father Deceased  . Sister Alive  . Brother Alive  . Daughter Alive  . Brother Alive  . Brother Alive  . Sister Alive  . Sister (Not Specified)  . Brother (Not Specified)     No Known Allergies  Medications: Patient's Medications  New Prescriptions   No medications on file  Previous Medications   ALBUTEROL (PROVENTIL HFA;VENTOLIN HFA) 108 (90 BASE) MCG/ACT INHALER    One puff three times daily for wheezing or shortness of breath   ASPIRIN 81 MG CHEWABLE TABLET    Chew 81 mg by mouth once.    ATORVASTATIN (LIPITOR) 80 MG TABLET    TAKE 1 TABLET BY MOUTH DAILY   BLOOD GLUCOSE MONITORING SUPPL (CONTOUR NEXT EZ MONITOR) W/DEVICE KIT    Test blood sugar three times daily E11.22   BUSPIRONE (BUSPAR) 15 MG TABLET    TAKE 1 TABLET BY MOUTH THREE TIMES DAILY FOR ANXIETY   CLOPIDOGREL (PLAVIX) 75 MG TABLET    take 1 tablet by mouth once daily   GLUCOSE BLOOD (BAYER CONTOUR NEXT TEST) TEST STRIP    Use as instructed   INSULIN GLARGINE (TOUJEO SOLOSTAR) 300 UNIT/ML SOPN    Inject 20 Units into the skin daily at 6 (six) AM.   INSULIN PEN NEEDLE 32G X 4 MM MISC    Use as Directed. Dx: E11.40   LISINOPRIL (PRINIVIL,ZESTRIL) 5 MG TABLET    TAKE 1 TABLET BY MOUTH DAILY   METFORMIN (GLUCOPHAGE) 1000 MG TABLET    take 1 tablet by mouth twice a day   METOPROLOL TARTRATE (LOPRESSOR) 25 MG TABLET    take 1/2 tablet by mouth twice a day   OMEPRAZOLE (PRILOSEC) 40 MG CAPSULE    TAKE 1 CAPSULE BY MOUTH DAILY   OXYCODONE-ACETAMINOPHEN (ROXICET) 5-325 MG TABLET    Take 2 tablet in the morning and one tablet in the evening as needed for pain   SERTRALINE (ZOLOFT) 100 MG TABLET    TAKE 1 AND 1/2 TABLETS BY MOUTH AT BEDTIME   SILDENAFIL (VIAGRA) 100 MG TABLET    take 1 tablet by mouth once daily as directed   ZOLPIDEM (AMBIEN) 10 MG TABLET    take 1 tablet by mouth at bedtime  Modified Medications   No  medications on file  Discontinued Medications   AMPICILLIN (PRINCIPEN) 500 MG CAPSULE    Take 1 capsule (500 mg total) by mouth 3 (three) times daily. To treat infection   BAYER CONTOUR NEXT TEST TEST STRIP    Test blood sugar three times daily DX E11.22   BLOOD GLUCOSE MONITORING  SUPPL (CONTOUR NEXT EZ MONITOR) W/DEVICE KIT    Use to monitor blood glucose as directed   DAPAGLIFLOZIN PROPANEDIOL (FARXIGA) 10 MG TABS TABLET    Take 10 mg by mouth daily.   PROMETHAZINE-DEXTROMETHORPHAN (PROMETHAZINE-DM) 6.25-15 MG/5ML SYRUP    Take 5 mLs by mouth every 6 (six) hours as needed for cough.    Review of Systems  Musculoskeletal: Positive for arthralgias.  Psychiatric/Behavioral: Positive for dysphoric mood and sleep disturbance. The patient is nervous/anxious.   All other systems reviewed and are negative.   Vitals:   11/16/16 1524  BP: 130/76  Pulse: 74  Temp: 98.1 F (36.7 C)  TempSrc: Oral  SpO2: 95%  Weight: 267 lb (121.1 kg)  Height: '5\' 6"'  (1.676 m)   Body mass index is 43.09 kg/m.  Physical Exam  Constitutional: He is oriented to person, place, and time. He appears well-developed and well-nourished.  HENT:  Mouth/Throat: Oropharynx is clear and moist.  MMM; no oral thrush  Eyes: Pupils are equal, round, and reactive to light. No scleral icterus.  OD prosthesis intact; OS PERRRL  Neck: Neck supple. Carotid bruit is not present. No thyromegaly present.  Cardiovascular: Normal rate, regular rhythm and intact distal pulses.  Exam reveals no gallop and no friction rub.   Murmur (1/6 SEM) heard. No distal LE edema. No calf TTP  Pulmonary/Chest: Effort normal and breath sounds normal. He has no wheezes. He has no rales. He exhibits no tenderness.  Abdominal: Soft. Normal appearance and bowel sounds are normal. He exhibits no distension, no abdominal bruit, no pulsatile midline mass and no mass. There is no hepatomegaly. There is no tenderness. There is no rigidity, no rebound and no  guarding. No hernia.  obese  Musculoskeletal: He exhibits edema.  Lymphadenopathy:    He has no cervical adenopathy.  Neurological: He is alert and oriented to person, place, and time.  Skin: Skin is warm and dry. No rash noted.  Psychiatric: He has a normal mood and affect. His behavior is normal. Judgment and thought content normal.     Labs reviewed: No visits with results within 3 Month(s) from this visit.  Latest known visit with results is:  Appointment on 03/24/2016  Component Date Value Ref Range Status  . Sodium 03/24/2016 136  135 - 146 mmol/L Final  . Potassium 03/24/2016 4.8  3.5 - 5.3 mmol/L Final  . Chloride 03/24/2016 102  98 - 110 mmol/L Final  . CO2 03/24/2016 22  20 - 31 mmol/L Final  . Glucose, Bld 03/24/2016 139* 65 - 99 mg/dL Final  . BUN 03/24/2016 13  7 - 25 mg/dL Final  . Creat 03/24/2016 0.90  0.70 - 1.33 mg/dL Final   Comment:   For patients > or = 59 years of age: The upper reference limit for Creatinine is approximately 13% higher for people identified as African-American.     . Total Bilirubin 03/24/2016 0.5  0.2 - 1.2 mg/dL Final  . Alkaline Phosphatase 03/24/2016 80  40 - 115 U/L Final  . AST 03/24/2016 45* 10 - 35 U/L Final  . ALT 03/24/2016 53* 9 - 46 U/L Final  . Total Protein 03/24/2016 6.9  6.1 - 8.1 g/dL Final  . Albumin 03/24/2016 3.8  3.6 - 5.1 g/dL Final  . Calcium 03/24/2016 8.8  8.6 - 10.3 mg/dL Final  . GFR, Est African American 03/24/2016 >89  >=60 mL/min Final  . GFR, Est Non African American 03/24/2016 >89  >=60 mL/min Final  .  Cholesterol 03/24/2016 94  <200 mg/dL Final  . Triglycerides 03/24/2016 83  <150 mg/dL Final  . HDL 03/24/2016 29* >40 mg/dL Final  . Total CHOL/HDL Ratio 03/24/2016 3.2  <5.0 Ratio Final  . VLDL 03/24/2016 17  <30 mg/dL Final  . LDL Cholesterol 03/24/2016 48  <100 mg/dL Final  . Hgb A1c MFr Bld 03/24/2016 7.6* <5.7 % Final   Comment:   For someone without known diabetes, a hemoglobin A1c value of 6.5%  or greater indicates that they may have diabetes and this should be confirmed with a follow-up test.   For someone with known diabetes, a value <7% indicates that their diabetes is well controlled and a value greater than or equal to 7% indicates suboptimal control. A1c targets should be individualized based on duration of diabetes, age, comorbid conditions, and other considerations.   Currently, no consensus exists for use of hemoglobin A1c for diagnosis of diabetes for children.     . Mean Plasma Glucose 03/24/2016 171  mg/dL Final    No results found.   Assessment/Plan   ICD-10-CM   1. Type 2 diabetes mellitus with diabetic neuropathy, with long-term current use of insulin (HCC) E11.40 metFORMIN (GLUCOPHAGE) 1000 MG tablet   Z79.4 CMP with eGFR    Hemoglobin A1c    Microalbumin/Creatinine Ratio, Urine  2. Hyperlipidemia LDL goal <70 E78.5 Lipid Panel    TSH  3. Essential hypertension, benign I10 metoprolol tartrate (LOPRESSOR) 25 MG tablet    CBC with Differential/Platelets  4. Depression with anxiety F41.8   5. Obstructive sleep apnea G47.33   6. Insomnia, unspecified type G47.00 zolpidem (AMBIEN) 10 MG tablet  7. Need for immunization against influenza Z23 Flu Vaccine QUAD 36+ mos IM   Flu shot given today  RETURN TO OFFICE FOR FASTING LABS next available appt  Continue current medications as ordered  Please setup appt to see Tivis Ringer for diabetic management  Follow up in 3 mos for DM, HTN, sleep apnea, hyperlipidemia   Dezmen Alcock S. Perlie Gold  [facility] [address] [place], [place] [address] [phone] Cell (Monday-Friday 8 AM - 5 PM) [phone] After 5 PM and follow prompts

## 2016-11-16 NOTE — Progress Notes (Signed)
Patient ID: Barry [name], male   DOB: 15-Jan-1956, 61 y.o.   MRN: [id number]   Location:  [facility] clinic  Provider: Dr. Eulas Post   Code Status:  Goals of Care:  Advanced Directives 03/26/2016  Does Patient Have a Medical Advance Directive? Yes  Type of Advance Directive Living will  Copy of [place] in Chart? -  Would patient like information on creating a medical advance directive? -  Pre-existing out of facility DNR order (yellow form or pink MOST form) -     Chief Complaint  Patient presents with  . Medical Management of Chronic Issues    7-8 month follow-up(patient was without insurance), DM foot exam and A1c DUE   . Medication Refill    Refill Ambien, Metformin, and Metoprolol  . Immunizations    Flu vaccine today   . Health Maintenance    Refused Hep C & HIV screening,   . Medication Management    Out of some medications x 1 month, not taking Farxiga due to cost, patient was [employer]      HPI: Patient is a 61 y.o. male seen today for medical management of chronic diseases. Patient states he has been having insurance problems and is now back on track. Complains of pain in right groin that radiates down to right knee. The pain start in 2014 after his cardiac cath. He states pain started get worse 2 months ago. Nothing seems to aggravate it and nothing seems to aleviate it. Rates pain 8/10.   HTN - stable on lisinopril and lopressor. he is on an ASA daily.  DM - BS checked fasting fluctuating. Fasting BS 180 today. Occasional low BS reactions  (weak/dizziness/shaking) relieved with eating and/or drinking. He takes toujeo 20 units daily and metformin. jardiance changed to [place] by diabetic educator. A1c 7.6%. He is followed by Tivis Ringer. Patient states he needs an appointment with Tivis Ringer.  Hyperlipidemia - taking lipitor. LDL 48; HDL 29.   Knee pain - stable on prn percocet. He has b/l knee pain. He has pain in his ankles at night when he tries  to cross them. He states "my whole body hurts"  CAD - stable. No CP. Occasional SOB. Takes BB, ACEI and plavix. He saw cardio in March and was told he was stable and to f/u in 1 yr.  Sleep apnea - he has not been wearing CPAP.  He gets a rash on face due to mask. He states his CPAP has broken down and needs to contact the supply company and get it replaced. He says he will follow up with Pulmonary.  Elevated liver enzymes - ALT 53; AST 45  GERD - stable on omeprazole.    Past Medical History:  Diagnosis Date  . Coronary artery disease   . MI (myocardial infarction) ([place]) 04/23/2007   inferior wall  . Morbid obesity ([place]) 06/26/2012  . S/P CABG x 3 07/04/2012   LIMA to LAD, SVG to D1, SVG to PDA, EVH via right thigh  . Sleep apnea   . Type II or unspecified type diabetes mellitus without mention of complication, not stated as uncontrolled   . Unspecified essential hypertension     Past Surgical History:  Procedure Laterality Date  . CORONARY ANGIOPLASTY WITH STENT PLACEMENT  04/23/2007   PCI and stenting of mid RCA - Dr Donnetta Hutching @ [facility]  . CORONARY ARTERY BYPASS GRAFT N/A 07/04/2012   Procedure: CORONARY ARTERY BYPASS GRAFTING (CABG);  Surgeon: Valentina Gu  Roxy Manns, MD;  Location: [place];  Service: Open Heart Surgery;  Laterality: N/A;  x3 using right greater saphenous vein and left internal mammary.   . INTRAOPERATIVE TRANSESOPHAGEAL ECHOCARDIOGRAM N/A 07/04/2012   Procedure: INTRAOPERATIVE TRANSESOPHAGEAL ECHOCARDIOGRAM;  Surgeon: Rexene Alberts, MD;  Location: [place];  Service: Open Heart Surgery;  Laterality: N/A;  . LEFT HEART CATHETERIZATION WITH CORONARY ANGIOGRAM N/A 06/25/2012   Procedure: LEFT HEART CATHETERIZATION WITH CORONARY ANGIOGRAM;  Surgeon: Lorretta Harp, MD;  Location: [facility] CATH LAB;  Service: Cardiovascular;  Laterality: N/A;    No Known Allergies  Outpatient Encounter Prescriptions as of 11/16/2016  Medication Sig  . albuterol (PROVENTIL HFA;VENTOLIN HFA) 108 (90  Base) MCG/ACT inhaler One puff three times daily for wheezing or shortness of breath  . aspirin 81 MG chewable tablet Chew 81 mg by mouth once.   Marland Kitchen atorvastatin (LIPITOR) 80 MG tablet TAKE 1 TABLET BY MOUTH DAILY  . Blood Glucose Monitoring Suppl (CONTOUR NEXT EZ MONITOR) w/Device KIT Test blood sugar three times daily E11.22  . busPIRone (BUSPAR) 15 MG tablet TAKE 1 TABLET BY MOUTH THREE TIMES DAILY FOR ANXIETY  . glucose blood (BAYER CONTOUR NEXT TEST) test strip Use as instructed  . Insulin Glargine (TOUJEO SOLOSTAR) 300 UNIT/ML SOPN Inject 20 Units into the skin daily at 6 (six) AM.  . Insulin Pen Needle 32G X 4 MM MISC Use as Directed. Dx: E11.40  . lisinopril (PRINIVIL,ZESTRIL) 5 MG tablet TAKE 1 TABLET BY MOUTH DAILY  . omeprazole (PRILOSEC) 40 MG capsule TAKE 1 CAPSULE BY MOUTH DAILY  . oxyCODONE-acetaminophen (ROXICET) 5-325 MG tablet Take 2 tablet in the morning and one tablet in the evening as needed for pain  . sertraline (ZOLOFT) 100 MG tablet TAKE 1 AND 1/2 TABLETS BY MOUTH AT BEDTIME  . [DISCONTINUED] BAYER CONTOUR NEXT TEST test strip Test blood sugar three times daily DX E11.22  . clopidogrel (PLAVIX) 75 MG tablet take 1 tablet by mouth once daily  . metFORMIN (GLUCOPHAGE) 1000 MG tablet take 1 tablet by mouth twice a day (Patient not taking: Reported on 11/16/2016)  . metoprolol tartrate (LOPRESSOR) 25 MG tablet take 1/2 tablet by mouth twice a day (Patient not taking: Reported on 11/16/2016)  . sildenafil (VIAGRA) 100 MG tablet take 1 tablet by mouth once daily as directed (Patient not taking: Reported on 11/16/2016)  . zolpidem (AMBIEN) 10 MG tablet take 1 tablet by mouth at bedtime (Patient not taking: Reported on 11/16/2016)  . [DISCONTINUED] ampicillin (PRINCIPEN) 500 MG capsule Take 1 capsule (500 mg total) by mouth 3 (three) times daily. To treat infection  . [DISCONTINUED] Blood Glucose Monitoring Suppl (CONTOUR NEXT EZ MONITOR) w/Device KIT Use to monitor blood glucose  as directed  . [DISCONTINUED] dapagliflozin propanediol (FARXIGA) 10 MG TABS tablet Take 10 mg by mouth daily.  . [DISCONTINUED] promethazine-dextromethorphan (PROMETHAZINE-DM) 6.25-15 MG/5ML syrup Take 5 mLs by mouth every 6 (six) hours as needed for cough.   No facility-administered encounter medications on file as of 11/16/2016.     Review of Systems:  Review of Systems  Constitutional: Negative for activity change, appetite change, chills, diaphoresis, fatigue and fever.  HENT: Positive for sinus pain (left side of face). Negative for congestion, dental problem, ear pain, facial swelling, hearing loss, postnasal drip, rhinorrhea, sinus pressure and sore throat.   Eyes: Negative for pain, discharge, itching and visual disturbance.  Respiratory: Negative for apnea, cough, chest tightness, shortness of breath and wheezing.   Cardiovascular: Negative for chest pain,  palpitations and leg swelling.  Gastrointestinal: Positive for abdominal pain (left side at site of hernia repair). Negative for abdominal distention, constipation, diarrhea, nausea and vomiting.  Genitourinary: Negative for difficulty urinating, dysuria, frequency, genital sores, hematuria and scrotal swelling.  Musculoskeletal: Positive for arthralgias (right leg pain). Negative for back pain, neck pain and neck stiffness.  Skin: Negative for color change and pallor.  Neurological: Negative for dizziness, facial asymmetry, weakness, light-headedness, numbness and headaches.  Hematological: Negative for adenopathy.  Psychiatric/Behavioral: Negative for agitation, behavioral problems, sleep disturbance and suicidal ideas.    Health Maintenance  Topic Date Due  . INFLUENZA VACCINE  08/25/2016  . HEMOGLOBIN A1C  09/21/2016  . FOOT EXAM  09/23/2016  . HIV Screening  01/25/2017 (Originally 04/27/1972)  . Hepatitis C Screening  01/26/2023 (Originally Feb 13, 1955)  . PNEUMOCOCCAL POLYSACCHARIDE VACCINE (2) 09/05/2017  . OPHTHALMOLOGY  EXAM  11/13/2017  . COLONOSCOPY  11/22/2023  . TETANUS/TDAP  05/07/2024    Physical Exam: Vitals:   11/16/16 1524  BP: 130/76  Pulse: 74  Temp: 98.1 F (36.7 C)  TempSrc: Oral  SpO2: 95%  Weight: 267 lb (121.1 kg)  Height: '5\' 6"'  (1.676 m)   Body mass index is 43.09 kg/m. Physical Exam  Constitutional: He is oriented to person, place, and time. He appears well-developed and well-nourished. No distress.  HENT:  Head: Normocephalic and atraumatic.  Nose: Nose normal.  Mouth/Throat: Oropharynx is clear and moist. No oropharyngeal exudate.  Eyes: Conjunctivae are normal. Right eye exhibits no discharge. Left conjunctiva is not injected. Left eye exhibits normal extraocular motion and no nystagmus. Left pupil is round and reactive.  Right eye is a prosthetic   Neck: Normal range of motion. Neck supple. No JVD present. No tracheal deviation present.  Cardiovascular: Normal rate, regular rhythm, normal heart sounds and intact distal pulses.   Pulmonary/Chest: Effort normal and breath sounds normal. No respiratory distress. He has no wheezes.  Abdominal: Soft. Bowel sounds are normal. He exhibits no distension and no mass. There is tenderness. There is no rebound.    Musculoskeletal: Normal range of motion. He exhibits no edema, tenderness or deformity.  Neurological: He is alert and oriented to person, place, and time. A sensory deficit (Reduced sensory sensation to monofillatment right dorsal ankle to mid tiba of lower extremity.  ) is present.  Skin: Skin is warm and dry. He is not diaphoretic. No erythema.  Psychiatric: He has a normal mood and affect. His behavior is normal.    Labs reviewed: Basic Metabolic Panel:  Recent Labs  12/04/15 0833 12/23/15 0804 03/08/16 0941 03/24/16 0826  NA 134* 135 138 136  K 5.0 4.7 4.6 4.8  CL 99 99 104 102  CO2 '26 25 22 22  ' GLUCOSE 186* 190* 160* 139*  BUN '19 22 14 13  ' CREATININE 0.82 1.15 0.92 0.90  CALCIUM 9.4 9.5 9.5 8.8  TSH  1.65  --   --   --    Liver Function Tests:  Recent Labs  12/23/15 0804 03/08/16 0941 03/24/16 0826  AST 52* 64* 45*  ALT 64* 70* 53*  ALKPHOS 97 87 80  BILITOT 0.5 0.5 0.5  PROT 7.3 7.8 6.9  ALBUMIN 4.2 4.3 3.8   No results for input(s): LIPASE, AMYLASE in the last 8760 hours. No results for input(s): AMMONIA in the last 8760 hours. CBC:  Recent Labs  12/04/15 0833  WBC 5.9  NEUTROABS 4,071  HGB 13.7  HCT 42.2  MCV 90.9  PLT 136*  Lipid Panel:  Recent Labs  12/04/15 0833 03/24/16 0826  CHOL 130 94  HDL 42 29*  LDLCALC 71 48  TRIG 86 83  CHOLHDL 3.1 3.2   Lab Results  Component Value Date   HGBA1C 7.6 (H) 03/24/2016    Procedures since last visit: No results found.  Assessment/Plan There are no diagnoses linked to this encounter.   Labs/tests ordered:   Next appt:  Visit date not found

## 2016-11-22 ENCOUNTER — Ambulatory Visit (INDEPENDENT_AMBULATORY_CARE_PROVIDER_SITE_OTHER): Payer: BLUE CROSS/BLUE SHIELD | Admitting: Pharmacotherapy

## 2016-11-22 ENCOUNTER — Encounter: Payer: Self-pay | Admitting: Pharmacotherapy

## 2016-11-22 ENCOUNTER — Other Ambulatory Visit: Payer: Self-pay

## 2016-11-22 VITALS — BP 118/78 | HR 72 | Ht 66.0 in | Wt 261.0 lb

## 2016-11-22 DIAGNOSIS — Z794 Long term (current) use of insulin: Secondary | ICD-10-CM

## 2016-11-22 DIAGNOSIS — E1140 Type 2 diabetes mellitus with diabetic neuropathy, unspecified: Secondary | ICD-10-CM | POA: Diagnosis not present

## 2016-11-22 DIAGNOSIS — I10 Essential (primary) hypertension: Secondary | ICD-10-CM | POA: Diagnosis not present

## 2016-11-22 DIAGNOSIS — E785 Hyperlipidemia, unspecified: Secondary | ICD-10-CM

## 2016-11-22 NOTE — Progress Notes (Signed)
  Subjective:    Barry [name] is a 61 y.o. white male who presents for follow-up of Type 2 diabetes mellitus.   Last A1C: 7.6%.  NO recent labs. Has not been SMBG "I'm trying to get back on track".   Reports he has been under a lot of stress.  Was without insurance for a while.  Missed several doses of his medication. He says he has restarted his Toujeo 20 units daily. Was off metformin for several months.  Says he just restarted it.  Poor eating habits. No routine exercise. Vision is blurry.  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 Has a broken tooth.  Now has dental insurance. Denies problems with feet. Some peripheral edema. Nocturia 4 times per night at least. No dysuria. Not drinking much water.   Review of Systems A comprehensive review of systems was negative except for: Eyes: positive for contacts/glasses and visual disturbance Cardiovascular: positive for lower extremity edema Genitourinary: positive for nocturia Musculoskeletal: positive for muscle weakness    Objective:    BP 118/78   Pulse 72   Ht 5\' 6"  (1.676 m)   Wt 261 lb (118.4 kg)   SpO2 95%   BMI 42.13 kg/m   General:  alert, cooperative and no distress  Oropharynx: abnormal findings: none   Eyes:  negative findings: lids and lashes normal and conjunctivae and sclerae normal   Ears:  external ears normal        Lung: clear to auscultation bilaterally  Heart:  regular rate and rhythm     Extremities: edema bilateral lower extremities  Skin: dry     Neuro: mental status, speech normal, alert and oriented x3 and gait and station normal   Lab Review Glucose, Bld (mg/dL)  Date Value  62/26/3335 139 (H)  03/08/2016 160 (H)  12/23/2015 190 (H)   CO2 (mmol/L)  Date Value  03/24/2016 22  03/08/2016 22  12/23/2015 25   BUN (mg/dL)  Date Value  45/62/5638 13  03/08/2016 14  12/23/2015 22  06/18/2015 17  05/30/2015 20  01/13/2015 14   Creat (mg/dL)  Date Value  93/73/4287 0.90  03/08/2016 0.92   12/23/2015 1.15       Assessment:    Diabetes Mellitus type II, under poor control.  Due to not taking his medication BP at goal <130/80   Plan:    1.  Rx changes: restart Toujeo 20 units daily and Metformin 1000mg  BID.  2.  Counseled at length on importance of taking medication as prescribed. 3.  Counseled on complications of uncontrolled diabetes. 4.  Counseled on nutrition goals. 5.  Counseled on need for routine exercise.  Goal is 30-45 minutes 5 x week.

## 2016-11-23 LAB — CBC WITH DIFFERENTIAL/PLATELET
BASOS PCT: 0.9 %
Basophils Absolute: 58 cells/uL (ref 0–200)
EOS ABS: 173 {cells}/uL (ref 15–500)
Eosinophils Relative: 2.7 %
HEMATOCRIT: 43.1 % (ref 38.5–50.0)
HEMOGLOBIN: 13.9 g/dL (ref 13.2–17.1)
LYMPHS ABS: 1306 {cells}/uL (ref 850–3900)
MCH: 29.3 pg (ref 27.0–33.0)
MCHC: 32.3 g/dL (ref 32.0–36.0)
MCV: 90.7 fL (ref 80.0–100.0)
MPV: 12.2 fL (ref 7.5–12.5)
Monocytes Relative: 9.7 %
NEUTROS ABS: 4243 {cells}/uL (ref 1500–7800)
Neutrophils Relative %: 66.3 %
Platelets: 129 10*3/uL — ABNORMAL LOW (ref 140–400)
RBC: 4.75 10*6/uL (ref 4.20–5.80)
RDW: 13.1 % (ref 11.0–15.0)
Total Lymphocyte: 20.4 %
WBC: 6.4 10*3/uL (ref 3.8–10.8)
WBCMIX: 621 {cells}/uL (ref 200–950)

## 2016-11-23 LAB — HEMOGLOBIN A1C
EAG (MMOL/L): 13.8 (calc)
HEMOGLOBIN A1C: 10.3 %{Hb} — AB (ref <5.7)
MEAN PLASMA GLUCOSE: 249 (calc)

## 2016-11-23 LAB — COMPLETE METABOLIC PANEL WITH GFR
AG RATIO: 1.1 (calc) (ref 1.0–2.5)
ALBUMIN MSPROF: 4.1 g/dL (ref 3.6–5.1)
ALKALINE PHOSPHATASE (APISO): 97 U/L (ref 40–115)
ALT: 61 U/L — ABNORMAL HIGH (ref 9–46)
AST: 56 U/L — AB (ref 10–35)
BILIRUBIN TOTAL: 0.6 mg/dL (ref 0.2–1.2)
BUN: 16 mg/dL (ref 7–25)
CHLORIDE: 100 mmol/L (ref 98–110)
CO2: 27 mmol/L (ref 20–32)
CREATININE: 0.96 mg/dL (ref 0.70–1.33)
Calcium: 9.2 mg/dL (ref 8.6–10.3)
GFR, Est African American: 100 mL/min/{1.73_m2} (ref >=60)
GFR, Est Non African American: 86 mL/min/{1.73_m2} (ref >=60)
GLOBULIN: 3.6 g/dL (ref 1.9–3.7)
Glucose, Bld: 291 mg/dL — ABNORMAL HIGH (ref 65–99)
POTASSIUM: 4.7 mmol/L (ref 3.5–5.3)
SODIUM: 136 mmol/L (ref 135–146)
Total Protein: 7.7 g/dL (ref 6.1–8.1)

## 2016-11-23 LAB — LIPID PANEL
CHOL/HDL RATIO: 3.3 (calc) (ref <5.0)
CHOLESTEROL: 125 mg/dL (ref <200)
HDL: 38 mg/dL — ABNORMAL LOW (ref >40)
LDL Cholesterol (Calc): 69 mg/dL (calc)
Non-HDL Cholesterol (Calc): 87 mg/dL (calc) (ref <130)
Triglycerides: 101 mg/dL (ref <150)

## 2016-11-23 LAB — TSH: TSH: 1.48 mIU/L (ref 0.40–4.50)

## 2016-11-23 LAB — MICROALBUMIN / CREATININE URINE RATIO
Creatinine, Urine: 226 mg/dL (ref 20–320)
MICROALB/CREAT RATIO: 165 ug/mg{creat} — AB (ref <30)
Microalb, Ur: 37.2 mg/dL

## 2016-11-24 ENCOUNTER — Other Ambulatory Visit: Payer: Self-pay

## 2016-11-24 ENCOUNTER — Encounter: Payer: Self-pay | Admitting: *Deleted

## 2016-11-25 ENCOUNTER — Telehealth: Payer: Self-pay

## 2016-11-25 NOTE — Telephone Encounter (Signed)
Patient called and requested antibiotic for tooth abscess. Patient states he mention this at last appointment. Patient is in process of setting up an appointment with an oral surgeon, as of now no appointment scheduled.   Patient is newly with insurance and is trying to get reacquainted with all his providers   Please advise, pharmacy on file confirmed

## 2016-11-25 NOTE — Telephone Encounter (Signed)
Spoke with patient, patient will schedule appointment tomorrow when he comes to pick-up rx. Patient did not wish to schedule at the time of call

## 2016-11-25 NOTE — Telephone Encounter (Signed)
No documentation for his c/o of tooth pain at last OV. No abnormalities seen on last oral exam. He needs to be seen to determine if abx needed

## 2016-11-26 ENCOUNTER — Other Ambulatory Visit: Payer: Self-pay | Admitting: *Deleted

## 2016-11-26 DIAGNOSIS — M5417 Radiculopathy, lumbosacral region: Secondary | ICD-10-CM

## 2016-11-26 MED ORDER — OXYCODONE-ACETAMINOPHEN 5-325 MG PO TABS: ORAL_TABLET | ORAL | 0 refills | Status: DC

## 2016-11-26 NOTE — Addendum Note (Signed)
Addended by: Sueanne Margarita on: 11/26/2016 09:18 AM   Modules accepted: Orders

## 2016-12-27 ENCOUNTER — Other Ambulatory Visit: Payer: Self-pay | Admitting: *Deleted

## 2016-12-27 DIAGNOSIS — M5417 Radiculopathy, lumbosacral region: Secondary | ICD-10-CM

## 2016-12-27 MED ORDER — OXYCODONE-ACETAMINOPHEN 5-325 MG PO TABS: ORAL_TABLET | ORAL | 0 refills | Status: DC

## 2016-12-27 NOTE — Telephone Encounter (Signed)
Patient requested and will pick up NCCSRS Database Verified.  

## 2016-12-28 ENCOUNTER — Other Ambulatory Visit: Payer: Self-pay | Admitting: Internal Medicine

## 2016-12-28 DIAGNOSIS — F418 Other specified anxiety disorders: Secondary | ICD-10-CM

## 2017-01-27 ENCOUNTER — Other Ambulatory Visit: Payer: Self-pay | Admitting: *Deleted

## 2017-01-27 DIAGNOSIS — M5417 Radiculopathy, lumbosacral region: Secondary | ICD-10-CM

## 2017-01-27 MED ORDER — OXYCODONE-ACETAMINOPHEN 5-325 MG PO TABS: ORAL_TABLET | ORAL | 0 refills | Status: DC

## 2017-01-27 NOTE — Telephone Encounter (Signed)
Patient called and requested refill NCCSRS Database Verified Pharmacy Confirmed Pended Rx and sent to Dr. Montez Morita for approval.

## 2017-02-16 ENCOUNTER — Encounter: Payer: Self-pay | Admitting: Internal Medicine

## 2017-02-16 ENCOUNTER — Ambulatory Visit (INDEPENDENT_AMBULATORY_CARE_PROVIDER_SITE_OTHER): Payer: BLUE CROSS/BLUE SHIELD | Admitting: Internal Medicine

## 2017-02-16 VITALS — BP 124/82 | HR 66 | Temp 98.6°F | Ht 66.0 in | Wt 257.0 lb

## 2017-02-16 DIAGNOSIS — Z1211 Encounter for screening for malignant neoplasm of colon: Secondary | ICD-10-CM | POA: Diagnosis not present

## 2017-02-16 DIAGNOSIS — G4700 Insomnia, unspecified: Secondary | ICD-10-CM | POA: Diagnosis not present

## 2017-02-16 DIAGNOSIS — F418 Other specified anxiety disorders: Secondary | ICD-10-CM | POA: Diagnosis not present

## 2017-02-16 DIAGNOSIS — D696 Thrombocytopenia, unspecified: Secondary | ICD-10-CM

## 2017-02-16 DIAGNOSIS — M2550 Pain in unspecified joint: Secondary | ICD-10-CM | POA: Diagnosis not present

## 2017-02-16 DIAGNOSIS — Z951 Presence of aortocoronary bypass graft: Secondary | ICD-10-CM

## 2017-02-16 DIAGNOSIS — R809 Proteinuria, unspecified: Secondary | ICD-10-CM

## 2017-02-16 DIAGNOSIS — R350 Frequency of micturition: Secondary | ICD-10-CM | POA: Diagnosis not present

## 2017-02-16 DIAGNOSIS — E785 Hyperlipidemia, unspecified: Secondary | ICD-10-CM

## 2017-02-16 DIAGNOSIS — I2510 Atherosclerotic heart disease of native coronary artery without angina pectoris: Secondary | ICD-10-CM

## 2017-02-16 DIAGNOSIS — G8929 Other chronic pain: Secondary | ICD-10-CM

## 2017-02-16 DIAGNOSIS — E1129 Type 2 diabetes mellitus with other diabetic kidney complication: Secondary | ICD-10-CM | POA: Diagnosis not present

## 2017-02-16 DIAGNOSIS — M25511 Pain in right shoulder: Secondary | ICD-10-CM

## 2017-02-16 DIAGNOSIS — R202 Paresthesia of skin: Secondary | ICD-10-CM | POA: Diagnosis not present

## 2017-02-16 MED ORDER — SERTRALINE HCL 100 MG PO TABS: ORAL_TABLET | ORAL | 3 refills | Status: DC

## 2017-02-16 MED ORDER — MELOXICAM 7.5 MG PO TABS: 7.5000 mg | ORAL_TABLET | Freq: Every day | ORAL | 6 refills | Status: DC

## 2017-02-16 MED ORDER — ZOLPIDEM TARTRATE 10 MG PO TABS: 10.0000 mg | ORAL_TABLET | Freq: Every day | ORAL | 0 refills | Status: DC

## 2017-02-16 NOTE — Patient Instructions (Signed)
START MELOXICAM 7.5 MG TAKE 1 TABLET DAILY WITH FOOD FOR JOINT PAIN  Continue other medications as ordered  Will call with lab results  Will call with imaging/nerve conduction appt/results  Follow up with Edison Pace as scheduled  Will call with referral appts  Follow up in 3 mos for joint pain, DM, HTN, hyperlipidemia

## 2017-02-16 NOTE — Progress Notes (Signed)
Patient ID: Barry [name], male   DOB: 12-01-1955, 62 y.o.   MRN: [id number]   Location:  [facility] OFFICE  Provider: DR Arletha Grippe   Goals of Care:  Advanced Directives 02/16/2017  Does Patient Have a Medical Advance Directive? No  Type of Advance Directive -  Copy of [place] in Chart? -  Would patient like information on creating a medical advance directive? Yes (MAU/Ambulatory/Procedural Areas - Information given)  Pre-existing out of facility DNR order (yellow form or pink MOST form) -     Chief Complaint  Patient presents with  . Medical Management of Chronic Issues    3 month follow-up on DM, HTN, sleep apnea, and high cholesterol   . Medication Refill    Refill Ambien   . Leg Pain    Right leg pain, mainly at night   . Advanced Directive    Discuss HPOA/Living Will     HPI: Patient is a 62 y.o. male seen today for medical management of chronic diseases.  He needs RF on zolpidem. No other concerns.   Depression with anxiety/insomnia - mood stable on sertraline 153m qhs; buspar TID. Sleep stable on insomnia  HTN - BP controlled on lisinopril and lopressor. He takes ASA daily  DM - uncontrolled. BS checked fasting fluctuating. Occasional low BS reactions  (weak/dizziness/shaking) relieved with eating and/or drinking. He takes toujeo 20 units daily and metformin. No longer on farxiga 2/2 cost. A1c 10.3%. Urine microalbumin/Cr ratio 165. He is followed by CTivis Ringer LDL at goal on statin. He takes ACEI  Hyperlipidemia - stable on lipitor. LDL 69; HDL 38  Chronic pain/Knee pain - uncontrolled on percocet 4 tabs daily. He has b/l knee pain. He has pain in his ankles at night when he tries to cross them. He states "my whole body hurts"; right shoulder and hand very painful and "feels like it will break".  CAD - s/p CABG x 3 vessels. stable.  No CP. Occasional SOB. Takes BB, ACEI and plavix. He has not seen Cardio Dr BGwenlyn Foundfor f/u since 2016  Sleep apnea -  uncontrolled. he has not been wearing CPAP as mask broke.  He gets a rash on face due to mask. He has excessive daytime sleepiness.   Morbid obesity - actively dieting. Weight down 10 lbs since Oct 2018. BMI 41.48  Elevated liver enzymes - worse. ALT 61 (prev 53); AST 56 (prev 45)  GERD - sx's controlled on omeprazole.   Low platelets - etiology unknown. Plts 129K<--136K    Past Medical History:  Diagnosis Date  . Coronary artery disease   . [place]   inferior wall  . Morbid obesity ([facility] 06/26/2012  . S/P CABG x 3 07/04/2012   LIMA to LAD, SVG to D1, SVG to PDA, EVH via right thigh  . Sleep apnea   . Type II or unspecified type diabetes mellitus without mention of complication, not stated as uncontrolled   . Unspecified essential hypertension     Past Surgical History:  Procedure Laterality Date  . CORONARY ANGIOPLASTY WITH STENT PLACEMENT  04/23/2007   PCI and stent[facility] mid RCA [email or web address] . CORONARY ARTERY BYPASS GRAFT N/A 07/04/2012   Procedure: CORONARY ARTERY BYPASS GRAFTING (CABG);  Surgeon: CRexene Alberts MD;  [facility] [facility]  Service: Open Heart Surgery;  Laterality: N/A;  x3 using right greater saphenous vein and left internal mammary.   . INTRAOPERATIVE TRANSESOPHAGEAL  ECHOCARDIOGRAM N/A 07/04/2012   Procedure: INTRAOPERATIVE TRANSESOPHAGEAL ECHOCARDIOGRAM;  Surgeon: Rexene Alberts, MD;  Location: [place];  Service: Open Heart Surgery;  Laterality: N/A;  . LEFT HEART CATHETERIZATION WITH CORONARY ANGIOGRAM N/A 06/25/2012   Procedure: LEFT HEART CATHETERIZATION WITH CORONARY ANGIOGRAM;  Surgeon: Lorretta Harp, MD;  Location: [facility] CATH LAB;  Service: Cardiovascular;  Laterality: N/A;     reports that  has never smoked. he has never used smokeless tobacco. He reports that he does not drink alcohol or use drugs. Social History   Socioeconomic History  . Marital status: Divorced    Spouse name: Not on file  . Number of  children: 1  . Years of education: Not on file  . Highest education level: Not on file  Social Needs  . Financial resource strain: Not on file  . Food insecurity - worry: Not on file  . Food insecurity - inability: Not on file  . Transportation needs - medical: Not on file  . Transportation needs - non-medical: Not on file  Occupational History  . Occupation: MAINTENANCE    Employer: [employer]  Tobacco Use  . Smoking status: Never Smoker  . Smokeless tobacco: Never Used  Substance and Sexual Activity  . Alcohol use: No    Alcohol/week: 0.0 oz  . Drug use: No  . Sexual activity: Not on file  Other Topics Concern  . Not on file  Social History Narrative  . Not on file    Family History  Problem Relation Age of Onset  . Cancer Mother   . Diabetes Sister   . Diabetes Brother     No Known Allergies  Outpatient Encounter Medications as of 02/16/2017  Medication Sig  . albuterol (PROVENTIL HFA;VENTOLIN HFA) 108 (90 Base) MCG/ACT inhaler One puff three times daily for wheezing or shortness of breath  . aspirin 81 MG chewable tablet Chew 81 mg by mouth once.   Marland Kitchen atorvastatin (LIPITOR) 80 MG tablet TAKE 1 TABLET BY MOUTH DAILY  . Blood Glucose Monitoring Suppl (CONTOUR NEXT EZ MONITOR) w/Device KIT Test blood sugar three times daily E11.22  . busPIRone (BUSPAR) 15 MG tablet TAKE 1 TABLET BY MOUTH THREE TIMES DAILY FOR ANXIETY  . clopidogrel (PLAVIX) 75 MG tablet take 1 tablet by mouth once daily  . glucose blood (BAYER CONTOUR NEXT TEST) test strip Use as instructed  . Insulin Glargine (TOUJEO SOLOSTAR) 300 UNIT/ML SOPN Inject 20 Units into the skin daily at 6 (six) AM.  . Insulin Pen Needle 32G X 4 MM MISC Use as Directed. Dx: E11.40  . lisinopril (PRINIVIL,ZESTRIL) 5 MG tablet TAKE 1 TABLET BY MOUTH DAILY  . metFORMIN (GLUCOPHAGE) 1000 MG tablet Take 1 tablet (1,000 mg total) by mouth 2 (two) times daily.  . metoprolol tartrate (LOPRESSOR) 25 MG tablet Take 0.5 tablets  (12.5 mg total) by mouth 2 (two) times daily.  Marland Kitchen omeprazole (PRILOSEC) 40 MG capsule TAKE 1 CAPSULE BY MOUTH DAILY  . oxyCODONE-acetaminophen (ROXICET) 5-325 MG tablet Take 2 tablet in the morning and one tablet in the evening as needed for pain  . sertraline (ZOLOFT) 100 MG tablet TAKE 1 AND 1/2 TABLETS BY MOUTH AT BEDTIME  . sildenafil (VIAGRA) 100 MG tablet take 1 tablet by mouth once daily as directed  . zolpidem (AMBIEN) 10 MG tablet Take 1 tablet (10 mg total) by mouth at bedtime.   No facility-administered encounter medications on file as of 02/16/2017.     Review of Systems:  Review  of Systems  Constitutional: Positive for fatigue.  Respiratory: Positive for shortness of breath.   Genitourinary: Positive for frequency (with nocturia).  Neurological: Positive for numbness (and tingling in hands).  All other systems reviewed and are negative.   Health Maintenance  Topic Date Due  . HIV Screening  02/16/2018 (Originally 04/27/1970)  . Hepatitis C Screening  01/26/2023 (Originally 11-15-1955)  . HEMOGLOBIN A1C  05/23/2017  . PNEUMOCOCCAL POLYSACCHARIDE VACCINE (2) 09/05/2017  . OPHTHALMOLOGY EXAM  11/13/2017  . FOOT EXAM  11/16/2017  . COLONOSCOPY  11/22/2023  . TETANUS/TDAP  05/07/2024  . INFLUENZA VACCINE  Completed    Physical Exam: Vitals:   02/16/17 0802  BP: 124/82  Pulse: 66  Temp: 98.6 F (37 C)  TempSrc: Oral  SpO2: 95%  Weight: 257 lb (116.6 kg)  Height: '5\' 6"'  (1.676 m)   Body mass index is 41.48 kg/m. Physical Exam  Constitutional: He is oriented to person, place, and time. He appears well-developed and well-nourished.  HENT:  Mouth/Throat: Oropharynx is clear and moist.  MMM; no oral thrush  Eyes: Pupils are equal, round, and reactive to light. No scleral icterus.  Neck: Neck supple. Carotid bruit is not present. No thyromegaly present.  Cardiovascular: Normal rate, regular rhythm and intact distal pulses. Exam reveals no gallop and no friction rub.    Murmur (1/6 SEM) heard. No distal LE edema. No calf TTP  Pulmonary/Chest: Effort normal and breath sounds normal. He has no wheezes. He has no rales. He exhibits no tenderness.  Abdominal: Soft. Normal appearance and bowel sounds are normal. He exhibits no distension, no abdominal bruit, no pulsatile midline mass and no mass. There is no hepatomegaly. There is no tenderness. There is no rigidity, no rebound and no guarding. No hernia.  obese  Musculoskeletal: He exhibits edema (right wrist, thumb, shoulder with reduced ROM and TTP) and tenderness.  R>L (+) Tinel's test; (+) Apley scratch test on right; strength mildy reduced in RUE;   Lymphadenopathy:    He has no cervical adenopathy.  Neurological: He is alert and oriented to person, place, and time. He has normal reflexes.  Skin: Skin is warm and dry. No rash noted.  Psychiatric: He has a normal mood and affect. His behavior is normal. Judgment and thought content normal.    Labs reviewed: Basic Metabolic Panel: Recent Labs    03/08/16 0941 03/24/16 0826 11/22/16 0927  NA 138 136 136  K 4.6 4.8 4.7  CL 104 102 100  CO2 '22 22 27  ' GLUCOSE 160* 139* 291*  BUN '14 13 16  ' CREATININE 0.92 0.90 0.96  CALCIUM 9.5 8.8 9.2  TSH  --   --  1.48   Liver Function Tests: Recent Labs    03/08/16 0941 03/24/16 0826 11/22/16 0927  AST 64* 45* 56*  ALT 70* 53* 61*  ALKPHOS 87 80  --   BILITOT 0.5 0.5 0.6  PROT 7.8 6.9 7.7  ALBUMIN 4.3 3.8  --    No results for input(s): LIPASE, AMYLASE in the last 8760 hours. No results for input(s): AMMONIA in the last 8760 hours. CBC: Recent Labs    11/22/16 0927  WBC 6.4  NEUTROABS 4,243  HGB 13.9  HCT 43.1  MCV 90.7  PLT 129*   Lipid Panel: Recent Labs    03/24/16 0826 11/22/16 0927  CHOL 94 125  HDL 29* 38*  LDLCALC 48  --   TRIG 83 101  CHOLHDL 3.2 3.3   Lab Results  Component Value Date   HGBA1C 10.3 (H) 11/22/2016    Procedures since last visit: No results  found.  Assessment/Plan   ICD-10-CM   1. Pain in joint involving multiple sites M25.50 meloxicam (MOBIC) 7.5 MG tablet  2. Positive Tinel's sign R20.2 NCV with EMG(electromyography)   R>L  3. Chronic right shoulder pain M25.511 DG Shoulder Right   G89.29    with (+) Apley scratch test  4. Insomnia, unspecified type G47.00 zolpidem (AMBIEN) 10 MG tablet  5. Depression with anxiety F41.8 sertraline (ZOLOFT) 100 MG tablet  6. Increased urinary frequency R35.0 Urinalysis with Reflex Microscopic  7. Thrombocytopenia (HCC) D69.6 CBC with Differential/Platelets  8. Colon cancer screening Z12.11 Ambulatory referral to Gastroenterology  9. Coronary artery disease involving native coronary artery of native heart without angina pectoris I25.10 Ambulatory referral to Cardiology  10. Microalbuminuria due to type 2 diabetes mellitus (HCC) E11.29 Microalbumin/Creatinine Ratio, Urine   R80.9   11. S/P CABG x 3 Z95.1 Ambulatory referral to Cardiology  12. Hyperlipidemia LDL goal <70 E78.5 Lipid Panel    TSH   START MELOXICAM 7.5 MG TAKE 1 TABLET DAILY WITH FOOD FOR JOINT PAIN  Continue other medications as ordered  T/c MRI right shoulder +/- PT. May need Ortho referral  Will call with lab results  Will call with imaging/nerve conduction appt/results  Follow up with Tivis Ringer as scheduled  Will call with referral appts  Follow up in 3 mos for joint pain, DM, HTN, hyperlipidemia   Emmanuell Kantz S. Perlie Gold  [facility] [address] [place], [place] [address] [phone] Cell (Monday-Friday 8 AM - 5 PM) [phone] After 5 PM and follow prompts

## 2017-02-17 ENCOUNTER — Encounter: Payer: Self-pay | Admitting: Neurology

## 2017-02-17 ENCOUNTER — Other Ambulatory Visit: Payer: Self-pay

## 2017-02-17 ENCOUNTER — Other Ambulatory Visit: Payer: BLUE CROSS/BLUE SHIELD

## 2017-02-17 DIAGNOSIS — D696 Thrombocytopenia, unspecified: Secondary | ICD-10-CM

## 2017-02-18 ENCOUNTER — Other Ambulatory Visit: Payer: Self-pay | Admitting: *Deleted

## 2017-02-18 DIAGNOSIS — R202 Paresthesia of skin: Secondary | ICD-10-CM

## 2017-02-21 ENCOUNTER — Encounter: Payer: Self-pay | Admitting: Pharmacotherapy

## 2017-02-21 ENCOUNTER — Ambulatory Visit: Payer: BLUE CROSS/BLUE SHIELD | Admitting: Pharmacotherapy

## 2017-02-21 ENCOUNTER — Telehealth: Payer: Self-pay

## 2017-02-21 VITALS — BP 128/80 | HR 61 | Temp 98.6°F | Ht 66.0 in | Wt 259.0 lb

## 2017-02-21 DIAGNOSIS — I10 Essential (primary) hypertension: Secondary | ICD-10-CM | POA: Diagnosis not present

## 2017-02-21 DIAGNOSIS — E1140 Type 2 diabetes mellitus with diabetic neuropathy, unspecified: Secondary | ICD-10-CM | POA: Diagnosis not present

## 2017-02-21 DIAGNOSIS — Z794 Long term (current) use of insulin: Secondary | ICD-10-CM

## 2017-02-21 MED ORDER — INSULIN GLARGINE 300 UNIT/ML ~~LOC~~ SOPN: 30.0000 [IU] | PEN_INJECTOR | Freq: Every day | SUBCUTANEOUS | 5 refills | Status: DC

## 2017-02-21 NOTE — Telephone Encounter (Signed)
Patient was in office to see Ronal Fear today and mentioned that he is due for Oxycodone refill on Friday.  Patient also asked if the instructions were changed for he mentioned at his last OV with Dr.Carter that he is taking 2 in the morning and 2 in the evening (medication list not updated).  Please advise if rx to be changed and dispensed number to be increased for refill due on Friday 02/25/17   Patient started Meloxicam 7.5 mg on Saturday (FYI)  Last filled 01/27/17

## 2017-02-21 NOTE — Progress Notes (Signed)
  Subjective:    Barry [name] is a 62 y.o.white male who presents for follow-up of Type 2 diabetes mellitus.  Last A1C: 10.3% (11/23/16) Currently on metformin and Toujeo 20 units daily  Did not bring blood glucose meter. He has not been checking his blood glucose.  He says his last known BG was 127m/dl. He reports that he is trying to eat healthier.  Has cut out sodas. No routine exercise.  Says he gets enough walking during his work.  Some blurry vision.  Only has vision in one eye. His eyes are photosensitive. Has peripheral edema Denies problems with feet. He has nocturia "all night" He is avoiding drinking water.  He thinks it makes him "swell up" Complains of joint and leg pain.  Right side worse than left.     Review of Systems A comprehensive review of systems was negative except for: Constitutional: positive for fatigue Eyes: positive for contacts/glasses Cardiovascular: positive for lower extremity edema Genitourinary: positive for nocturia Musculoskeletal: positive for arthralgias, muscle weakness and stiff joints Endocrine: positive for diabetic symptoms including blurry vision, increased fatigue, polyuria and skin dryness    Objective:    BP 128/80   Pulse 61   Temp 98.6 F (37 C) (Oral)   Ht 5' 6"$  (1.676 m)   Wt 259 lb (117.5 kg)   SpO2 96%   BMI 41.80 kg/m   General:  alert, cooperative and fatigued  Oropharynx: normal findings: lips normal without lesions and gums healthy   Eyes:  negative findings: lids and lashes normal and conjunctivae and sclerae normal   Ears:  external ears normal        Lung: clear to auscultation bilaterally  Heart:  regular rate and rhythm     Extremities: edema bilateral hands and feet  Skin: dry     Neuro: mental status, speech normal, alert and oriented x3 and gait and station normal   Lab Review Glucose, Bld (mg/dL)  Date Value  11/22/2016 291 (H)  03/24/2016 139 (H)  03/08/2016 160 (H)   CO2 (mmol/L)  Date  Value  11/22/2016 27  03/24/2016 22  03/08/2016 22   BUN (mg/dL)  Date Value  11/22/2016 16  03/24/2016 13  03/08/2016 14  06/18/2015 17  05/30/2015 20  01/13/2015 14   Creat (mg/dL)  Date Value  11/22/2016 0.96  03/24/2016 0.90  03/08/2016 0.92   add A1C to labs    Assessment:    Diabetes Mellitus type II, under poor control.   BP at goal <130/80   Plan:    1.  Rx changes: Increase Toujeo to 30 units daily.  2.  Continue Metformin 10020mtwice daily. 3.  Counseled at length on why he needs to make his health a priority.  Explained that if he continues to allow his health deteriorate, he will not be able to do the other activities he enjoys. 4.  Counseled on why he needs to make time for routine exercise.  Goal is 30-45 minutes 5 x week. 5.  Counseled on nutrition goals and meal planning. 6.  BP at goal <130/80 7.  Will forward oxycodone questions to Dr CaEulas Post

## 2017-02-21 NOTE — Patient Instructions (Signed)
Increase Toujeo 30 units daily

## 2017-02-22 LAB — CBC WITH DIFFERENTIAL/PLATELET
Basophils Absolute: 51 cells/uL (ref 0–200)
Basophils Relative: 0.9 %
EOS ABS: 103 {cells}/uL (ref 15–500)
Eosinophils Relative: 1.8 %
HCT: 41 % (ref 38.5–50.0)
Hemoglobin: 13.5 g/dL (ref 13.2–17.1)
Lymphs Abs: 1146 cells/uL (ref 850–3900)
MCH: 29.7 pg (ref 27.0–33.0)
MCHC: 32.9 g/dL (ref 32.0–36.0)
MCV: 90.1 fL (ref 80.0–100.0)
MPV: 12.3 fL (ref 7.5–12.5)
Monocytes Relative: 9.6 %
NEUTROS PCT: 67.6 %
Neutro Abs: 3853 cells/uL (ref 1500–7800)
PLATELETS: 116 10*3/uL — AB (ref 140–400)
RBC: 4.55 10*6/uL (ref 4.20–5.80)
RDW: 13.3 % (ref 11.0–15.0)
TOTAL LYMPHOCYTE: 20.1 %
WBC: 5.7 10*3/uL (ref 3.8–10.8)
WBCMIX: 547 {cells}/uL (ref 200–950)

## 2017-02-22 LAB — URINALYSIS, ROUTINE W REFLEX MICROSCOPIC
Bilirubin Urine: NEGATIVE
Hgb urine dipstick: NEGATIVE
KETONES UR: NEGATIVE
Leukocytes, UA: NEGATIVE
NITRITE: NEGATIVE
PH: 5.5 (ref 5.0–8.0)
Protein, ur: NEGATIVE
Specific Gravity, Urine: 1.023 (ref 1.001–1.03)

## 2017-02-22 LAB — TEST AUTHORIZATION

## 2017-02-22 LAB — MICROALBUMIN / CREATININE URINE RATIO
Creatinine, Urine: 136 mg/dL (ref 20–320)
MICROALB/CREAT RATIO: 60 ug/mg{creat} — AB (ref <30)
Microalb, Ur: 8.2 mg/dL

## 2017-02-22 LAB — HEMOGLOBIN A1C
EAG (MMOL/L): 11.9 (calc)
HEMOGLOBIN A1C: 9.1 %{Hb} — AB (ref <5.7)
MEAN PLASMA GLUCOSE: 214 (calc)

## 2017-02-22 LAB — LIPID PANEL
CHOL/HDL RATIO: 3.2 (calc) (ref <5.0)
CHOLESTEROL: 113 mg/dL (ref <200)
HDL: 35 mg/dL — ABNORMAL LOW (ref >40)
LDL CHOLESTEROL (CALC): 61 mg/dL
NON-HDL CHOLESTEROL (CALC): 78 mg/dL (ref <130)
Triglycerides: 89 mg/dL (ref <150)

## 2017-02-22 LAB — TSH: TSH: 1.63 mIU/L (ref 0.40–4.50)

## 2017-02-22 NOTE — Telephone Encounter (Signed)
Ok to change to 2 tabs in AM and 2 tabs in PM. #120

## 2017-02-22 NOTE — Telephone Encounter (Signed)
Synetta Fail please make notation, rx due on Friday

## 2017-02-22 NOTE — Telephone Encounter (Signed)
Patient  was called and is aware of medication dosage change

## 2017-02-25 ENCOUNTER — Other Ambulatory Visit: Payer: Self-pay | Admitting: *Deleted

## 2017-02-25 MED ORDER — OXYCODONE-ACETAMINOPHEN 5-325 MG PO TABS: ORAL_TABLET | ORAL | 0 refills | Status: DC

## 2017-02-25 NOTE — Telephone Encounter (Signed)
Patient requested NCCSRS Database Verified Pharmacy Confirmed Dosage change per Dr. Rodney Booze Rx and sent to Dr. Montez Morita for approval.

## 2017-03-02 ENCOUNTER — Encounter: Payer: Self-pay | Admitting: Physician Assistant

## 2017-03-02 ENCOUNTER — Ambulatory Visit (INDEPENDENT_AMBULATORY_CARE_PROVIDER_SITE_OTHER): Payer: BLUE CROSS/BLUE SHIELD | Admitting: Physician Assistant

## 2017-03-02 VITALS — BP 130/78 | HR 80 | Ht 66.0 in | Wt 262.0 lb

## 2017-03-02 DIAGNOSIS — Z1211 Encounter for screening for malignant neoplasm of colon: Secondary | ICD-10-CM

## 2017-03-02 DIAGNOSIS — Z01818 Encounter for other preprocedural examination: Secondary | ICD-10-CM | POA: Diagnosis not present

## 2017-03-02 MED ORDER — PEG 3350-KCL-NA BICARB-NACL 420 G PO SOLR: 4000.0000 mL | Freq: Once | ORAL | 0 refills | Status: AC

## 2017-03-02 NOTE — Patient Instructions (Addendum)
You have been scheduled for a colonoscopy. Please follow written instructions given to you at your visit today.  Please pick up your prep supplies at the pharmacy within the next 1-3 days. If you use inhalers (even only as needed), please bring them with you on the day of your procedure. Your physician has requested that you go to www.startemmi.com and enter the access code given to you at your visit today. This web site gives a general overview about your procedure. However, you should still follow specific instructions given to you by our office regarding your preparation for the procedure.  _  _   ORAL DIABETIC MEDICATION INSTRUCTIONS  The day before your procedure:  Take your diabetic pill as you do normally  The day of your procedure:  Do not take your diabetic pill   We will check your blood sugar levels during the admission process and again in Recovery before discharging you home  ______________________________________________________________________  _  _   INSULIN (LONG ACTING) MEDICATION INSTRUCTIONS (Lantus, NPH, 70/30, Humulin, Novolin-N, Levemir, Toujeo )   The day before your procedure:  Take  your regular evening dose    The day of your procedure:  Do not take your morning dose

## 2017-03-02 NOTE — Progress Notes (Signed)
Chief Complaint: Consult for screening colonoscopy in a patient on chronic anticoagulation  HPI:    Barry [name] is a 62 year old Caucasian male with a past medical history as listed below status post CABG x3, last in 2014 maintained on Plavix, who was referred to me by Gildardo Cranker, DO for a consult of a screening colonoscopy.      Patient was initially seen in clinic on 10/25/12 by Barry [name] and assigned to Barry [name].  At that time he was referred for colon cancer screening but was status post recent MI and recent CABG and the procedure was postponed.    Today, the patient presents to clinic and explains that he is trying to get "all of his health things in order".  Patient tells me that he was again told that he should have a screening colonoscopy.  He has not had any other acute heart event since 2014 and has been maintained on Plavix daily since that time.  Patient does tell me that he has been having some trouble keeping his "diabetes under control".  The patient also describes a "chronic numbness on my right side".  Patient also describes some morning fatigue and joint aches and pains.  Apparently he has been scheduled with his cardiologist who he has not seen within the past year or so just to "make sure everything is okay".  The patient tells me that he would like to have his colonoscopy if it is okay to do so.    Patient denies a fever, chills, blood in stool, melena, weight loss, change in bowel habits, abdominal pain, heartburn, reflux or symptoms that awaken him at night.  Past Medical History:  Diagnosis Date  . Coronary artery disease   . MI (myocardial infarction) ([place]) 04/23/2007   inferior wall  . Morbid obesity ([place]) 06/26/2012  . S/P CABG x 3 07/04/2012   LIMA to LAD, SVG to D1, SVG to PDA, EVH via right thigh  . Sleep apnea   . Type II or unspecified type diabetes mellitus without mention of complication, not stated as uncontrolled   . Unspecified essential hypertension      Past Surgical History:  Procedure Laterality Date  . CORONARY ANGIOPLASTY WITH STENT PLACEMENT  04/23/2007   PCI and stenting of mid RCA - Dr Donnetta Hutching @ [facility]  . CORONARY ARTERY BYPASS GRAFT N/A 07/04/2012   Procedure: CORONARY ARTERY BYPASS GRAFTING (CABG);  Surgeon: Rexene Alberts, MD;  Location: [facility];  Service: Open Heart Surgery;  Laterality: N/A;  x3 using right greater saphenous vein and left internal mammary.   . INTRAOPERATIVE TRANSESOPHAGEAL ECHOCARDIOGRAM N/A 07/04/2012   Procedure: INTRAOPERATIVE TRANSESOPHAGEAL ECHOCARDIOGRAM;  Surgeon: Rexene Alberts, MD;  Location: [facility];  Service: Open Heart Surgery;  Laterality: N/A;  . LEFT HEART CATHETERIZATION WITH CORONARY ANGIOGRAM N/A 06/25/2012   Procedure: LEFT HEART CATHETERIZATION WITH CORONARY ANGIOGRAM;  Surgeon: Lorretta Harp, MD;  Location: [facility] CATH LAB;  Service: Cardiovascular;  Laterality: N/A;    Current Outpatient Medications  Medication Sig Dispense Refill  . albuterol (PROVENTIL HFA;VENTOLIN HFA) 108 (90 Base) MCG/ACT inhaler One puff three times daily for wheezing or shortness of breath 1 Inhaler 5  . aspirin 81 MG chewable tablet Chew 81 mg by mouth once.     Marland Kitchen atorvastatin (LIPITOR) 80 MG tablet TAKE 1 TABLET BY MOUTH DAILY 90 tablet 1  . Blood Glucose Monitoring Suppl (CONTOUR NEXT EZ MONITOR) w/Device KIT Test blood sugar three times daily E11.22 1 kit  12  . busPIRone (BUSPAR) 15 MG tablet TAKE 1 TABLET BY MOUTH THREE TIMES DAILY FOR ANXIETY 90 tablet 1  . clopidogrel (PLAVIX) 75 MG tablet take 1 tablet by mouth once daily 90 tablet 1  . glucose blood (BAYER CONTOUR NEXT TEST) test strip Use as instructed 100 each 12  . Insulin Glargine (TOUJEO SOLOSTAR) 300 UNIT/ML SOPN Inject 30 Units into the skin daily at 6 (six) AM. 5 pen 5  . Insulin Pen Needle 32G X 4 MM MISC Use as Directed. Dx: E11.40 100 each 11  . lisinopril (PRINIVIL,ZESTRIL) 5 MG tablet TAKE 1 TABLET BY MOUTH DAILY 90 tablet 1  . meloxicam (MOBIC) 7.5  MG tablet Take 1 tablet (7.5 mg total) by mouth daily. 30 tablet 6  . metFORMIN (GLUCOPHAGE) 1000 MG tablet Take 1 tablet (1,000 mg total) by mouth 2 (two) times daily. 180 tablet 1  . metoprolol tartrate (LOPRESSOR) 25 MG tablet Take 0.5 tablets (12.5 mg total) by mouth 2 (two) times daily. 90 tablet 1  . omeprazole (PRILOSEC) 40 MG capsule TAKE 1 CAPSULE BY MOUTH DAILY 90 capsule 1  . oxyCODONE-acetaminophen (PERCOCET/ROXICET) 5-325 MG tablet Take 2 tablets by mouth in the morning, and Take 2 tablets by mouth in the evening 120 tablet 0  . sertraline (ZOLOFT) 100 MG tablet TAKE 1 AND 1/2 TABLETS BY MOUTH AT BEDTIME 135 tablet 3  . sildenafil (VIAGRA) 100 MG tablet take 1 tablet by mouth once daily as directed 5 tablet 3  . zolpidem (AMBIEN) 10 MG tablet Take 1 tablet (10 mg total) by mouth at bedtime. 30 tablet 0   No current facility-administered medications for this visit.     Allergies as of 03/02/2017  . (No Known Allergies)    Family History  Problem Relation Age of Onset  . Cancer Mother   . Diabetes Sister   . Diabetes Brother     Social History   Socioeconomic History  . Marital status: Divorced    Spouse name: Not on file  . Number of children: 1  . Years of education: Not on file  . Highest education level: Not on file  Social Needs  . Financial resource strain: Not on file  . Food insecurity - worry: Not on file  . Food insecurity - inability: Not on file  . Transportation needs - medical: Not on file  . Transportation needs - non-medical: Not on file  Occupational History  . Occupation: MAINTENANCE    Employer: [employer]  Tobacco Use  . Smoking status: Never Smoker  . Smokeless tobacco: Never Used  Substance and Sexual Activity  . Alcohol use: No    Alcohol/week: 0.0 oz  . Drug use: No  . Sexual activity: Not on file  Other Topics Concern  . Not on file  Social History Narrative  . Not on file    Review of Systems:    Constitutional: No  weight loss, fever or chills Skin: No rash  Cardiovascular: No chest pain Respiratory: No SOB Gastrointestinal: See HPI and otherwise negative Genitourinary: No dysuria  Neurological: No headache Musculoskeletal: Positive for muscle and joint pain Hematologic: No bleeding  Psychiatric:Positive for depression   Physical Exam:  Vital signs: BP 130/78   Pulse 80   Ht _0  (1.676 m)   Wt 262 lb (118.8 kg)   BMI 42.29 kg/m   Constitutional:   Pleasant obese Caucasian male appears to be in NAD, Well developed, Well nourished, alert and cooperative Head:  Normocephalic and atraumatic. Eyes:   PEERL, EOMI. No icterus. Conjunctiva pink. Ears:  Normal auditory acuity. Neck:  Supple Throat: Oral cavity and pharynx without inflammation, swelling or lesion.  Respiratory: Respirations even and unlabored. Lungs clear to auscultation bilaterally.   No wheezes, crackles, or rhonchi.  Cardiovascular: Normal S1, S2. No MRG. Regular rate and rhythm. No peripheral edema, cyanosis or pallor.  Gastrointestinal:  Soft, nondistended, nontender. No rebound or guarding. Normal bowel sounds. No appreciable masses or hepatomegaly. Rectal:  Not performed.  Msk:  Symmetrical without gross deformities. Without edema, no deformity or joint abnormality.  Neurologic:  Alert and  oriented x4;  grossly normal neurologically.  Skin:   Dry and intact without significant lesions or rashes. Psychiatric:  Demonstrates good judgement and reason without abnormal affect or behaviors.  MOST RECENT LABS AND IMAGING: CBC    Component Value Date/Time   WBC 5.7 02/16/2017 0925   RBC 4.55 02/16/2017 0925   HGB 13.5 02/16/2017 0925   HCT 41.0 02/16/2017 0925   PLT 116 (L) 02/16/2017 0925   MCV 90.1 02/16/2017 0925   MCH 29.7 02/16/2017 0925   MCHC 32.9 02/16/2017 0925   RDW 13.3 02/16/2017 0925   RDW 13.9 03/21/2014 0756   LYMPHSABS 1,146 02/16/2017 0925   LYMPHSABS 1.9 03/21/2014 0756   MONOABS 531 12/04/2015 0833    EOSABS 103 02/16/2017 0925   EOSABS 0.1 03/21/2014 0756   BASOSABS 51 02/16/2017 0925   BASOSABS 0.1 03/21/2014 0756    CMP     Component Value Date/Time   NA 136 11/22/2016 0927   NA 141 06/18/2015 0828   K 4.7 11/22/2016 0927   CL 100 11/22/2016 0927   CO2 27 11/22/2016 0927   GLUCOSE 291 (H) 11/22/2016 0927   BUN 16 11/22/2016 0927   BUN 17 06/18/2015 0828   CREATININE 0.96 11/22/2016 0927   CALCIUM 9.2 11/22/2016 0927   PROT 7.7 11/22/2016 0927   PROT 7.3 05/30/2015 0859   ALBUMIN 3.8 03/24/2016 0826   ALBUMIN 4.3 05/30/2015 0859   AST 56 (H) 11/22/2016 0927   ALT 61 (H) 11/22/2016 0927   ALKPHOS 80 03/24/2016 0826   BILITOT 0.6 11/22/2016 0927   BILITOT 0.5 05/30/2015 0859   GFRNONAA 86 11/22/2016 0927   GFRAA 100 11/22/2016 0927    Assessment: 1.  Screening for colorectal cancer: Patient has never had a screening colonoscopy 2.  Chronic anticoagulation: For CABG in 2014 on Plavix 3.  S/p CABG  Plan: 1.  Discussed with the patient that pending his office visit with Barry [name] in late February and his recommendations as far as a cardiac clearance, we could go ahead and schedule his colonoscopy in [place] with Barry [name]. 2.  Discussed risk, benefits, limitations and alternatives to a colonoscopy and the patient agrees to proceed. 3.  Barry [name] ahead and scheduled the patient for Barry [name] latest appointment which was April 12.  Discussed with the patient that if he has any changes in health or concerns before that time he may call our clinic. 4.  Discussed with the patient I would like to him to hold his Plavix for 5 days prior to time of his procedure.  Again we will send a note to Barry [name] to ensure that holding his Plavix is okay for this patient. 5.  Patient to follow in clinic after time of procedure per recommendations from Barry [name].  Barry Newer, PA-C [facility] 03/02/2017, 9:00 AM  Cc: Gildardo Cranker, DO

## 2017-03-02 NOTE — Progress Notes (Signed)
I agree with the above note, plan 

## 2017-03-08 ENCOUNTER — Encounter: Payer: Self-pay | Admitting: Neurology

## 2017-03-08 ENCOUNTER — Ambulatory Visit (INDEPENDENT_AMBULATORY_CARE_PROVIDER_SITE_OTHER): Payer: BLUE CROSS/BLUE SHIELD | Admitting: Neurology

## 2017-03-08 DIAGNOSIS — E1140 Type 2 diabetes mellitus with diabetic neuropathy, unspecified: Secondary | ICD-10-CM

## 2017-03-08 DIAGNOSIS — Z794 Long term (current) use of insulin: Secondary | ICD-10-CM

## 2017-03-08 DIAGNOSIS — R202 Paresthesia of skin: Secondary | ICD-10-CM

## 2017-03-08 NOTE — Procedures (Signed)
[facility]  [address] [place], [address]  [place], [place] [address] Tel: [phone] Fax:  [phone] Test Date:  03/08/2017  Patient: Barry [name] DOB: 1955-12-04 Physician: Nita Sickle, DO  Sex: Male Height: 5\' 6"  Ref Phys: Johann Capers  ID#: [id number] Temp: 34.2C Technician:    Patient Complaints: This is a 62 year-old man referred for evaluation of bilateral hand pain, worse on the right.  NCV & EMG Findings: Extensive electrodiagnostic testing of the right upper extremity and additional studies of the left shows:  1. Right median sensory response shows reduced amplitude and bilateral median sensory responses show prolonged latency.  Bilateral ulnar sensory responses showed reduced amplitudes and the latency is prolonged on the left. Bilateral radial sensory responses are within normal limits. 2. Bilateral median motor responses show reduced amplitude with evidence of anomalous innervation to the abductor pollicis brevis, as noted by a motor response when stimulating at the ulnar wrist, consistent with a Martin-Gruber anastomosis. Of note, left median motor response shows prolonged latency. The left ulnar motor response shows reduced amplitude and conduction block in the forearm with preserved conduction velocity.  3. Chronic motor axon loss changes are seen affecting the distal hand muscles bilaterally, worse on the left.  There is no evidence of accompanied active denervation.   Impression: 1. The electrophysiologic findings are most consistent with a sensorimotor polyneuropathy, demyelinating and axon loss in type, affecting the upper extremities and worse on the left. 2. Incidentally, there is a Martin-Gruber anastomosis bilaterally, normal anatomical variant.  ___________________________ Nita Sickle, DO    Nerve Conduction Studies Anti Sensory Summary Table   Stim Site NR Peak (ms) Norm Peak (ms) P-T Amp (V) Norm P-T Amp  Left Median Anti Sensory (2nd Digit)   34.2C  Wrist    3.9 <3.6 20.3 >15  Right Median Anti Sensory (2nd Digit)  34.2C  Wrist    3.8 <3.6 14.3 >15  Left Radial Anti Sensory (Base 1st Digit)  34.2C  Wrist    2.3 <2.7 14.8 >14  Right Radial Anti Sensory (Base 1st Digit)  34.2C  Wrist    2.3 <2.7 15.2 >14  Left Ulnar Anti Sensory (5th Digit)  Wrist    3.2 <3.1 5.2 >10  Right Ulnar Anti Sensory (5th Digit)  34.2C  Wrist    2.8 <3.1 9.2 >10   Motor Summary Table   Stim Site NR Onset (ms) Norm Onset (ms) O-P Amp (mV) Norm O-P Amp Site1 Site2 Delta-0 (ms) Dist (cm) Vel (m/s) Norm Vel (m/s)  Left Median Motor (Abd Poll Brev)  34.2C  Wrist    4.3 <4.0 4.3 >6 Elbow Wrist 4.9 29.0 59 >50  Elbow    9.2  4.0  Ulnar-wrist crossover Elbow 4.0 0.0    Ulnar-wrist crossover    5.2  2.7         Right Median Motor (Abd Poll Brev)  34.2C  Wrist    4.0 <4.0 2.9 >6 Elbow Wrist 5.1 28.0 55 >50  Elbow    9.1  2.9  Ulnar-wristcrossover Elbow 4.8 0.0    Ulnar-wristcrossover    4.3  4.1         Left Ulnar Motor (Abd Dig Minimi)  34.2C  Wrist    2.7 <3.1 5.7 >7 B Elbow Wrist 3.9 25.0 64 >50  B Elbow    6.6  2.9  A Elbow B Elbow 1.8 10.0 56 >50  A Elbow    8.4  2.7  Right Ulnar Motor (Abd Dig Minimi)  34.2C  Wrist    2.3 <3.1 8.4 >7 B Elbow Wrist 3.9 23.0 59 >50  B Elbow    6.2  7.2  A Elbow B Elbow 1.6 10.0 63 >50  A Elbow    7.8  7.1          EMG   Side Muscle Ins Act Fibs Psw Fasc Number Recrt Dur Dur. Amp Amp. Poly Poly. Comment  Left 1stDorInt Nml Nml Nml Nml 1- Rapid Some 1+ Some 1+ Nml Nml N/A  Left Abd Poll Brev Nml Nml Nml Nml 1- Rapid Few 1+ Few 1+ Nml Nml N/A  Left Ext Indicis Nml Nml Nml Nml 1- Rapid Some 1+ Some 1+ Nml Nml N/A  Left PronatorTeres Nml Nml Nml Nml Nml Nml Nml Nml Nml Nml Nml Nml N/A  Left Biceps Nml Nml Nml Nml Nml Nml Nml Nml Nml Nml Nml Nml N/A  Left Triceps Nml Nml Nml Nml Nml Nml Nml Nml Nml Nml Nml Nml N/A  Left Deltoid Nml Nml Nml Nml Nml Nml Nml Nml Nml Nml Nml Nml N/A  Left ABD Dig Min Nml  Nml Nml Nml 1- Rapid Some 1+ Few 1+ Nml Nml N/A  Right 1stDorInt Nml Nml Nml Nml Nml Nml Nml Nml Nml Nml Nml Nml N/A  Right Abd Poll Brev Nml Nml Nml Nml 1- Rapid Some 1+ Some 1+ Nml Nml N/A  Right Ext Indicis Nml Nml Nml Nml Nml Nml Nml Nml Nml Nml Nml Nml N/A  Right PronatorTeres Nml Nml Nml Nml Nml Nml Nml Nml Nml Nml Nml Nml N/A  Right Biceps Nml Nml Nml Nml Nml Nml Nml Nml Nml Nml Nml Nml N/A  Right Triceps Nml Nml Nml Nml Nml Nml Nml Nml Nml Nml Nml Nml N/A  Right Deltoid Nml Nml Nml Nml Nml Nml Nml Nml Nml Nml Nml Nml N/A  Left FlexCarpiUln Nml Nml Nml Nml Nml Nml Nml Nml Nml Nml Nml Nml N/A      Waveforms:

## 2017-03-10 ENCOUNTER — Other Ambulatory Visit: Payer: Self-pay

## 2017-03-10 DIAGNOSIS — G629 Polyneuropathy, unspecified: Secondary | ICD-10-CM

## 2017-03-14 ENCOUNTER — Telehealth: Payer: Self-pay

## 2017-03-14 NOTE — Telephone Encounter (Signed)
Per Dee/CMA, Aram Beecham Emergency planning/management officer) asked her to review patient's chart to see where else he was to be referred to. Patient called to ask for he was confused  [facility] asked me if I was aware of where else patient to be referred to for I mostly work with Dr.Carter.   I called patient to see how I could be of assistant to him, patient states he spoke with someone last week and was told he would be referred somewhere and does not remember.   I reviewed chart, patient spoke with Coralie Carpen about NCV with EMG results and informed patient we will refer to Neurology. Patient to await call from Neurology. Patient verbalized understanding of referral process.

## 2017-03-18 ENCOUNTER — Encounter: Payer: Self-pay | Admitting: Neurology

## 2017-03-18 ENCOUNTER — Other Ambulatory Visit: Payer: BLUE CROSS/BLUE SHIELD

## 2017-03-18 ENCOUNTER — Ambulatory Visit (INDEPENDENT_AMBULATORY_CARE_PROVIDER_SITE_OTHER): Payer: BLUE CROSS/BLUE SHIELD | Admitting: Neurology

## 2017-03-18 ENCOUNTER — Other Ambulatory Visit: Payer: Self-pay | Admitting: Internal Medicine

## 2017-03-18 VITALS — BP 110/70 | HR 55 | Ht 66.0 in | Wt 261.2 lb

## 2017-03-18 DIAGNOSIS — E1140 Type 2 diabetes mellitus with diabetic neuropathy, unspecified: Secondary | ICD-10-CM

## 2017-03-18 DIAGNOSIS — Z794 Long term (current) use of insulin: Secondary | ICD-10-CM

## 2017-03-18 DIAGNOSIS — G8929 Other chronic pain: Secondary | ICD-10-CM

## 2017-03-18 DIAGNOSIS — M2550 Pain in unspecified joint: Secondary | ICD-10-CM

## 2017-03-18 NOTE — Patient Instructions (Addendum)
Check labs.  We will call you with the results. Your provider has requested that you have labwork completed today. Please go to [facility] ([id number]) on the second floor of this building before leaving the office today. You do not need to check in. If you are not called within 15 minutes please check with the front desk.

## 2017-03-18 NOTE — Progress Notes (Signed)
[facility] Clinic Note - Initial Visit   Date: 03/18/17  Barry [name] MRN: [id number] DOB: 05/20/1955   Dear Dr. Eulas Post:   Thank you for your kind referral of Barry [name] for consultation of neuropathy. Although his history is well known to you, please allow [place] to reiterate it for the purpose of our medical record. The patient was accompanied to the clinic by self.  History of Present Illness: Barry [name] is a 62 y.o. Caucasian male with poorly controlled diabetes mellitus, hypertension, hyperlipidemia, OSA, CAD s/p CABG, and chronic pain presenting for evaluation of neuropathy.    Patient recently underwent NCS/EMG of the upper extremities to evaluate clinical findings of positive Tinel's sign on the right which showed a chronic sensorimotor polyneuropathy affecting both upper extremities.   He was asked to see me to discuss these results.  Patient has been diabetic since 2014 and has been on insulin since this time.  For many years, he has chronic achy and throbbing pain of the shoulder, knees, back and chest. Over the past two months, he has noticed new swelling of the hands and worsening right shoulder pain.  When specifically asked about neuropathic pain, he endorses some tingling of the hands and feet, but is moreso concerned about his chest pain and polyarthralgias.  He walks unassisted and has some imbalance.  He denies any weakness.  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    Out-side paper records, electronic medical record, and images have been reviewed where available and summarized as:  Lab Results  Component Value Date   TSH 1.63 02/16/2017   Lab Results  Component Value Date   HGBA1C 9.1 (H) 02/16/2017   NCS/EMG of the upper extremities 03/08/2017:   The electrophysiologic findings are most consistent with a sensorimotor polyneuropathy, demyelinating and axon loss in type, affecting  the upper extremities and worse on the left. Incidentally, there is a Martin-Gruber anastomosis bilaterally, normal anatomical variant.   Past Medical History:  Diagnosis Date  . Coronary artery disease   . MI (myocardial infarction) ([place]) 04/23/2007   inferior wall  . Morbid obesity ([place]) 06/26/2012  . S/P CABG x 3 07/04/2012   LIMA to LAD, SVG to D1, SVG to PDA, EVH via right thigh  . Sleep apnea   . Type II or unspecified type diabetes mellitus without mention of complication, not stated as uncontrolled   . Unspecified essential hypertension     Past Surgical History:  Procedure Laterality Date  . CORONARY ANGIOPLASTY WITH STENT PLACEMENT  04/23/2007   PCI and stenting of mid RCA - Dr Donnetta Hutching @ [facility]  . CORONARY ARTERY BYPASS GRAFT N/A 07/04/2012   Procedure: CORONARY ARTERY BYPASS GRAFTING (CABG);  Surgeon: Rexene Alberts, MD;  Location: [facility];  Service: Open Heart Surgery;  Laterality: N/A;  x3 using right greater saphenous vein and left internal mammary.   . INTRAOPERATIVE TRANSESOPHAGEAL ECHOCARDIOGRAM N/A 07/04/2012   Procedure: INTRAOPERATIVE TRANSESOPHAGEAL ECHOCARDIOGRAM;  Surgeon: Rexene Alberts, MD;  Location: [facility];  Service: Open Heart Surgery;  Laterality: N/A;  . LEFT HEART CATHETERIZATION WITH CORONARY ANGIOGRAM N/A 06/25/2012   Procedure: LEFT HEART CATHETERIZATION WITH CORONARY ANGIOGRAM;  Surgeon: Lorretta Harp, MD;  Location: [facility] CATH LAB;  Service: Cardiovascular;  Laterality: N/A;     Medications:  Outpatient Encounter Medications as of 03/18/2017  Medication Sig  . albuterol (PROVENTIL HFA;VENTOLIN HFA) 108 (90  Base) MCG/ACT inhaler One puff three times daily for wheezing or shortness of breath  . aspirin 81 MG chewable tablet Chew 81 mg by mouth once.   Marland Kitchen atorvastatin (LIPITOR) 80 MG tablet TAKE 1 TABLET BY MOUTH DAILY  . Blood Glucose Monitoring Suppl (CONTOUR NEXT EZ MONITOR) w/Device KIT Test blood sugar three times daily E11.22  . busPIRone (BUSPAR) 15 MG  tablet TAKE 1 TABLET BY MOUTH THREE TIMES DAILY FOR ANXIETY  . clopidogrel (PLAVIX) 75 MG tablet take 1 tablet by mouth once daily  . glucose blood (BAYER CONTOUR NEXT TEST) test strip Use as instructed  . Insulin Glargine (TOUJEO SOLOSTAR) 300 UNIT/ML SOPN Inject 30 Units into the skin daily at 6 (six) AM.  . Insulin Pen Needle 32G X 4 MM MISC Use as Directed. Dx: E11.40  . lisinopril (PRINIVIL,ZESTRIL) 5 MG tablet TAKE 1 TABLET BY MOUTH DAILY  . meloxicam (MOBIC) 7.5 MG tablet Take 1 tablet (7.5 mg total) by mouth daily.  . metFORMIN (GLUCOPHAGE) 1000 MG tablet Take 1 tablet (1,000 mg total) by mouth 2 (two) times daily.  . metoprolol tartrate (LOPRESSOR) 25 MG tablet Take 0.5 tablets (12.5 mg total) by mouth 2 (two) times daily.  Marland Kitchen omeprazole (PRILOSEC) 40 MG capsule TAKE 1 CAPSULE BY MOUTH DAILY  . oxyCODONE-acetaminophen (PERCOCET/ROXICET) 5-325 MG tablet Take 2 tablets by mouth in the morning, and Take 2 tablets by mouth in the evening  . sertraline (ZOLOFT) 100 MG tablet TAKE 1 AND 1/2 TABLETS BY MOUTH AT BEDTIME  . sildenafil (VIAGRA) 100 MG tablet take 1 tablet by mouth once daily as directed  . zolpidem (AMBIEN) 10 MG tablet Take 1 tablet (10 mg total) by mouth at bedtime.   No facility-administered encounter medications on file as of 03/18/2017.      Allergies: No Known Allergies  Family History: Family History  Problem Relation Age of Onset  . Cancer Mother   . Diabetes Sister   . Diabetes Brother     Social History: Social History   Tobacco Use  . Smoking status: Never Smoker  . Smokeless tobacco: Never Used  Substance Use Topics  . Alcohol use: No    Alcohol/week: 0.0 oz  . Drug use: No   Social History   Social History Narrative   Lives alone in a one story home.  Has one daughter.  Works as a Air traffic controller.  Education: high school.     Review of Systems:  CONSTITUTIONAL: No fevers, chills, night sweats, or weight loss.   EYES: No visual changes  or eye pain ENT: No hearing changes.  No history of nose bleeds.   RESPIRATORY: No cough, wheezing and shortness of breath.   CARDIOVASCULAR: Negative for chest pain, and palpitations.   GI: Negative for abdominal discomfort, blood in stools or black stools.  No recent change in bowel habits.   GU:  No history of incontinence.   MUSCLOSKELETAL: +history of joint pain or swelling.  +myalgias.   SKIN: Negative for lesions, rash, and itching.   HEMATOLOGY/ONCOLOGY: Negative for prolonged bleeding, bruising easily, and swollen nodes.  No history of cancer.   ENDOCRINE: Negative for cold or heat intolerance, polydipsia or goiter.   PSYCH:  No depression or anxiety symptoms.   NEURO: As Above.   Vital Signs:  BP 110/70   Pulse (!) 55   Ht _0  (1.676 m)   Wt 261 lb 4 oz (118.5 kg)   SpO2 97%   BMI 42.17 kg/m  General Medical Exam:   General:  Well appearing, comfortable.   Eyes/ENT: see cranial nerve examination.   Neck: No masses appreciated.  Full range of motion without tenderness.  No carotid bruits. Respiratory:  Clear to auscultation, good air entry bilaterally.   Cardiac:  Regular rate and rhythm, no murmur.   Extremities:  No deformities, edema, or skin discoloration.  Skin:  No rashes or lesions.  Neurological Exam: MENTAL STATUS including orientation to time, place, person, recent and remote memory, attention span and concentration, language, and fund of knowledge is normal.  Speech is not dysarthric.  CRANIAL NERVES: II:  No visual field defects.  Unremarkable fundi.   III-IV-VI: Pupils equal round and reactive to light.  Normal conjugate, extra-ocular eye movements in all directions of gaze.  No nystagmus.  No ptosis.   V:  Normal facial sensation.    VII:  Normal facial symmetry and movements.   VIII:  Normal hearing and vestibular function.   IX-X:  Normal palatal movement.   XI:  Normal shoulder shrug and head rotation.   XII:  Normal tongue strength and range  of motion, no deviation or fasciculation.  MOTOR:  No atrophy, fasciculations or abnormal movements.  No pronator drift.  Tone is normal.    Right Upper Extremity:    Left Upper Extremity:    Deltoid  5/5   Deltoid  5/5   Biceps  5/5   Biceps  5/5   Triceps  5/5   Triceps  5/5   Wrist extensors  5/5   Wrist extensors  5/5   Wrist flexors  5/5   Wrist flexors  5/5   Finger extensors  5/5   Finger extensors  5/5   Finger flexors  5/5   Finger flexors  5/5   Dorsal interossei  5/5   Dorsal interossei  5/5   Abductor pollicis  5/5   Abductor pollicis  5/5   Tone (Ashworth scale)  0  Tone (Ashworth scale)  0   Right Lower Extremity:    Left Lower Extremity:    Hip flexors  5/5   Hip flexors  5/5   Hip extensors  5/5   Hip extensors  5/5   Knee flexors  5/5   Knee flexors  5/5   Knee extensors  5/5   Knee extensors  5/5   Dorsiflexors  5/5   Dorsiflexors  5/5   Plantarflexors  5/5   Plantarflexors  5/5   Toe extensors  5/5   Toe extensors  5/5   Toe flexors  5/5   Toe flexors  5/5   Tone (Ashworth scale)  0  Tone (Ashworth scale)  0   MSRs:  Right                                                                 Left brachioradialis 2+  brachioradialis 2+  biceps 2+  biceps 2+  triceps 2+  triceps 2+  patellar 2+  patellar 2+  ankle jerk 1+  ankle jerk 1+  Hoffman no  Hoffman no  plantar response down  plantar response down   SENSORY:  Vibration and temperature is mildly reduced distally in the feet, otherwise sensation is intact throughout, including the hands. There is  mild sway with Romberg's testing.   COORDINATION/GAIT: Normal finger-to- nose-finger.  Intact rapid alternating movements bilaterally.  Able to rise from a chair without using arms.  Gait wide-based [name] to body habitus.  He is able to performed stressed gait and there is mild unsteadiness with tandem gait.   IMPRESSION: 1.  Diabetic neuropathy involving a stocking-glove distribution in the setting of poorly  controlled diabetes. NCS/EMG was reviewed with patient which shows chronic demyelinating and sensorimotor polyneuropathy.  Although he had diabetic neuropathy, his widespread body pain does not fit the nature of neuropathic pain, as he complaints more of "achy, throbbing" pain which is more suggestive of MSK pain.  Patient educated on diabetic neuropathy and the need to keep sugars well controlled with life style modification as well as daily foot exam. Check vitamin B12, vitamin B1, copper, SPEP with IFE, ESR, CRP to look for secondary causes of neuropathy.   2.  Possible lumbar canal stenosis given slightly increased reflexes in the legs as would be expected with patients with neuropathy.  Recommend MRI lumbar spine to evaluate further, but he would like to discuss this with his PCP.  Patient informed that even if there was lumbar canal stenosis, it would not explain any of his upper body and arm pain.   3.  Chronic pain syndrome, followed by PCP.    Thank you for allowing me to participate in patient's care.  If I can answer any additional questions, I would be pleased to do so.    Sincerely,    Darnelle Derrick K. Posey Pronto, DO

## 2017-03-21 ENCOUNTER — Other Ambulatory Visit (INDEPENDENT_AMBULATORY_CARE_PROVIDER_SITE_OTHER): Payer: BLUE CROSS/BLUE SHIELD

## 2017-03-21 DIAGNOSIS — D696 Thrombocytopenia, unspecified: Secondary | ICD-10-CM

## 2017-03-21 LAB — CBC WITH DIFFERENTIAL/PLATELET
Basophils Absolute: 48 cells/uL (ref 0–200)
Basophils Relative: 0.7 %
Eosinophils Absolute: 143 cells/uL (ref 15–500)
Eosinophils Relative: 2.1 %
HCT: 39.9 % (ref 38.5–50.0)
HEMOGLOBIN: 13.1 g/dL — AB (ref 13.2–17.1)
LYMPHS ABS: 1414 {cells}/uL (ref 850–3900)
MCH: 29.6 pg (ref 27.0–33.0)
MCHC: 32.8 g/dL (ref 32.0–36.0)
MCV: 90.3 fL (ref 80.0–100.0)
MPV: 12.3 fL (ref 7.5–12.5)
Monocytes Relative: 10.6 %
NEUTROS ABS: 4474 {cells}/uL (ref 1500–7800)
Neutrophils Relative %: 65.8 %
Platelets: 111 10*3/uL — ABNORMAL LOW (ref 140–400)
RBC: 4.42 10*6/uL (ref 4.20–5.80)
RDW: 13.2 % (ref 11.0–15.0)
Total Lymphocyte: 20.8 %
WBC mixed population: 721 cells/uL (ref 200–950)
WBC: 6.8 10*3/uL (ref 3.8–10.8)

## 2017-03-22 ENCOUNTER — Other Ambulatory Visit: Payer: Self-pay | Admitting: *Deleted

## 2017-03-22 DIAGNOSIS — G4700 Insomnia, unspecified: Secondary | ICD-10-CM

## 2017-03-22 MED ORDER — ZOLPIDEM TARTRATE 10 MG PO TABS: 10.0000 mg | ORAL_TABLET | Freq: Every day | ORAL | 0 refills | Status: DC

## 2017-03-22 NOTE — Telephone Encounter (Signed)
[facility] [place] [facility] 

## 2017-03-23 ENCOUNTER — Encounter: Payer: Self-pay | Admitting: Cardiovascular Disease

## 2017-03-23 ENCOUNTER — Ambulatory Visit (INDEPENDENT_AMBULATORY_CARE_PROVIDER_SITE_OTHER): Payer: BLUE CROSS/BLUE SHIELD | Admitting: Cardiovascular Disease

## 2017-03-23 VITALS — BP 138/78 | HR 61 | Ht 66.0 in | Wt 255.0 lb

## 2017-03-23 DIAGNOSIS — G4733 Obstructive sleep apnea (adult) (pediatric): Secondary | ICD-10-CM

## 2017-03-23 DIAGNOSIS — Z951 Presence of aortocoronary bypass graft: Secondary | ICD-10-CM

## 2017-03-23 DIAGNOSIS — R079 Chest pain, unspecified: Secondary | ICD-10-CM | POA: Diagnosis not present

## 2017-03-23 DIAGNOSIS — R0600 Dyspnea, unspecified: Secondary | ICD-10-CM | POA: Diagnosis not present

## 2017-03-23 DIAGNOSIS — I10 Essential (primary) hypertension: Secondary | ICD-10-CM

## 2017-03-23 DIAGNOSIS — E785 Hyperlipidemia, unspecified: Secondary | ICD-10-CM

## 2017-03-23 LAB — IMMUNOFIXATION ELECTROPHORESIS
IgG (Immunoglobin G), Serum: 1430 mg/dL (ref 694–1618)
IgM, Serum: 93 mg/dL (ref 48–271)
Immunoglobulin A: 387 mg/dL (ref 81–463)

## 2017-03-23 LAB — PROTEIN ELECTROPHORESIS, SERUM
ALBUMIN ELP: 4 g/dL (ref 3.8–4.8)
ALPHA 1: 0.3 g/dL (ref 0.2–0.3)
Alpha 2: 0.7 g/dL (ref 0.5–0.9)
BETA 2: 0.4 g/dL (ref 0.2–0.5)
Beta Globulin: 0.6 g/dL (ref 0.4–0.6)
Gamma Globulin: 1.3 g/dL (ref 0.8–1.7)
Total Protein: 7.3 g/dL (ref 6.1–8.1)

## 2017-03-23 LAB — C-REACTIVE PROTEIN: CRP: 3.6 mg/L (ref <8.0)

## 2017-03-23 LAB — VITAMIN B12: Vitamin B-12: 387 pg/mL (ref 200–1100)

## 2017-03-23 LAB — COPPER, SERUM: COPPER: 113 ug/dL (ref 70–175)

## 2017-03-23 LAB — VITAMIN B1: VITAMIN B1 (THIAMINE): 7 nmol/L — AB (ref 8–30)

## 2017-03-23 LAB — SEDIMENTATION RATE: Sed Rate: 22 mm/h — ABNORMAL HIGH (ref 0–20)

## 2017-03-23 NOTE — Addendum Note (Signed)
Addended by: Evans Lance on: 03/23/2017 10:07 AM   Modules accepted: Orders

## 2017-03-23 NOTE — Progress Notes (Signed)
03/23/2017 Atha Mcbain Scallon   1955-12-22  [id number]  Primary Physician Gildardo Cranker, DO Primary Cardiologist: Lorretta Harp MD FACP, [place], [place], [place]  HPI:  Barry [name] is a 62 y.o.  with history of CAD s/p MI about 5 years ago  I last saw him in the office 04/01/14. Marland Kitchen  He underwent coronary artery bypass grafting x3 by Dr. Lilly Cove on 07/04/12 after his DAPT washed out. . A LIMA to his LAD, vein to diagonal branch and PDA. He did have a 2-D echo performed 03/29/14 which was entirely normal. He was placed on Integrilin in the interim. He did well on his has recuperated nicely.   since I saw him back in 2016 he has done well until 3 months ago when he developed increasing dyspnea on exertion and some atypical chest pain. He also complains of profound fatigue.   Current Meds  Medication Sig  . albuterol (PROVENTIL HFA;VENTOLIN HFA) 108 (90 Base) MCG/ACT inhaler One puff three times daily for wheezing or shortness of breath  . aspirin 81 MG chewable tablet Chew 81 mg by mouth once.   Marland Kitchen atorvastatin (LIPITOR) 80 MG tablet TAKE 1 TABLET BY MOUTH DAILY  . Blood Glucose Monitoring Suppl (CONTOUR NEXT EZ MONITOR) w/Device KIT Test blood sugar three times daily E11.22  . busPIRone (BUSPAR) 15 MG tablet TAKE 1 TABLET BY MOUTH THREE TIMES DAILY FOR ANXIETY  . clopidogrel (PLAVIX) 75 MG tablet take 1 tablet by mouth once daily  . glucose blood (BAYER CONTOUR NEXT TEST) test strip Use as instructed  . Insulin Glargine (TOUJEO SOLOSTAR) 300 UNIT/ML SOPN Inject 30 Units into the skin daily at 6 (six) AM.  . Insulin Pen Needle 32G X 4 MM MISC Use as Directed. Dx: E11.40  . lisinopril (PRINIVIL,ZESTRIL) 5 MG tablet TAKE 1 TABLET BY MOUTH DAILY  . meloxicam (MOBIC) 7.5 MG tablet Take 1 tablet (7.5 mg total) by mouth daily.  . metFORMIN (GLUCOPHAGE) 1000 MG tablet Take 1 tablet (1,000 mg total) by mouth 2 (two) times daily.  . metoprolol tartrate (LOPRESSOR) 25 MG tablet Take 0.5 tablets (12.5 mg total) by  mouth 2 (two) times daily.  Marland Kitchen omeprazole (PRILOSEC) 40 MG capsule TAKE 1 CAPSULE BY MOUTH DAILY  . oxyCODONE-acetaminophen (PERCOCET/ROXICET) 5-325 MG tablet Take 2 tablets by mouth in the morning, and Take 2 tablets by mouth in the evening  . sertraline (ZOLOFT) 100 MG tablet TAKE 1 AND 1/2 TABLETS BY MOUTH AT BEDTIME  . sildenafil (VIAGRA) 100 MG tablet TAKE 1 TABLET BY MOUTH DAILY AS DIRECTED  . zolpidem (AMBIEN) 10 MG tablet Take 1 tablet (10 mg total) by mouth at bedtime.     No Known Allergies  Social History   Socioeconomic History  . Marital status: Divorced    Spouse name: Not on file  . Number of children: 1  . Years of education: Not on file  . Highest education level: Not on file  Social Needs  . Financial resource strain: Not on file  . Food insecurity - worry: Not on file  . Food insecurity - inability: Not on file  . Transportation needs - medical: Not on file  . Transportation needs - non-medical: Not on file  Occupational History  . Occupation: MAINTENANCE    Employer: [employer]  Tobacco Use  . Smoking status: Never Smoker  . Smokeless tobacco: Never Used  Substance and Sexual Activity  . Alcohol use: No    Alcohol/week: 0.0  oz  . Drug use: No  . Sexual activity: Not on file  Other Topics Concern  . Not on file  Social History Narrative   Lives alone in a one story home.  Has one daughter.  Works as a Air traffic controller.  Education: high school.      Review of Systems: General: negative for chills, fever, night sweats or weight changes.  Cardiovascular: negative for chest pain, dyspnea on exertion, edema, orthopnea, palpitations, paroxysmal nocturnal dyspnea or shortness of breath Dermatological: negative for rash Respiratory: negative for cough or wheezing Urologic: negative for hematuria Abdominal: negative for nausea, vomiting, diarrhea, bright red blood per rectum, melena, or hematemesis Neurologic: negative for visual changes, syncope,  or dizziness All other systems reviewed and are otherwise negative except as noted above.    Blood pressure 138/78, pulse 61, height '5\' 6"'  (1.676 m), weight 255 lb (115.7 kg).  General appearance: alert and no distress Neck: no adenopathy, no carotid bruit, no JVD, supple, symmetrical, trachea midline and thyroid not enlarged, symmetric, no tenderness/mass/nodules Lungs: clear to auscultation bilaterally Heart: regular rate and rhythm, S1, S2 normal, no murmur, click, rub or gallop Extremities: extremities normal, atraumatic, no cyanosis or edema Pulses: 2+ and symmetric Skin: Skin color, texture, turgor normal. No rashes or lesions Neurologic: Alert and oriented X 3, normal strength and tone. Normal symmetric reflexes. Normal coordination and gait  EKG sinus rhythm at 61 without ST or T-wave changes. I personally reviewed this EKG.  ASSESSMENT AND PLAN:   S/P CABG x 3 History of CAD status post coronary artery bypass grafting 3 by Dr. Roxy Manns 07/04/12 the LIMA to his LAD, vein to diagonal branch and PDA. His EF at that time was well-preserved. The last several months has developed increasing dyspnea on exertion and some atypical chest pain. An echocardiogram and a formal neurologic Myoview stress test to further evaluate.  Essential hypertension, benign History of essential hypertension blood pressure is 138/70. He is on lisinopril and metoprolol. Continue current meds at current dosing.  Hyperlipidemia LDL goal <70 History of hyperlipidemia on statin therapy with recent lipid profile performed 02/16/17 revealed a total posterior 113 and HDL of 35.  Dyspnea History of progressive dyspnea on exertion or last several months. He did have a normal ejection fraction by 2-D echo after his bypass surgery. I'm going to obtain a 2-D echo cardiogram and a pharmacologic Myoview stress test to rule out an ischemic etiology.  Obstructive sleep apnea History of obstructive sleep apnea currently not on  CPAP. We will follow-up with him with regards to his device.      Lorretta Harp MD FACP,FACC,FAHA, [facility] 03/23/2017 9:50 AM

## 2017-03-23 NOTE — Assessment & Plan Note (Signed)
History of essential hypertension blood pressure is 138/70. He is on lisinopril and metoprolol. Continue current meds at current dosing.

## 2017-03-23 NOTE — Assessment & Plan Note (Addendum)
History of obstructive sleep apnea currently not on CPAP currently for the last 2 years.Marland Kitchen He is symptomatic with nocturnal snoring and daytime somnolence. I'm referring him for a split night sleep study.Marland Kitchen

## 2017-03-23 NOTE — Assessment & Plan Note (Signed)
History of progressive dyspnea on exertion or last several months. He did have a normal ejection fraction by 2-D echo after his bypass surgery. I'm going to obtain a 2-D echo cardiogram and a pharmacologic Myoview stress test to rule out an ischemic etiology.

## 2017-03-23 NOTE — Patient Instructions (Addendum)
Medication Instructions: Your physician recommends that you continue on your current medications as directed. Please refer to the Current Medication list given to you today.   Testing/Procedures: Your physician has recommended that you have a sleep study. This test records several body functions during sleep, including: brain activity, eye movement, oxygen and carbon dioxide blood levels, heart rate and rhythm, breathing rate and rhythm, the flow of air through your mouth and nose, snoring, body muscle movements, and chest and belly movement.  Your physician has requested that you have an echocardiogram. Echocardiography is a painless test that uses sound waves to create images of your heart. It provides your doctor with information about the size and shape of your heart and how well your heart's chambers and valves are working. This procedure takes approximately one hour. There are no restrictions for this procedure.  Your physician has requested that you have a lexiscan myoview. For further information please visit [email or web address] Please follow instruction sheet, as given.  Follow-Up: Your physician recommends that you schedule a follow-up appointment after testing with Dr. Allyson Sabal.  If you need a refill on your cardiac medications before your next appointment, please call your pharmacy.

## 2017-03-23 NOTE — Assessment & Plan Note (Signed)
History of CAD status post coronary artery bypass grafting 3 by Dr. Cornelius Moras 07/04/12 the LIMA to his LAD, vein to diagonal branch and PDA. His EF at that time was well-preserved. The last several months has developed increasing dyspnea on exertion and some atypical chest pain. An echocardiogram and a formal neurologic Myoview stress test to further evaluate.

## 2017-03-23 NOTE — Assessment & Plan Note (Signed)
History of hyperlipidemia on statin therapy with recent lipid profile performed 02/16/17 revealed a total posterior 113 and HDL of 35.

## 2017-03-24 ENCOUNTER — Telehealth: Payer: Self-pay | Admitting: *Deleted

## 2017-03-24 NOTE — Telephone Encounter (Signed)
Left message giving patient results and instructions.   

## 2017-03-24 NOTE — Telephone Encounter (Signed)
-----   Message from Glendale Chard, DO sent at 03/24/2017  2:13 PM EST ----- Please inform patient that his vitamin B 1 is low and he needs to start vitamin B1 (thaimine) 100mg  daily.  Remaining labs are within normal limits. Thanks

## 2017-03-25 ENCOUNTER — Encounter: Payer: Self-pay | Admitting: *Deleted

## 2017-03-28 ENCOUNTER — Encounter: Payer: Self-pay | Admitting: Internal Medicine

## 2017-03-29 ENCOUNTER — Other Ambulatory Visit: Payer: Self-pay | Admitting: *Deleted

## 2017-03-29 MED ORDER — OXYCODONE-ACETAMINOPHEN 5-325 MG PO TABS: ORAL_TABLET | ORAL | 0 refills | Status: DC

## 2017-03-29 NOTE — Telephone Encounter (Signed)
Patient Requested Refill NCCSRS Database Verified LR 02/25/17 Pharmacy Confirmed Pended Rx and sent to Dr. Montez Morita for approval.

## 2017-03-30 ENCOUNTER — Telehealth (HOSPITAL_COMMUNITY): Payer: Self-pay

## 2017-03-30 ENCOUNTER — Ambulatory Visit (HOSPITAL_COMMUNITY): Payer: BLUE CROSS/BLUE SHIELD | Attending: Cardiology

## 2017-03-30 ENCOUNTER — Telehealth: Payer: Self-pay | Admitting: *Deleted

## 2017-03-30 ENCOUNTER — Other Ambulatory Visit: Payer: Self-pay

## 2017-03-30 DIAGNOSIS — I358 Other nonrheumatic aortic valve disorders: Secondary | ICD-10-CM | POA: Diagnosis not present

## 2017-03-30 DIAGNOSIS — Z951 Presence of aortocoronary bypass graft: Secondary | ICD-10-CM | POA: Insufficient documentation

## 2017-03-30 DIAGNOSIS — R079 Chest pain, unspecified: Secondary | ICD-10-CM | POA: Diagnosis not present

## 2017-03-30 DIAGNOSIS — I119 Hypertensive heart disease without heart failure: Secondary | ICD-10-CM | POA: Insufficient documentation

## 2017-03-30 DIAGNOSIS — I2510 Atherosclerotic heart disease of native coronary artery without angina pectoris: Secondary | ICD-10-CM | POA: Diagnosis not present

## 2017-03-30 DIAGNOSIS — I219 Acute myocardial infarction, unspecified: Secondary | ICD-10-CM | POA: Insufficient documentation

## 2017-03-30 DIAGNOSIS — R0600 Dyspnea, unspecified: Secondary | ICD-10-CM | POA: Diagnosis not present

## 2017-03-30 DIAGNOSIS — G4733 Obstructive sleep apnea (adult) (pediatric): Secondary | ICD-10-CM | POA: Diagnosis not present

## 2017-03-30 DIAGNOSIS — E785 Hyperlipidemia, unspecified: Secondary | ICD-10-CM | POA: Diagnosis not present

## 2017-03-30 MED ORDER — PERFLUTREN LIPID MICROSPHERE
1.0000 mL | INTRAVENOUS | Status: AC | PRN
  Administered 2017-03-30: 2 mL via INTRAVENOUS

## 2017-03-30 NOTE — Telephone Encounter (Signed)
Encounter complete. 

## 2017-03-30 NOTE — Telephone Encounter (Signed)
Called patient to inform him that his insurance carrier, [employer] has denied his HST. They State the patient had previous study and has not lost a large amount of weight warranting a new study. Patient will bring his old machine to the office on Friday when he comes for his stress test. I will check to see if his machine has auto capability to assess current pressure needs. If not, then we will have to order a new machine with auto titration in order to get patient started back on his therapy. Patient voiced his understanding of his situation and agrees with plan.

## 2017-04-01 ENCOUNTER — Telehealth: Payer: Self-pay | Admitting: *Deleted

## 2017-04-01 ENCOUNTER — Ambulatory Visit (HOSPITAL_COMMUNITY)
Admission: RE | Admit: 2017-04-01 | Discharge: 2017-04-01 | Disposition: A | Discharge status: Home / Self Care | Payer: BLUE CROSS/BLUE SHIELD | Source: Ambulatory Visit | Attending: Cardiology | Admitting: Cardiology

## 2017-04-01 DIAGNOSIS — R079 Chest pain, unspecified: Secondary | ICD-10-CM | POA: Insufficient documentation

## 2017-04-01 DIAGNOSIS — R0600 Dyspnea, unspecified: Secondary | ICD-10-CM

## 2017-04-01 LAB — MYOCARDIAL PERFUSION IMAGING
CHL CUP NUCLEAR SDS: 2
CHL CUP NUCLEAR SSS: 2
CSEPPHR: 72 {beats}/min
LV dias vol: 130 mL (ref 62–150)
LV sys vol: 48 mL
Rest HR: 55 {beats}/min
SRS: 0
TID: 1.05

## 2017-04-01 MED ORDER — REGADENOSON 0.4 MG/5ML IV SOLN
0.4000 mg | Freq: Once | INTRAVENOUS | Status: AC
  Administered 2017-04-01: 0.4 mg via INTRAVENOUS

## 2017-04-01 MED ORDER — TECHNETIUM TC 99M TETROFOSMIN IV KIT
32.6000 | PACK | Freq: Once | INTRAVENOUS | Status: AC | PRN
  Administered 2017-04-01: 32.6 via INTRAVENOUS
  Filled 2017-04-01: qty 33

## 2017-04-01 MED ORDER — TECHNETIUM TC 99M TETROFOSMIN IV KIT
10.9000 | PACK | Freq: Once | INTRAVENOUS | Status: AC | PRN
  Administered 2017-04-01: 10.9 via INTRAVENOUS
  Filled 2017-04-01: qty 11

## 2017-04-01 NOTE — Telephone Encounter (Signed)
Patient's insurance company denied HST ordered by Dr. Allyson Sabal since he had a in lab sleep study 4 years ago. Listed reason for denial " patient hasn't had a significant amount of weight warranting a new study." patient still has his ResMed Airsense 10 auto from his previous therapy. After consulting with [facility] it was decided that we can do a 2 week auto DL to obtain setting to possibly get patient restarted on his CPAP therapy. Order faxed to [facility] for auto titration and follow up download.

## 2017-04-05 ENCOUNTER — Other Ambulatory Visit: Payer: Self-pay | Admitting: *Deleted

## 2017-04-05 DIAGNOSIS — K219 Gastro-esophageal reflux disease without esophagitis: Secondary | ICD-10-CM

## 2017-04-05 MED ORDER — OMEPRAZOLE 40 MG PO CPDR: 40.0000 mg | DELAYED_RELEASE_CAPSULE | Freq: Every day | ORAL | 1 refills | Status: DC

## 2017-04-05 NOTE — Telephone Encounter (Signed)
Received fax from [facility] for refill on Omeprazole. Pended refill and sent to Dr. Montez Morita to approve due to "High Risk" warning.

## 2017-04-13 ENCOUNTER — Ambulatory Visit (INDEPENDENT_AMBULATORY_CARE_PROVIDER_SITE_OTHER): Payer: BLUE CROSS/BLUE SHIELD | Admitting: Cardiovascular Disease

## 2017-04-13 ENCOUNTER — Encounter: Payer: Self-pay | Admitting: Cardiovascular Disease

## 2017-04-13 DIAGNOSIS — R0789 Other chest pain: Secondary | ICD-10-CM | POA: Diagnosis not present

## 2017-04-13 NOTE — Patient Instructions (Signed)
   [facility] CARDIOVASCULAR DIVISION [facility] [address] [place] [address] Dept: [phone] Loc: [phone]  Barry [name]  04/13/2017  You are scheduled for a Cardiac Catheterization on Monday, March 25 with Dr. Nanetta Batty.  1. Please arrive at the [facility] (Main Entrance A) at [facility]: [address] [place], [place] [address] at 9:30 AM (two hours before your procedure to ensure your preparation). Free valet parking service is available.   Special note: Every effort is made to have your procedure done on time. Please understand that emergencies sometimes delay scheduled procedures.  2. Diet: Do not eat or drink anything after midnight prior to your procedure except sips of water to take medications.  3. Labs: Please come back tomorrow to have labs drawn.  4. Medication instructions in preparation for your procedure:  Stop taking, Glucophage (Metformin) on Sunday, March 24.    Hold all other diabetic medications including Toujeo on the morning of the procedure.  On the morning of your procedure, take your aspirin and Plavix/Clopidogrel and any morning medicines NOT listed above.  You may use sips of water.  5. Plan for one night stay--bring personal belongings. 6. Bring a current list of your medications and current insurance cards. 7. You MUST have a responsible person to drive you home. 8. Someone MUST be with you the first 24 hours after you arrive home or your discharge will be delayed. 9. Please wear clothes that are easy to get on and off and wear slip-on shoes.  Thank you for allowing [place] to care for you!   -- [facility]   Post-procedure Follow-Up:  Your physician recommends that you schedule a follow-up appointment 2 weeks after your CATH with Dr. Allyson Sabal.

## 2017-04-13 NOTE — Progress Notes (Signed)
Mr. Bruck returns today for follow-up of his echo and Myoview stress test which were performed 04/01/17 because of chest pain. He had subtle ischemia in the RCA territory with an echo that showed normal LV function with some segmental wall motion abnormalities. Hearing seems to have exertional chest pain. Based on this and at least 5 years post bypass surgery ,I'm going to proceed with outpatient diagnostic coronary angiography on Monday.The patient understands that risks included but are not limited to stroke (1 in 1000), death (1 in 1000), kidney failure [usually temporary] (1 in 500), bleeding (1 in 200), allergic reaction [possibly serious] (1 in 200). The patient understands and agrees to proceed  Naomii Kreger J. Andranik Jeune, M.D., FACP, FACC, FAHA, FSCAI [facility] [address] [place], [place]  [address]  [phone] 04/13/2017 4:29 PM  

## 2017-04-13 NOTE — Assessment & Plan Note (Signed)
Barry [name] returns today for follow-up of his echo and Myoview stress test which were performed 04/01/17 because of chest pain. He had subtle ischemia in the RCA territory with an echo that showed normal LV function with some segmental wall motion abnormalities. Hearing seems to have exertional chest pain. Based on this and at least 5 years post bypass surgery ,I'm going to proceed with outpatient diagnostic coronary angiography on Monday.The patient understands that risks included but are not limited to stroke (1 in 1000), death (1 in 1000), kidney failure [usually temporary] (1 in 500), bleeding (1 in 200), allergic reaction [possibly serious] (1 in 200). The patient understands and agrees to proceed

## 2017-04-13 NOTE — H&P (View-Only) (Signed)
Mr. Afable returns today for follow-up of his echo and Myoview stress test which were performed 04/01/17 because of chest pain. He had subtle ischemia in the RCA territory with an echo that showed normal LV function with some segmental wall motion abnormalities. Hearing seems to have exertional chest pain. Based on this and at least 5 years post bypass surgery ,I'm going to proceed with outpatient diagnostic coronary angiography on Monday.The patient understands that risks included but are not limited to stroke (1 in 1000), death (1 in 1000), kidney failure [usually temporary] (1 in 500), bleeding (1 in 200), allergic reaction [possibly serious] (1 in 200). The patient understands and agrees to proceed  [name], M.D., FACP, [facility], Kathryne Eriksson [facility] [address] [place], [place]  [address]  [phone] 04/13/2017 4:29 PM

## 2017-04-15 ENCOUNTER — Telehealth: Payer: Self-pay | Admitting: Cardiovascular Disease

## 2017-04-15 ENCOUNTER — Ambulatory Visit
Admission: RE | Admit: 2017-04-15 | Discharge: 2017-04-15 | Disposition: A | Discharge status: Home / Self Care | Payer: BLUE CROSS/BLUE SHIELD | Source: Ambulatory Visit | Attending: Cardiovascular Disease | Admitting: Cardiovascular Disease

## 2017-04-15 ENCOUNTER — Other Ambulatory Visit: Payer: Self-pay | Admitting: *Deleted

## 2017-04-15 DIAGNOSIS — K219 Gastro-esophageal reflux disease without esophagitis: Secondary | ICD-10-CM

## 2017-04-15 DIAGNOSIS — R0789 Other chest pain: Secondary | ICD-10-CM

## 2017-04-15 LAB — CBC WITH DIFFERENTIAL/PLATELET
BASOS ABS: 0 10*3/uL (ref 0.0–0.2)
BASOS: 1 %
EOS (ABSOLUTE): 0.2 10*3/uL (ref 0.0–0.4)
Eos: 3 %
HEMATOCRIT: 39.5 % (ref 37.5–51.0)
HEMOGLOBIN: 13 g/dL (ref 13.0–17.7)
Immature Grans (Abs): 0 10*3/uL (ref 0.0–0.1)
Immature Granulocytes: 0 %
LYMPHS ABS: 1.1 10*3/uL (ref 0.7–3.1)
Lymphs: 25 %
MCH: 29.9 pg (ref 26.6–33.0)
MCHC: 32.9 g/dL (ref 31.5–35.7)
MCV: 91 fL (ref 79–97)
Monocytes Absolute: 0.5 10*3/uL (ref 0.1–0.9)
Monocytes: 11 %
NEUTROS ABS: 2.7 10*3/uL (ref 1.4–7.0)
Neutrophils: 60 %
Platelets: 99 10*3/uL — CL (ref 150–379)
RBC: 4.35 x10E6/uL (ref 4.14–5.80)
RDW: 15.2 % (ref 12.3–15.4)
WBC: 4.4 10*3/uL (ref 3.4–10.8)

## 2017-04-15 LAB — BASIC METABOLIC PANEL
BUN/Creatinine Ratio: 21 — ABNORMAL HIGH (ref 9–20)
BUN: 19 mg/dL (ref 6–24)
CALCIUM: 8.8 mg/dL (ref 8.7–10.2)
CO2: 24 mmol/L (ref 20–29)
CREATININE: 0.89 mg/dL (ref 0.76–1.27)
Chloride: 103 mmol/L (ref 96–106)
GFR calc Af Amer: 108 mL/min/{1.73_m2} (ref >59)
GFR, EST NON AFRICAN AMERICAN: 94 mL/min/{1.73_m2} (ref >59)
Glucose: 162 mg/dL — ABNORMAL HIGH (ref 65–99)
Potassium: 4.9 mmol/L (ref 3.5–5.2)
Sodium: 139 mmol/L (ref 134–144)

## 2017-04-15 LAB — APTT: aPTT: 28 s (ref 24–33)

## 2017-04-15 LAB — PROTIME-INR
INR: 1.2 (ref 0.8–1.2)
PROTHROMBIN TIME: 11.9 s (ref 9.1–12.0)

## 2017-04-15 MED ORDER — OMEPRAZOLE 40 MG PO CPDR: 40.0000 mg | DELAYED_RELEASE_CAPSULE | Freq: Every day | ORAL | 1 refills | Status: DC

## 2017-04-15 NOTE — Telephone Encounter (Signed)
error 

## 2017-04-15 NOTE — Telephone Encounter (Signed)
Spoke to pt, informed him of time change for CATH on 3/25. Time changed from 1130 to 730. Pt to arrive at [facility] at 530 AM. Pt verbalized understanding and thanks.

## 2017-04-15 NOTE — Telephone Encounter (Signed)
New Message:    Barry [name] calling due to talking to [name] earlier about a schedule change for pt

## 2017-04-15 NOTE — Telephone Encounter (Signed)
[facility]. 

## 2017-04-18 ENCOUNTER — Ambulatory Visit (HOSPITAL_COMMUNITY)
Admission: RE | Admit: 2017-04-18 | Discharge: 2017-04-18 | Disposition: A | Discharge status: Home / Self Care | Payer: BLUE CROSS/BLUE SHIELD | Source: Ambulatory Visit | Attending: Cardiovascular Disease | Admitting: Cardiovascular Disease

## 2017-04-18 ENCOUNTER — Ambulatory Visit (HOSPITAL_COMMUNITY)
Admission: RE | Disposition: A | Discharge status: Home / Self Care | Payer: Self-pay | Source: Ambulatory Visit | Attending: Cardiovascular Disease

## 2017-04-18 DIAGNOSIS — R0600 Dyspnea, unspecified: Secondary | ICD-10-CM | POA: Diagnosis present

## 2017-04-18 DIAGNOSIS — R9439 Abnormal result of other cardiovascular function study: Secondary | ICD-10-CM | POA: Diagnosis not present

## 2017-04-18 DIAGNOSIS — I2510 Atherosclerotic heart disease of native coronary artery without angina pectoris: Secondary | ICD-10-CM | POA: Diagnosis not present

## 2017-04-18 DIAGNOSIS — Z951 Presence of aortocoronary bypass graft: Secondary | ICD-10-CM | POA: Diagnosis not present

## 2017-04-18 DIAGNOSIS — R0789 Other chest pain: Secondary | ICD-10-CM

## 2017-04-18 DIAGNOSIS — I252 Old myocardial infarction: Secondary | ICD-10-CM | POA: Diagnosis not present

## 2017-04-18 DIAGNOSIS — R0609 Other forms of dyspnea: Secondary | ICD-10-CM

## 2017-04-18 DIAGNOSIS — Z955 Presence of coronary angioplasty implant and graft: Secondary | ICD-10-CM | POA: Diagnosis not present

## 2017-04-18 DIAGNOSIS — I2582 Chronic total occlusion of coronary artery: Secondary | ICD-10-CM | POA: Insufficient documentation

## 2017-04-18 HISTORY — PX: LEFT HEART CATH AND CORS/GRAFTS ANGIOGRAPHY: CATH118250

## 2017-04-18 LAB — GLUCOSE, CAPILLARY
Glucose-Capillary: 154 mg/dL — ABNORMAL HIGH (ref 65–99)
Glucose-Capillary: 152 mg/dL — ABNORMAL HIGH (ref 65–99)

## 2017-04-18 SURGERY — LEFT HEART CATH AND CORS/GRAFTS ANGIOGRAPHY
Anesthesia: LOCAL

## 2017-04-18 MED ORDER — SODIUM CHLORIDE 0.9 % WEIGHT BASED INFUSION: 1.0000 mL/kg/h | INTRAVENOUS | Status: DC

## 2017-04-18 MED ORDER — IOPAMIDOL (ISOVUE-370) INJECTION 76%
INTRAVENOUS | Status: AC
Start: 2017-04-18 — End: ?
  Filled 2017-04-18: qty 50

## 2017-04-18 MED ORDER — ASPIRIN 81 MG PO CHEW
81.0000 mg | CHEWABLE_TABLET | ORAL | Status: AC
  Administered 2017-04-18: 81 mg via ORAL

## 2017-04-18 MED ORDER — SODIUM CHLORIDE 0.9 % IV SOLN: 250.0000 mL | INTRAVENOUS | Status: DC | PRN

## 2017-04-18 MED ORDER — ONDANSETRON HCL 4 MG/2ML IJ SOLN: 4.0000 mg | Freq: Four times a day (QID) | INTRAMUSCULAR | Status: DC | PRN

## 2017-04-18 MED ORDER — HEPARIN (PORCINE) IN NACL 2-0.9 UNIT/ML-% IJ SOLN
INTRAMUSCULAR | Status: AC
  Filled 2017-04-18: qty 1000

## 2017-04-18 MED ORDER — HEPARIN (PORCINE) IN NACL 2-0.9 UNIT/ML-% IJ SOLN
INTRAMUSCULAR | Status: AC | PRN
  Administered 2017-04-18 (×2): 500 mL via INTRA_ARTERIAL

## 2017-04-18 MED ORDER — ACETAMINOPHEN 325 MG PO TABS: 650.0000 mg | ORAL_TABLET | ORAL | Status: DC | PRN

## 2017-04-18 MED ORDER — CLOPIDOGREL BISULFATE 75 MG PO TABS
75.0000 mg | ORAL_TABLET | Freq: Once | ORAL | Status: AC
  Administered 2017-04-18: 75 mg via ORAL

## 2017-04-18 MED ORDER — MIDAZOLAM HCL 2 MG/2ML IJ SOLN
INTRAMUSCULAR | Status: DC | PRN
  Administered 2017-04-18 (×2): 1 mg via INTRAVENOUS

## 2017-04-18 MED ORDER — LIDOCAINE HCL 1 % IJ SOLN
INTRAMUSCULAR | Status: AC
  Filled 2017-04-18: qty 40

## 2017-04-18 MED ORDER — ASPIRIN 81 MG PO CHEW
CHEWABLE_TABLET | ORAL | Status: AC
  Administered 2017-04-18: 81 mg via ORAL
  Filled 2017-04-18: qty 1

## 2017-04-18 MED ORDER — SODIUM CHLORIDE 0.9% FLUSH: 3.0000 mL | Freq: Two times a day (BID) | INTRAVENOUS | Status: DC

## 2017-04-18 MED ORDER — IOPAMIDOL (ISOVUE-370) INJECTION 76%
INTRAVENOUS | Status: DC | PRN
  Administered 2017-04-18: 150 mL via INTRA_ARTERIAL

## 2017-04-18 MED ORDER — MORPHINE SULFATE (PF) 10 MG/ML IV SOLN: 2.0000 mg | INTRAVENOUS | Status: DC | PRN

## 2017-04-18 MED ORDER — HEPARIN (PORCINE) IN NACL 2-0.9 UNIT/ML-% IJ SOLN
INTRAMUSCULAR | Status: AC
  Filled 2017-04-18: qty 500

## 2017-04-18 MED ORDER — CLOPIDOGREL BISULFATE 75 MG PO TABS
ORAL_TABLET | ORAL | Status: AC
  Administered 2017-04-18: 75 mg via ORAL
  Filled 2017-04-18: qty 1

## 2017-04-18 MED ORDER — FENTANYL CITRATE (PF) 100 MCG/2ML IJ SOLN
INTRAMUSCULAR | Status: AC
  Filled 2017-04-18: qty 2

## 2017-04-18 MED ORDER — ATORVASTATIN CALCIUM 80 MG PO TABS: 80.0000 mg | ORAL_TABLET | Freq: Every day | ORAL | Status: DC

## 2017-04-18 MED ORDER — SODIUM CHLORIDE 0.9 % WEIGHT BASED INFUSION
3.0000 mL/kg/h | INTRAVENOUS | Status: AC
  Administered 2017-04-18: 3 mL/kg/h via INTRAVENOUS

## 2017-04-18 MED ORDER — SODIUM CHLORIDE 0.9 % IV SOLN: INTRAVENOUS | Status: AC

## 2017-04-18 MED ORDER — SODIUM CHLORIDE 0.9% FLUSH: 3.0000 mL | INTRAVENOUS | Status: DC | PRN

## 2017-04-18 MED ORDER — MORPHINE SULFATE (PF) 4 MG/ML IV SOLN
INTRAVENOUS | Status: AC
  Administered 2017-04-18: 2 mg
  Filled 2017-04-18: qty 1

## 2017-04-18 MED ORDER — IOPAMIDOL (ISOVUE-370) INJECTION 76%
INTRAVENOUS | Status: AC
  Filled 2017-04-18: qty 125

## 2017-04-18 MED ORDER — ASPIRIN 81 MG PO CHEW: 81.0000 mg | CHEWABLE_TABLET | Freq: Every day | ORAL | Status: DC

## 2017-04-18 MED ORDER — LIDOCAINE HCL 1 % IJ SOLN
INTRAMUSCULAR | Status: AC
  Filled 2017-04-18: qty 20

## 2017-04-18 MED ORDER — FENTANYL CITRATE (PF) 100 MCG/2ML IJ SOLN
INTRAMUSCULAR | Status: DC | PRN
  Administered 2017-04-18 (×2): 25 ug via INTRAVENOUS

## 2017-04-18 MED ORDER — MIDAZOLAM HCL 2 MG/2ML IJ SOLN
INTRAMUSCULAR | Status: AC
  Filled 2017-04-18: qty 2

## 2017-04-18 MED ORDER — LIDOCAINE HCL (PF) 1 % IJ SOLN
INTRAMUSCULAR | Status: DC | PRN
  Administered 2017-04-18: 50 mL via INTRADERMAL

## 2017-04-18 SURGICAL SUPPLY — 13 items
CATH INFINITI 5 FR MPA2 (CATHETERS) ×1 IMPLANT
CATH INFINITI 5 FR RCB (CATHETERS) ×1 IMPLANT
CATH INFINITI 5FR MULTPACK ANG (CATHETERS) ×1 IMPLANT
KIT HEART LEFT (KITS) ×2 IMPLANT
NDL SMART 18GX3.5CM LONG (NEEDLE) IMPLANT
NEEDLE SMART 18GX3.5CM LONG (NEEDLE) IMPLANT
PACK CARDIAC CATHETERIZATION (CUSTOM PROCEDURE TRAY) ×2 IMPLANT
SHEATH AVANTI 11CM 5FR (SHEATH) ×1 IMPLANT
SYR MEDRAD MARK V 150ML (SYRINGE) ×2 IMPLANT
TRANSDUCER W/STOPCOCK (MISCELLANEOUS) ×2 IMPLANT
WIRE EMERALD 3MM-J .035X150CM (WIRE) ×1 IMPLANT
WIRE EMERALD 3MM-J .035X260CM (WIRE) ×1 IMPLANT
WIRE HI TORQ VERSACORE-J 145CM (WIRE) ×1 IMPLANT

## 2017-04-18 NOTE — Progress Notes (Addendum)
Site area: RFA Site Prior to Removal:  Level 0 Pressure Applied For:20 min Manual:  yes  Patient Status During Pull:  stable Post Pull Site:  Level 0 Post Pull Instructions Given:  yes Post Pull Pulses Present: palpable Dressing Applied:  tegaderm Bedrest begins @ 0930 till 1330 Comments:

## 2017-04-18 NOTE — Interval H&P Note (Signed)
Cath Lab Visit (complete for each Cath Lab visit)  Clinical Evaluation Leading to the Procedure:   ACS: No.  Non-ACS:    Anginal Classification: CCS II  Anti-ischemic medical therapy: Minimal Therapy (1 class of medications)  Non-Invasive Test Results: Intermediate-risk stress test findings: cardiac mortality 1-3%/year  Prior CABG: Previous CABG      History and Physical Interval Note:  04/18/2017 7:41 AM  Barry [name]  has presented today for surgery, with the diagnosis of cp  The various methods of treatment have been discussed with the patient and family. After consideration of risks, benefits and other options for treatment, the patient has consented to  Procedure(s): LEFT HEART CATH AND CORS/GRAFTS ANGIOGRAPHY (N/A) as a surgical intervention .  The patient's history has been reviewed, patient examined, no change in status, stable for surgery.  I have reviewed the patient's chart and labs.  Questions were answered to the patient's satisfaction.     Nanetta Batty

## 2017-04-18 NOTE — Discharge Instructions (Signed)
NO METFORMIN/GLUCOPHAGE FOR 2 DAYS ° ° °Femoral Site Care °Refer to this sheet in the next few weeks. These instructions provide you with information about caring for yourself after your procedure. Your health care provider may also give you more specific instructions. Your treatment has been planned according to current medical practices, but problems sometimes occur. Call your health care provider if you have any problems or questions after your procedure. °What can I expect after the procedure? °After your procedure, it is typical to have the following: °· Bruising at the site that usually fades within 1-2 weeks. °· Blood collecting in the tissue (hematoma) that may be painful to the touch. It should usually decrease in size and tenderness within 1-2 weeks. ° °Follow these instructions at home: °· Take medicines only as directed by your health care provider. °· You may shower 24-48 hours after the procedure or as directed by your health care provider. Remove the bandage (dressing) and gently wash the site with plain soap and water. Pat the area dry with a clean towel. Do not rub the site, because this may cause bleeding. °· Do not take baths, swim, or use a hot tub until your health care provider approves. °· Check your insertion site every day for redness, swelling, or drainage. °· Do not apply powder or lotion to the site. °· Limit use of stairs to twice a day for the first 2-3 days or as directed by your health care provider. °· Do not squat for the first 2-3 days or as directed by your health care provider. °· Do not lift over 10 lb (4.5 kg) for 5 days after your procedure or as directed by your health care provider. °· Ask your health care provider when it is okay to: °? Return to work or school. °? Resume usual physical activities or sports. °? Resume sexual activity. °· Do not drive home if you are discharged the same day as the procedure. Have someone else drive you. °· You may drive 24 hours after the  procedure unless otherwise instructed by your health care provider. °· Do not operate machinery or power tools for 24 hours after the procedure or as directed by your health care provider. °· If your procedure was done as an outpatient procedure, which means that you went home the same day as your procedure, a responsible adult should be with you for the first 24 hours after you arrive home. °· Keep all follow-up visits as directed by your health care provider. This is important. °Contact a health care provider if: °· You have a fever. °· You have chills. °· You have increased bleeding from the site. Hold pressure on the site. °Get help right away if: °· You have unusual pain at the site. °· You have redness, warmth, or swelling at the site. °· You have drainage (other than a small amount of blood on the dressing) from the site. °· The site is bleeding, and the bleeding does not stop after 30 minutes of holding steady pressure on the site. °· Your leg or foot becomes pale, cool, tingly, or numb. °This information is not intended to replace advice given to you by your health care provider. Make sure you discuss any questions you have with your health care provider. °Document Released: 09/14/2013 Document Revised: 06/19/2015 Document Reviewed: 07/31/2013 °Elsevier Interactive Patient Education © 2018 [employer] Inc. ° °

## 2017-04-19 ENCOUNTER — Encounter (HOSPITAL_COMMUNITY): Payer: Self-pay | Admitting: Cardiovascular Disease

## 2017-04-19 MED FILL — Heparin Sodium (Porcine) 2 Unit/ML in Sodium Chloride 0.9%: INTRAMUSCULAR | Qty: 1000 | Status: AC

## 2017-04-19 MED FILL — Lidocaine HCl Local Inj 1%: INTRAMUSCULAR | Qty: 60 | Status: AC

## 2017-04-21 ENCOUNTER — Other Ambulatory Visit: Payer: Self-pay | Admitting: *Deleted

## 2017-04-21 ENCOUNTER — Other Ambulatory Visit: Payer: Self-pay | Admitting: Nurse Practitioner

## 2017-04-21 DIAGNOSIS — F418 Other specified anxiety disorders: Secondary | ICD-10-CM

## 2017-04-21 MED ORDER — BUSPIRONE HCL 15 MG PO TABS: ORAL_TABLET | ORAL | 1 refills | Status: DC

## 2017-04-21 MED ORDER — ALBUTEROL SULFATE HFA 108 (90 BASE) MCG/ACT IN AERS: 2.0000 | INHALATION_SPRAY | Freq: Four times a day (QID) | RESPIRATORY_TRACT | 3 refills | Status: DC | PRN

## 2017-04-21 NOTE — Telephone Encounter (Signed)
Patient requested Rx's to be sent to pharmacy. Faxed.

## 2017-05-03 ENCOUNTER — Ambulatory Visit (INDEPENDENT_AMBULATORY_CARE_PROVIDER_SITE_OTHER): Payer: BLUE CROSS/BLUE SHIELD | Admitting: Cardiovascular Disease

## 2017-05-03 ENCOUNTER — Encounter: Payer: Self-pay | Admitting: Cardiovascular Disease

## 2017-05-03 DIAGNOSIS — I10 Essential (primary) hypertension: Secondary | ICD-10-CM | POA: Diagnosis not present

## 2017-05-03 DIAGNOSIS — Z951 Presence of aortocoronary bypass graft: Secondary | ICD-10-CM | POA: Diagnosis not present

## 2017-05-03 DIAGNOSIS — E785 Hyperlipidemia, unspecified: Secondary | ICD-10-CM

## 2017-05-03 NOTE — Assessment & Plan Note (Signed)
History of hyperlipidemia on statin therapy with recent lipid profile performed 02/16/17 revealed a total cholesterol 113, triglyceride level of 89, HDL of 35.

## 2017-05-03 NOTE — Assessment & Plan Note (Signed)
History of essential hypertension blood pressure measured 122/74. He is on lisinopril and Lopressor. Continue current meds at current dosing.

## 2017-05-03 NOTE — Assessment & Plan Note (Signed)
Barry [name] returns for post hospital follow-up after his recent heart catheterization which I performed on 04/18/17. This was done because of dyspnea and atypical chest pain with a Myoview stress test that showed subtle inferior ischemia. He did have bypass grafting 3 by Dr. Cornelius Moras 07/04/12. This Showed an occluded RCA vein graft with occluded dominant RCA, left-to-right collaterals and normal LV function. His LVEDP was mildly elevated.I thought his anatomy was stable and recommended medical therapy.

## 2017-05-03 NOTE — Patient Instructions (Signed)

## 2017-05-03 NOTE — Progress Notes (Signed)
05/03/2017 Barry [name]   06-24-1955  [id number]  Primary Physician Gildardo Cranker, DO Primary Cardiologist: Lorretta Harp MD FACP, [place], [place], [place]  HPI:  Barry [name] is a 62 y.o.  with history of CAD s/p MI about 5 years ago I last saw him in the office 04/13/17. Marland Kitchen He underwent coronary artery bypass grafting x3 by Dr. Lilly Cove on 07/04/12 after his DAPT washed out. . A LIMA to his LAD, vein to diagonal branch and PDA. He did have a 2-D echo performed 03/29/14 which was entirely normal. He was placed on Integrilin in the interim. He did well on his has recuperated nicely.  since I saw him back in 2016 he has done well until 3 months ago when he developed increasing dyspnea on exertion and some atypical chest pain. He also complains of profound fatigue   He had a  2-D echocardiogram which was normal and a Myoview stress test that showed subtle inferior ischemia. Based on this, he underwent cardiac catheterization by myself as an outpatient on 04/18/17 revealing an occluded vein graft to the RCA, occluded dominant RCA with left-to-right collaterals and normal LV function.I decided to treat him medically.    Current Meds  Medication Sig  . albuterol (PROVENTIL HFA;VENTOLIN HFA) 108 (90 Base) MCG/ACT inhaler Inhale 2 puffs into the lungs every 6 (six) hours as needed for wheezing or shortness of breath.  Marland Kitchen aspirin 81 MG chewable tablet Chew 81 mg by mouth daily.   Marland Kitchen atorvastatin (LIPITOR) 80 MG tablet TAKE 1 TABLET BY MOUTH DAILY (Patient taking differently: TAKE 80 MG BY MOUTH DAILY AT BEDTIME)  . Blood Glucose Monitoring Suppl (CONTOUR NEXT EZ MONITOR) w/Device KIT Test blood sugar three times daily E11.22  . busPIRone (BUSPAR) 15 MG tablet Take one tablet by mouth three times daily for anxiety  . clopidogrel (PLAVIX) 75 MG tablet take 1 tablet by mouth once daily (Patient taking differently: Take 75 mg by mouth once daily)  . glucose blood (BAYER CONTOUR NEXT TEST) test strip Use as  instructed  . Insulin Glargine (TOUJEO SOLOSTAR) 300 UNIT/ML SOPN Inject 30 Units into the skin daily at 6 (six) AM.  . Insulin Pen Needle 32G X 4 MM MISC Use as Directed. Dx: E11.40  . lisinopril (PRINIVIL,ZESTRIL) 5 MG tablet TAKE 1 TABLET BY MOUTH DAILY (Patient taking differently: TAKE 5 MG BY MOUTH DAILY)  . meloxicam (MOBIC) 7.5 MG tablet Take 1 tablet (7.5 mg total) by mouth daily.  . metFORMIN (GLUCOPHAGE) 1000 MG tablet Take 1 tablet (1,000 mg total) by mouth 2 (two) times daily.  . metoprolol tartrate (LOPRESSOR) 25 MG tablet Take 0.5 tablets (12.5 mg total) by mouth 2 (two) times daily.  Marland Kitchen omeprazole (PRILOSEC) 40 MG capsule Take 1 capsule (40 mg total) by mouth daily.  Marland Kitchen oxyCODONE-acetaminophen (PERCOCET/ROXICET) 5-325 MG tablet Take 2 tablets by mouth in the morning, and Take 2 tablets by mouth in the evening (Patient taking differently: Take 2 tablets by mouth 2 (two) times daily. )  . sertraline (ZOLOFT) 100 MG tablet TAKE 1 AND 1/2 TABLETS BY MOUTH AT BEDTIME (Patient taking differently: Take 150 mg by mouth at bedtime. )  . sildenafil (VIAGRA) 100 MG tablet TAKE 1 TABLET BY MOUTH DAILY AS DIRECTED (Patient taking differently: Take 100 mg by mouth daily as needed for ED)  . thiamine (VITAMIN B-1) 100 MG tablet Take 100 mg by mouth daily.  Marland Kitchen zolpidem (AMBIEN) 10 MG tablet Take 1  tablet (10 mg total) by mouth at bedtime.     No Known Allergies  Social History   Socioeconomic History  . Marital status: Divorced    Spouse name: Not on file  . Number of children: 1  . Years of education: Not on file  . Highest education level: Not on file  Occupational History  . Occupation: MAINTENANCE    Employer: [employer]  Social Needs  . Financial resource strain: Not on file  . Food insecurity:    Worry: Not on file    Inability: Not on file  . Transportation needs:    Medical: Not on file    Non-medical: Not on file  Tobacco Use  . Smoking status: Never Smoker  .  Smokeless tobacco: Never Used  Substance and Sexual Activity  . Alcohol use: No    Alcohol/week: 0.0 oz  . Drug use: No  . Sexual activity: Not on file  Lifestyle  . Physical activity:    Days per week: Not on file    Minutes per session: Not on file  . Stress: Not on file  Relationships  . Social connections:    Talks on phone: Not on file    Gets together: Not on file    Attends religious service: Not on file    Active member of club or organization: Not on file    Attends meetings of clubs or organizations: Not on file    Relationship status: Not on file  . Intimate partner violence:    Fear of current or ex partner: Not on file    Emotionally abused: Not on file    Physically abused: Not on file    Forced sexual activity: Not on file  Other Topics Concern  . Not on file  Social History Narrative   Lives alone in a one story home.  Has one daughter.  Works as a Air traffic controller.  Education: high school.      Review of Systems: General: negative for chills, fever, night sweats or weight changes.  Cardiovascular: negative for chest pain, dyspnea on exertion, edema, orthopnea, palpitations, paroxysmal nocturnal dyspnea or shortness of breath Dermatological: negative for rash Respiratory: negative for cough or wheezing Urologic: negative for hematuria Abdominal: negative for nausea, vomiting, diarrhea, bright red blood per rectum, melena, or hematemesis Neurologic: negative for visual changes, syncope, or dizziness All other systems reviewed and are otherwise negative except as noted above.    Blood pressure 122/74, pulse (!) 58, height _0  (1.676 m), weight 262 lb (118.8 kg).  General appearance: alert and no distress Neck: no adenopathy, no carotid bruit, no JVD, supple, symmetrical, trachea midline and thyroid not enlarged, symmetric, no tenderness/mass/nodules Lungs: clear to auscultation bilaterally Heart: regular rate and rhythm, S1, S2 normal, no murmur,  click, rub or gallop Extremities: extremities normal, atraumatic, no cyanosis or edema Pulses: 2+ and symmetric Skin: Skin color, texture, turgor normal. No rashes or lesions Neurologic: Alert and oriented X 3, normal strength and tone. Normal symmetric reflexes. Normal coordination and gait  EKG not performed today  ASSESSMENT AND PLAN:   S/P CABG x 3 Mr. Rath returns for post hospital follow-up after his recent heart catheterization which I performed on 04/18/17. This was done because of dyspnea and atypical chest pain with a Myoview stress test that showed subtle inferior ischemia. He did have bypass grafting 3 by Dr. Roxy Manns 07/04/12. This Showed an occluded RCA vein graft with occluded dominant RCA, left-to-right collaterals and normal LV  function. His LVEDP was mildly elevated.I thought his anatomy was stable and recommended medical therapy.  Essential hypertension, benign History of essential hypertension blood pressure measured 122/74. He is on lisinopril and Lopressor. Continue current meds at current dosing.  Hyperlipidemia LDL goal <70 History of hyperlipidemia on statin therapy with recent lipid profile performed 02/16/17 revealed a total cholesterol 113, triglyceride level of 89, HDL of 35.      Lorretta Harp MD FACP,FACC,FAHA, [facility] 05/03/2017 11:54 AM

## 2017-05-06 ENCOUNTER — Encounter: Payer: BLUE CROSS/BLUE SHIELD | Admitting: Gastroenterology

## 2017-05-09 ENCOUNTER — Other Ambulatory Visit: Payer: Self-pay | Admitting: *Deleted

## 2017-05-09 MED ORDER — CLOPIDOGREL BISULFATE 75 MG PO TABS: ORAL_TABLET | ORAL | 1 refills | Status: DC

## 2017-05-09 MED ORDER — OXYCODONE-ACETAMINOPHEN 5-325 MG PO TABS: ORAL_TABLET | ORAL | 0 refills | Status: DC

## 2017-05-09 NOTE — Telephone Encounter (Signed)
Patient requested Narcotic Refill NCCSRS Database Verified LR: 03/29/2017 Pharmacy Confirmed Pended Rx and sent to Dr. Montez Morita for approval.

## 2017-05-10 ENCOUNTER — Other Ambulatory Visit: Payer: Self-pay | Admitting: Internal Medicine

## 2017-05-18 ENCOUNTER — Encounter: Payer: Self-pay | Admitting: Internal Medicine

## 2017-05-18 ENCOUNTER — Other Ambulatory Visit: Payer: Self-pay

## 2017-05-18 ENCOUNTER — Ambulatory Visit: Payer: BLUE CROSS/BLUE SHIELD | Admitting: Internal Medicine

## 2017-05-18 VITALS — BP 130/80 | HR 54 | Temp 98.5°F | Resp 10 | Ht 66.0 in | Wt 260.0 lb

## 2017-05-18 DIAGNOSIS — F418 Other specified anxiety disorders: Secondary | ICD-10-CM | POA: Diagnosis not present

## 2017-05-18 DIAGNOSIS — G8929 Other chronic pain: Secondary | ICD-10-CM

## 2017-05-18 DIAGNOSIS — E785 Hyperlipidemia, unspecified: Secondary | ICD-10-CM

## 2017-05-18 DIAGNOSIS — G4700 Insomnia, unspecified: Secondary | ICD-10-CM

## 2017-05-18 DIAGNOSIS — E1140 Type 2 diabetes mellitus with diabetic neuropathy, unspecified: Secondary | ICD-10-CM | POA: Diagnosis not present

## 2017-05-18 DIAGNOSIS — IMO0002 Reserved for concepts with insufficient information to code with codable children: Secondary | ICD-10-CM

## 2017-05-18 DIAGNOSIS — Z794 Long term (current) use of insulin: Principal | ICD-10-CM

## 2017-05-18 DIAGNOSIS — M25512 Pain in left shoulder: Secondary | ICD-10-CM

## 2017-05-18 DIAGNOSIS — E1122 Type 2 diabetes mellitus with diabetic chronic kidney disease: Secondary | ICD-10-CM

## 2017-05-18 DIAGNOSIS — E1165 Type 2 diabetes mellitus with hyperglycemia: Principal | ICD-10-CM

## 2017-05-18 DIAGNOSIS — I10 Essential (primary) hypertension: Secondary | ICD-10-CM | POA: Diagnosis not present

## 2017-05-18 DIAGNOSIS — M25511 Pain in right shoulder: Secondary | ICD-10-CM

## 2017-05-18 DIAGNOSIS — E1169 Type 2 diabetes mellitus with other specified complication: Secondary | ICD-10-CM | POA: Diagnosis not present

## 2017-05-18 MED ORDER — INSULIN PEN NEEDLE 32G X 4 MM MISC: 11 refills | Status: DC

## 2017-05-18 MED ORDER — METOPROLOL TARTRATE 25 MG PO TABS: 12.5000 mg | ORAL_TABLET | Freq: Two times a day (BID) | ORAL | 1 refills | Status: DC

## 2017-05-18 NOTE — Patient Instructions (Addendum)
Return to office for fasting labs  Continue current medications as ordered  Will call with Orthopedic referral for shoulder pain  Continue CPAP at bedtime  Follow up with cardiology as scheduled  Follow up in 3 mos for DM, HTN, hyperlipidemia, depression/anxiety

## 2017-05-18 NOTE — Progress Notes (Signed)
Patient ID: Barry [name], male   DOB: [birth year], 62 y.o.   MRN: [id number]   Location:  [facility] OFFICE  Provider: DR Arletha Grippe  Code Status:  Goals of Care:  Advanced Directives 05/18/2017  Does Patient Have a Medical Advance Directive? No  Type of Advance Directive -  [place] in Chart? -  Would patient like information on creating a medical advance directive? -  Pre-existing out of facility DNR order (yellow form or pink MOST form) -     Chief Complaint  Patient presents with  . Medical Management of Chronic Issues    3 month follow-up on DM, HTN, joint pain and Hyperlipidemia. Discuss Ambien, ineffective. Patient not fasting today will come for labs tonmorrow   . Medication Refill    Metoprolol #90 and Pen Needles [facility]   . Advance Care planning    No ACP on file, patient has paperwork at home     HPI: Patient is a 62 y.o. male seen today for medical management of chronic diseases.  He saw cardio Dr Gwenlyn Found and had cardiac cath 04/18/17 (due to abnormal myoview stress test) that revealed occluded RCA vein graft with L-->R collaterals, a high grade in-stent restenotic lesion within previous stented segment that supplies very small area of PDA. Medical therapy recommended. EF 55-60% by 2D echo in March 2019. He continues to struggle with pain in right shoulder that awakens him from sleep. ambien not effective in sleep. He did see neurology Dr Posey Pronto for neuropathy. NCS/EMG revealed b/l UE polyneuropathy c/w diabetic neuropathy. B1 level low and was started on B supplement. MRI L spine recommended due to increased LE reflexes and to r/o spinal stenosis but pt declined  Depression with anxiety/insomnia - mood stable on sertraline 135m qhs; buspar TID. He is not sleeping well due to pain.  HTN - BP controlled on lisinopril and lopressor. He takes ASA daily  DM - uncontrolled but improving. BS checked fasting fluctuating. Occasional low BS reactions   (weak/dizziness/shaking) relieved with eating and/or drinking. He takes toujeo 30 units daily and metformin. No longer on farxiga 2/2 cost. A1c 9.1%. Urine microalbumin/Cr ratio 60. He is followed by CTivis Ringer LDL at goal on statin. He takes ACEI  Hyperlipidemia - stable on lipitor. LDL 61; HDL 35  Chronic pain/Knee pain - uncontrolled on percocet 4 tabs daily. He has b/l knee pain. He has pain in his ankles at night when he tries to cross them. He states "my whole body hurts"; right shoulder and hand very painful and "feels like it will break". He has seen ortho in the past and rec'd injections into knees but now shoulders R>L really hurting  CAD - s/p CABG x 3 vessels. stable.  No CP. Occasional SOB. Takes BB, ACEI and plavix. He has not seen Cardio Dr BGwenlyn Foundfor f/u since 2016  Sleep apnea - wears CPAP qhs now.  He gets a rash on face due to mask. Excessive daytime sleepiness has improved  Morbid obesity - actively dieting. Weight fluctuates. BMI 41.99 (slightly worse)  Elevated liver enzymes - worse. ALT 61 (prev 53); AST 56 (prev 45)  GERD - sx's controlled on omeprazole.   Low platelets - etiology unknown. Plts 99K  Past Medical History:  Diagnosis Date  . Coronary artery disease   . MI [place]ocardial infarction) [place] [place]   inferior wall  . [place] 06/26/2012  . S/P CABG x 3 07/04/2012   LIMA to  LAD, SVG to D1, SVG to PDA, EVH via right thigh  . Sleep apnea   . Type II or unspecified type diabetes mellitus without mention of complication, not stated as uncontrolled   . Unspecified essential hypertension     Past Surgical History:  Procedure Laterality Date  . CORONARY ANGIOPLASTY WITH STENT PLACEMENT  04/23/2007   PCI and stenting of mid RCA - Dr Donnetta Hutching @ [facility]  . CORONARY ARTERY BYPASS GRAFT N/A 07/04/2012   Procedure: CORONARY ARTERY BYPASS GRAFTING (CABG);  Surgeon: Rexene Alberts, MD;  Location: [facility];  Service: Open Heart Surgery;  Laterality: N/A;  x3  using right greater saphenous vein and left internal mammary.   . INTRAOPERATIVE TRANSESOPHAGEAL ECHOCARDIOGRAM N/A 07/04/2012   Procedure: INTRAOPERATIVE TRANSESOPHAGEAL ECHOCARDIOGRAM;  Surgeon: Rexene Alberts, MD;  Location: [facility];  Service: Open Heart Surgery;  Laterality: N/A;  . LEFT HEART CATH AND CORS/GRAFTS ANGIOGRAPHY N/A 04/18/2017   Procedure: LEFT HEART CATH AND CORS/GRAFTS ANGIOGRAPHY;  Surgeon: Lorretta Harp, MD;  Location: [facility];  Service: Cardiovascular;  Laterality: N/A;  . LEFT HEART CATHETERIZATION WITH CORONARY ANGIOGRAM N/A 06/25/2012   Procedure: LEFT HEART CATHETERIZATION WITH CORONARY ANGIOGRAM;  Surgeon: Lorretta Harp, MD;  Location: [facility] CATH LAB;  Service: Cardiovascular;  Laterality: N/A;     reports that he has never smoked. He has never used smokeless tobacco. He reports that he does not drink alcohol or use drugs. Social History   Socioeconomic History  . Marital status: Divorced    Spouse name: Not on file  . Number of children: 1  . Years of education: Not on file  . Highest education level: Not on file  Occupational History  . Occupation: MAINTENANCE    Employer: [employer]  Social Needs  . Financial resource strain: Not on file  . Food insecurity:    Worry: Not on file    Inability: Not on file  . Transportation needs:    Medical: Not on file    Non-medical: Not on file  Tobacco Use  . Smoking status: Never Smoker  . Smokeless tobacco: Never Used  Substance and Sexual Activity  . Alcohol use: No    Alcohol/week: 0.0 oz  . Drug use: No  . Sexual activity: Not on file  Lifestyle  . Physical activity:    Days per week: Not on file    Minutes per session: Not on file  . Stress: Not on file  Relationships  . Social connections:    Talks on phone: Not on file    Gets together: Not on file    Attends religious service: Not on file    Active member of club or organization: Not on file    Attends meetings of clubs or  organizations: Not on file    Relationship status: Not on file  . Intimate partner violence:    Fear of current or ex partner: Not on file    Emotionally abused: Not on file    Physically abused: Not on file    Forced sexual activity: Not on file  Other Topics Concern  . Not on file  Social History Narrative   Lives alone in a one story home.  Has one daughter.  Works as a Air traffic controller.  Education: high school.     Family History  Problem Relation Age of Onset  . Cancer Mother   . Diabetes Sister   . Diabetes Brother     No Known Allergies  Outpatient Encounter Medications as of 05/18/2017  Medication Sig  . albuterol (PROVENTIL HFA;VENTOLIN HFA) 108 (90 Base) MCG/ACT inhaler Inhale 2 puffs into the lungs every 6 (six) hours as needed for wheezing or shortness of breath.  Marland Kitchen aspirin 81 MG chewable tablet Chew 81 mg by mouth daily.   Marland Kitchen atorvastatin (LIPITOR) 80 MG tablet TAKE 1 TABLET BY MOUTH DAILY  . Blood Glucose Monitoring Suppl (CONTOUR NEXT EZ MONITOR) w/Device KIT Test blood sugar three times daily E11.22  . busPIRone (BUSPAR) 15 MG tablet Take one tablet by mouth three times daily for anxiety  . clopidogrel (PLAVIX) 75 MG tablet Take one tablet by mouth once daily  . glucose blood (BAYER CONTOUR NEXT TEST) test strip Use as instructed  . Insulin Glargine (TOUJEO SOLOSTAR) 300 UNIT/ML SOPN Inject 30 Units into the skin daily at 6 (six) AM.  . Insulin Pen Needle 32G X 4 MM MISC Use as Directed. Dx: E11.40  . lisinopril (PRINIVIL,ZESTRIL) 5 MG tablet TAKE 1 TABLET BY MOUTH DAILY  . meloxicam (MOBIC) 7.5 MG tablet Take 1 tablet (7.5 mg total) by mouth daily.  . metFORMIN (GLUCOPHAGE) 1000 MG tablet Take 1 tablet (1,000 mg total) by mouth 2 (two) times daily.  . metoprolol tartrate (LOPRESSOR) 25 MG tablet Take 0.5 tablets (12.5 mg total) by mouth 2 (two) times daily.  Marland Kitchen omeprazole (PRILOSEC) 40 MG capsule Take 1 capsule (40 mg total) by mouth daily.  Marland Kitchen  oxyCODONE-acetaminophen (PERCOCET/ROXICET) 5-325 MG tablet Take 2 tablets by mouth in the morning, and Take 2 tablets by mouth in the evening  . sertraline (ZOLOFT) 100 MG tablet TAKE 1 AND 1/2 TABLETS BY MOUTH AT BEDTIME  . sildenafil (VIAGRA) 100 MG tablet TAKE 1 TABLET BY MOUTH DAILY AS DIRECTED  . thiamine (VITAMIN B-1) 100 MG tablet Take 100 mg by mouth daily.  Marland Kitchen zolpidem (AMBIEN) 10 MG tablet Take 1 tablet (10 mg total) by mouth at bedtime.   No facility-administered encounter medications on file as of 05/18/2017.     Review of Systems:  Review of Systems  Neurological: Positive for numbness.  Psychiatric/Behavioral: Positive for sleep disturbance.  All other systems reviewed and are negative.   Health Maintenance  Topic Date Due  . HIV Screening  02/16/2018 (Originally 04/27/1970)  . Hepatitis C Screening  01/26/2023 (Originally 05/11/1955)  . HEMOGLOBIN A1C  08/16/2017  . INFLUENZA VACCINE  08/25/2017  . PNEUMOCOCCAL POLYSACCHARIDE VACCINE (2) 09/05/2017  . OPHTHALMOLOGY EXAM  11/13/2017  . FOOT EXAM  11/16/2017  . COLONOSCOPY  11/22/2023  . TETANUS/TDAP  05/07/2024    Physical Exam: Vitals:   05/18/17 0926  BP: 130/80  Pulse: (!) 54  Resp: 10  Temp: 98.5 F (36.9 C)  TempSrc: Oral  SpO2: 96%  Weight: 260 lb (117.9 kg)  Height: '5\' 6"'  (1.676 m)   Body mass index is 41.97 kg/m. Physical Exam  Constitutional: He is oriented to person, place, and time. He appears well-developed and well-nourished.  HENT:  Mouth/Throat: Oropharynx is clear and moist.  MMM; no oral thrush  Eyes: Pupils are equal, round, and reactive to light. No scleral icterus.  Neck: Neck supple. Carotid bruit is not present. No thyromegaly present.  Cardiovascular: Regular rhythm and intact distal pulses. Bradycardia present. Exam reveals no gallop and no friction rub.  Murmur heard.  Systolic murmur is present with a grade of 1/6. no distal LE swelling. No calf TTP  Pulmonary/Chest: Effort  normal and breath sounds normal. He has no wheezes.  He has no rales. He exhibits no tenderness.  Abdominal: Soft. Bowel sounds are normal. He exhibits no distension, no abdominal bruit, no pulsatile midline mass and no mass. There is no hepatomegaly. There is no tenderness. There is no rebound and no guarding.  obese  Musculoskeletal: He exhibits edema and tenderness.  R>L shoulder with crepitus on internal rotation; ROM reduced in R>L shoulder  Lymphadenopathy:    He has no cervical adenopathy.  Neurological: He is alert and oriented to person, place, and time. He has normal reflexes.  Skin: Skin is warm and dry. No rash noted.  Psychiatric: He has a normal mood and affect. His behavior is normal. Judgment and thought content normal.    Labs reviewed: Basic Metabolic Panel: Recent Labs    11/22/16 0927 02/16/17 0925 04/14/17 0824  NA 136  --  139  K 4.7  --  4.9  CL 100  --  103  CO2 27  --  24  GLUCOSE 291*  --  162*  BUN 16  --  19  CREATININE 0.96  --  0.89  CALCIUM 9.2  --  8.8  TSH 1.48 1.63  --    Liver Function Tests: Recent Labs    11/22/16 0927 03/18/17 1332  AST 56*  --   ALT 61*  --   BILITOT 0.6  --   PROT 7.7 7.3   No results for input(s): LIPASE, AMYLASE in the last 8760 hours. No results for input(s): AMMONIA in the last 8760 hours. CBC: Recent Labs    02/16/17 0925 03/21/17 0859 04/14/17 0824  WBC 5.7 6.8 4.4  NEUTROABS 3,853 4,474 2.7  HGB 13.5 13.1* 13.0  HCT 41.0 39.9 39.5  MCV 90.1 90.3 91  PLT 116* 111* 99*   Lipid Panel: Recent Labs    11/22/16 0927 02/16/17 0925  CHOL 125 113  HDL 38* 35*  LDLCALC 69 61  TRIG 101 89  CHOLHDL 3.3 3.2   Lab Results  Component Value Date   HGBA1C 9.1 (H) 02/16/2017    Procedures since last visit: No results found.  Assessment/Plan   ICD-10-CM   1. Chronic pain of both shoulders M25.511 Ambulatory referral to Orthopedic Surgery   G89.29    M25.512    worsening  2. Insomnia, unspecified  type G47.00    failing to change as expected  3. Essential hypertension, benign I10 metoprolol tartrate (LOPRESSOR) 25 MG tablet  4. Type 2 diabetes mellitus with diabetic neuropathy, with long-term current use of insulin (HCC) E11.40 Hemoglobin A1c   Z79.4 Lipid Panel    ALT  5. Depression with anxiety F41.8   6. Hyperlipidemia associated with type 2 diabetes mellitus ([place]) E11.69    E78.5     Return to office for fasting labs  Continue current medications as ordered  Will call with Orthopedic referral for shoulder pain  Continue CPAP at bedtime  Follow up with cardiology as scheduled  Follow up in 3 mos for DM, HTN, hyperlipidemia, depression/anxiety   Kaytlan Behrman S. Perlie Gold  [facility] [address] [place], [place] [address] [phone] Cell (Monday-Friday 8 AM - 5 PM) [phone] After 5 PM and follow prompts

## 2017-05-19 ENCOUNTER — Other Ambulatory Visit: Payer: BLUE CROSS/BLUE SHIELD

## 2017-05-19 DIAGNOSIS — Z794 Long term (current) use of insulin: Principal | ICD-10-CM

## 2017-05-19 DIAGNOSIS — E1140 Type 2 diabetes mellitus with diabetic neuropathy, unspecified: Secondary | ICD-10-CM

## 2017-05-19 LAB — COMPLETE METABOLIC PANEL WITH GFR
AG Ratio: 1.1 (calc) (ref 1.0–2.5)
ALT: 63 U/L — ABNORMAL HIGH (ref 9–46)
AST: 65 U/L — ABNORMAL HIGH (ref 10–35)
Albumin: 4 g/dL (ref 3.6–5.1)
Alkaline phosphatase (APISO): 98 U/L (ref 40–115)
BUN: 13 mg/dL (ref 7–25)
CO2: 30 mmol/L (ref 20–32)
Calcium: 9.6 mg/dL (ref 8.6–10.3)
Chloride: 101 mmol/L (ref 98–110)
Creat: 0.88 mg/dL (ref 0.70–1.25)
GFR, Est African American: 108 mL/min/{1.73_m2} (ref >=60)
GFR, Est Non African American: 93 mL/min/{1.73_m2} (ref >=60)
Globulin: 3.5 g/dL (calc) (ref 1.9–3.7)
Glucose, Bld: 170 mg/dL — ABNORMAL HIGH (ref 65–99)
Potassium: 4.8 mmol/L (ref 3.5–5.3)
Sodium: 136 mmol/L (ref 135–146)
Total Bilirubin: 0.7 mg/dL (ref 0.2–1.2)
Total Protein: 7.5 g/dL (ref 6.1–8.1)

## 2017-05-19 LAB — LIPID PANEL
CHOL/HDL RATIO: 3 (calc) (ref <5.0)
CHOLESTEROL: 113 mg/dL (ref <200)
HDL: 38 mg/dL — ABNORMAL LOW (ref >40)
LDL Cholesterol (Calc): 59 mg/dL (calc)
Non-HDL Cholesterol (Calc): 75 mg/dL (calc) (ref <130)
Triglycerides: 75 mg/dL (ref <150)

## 2017-05-19 LAB — ALT: ALT: 63 U/L — ABNORMAL HIGH (ref 9–46)

## 2017-05-20 LAB — HEMOGLOBIN A1C
EAG (MMOL/L): 10.8 (calc)
Hgb A1c MFr Bld: 8.4 % of total Hgb — ABNORMAL HIGH (ref <5.7)
MEAN PLASMA GLUCOSE: 194 (calc)

## 2017-05-23 ENCOUNTER — Ambulatory Visit (INDEPENDENT_AMBULATORY_CARE_PROVIDER_SITE_OTHER): Payer: BLUE CROSS/BLUE SHIELD | Admitting: Pharmacotherapy

## 2017-05-23 ENCOUNTER — Encounter: Payer: Self-pay | Admitting: Pharmacotherapy

## 2017-05-23 VITALS — BP 138/80 | HR 58 | Temp 98.1°F | Resp 10 | Ht 66.0 in | Wt 254.0 lb

## 2017-05-23 DIAGNOSIS — E1140 Type 2 diabetes mellitus with diabetic neuropathy, unspecified: Secondary | ICD-10-CM

## 2017-05-23 DIAGNOSIS — Z794 Long term (current) use of insulin: Secondary | ICD-10-CM | POA: Diagnosis not present

## 2017-05-23 MED ORDER — INSULIN GLARGINE 300 UNIT/ML ~~LOC~~ SOPN: 36.0000 [IU] | PEN_INJECTOR | Freq: Every day | SUBCUTANEOUS | 5 refills | Status: DC

## 2017-05-23 NOTE — Patient Instructions (Signed)
Increase Toujeo 36 units daily  Please be sure to have a follow up with Dr Montez Morita scheduled in 3 months

## 2017-05-23 NOTE — Progress Notes (Signed)
Subjective:    Barry [name] is a 62 y.o.white male who presents for follow-up of Type 2 diabetes mellitus.  Current symptoms/problems include acute neuropathy in the left foot, polyuria, and polyphagia but denies polydipsia. Symptoms have been present for 3 months.  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 no  Review of Systems Pertinent items are noted in HPI.    Objective:    BP 138/80   Pulse (!) 58   Temp 98.1 F (36.7 C) (Oral)   Resp 10   Ht 5' 6"$  (1.676 m)   Wt 254 lb (115.2 kg)   SpO2 94%   BMI 41.00 kg/m   General:  alert, cooperative, appears stated age and no distress                          Extremities: extremities normal, atraumatic, no cyanosis or edema  Skin: warm and dry, no hyperpigmentation, vitiligo, or suspicious lesions     Neuro: normal without focal findings, mental status, speech normal, alert and oriented x3, PERLA and reflexes normal and symmetric   Lab Review Glucose (mg/dL)  Date Value  04/14/2017 162 (H)   Glucose, Bld (mg/dL)  Date Value  05/19/2017 170 (H)  11/22/2016 291 (H)  03/24/2016 139 (H)   CO2 (mmol/L)  Date Value  05/19/2017 30  04/14/2017 24  11/22/2016 27   BUN (mg/dL)   Date Value  05/19/2017 13  04/14/2017 19  11/22/2016 16  03/24/2016 13  06/18/2015 17  05/30/2015 20   Creat (mg/dL)  Date Value  05/19/2017 0.88  11/22/2016 0.96  03/24/2016 0.90   Creatinine, Ser (mg/dL)  Date Value  04/14/2017 0.89   Assessment:     Diabetes Mellitus type II, under fair control. Fasting blood sugars are ranging above goal of <130 mg/dL around 160 mg/dL. Post-prandial blood sugars are frequently over 200 mg/dL, having trouble resisting carbohydrate-rich foods. Working long hours and battling fatigue. He is having trouble motivating himself to make healthy food choices and exercise.    Plan:    1.  Rx changes: Increase Toujeo to 36 units daily.  Continue to take Metformin as prescribed. 2.  Education: Reviewed 'ABCs' of diabetes management (respective goals in parentheses):  A1C (<7), blood pressure (<130/80), and cholesterol (LDL <100). Patient was educated that exercising and staying compliant with his insulin will decrease his blood sugars and improve his fatigue. The plate method was explained, and healthy snack options were provided.  3.  Compliance at present is estimated to be good. Efforts to improve compliance (if necessary) will be directed at dietary modifications: decrease starchy food portion sizes and make healthier choices when sweet tooth cravings arise at night, such as fruit with protein, increased  exercise and regular blood sugar monitoring: 1-2 times daily. 4. Follow up: 3 months   5. BP close to goal <130/80.  Will continue current RX and monitor.

## 2017-05-24 ENCOUNTER — Other Ambulatory Visit: Payer: Self-pay | Admitting: Internal Medicine

## 2017-06-10 ENCOUNTER — Encounter: Payer: Self-pay | Admitting: Internal Medicine

## 2017-06-11 ENCOUNTER — Other Ambulatory Visit: Payer: Self-pay | Admitting: Internal Medicine

## 2017-06-11 DIAGNOSIS — Z794 Long term (current) use of insulin: Principal | ICD-10-CM

## 2017-06-11 DIAGNOSIS — E1140 Type 2 diabetes mellitus with diabetic neuropathy, unspecified: Secondary | ICD-10-CM

## 2017-06-16 ENCOUNTER — Other Ambulatory Visit: Payer: Self-pay

## 2017-06-16 DIAGNOSIS — K219 Gastro-esophageal reflux disease without esophagitis: Secondary | ICD-10-CM

## 2017-06-16 DIAGNOSIS — F418 Other specified anxiety disorders: Secondary | ICD-10-CM

## 2017-06-16 MED ORDER — LISINOPRIL 5 MG PO TABS: 5.0000 mg | ORAL_TABLET | Freq: Every day | ORAL | 1 refills | Status: DC

## 2017-06-16 MED ORDER — SILDENAFIL CITRATE 100 MG PO TABS: 100.0000 mg | ORAL_TABLET | Freq: Every day | ORAL | 2 refills | Status: DC

## 2017-06-16 MED ORDER — SERTRALINE HCL 100 MG PO TABS: ORAL_TABLET | ORAL | 1 refills | Status: DC

## 2017-06-16 MED ORDER — ATORVASTATIN CALCIUM 80 MG PO TABS: 80.0000 mg | ORAL_TABLET | Freq: Every day | ORAL | 1 refills | Status: DC

## 2017-06-16 MED ORDER — OXYCODONE-ACETAMINOPHEN 5-325 MG PO TABS: ORAL_TABLET | ORAL | 0 refills | Status: DC

## 2017-06-16 MED ORDER — OMEPRAZOLE 40 MG PO CPDR
40.0000 mg | DELAYED_RELEASE_CAPSULE | Freq: Every day | ORAL | 1 refills | Status: DC
Start: 2017-06-16 — End: 2018-12-25

## 2017-06-16 NOTE — Telephone Encounter (Signed)
Patient called to request several refills on his medications. He asked for 90 day supply of omeprazole 40 mg, lipitor 80 mg, and lisinopril 5 mg. Pt also requested refills on sildenafil, oxycodone and zoloft.   Rx was pended to provider for approval  after verifying last fill date, provider, and quantity on [place]MP [place] database

## 2017-06-17 ENCOUNTER — Ambulatory Visit: Payer: BLUE CROSS/BLUE SHIELD | Admitting: Neurology

## 2017-06-17 ENCOUNTER — Encounter

## 2017-07-11 ENCOUNTER — Other Ambulatory Visit: Payer: Self-pay | Admitting: *Deleted

## 2017-07-11 DIAGNOSIS — G4700 Insomnia, unspecified: Secondary | ICD-10-CM

## 2017-07-11 MED ORDER — ZOLPIDEM TARTRATE 10 MG PO TABS: 10.0000 mg | ORAL_TABLET | Freq: Every day | ORAL | 0 refills | Status: DC

## 2017-07-11 NOTE — Telephone Encounter (Signed)
[facility]

## 2017-07-19 ENCOUNTER — Other Ambulatory Visit: Payer: Self-pay

## 2017-07-19 ENCOUNTER — Other Ambulatory Visit: Payer: Self-pay | Admitting: Internal Medicine

## 2017-07-19 DIAGNOSIS — F418 Other specified anxiety disorders: Secondary | ICD-10-CM

## 2017-07-19 MED ORDER — OXYCODONE-ACETAMINOPHEN 5-325 MG PO TABS: ORAL_TABLET | ORAL | 0 refills | Status: DC

## 2017-07-19 NOTE — Telephone Encounter (Signed)
Patient aware rx sent to pharmacy.  

## 2017-07-19 NOTE — Telephone Encounter (Signed)
[place] Database verified and compliance confirmed   Last filled 06/16/2017

## 2017-08-08 ENCOUNTER — Telehealth: Payer: Self-pay

## 2017-08-08 NOTE — Telephone Encounter (Signed)
I spoke with patient and he verbalized understanding. He stated that he would go to urgent care if symptoms got any worse.

## 2017-08-08 NOTE — Telephone Encounter (Signed)
I left a message for patient to call the office 

## 2017-08-08 NOTE — Telephone Encounter (Signed)
He needs to be seen prior to any antibiotic consideration

## 2017-08-08 NOTE — Telephone Encounter (Signed)
Patient called to see if provider would be willing to call in an antibiotic for possible sinus infection and ear infection since there are no appointments available. Patient states that he has had a severe headache, sinus pressure/congestion, and throbbing ear pain since late last week.   Please advise.

## 2017-08-17 ENCOUNTER — Ambulatory Visit: Payer: BLUE CROSS/BLUE SHIELD | Admitting: Internal Medicine

## 2017-08-23 ENCOUNTER — Ambulatory Visit (INDEPENDENT_AMBULATORY_CARE_PROVIDER_SITE_OTHER): Payer: BLUE CROSS/BLUE SHIELD | Admitting: Internal Medicine

## 2017-08-23 ENCOUNTER — Encounter: Payer: Self-pay | Admitting: Internal Medicine

## 2017-08-23 ENCOUNTER — Ambulatory Visit: Payer: BLUE CROSS/BLUE SHIELD | Admitting: Internal Medicine

## 2017-08-23 VITALS — BP 122/74 | HR 68 | Temp 98.1°F | Resp 20 | Ht 66.0 in | Wt 262.5 lb

## 2017-08-23 DIAGNOSIS — D696 Thrombocytopenia, unspecified: Secondary | ICD-10-CM

## 2017-08-23 DIAGNOSIS — Z794 Long term (current) use of insulin: Secondary | ICD-10-CM

## 2017-08-23 DIAGNOSIS — H60541 Acute eczematoid otitis externa, right ear: Secondary | ICD-10-CM | POA: Diagnosis not present

## 2017-08-23 DIAGNOSIS — E1140 Type 2 diabetes mellitus with diabetic neuropathy, unspecified: Secondary | ICD-10-CM | POA: Diagnosis not present

## 2017-08-23 DIAGNOSIS — E1169 Type 2 diabetes mellitus with other specified complication: Secondary | ICD-10-CM

## 2017-08-23 DIAGNOSIS — G4700 Insomnia, unspecified: Secondary | ICD-10-CM

## 2017-08-23 DIAGNOSIS — M2550 Pain in unspecified joint: Secondary | ICD-10-CM

## 2017-08-23 DIAGNOSIS — E785 Hyperlipidemia, unspecified: Secondary | ICD-10-CM

## 2017-08-23 DIAGNOSIS — B079 Viral wart, unspecified: Secondary | ICD-10-CM

## 2017-08-23 DIAGNOSIS — I10 Essential (primary) hypertension: Secondary | ICD-10-CM

## 2017-08-23 DIAGNOSIS — F418 Other specified anxiety disorders: Secondary | ICD-10-CM

## 2017-08-23 MED ORDER — ZOLPIDEM TARTRATE 10 MG PO TABS: 10.0000 mg | ORAL_TABLET | Freq: Every day | ORAL | 0 refills | Status: DC

## 2017-08-23 MED ORDER — IBUPROFEN 200 MG PO TABS: 200.0000 mg | ORAL_TABLET | Freq: Three times a day (TID) | ORAL | 0 refills | Status: DC | PRN

## 2017-08-23 MED ORDER — NEOMYCIN-POLYMYXIN-HC 3.5-10000-1 OT SOLN: 3.0000 [drp] | Freq: Three times a day (TID) | OTIC | 0 refills | Status: DC

## 2017-08-23 MED ORDER — OXYCODONE-ACETAMINOPHEN 5-325 MG PO TABS: ORAL_TABLET | ORAL | 0 refills | Status: DC

## 2017-08-23 NOTE — Patient Instructions (Addendum)
START CORTISPORIN EAR DROPS IN RIGHT EAR 3 TIMES DAILY FOR 5 DAYS  MAY TAKE IBUPROFEN/ADVIL 200MG  TAKE 3 TABS EVERY 8 HRS AS NEEDED FOR JOINT PAIN. TAKE WITH FOOD  MAY USE OTC WART REMOVER TO CHEST SCAR AREA  Will call with lab results  Continue other medications as ordered  Continue with diet and exercise program  Follow up in 3 mos with Shanda Bumps. Fasting labs prior to appt   Warts Warts are small growths on the skin. They are common, and they are caused by a type of germ (virus). Warts can occur on many areas of the body. A person may have one wart or more than one wart. Warts can spread if you scratch a wart and then scratch normal skin. Most warts will go away over many months to a couple years. Treatments may be done if needed. Follow these instructions at home:  Apply over-the-counter and prescription medicines only as told by your doctor.  Do not apply over-the-counter wart medicines to your face or genitals before you ask your doctor if it is okay to do that.  Do not scratch or pick at a wart.  Wash your hands after you touch a wart.  Avoid shaving hair that is over a wart.  Keep all follow-up visits as told by your doctor. This is important. Contact a doctor if:  Your warts do not improve after treatment.  You have redness, swelling, or pain at the site of a wart.  You have bleeding from a wart, and the bleeding does not stop when you put light pressure on the wart.  You have diabetes and you get a wart. This information is not intended to replace advice given to you by your health care provider. Make sure you discuss any questions you have with your health care provider. Document Released: 05/14/2010 Document Revised: 06/19/2015 Document Reviewed: 04/08/2014 Elsevier Interactive Patient Education  [employer].

## 2017-08-23 NOTE — Progress Notes (Signed)
Patient ID: Barry [name], male   DOB: Dec 26, 1955, 62 y.o.   MRN: [id number]   Location:  [facility] OFFICE  Provider: DR Arletha Grippe  Code Status: FULL CODE Goals of Care:  Advanced Directives 05/18/2017  Does Patient Have a Medical Advance Directive? No  Type of Advance Directive -  [place] in Chart? -  Would patient like information on creating a medical advance directive? -  Pre-existing out of facility DNR order (yellow form or pink MOST form) -     Chief Complaint  Patient presents with  . Medical Management of Chronic Issues    3 mo f/u- Dm, HTN, Hyperlipidemia, depression, C/O ear /sinus infections    HPI: Patient is a 62 y.o. male seen today for medical management of chronic diseases.  BS fluctuate 180-270s. No low BS reactions. No numbness/tingling. He reports 3 week hx right otalgia with associated sinus pressure/pain. Tried nothing OTC. No f/c. (+) sore throat. He reports increased fatigue despite CPAP use at night.  He saw cardio Dr Gwenlyn Found and had cardiac cath 04/18/17 (due to abnormal myoview stress test) that revealed occluded RCA vein graft with L-->R collaterals, a high grade in-stent restenotic lesion within previous stented segment that supplies very small area of PDA. Medical therapy recommended. EF 55-60% by 2D echo in March 2019. He continues to struggle with pain in right shoulder that awakens him from sleep. ambien not effective in sleep. He did see neurology Dr Posey Pronto for neuropathy. NCS/EMG revealed b/l UE polyneuropathy c/w diabetic neuropathy. B1 level low and was started on B supplement. MRI L spine recommended due to increased LE reflexes and to r/o spinal stenosis but pt declined  Depression with anxiety/insomnia - mood stable on sertraline 12m qhs; buspar TID. He is not sleeping well due to pain.  HTN - BP controlled on lisinopril and lopressor. He takes ASA daily  DM - uncontrolled but improving. BS checked fasting fluctuating.  Occasional low BS reactions  (weak/dizziness/shaking) relieved with eating and/or drinking. He takes toujeo 36 units daily and metformin. No longer on farxiga 2/2 cost. A1c 8.4% (prev 9.1%). Urine microalbumin/Cr ratio 60. LDL at goal on statin. He takes ACEI  Hyperlipidemia - stable on lipitor. LDL 59; HDL 38  Chronic pain/Knee pain - uncontrolled on percocet 4 tabs daily. He has b/l knee pain. He has pain in his ankles at night when he tries to cross them. He states "my whole body hurts"; right shoulder and hand very painful and "feels like it will break". He has seen ortho in the past and rec'd injections into knees but now shoulders R>L really hurting  CAD - s/p CABG x 3 vessels. stable.  No CP. Occasional SOB. Takes BB, ACEI and plavix. He has not seen Cardio Dr BGwenlyn Foundfor f/u since 2016  Sleep apnea - wears CPAP qhs now.  He gets a rash on face due to mask. Excessive daytime sleepiness has improved  Morbid obesity - actively dieting. Weight fluctuates. BMI 41.99 (slightly worse)  Elevated liver enzymes - worse. ALT 61 (prev 53); AST 56 (prev 45)  GERD - sx's controlled on omeprazole.   Low platelets - etiology unknown. Plts 99K    Past Medical History:  Diagnosis Date  . Coronary artery disease   . [place]   inferior wall  . [place]bid [place]  . S/P CABG x 3 07/04/2012   LIMA to LAD, SVG to D1, SVG to PDA,  EVH via right thigh  . Sleep apnea   . Type II or unspecified type diabetes mellitus without mention of complication, not stated as uncontrolled   . Unspecified essential hypertension     Past Surgical History:  Procedure Laterality Date  . CORONARY ANGIOPLASTY WITH STENT PLACEMENT  04/23/2007   PCI and stenting of mid RCA - Dr Donnetta Hutching @ [facility]  . CORONARY ARTERY BYPASS GRAFT N/A 07/04/2012   Procedure: CORONARY ARTERY BYPASS GRAFTING (CABG);  Surgeon: Rexene Alberts, MD;  Location: [facility];  Service: Open Heart Surgery;   Laterality: N/A;  x3 using right greater saphenous vein and left internal mammary.   . INTRAOPERATIVE TRANSESOPHAGEAL ECHOCARDIOGRAM N/A 07/04/2012   Procedure: INTRAOPERATIVE TRANSESOPHAGEAL ECHOCARDIOGRAM;  Surgeon: Rexene Alberts, MD;  Location: [facility];  Service: Open Heart Surgery;  Laterality: N/A;  . LEFT HEART CATH AND CORS/GRAFTS ANGIOGRAPHY N/A 04/18/2017   Procedure: LEFT HEART CATH AND CORS/GRAFTS ANGIOGRAPHY;  Surgeon: Lorretta Harp, MD;  Location: [facility];  Service: Cardiovascular;  Laterality: N/A;  . LEFT HEART CATHETERIZATION WITH CORONARY ANGIOGRAM N/A 06/25/2012   Procedure: LEFT HEART CATHETERIZATION WITH CORONARY ANGIOGRAM;  Surgeon: Lorretta Harp, MD;  Location: [facility] CATH LAB;  Service: Cardiovascular;  Laterality: N/A;     reports that he has never smoked. He has never used smokeless tobacco. He reports that he does not drink alcohol or use drugs. Social History   Socioeconomic History  . Marital status: Divorced    Spouse name: Not on file  . Number of children: 1  . Years of education: Not on file  . Highest education level: Not on file  Occupational History  . Occupation: MAINTENANCE    Employer: [employer]  Social Needs  . Financial resource strain: Not on file  . Food insecurity:    Worry: Not on file    Inability: Not on file  . Transportation needs:    Medical: Not on file    Non-medical: Not on file  Tobacco Use  . Smoking status: Never Smoker  . Smokeless tobacco: Never Used  Substance and Sexual Activity  . Alcohol use: No    Alcohol/week: 0.0 oz  . Drug use: No  . Sexual activity: Not on file  Lifestyle  . Physical activity:    Days per week: Not on file    Minutes per session: Not on file  . Stress: Not on file  Relationships  . Social connections:    Talks on phone: Not on file    Gets together: Not on file    Attends religious service: Not on file    Active member of club or organization: Not on file    Attends  meetings of clubs or organizations: Not on file    Relationship status: Not on file  . Intimate partner violence:    Fear of current or ex partner: Not on file    Emotionally abused: Not on file    Physically abused: Not on file    Forced sexual activity: Not on file  Other Topics Concern  . Not on file  Social History Narrative   Lives alone in a one story home.  Has one daughter.  Works as a Air traffic controller.  Education: high school.     Family History  Problem Relation Age of Onset  . Cancer Mother   . Diabetes Sister   . Diabetes Brother     No Known Allergies  Outpatient Encounter Medications as of 08/23/2017  Medication Sig  . albuterol (PROVENTIL HFA;VENTOLIN HFA) 108 (90 Base) MCG/ACT inhaler Inhale 2 puffs into the lungs every 6 (six) hours as needed for wheezing or shortness of breath.  Marland Kitchen aspirin 81 MG chewable tablet Chew 81 mg by mouth daily.   Marland Kitchen atorvastatin (LIPITOR) 80 MG tablet Take 1 tablet (80 mg total) by mouth daily.  . Blood Glucose Monitoring Suppl (CONTOUR NEXT EZ MONITOR) w/Device KIT Test blood sugar three times daily E11.22  . busPIRone (BUSPAR) 15 MG tablet TAKE 1 TABLET BY MOUTH THREE TIMES DAILY FOR ANXIETY  . clopidogrel (PLAVIX) 75 MG tablet Take one tablet by mouth once daily  . glucose blood (BAYER CONTOUR NEXT TEST) test strip Use as instructed  . Insulin Glargine (TOUJEO SOLOSTAR) 300 UNIT/ML SOPN Inject 36 Units into the skin daily at 6 (six) AM.  . Insulin Pen Needle 32G X 4 MM MISC Use as Directed. Dx: E11.40  . lisinopril (PRINIVIL,ZESTRIL) 5 MG tablet Take 1 tablet (5 mg total) by mouth daily.  . meloxicam (MOBIC) 7.5 MG tablet Take 1 tablet (7.5 mg total) by mouth daily.  . metFORMIN (GLUCOPHAGE) 1000 MG tablet TAKE 1 TABLET(1000 MG) BY MOUTH TWICE DAILY  . metoprolol tartrate (LOPRESSOR) 25 MG tablet Take 0.5 tablets (12.5 mg total) by mouth 2 (two) times daily.  Marland Kitchen omeprazole (PRILOSEC) 40 MG capsule Take 1 capsule (40 mg total) by  mouth daily.  Marland Kitchen oxyCODONE-acetaminophen (PERCOCET/ROXICET) 5-325 MG tablet Take 2 tablets by mouth in the morning, and Take 2 tablets by mouth in the evening  . sertraline (ZOLOFT) 100 MG tablet TAKE 1 AND 1/2 TABLETS BY MOUTH AT BEDTIME  . sildenafil (VIAGRA) 100 MG tablet Take 1 tablet (100 mg total) by mouth daily. as directed  . thiamine (VITAMIN B-1) 100 MG tablet Take 100 mg by mouth daily.  Marland Kitchen zolpidem (AMBIEN) 10 MG tablet Take 1 tablet (10 mg total) by mouth at bedtime.   No facility-administered encounter medications on file as of 08/23/2017.     Review of Systems:  Review of Systems  Constitutional: Positive for fatigue.  HENT: Positive for ear pain, sinus pressure, sinus pain and sore throat.   Musculoskeletal: Positive for arthralgias.  All other systems reviewed and are negative.   Health Maintenance  Topic Date Due  . HIV Screening  02/16/2018 (Originally 04/27/1970)  . Hepatitis C Screening  01/26/2023 (Originally [birth year])  . INFLUENZA VACCINE  08/25/2017  . PNEUMOCOCCAL POLYSACCHARIDE VACCINE (2) 09/05/2017  . OPHTHALMOLOGY EXAM  11/13/2017  . FOOT EXAM  11/16/2017  . HEMOGLOBIN A1C  11/18/2017  . COLONOSCOPY  11/22/2023  . TETANUS/TDAP  05/07/2024    Physical Exam: Vitals:   08/23/17 0851  BP: 122/74  Pulse: 68  Resp: 20  Temp: 98.1 F (36.7 C)  TempSrc: Oral  SpO2: 96%  Weight: 262 lb 8 oz (119.1 kg)  Height: _0  (1.676 m)   Body mass index is 42.37 kg/m. Physical Exam  Constitutional: He is oriented to person, place, and time. He appears well-developed and well-nourished.  HENT:  Right Ear: No drainage, swelling or tenderness. Tympanic membrane is scarred. Tympanic membrane is not injected, not perforated, not erythematous, not retracted and not bulging. No middle ear effusion.  Ears:  Nose: No rhinorrhea. Right sinus exhibits maxillary sinus tenderness (with boggy tissue texture changes). Right sinus exhibits no frontal sinus tenderness. Left  sinus exhibits no maxillary sinus tenderness and no frontal sinus tenderness.  Mouth/Throat: Oropharynx is clear and moist.  MMM;  no oral thrush  Eyes: Pupils are equal, round, and reactive to light. No scleral icterus.  Neck: Neck supple. Carotid bruit is not present. No thyromegaly present.  Cardiovascular: Normal rate, regular rhythm and intact distal pulses. Exam reveals no gallop and no friction rub.  Murmur (1/6 SEM) heard. no distal LE swelling. No calf TTP  Pulmonary/Chest: Effort normal and breath sounds normal. He has no wheezes. He has no rales. He exhibits no tenderness.  Abdominal: Soft. Bowel sounds are normal. He exhibits no distension, no abdominal bruit, no pulsatile midline mass and no mass. There is no hepatomegaly. There is no tenderness. There is no rebound and no guarding.  obese  Musculoskeletal: He exhibits edema.  Lymphadenopathy:    He has no cervical adenopathy.  Neurological: He is alert and oriented to person, place, and time. He has normal reflexes.  Skin: Skin is warm and dry. Bruising (abdomen at insulin injection site), lesion and rash noted. Rash is macular.     Psychiatric: His behavior is normal. Judgment and thought content normal. He exhibits a depressed mood.    Labs reviewed: Basic Metabolic Panel: Recent Labs    11/22/16 0927 02/16/17 0925 04/14/17 0824 05/19/17 0814  NA 136  --  139 136  K 4.7  --  4.9 4.8  CL 100  --  103 101  CO2 27  --  24 30  GLUCOSE 291*  --  162* 170*  BUN 16  --  19 13  CREATININE 0.96  --  0.89 0.88  CALCIUM 9.2  --  8.8 9.6  TSH 1.48 1.63  --   --    Liver Function Tests: Recent Labs    11/22/16 0927 03/18/17 1332 05/19/17 0814  AST 56*  --  65*  ALT 61*  --  63*  63*  BILITOT 0.6  --  0.7  PROT 7.7 7.3 7.5   No results for input(s): LIPASE, AMYLASE in the last 8760 hours. No results for input(s): AMMONIA in the last 8760 hours. CBC: Recent Labs    02/16/17 0925 03/21/17 0859 04/14/17 0824    WBC 5.7 6.8 4.4  NEUTROABS 3,853 4,474 2.7  HGB 13.5 13.1* 13.0  HCT 41.0 39.9 39.5  MCV 90.1 90.3 91  PLT 116* 111* 99*   Lipid Panel: Recent Labs    11/22/16 0927 02/16/17 0925 05/19/17 0814  CHOL 125 113 113  HDL 38* 35* 38*  LDLCALC 69 61 59  TRIG 101 89 75  CHOLHDL 3.3 3.2 3.0   Lab Results  Component Value Date   HGBA1C 8.4 (H) 05/19/2017    Procedures since last visit: No results found.  Assessment/Plan   ICD-10-CM   1. Type 2 diabetes mellitus with diabetic neuropathy, with long-term current use of insulin (HCC) E11.40 CMP with eGFR([facility])   Z79.4 Hemoglobin A1c  2. Depression with anxiety F41.8   3. Insomnia, unspecified type G47.00 zolpidem (AMBIEN) 10 MG tablet  4. Acute eczematoid otitis externa of right ear H60.541 neomycin-polymyxin-hydrocortisone (CORTISPORIN) OTIC solution  5. Viral warts, unspecified type B07.9   6. Essential hypertension, benign I10   7. Pain in joint involving multiple sites M25.50 oxyCODONE-acetaminophen (PERCOCET/ROXICET) 5-325 MG tablet    ibuprofen (ADVIL) 200 MG tablet  8. Thrombocytopenia (HCC) D69.6 CBC (no diff)  9. Hyperlipidemia associated with type 2 diabetes mellitus (HCC) E11.69 Lipid Panel   E78.5   10. Morbid obesity due to excess calories (HCC) E66.01     START CORTISPORIN EAR DROPS IN  RIGHT EAR 3 TIMES DAILY FOR 5 DAYS  MAY TAKE IBUPROFEN/ADVIL 200MG TAKE 3 TABS EVERY 8 HRS AS NEEDED FOR JOINT PAIN. TAKE WITH FOOD  MAY USE OTC WART REMOVER TO CHEST SCAR AREA  Will call with lab results  Continue other medications as ordered  Continue with diet and exercise program  Follow up in 3 mos with [name]. Fasting labs prior to appt   Clifford S. Perlie Gold  [facility] [address] [place], [place] [address] [phone] Cell (Monday-Friday 8 AM - 5 PM) [phone] After 5 PM and follow prompts

## 2017-08-24 LAB — CBC
HEMATOCRIT: 41.4 % (ref 38.5–50.0)
HEMOGLOBIN: 13.6 g/dL (ref 13.2–17.1)
MCH: 30.2 pg (ref 27.0–33.0)
MCHC: 32.9 g/dL (ref 32.0–36.0)
MCV: 92 fL (ref 80.0–100.0)
MPV: 12.2 fL (ref 7.5–12.5)
Platelets: 114 10*3/uL — ABNORMAL LOW (ref 140–400)
RBC: 4.5 10*6/uL (ref 4.20–5.80)
RDW: 14.9 % (ref 11.0–15.0)
WBC: 6.5 10*3/uL (ref 3.8–10.8)

## 2017-08-24 LAB — COMPLETE METABOLIC PANEL WITH GFR
AG RATIO: 1.2 (calc) (ref 1.0–2.5)
ALBUMIN MSPROF: 4.3 g/dL (ref 3.6–5.1)
ALT: 70 U/L — AB (ref 9–46)
AST: 60 U/L — ABNORMAL HIGH (ref 10–35)
Alkaline phosphatase (APISO): 92 U/L (ref 40–115)
BILIRUBIN TOTAL: 0.7 mg/dL (ref 0.2–1.2)
BUN: 15 mg/dL (ref 7–25)
CHLORIDE: 102 mmol/L (ref 98–110)
CO2: 26 mmol/L (ref 20–32)
Calcium: 9.4 mg/dL (ref 8.6–10.3)
Creat: 0.77 mg/dL (ref 0.70–1.25)
GFR, EST AFRICAN AMERICAN: 114 mL/min/{1.73_m2} (ref >=60)
GFR, Est Non African American: 99 mL/min/{1.73_m2} (ref >=60)
Globulin: 3.5 g/dL (calc) (ref 1.9–3.7)
Glucose, Bld: 208 mg/dL — ABNORMAL HIGH (ref 65–99)
POTASSIUM: 4.8 mmol/L (ref 3.5–5.3)
SODIUM: 135 mmol/L (ref 135–146)
TOTAL PROTEIN: 7.8 g/dL (ref 6.1–8.1)

## 2017-08-24 LAB — LIPID PANEL
Cholesterol: 127 mg/dL (ref <200)
HDL: 46 mg/dL (ref >40)
LDL Cholesterol (Calc): 64 mg/dL (calc)
NON-HDL CHOLESTEROL (CALC): 81 mg/dL (ref <130)
TRIGLYCERIDES: 86 mg/dL (ref <150)
Total CHOL/HDL Ratio: 2.8 (calc) (ref <5.0)

## 2017-08-24 LAB — HEMOGLOBIN A1C
Hgb A1c MFr Bld: 8.9 % of total Hgb — ABNORMAL HIGH (ref <5.7)
MEAN PLASMA GLUCOSE: 209 (calc)
eAG (mmol/L): 11.6 (calc)

## 2017-08-26 DIAGNOSIS — E785 Hyperlipidemia, unspecified: Secondary | ICD-10-CM

## 2017-08-26 DIAGNOSIS — D696 Thrombocytopenia, unspecified: Secondary | ICD-10-CM | POA: Insufficient documentation

## 2017-08-26 DIAGNOSIS — E1169 Type 2 diabetes mellitus with other specified complication: Secondary | ICD-10-CM | POA: Insufficient documentation

## 2017-08-31 ENCOUNTER — Ambulatory Visit: Payer: Self-pay | Admitting: Internal Medicine

## 2017-09-02 ENCOUNTER — Telehealth: Payer: Self-pay

## 2017-09-02 NOTE — Telephone Encounter (Signed)
Incoming fax received from [facility]  Patient with pending appointment 11/03/17, 2:00 pm with Dr.William Katrinka Blazing to follow-up on Diabetes

## 2017-09-06 ENCOUNTER — Other Ambulatory Visit: Payer: Self-pay | Admitting: *Deleted

## 2017-09-06 DIAGNOSIS — G4700 Insomnia, unspecified: Secondary | ICD-10-CM

## 2017-09-06 MED ORDER — ZOLPIDEM TARTRATE 10 MG PO TABS: 10.0000 mg | ORAL_TABLET | Freq: Every day | ORAL | 0 refills | Status: DC

## 2017-09-06 NOTE — Telephone Encounter (Signed)
Patient requested refill. Phoned to pharmacy.  

## 2017-09-09 ENCOUNTER — Other Ambulatory Visit: Payer: Self-pay | Admitting: Internal Medicine

## 2017-09-09 DIAGNOSIS — Z794 Long term (current) use of insulin: Principal | ICD-10-CM

## 2017-09-09 DIAGNOSIS — E1140 Type 2 diabetes mellitus with diabetic neuropathy, unspecified: Secondary | ICD-10-CM

## 2017-09-14 ENCOUNTER — Encounter: Payer: Self-pay | Admitting: Internal Medicine

## 2017-09-18 ENCOUNTER — Other Ambulatory Visit: Payer: Self-pay | Admitting: Internal Medicine

## 2017-09-18 DIAGNOSIS — M2550 Pain in unspecified joint: Secondary | ICD-10-CM

## 2017-09-27 ENCOUNTER — Other Ambulatory Visit: Payer: Self-pay | Admitting: *Deleted

## 2017-09-27 DIAGNOSIS — M2550 Pain in unspecified joint: Secondary | ICD-10-CM

## 2017-09-27 MED ORDER — OXYCODONE-ACETAMINOPHEN 5-325 MG PO TABS: ORAL_TABLET | ORAL | 0 refills | Status: DC

## 2017-09-27 NOTE — Telephone Encounter (Signed)
Patient requested refill.  NCCSRS Database Verified LR: 08/23/17 Pharmacy Confirmed Pended Rx and sent to Dr. Montez Morita for approval.

## 2017-10-15 ENCOUNTER — Other Ambulatory Visit: Payer: Self-pay | Admitting: Internal Medicine

## 2017-10-17 ENCOUNTER — Other Ambulatory Visit: Payer: Self-pay | Admitting: *Deleted

## 2017-10-17 DIAGNOSIS — G4700 Insomnia, unspecified: Secondary | ICD-10-CM

## 2017-10-17 MED ORDER — ZOLPIDEM TARTRATE 10 MG PO TABS: 10.0000 mg | ORAL_TABLET | Freq: Every day | ORAL | 0 refills | Status: DC

## 2017-10-17 NOTE — Telephone Encounter (Signed)
Received fax refill request from [facility].  NCCSRS Database Verified LR: 09/06/17 Phoned Rx to pharmacy.

## 2017-10-24 ENCOUNTER — Encounter: Payer: Self-pay | Admitting: Nurse Practitioner

## 2017-11-03 DIAGNOSIS — K219 Gastro-esophageal reflux disease without esophagitis: Secondary | ICD-10-CM | POA: Insufficient documentation

## 2017-11-16 ENCOUNTER — Telehealth: Payer: Self-pay | Admitting: *Deleted

## 2017-11-16 DIAGNOSIS — E1140 Type 2 diabetes mellitus with diabetic neuropathy, unspecified: Secondary | ICD-10-CM

## 2017-11-16 DIAGNOSIS — Z794 Long term (current) use of insulin: Principal | ICD-10-CM

## 2017-11-16 NOTE — Telephone Encounter (Signed)
I placed a C3 referral to help with resources

## 2017-11-16 NOTE — Telephone Encounter (Signed)
Patient called and stated that he has an upcoming appointment on Monday with Jessica. Stated that he lost his job as of 10/10/17, he was fired. Patient has no insurance and no money. Stopped taking his pain medications because he couldn't afford them.  Stated that he is seeing Dr. Katrinka Blazing with [facility] for his Diabetes. Patient knows he needs to be seen since his Diabetes are not under control but he is not sure what to do because he can't afford it and can't pay up front. Patient is looking at trying to get unemployment. Stated he would like your advise. Please Advise.

## 2017-11-17 NOTE — Telephone Encounter (Signed)
Patient notified. LMOM.

## 2017-11-18 ENCOUNTER — Other Ambulatory Visit: Payer: BLUE CROSS/BLUE SHIELD

## 2017-11-23 ENCOUNTER — Ambulatory Visit: Payer: BLUE CROSS/BLUE SHIELD | Admitting: Nurse Practitioner

## 2017-11-28 ENCOUNTER — Ambulatory Visit: Payer: BLUE CROSS/BLUE SHIELD | Admitting: Nurse Practitioner

## 2018-01-11 ENCOUNTER — Telehealth: Payer: Self-pay | Admitting: Internal Medicine

## 2018-01-11 NOTE — Telephone Encounter (Signed)
I left a message asking the patient to call me at [phone] about community resource referral. VDM (DD) ° °

## 2018-01-20 NOTE — Telephone Encounter (Signed)
2nd attempt to reach patient about community resource referral.  I left a message. VDM (DD)

## 2018-01-24 NOTE — Telephone Encounter (Signed)
3rd attempt to reach patient. I left a message. VDM (DD)

## 2018-04-26 HISTORY — PX: TOOTH EXTRACTION: SUR596

## 2018-04-26 HISTORY — PX: DENTAL SURGERY: SHX609

## 2018-05-22 ENCOUNTER — Telehealth: Payer: Self-pay

## 2018-05-22 NOTE — Telephone Encounter (Signed)
Spoke with patient and he schedule appt

## 2018-05-22 NOTE — Telephone Encounter (Signed)
Called patient to confirm if he is a current patient. No answer. Left message to return call.

## 2018-05-23 ENCOUNTER — Telehealth: Payer: Self-pay | Admitting: Cardiovascular Disease

## 2018-05-23 NOTE — Telephone Encounter (Signed)
LVM to schedule

## 2018-06-07 ENCOUNTER — Other Ambulatory Visit: Payer: Self-pay

## 2018-06-07 ENCOUNTER — Encounter: Payer: Self-pay | Admitting: Nurse Practitioner

## 2018-06-07 ENCOUNTER — Ambulatory Visit (INDEPENDENT_AMBULATORY_CARE_PROVIDER_SITE_OTHER): Payer: Self-pay | Admitting: Nurse Practitioner

## 2018-06-07 DIAGNOSIS — D696 Thrombocytopenia, unspecified: Secondary | ICD-10-CM

## 2018-06-07 DIAGNOSIS — Z794 Long term (current) use of insulin: Secondary | ICD-10-CM

## 2018-06-07 DIAGNOSIS — M150 Primary generalized (osteo)arthritis: Secondary | ICD-10-CM

## 2018-06-07 DIAGNOSIS — F339 Major depressive disorder, recurrent, unspecified: Secondary | ICD-10-CM

## 2018-06-07 DIAGNOSIS — I10 Essential (primary) hypertension: Secondary | ICD-10-CM

## 2018-06-07 DIAGNOSIS — E785 Hyperlipidemia, unspecified: Secondary | ICD-10-CM

## 2018-06-07 DIAGNOSIS — E1140 Type 2 diabetes mellitus with diabetic neuropathy, unspecified: Secondary | ICD-10-CM

## 2018-06-07 DIAGNOSIS — Z5989 Other problems related to housing and economic circumstances: Secondary | ICD-10-CM

## 2018-06-07 DIAGNOSIS — G4700 Insomnia, unspecified: Secondary | ICD-10-CM

## 2018-06-07 DIAGNOSIS — I2510 Atherosclerotic heart disease of native coronary artery without angina pectoris: Secondary | ICD-10-CM

## 2018-06-07 DIAGNOSIS — R748 Abnormal levels of other serum enzymes: Secondary | ICD-10-CM

## 2018-06-07 DIAGNOSIS — M159 Polyosteoarthritis, unspecified: Secondary | ICD-10-CM

## 2018-06-07 DIAGNOSIS — Z598 Other problems related to housing and economic circumstances: Secondary | ICD-10-CM

## 2018-06-07 MED ORDER — INSULIN GLARGINE (1 UNIT DIAL) 300 UNIT/ML ~~LOC~~ SOPN: 45.0000 [IU] | PEN_INJECTOR | Freq: Every day | SUBCUTANEOUS | Status: DC

## 2018-06-07 NOTE — Progress Notes (Signed)
This service is provided via telemedicine  No vital signs collected/recorded due to the encounter was a telemedicine visit.   Location of patient (ex: home, work): Home  Patient consents to a telephone visit:  Yes  Location of the provider (ex: office, home): [facility], Office   Name of any referring provider:  N/A  Names of all persons participating in the telemedicine service and their role in the encounter: S.Chrae B/CMA, Sherrie Mustache, NP, and Patient   Time spent on call:  11 min with medical assistant        Careteam: Patient Care Team: [name], NP as PCP - General (Geriatric Medicine) Lorretta Harp, MD as Consulting Physician (Cardiology)  Advanced Directive information Does Patient Have a Medical Advance Directive?: No, Would patient like information on creating a medical advance directive?: Yes (MAU/Ambulatory/Procedural Areas - Information given)(paperwork given at previous visit )  No Known Allergies  Chief Complaint  Patient presents with  . Medical Management of Chronic Issues    10 month follow-up. A1c Due. Patient stopped Oxycodone (cost). Moderate fall risk. Telephone visit   . Best Practice Recommendations    Discuss recommendation for HIV Screening   . Quality Metric Gaps    Discuss recommended eye exam     HPI: Patient is a 63 y.o. male for routine follow up.  Former pt of Dr Eulas Post, has not been seen in office since 07/2017. Let go of his job in October 2019 and has not had insurance.  Denied unemployment. Trying to file for disability.  [facility] [place] who renewed some medications, otherwise getting medication from [place] [facility] due to lack of insurance. Not getting blood work.  Has been seen by endocrinologist for diabetes.  He has had diabetes since 2014. A1c 7.7. currently taking toujeo 42 units daily, also on metformin . Had to reschedule follow up with endocrine due to lack of  insurance.  Blood sugars 220 fasting.  Reports he "has the munchies" but eats a lot before he goes to bed. Eats a lot due to stress. No low blood sugar reactions.   Hyperlipidemia- continues on lipitor 80 mg. Not following dietary modifications.   Depression/anxiety- ongoing, does not have time to see therapist. Continues on zoloft and buspar.  Very restless at night. No SI or HI. "Deal with it"  Insomnia- was previously taking ambien 10 mg by mouth at bedtime but unable to take at this time. "needs to be alert" put him in a deep sleep. Currently living with his ex wife who recently had a stroke.   htn- just got a machine, hx of MI. Has not taken blood pressure. Taking lopressor BID, lisinopril daily  CAD, hx MI, S/p CABG, taking plavix and ASA 81 mg daily. Previously followed by cardiology but has not seen him or followed up. Reports occasional chest pains. Off and on.  Notices when he lays or sits down. Report sensation more of a flutter vs pain. Not necessarily with exertion.   Osteoarthritis- Unable to get around like he used to. Can not lift. A lot of trouble with knees and joints. Reports generalized arthritis. Shoulders hurt. Saw orthopedics last year and wanted him to do surgery but he could not take the time off.  Got injection which was beneficial.   GERD- controlled on omeprazole.   Applying for disability and to get medicare.   Review of Systems:  Review of Systems  Constitutional:  Positive for malaise/fatigue. Negative for chills, fever and weight loss.  HENT: Negative for tinnitus.   Respiratory: Negative for cough, sputum production and shortness of breath.   Cardiovascular: Positive for palpitations. Negative for chest pain and leg swelling.  Gastrointestinal: Negative for abdominal pain, constipation, diarrhea and heartburn.  Genitourinary: Negative for dysuria, frequency and urgency.  Musculoskeletal: Positive for back pain, joint pain and myalgias. Negative for  falls.  Skin: Negative.   Neurological: Positive for weakness. Negative for dizziness and headaches.  Psychiatric/Behavioral: Positive for depression. Negative for memory loss. The patient is nervous/anxious. The patient does not have insomnia.   ``  Past Medical History:  Diagnosis Date  . Coronary artery disease   . MI (myocardial infarction) ([place]) 04/23/2007   inferior wall  . Morbid obesity ([place]) 06/26/2012  . S/P CABG x 3 07/04/2012   LIMA to LAD, SVG to D1, SVG to PDA, EVH via right thigh  . Sleep apnea   . Type II or unspecified type diabetes mellitus without mention of complication, not stated as uncontrolled   . Unspecified essential hypertension    Past Surgical History:  Procedure Laterality Date  . CORONARY ANGIOPLASTY WITH STENT PLACEMENT  04/23/2007   PCI and stenting of mid RCA - Dr Donnetta Hutching @ [facility]  . CORONARY ARTERY BYPASS GRAFT N/A 07/04/2012   Procedure: CORONARY ARTERY BYPASS GRAFTING (CABG);  Surgeon: Rexene Alberts, MD;  Location: [place];  Service: Open Heart Surgery;  Laterality: N/A;  x3 using right greater saphenous vein and left internal mammary.   . INTRAOPERATIVE TRANSESOPHAGEAL ECHOCARDIOGRAM N/A 07/04/2012   Procedure: INTRAOPERATIVE TRANSESOPHAGEAL ECHOCARDIOGRAM;  Surgeon: Rexene Alberts, MD;  Location: [place];  Service: Open Heart Surgery;  Laterality: N/A;  . LEFT HEART CATH AND CORS/GRAFTS ANGIOGRAPHY N/A 04/18/2017   Procedure: LEFT HEART CATH AND CORS/GRAFTS ANGIOGRAPHY;  Surgeon: Lorretta Harp, MD;  Location: [place] CV LAB;  Service: Cardiovascular;  Laterality: N/A;  . LEFT HEART CATHETERIZATION WITH CORONARY ANGIOGRAM N/A 06/25/2012   Procedure: LEFT HEART CATHETERIZATION WITH CORONARY ANGIOGRAM;  Surgeon: Lorretta Harp, MD;  Location: [facility] CATH LAB;  Service: Cardiovascular;  Laterality: N/A;  . TOOTH EXTRACTION  04/2018   4 teeth pulled    Social History:   reports that he has never smoked. He has never used smokeless tobacco. He reports that  he does not drink alcohol or use drugs.  Family History  Problem Relation Age of Onset  . Cancer Mother   . Diabetes Sister   . Diabetes Brother     Medications: Patient's Medications  New Prescriptions   No medications on file  Previous Medications   ALBUTEROL (PROVENTIL HFA;VENTOLIN HFA) 108 (90 BASE) MCG/ACT INHALER    Inhale 2 puffs into the lungs every 6 (six) hours as needed for wheezing or shortness of breath.   ASPIRIN 81 MG CHEWABLE TABLET    Chew 81 mg by mouth daily.    ATORVASTATIN (LIPITOR) 80 MG TABLET    Take 1 tablet (80 mg total) by mouth daily.   BLOOD GLUCOSE MONITORING SUPPL (CONTOUR NEXT EZ MONITOR) W/DEVICE KIT    Test blood sugar three times daily E11.22   BUSPIRONE (BUSPAR) 15 MG TABLET    TAKE 1 TABLET BY MOUTH THREE TIMES DAILY FOR ANXIETY   CLOPIDOGREL (PLAVIX) 75 MG TABLET    Take one tablet by mouth once daily   GLUCOSE BLOOD (BAYER CONTOUR NEXT TEST) TEST STRIP    Use as instructed   IBUPROFEN (  ADVIL) 200 MG TABLET    Take 1 tablet (200 mg total) by mouth every 8 (eight) hours as needed for mild pain or moderate pain.   INSULIN GLARGINE, 1 UNIT DIAL, (TOUJEO SOLOSTAR) 300 UNIT/ML SOPN    Inject 42 Units into the skin. Managed by Dr.Smith endocrinologist   INSULIN PEN NEEDLE 32G X 4 MM MISC    Use as Directed. Dx: E11.40   LISINOPRIL (PRINIVIL,ZESTRIL) 5 MG TABLET    TAKE 1 TABLET(5 MG) BY MOUTH DAILY   MELOXICAM (MOBIC) 7.5 MG TABLET    TAKE 1 TABLET(7.5 MG) BY MOUTH DAILY   METFORMIN (GLUCOPHAGE) 1000 MG TABLET    TAKE 1 TABLET(1000 MG) BY MOUTH TWICE DAILY   METOPROLOL TARTRATE (LOPRESSOR) 25 MG TABLET    Take 0.5 tablets (12.5 mg total) by mouth 2 (two) times daily.   OMEPRAZOLE (PRILOSEC) 40 MG CAPSULE    Take 1 capsule (40 mg total) by mouth daily.   SERTRALINE (ZOLOFT) 100 MG TABLET    TAKE 1 AND 1/2 TABLETS BY MOUTH AT BEDTIME   THIAMINE (VITAMIN B-1) 100 MG TABLET    Take 100 mg by mouth daily.   ZOLPIDEM (AMBIEN) 10 MG TABLET    Take 1 tablet (10  mg total) by mouth at bedtime.  Modified Medications   No medications on file  Discontinued Medications   INSULIN GLARGINE (TOUJEO SOLOSTAR) 300 UNIT/ML SOPN    Inject 36 Units into the skin daily at 6 (six) AM.   NEOMYCIN-POLYMYXIN-HYDROCORTISONE (CORTISPORIN) OTIC SOLUTION    Place 3 drops into the right ear 3 (three) times daily. Use for 5 days   OXYCODONE-ACETAMINOPHEN (PERCOCET/ROXICET) 5-325 MG TABLET    Take 2 tablets by mouth in the morning, and Take 2 tablets by mouth in the evening   SILDENAFIL (VIAGRA) 100 MG TABLET    Take 1 tablet (100 mg total) by mouth daily. as directed       Labs reviewed: Basic Metabolic Panel: Recent Labs    08/23/17 0957  NA 135  K 4.8  CL 102  CO2 26  GLUCOSE 208*  BUN 15  CREATININE 0.77  CALCIUM 9.4   Liver Function Tests: Recent Labs    08/23/17 0957  AST 60*  ALT 70*  BILITOT 0.7  PROT 7.8   No results for input(s): LIPASE, AMYLASE in the last 8760 hours. No results for input(s): AMMONIA in the last 8760 hours. CBC: Recent Labs    08/23/17 0957  WBC 6.5  HGB 13.6  HCT 41.4  MCV 92.0  PLT 114*   Lipid Panel: Recent Labs    08/23/17 0957  CHOL 127  HDL 46  LDLCALC 64  TRIG 86  CHOLHDL 2.8   TSH: No results for input(s): TSH in the last 8760 hours. A1C: Lab Results  Component Value Date   HGBA1C 8.9 (H) 08/23/2017     Assessment/Plan 1. Depression, recurrent ([place]) -ongoing, increase stressors with lack of job and unable to get insurance. Discussed counseling services but not interested at this time. Continues on zoloft 150 mg by mouth daily. Also taking buspar TID for anxiety.  2. Type 2 diabetes mellitus with diabetic neuropathy, with long-term current use of insulin ([place]) Uncontrolled, previously followed by endocrinology but unable to follow up with due to lack of insurance. Still getting medication through clinic in [place]. Continue on metformin 1000 mg by mouth twice daily.  -will increase toujeo  to 45 units daily at this time but also discussed  lifestyle modifications as he is eating a lot of snacks which are elevated blood sugars.  -Encouraged dietary compliance, routine foot care/monitoring and to keep up with diabetic eye exams through ophthalmology -discussed in details about long term adverse effects of uncontrolled blood sugar  - Hemoglobin A1c; Future  3. Insomnia, unspecified type -stable, not using ambien at this time.   4. Essential hypertension, benign -encouraged to check blood pressure at home. Pt with hx of MI s/p CABG. Reinforced importance of proper control of bp.   5. Morbid obesity due to excess calories ([facility]) -eating excessive calories. Encouraged him to make lifestyle changes to decrease calories and increase activity as tolerates.  Pt with obesity, DM, Hyperlipidemia, fatty liver, CAD  6. Hyperlipidemia LDL goal <70 No recent lab work. Continues on lipitor 80 mg daily but does not follow diet modifications. Again reinforced importance of diet  - Lipid Panel; Future  7. Coronary artery disease involving native coronary artery of native heart without angina pectoris Has not been able to follow up with cardiologist due to lack of insurance. Continues on asa and plavix. Stressed importance of lifestyle modification and proper control of diabetes, cholesterol  - CBC with Differential/Platelets; Future  8. Primary osteoarthritis involving multiple joints Ongoing pain, using mobic twice daily.   9. Elevated liver enzymes -fatty liver noted on prior ultrasound in 2017. Encouraged dietary changes. Pt does not drink alcohol  - COMPLETE METABOLIC PANEL WITH GFR; Future  10. Does not have health insurance Has not had any routine follow up, working to get disability and medicare due to multiple health issues.    Next appt: 3 months  Nilay Mangrum K. Harle Battiest  [facility] [phone]    Virtual Visit via Telephone Note  I connected  pt on 06/07/18 at  1:00 PM EDT by telephone and verified that I am speaking with the correct person using two identifiers.  Location: Patient: home Provider: [facility]    I discussed the limitations, risks, security and privacy concerns of performing an evaluation and management service by telephone and the availability of in person appointments. I also discussed with the patient that there may be a patient responsible charge related to this service. The patient expressed understanding and agreed to proceed.   I discussed the assessment and treatment plan with the patient. The patient was provided an opportunity to ask questions and all were answered. The patient agreed with the plan and demonstrated an understanding of the instructions.   The patient was advised to call back or seek an in-person evaluation if the symptoms worsen or if the condition fails to improve as anticipated.  I provided 30 minutes of non-face-to-face time during this encounter.  [employer]. Harle Battiest Avs printed and mailed

## 2018-06-07 NOTE — Patient Instructions (Addendum)
Increase toujeo to 45 units daily  It is important that you make changes in your diet to help with control of diabetes, cholesterol and blood pressure.   Also important for routine foot care/monitoring and to keep up with diabetic eye exams through ophthalmology   DASH Eating Plan DASH stands for "Dietary Approaches to Stop Hypertension." The DASH eating plan is a healthy eating plan that has been shown to reduce high blood pressure (hypertension). It may also reduce your risk for type 2 diabetes, heart disease, and stroke. The DASH eating plan may also help with weight loss. What are tips for following this plan?  General guidelines  Avoid eating more than 2,300 mg (milligrams) of salt (sodium) a day. If you have hypertension, you may need to reduce your sodium intake to 1,500 mg a day.  Limit alcohol intake to no more than 1 drink a day for nonpregnant women and 2 drinks a day for men. One drink equals 12 oz of beer, 5 oz of wine, or 1 oz of hard liquor.  Work with your health care provider to maintain a healthy body weight or to lose weight. Ask what an ideal weight is for you.  Get at least 30 minutes of exercise that causes your heart to beat faster (aerobic exercise) most days of the week. Activities may include walking, swimming, or biking.  Work with your health care provider or diet and nutrition specialist (dietitian) to adjust your eating plan to your individual calorie needs. Reading food labels   Check food labels for the amount of sodium per serving. Choose foods with less than 5 percent of the Daily Value of sodium. Generally, foods with less than 300 mg of sodium per serving fit into this eating plan.  To find whole grains, look for the word "whole" as the first word in the ingredient list. Shopping  Buy products labeled as "low-sodium" or "no salt added."  Buy fresh foods. Avoid canned foods and premade or frozen meals. Cooking  Avoid adding salt when cooking. Use  salt-free seasonings or herbs instead of table salt or sea salt. Check with your health care provider or pharmacist before using salt substitutes.  Do not fry foods. Cook foods using healthy methods such as baking, boiling, grilling, and broiling instead.  Cook with heart-healthy oils, such as olive, canola, soybean, or sunflower oil. Meal planning  Eat a balanced diet that includes: ? 5 or more servings of fruits and vegetables each day. At each meal, try to fill half of your plate with fruits and vegetables. ? Up to 6-8 servings of whole grains each day. ? Less than 6 oz of lean meat, poultry, or fish each day. A 3-oz serving of meat is about the same size as a deck of cards. One egg equals 1 oz. ? 2 servings of low-fat dairy each day. ? A serving of nuts, seeds, or beans 5 times each week. ? Heart-healthy fats. Healthy fats called Omega-3 fatty acids are found in foods such as flaxseeds and coldwater fish, like sardines, salmon, and mackerel.  Limit how much you eat of the following: ? Canned or prepackaged foods. ? Food that is high in trans fat, such as fried foods. ? Food that is high in saturated fat, such as fatty meat. ? Sweets, desserts, sugary drinks, and other foods with added sugar. ? Full-fat dairy products.  Do not salt foods before eating.  Try to eat at least 2 vegetarian meals each week.  Eat more  home-cooked food and less restaurant, buffet, and fast food.  When eating at a restaurant, ask that your food be prepared with less salt or no salt, if possible. What foods are recommended? The items listed may not be a complete list. Talk with your dietitian about what dietary choices are best for you. Grains Whole-grain or whole-wheat bread. Whole-grain or whole-wheat pasta. Brown rice. [place]. Bulgur. Whole-grain and low-sodium cereals. Pita bread. Low-fat, low-sodium crackers. Whole-wheat flour tortillas. Vegetables Fresh or frozen vegetables (raw, steamed,  roasted, or grilled). Low-sodium or reduced-sodium tomato and vegetable juice. Low-sodium or reduced-sodium tomato sauce and tomato paste. Low-sodium or reduced-sodium canned vegetables. Fruits All fresh, dried, or frozen fruit. Canned fruit in natural juice (without added sugar). Meat and other protein foods Skinless chicken or [place]. Ground chicken or [place]. Pork with fat trimmed off. Fish and seafood. Egg whites. Dried beans, peas, or lentils. Unsalted nuts, nut butters, and seeds. Unsalted canned beans. Lean cuts of beef with fat trimmed off. Low-sodium, lean deli meat. Dairy Low-fat (1%) or fat-free (skim) milk. Fat-free, low-fat, or reduced-fat cheeses. Nonfat, low-sodium ricotta or cottage cheese. Low-fat or nonfat yogurt. Low-fat, low-sodium cheese. Fats and oils Soft margarine without trans fats. Vegetable oil. Low-fat, reduced-fat, or light mayonnaise and salad dressings (reduced-sodium). Canola, safflower, olive, soybean, and sunflower oils. Avocado. Seasoning and other foods Herbs. Spices. Seasoning mixes without salt. Unsalted popcorn and pretzels. Fat-free sweets. What foods are not recommended? The items listed may not be a complete list. Talk with your dietitian about what dietary choices are best for you. Grains Baked goods made with fat, such as croissants, muffins, or some breads. Dry pasta or rice meal packs. Vegetables Creamed or fried vegetables. Vegetables in a cheese sauce. Regular canned vegetables (not low-sodium or reduced-sodium). Regular canned tomato sauce and paste (not low-sodium or reduced-sodium). Regular tomato and vegetable juice (not low-sodium or reduced-sodium). Angie Fava. Olives. Fruits Canned fruit in a light or heavy syrup. Fried fruit. Fruit in cream or butter sauce. Meat and other protein foods Fatty cuts of meat. Ribs. Fried meat. [place]ce Salines. Sausage. Bologna and other processed lunch meats. Salami. Fatback. Hotdogs. Bratwurst. Salted nuts and seeds. Canned  beans with added salt. Canned or smoked fish. Whole eggs or egg yolks. Chicken or [place] with skin. Dairy Whole or 2% milk, cream, and half-and-half. Whole or full-fat cream cheese. Whole-fat or sweetened yogurt. Full-fat cheese. Nondairy creamers. Whipped toppings. Processed cheese and cheese spreads. Fats and oils Butter. Stick margarine. Lard. Shortening. Ghee. Bacon fat. Tropical oils, such as coconut, palm kernel, or palm oil. Seasoning and other foods Salted popcorn and pretzels. Onion salt, garlic salt, seasoned salt, table salt, and sea salt. Worcestershire sauce. Tartar sauce. Barbecue sauce. Teriyaki sauce. Soy sauce, including reduced-sodium. Steak sauce. Canned and packaged gravies. Fish sauce. Oyster sauce. Cocktail sauce. Horseradish that you find on the shelf. Ketchup. Mustard. Meat flavorings and tenderizers. Bouillon cubes. Hot sauce and Tabasco sauce. Premade or packaged marinades. Premade or packaged taco seasonings. Relishes. Regular salad dressings. Where to find more information:  National Heart, Lung, and [place]: https://wilson-eaton.com/  American Heart Association: www.heart.org Summary  The DASH eating plan is a healthy eating plan that has been shown to reduce high blood pressure (hypertension). It may also reduce your risk for type 2 diabetes, heart disease, and stroke.  With the DASH eating plan, you should limit salt (sodium) intake to 2,300 mg a day. If you have hypertension, you may need to reduce your sodium intake to 1,500  mg a day.  When on the DASH eating plan, aim to eat more fresh fruits and vegetables, whole grains, lean proteins, low-fat dairy, and heart-healthy fats.  Work with your health care provider or diet and nutrition specialist (dietitian) to adjust your eating plan to your individual calorie needs. This information is not intended to replace advice given to you by your health care provider. Make sure you discuss any questions you have with your  health care provider. Document Released: 12/31/2010 Document Revised: 01/05/2016 Document Reviewed: 01/05/2016 Elsevier Interactive Patient Education  2019 [employer].

## 2018-06-08 ENCOUNTER — Telehealth: Payer: Self-pay | Admitting: *Deleted

## 2018-06-08 NOTE — Telephone Encounter (Signed)
Mr.Isensee,stated he will call us back,due to not working right now.

## 2018-06-15 ENCOUNTER — Other Ambulatory Visit: Payer: Self-pay

## 2018-06-15 DIAGNOSIS — R748 Abnormal levels of other serum enzymes: Secondary | ICD-10-CM

## 2018-06-15 DIAGNOSIS — Z794 Long term (current) use of insulin: Secondary | ICD-10-CM

## 2018-06-15 DIAGNOSIS — E785 Hyperlipidemia, unspecified: Secondary | ICD-10-CM

## 2018-06-15 DIAGNOSIS — E1140 Type 2 diabetes mellitus with diabetic neuropathy, unspecified: Secondary | ICD-10-CM

## 2018-06-15 DIAGNOSIS — I2510 Atherosclerotic heart disease of native coronary artery without angina pectoris: Secondary | ICD-10-CM

## 2018-06-16 ENCOUNTER — Other Ambulatory Visit: Payer: Self-pay

## 2018-06-16 DIAGNOSIS — R7309 Other abnormal glucose: Secondary | ICD-10-CM

## 2018-06-16 LAB — COMPLETE METABOLIC PANEL WITH GFR
AG Ratio: 1.2 (calc) (ref 1.0–2.5)
ALT: 39 U/L (ref 9–46)
AST: 46 U/L — ABNORMAL HIGH (ref 10–35)
Albumin: 3.7 g/dL (ref 3.6–5.1)
Alkaline phosphatase (APISO): 77 U/L (ref 35–144)
BUN: 16 mg/dL (ref 7–25)
CO2: 25 mmol/L (ref 20–32)
Calcium: 8.9 mg/dL (ref 8.6–10.3)
Chloride: 103 mmol/L (ref 98–110)
Creat: 0.99 mg/dL (ref 0.70–1.25)
GFR, Est African American: 95 mL/min/{1.73_m2} (ref >=60)
GFR, Est Non African American: 82 mL/min/{1.73_m2} (ref >=60)
Globulin: 3.2 g/dL (calc) (ref 1.9–3.7)
Glucose, Bld: 117 mg/dL — ABNORMAL HIGH (ref 65–99)
Potassium: 4.4 mmol/L (ref 3.5–5.3)
Sodium: 138 mmol/L (ref 135–146)
Total Bilirubin: 0.7 mg/dL (ref 0.2–1.2)
Total Protein: 6.9 g/dL (ref 6.1–8.1)

## 2018-06-16 LAB — CBC WITH DIFFERENTIAL/PLATELET
Absolute Monocytes: 458 cells/uL (ref 200–950)
Basophils Absolute: 42 cells/uL (ref 0–200)
Basophils Relative: 0.8 %
Eosinophils Absolute: 99 cells/uL (ref 15–500)
Eosinophils Relative: 1.9 %
HCT: 37.5 % — ABNORMAL LOW (ref 38.5–50.0)
Hemoglobin: 12.2 g/dL — ABNORMAL LOW (ref 13.2–17.1)
Lymphs Abs: 1243 cells/uL (ref 850–3900)
MCH: 29.2 pg (ref 27.0–33.0)
MCHC: 32.5 g/dL (ref 32.0–36.0)
MCV: 89.7 fL (ref 80.0–100.0)
MPV: 12.9 fL — ABNORMAL HIGH (ref 7.5–12.5)
Monocytes Relative: 8.8 %
Neutro Abs: 3359 cells/uL (ref 1500–7800)
Neutrophils Relative %: 64.6 %
Platelets: 78 10*3/uL — ABNORMAL LOW (ref 140–400)
RBC: 4.18 10*6/uL — ABNORMAL LOW (ref 4.20–5.80)
RDW: 15.1 % — ABNORMAL HIGH (ref 11.0–15.0)
Total Lymphocyte: 23.9 %
WBC: 5.2 10*3/uL (ref 3.8–10.8)

## 2018-06-16 LAB — LIPID PANEL
Cholesterol: 116 mg/dL (ref <200)
HDL: 35 mg/dL — ABNORMAL LOW (ref >=40)
LDL Cholesterol (Calc): 63 mg/dL (calc)
Non-HDL Cholesterol (Calc): 81 mg/dL (calc) (ref <130)
Total CHOL/HDL Ratio: 3.3 (calc) (ref <5.0)
Triglycerides: 92 mg/dL (ref <150)

## 2018-06-16 LAB — HEMOGLOBIN A1C
Hgb A1c MFr Bld: 8.8 % of total Hgb — ABNORMAL HIGH (ref <5.7)
Mean Plasma Glucose: 206 (calc)
eAG (mmol/L): 11.4 (calc)

## 2018-06-20 ENCOUNTER — Encounter: Payer: Self-pay | Admitting: Nurse Practitioner

## 2018-06-20 ENCOUNTER — Ambulatory Visit (INDEPENDENT_AMBULATORY_CARE_PROVIDER_SITE_OTHER): Payer: Self-pay | Admitting: Nurse Practitioner

## 2018-06-20 ENCOUNTER — Other Ambulatory Visit: Payer: Self-pay

## 2018-06-20 VITALS — BP 118/72 | HR 55 | Temp 98.2°F | Ht 66.0 in | Wt 263.0 lb

## 2018-06-20 DIAGNOSIS — E1165 Type 2 diabetes mellitus with hyperglycemia: Secondary | ICD-10-CM

## 2018-06-20 DIAGNOSIS — IMO0002 Reserved for concepts with insufficient information to code with codable children: Secondary | ICD-10-CM

## 2018-06-20 DIAGNOSIS — D696 Thrombocytopenia, unspecified: Secondary | ICD-10-CM

## 2018-06-20 DIAGNOSIS — E1129 Type 2 diabetes mellitus with other diabetic kidney complication: Secondary | ICD-10-CM

## 2018-06-20 NOTE — Progress Notes (Signed)
Careteam: Patient Care Team: [name], NP as PCP - General (Geriatric Medicine) Lorretta Harp, MD as Consulting Physician (Cardiology)  Advanced Directive information    No Known Allergies  Chief Complaint  Patient presents with  . Follow-up    Follow-up on low platelets. Lab discussed with patient on 06/16/2018 (copy printed) . Moderate fall risk   . Best Practice Recommendations    Patient would like to hold off on any best pratice recommendations due to no insurance      HPI: Patient is a 63 y.o. male seen in the office today due to low platelets. Reports he bruises easily will hit anything and have a large bruise. Did have a couple of bloody noses last week after he blew his nose.  No blood in urine that he is aware of. No dark tarry stools.  States he was coughing up slight abts of blood for a few days last week but this has since resolved.   Pt reports he has been on "excessive amounts" of aleve, tylenol, and ibufroin. Has been on these since September 2019, previously was on oxycodone due to chronic pain. In April he had been "heavy" for abt 2 weeks but then went down to taking tylenol 500 mg 2 at lunch time at supper 2 more tylenol and advil 2-3 200 mg tablets. Does not require medication in the morning but will use something in the middle of the night.  Will take aleve occasional as needed.   Was on antibiotic after tooth abscess for 2 weeks unsure of name of antibiotics    Review of Systems:  Review of Systems  Constitutional: Positive for malaise/fatigue. Negative for chills, fever and weight loss.  HENT: Negative for tinnitus.   Respiratory: Positive for hemoptysis. Negative for cough, sputum production and shortness of breath.   Cardiovascular: Positive for palpitations. Negative for chest pain and leg swelling.  Gastrointestinal: Negative for abdominal pain, blood in stool, constipation, diarrhea, heartburn and melena.  Genitourinary: Negative  for dysuria, frequency, hematuria and urgency.  Musculoskeletal: Positive for back pain, joint pain and myalgias. Negative for falls.  Skin: Negative.   Neurological: Positive for weakness. Negative for dizziness and headaches.  Endo/Heme/Allergies: Bruises/bleeds easily.  Psychiatric/Behavioral: Positive for depression. Negative for memory loss. The patient is nervous/anxious. The patient does not have insomnia.     Past Medical History:  Diagnosis Date  . Coronary artery disease   . MI (myocardial infarction) ([place]) 04/23/2007   inferior wall  . Morbid obesity ([place]) 06/26/2012  . S/P CABG x 3 07/04/2012   LIMA to LAD, SVG to D1, SVG to PDA, EVH via right thigh  . Sleep apnea   . Type II or unspecified type diabetes mellitus without mention of complication, not stated as uncontrolled   . Unspecified essential hypertension    Past Surgical History:  Procedure Laterality Date  . CORONARY ANGIOPLASTY WITH STENT PLACEMENT  04/23/2007   PCI and stenting of mid RCA - Dr Donnetta Hutching @ [facility]  . CORONARY ARTERY BYPASS GRAFT N/A 07/04/2012   Procedure: CORONARY ARTERY BYPASS GRAFTING (CABG);  Surgeon: Rexene Alberts, MD;  Location: [place];  Service: Open Heart Surgery;  Laterality: N/A;  x3 using right greater saphenous vein and left internal mammary.   . DENTAL SURGERY  04/2018   4 teeth removed  . INTRAOPERATIVE TRANSESOPHAGEAL ECHOCARDIOGRAM N/A 07/04/2012   Procedure: INTRAOPERATIVE TRANSESOPHAGEAL ECHOCARDIOGRAM;  Surgeon: Rexene Alberts, MD;  Location: [place];  Service: Open  Heart Surgery;  Laterality: N/A;  . LEFT HEART CATH AND CORS/GRAFTS ANGIOGRAPHY N/A 04/18/2017   Procedure: LEFT HEART CATH AND CORS/GRAFTS ANGIOGRAPHY;  Surgeon: Lorretta Harp, MD;  Location: [place] [facility];  Service: Cardiovascular;  Laterality: N/A;  . LEFT HEART CATHETERIZATION WITH CORONARY ANGIOGRAM N/A 06/25/2012   Procedure: LEFT HEART CATHETERIZATION WITH CORONARY ANGIOGRAM;  Surgeon: Lorretta Harp, MD;   Location: [facility];  Service: Cardiovascular;  Laterality: N/A;  . TOOTH EXTRACTION  04/2018   4 teeth pulled    Social History:   reports that he has never smoked. He has never used smokeless tobacco. He reports that he does not drink alcohol or use drugs.  Family History  Problem Relation Age of Onset  . Cancer Mother   . Diabetes Sister   . Diabetes Brother     Medications: Patient's Medications  New Prescriptions   No medications on file  Previous Medications   ALBUTEROL (PROVENTIL HFA;VENTOLIN HFA) 108 (90 BASE) MCG/ACT INHALER    Inhale 2 puffs into the lungs every 6 (six) hours as needed for wheezing or shortness of breath.   ASPIRIN 81 MG CHEWABLE TABLET    Chew 81 mg by mouth daily.    ATORVASTATIN (LIPITOR) 80 MG TABLET    Take 1 tablet (80 mg total) by mouth daily.   BLOOD GLUCOSE MONITORING SUPPL (CONTOUR NEXT EZ MONITOR) W/DEVICE KIT    Test blood sugar three times daily E11.22   BUSPIRONE (BUSPAR) 15 MG TABLET    TAKE 1 TABLET BY MOUTH THREE TIMES DAILY FOR ANXIETY   CLOPIDOGREL (PLAVIX) 75 MG TABLET    Take one tablet by mouth once daily   GLUCOSE BLOOD (BAYER CONTOUR NEXT TEST) TEST STRIP    Use as instructed   IBUPROFEN (ADVIL) 200 MG TABLET    Take 1 tablet (200 mg total) by mouth every 8 (eight) hours as needed for mild pain or moderate pain.   INSULIN GLARGINE, 1 UNIT DIAL, (TOUJEO SOLOSTAR) 300 UNIT/ML SOPN    Inject 38 Units into the skin daily.   INSULIN PEN NEEDLE 32G X 4 MM MISC    Use as Directed. Dx: E11.40   LISINOPRIL (PRINIVIL,ZESTRIL) 5 MG TABLET    TAKE 1 TABLET(5 MG) BY MOUTH DAILY   MELOXICAM (MOBIC) 7.5 MG TABLET    TAKE 1 TABLET(7.5 MG) BY MOUTH DAILY   METFORMIN (GLUCOPHAGE) 1000 MG TABLET    TAKE 1 TABLET(1000 MG) BY MOUTH TWICE DAILY   METOPROLOL TARTRATE (LOPRESSOR) 25 MG TABLET    Take 0.5 tablets (12.5 mg total) by mouth 2 (two) times daily.   OMEPRAZOLE (PRILOSEC) 40 MG CAPSULE    Take 1 capsule (40 mg total) by mouth daily.   SERTRALINE  (ZOLOFT) 100 MG TABLET    TAKE 1 AND 1/2 TABLETS BY MOUTH AT BEDTIME   THIAMINE (VITAMIN B-1) 100 MG TABLET    Take 100 mg by mouth daily.  Modified Medications   No medications on file  Discontinued Medications   No medications on file     Physical Exam:  Vitals:   06/20/18 0806  BP: 118/72  Pulse: (!) 55  Temp: 98.2 F (36.8 C)  TempSrc: Oral  SpO2: 97%  Weight: 263 lb (119.3 kg)  Height: '5\' 6"'  (1.676 m)   Body mass index is 42.45 kg/m.  Physical Exam Constitutional:      Appearance: He is well-developed.  HENT:     Head: Normocephalic and atraumatic.  Eyes:  General: No scleral icterus.    Pupils: Pupils are equal, round, and reactive to light.  Neck:     Musculoskeletal: Neck supple.     Thyroid: No thyromegaly.     Vascular: No carotid bruit.  Cardiovascular:     Rate and Rhythm: Regular rhythm. Bradycardia present.     Heart sounds: Murmur present. Systolic murmur present with a grade of 1/6. No friction rub. No gallop.      Comments: no distal LE swelling. No calf TTP Pulmonary:     Effort: Pulmonary effort is normal.     Breath sounds: Normal breath sounds. No wheezing or rales.  Chest:     Chest wall: No tenderness.  Abdominal:     General: Bowel sounds are normal. There is no distension or abdominal bruit.     Palpations: Abdomen is soft. There is no hepatomegaly, mass or pulsatile mass.     Tenderness: There is no abdominal tenderness. There is no guarding or rebound.     Comments: obese  Lymphadenopathy:     Cervical: No cervical adenopathy.  Skin:    General: Skin is warm and dry.     Findings: No rash.  Neurological:     Mental Status: He is alert and oriented to person, place, and time.     Deep Tendon Reflexes: Reflexes are normal and symmetric.  Psychiatric:        Behavior: Behavior normal.        Thought Content: Thought content normal.        Judgment: Judgment normal.     Labs reviewed: Basic Metabolic Panel: Recent Labs     08/23/17 0957 06/15/18 0822  NA 135 138  K 4.8 4.4  CL 102 103  CO2 26 25  GLUCOSE 208* 117*  BUN 15 16  CREATININE 0.77 0.99  CALCIUM 9.4 8.9   Liver Function Tests: Recent Labs    08/23/17 0957 06/15/18 0822  AST 60* 46*  ALT 70* 39  BILITOT 0.7 0.7  PROT 7.8 6.9   No results for input(s): LIPASE, AMYLASE in the last 8760 hours. No results for input(s): AMMONIA in the last 8760 hours. CBC: Recent Labs    08/23/17 0957 06/15/18 0822  WBC 6.5 5.2  NEUTROABS  --  3,359  HGB 13.6 12.2*  HCT 41.4 37.5*  MCV 92.0 89.7  PLT 114* 78*   Lipid Panel: Recent Labs    08/23/17 0957 06/15/18 0822  CHOL 127 116  HDL 46 35*  LDLCALC 64 63  TRIG 86 92  CHOLHDL 2.8 3.3   TSH: No results for input(s): TSH in the last 8760 hours. A1C: Lab Results  Component Value Date   HGBA1C 8.8 (H) 06/15/2018     Assessment/Plan 1. Anemia with low platelet count (HCC) -platelets have been low in the past but now worse than previously. With symptoms such as anemia, easily bleeding and bruising and blood tinged sputum noted will refer to hematology, will also repeat blood work today. Discussed minimizing use of tylenol and NSAIDS as well. He does take omeprazole which can effect platelets but he can not go without this.   - CBC (no diff) - Pathologist smear review - Ambulatory referral to Hematology  2. Diabetes mellitus with renal manifestations, uncontrolled ([place]) -A1c not at goal. Has been counseled on this. He has increased insulin and discussed need for diet modifications. Follow up A1c scheduled.  Next appt: 09/18/2018 Carlos American. Harle Battiest  [facility] [phone]

## 2018-06-20 NOTE — Patient Instructions (Addendum)
To decrease use of tylenol and ibuprofen   To Stop omeprazole   To change 3 month follow up until AFTER your blood work    Diabetes Mellitus and Nutrition, Adult When you have diabetes (diabetes mellitus), it is very important to have healthy eating habits because your blood sugar (glucose) levels are greatly affected by what you eat and drink. Eating healthy foods in the appropriate amounts, at about the same times every day, can help you:  Control your blood glucose.  Lower your risk of heart disease.  Improve your blood pressure.  Reach or maintain a healthy weight. Every person with diabetes is different, and each person has different needs for a meal plan. Your health care provider may recommend that you work with a diet and nutrition specialist (dietitian) to make a meal plan that is best for you. Your meal plan may vary depending on factors such as:  The calories you need.  The medicines you take.  Your weight.  Your blood glucose, blood pressure, and cholesterol levels.  Your activity level.  Other health conditions you have, such as heart or kidney disease. How do carbohydrates affect me? Carbohydrates, also called carbs, affect your blood glucose level more than any other type of food. Eating carbs naturally raises the amount of glucose in your blood. Carb counting is a method for keeping track of how many carbs you eat. Counting carbs is important to keep your blood glucose at a healthy level, especially if you use insulin or take certain oral diabetes medicines. It is important to know how many carbs you can safely have in each meal. This is different for every person. Your dietitian can help you calculate how many carbs you should have at each meal and for each snack. Foods that contain carbs include:  Bread, cereal, rice, pasta, and crackers.  Potatoes and corn.  Peas, beans, and lentils.  Milk and yogurt.  Fruit and juice.  Desserts, such as cakes,  cookies, ice cream, and candy. How does alcohol affect me? Alcohol can cause a sudden decrease in blood glucose (hypoglycemia), especially if you use insulin or take certain oral diabetes medicines. Hypoglycemia can be a life-threatening condition. Symptoms of hypoglycemia (sleepiness, dizziness, and confusion) are similar to symptoms of having too much alcohol. If your health care provider says that alcohol is safe for you, follow these guidelines:  Limit alcohol intake to no more than 1 drink per day for nonpregnant women and 2 drinks per day for men. One drink equals 12 oz of beer, 5 oz of wine, or 1 oz of hard liquor.  Do not drink on an empty stomach.  Keep yourself hydrated with water, diet soda, or unsweetened iced tea.  Keep in mind that regular soda, juice, and other mixers may contain a lot of sugar and must be counted as carbs. What are tips for following this plan?  Reading food labels  Start by checking the serving size on the "Nutrition Facts" label of packaged foods and drinks. The amount of calories, carbs, fats, and other nutrients listed on the label is based on one serving of the item. Many items contain more than one serving per package.  Check the total grams (g) of carbs in one serving. You can calculate the number of servings of carbs in one serving by dividing the total carbs by 15. For example, if a food has 30 g of total carbs, it would be equal to 2 servings of carbs.  Check the  number of grams (g) of saturated and trans fats in one serving. Choose foods that have low or no amount of these fats.  Check the number of milligrams (mg) of salt (sodium) in one serving. Most people should limit total sodium intake to less than 2,300 mg per day.  Always check the nutrition information of foods labeled as "low-fat" or "nonfat". These foods may be higher in added sugar or refined carbs and should be avoided.  Talk to your dietitian to identify your daily goals for  nutrients listed on the label. Shopping  Avoid buying canned, premade, or processed foods. These foods tend to be high in fat, sodium, and added sugar.  Shop around the outside edge of the grocery store. This includes fresh fruits and vegetables, bulk grains, fresh meats, and fresh dairy. Cooking  Use low-heat cooking methods, such as baking, instead of high-heat cooking methods like deep frying.  Cook using healthy oils, such as olive, canola, or sunflower oil.  Avoid cooking with butter, cream, or high-fat meats. Meal planning  Eat meals and snacks regularly, preferably at the same times every day. Avoid going long periods of time without eating.  Eat foods high in fiber, such as fresh fruits, vegetables, beans, and whole grains. Talk to your dietitian about how many servings of carbs you can eat at each meal.  Eat 4-6 ounces (oz) of lean protein each day, such as lean meat, chicken, fish, eggs, or tofu. One oz of lean protein is equal to: ? 1 oz of meat, chicken, or fish. ? 1 egg. ?  cup of tofu.  Eat some foods each day that contain healthy fats, such as avocado, nuts, seeds, and fish. Lifestyle  Check your blood glucose regularly.  Exercise regularly as told by your health care provider. This may include: ? 150 minutes of moderate-intensity or vigorous-intensity exercise each week. This could be brisk walking, biking, or water aerobics. ? Stretching and doing strength exercises, such as yoga or weightlifting, at least 2 times a week.  Take medicines as told by your health care provider.  Do not use any products that contain nicotine or tobacco, such as cigarettes and e-cigarettes. If you need help quitting, ask your health care provider.  Work with a Veterinary surgeon or diabetes educator to identify strategies to manage stress and any emotional and social challenges. Questions to ask a health care provider  Do I need to meet with a diabetes educator?  Do I need to meet with a  dietitian?  What number can I call if I have questions?  When are the best times to check my blood glucose? Where to find more information:  American Diabetes Association: diabetes.org  Academy of Nutrition and Dietetics: www.eatright.[employer] of Diabetes and Digestive and Kidney Diseases (NIH): CarFlippers.tn Summary  A healthy meal plan will help you control your blood glucose and maintain a healthy lifestyle.  Working with a diet and nutrition specialist (dietitian) can help you make a meal plan that is best for you.  Keep in mind that carbohydrates (carbs) and alcohol have immediate effects on your blood glucose levels. It is important to count carbs and to use alcohol carefully. This information is not intended to replace advice given to you by your health care provider. Make sure you discuss any questions you have with your health care provider. Document Released: 10/08/2004 Document Revised: 08/11/2016 Document Reviewed: 02/16/2016 Elsevier Interactive Patient Education  2019 [employer].

## 2018-06-21 LAB — CBC
HCT: 39.4 % (ref 38.5–50.0)
Hemoglobin: 12.8 g/dL — ABNORMAL LOW (ref 13.2–17.1)
MCH: 29.7 pg (ref 27.0–33.0)
MCHC: 32.5 g/dL (ref 32.0–36.0)
MCV: 91.4 fL (ref 80.0–100.0)
MPV: 12.7 fL — ABNORMAL HIGH (ref 7.5–12.5)
Platelets: 90 10*3/uL — ABNORMAL LOW (ref 140–400)
RBC: 4.31 10*6/uL (ref 4.20–5.80)
RDW: 15.2 % — ABNORMAL HIGH (ref 11.0–15.0)
WBC: 6 10*3/uL (ref 3.8–10.8)

## 2018-06-21 LAB — PATHOLOGIST SMEAR REVIEW

## 2018-06-22 DIAGNOSIS — Z029 Encounter for administrative examinations, unspecified: Secondary | ICD-10-CM

## 2018-06-29 ENCOUNTER — Telehealth: Payer: Self-pay | Admitting: Family

## 2018-07-13 ENCOUNTER — Other Ambulatory Visit: Payer: Self-pay | Admitting: Family

## 2018-07-13 DIAGNOSIS — R58 Hemorrhage, not elsewhere classified: Secondary | ICD-10-CM

## 2018-07-13 DIAGNOSIS — D649 Anemia, unspecified: Secondary | ICD-10-CM

## 2018-07-13 DIAGNOSIS — D696 Thrombocytopenia, unspecified: Secondary | ICD-10-CM

## 2018-07-14 ENCOUNTER — Encounter: Payer: Self-pay | Admitting: Family

## 2018-07-14 ENCOUNTER — Other Ambulatory Visit: Payer: Self-pay

## 2018-07-14 ENCOUNTER — Other Ambulatory Visit: Payer: Self-pay | Admitting: Family

## 2018-07-14 ENCOUNTER — Inpatient Hospital Stay: Payer: Self-pay | Attending: Family | Admitting: Family

## 2018-07-14 ENCOUNTER — Inpatient Hospital Stay: Payer: Self-pay

## 2018-07-14 VITALS — BP 140/83 | HR 57 | Temp 98.0°F | Resp 19 | Ht 66.0 in | Wt 267.0 lb

## 2018-07-14 DIAGNOSIS — R58 Hemorrhage, not elsewhere classified: Secondary | ICD-10-CM

## 2018-07-14 DIAGNOSIS — I252 Old myocardial infarction: Secondary | ICD-10-CM | POA: Insufficient documentation

## 2018-07-14 DIAGNOSIS — Z79899 Other long term (current) drug therapy: Secondary | ICD-10-CM | POA: Insufficient documentation

## 2018-07-14 DIAGNOSIS — F329 Major depressive disorder, single episode, unspecified: Secondary | ICD-10-CM | POA: Insufficient documentation

## 2018-07-14 DIAGNOSIS — D649 Anemia, unspecified: Secondary | ICD-10-CM | POA: Insufficient documentation

## 2018-07-14 DIAGNOSIS — D508 Other iron deficiency anemias: Secondary | ICD-10-CM

## 2018-07-14 DIAGNOSIS — I10 Essential (primary) hypertension: Secondary | ICD-10-CM | POA: Insufficient documentation

## 2018-07-14 DIAGNOSIS — Z800 Family history of malignant neoplasm of digestive organs: Secondary | ICD-10-CM | POA: Insufficient documentation

## 2018-07-14 DIAGNOSIS — Z794 Long term (current) use of insulin: Secondary | ICD-10-CM | POA: Insufficient documentation

## 2018-07-14 DIAGNOSIS — Z7982 Long term (current) use of aspirin: Secondary | ICD-10-CM | POA: Insufficient documentation

## 2018-07-14 DIAGNOSIS — D509 Iron deficiency anemia, unspecified: Secondary | ICD-10-CM | POA: Insufficient documentation

## 2018-07-14 DIAGNOSIS — E1165 Type 2 diabetes mellitus with hyperglycemia: Secondary | ICD-10-CM | POA: Insufficient documentation

## 2018-07-14 DIAGNOSIS — Z791 Long term (current) use of non-steroidal anti-inflammatories (NSAID): Secondary | ICD-10-CM | POA: Insufficient documentation

## 2018-07-14 DIAGNOSIS — Z7902 Long term (current) use of antithrombotics/antiplatelets: Secondary | ICD-10-CM | POA: Insufficient documentation

## 2018-07-14 DIAGNOSIS — D696 Thrombocytopenia, unspecified: Secondary | ICD-10-CM | POA: Insufficient documentation

## 2018-07-14 DIAGNOSIS — G4730 Sleep apnea, unspecified: Secondary | ICD-10-CM | POA: Insufficient documentation

## 2018-07-14 LAB — CMP (CANCER CENTER ONLY)
ALT: 48 U/L — ABNORMAL HIGH (ref 0–44)
AST: 44 U/L — ABNORMAL HIGH (ref 15–41)
Albumin: 3.9 g/dL (ref 3.5–5.0)
Alkaline Phosphatase: 95 U/L (ref 38–126)
Anion gap: 6 (ref 5–15)
BUN: 14 mg/dL (ref 8–23)
CO2: 29 mmol/L (ref 22–32)
Calcium: 9.6 mg/dL (ref 8.9–10.3)
Chloride: 102 mmol/L (ref 98–111)
Creatinine: 0.85 mg/dL (ref 0.61–1.24)
GFR, Est AFR Am: >60 mL/min (ref >60)
GFR, Estimated: >60 mL/min (ref >60)
Glucose, Bld: 196 mg/dL — ABNORMAL HIGH (ref 70–99)
Potassium: 4.5 mmol/L (ref 3.5–5.1)
Sodium: 137 mmol/L (ref 135–145)
Total Bilirubin: 0.6 mg/dL (ref 0.3–1.2)
Total Protein: 7.2 g/dL (ref 6.5–8.1)

## 2018-07-14 LAB — IRON AND TIBC
Iron: 64 ug/dL (ref 42–163)
Saturation Ratios: 14 % — ABNORMAL LOW (ref 20–55)
TIBC: 458 ug/dL — ABNORMAL HIGH (ref 202–409)
UIBC: 393 ug/dL — ABNORMAL HIGH (ref 117–376)

## 2018-07-14 LAB — CBC WITH DIFFERENTIAL (CANCER CENTER ONLY)
Abs Immature Granulocytes: 0.01 10*3/uL (ref 0.00–0.07)
Basophils Absolute: 0 10*3/uL (ref 0.0–0.1)
Basophils Relative: 1 %
Eosinophils Absolute: 0.1 10*3/uL (ref 0.0–0.5)
Eosinophils Relative: 2 %
HCT: 37.5 % — ABNORMAL LOW (ref 39.0–52.0)
Hemoglobin: 12.2 g/dL — ABNORMAL LOW (ref 13.0–17.0)
Immature Granulocytes: 0 %
Lymphocytes Relative: 24 %
Lymphs Abs: 1.2 10*3/uL (ref 0.7–4.0)
MCH: 29.7 pg (ref 26.0–34.0)
MCHC: 32.5 g/dL (ref 30.0–36.0)
MCV: 91.2 fL (ref 80.0–100.0)
Monocytes Absolute: 0.4 10*3/uL (ref 0.1–1.0)
Monocytes Relative: 9 %
Neutro Abs: 3.2 10*3/uL (ref 1.7–7.7)
Neutrophils Relative %: 64 %
Platelet Count: 81 10*3/uL — ABNORMAL LOW (ref 150–400)
RBC: 4.11 MIL/uL — ABNORMAL LOW (ref 4.22–5.81)
RDW: 15.9 % — ABNORMAL HIGH (ref 11.5–15.5)
WBC Count: 5 10*3/uL (ref 4.0–10.5)
nRBC: 0 % (ref 0.0–0.2)

## 2018-07-14 LAB — RETICULOCYTES
Immature Retic Fract: 11.2 % (ref 2.3–15.9)
RBC.: 4.14 MIL/uL — ABNORMAL LOW (ref 4.22–5.81)
Retic Count, Absolute: 46.4 10*3/uL (ref 19.0–186.0)
Retic Ct Pct: 1.1 % (ref 0.4–3.1)

## 2018-07-14 LAB — PLATELET BY CITRATE

## 2018-07-14 LAB — SAVE SMEAR(SSMR), FOR PROVIDER SLIDE REVIEW

## 2018-07-14 LAB — PROTIME-INR
INR: 1 (ref 0.8–1.2)
Prothrombin Time: 13.5 seconds (ref 11.4–15.2)

## 2018-07-14 LAB — FERRITIN: Ferritin: 11 ng/mL — ABNORMAL LOW (ref 24–336)

## 2018-07-14 LAB — LACTATE DEHYDROGENASE: LDH: 156 U/L (ref 98–192)

## 2018-07-14 LAB — APTT: aPTT: 26 seconds (ref 24–36)

## 2018-07-14 NOTE — Progress Notes (Signed)
Hematology/Oncology Consultation   Name: Barry [name]      MRN: [id number]    Location: Room/bed info not found  Date: 07/14/2018 Time:8:52 AM   REFERRING PHYSICIAN: Sherrie Mustache, NP  REASON FOR CONSULT: Anemia and low platelet count   DIAGNOSIS: Anemia and thrombocytopenia   HISTORY OF PRESENT ILLNESS: Barry [name] is a very pleasant 63 yo caucasian gentleman with history of mildly low platelet count over the last 6 years that has dipped below 100 over the last year. Today's count is 81.  He has had very mild anemia and Hgb is stable at 12.2.  He denies having any issues with bleeding.  He is symptomatic with fatigue, weakness, SOB with exertion, headaches and palpitations.  He has poorly controlled diabetes and states that his BG stay in the 200's. His Hgb A1c in May was 8.8. He states that he enjoys snacking at night.  His last abdominal US was in 2017 and showed a fatty liver.  He has some abdominal bloating at times.  No fever, chills, n/v, cough, rash, dizziness, chest pain, abdominal pain or changes in bowel or bladder habits.  Spleen is still in.  He states that he has had 2 heart attacks and triple bypass with the second. He is on Plavix and aspirin daily.  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 He has a cataract in the left eye and is scheduled for surgery in July.  He states that he has swelling off and on in his hands but no where else.  No tenderness, numbness or tingling in his extremities at this time.  He states that his legs will ache at night.  No lymphadenopathy noted on his exam.  He has maintained a good appetite and is staying well hydrated. He describes his weight as stable.  He does not smoke or drink alcoholic beverages.  No family history of anemia or thrombocytopenia. No liver failure.  NO personal history of cancer. His mother and sister had unknown primaries and he had 2 brothers with colon cancer.  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 This has been hard for him. He misses the activity.  He states that has become depressed with the fatigue and inability to be active.  He cares for his ex-wife who had a stroke and associated paralysis.   ROS: All other 10 point review of systems is negative.   PAST MEDICAL HISTORY:   Past Medical History:  Diagnosis Date  . Coronary artery disease   . MI (myocardial infarction) ([place]) 04/23/2007   inferior wall  . Morbid obesity ([place]) 06/26/2012  . S/P CABG x 3 07/04/2012   LIMA to LAD, SVG to D1, SVG to PDA, EVH via right thigh  . Sleep apnea   . Type II or unspecified type diabetes mellitus without mention of complication, not stated as uncontrolled   . Unspecified essential hypertension     ALLERGIES: No Known Allergies    MEDICATIONS:  Current Outpatient Medications on File Prior to Visit  Medication Sig Dispense Refill  . albuterol (PROVENTIL HFA;VENTOLIN HFA) 108 (90 Base) MCG/ACT inhaler Inhale 2 puffs into the lungs every 6 (six) hours as needed for wheezing or shortness of breath. 1 Inhaler 3  . aspirin 81 MG chewable tablet Chew 81 mg by mouth daily.     Marland Kitchen atorvastatin (LIPITOR) 80 MG tablet Take 1  tablet (80 mg total) by mouth daily. 90 tablet 1  . Blood Glucose Monitoring Suppl (CONTOUR NEXT EZ MONITOR) w/Device KIT Test blood sugar three times daily E11.22 1 kit 12  . busPIRone (BUSPAR) 15 MG tablet TAKE 1 TABLET BY MOUTH THREE TIMES DAILY FOR ANXIETY 90 tablet 3  . clopidogrel (PLAVIX) 75 MG tablet Take one tablet by mouth once daily 90 tablet 1  . glucose blood (BAYER CONTOUR NEXT TEST) test strip Use as instructed 100 each 12  . ibuprofen (ADVIL) 200 MG tablet Take 1 tablet (200 mg total) by mouth every 8 (eight) hours as needed for mild pain or moderate pain. 30 tablet 0  . Insulin Glargine, 1 Unit Dial, (TOUJEO SOLOSTAR) 300 UNIT/ML SOPN Inject 38 Units into the skin daily.    . Insulin Pen Needle 32G X 4 MM MISC  Use as Directed. Dx: E11.40 100 each 11  . lisinopril (PRINIVIL,ZESTRIL) 5 MG tablet TAKE 1 TABLET(5 MG) BY MOUTH DAILY 90 tablet 2  . meloxicam (MOBIC) 7.5 MG tablet TAKE 1 TABLET(7.5 MG) BY MOUTH DAILY 30 tablet 3  . metFORMIN (GLUCOPHAGE) 1000 MG tablet TAKE 1 TABLET(1000 MG) BY MOUTH TWICE DAILY 180 tablet 1  . metoprolol tartrate (LOPRESSOR) 25 MG tablet Take 0.5 tablets (12.5 mg total) by mouth 2 (two) times daily. 90 tablet 1  . omeprazole (PRILOSEC) 40 MG capsule Take 1 capsule (40 mg total) by mouth daily. 90 capsule 1  . sertraline (ZOLOFT) 100 MG tablet TAKE 1 AND 1/2 TABLETS BY MOUTH AT BEDTIME 135 tablet 1  . thiamine (VITAMIN B-1) 100 MG tablet Take 100 mg by mouth daily.     No current facility-administered medications on file prior to visit.      PAST SURGICAL HISTORY Past Surgical History:  Procedure Laterality Date  . CORONARY ANGIOPLASTY WITH STENT PLACEMENT  04/23/2007   PCI and stenting of mid RCA - Dr Donnetta Hutching @ [facility]  . CORONARY ARTERY BYPASS GRAFT N/A 07/04/2012   Procedure: CORONARY ARTERY BYPASS GRAFTING (CABG);  Surgeon: Rexene Alberts, MD;  Location: [facility];  Service: Open Heart Surgery;  Laterality: N/A;  x3 using right greater saphenous vein and left internal mammary.   . DENTAL SURGERY  04/2018   4 teeth removed  . INTRAOPERATIVE TRANSESOPHAGEAL ECHOCARDIOGRAM N/A 07/04/2012   Procedure: INTRAOPERATIVE TRANSESOPHAGEAL ECHOCARDIOGRAM;  Surgeon: Rexene Alberts, MD;  Location: [facility];  Service: Open Heart Surgery;  Laterality: N/A;  . LEFT HEART CATH AND CORS/GRAFTS ANGIOGRAPHY N/A 04/18/2017   Procedure: LEFT HEART CATH AND CORS/GRAFTS ANGIOGRAPHY;  Surgeon: Lorretta Harp, MD;  Location: [facility];  Service: Cardiovascular;  Laterality: N/A;  . LEFT HEART CATHETERIZATION WITH CORONARY ANGIOGRAM N/A 06/25/2012   Procedure: LEFT HEART CATHETERIZATION WITH CORONARY ANGIOGRAM;  Surgeon: Lorretta Harp, MD;  Location: [facility] C[facility];  Service: Cardiovascular;   Laterality: N/A;  . TOOTH EXTRACTION  04/2018   4 teeth pulled     FAMILY HISTORY: Family History  Problem Relation Age of Onset  . Cancer Mother   . Diabetes Sister   . Diabetes Brother     SOCIAL HISTORY:  reports that he has never smoked. He has never used smokeless tobacco. He reports that he does not drink alcohol or use drugs.  PERFORMANCE STATUS: The patient's performance status is 1 - Symptomatic but completely ambulatory  PHYSICAL EXAM: Most Recent Vital Signs: There were no vitals taken for this visit. BP 140/83 (BP Location: Right Arm, Patient Position:  Sitting)   Pulse (!) 57   Temp 98 F (36.7 C) (Oral)   Resp 19   Ht _0  (1.676 m)   Wt 267 lb (121.1 kg)   SpO2 99%   BMI 43.09 kg/m   General Appearance:    Alert, cooperative, no distress, appears stated age  Head:    Normocephalic, without obvious abnormality, atraumatic  Eyes:    PERRL, conjunctiva/corneas clear, EOM's intact, fundi    benign, both eyes             Throat:   Lips, mucosa, and tongue normal; teeth and gums normal  Neck:   Supple, symmetrical, trachea midline, no adenopathy;       thyroid:  No enlargement/tenderness/nodules; no carotid   bruit or JVD  Back:     Symmetric, no curvature, ROM normal, no CVA tenderness  Lungs:     Clear to auscultation bilaterally, respirations unlabored  Chest wall:    No tenderness or deformity  Heart:    Regular rate and rhythm, S1 and S2 normal, no murmur, rub   or gallop  Abdomen:     Soft, non-tender, bowel sounds active all four quadrants,    no masses, no organomegaly        Extremities:   Extremities normal, atraumatic, no cyanosis or edema  Pulses:   2+ and symmetric all extremities  Skin:   Skin color, texture, turgor normal, no rashes or lesions  Lymph nodes:   Cervical, supraclavicular, and axillary nodes normal  Neurologic:   CNII-XII intact. Normal strength, sensation and reflexes      throughout    LABORATORY DATA:  Results for  orders placed or performed in visit on 07/14/18 (from the past 48 hour(s))  Save Smear (SSMR)     Status: None   Collection Time: 07/14/18  8:12 AM  Result Value Ref Range   Smear Review SMEAR STAINED AND AVAILABLE FOR REVIEW     Comment: Performed at [facility] at [facility], [address] [place], [place] [address]  Platelet by Citrate     Status: None   Collection Time: 07/14/18  8:12 AM  Result Value Ref Range   Platelet CT in [place] Platelet count consistent with citrate.     Comment: Performed at [facility] at [facility], [address] [place],  [address]  CBC with Differential ([place] Only)     Status: Abnormal   Collection Time: 07/14/18  8:12 AM  Result Value Ref Range   WBC Count 5.0 4.0 - 10.5 K/uL   RBC 4.11 (L) 4.22 - 5.81 MIL/uL   Hemoglobin 12.2 (L) 13.0 - 17.0 g/dL   HCT 37.5 (L) 39.0 - 52.0 %   MCV 91.2 80.0 - 100.0 fL   MCH 29.7 26.0 - 34.0 pg   MCHC 32.5 30.0 - 36.0 g/dL   RDW 15.9 (H) 11.5 - 15.5 %   Platelet Count 81 (L) 150 - 400 K/uL    Comment: Platelet count consistent with citrate. PLATELET COUNT CONFIRMED BY SMEAR    nRBC 0.0 0.0 - 0.2 %   Neutrophils Relative % 64 %   Neutro Abs 3.2 1.7 - 7.7 K/uL   Lymphocytes Relative 24 %   Lymphs Abs 1.2 0.7 - 4.0 K/uL   Monocytes Relative 9 %   Monocytes Absolute 0.4 0.1 - 1.0 K/uL   Eosinophils Relative 2 %   Eosinophils Absolute 0.1 0.0 -  0.5 K/uL   Basophils Relative 1 %   Basophils Absolute 0.0 0.0 - 0.1 K/uL   Immature Granulocytes 0 %   Abs Immature Granulocytes 0.01 0.00 - 0.07 K/uL    Comment: Performed at [facility] Lab at [facility], [address], [place], [place] [address]  Reticulocytes     Status: Abnormal   Collection Time: 07/14/18  8:13 AM  Result Value Ref Range   Retic Ct Pct 1.1 0.4 - 3.1 %   RBC. 4.14 (L) 4.22 - 5.81 MIL/uL   Retic Count, Absolute 46.4 19.0 - 186.0 K/uL   Immature  Retic Fract 11.2 2.3 - 15.9 %    Comment: Performed at [facility] at [facility], [address] [place], [place] [address]      RADIOGRAPHY: No results found.     PATHOLOGY: None  ASSESSMENT/PLAN: Mr. Mcnerney is a pleasant 63 yo caucasian gentleman with history of anemia and thrombocytopenia. He has uncontrolled diabetes and is on quite a few medications which are essential but may contribute to the thrombocytopenia.  We will see what his lab work shows.  We will also get an US of the abdomen to evaluate both the liver and the spleen.  We will continue to follow along with him and plan to see him back in another 3 months.   All questions were answered and he is in agreement with the plan. He will contact our office with any questions or concerns. We can certainly see him sooner if needed.   He was discussed with and also seen by Dr. Marin Olp and he is in agreement with the aforementioned.   Laverna Peace, NP     Addendum: I saw and examined Mr. Shelburne with Judson Roch.  I agree with the above assessment.  I looked at his blood smear under the microscope.  His platelets are on the low side.  He has good granulation of the platelets.  His red cells and white cells looked okay.  I just wonder if he may not have some element of splenomegaly.  I know we cannot feel his spleen on exam.  I think would be worthwhile to get an ultrasound of his spleen.  I suspect he probably has hepatic steatosis.  He has poorly controlled diabetes.  He is on quite a few medications.  Is possible 1 of the medications could be causing the thrombocytopenia.  However, I would not change or stop any medications for right now.  He is totally asymptomatic with the thrombocytopenia.  There is thrombocytopenia is slowly progressive.  His platelet count was pretty much normal back in 2018.  I do not think a bone marrow test is necessary right now.  We will have to see what the lab work looks  like.  We will see what the ultrasound looks like.  We will get him back in about 3 months.  We spent about 45 minutes with him today.  We reviewed the lab work.  We answered all of his questions.  We counseled him about calling us if there is any bleeding or bruising.  I know he is on Plavix.  I still think this would be okay for right now.  Lattie Haw, MD

## 2018-07-17 LAB — ERYTHROPOIETIN: Erythropoietin: 57 m[IU]/mL — ABNORMAL HIGH (ref 2.6–18.5)

## 2018-07-21 ENCOUNTER — Ambulatory Visit (HOSPITAL_BASED_OUTPATIENT_CLINIC_OR_DEPARTMENT_OTHER)
Admission: RE | Admit: 2018-07-21 | Discharge: 2018-07-21 | Disposition: A | Discharge status: Home / Self Care | Payer: Self-pay | Source: Ambulatory Visit | Attending: Family | Admitting: Family

## 2018-07-21 ENCOUNTER — Other Ambulatory Visit: Payer: Self-pay

## 2018-07-21 DIAGNOSIS — D696 Thrombocytopenia, unspecified: Secondary | ICD-10-CM | POA: Insufficient documentation

## 2018-07-21 DIAGNOSIS — D649 Anemia, unspecified: Secondary | ICD-10-CM | POA: Insufficient documentation

## 2018-07-25 ENCOUNTER — Telehealth: Payer: Self-pay | Admitting: Family

## 2018-07-25 NOTE — Telephone Encounter (Signed)
I spoke with Ms. Sane and went over his abdominal US in detail. No questions at this time. We will plan to see him in September for follow-up.

## 2018-08-25 IMAGING — DX DG CHEST 2V
2 series · 2 of 2 positions shown · non-contrast
Comparison: Chest CT 02/07/2015, chest x-ray 01/15/2015

CLINICAL DATA: Atypical chest pain.  Sarcoid

EXAM:
CHEST - 2 VIEW

[dg chest 2 view (1 of 2)]
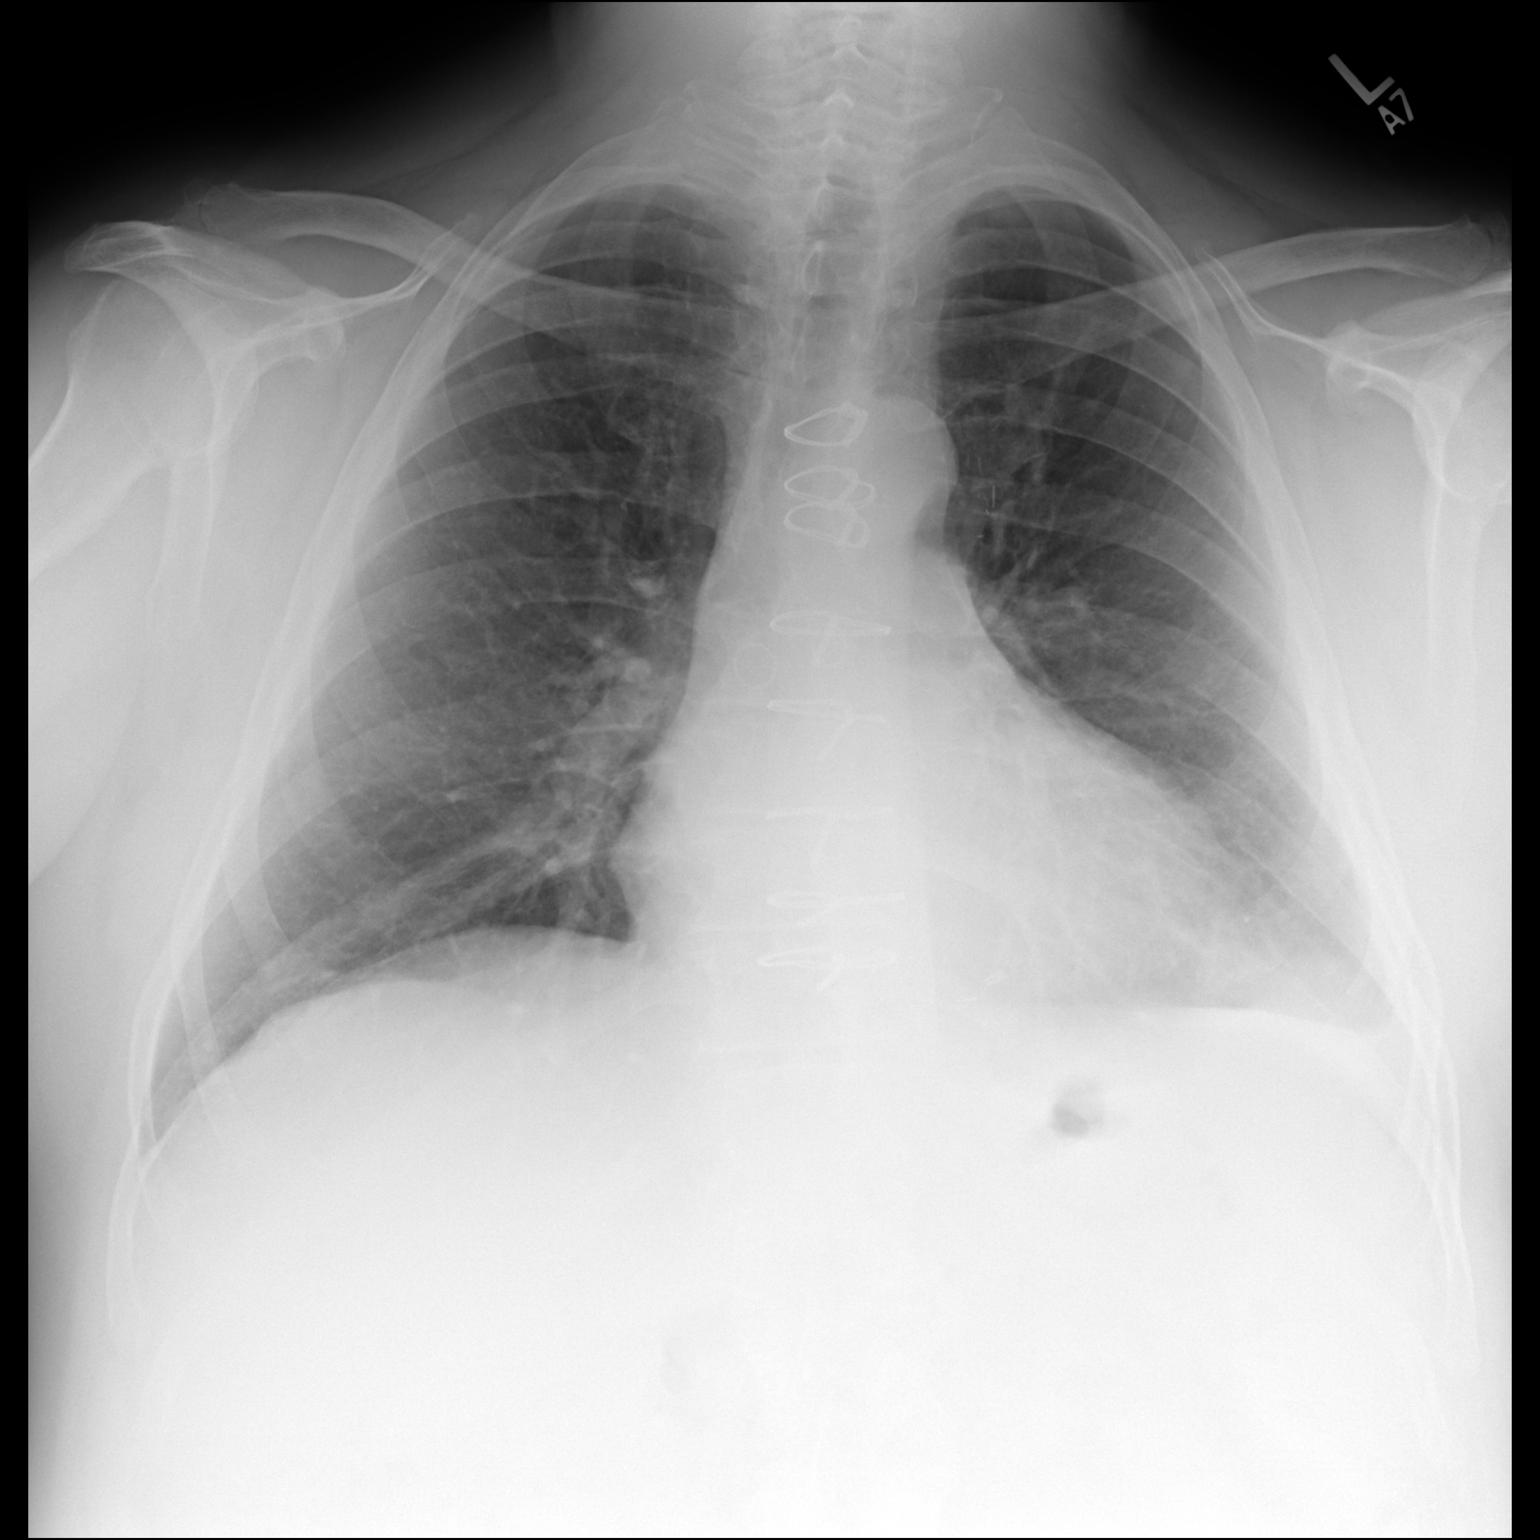

[dg chest 2 view (2 of 2)]
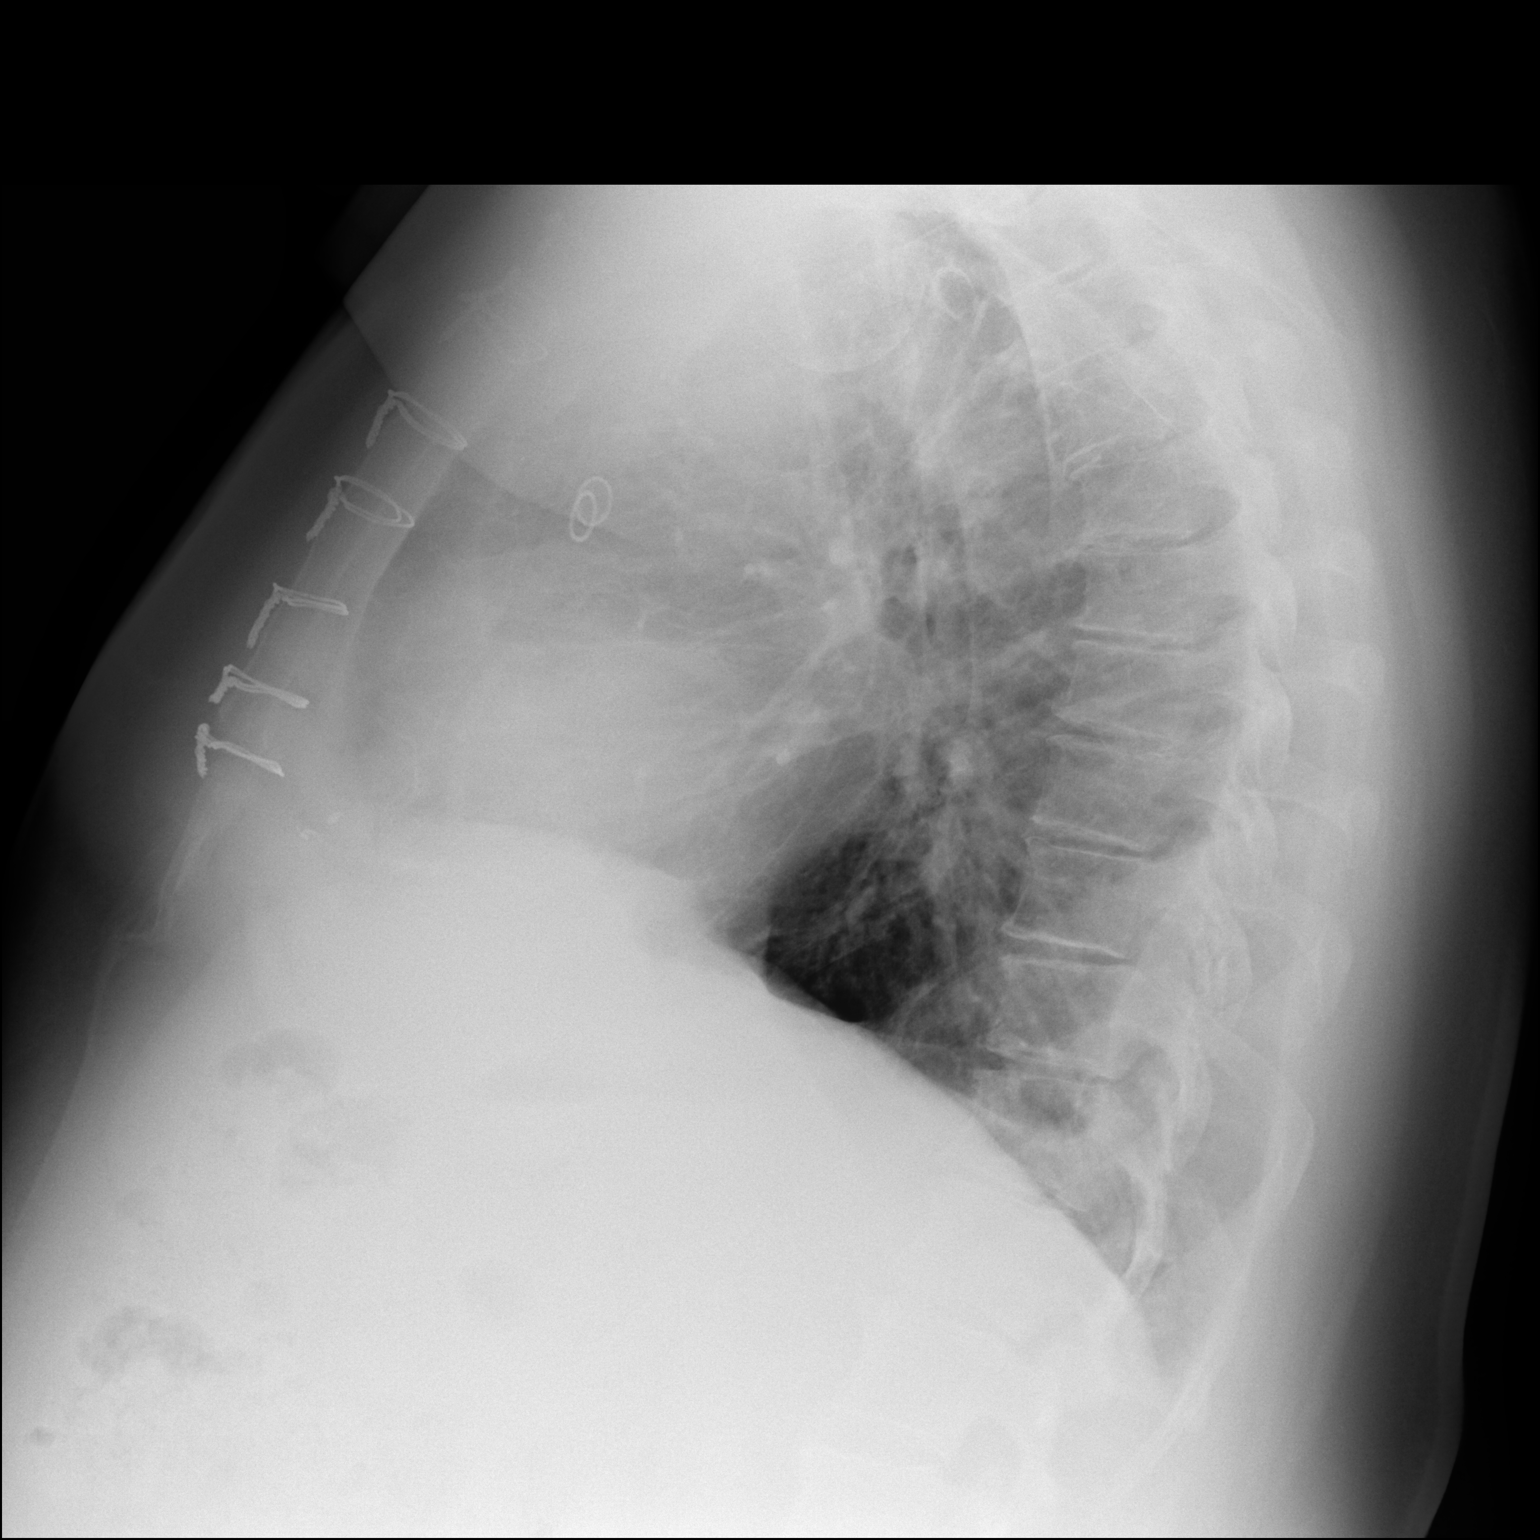

[2 of 2 positions shown; findings below may reference images not displayed]

FINDINGS: CABG changes. Heart size upper normal. Mild pleural scarring left
lung base unchanged. Negative for heart failure. Negative for
infiltrate or effusion or adenopathy.
IMPRESSION: Pleural scarring left lung base.  No acute abnormality.

## 2018-09-07 ENCOUNTER — Ambulatory Visit: Payer: Self-pay | Admitting: Nurse Practitioner

## 2018-09-18 ENCOUNTER — Other Ambulatory Visit: Payer: Self-pay

## 2018-09-18 ENCOUNTER — Other Ambulatory Visit: Payer: Medicaid Other

## 2018-09-18 DIAGNOSIS — R7309 Other abnormal glucose: Secondary | ICD-10-CM

## 2018-09-19 LAB — HEMOGLOBIN A1C
Hgb A1c MFr Bld: 9.2 % of total Hgb — ABNORMAL HIGH (ref <5.7)
Mean Plasma Glucose: 217 (calc)
eAG (mmol/L): 12 (calc)

## 2018-09-20 ENCOUNTER — Other Ambulatory Visit: Payer: Self-pay | Admitting: Family

## 2018-09-22 ENCOUNTER — Ambulatory Visit (INDEPENDENT_AMBULATORY_CARE_PROVIDER_SITE_OTHER): Payer: Self-pay | Admitting: Nurse Practitioner

## 2018-09-22 ENCOUNTER — Encounter: Payer: Self-pay | Admitting: Nurse Practitioner

## 2018-09-22 ENCOUNTER — Other Ambulatory Visit: Payer: Self-pay

## 2018-09-22 VITALS — BP 128/76 | HR 70 | Temp 98.4°F | Ht 66.0 in | Wt 271.6 lb

## 2018-09-22 DIAGNOSIS — Z794 Long term (current) use of insulin: Secondary | ICD-10-CM

## 2018-09-22 DIAGNOSIS — G4733 Obstructive sleep apnea (adult) (pediatric): Secondary | ICD-10-CM

## 2018-09-22 DIAGNOSIS — M2550 Pain in unspecified joint: Secondary | ICD-10-CM

## 2018-09-22 DIAGNOSIS — E1140 Type 2 diabetes mellitus with diabetic neuropathy, unspecified: Secondary | ICD-10-CM

## 2018-09-22 DIAGNOSIS — F339 Major depressive disorder, recurrent, unspecified: Secondary | ICD-10-CM

## 2018-09-22 DIAGNOSIS — G894 Chronic pain syndrome: Secondary | ICD-10-CM

## 2018-09-22 MED ORDER — TOUJEO SOLOSTAR 300 UNIT/ML ~~LOC~~ SOPN: 56.0000 [IU] | PEN_INJECTOR | Freq: Every day | SUBCUTANEOUS | 5 refills | Status: DC

## 2018-09-22 MED ORDER — MELOXICAM 15 MG PO TABS: 15.0000 mg | ORAL_TABLET | Freq: Every day | ORAL | 5 refills | Status: DC

## 2018-09-22 MED ORDER — DULOXETINE HCL 60 MG PO CPEP: 60.0000 mg | ORAL_CAPSULE | Freq: Every day | ORAL | 1 refills | Status: DC

## 2018-09-22 NOTE — Patient Instructions (Addendum)
Increase mobic to 15 mg by mouth daily -- STOP all other NSAIDS (Aleve, Advil, Motrin, Ibuprofen) this would be duplicate therapy  Can use tylenol 1000 mg by mouth every 8 hours- MAX of 3000 mg of tylenol.    STOP zoloft/setraline  START CYMBALTA 60 mg by mouth daily for anxiety/depression/pain   Change diet! This is so important.  Increase physical activity  Increase toujeo to 52 units into skin daily if after 1 week blood sugars FASTING are over 150 increase toujeo to 56 units  Take the blood sugar every morning before you eat.   Follow up in 4 weeks on blood sugar    DASH Eating Plan DASH stands for "Dietary Approaches to Stop Hypertension." The DASH eating plan is a healthy eating plan that has been shown to reduce high blood pressure (hypertension). It may also reduce your risk for type 2 diabetes, heart disease, and stroke. The DASH eating plan may also help with weight loss. What are tips for following this plan?  General guidelines  Avoid eating more than 2,300 mg (milligrams) of salt (sodium) a day. If you have hypertension, you may need to reduce your sodium intake to 1,500 mg a day.  Limit alcohol intake to no more than 1 drink a day for nonpregnant women and 2 drinks a day for men. One drink equals 12 oz of beer, 5 oz of wine, or 1 oz of hard liquor.  Work with your health care provider to maintain a healthy body weight or to lose weight. Ask what an ideal weight is for you.  Get at least 30 minutes of exercise that causes your heart to beat faster (aerobic exercise) most days of the week. Activities may include walking, swimming, or biking.  Work with your health care provider or diet and nutrition specialist (dietitian) to adjust your eating plan to your individual calorie needs. Reading food labels   Check food labels for the amount of sodium per serving. Choose foods with less than 5 percent of the Daily Value of sodium. Generally, foods with less than 300 mg of  sodium per serving fit into this eating plan.  To find whole grains, look for the word "whole" as the first word in the ingredient list. Shopping  Buy products labeled as "low-sodium" or "no salt added."  Buy fresh foods. Avoid canned foods and premade or frozen meals. Cooking  Avoid adding salt when cooking. Use salt-free seasonings or herbs instead of table salt or sea salt. Check with your health care provider or pharmacist before using salt substitutes.  Do not fry foods. Cook foods using healthy methods such as baking, boiling, grilling, and broiling instead.  Cook with heart-healthy oils, such as olive, canola, soybean, or sunflower oil. Meal planning  Eat a balanced diet that includes: ? 5 or more servings of fruits and vegetables each day. At each meal, try to fill half of your plate with fruits and vegetables. ? Up to 6-8 servings of whole grains each day. ? Less than 6 oz of lean meat, poultry, or fish each day. A 3-oz serving of meat is about the same size as a deck of cards. One egg equals 1 oz. ? 2 servings of low-fat dairy each day. ? A serving of nuts, seeds, or beans 5 times each week. ? Heart-healthy fats. Healthy fats called Omega-3 fatty acids are found in foods such as flaxseeds and coldwater fish, like sardines, salmon, and mackerel.  Limit how much you eat of the  following: ? Canned or prepackaged foods. ? Food that is high in trans fat, such as fried foods. ? Food that is high in saturated fat, such as fatty meat. ? Sweets, desserts, sugary drinks, and other foods with added sugar. ? Full-fat dairy products.  Do not salt foods before eating.  Try to eat at least 2 vegetarian meals each week.  Eat more home-cooked food and less restaurant, buffet, and fast food.  When eating at a restaurant, ask that your food be prepared with less salt or no salt, if possible. What foods are recommended? The items listed may not be a complete list. Talk with your  dietitian about what dietary choices are best for you. Grains Whole-grain or whole-wheat bread. Whole-grain or whole-wheat pasta. Brown rice. [place] Morrow. Bulgur. Whole-grain and low-sodium cereals. Pita bread. Low-fat, low-sodium crackers. Whole-wheat flour tortillas. Vegetables Fresh or frozen vegetables (raw, steamed, roasted, or grilled). Low-sodium or reduced-sodium tomato and vegetable juice. Low-sodium or reduced-sodium tomato sauce and tomato paste. Low-sodium or reduced-sodium canned vegetables. Fruits All fresh, dried, or frozen fruit. Canned fruit in natural juice (without added sugar). Meat and other protein foods Skinless chicken or [place]. Ground chicken or [place]. Pork with fat trimmed off. Fish and seafood. Egg whites. Dried beans, peas, or lentils. Unsalted nuts, nut butters, and seeds. Unsalted canned beans. Lean cuts of beef with fat trimmed off. Low-sodium, lean deli meat. Dairy Low-fat (1%) or fat-free (skim) milk. Fat-free, low-fat, or reduced-fat cheeses. Nonfat, low-sodium ricotta or cottage cheese. Low-fat or nonfat yogurt. Low-fat, low-sodium cheese. Fats and oils Soft margarine without trans fats. Vegetable oil. Low-fat, reduced-fat, or light mayonnaise and salad dressings (reduced-sodium). Canola, safflower, olive, soybean, and sunflower oils. Avocado. Seasoning and other foods Herbs. Spices. Seasoning mixes without salt. Unsalted popcorn and pretzels. Fat-free sweets. What foods are not recommended? The items listed may not be a complete list. Talk with your dietitian about what dietary choices are best for you. Grains Baked goods made with fat, such as croissants, muffins, or some breads. Dry pasta or rice meal packs. Vegetables Creamed or fried vegetables. Vegetables in a cheese sauce. Regular canned vegetables (not low-sodium or reduced-sodium). Regular canned tomato sauce and paste (not low-sodium or reduced-sodium). Regular tomato and vegetable juice (not  low-sodium or reduced-sodium). Angie Fava. Olives. Fruits Canned fruit in a light or heavy syrup. Fried fruit. Fruit in cream or butter sauce. Meat and other protein foods Fatty cuts of meat. Ribs. Fried meat. Berniece Salines. Sausage. Bologna and other processed lunch meats. Salami. Fatback. Hotdogs. Bratwurst. Salted nuts and seeds. Canned beans with added salt. Canned or smoked fish. Whole eggs or egg yolks. Chicken or [place] with skin. Dairy Whole or 2% milk, cream, and half-and-half. Whole or full-fat cream cheese. Whole-fat or sweetened yogurt. Full-fat cheese. Nondairy creamers. Whipped toppings. Processed cheese and cheese spreads. Fats and oils Butter. Stick margarine. Lard. Shortening. Ghee. Bacon fat. Tropical oils, such as coconut, palm kernel, or palm oil. Seasoning and other foods Salted popcorn and pretzels. Onion salt, garlic salt, seasoned salt, table salt, and sea salt. Worcestershire sauce. Tartar sauce. Barbecue sauce. Teriyaki sauce. Soy sauce, including reduced-sodium. Steak sauce. Canned and packaged gravies. Fish sauce. Oyster sauce. Cocktail sauce. Horseradish that you find on the shelf. Ketchup. Mustard. Meat flavorings and tenderizers. Bouillon cubes. Hot sauce and Tabasco sauce. Premade or packaged marinades. Premade or packaged taco seasonings. Relishes. Regular salad dressings. Where to find more information:  National Heart, Lung, and [place]: https://wilson-eaton.com/  American Heart Association: www.heart.org  Summary  The DASH eating plan is a healthy eating plan that has been shown to reduce high blood pressure (hypertension). It may also reduce your risk for type 2 diabetes, heart disease, and stroke.  With the DASH eating plan, you should limit salt (sodium) intake to 2,300 mg a day. If you have hypertension, you may need to reduce your sodium intake to 1,500 mg a day.  When on the DASH eating plan, aim to eat more fresh fruits and vegetables, whole grains, lean proteins,  low-fat dairy, and heart-healthy fats.  Work with your health care provider or diet and nutrition specialist (dietitian) to adjust your eating plan to your individual calorie needs. This information is not intended to replace advice given to you by your health care provider. Make sure you discuss any questions you have with your health care provider. Document Released: 12/31/2010 Document Revised: 12/24/2016 Document Reviewed: 01/05/2016 Elsevier Patient Education  2020 [employer].

## 2018-09-22 NOTE — Progress Notes (Signed)
Careteam: Patient Care Team: [name], NP as PCP - General (Geriatric Medicine) Lorretta Harp, MD as Consulting Physician (Cardiology)  Advanced Directive information    No Known Allergies  Chief Complaint  Patient presents with  . Medical Management of Chronic Issues    82-monthfollowup. complains of right hip pain, unable to sleep, restless  . Best Practice Recommendations    Diabetic foot and eye exams, HIV screening     HPI: Patient is a 63y.o. male seen in the office today to follow up diabetes Reports he has so many problems, taking care of himself if the last priority. Can not sleep; having a lot of pain to right hip. Don't get to sit down much Staying with his ex-wife due to losing his job.  She had a significant stroke in January and helping care for her.  Has been trying to get disability, counseling, xray and physical exam Had cataract surgery (only has one eye) No outlet- unable to do therapy because of lack of insurance Had to have 4 teeth removed- unemployement help with that A lot of chronic pain with arthritis.  Used tylenol and advil which has been good  Thrombocytopenia- evaluated by hematology and they continue to monitor.   DM- a1c worse at 9.2; needs to stop the munchies in the afternoon. Eating late. He does reports he cooks his own food. Ate yogurt and fruit last night for dinner. Occasional forgets to take metformin at night.    OSA- does not use his machine, plans to look into this.   Anxiety/depression- continues on zoloft, more depressed. Severe depression. No thoughts of SI or HI.  Out of his blood pressure medication- waiting on this from medication assistant   Review of Systems:  Review of Systems  Constitutional: Positive for malaise/fatigue. Negative for chills, fever and weight loss.  HENT: Negative for tinnitus.   Respiratory: Negative for cough, sputum production and shortness of breath.   Cardiovascular: Negative  for chest pain, palpitations and leg swelling.  Gastrointestinal: Negative for abdominal pain, constipation, diarrhea and heartburn.  Genitourinary: Negative for dysuria, frequency and urgency.  Musculoskeletal: Positive for back pain, joint pain and myalgias. Negative for falls.  Skin: Negative.   Neurological: Positive for weakness. Negative for dizziness and headaches.  Psychiatric/Behavioral: Positive for depression. Negative for memory loss. The patient is nervous/anxious. The patient does not have insomnia.     Past Medical History:  Diagnosis Date  . Coronary artery disease   . MI (myocardial infarction) ([facility] 04/23/2007   inferior wall  . Morbid obesity ([facility] 06/26/2012  . S/P CABG x 3 07/04/2012   LIMA to LAD, SVG to D1, SVG to PDA, EVH via right thigh  . Sleep apnea   . Type II or unspecified type diabetes mellitus without mention of complication, not stated as uncontrolled   . Unspecified essential hypertension    Past Surgical History:  Procedure Laterality Date  . CORONARY ANGIOPLASTY WITH STENT PLACEMENT  04/23/2007   PCI and stenting of mid RCA [facility] . CORONARY ARTERY BYPASS GRAFT N/A 07/04/2012   Procedure: CORONARY ARTERY BYPASS GRAFTING (CABG);  Surgeon: CRexene Alberts MD;  [place] [facility]  Service: Open Heart Surgery;  Laterality: N/A;  x3 using right greater saphenous vein and left internal mammary.   . DENTAL SURGERY  04/2018   4 teeth removed  . INTRAOPERATIVE TRANSESOPHAGEAL ECHOCARDIOGRAM N/A 07/04/2012   Procedure: INTRAOPERATIVE TRANSESOPHAGEAL ECHOCARDIOGRAM;  Surgeon:  Rexene Alberts, MD;  Location: [place];  Service: Open Heart Surgery;  Laterality: N/A;  . LEFT HEART CATH AND CORS/GRAFTS ANGIOGRAPHY N/A 04/18/2017   Procedure: LEFT HEART CATH AND CORS/GRAFTS ANGIOGRAPHY;  Surgeon: Lorretta Harp, MD;  Location: [facility];  Service: Cardiovascular;  Laterality: N/A;  . LEFT HEART CATHETERIZATION WITH CORONARY ANGIOGRAM N/A 06/25/2012    Procedure: LEFT HEART CATHETERIZATION WITH CORONARY ANGIOGRAM;  Surgeon: Lorretta Harp, MD;  Location: [facility] CATH LAB;  Service: Cardiovascular;  Laterality: N/A;  . TOOTH EXTRACTION  04/2018   4 teeth pulled    Social History:   reports that he has never smoked. He has never used smokeless tobacco. He reports that he does not drink alcohol or use drugs.  Family History  Problem Relation Age of Onset  . Cancer Mother   . Diabetes Sister   . Diabetes Brother     Medications: Patient's Medications  New Prescriptions   No medications on file  Previous Medications   ALBUTEROL (PROVENTIL HFA;VENTOLIN HFA) 108 (90 BASE) MCG/ACT INHALER    Inhale 2 puffs into the lungs every 6 (six) hours as needed for wheezing or shortness of breath.   ASPIRIN 81 MG CHEWABLE TABLET    Chew 81 mg by mouth daily.    ATORVASTATIN (LIPITOR) 80 MG TABLET    Take 1 tablet (80 mg total) by mouth daily.   BLOOD GLUCOSE MONITORING SUPPL (CONTOUR NEXT EZ MONITOR) W/DEVICE KIT    Test blood sugar three times daily E11.22   BUSPIRONE (BUSPAR) 15 MG TABLET    TAKE 1 TABLET BY MOUTH THREE TIMES DAILY FOR ANXIETY   CLOPIDOGREL (PLAVIX) 75 MG TABLET    Take one tablet by mouth once daily   GLUCOSE BLOOD (BAYER CONTOUR NEXT TEST) TEST STRIP    Use as instructed   IBUPROFEN (ADVIL) 200 MG TABLET    Take 1 tablet (200 mg total) by mouth every 8 (eight) hours as needed for mild pain or moderate pain.   INSULIN GLARGINE, 1 UNIT DIAL, (TOUJEO SOLOSTAR) 300 UNIT/ML SOPN    Inject 48 Units into the skin daily.    INSULIN PEN NEEDLE 32G X 4 MM MISC    Use as Directed. Dx: E11.40   LISINOPRIL (PRINIVIL,ZESTRIL) 5 MG TABLET    TAKE 1 TABLET(5 MG) BY MOUTH DAILY   MELOXICAM (MOBIC) 7.5 MG TABLET    TAKE 1 TABLET(7.5 MG) BY MOUTH DAILY   METFORMIN (GLUCOPHAGE) 1000 MG TABLET    TAKE 1 TABLET(1000 MG) BY MOUTH TWICE DAILY   METOPROLOL TARTRATE (LOPRESSOR) 25 MG TABLET    Take 0.5 tablets (12.5 mg total) by mouth 2 (two) times daily.    OMEPRAZOLE (PRILOSEC) 40 MG CAPSULE    Take 1 capsule (40 mg total) by mouth daily.   SERTRALINE (ZOLOFT) 100 MG TABLET    TAKE 1 AND 1/2 TABLETS BY MOUTH AT BEDTIME   THIAMINE (VITAMIN B-1) 100 MG TABLET    Take 100 mg by mouth daily.  Modified Medications   No medications on file  Discontinued Medications   No medications on file    Physical Exam:  Vitals:   09/22/18 1002  BP: 128/76  Pulse: 70  Temp: 98.4 F (36.9 C)  TempSrc: Oral  SpO2: 93%  Weight: 271 lb 9.6 oz (123.2 kg)  Height: '5\' 6"'  (1.676 m)   Body mass index is 43.84 kg/m. Wt Readings from Last 3 Encounters:  09/22/18 271 lb 9.6 oz (123.2  kg)  07/14/18 267 lb (121.1 kg)  06/20/18 263 lb (119.3 kg)    Physical Exam Constitutional:      Appearance: He is well-developed.  HENT:     Head: Normocephalic and atraumatic.  Eyes:     General: No scleral icterus.    Pupils: Pupils are equal, round, and reactive to light.  Neck:     Musculoskeletal: Neck supple.     Thyroid: No thyromegaly.     Vascular: No carotid bruit.  Cardiovascular:     Rate and Rhythm: Normal rate and regular rhythm.     Heart sounds: Murmur present. Systolic murmur present with a grade of 1/6. No friction rub. No gallop.      Comments: no distal LE swelling. No calf TTP Pulmonary:     Effort: Pulmonary effort is normal.     Breath sounds: Normal breath sounds. No wheezing or rales.  Chest:     Chest wall: No tenderness.  Abdominal:     General: Bowel sounds are normal. There is no distension or abdominal bruit.     Palpations: Abdomen is soft. There is no hepatomegaly, mass or pulsatile mass.     Tenderness: There is no abdominal tenderness. There is no guarding or rebound.     Comments: obese  Lymphadenopathy:     Cervical: No cervical adenopathy.  Skin:    General: Skin is warm and dry.     Findings: No rash.  Neurological:     Mental Status: He is alert and oriented to person, place, and time.     Deep Tendon Reflexes:  Reflexes are normal and symmetric.  Psychiatric:        Behavior: Behavior normal.        Thought Content: Thought content normal.        Judgment: Judgment normal.     Labs reviewed: Basic Metabolic Panel: Recent Labs    06/15/18 0822 07/14/18 0812  NA 138 137  K 4.4 4.5  CL 103 102  CO2 25 29  GLUCOSE 117* 196*  BUN 16 14  CREATININE 0.99 0.85  CALCIUM 8.9 9.6   Liver Function Tests: Recent Labs    06/15/18 0822 07/14/18 0812  AST 46* 44*  ALT 39 48*  ALKPHOS  --  95  BILITOT 0.7 0.6  PROT 6.9 7.2  ALBUMIN  --  3.9   No results for input(s): LIPASE, AMYLASE in the last 8760 hours. No results for input(s): AMMONIA in the last 8760 hours. CBC: Recent Labs    06/15/18 0822 06/20/18 0908 07/14/18 0812  WBC 5.2 6.0 5.0  NEUTROABS 3,359  --  3.2  HGB 12.2* 12.8* 12.2*  HCT 37.5* 39.4 37.5*  MCV 89.7 91.4 91.2  PLT 78* 90* 81*   Lipid Panel: Recent Labs    06/15/18 0822  CHOL 116  HDL 35*  LDLCALC 63  TRIG 92  CHOLHDL 3.3   TSH: No results for input(s): TSH in the last 8760 hours. A1C: Lab Results  Component Value Date   HGBA1C 9.2 (H) 09/18/2018     Assessment/Plan 1. Depression, recurrent ([place]) Not controlled. Due to pain will stop zoloft and start cymbalta.  Also discussed importance of proper diet and increase in physical activity, trying to get outside daily to help with mood - DULoxetine (CYMBALTA) 60 MG capsule; Take 1 capsule (60 mg total) by mouth daily.  Dispense: 30 capsule; Refill: 1  2. Type 2 diabetes mellitus with diabetic neuropathy, with long-term current use  of insulin ([place]) -NOT controlled. Pt admits to poor diet -stress importance of proper eating habits and increase in physical activity.  -encouraged walking every morning as he can tolerate (limited due to hit) has an exercise machine he is also planning on using.  -Encouraged dietary compliance, routine foot care/monitoring and to keep up with diabetic eye exams through  ophthalmology  -stress long term adverse effective of elevated blood sugars on the body and end organ damage, he is aware. -will increase toujeo to 52 units daily. To continue to monitor blood sugars and if after 1 week fasting cbg still over 150 to increase to 56 units.  - Insulin Glargine, 1 Unit Dial, (TOUJEO SOLOSTAR) 300 UNIT/ML SOPN; Inject 56 Units into the skin daily.  Dispense: 20 pen; Refill: 5  3. Morbid obesity due to excess calories ([place]) -discussed importance of weight loss with healthy diet and activity. Strongly encouraged lifestyle modifications.   4. Chronic pain syndrome -ongoing pain with anxiety/depression. Hopefully addition of cymbalta will help with this. - meloxicam (MOBIC) 15 MG tablet; Take 1 tablet (15 mg total) by mouth daily.  Dispense: 30 tablet; Refill: 5 - DULoxetine (CYMBALTA) 60 MG capsule; Take 1 capsule (60 mg total) by mouth daily.  Dispense: 30 capsule; Refill: 1  5. Pain in joint involving multiple sites -not controlled discussed importance of weight loss and increasing physical activity to gain muscle and strength. To continue tylenol 1000 mg by mouth every 8 hours as needed for pain. MAX 3000 mg in 24 hours.  -will increase mobic to 15 mg for now but will reassess and decrease back to 7.5 as soon as able.  -to not use any other NSAIDs with mobic.  - meloxicam (MOBIC) 15 MG tablet; Take 1 tablet (15 mg total) by mouth daily.  Dispense: 30 tablet; Refill: 5  6. OSA (obstructive sleep apnea) Strongly encouraged compliance with CPAP  Next appt: 4 weeks for follow up on mood and diabetes.  Carlos American. [place], [facility] [phone]

## 2018-10-09 ENCOUNTER — Telehealth: Payer: Self-pay

## 2018-10-09 ENCOUNTER — Telehealth: Payer: Self-pay | Admitting: *Deleted

## 2018-10-09 NOTE — Telephone Encounter (Signed)
Call received that pt.'s free dose of feraheme will arrive to [facility] tomorrow.

## 2018-10-09 NOTE — Telephone Encounter (Signed)
Called and spoke with patient regarding appointment added to current schedule per 9/14 msg

## 2018-10-12 ENCOUNTER — Telehealth: Payer: Self-pay | Admitting: Hematology & Oncology

## 2018-10-12 ENCOUNTER — Encounter: Payer: Self-pay | Admitting: Family

## 2018-10-12 ENCOUNTER — Other Ambulatory Visit: Payer: Self-pay | Admitting: Family

## 2018-10-12 ENCOUNTER — Inpatient Hospital Stay: Payer: Medicaid Other | Attending: Family | Admitting: Family

## 2018-10-12 ENCOUNTER — Inpatient Hospital Stay: Payer: Medicaid Other

## 2018-10-12 ENCOUNTER — Other Ambulatory Visit: Payer: Self-pay

## 2018-10-12 VITALS — BP 145/66 | HR 52 | Resp 17

## 2018-10-12 VITALS — BP 129/82 | HR 61 | Resp 18 | Ht 66.0 in | Wt 271.0 lb

## 2018-10-12 DIAGNOSIS — Z794 Long term (current) use of insulin: Secondary | ICD-10-CM | POA: Insufficient documentation

## 2018-10-12 DIAGNOSIS — R161 Splenomegaly, not elsewhere classified: Secondary | ICD-10-CM | POA: Insufficient documentation

## 2018-10-12 DIAGNOSIS — Z7984 Long term (current) use of oral hypoglycemic drugs: Secondary | ICD-10-CM | POA: Insufficient documentation

## 2018-10-12 DIAGNOSIS — Z79899 Other long term (current) drug therapy: Secondary | ICD-10-CM | POA: Insufficient documentation

## 2018-10-12 DIAGNOSIS — D508 Other iron deficiency anemias: Secondary | ICD-10-CM

## 2018-10-12 DIAGNOSIS — D509 Iron deficiency anemia, unspecified: Secondary | ICD-10-CM | POA: Insufficient documentation

## 2018-10-12 DIAGNOSIS — D696 Thrombocytopenia, unspecified: Secondary | ICD-10-CM

## 2018-10-12 LAB — CMP (CANCER CENTER ONLY)
ALT: 43 U/L (ref 0–44)
AST: 42 U/L — ABNORMAL HIGH (ref 15–41)
Albumin: 4 g/dL (ref 3.5–5.0)
Alkaline Phosphatase: 87 U/L (ref 38–126)
Anion gap: 7 (ref 5–15)
BUN: 15 mg/dL (ref 8–23)
CO2: 26 mmol/L (ref 22–32)
Calcium: 9.4 mg/dL (ref 8.9–10.3)
Chloride: 104 mmol/L (ref 98–111)
Creatinine: 0.97 mg/dL (ref 0.61–1.24)
GFR, Est AFR Am: >60 mL/min (ref >60)
GFR, Estimated: >60 mL/min (ref >60)
Glucose, Bld: 155 mg/dL — ABNORMAL HIGH (ref 70–99)
Potassium: 4.2 mmol/L (ref 3.5–5.1)
Sodium: 137 mmol/L (ref 135–145)
Total Bilirubin: 0.6 mg/dL (ref 0.3–1.2)
Total Protein: 7.3 g/dL (ref 6.5–8.1)

## 2018-10-12 LAB — PLATELET BY CITRATE

## 2018-10-12 LAB — CBC WITH DIFFERENTIAL (CANCER CENTER ONLY)
Abs Immature Granulocytes: 0 10*3/uL (ref 0.00–0.07)
Basophils Absolute: 0.1 10*3/uL (ref 0.0–0.1)
Basophils Relative: 1 %
Eosinophils Absolute: 0.2 10*3/uL (ref 0.0–0.5)
Eosinophils Relative: 3 %
HCT: 39 % (ref 39.0–52.0)
Hemoglobin: 12.6 g/dL — ABNORMAL LOW (ref 13.0–17.0)
Immature Granulocytes: 0 %
Lymphocytes Relative: 25 %
Lymphs Abs: 1.3 10*3/uL (ref 0.7–4.0)
MCH: 29.3 pg (ref 26.0–34.0)
MCHC: 32.3 g/dL (ref 30.0–36.0)
MCV: 90.7 fL (ref 80.0–100.0)
Monocytes Absolute: 0.6 10*3/uL (ref 0.1–1.0)
Monocytes Relative: 10 %
Neutro Abs: 3.3 10*3/uL (ref 1.7–7.7)
Neutrophils Relative %: 61 %
Platelet Count: 85 10*3/uL — ABNORMAL LOW (ref 150–400)
RBC: 4.3 MIL/uL (ref 4.22–5.81)
RDW: 14.6 % (ref 11.5–15.5)
WBC Count: 5.4 10*3/uL (ref 4.0–10.5)
nRBC: 0 % (ref 0.0–0.2)

## 2018-10-12 LAB — SAVE SMEAR(SSMR), FOR PROVIDER SLIDE REVIEW

## 2018-10-12 MED ORDER — SODIUM CHLORIDE 0.9 % IV SOLN
Freq: Once | INTRAVENOUS | Status: AC
  Administered 2018-10-12: 11:00:00 via INTRAVENOUS
  Filled 2018-10-12: qty 250

## 2018-10-12 MED ORDER — SODIUM CHLORIDE 0.9 % IV SOLN
510.0000 mg | Freq: Once | INTRAVENOUS | Status: AC
  Administered 2018-10-12: 510 mg via INTRAVENOUS
  Filled 2018-10-12: qty 510

## 2018-10-12 MED ORDER — SODIUM CHLORIDE 0.9% FLUSH
10.0000 mL | Freq: Once | INTRAVENOUS | Status: DC | PRN
  Filled 2018-10-12: qty 10

## 2018-10-12 MED ORDER — SODIUM CHLORIDE 0.9% FLUSH
3.0000 mL | Freq: Once | INTRAVENOUS | Status: DC | PRN
  Filled 2018-10-12: qty 10

## 2018-10-12 NOTE — Progress Notes (Signed)
Hematology and Oncology Follow Up Visit  Barry [name] [id number] 04-Oct-1955 63 y.o. 10/12/2018   Principle Diagnosis:  Thrombocytopenia with mild splenomegaly  Iron deficiency anemia   Current Therapy:   Observations  IV iron as indicated   Interim History:  Barry [name] is here today for follow-up and IV iron. He states that he has been feeling fatigued.  He does not sleep well due to nerve pain in the right hip. This waxes and wanes.  [place] in June showed mild splenomegaly and fatty liver.  Hgb is stable at 12.6, platelets 85.  No episodes of bleeding. No bruising or petechiae.  No fever, chills, n/v, cough, rash, dizziness, SOB, chest pain, palpitations, abdominal pain or changes in bowel or bladder habits.  No swelling, numbness or tingling in his extremities at this time.  He is eating healthier and has recently had his insulin adjusted. He states that his blood sugars are improving. He is staying well hydrated. His weight is stable.   ECOG Performance Status: 1 - Symptomatic but completely ambulatory  Medications:  Allergies as of 10/12/2018   No Known Allergies     Medication List       Accurate as of October 12, 2018  9:48 AM. If you have any questions, ask your nurse or doctor.        albuterol 108 (90 Base) MCG/ACT inhaler Commonly known as: VENTOLIN HFA Inhale 2 puffs into the lungs every 6 (six) hours as needed for wheezing or shortness of breath.   aspirin 81 MG chewable tablet Chew 81 mg by mouth daily.   atorvastatin 80 MG tablet Commonly known as: LIPITOR Take 1 tablet (80 mg total) by mouth daily.   busPIRone 15 MG tablet Commonly known as: BUSPAR TAKE 1 TABLET BY MOUTH THREE TIMES DAILY FOR ANXIETY   clopidogrel 75 MG tablet Commonly known as: PLAVIX Take one tablet by mouth once daily   CONTOUR NEXT EZ MONITOR w/Device Kit Test blood sugar three times daily E11.22   DULoxetine 60 MG capsule Commonly known as: CYMBALTA Take 1 capsule (60 mg total)  by mouth daily.   glucose blood test strip Commonly known as: Visual merchandiser Next Test Use as instructed   ibuprofen 200 MG tablet Commonly known as: Advil Take 1 tablet (200 mg total) by mouth every 8 (eight) hours as needed for mild pain or moderate pain.   Insulin Pen Needle 32G X 4 MM Misc Use as Directed. Dx: E11.40   lisinopril 5 MG tablet Commonly known as: ZESTRIL TAKE 1 TABLET(5 MG) BY MOUTH DAILY   meloxicam 15 MG tablet Commonly known as: MOBIC Take 1 tablet (15 mg total) by mouth daily.   metFORMIN 1000 MG tablet Commonly known as: GLUCOPHAGE TAKE 1 TABLET(1000 MG) BY MOUTH TWICE DAILY   metoprolol tartrate 25 MG tablet Commonly known as: LOPRESSOR Take 0.5 tablets (12.5 mg total) by mouth 2 (two) times daily.   omeprazole 40 MG capsule Commonly known as: PRILOSEC Take 1 capsule (40 mg total) by mouth daily.   thiamine 100 MG tablet Commonly known as: VITAMIN B-1 Take 100 mg by mouth daily.   Toujeo SoloStar 300 UNIT/ML Sopn Generic drug: Insulin Glargine (1 Unit Dial) Inject 56 Units into the skin daily.       Allergies: No Known Allergies  Past Medical History, Surgical history, Social history, and Family History were reviewed and updated.  Review of Systems: All other 10 point review of systems is negative.   Physical  Exam:  vitals were not taken for this visit.   Wt Readings from Last 3 Encounters:  09/22/18 271 lb 9.6 oz (123.2 kg)  07/14/18 267 lb (121.1 kg)  06/20/18 263 lb (119.3 kg)    Ocular: Sclerae unicteric, pupils equal, round and reactive to light Ear-nose-throat: Oropharynx clear, dentition fair Lymphatic: No cervical or supraclavicular adenopathy Lungs no rales or rhonchi, good excursion bilaterally Heart regular rate and rhythm, no murmur appreciated Abd soft, nontender, positive bowel sounds, no liver or spleen tip palpated on exam, no fluid wave  MSK no focal spinal tenderness, no joint edema Neuro: non-focal,  well-oriented, appropriate affect Breasts: Deferred   Lab Results  Component Value Date   WBC 5.4 10/12/2018   HGB 12.6 (L) 10/12/2018   HCT 39.0 10/12/2018   MCV 90.7 10/12/2018   PLT 85 (L) 10/12/2018   Lab Results  Component Value Date   FERRITIN 11 (L) 07/14/2018   IRON 64 07/14/2018   TIBC 458 (H) 07/14/2018   UIBC 393 (H) 07/14/2018   IRONPCTSAT 14 (L) 07/14/2018   Lab Results  Component Value Date   RETICCTPCT 1.1 07/14/2018   RBC 4.30 10/12/2018   No results found for: [facility] Lab Results  Component Value Date   IGGSERUM 1,430 03/18/2017   IGMSERUM 93 03/18/2017   Lab Results  Component Value Date   ALBUMINELP 4.0 03/18/2017   A1GS 0.3 03/18/2017   A2GS 0.7 03/18/2017   BETS 0.6 03/18/2017   BETA2SER 0.4 03/18/2017   GAMS 1.3 03/18/2017   SPEI  03/18/2017     Comment:     . A poorly-defined band of restricted protein mobility is detected in the gamma globulins. It is unlikely that this may represent a monoclonal protein; however, immunofixation analysis is available if clinically indicated. .      Chemistry      Component Value Date/Time   NA 137 07/14/2018 0812   NA 139 04/14/2017 0824   K 4.5 07/14/2018 0812   CL 102 07/14/2018 0812   CO2 29 07/14/2018 0812   BUN 14 07/14/2018 0812   BUN 19 04/14/2017 0824   CREATININE 0.85 07/14/2018 0812   CREATININE 0.99 06/15/2018 0822      Component Value Date/Time   CALCIUM 9.6 07/14/2018 0812   ALKPHOS 95 07/14/2018 0812   AST 44 (H) 07/14/2018 0812   ALT 48 (H) 07/14/2018 0812   BILITOT 0.6 07/14/2018 0812       Impression and Plan: Barry [name] is a pleasant 63 yo caucasian gentleman with iron deficiency anemia and thrombocytopenia with mild splenomegaly.  He will get IV iron today.  Platelets are stable at 85.  We will continue to follow along with him and see him back in another 3 month.  He will contact our office with any questions or concerns. We can certainly  see him sooner if needed.   Laverna Peace, NP 9/17/20209:48 AM

## 2018-10-12 NOTE — Telephone Encounter (Signed)
Appointments scheduled calendar printed per 9/17 los °

## 2018-10-12 NOTE — Patient Instructions (Signed)

## 2018-10-23 ENCOUNTER — Ambulatory Visit: Payer: Medicaid Other | Admitting: Nurse Practitioner

## 2018-10-23 ENCOUNTER — Encounter: Payer: Self-pay | Admitting: Nurse Practitioner

## 2018-10-23 ENCOUNTER — Other Ambulatory Visit: Payer: Self-pay

## 2018-10-23 VITALS — BP 124/82 | HR 75 | Temp 97.8°F | Ht 66.0 in | Wt 266.0 lb

## 2018-10-23 DIAGNOSIS — Z23 Encounter for immunization: Secondary | ICD-10-CM

## 2018-10-23 DIAGNOSIS — E785 Hyperlipidemia, unspecified: Secondary | ICD-10-CM

## 2018-10-23 DIAGNOSIS — Z794 Long term (current) use of insulin: Secondary | ICD-10-CM

## 2018-10-23 DIAGNOSIS — D696 Thrombocytopenia, unspecified: Secondary | ICD-10-CM

## 2018-10-23 DIAGNOSIS — F339 Major depressive disorder, recurrent, unspecified: Secondary | ICD-10-CM

## 2018-10-23 DIAGNOSIS — E1140 Type 2 diabetes mellitus with diabetic neuropathy, unspecified: Secondary | ICD-10-CM

## 2018-10-23 DIAGNOSIS — G894 Chronic pain syndrome: Secondary | ICD-10-CM

## 2018-10-23 NOTE — Patient Instructions (Addendum)
Continue to work on [employer].  Makes sure to eat consistent meals every day Notify us when you get insurance and we will order nutritionist referral.   DASH Eating Plan DASH stands for "Dietary Approaches to Stop Hypertension." The DASH eating plan is a healthy eating plan that has been shown to reduce high blood pressure (hypertension). It may also reduce your risk for type 2 diabetes, heart disease, and stroke. The DASH eating plan may also help with weight loss. What are tips for following this plan?  General guidelines  Avoid eating more than 2,300 mg (milligrams) of salt (sodium) a day. If you have hypertension, you may need to reduce your sodium intake to 1,500 mg a day.  Limit alcohol intake to no more than 1 drink a day for nonpregnant women and 2 drinks a day for men. One drink equals 12 oz of beer, 5 oz of wine, or 1 oz of hard liquor.  Work with your health care provider to maintain a healthy body weight or to lose weight. Ask what an ideal weight is for you.  Get at least 30 minutes of exercise that causes your heart to beat faster (aerobic exercise) most days of the week. Activities may include walking, swimming, or biking.  Work with your health care provider or diet and nutrition specialist (dietitian) to adjust your eating plan to your individual calorie needs. Reading food labels   Check food labels for the amount of sodium per serving. Choose foods with less than 5 percent of the Daily Value of sodium. Generally, foods with less than 300 mg of sodium per serving fit into this eating plan.  To find whole grains, look for the word "whole" as the first word in the ingredient list. Shopping  Buy products labeled as "low-sodium" or "no salt added."  Buy fresh foods. Avoid canned foods and premade or frozen meals. Cooking  Avoid adding salt when cooking. Use salt-free seasonings or herbs instead of table salt or sea salt. Check with your health care provider or  pharmacist before using salt substitutes.  Do not fry foods. Cook foods using healthy methods such as baking, boiling, grilling, and broiling instead.  Cook with heart-healthy oils, such as olive, canola, soybean, or sunflower oil. Meal planning  Eat a balanced diet that includes: ? 5 or more servings of fruits and vegetables each day. At each meal, try to fill half of your plate with fruits and vegetables. ? Up to 6-8 servings of whole grains each day. ? Less than 6 oz of lean meat, poultry, or fish each day. A 3-oz serving of meat is about the same size as a deck of cards. One egg equals 1 oz. ? 2 servings of low-fat dairy each day. ? A serving of nuts, seeds, or beans 5 times each week. ? Heart-healthy fats. Healthy fats called Omega-3 fatty acids are found in foods such as flaxseeds and coldwater fish, like sardines, salmon, and mackerel.  Limit how much you eat of the following: ? Canned or prepackaged foods. ? Food that is high in trans fat, such as fried foods. ? Food that is high in saturated fat, such as fatty meat. ? Sweets, desserts, sugary drinks, and other foods with added sugar. ? Full-fat dairy products.  Do not salt foods before eating.  Try to eat at least 2 vegetarian meals each week.  Eat more home-cooked food and less restaurant, buffet, and fast food.  When eating at a restaurant, ask that your  food be prepared with less salt or no salt, if possible. What foods are recommended? The items listed may not be a complete list. Talk with your dietitian about what dietary choices are best for you. Grains Whole-grain or whole-wheat bread. Whole-grain or whole-wheat pasta. Brown rice. [place]. Bulgur. Whole-grain and low-sodium cereals. Pita bread. Low-fat, low-sodium crackers. Whole-wheat flour tortillas. Vegetables Fresh or frozen vegetables (raw, steamed, roasted, or grilled). Low-sodium or reduced-sodium tomato and vegetable juice. Low-sodium or  reduced-sodium tomato sauce and tomato paste. Low-sodium or reduced-sodium canned vegetables. Fruits All fresh, dried, or frozen fruit. Canned fruit in natural juice (without added sugar). Meat and other protein foods Skinless chicken or [place]. Ground chicken or [place]. Pork with fat trimmed off. Fish and seafood. Egg whites. Dried beans, peas, or lentils. Unsalted nuts, nut butters, and seeds. Unsalted canned beans. Lean cuts of beef with fat trimmed off. Low-sodium, lean deli meat. Dairy Low-fat (1%) or fat-free (skim) milk. Fat-free, low-fat, or reduced-fat cheeses. Nonfat, low-sodium ricotta or cottage cheese. Low-fat or nonfat yogurt. Low-fat, low-sodium cheese. Fats and oils Soft margarine without trans fats. Vegetable oil. Low-fat, reduced-fat, or light mayonnaise and salad dressings (reduced-sodium). Canola, safflower, olive, soybean, and sunflower oils. Avocado. Seasoning and other foods Herbs. Spices. Seasoning mixes without salt. Unsalted popcorn and pretzels. Fat-free sweets. What foods are not recommended? The items listed may not be a complete list. Talk with your dietitian about what dietary choices are best for you. Grains Baked goods made with fat, such as croissants, muffins, or some breads. Dry pasta or rice meal packs. Vegetables Creamed or fried vegetables. Vegetables in a cheese sauce. Regular canned vegetables (not low-sodium or reduced-sodium). Regular canned tomato sauce and paste (not low-sodium or reduced-sodium). Regular tomato and vegetable juice (not low-sodium or reduced-sodium). Angie Fava. Olives. Fruits Canned fruit in a light or heavy syrup. Fried fruit. Fruit in cream or butter sauce. Meat and other protein foods Fatty cuts of meat. Ribs. Fried meat. Berniece Salines. Sausage. Bologna and other processed lunch meats. Salami. Fatback. Hotdogs. Bratwurst. Salted nuts and seeds. Canned beans with added salt. Canned or smoked fish. Whole eggs or egg yolks. Chicken or [place]  with skin. Dairy Whole or 2% milk, cream, and half-and-half. Whole or full-fat cream cheese. Whole-fat or sweetened yogurt. Full-fat cheese. Nondairy creamers. Whipped toppings. Processed cheese and cheese spreads. Fats and oils Butter. Stick margarine. Lard. Shortening. Ghee. Bacon fat. Tropical oils, such as coconut, palm kernel, or palm oil. Seasoning and other foods Salted popcorn and pretzels. Onion salt, garlic salt, seasoned salt, table salt, and sea salt. Worcestershire sauce. Tartar sauce. Barbecue sauce. Teriyaki sauce. Soy sauce, including reduced-sodium. Steak sauce. Canned and packaged gravies. Fish sauce. Oyster sauce. Cocktail sauce. Horseradish that you find on the shelf. Ketchup. Mustard. Meat flavorings and tenderizers. Bouillon cubes. Hot sauce and Tabasco sauce. Premade or packaged marinades. Premade or packaged taco seasonings. Relishes. Regular salad dressings. Where to find more information:  National Heart, Lung, and [place]: https://wilson-eaton.com/  American Heart Association: www.heart.org Summary  The DASH eating plan is a healthy eating plan that has been shown to reduce high blood pressure (hypertension). It may also reduce your risk for type 2 diabetes, heart disease, and stroke.  With the DASH eating plan, you should limit salt (sodium) intake to 2,300 mg a day. If you have hypertension, you may need to reduce your sodium intake to 1,500 mg a day.  When on the DASH eating plan, aim to eat more fresh fruits and vegetables,  whole grains, lean proteins, low-fat dairy, and heart-healthy fats.  Work with your health care provider or diet and nutrition specialist (dietitian) to adjust your eating plan to your individual calorie needs. This information is not intended to replace advice given to you by your health care provider. Make sure you discuss any questions you have with your health care provider. Document Released: 12/31/2010 Document Revised: 12/24/2016  Document Reviewed: 01/05/2016 Elsevier Patient Education  2020 [employer].

## 2018-10-23 NOTE — Progress Notes (Signed)
Careteam: Patient Care Team: [name], NP as PCP - General (Geriatric Medicine) Lorretta Harp, MD as Consulting Physician (Cardiology)  Advanced Directive information    No Known Allergies  Chief Complaint  Patient presents with  . Follow-up    4 week follow-up on blood sugar   . Immunizations    Need to check the cost of flu vaccine, self-pay  . Sleeping Problem    Patient c/o not sleeping well at night, patient had 2 hours of sleep last night   . Pain    Patient c/o generalized pain      HPI: Patient is a 63 y.o. male seen in the office today for blood sugar.  Quit drinking soda and has been doing things to change his diet.  Has increased activity as he can- limited due to chronic pain.  Has titrated toujeo up to 56 units  Fasting blood sugars are now below 200. Did not bring blood sugar meter or log  174, 154 are 2 he can remember.  He will have low blood sugar. Reports CBG around 120 when he has hypoglycemia.  Doing a lot of fresh foods. Looking at labs at the grocery store.   Chronic pain- mobic was not beneficial. He is taking more than 3000 mg of tylenol.   Anxiety/depression- mood is okay, some help with cymbalta and pain.   Approved for disability- insurance starts next month.   Had iron infused last week.    Review of Systems:  Review of Systems  Constitutional: Negative for chills, fever, malaise/fatigue and weight loss.  HENT: Negative for tinnitus.   Respiratory: Negative for cough, sputum production and shortness of breath.   Cardiovascular: Negative for chest pain, palpitations and leg swelling.  Gastrointestinal: Negative for abdominal pain, constipation, diarrhea and heartburn.  Genitourinary: Negative for dysuria, frequency and urgency.  Musculoskeletal: Positive for back pain, joint pain and myalgias. Negative for falls.  Skin: Negative.   Neurological: Positive for weakness. Negative for dizziness and headaches.   Psychiatric/Behavioral: Positive for depression. Negative for memory loss. The patient is nervous/anxious. The patient does not have insomnia.     Past Medical History:  Diagnosis Date  . Coronary artery disease   . MI (myocardial infarction) ([place]) 04/23/2007   inferior wall  . Morbid obesity ([place]) 06/26/2012  . S/P CABG x 3 07/04/2012   LIMA to LAD, SVG to D1, SVG to PDA, EVH via right thigh  . Sleep apnea   . Type II or unspecified type diabetes mellitus without mention of complication, not stated as uncontrolled   . Unspecified essential hypertension    Past Surgical History:  Procedure Laterality Date  . CORONARY ANGIOPLASTY WITH STENT PLACEMENT  04/23/2007   PCI and stenting of mid RCA - Dr Donnetta Hutching @ [facility]  . CORONARY ARTERY BYPASS GRAFT N/A 07/04/2012   Procedure: CORONARY ARTERY BYPASS GRAFTING (CABG);  Surgeon: Rexene Alberts, MD;  Location: [facility];  Service: Open Heart Surgery;  Laterality: N/A;  x3 using right greater saphenous vein and left internal mammary.   . DENTAL SURGERY  04/2018   4 teeth removed  . INTRAOPERATIVE TRANSESOPHAGEAL ECHOCARDIOGRAM N/A 07/04/2012   Procedure: INTRAOPERATIVE TRANSESOPHAGEAL ECHOCARDIOGRAM;  Surgeon: Rexene Alberts, MD;  Location: [facility];  Service: Open Heart Surgery;  Laterality: N/A;  . LEFT HEART CATH AND CORS/GRAFTS ANGIOGRAPHY N/A 04/18/2017   Procedure: LEFT HEART CATH AND CORS/GRAFTS ANGIOGRAPHY;  Surgeon: Lorretta Harp, MD;  Location: [facility];  Service: Cardiovascular;  Laterality: N/A;  . LEFT HEART CATHETERIZATION WITH CORONARY ANGIOGRAM N/A 06/25/2012   Procedure: LEFT HEART CATHETERIZATION WITH CORONARY ANGIOGRAM;  Surgeon: Lorretta Harp, MD;  Location: [facility] CATH LAB;  Service: Cardiovascular;  Laterality: N/A;  . TOOTH EXTRACTION  04/2018   4 teeth pulled    Social History:   reports that he has never smoked. He has never used smokeless tobacco. He reports that he does not drink alcohol or use drugs.  Family History   Problem Relation Age of Onset  . Cancer Mother   . Diabetes Sister   . Diabetes Brother     Medications: Patient's Medications  New Prescriptions   No medications on file  Previous Medications   ACETAMINOPHEN (TYLENOL) 500 MG TABLET    Take 500 mg by mouth as needed for moderate pain.   ALBUTEROL (PROVENTIL HFA;VENTOLIN HFA) 108 (90 BASE) MCG/ACT INHALER    Inhale 2 puffs into the lungs every 6 (six) hours as needed for wheezing or shortness of breath.   ASPIRIN 81 MG CHEWABLE TABLET    Chew 81 mg by mouth daily.    ATORVASTATIN (LIPITOR) 80 MG TABLET    Take 1 tablet (80 mg total) by mouth daily.   BLOOD GLUCOSE MONITORING SUPPL (CONTOUR NEXT EZ MONITOR) W/DEVICE KIT    Test blood sugar three times daily E11.22   BUSPIRONE (BUSPAR) 15 MG TABLET    TAKE 1 TABLET BY MOUTH THREE TIMES DAILY FOR ANXIETY   CLOPIDOGREL (PLAVIX) 75 MG TABLET    Take one tablet by mouth once daily   DULOXETINE (CYMBALTA) 60 MG CAPSULE    Take 1 capsule (60 mg total) by mouth daily.   GLUCOSE BLOOD (BAYER CONTOUR NEXT TEST) TEST STRIP    Use as instructed   INSULIN GLARGINE, 1 UNIT DIAL, (TOUJEO SOLOSTAR) 300 UNIT/ML SOPN    Inject 56 Units into the skin daily.   INSULIN PEN NEEDLE 32G X 4 MM MISC    Use as Directed. Dx: E11.40   LISINOPRIL (PRINIVIL,ZESTRIL) 5 MG TABLET    TAKE 1 TABLET(5 MG) BY MOUTH DAILY   MELOXICAM (MOBIC) 15 MG TABLET    Take 1 tablet (15 mg total) by mouth daily.   METFORMIN (GLUCOPHAGE) 1000 MG TABLET    TAKE 1 TABLET(1000 MG) BY MOUTH TWICE DAILY   METOPROLOL TARTRATE (LOPRESSOR) 25 MG TABLET    Take 0.5 tablets (12.5 mg total) by mouth 2 (two) times daily.   OMEPRAZOLE (PRILOSEC) 40 MG CAPSULE    Take 1 capsule (40 mg total) by mouth daily.   THIAMINE (VITAMIN B-1) 100 MG TABLET    Take 100 mg by mouth daily.  Modified Medications   No medications on file  Discontinued Medications   IBUPROFEN (ADVIL) 200 MG TABLET    Take 1 tablet (200 mg total) by mouth every 8 (eight) hours as  needed for mild pain or moderate pain.    Physical Exam:  Vitals:   10/23/18 0936  BP: 124/82  Pulse: 75  Temp: 97.8 F (36.6 C)  TempSrc: Temporal  SpO2: 97%  Weight: 266 lb (120.7 kg)  Height: '5\' 6"'  (1.676 m)   Body mass index is 42.93 kg/m. Wt Readings from Last 3 Encounters:  10/23/18 266 lb (120.7 kg)  10/12/18 271 lb (122.9 kg)  09/22/18 271 lb 9.6 oz (123.2 kg)    Physical Exam Constitutional:      Appearance: He is well-developed.  HENT:     Head:  Normocephalic and atraumatic.  Eyes:     General: No scleral icterus.    Pupils: Pupils are equal, round, and reactive to light.  Neck:     Musculoskeletal: Neck supple.     Thyroid: No thyromegaly.     Vascular: No carotid bruit.  Cardiovascular:     Rate and Rhythm: Normal rate and regular rhythm.     Heart sounds: Murmur present. Systolic murmur present with a grade of 1/6. No friction rub. No gallop.      Comments: no distal LE swelling. No calf TTP Pulmonary:     Effort: Pulmonary effort is normal.     Breath sounds: Normal breath sounds. No wheezing or rales.  Chest:     Chest wall: No tenderness.  Abdominal:     General: Bowel sounds are normal. There is no distension or abdominal bruit.     Palpations: Abdomen is soft. There is no hepatomegaly, mass or pulsatile mass.     Tenderness: There is no abdominal tenderness. There is no guarding or rebound.     Comments: obese  Lymphadenopathy:     Cervical: No cervical adenopathy.  Skin:    General: Skin is warm and dry.     Findings: No rash.  Neurological:     Mental Status: He is alert and oriented to person, place, and time.     Deep Tendon Reflexes: Reflexes are normal and symmetric.  Psychiatric:        Behavior: Behavior normal.        Thought Content: Thought content normal.        Judgment: Judgment normal.     Labs reviewed: Basic Metabolic Panel: Recent Labs    06/15/18 0822 07/14/18 0812 10/12/18 0916  NA 138 137 137  K 4.4 4.5  4.2  CL 103 102 104  CO2 '25 29 26  ' GLUCOSE 117* 196* 155*  BUN '16 14 15  ' CREATININE 0.99 0.85 0.97  CALCIUM 8.9 9.6 9.4   Liver Function Tests: Recent Labs    06/15/18 0822 07/14/18 0812 10/12/18 0916  AST 46* 44* 42*  ALT 39 48* 43  ALKPHOS  --  95 87  BILITOT 0.7 0.6 0.6  PROT 6.9 7.2 7.3  ALBUMIN  --  3.9 4.0   No results for input(s): LIPASE, AMYLASE in the last 8760 hours. No results for input(s): AMMONIA in the last 8760 hours. CBC: Recent Labs    06/15/18 0822 06/20/18 0908 07/14/18 0812 10/12/18 0916  WBC 5.2 6.0 5.0 5.4  NEUTROABS 3,359  --  3.2 3.3  HGB 12.2* 12.8* 12.2* 12.6*  HCT 37.5* 39.4 37.5* 39.0  MCV 89.7 91.4 91.2 90.7  PLT 78* 90* 81* 85*   Lipid Panel: Recent Labs    06/15/18 0822  CHOL 116  HDL 35*  LDLCALC 63  TRIG 92  CHOLHDL 3.3   TSH: No results for input(s): TSH in the last 8760 hours. A1C: Lab Results  Component Value Date   HGBA1C 9.2 (H) 09/18/2018     Assessment/Plan 1. Need for influenza vaccination - Flu Vaccine QUAD High Dose(Fluad)  2. Type 2 diabetes mellitus with diabetic neuropathy, with long-term current use of insulin ([place]) -reports blood sugars have improved but he also notes feeling weak and shaky due to low blood sugar at a cbg of 120 but does not eat consistently. Recommended a better eating schedule- will go hours without eating and does not eat proper protein. Recommend nutritionist but will wait until he gets  insurance. Will continue to work on diet and increase in physical activity at this time. To continue toujeo at this time.  -if A1c has not improved with next check will send to endocrine- he should have insurance at this time. - Flu Vaccine QUAD High Dose(Fluad) - Hemoglobin A1c; Future  3. Depression, recurrent ([place]) -slight improvement in mood with cymbalta- tolerating medication at this time.   4. Chronic pain syndrome -ongoing, reinforced max dose of tylenol and to avoid other NSAIDS while on  mobic 15 mg daily  5. Morbid obesity due to excess calories ([place]) -ongoing, encouraged weight loss with proper diet and exercise.   6. Anemia with low platelet count ([employer]) -following with hematology at this time. Recent infusing which has helped energy  7. Hyperlipidemia LDL goal <70 Continue to work on diet and lipitor 80 mg daily  - COMPLETE METABOLIC PANEL WITH GFR; Future  Next appt: 2 months with labs prior to visit Barry [name] K. [place], [facility] [phone]

## 2018-11-21 ENCOUNTER — Telehealth: Payer: Self-pay | Admitting: *Deleted

## 2018-11-21 NOTE — Telephone Encounter (Signed)
How long has he been on the cymbalta? He may need additional medication but I would need a visit with him to discuss.  Thanks.

## 2018-11-21 NOTE — Telephone Encounter (Signed)
Patient scheduled an appointment for 11/2 with [name].

## 2018-11-21 NOTE — Telephone Encounter (Signed)
Patient called and stated that he doesn't feel like the Cymbalta is helping him. Stated that he is so stressed in the mornings and evenings he could explode. Stated that he can't even stand himself he's so frustrated. Stated that he has an appointment next month to follow up but he feels like his medication needs to be changed or increased. Please Advise.

## 2018-11-27 ENCOUNTER — Other Ambulatory Visit: Payer: Self-pay

## 2018-11-27 ENCOUNTER — Encounter: Payer: Self-pay | Admitting: Nurse Practitioner

## 2018-11-27 ENCOUNTER — Ambulatory Visit (INDEPENDENT_AMBULATORY_CARE_PROVIDER_SITE_OTHER): Payer: Self-pay | Admitting: Nurse Practitioner

## 2018-11-27 VITALS — BP 132/80 | HR 62 | Temp 97.1°F | Ht 66.0 in | Wt 270.0 lb

## 2018-11-27 DIAGNOSIS — K219 Gastro-esophageal reflux disease without esophagitis: Secondary | ICD-10-CM

## 2018-11-27 DIAGNOSIS — F329 Major depressive disorder, single episode, unspecified: Secondary | ICD-10-CM

## 2018-11-27 DIAGNOSIS — F32A Depression, unspecified: Secondary | ICD-10-CM

## 2018-11-27 DIAGNOSIS — F419 Anxiety disorder, unspecified: Secondary | ICD-10-CM

## 2018-11-27 MED ORDER — PANTOPRAZOLE SODIUM 40 MG PO TBEC: 40.0000 mg | DELAYED_RELEASE_TABLET | Freq: Every day | ORAL | 3 refills | Status: DC

## 2018-11-27 MED ORDER — SERTRALINE HCL 100 MG PO TABS: 200.0000 mg | ORAL_TABLET | Freq: Every day | ORAL | 3 refills | Status: DC

## 2018-11-27 NOTE — Patient Instructions (Addendum)
Recommend to see psychologist at this time, if you feel like medication is not helping to see psychiatrist.  To start a gratitude journal, sit outside for 15 mins every day, to start exercising 20 mins 4 days a week   STOP Cymbalta START Zoloft 200 mg daily    STOP omeprazole (this interacts with plavix)  START protonix

## 2018-11-27 NOTE — Progress Notes (Signed)
Careteam: Patient Care Team: [name], NP as PCP - General (Geriatric Medicine) Lorretta Harp, MD as Consulting Physician (Cardiology)  Advanced Directive information    No Known Allergies  Chief Complaint  Patient presents with  . Acute Visit    Medication concerns     HPI: Patient is a 63 y.o. male seen in the office today for multiple concerns Pt reports he can not stand him self. Lots of stress. Not handling himself.  Reports sleeping better.  Still has pain but getting insurance through disability.  Has a problem with "Me" Snaps at everything. Increase irritability.  Will tell someone exactly how he thinks and "does not care"  Zoloft was switched to cymbalta at the end of august- does not feel like this has helped his pain at all. Overall mood is some worse with the change.  Continues to buspirone 15 mg TID.   Dealing with situations at home that have caused extra stress- ex-wife had a stroke 11 months ago and he is primary caregiver. Takes care of her and all the house work.  Has some people that are planning to come help take the load off of him.    Review of Systems:  Review of Systems  Constitutional: Negative for chills, fever and malaise/fatigue.  Musculoskeletal: Positive for back pain, joint pain and myalgias.  Psychiatric/Behavioral: Positive for depression. The patient is nervous/anxious and has insomnia.    Past Medical History:  Diagnosis Date  . Coronary artery disease   . MI (myocardial infarction) ([place]) 04/23/2007   inferior wall  . Morbid obesity ([place]) 06/26/2012  . S/P CABG x 3 07/04/2012   LIMA to LAD, SVG to D1, SVG to PDA, EVH via right thigh  . Sleep apnea   . Type II or unspecified type diabetes mellitus without mention of complication, not stated as uncontrolled   . Unspecified essential hypertension    Past Surgical History:  Procedure Laterality Date  . CORONARY ANGIOPLASTY WITH STENT PLACEMENT  04/23/2007   PCI and  stenting of mid RCA - Dr Donnetta Hutching @ [facility]  . CORONARY ARTERY BYPASS GRAFT N/A 07/04/2012   Procedure: CORONARY ARTERY BYPASS GRAFTING (CABG);  Surgeon: Rexene Alberts, MD;  Location: [place];  Service: Open Heart Surgery;  Laterality: N/A;  x3 using right greater saphenous vein and left internal mammary.   . DENTAL SURGERY  04/2018   4 teeth removed  . INTRAOPERATIVE TRANSESOPHAGEAL ECHOCARDIOGRAM N/A 07/04/2012   Procedure: INTRAOPERATIVE TRANSESOPHAGEAL ECHOCARDIOGRAM;  Surgeon: Rexene Alberts, MD;  Location: [place];  Service: Open Heart Surgery;  Laterality: N/A;  . LEFT HEART CATH AND CORS/GRAFTS ANGIOGRAPHY N/A 04/18/2017   Procedure: LEFT HEART CATH AND CORS/GRAFTS ANGIOGRAPHY;  Surgeon: Lorretta Harp, MD;  Location: [place] CV LAB;  Service: Cardiovascular;  Laterality: N/A;  . LEFT HEART CATHETERIZATION WITH CORONARY ANGIOGRAM N/A 06/25/2012   Procedure: LEFT HEART CATHETERIZATION WITH CORONARY ANGIOGRAM;  Surgeon: Lorretta Harp, MD;  Location: [facility] CATH LAB;  Service: Cardiovascular;  Laterality: N/A;  . TOOTH EXTRACTION  04/2018   4 teeth pulled    Social History:   reports that he has never smoked. He has never used smokeless tobacco. He reports that he does not drink alcohol or use drugs.  Family History  Problem Relation Age of Onset  . Cancer Mother   . Diabetes Sister   . Diabetes Brother     Medications: Patient's Medications  New Prescriptions   No medications on  file  Previous Medications   ACETAMINOPHEN (TYLENOL) 500 MG TABLET    Take 500 mg by mouth as needed for moderate pain.   ALBUTEROL (PROVENTIL HFA;VENTOLIN HFA) 108 (90 BASE) MCG/ACT INHALER    Inhale 2 puffs into the lungs every 6 (six) hours as needed for wheezing or shortness of breath.   ASPIRIN 81 MG CHEWABLE TABLET    Chew 81 mg by mouth daily.    ATORVASTATIN (LIPITOR) 80 MG TABLET    Take 1 tablet (80 mg total) by mouth daily.   BLOOD GLUCOSE MONITORING SUPPL (CONTOUR NEXT EZ MONITOR) W/DEVICE KIT     Test blood sugar three times daily E11.22   BUSPIRONE (BUSPAR) 15 MG TABLET    TAKE 1 TABLET BY MOUTH THREE TIMES DAILY FOR ANXIETY   CLOPIDOGREL (PLAVIX) 75 MG TABLET    Take one tablet by mouth once daily   DULOXETINE (CYMBALTA) 60 MG CAPSULE    Take 1 capsule (60 mg total) by mouth daily.   GLUCOSE BLOOD (BAYER CONTOUR NEXT TEST) TEST STRIP    Use as instructed   INSULIN GLARGINE, 1 UNIT DIAL, (TOUJEO SOLOSTAR) 300 UNIT/ML SOPN    Inject 56 Units into the skin daily.   INSULIN PEN NEEDLE 32G X 4 MM MISC    Use as Directed. Dx: E11.40   LISINOPRIL (PRINIVIL,ZESTRIL) 5 MG TABLET    TAKE 1 TABLET(5 MG) BY MOUTH DAILY   MELOXICAM (MOBIC) 15 MG TABLET    Take 1 tablet (15 mg total) by mouth daily.   METFORMIN (GLUCOPHAGE) 1000 MG TABLET    TAKE 1 TABLET(1000 MG) BY MOUTH TWICE DAILY   METOPROLOL TARTRATE (LOPRESSOR) 25 MG TABLET    Take 0.5 tablets (12.5 mg total) by mouth 2 (two) times daily.   OMEPRAZOLE (PRILOSEC) 40 MG CAPSULE    Take 1 capsule (40 mg total) by mouth daily.   THIAMINE (VITAMIN B-1) 100 MG TABLET    Take 100 mg by mouth daily.  Modified Medications   No medications on file  Discontinued Medications   No medications on file    Physical Exam:  Vitals:   11/27/18 1416  BP: 132/80  Pulse: 62  Temp: (!) 97.1 F (36.2 C)  TempSrc: Temporal  SpO2: 94%  Weight: 270 lb (122.5 kg)  Height: '5\' 6"'  (1.676 m)   Body mass index is 43.58 kg/m. Wt Readings from Last 3 Encounters:  11/27/18 270 lb (122.5 kg)  10/23/18 266 lb (120.7 kg)  10/12/18 271 lb (122.9 kg)    Physical Exam Constitutional:      Appearance: Normal appearance.  Cardiovascular:     Rate and Rhythm: Normal rate and regular rhythm.  Pulmonary:     Effort: Pulmonary effort is normal.     Breath sounds: Normal breath sounds.  Neurological:     General: No focal deficit present.     Mental Status: He is alert and oriented to person, place, and time.  Psychiatric:        Mood and Affect: Mood normal.         Behavior: Behavior normal.     Labs reviewed: Basic Metabolic Panel: Recent Labs    06/15/18 0822 07/14/18 0812 10/12/18 0916  NA 138 137 137  K 4.4 4.5 4.2  CL 103 102 104  CO2 '25 29 26  ' GLUCOSE 117* 196* 155*  BUN '16 14 15  ' CREATININE 0.99 0.85 0.97  CALCIUM 8.9 9.6 9.4   Liver Function Tests: Recent Labs  06/15/18 0822 07/14/18 0812 10/12/18 0916  AST 46* 44* 42*  ALT 39 48* 43  ALKPHOS  --  95 87  BILITOT 0.7 0.6 0.6  PROT 6.9 7.2 7.3  ALBUMIN  --  3.9 4.0   No results for input(s): LIPASE, AMYLASE in the last 8760 hours. No results for input(s): AMMONIA in the last 8760 hours. CBC: Recent Labs    06/15/18 0822 06/20/18 0908 07/14/18 0812 10/12/18 0916  WBC 5.2 6.0 5.0 5.4  NEUTROABS 3,359  --  3.2 3.3  HGB 12.2* 12.8* 12.2* 12.6*  HCT 37.5* 39.4 37.5* 39.0  MCV 89.7 91.4 91.2 90.7  PLT 78* 90* 81* 85*   Lipid Panel: Recent Labs    06/15/18 0822  CHOL 116  HDL 35*  LDLCALC 63  TRIG 92  CHOLHDL 3.3   TSH: No results for input(s): TSH in the last 8760 hours. A1C: Lab Results  Component Value Date   HGBA1C 9.2 (H) 09/18/2018     Assessment/Plan 1. Gastroesophageal reflux disease -omeprazole interacts with Plavix so will DC at this time and have him start protonix for GERD - pantoprazole (PROTONIX) 40 MG tablet; Take 1 tablet (40 mg total) by mouth daily.  Dispense: 30 tablet; Refill: 3  2. Anxiety and depression -increase in irritability on cymbalta, will have him stop at this time and restart zoloft at 200 mg by mouth daily.  -Recommend to see psychologist at this time, if you feel like medication is not helping to see psychiatrist.  To start a gratitude journal, sit outside for 15 mins every day, to start exercising 20 mins 4 days a week  - sertraline (ZOLOFT) 100 MG tablet; Take 2 tablets (200 mg total) by mouth daily.  Dispense: 60 tablet; Refill: 3  Next appt: to keep as scheduled. 12/20/2018 Barry [name]. [place], [facility] [phone]

## 2018-12-20 ENCOUNTER — Other Ambulatory Visit: Payer: Self-pay

## 2018-12-20 ENCOUNTER — Other Ambulatory Visit: Payer: Medicaid Other

## 2018-12-20 DIAGNOSIS — E1140 Type 2 diabetes mellitus with diabetic neuropathy, unspecified: Secondary | ICD-10-CM

## 2018-12-20 DIAGNOSIS — Z794 Long term (current) use of insulin: Secondary | ICD-10-CM

## 2018-12-20 DIAGNOSIS — E785 Hyperlipidemia, unspecified: Secondary | ICD-10-CM

## 2018-12-21 LAB — HEMOGLOBIN A1C
Hgb A1c MFr Bld: 8.7 % of total Hgb — ABNORMAL HIGH (ref <5.7)
Mean Plasma Glucose: 203 (calc)
eAG (mmol/L): 11.2 (calc)

## 2018-12-21 LAB — COMPLETE METABOLIC PANEL WITH GFR
AG Ratio: 1.2 (calc) (ref 1.0–2.5)
ALT: 75 U/L — ABNORMAL HIGH (ref 9–46)
AST: 57 U/L — ABNORMAL HIGH (ref 10–35)
Albumin: 4.1 g/dL (ref 3.6–5.1)
Alkaline phosphatase (APISO): 96 U/L (ref 35–144)
BUN: 21 mg/dL (ref 7–25)
CO2: 27 mmol/L (ref 20–32)
Calcium: 9.2 mg/dL (ref 8.6–10.3)
Chloride: 100 mmol/L (ref 98–110)
Creat: 0.98 mg/dL (ref 0.70–1.25)
GFR, Est African American: 96 mL/min/{1.73_m2} (ref >=60)
GFR, Est Non African American: 83 mL/min/{1.73_m2} (ref >=60)
Globulin: 3.3 g/dL (calc) (ref 1.9–3.7)
Glucose, Bld: 140 mg/dL — ABNORMAL HIGH (ref 65–99)
Potassium: 5.4 mmol/L — ABNORMAL HIGH (ref 3.5–5.3)
Sodium: 135 mmol/L (ref 135–146)
Total Bilirubin: 0.6 mg/dL (ref 0.2–1.2)
Total Protein: 7.4 g/dL (ref 6.1–8.1)

## 2018-12-25 ENCOUNTER — Other Ambulatory Visit: Payer: Self-pay

## 2018-12-25 ENCOUNTER — Ambulatory Visit (INDEPENDENT_AMBULATORY_CARE_PROVIDER_SITE_OTHER): Payer: Self-pay | Admitting: Nurse Practitioner

## 2018-12-25 ENCOUNTER — Encounter: Payer: Self-pay | Admitting: Nurse Practitioner

## 2018-12-25 VITALS — BP 122/74 | HR 64 | Temp 98.7°F | Ht 66.0 in | Wt 268.0 lb

## 2018-12-25 DIAGNOSIS — M2550 Pain in unspecified joint: Secondary | ICD-10-CM

## 2018-12-25 DIAGNOSIS — F329 Major depressive disorder, single episode, unspecified: Secondary | ICD-10-CM

## 2018-12-25 DIAGNOSIS — I2510 Atherosclerotic heart disease of native coronary artery without angina pectoris: Secondary | ICD-10-CM

## 2018-12-25 DIAGNOSIS — K219 Gastro-esophageal reflux disease without esophagitis: Secondary | ICD-10-CM

## 2018-12-25 DIAGNOSIS — E785 Hyperlipidemia, unspecified: Secondary | ICD-10-CM

## 2018-12-25 DIAGNOSIS — F32A Depression, unspecified: Secondary | ICD-10-CM

## 2018-12-25 DIAGNOSIS — R748 Abnormal levels of other serum enzymes: Secondary | ICD-10-CM

## 2018-12-25 DIAGNOSIS — G894 Chronic pain syndrome: Secondary | ICD-10-CM

## 2018-12-25 DIAGNOSIS — E1140 Type 2 diabetes mellitus with diabetic neuropathy, unspecified: Secondary | ICD-10-CM

## 2018-12-25 DIAGNOSIS — E1165 Type 2 diabetes mellitus with hyperglycemia: Secondary | ICD-10-CM

## 2018-12-25 DIAGNOSIS — Z794 Long term (current) use of insulin: Secondary | ICD-10-CM

## 2018-12-25 DIAGNOSIS — F419 Anxiety disorder, unspecified: Secondary | ICD-10-CM

## 2018-12-25 MED ORDER — TOUJEO SOLOSTAR 300 UNIT/ML ~~LOC~~ SOPN: 60.0000 [IU] | PEN_INJECTOR | Freq: Every day | SUBCUTANEOUS | 5 refills | Status: DC

## 2018-12-25 MED ORDER — ROSUVASTATIN CALCIUM 40 MG PO TABS: 40.0000 mg | ORAL_TABLET | Freq: Every day | ORAL | 3 refills | Status: DC

## 2018-12-25 MED ORDER — MELOXICAM 15 MG PO TABS: 15.0000 mg | ORAL_TABLET | Freq: Every day | ORAL | 5 refills | Status: AC

## 2018-12-25 NOTE — Progress Notes (Signed)
Careteam: Patient Care Team: [name], NP as PCP - General (Geriatric Medicine) Lorretta Harp, MD as Consulting Physician (Cardiology)  Advanced Directive information    No Known Allergies  Chief Complaint  Patient presents with  . Medical Management of Chronic Issues    2 month follow-up and discuss labs(copy printed)   . Medication Refill    Renew Mobic   . Best Practice Recommendations    Discuss HIV screening reccomendation   . Quality Metric Gaps    Discuss need for eye exam. Foot exam due      HPI: Patient is a 63 y.o. male seen in the office today for routine follow up.   DM- A1c at 8.7, blood sugars this morning 175, avg 170-205. Taking toujeo 56 units also taking metformin 1000 mg by mouth twice daily. Gets medication through med assistance due to no insurance.   GERD- controlled on protonix  Anxiety and depression- switched back to zoloft from Cymbalta. Doing much better, situation at home has improved. Doing much better.   Morbid obesity- ongoing. No routine exercise.   Hoping to get insurance at the beginning of the year- did qualify for disability.   Hyperlipidemia- taking lipitor 80 mg   Elevated liver enzymes- does not drink etoh, fatty liver noted on ultrasound.   Back pain- improved significantly with mobic 15 mg daily.   Review of Systems:  Review of Systems  Constitutional: Negative for chills, fever and weight loss.  HENT: Negative for tinnitus.   Respiratory: Negative for cough, sputum production and shortness of breath.   Cardiovascular: Negative for chest pain, palpitations and leg swelling.  Gastrointestinal: Negative for abdominal pain, constipation, diarrhea and heartburn.  Genitourinary: Negative for dysuria, frequency and urgency.  Musculoskeletal: Positive for back pain, joint pain and myalgias. Negative for falls.       Improved with mobic  Skin: Negative.   Neurological: Negative for dizziness and headaches.   Psychiatric/Behavioral: Positive for depression. Negative for memory loss. The patient is nervous/anxious. The patient does not have insomnia.        Anxiety and depression better controlled now back on zoloft    Past Medical History:  Diagnosis Date  . Coronary artery disease   . MI (myocardial infarction) ([place]) 04/23/2007   inferior wall  . Morbid obesity ([place]) 06/26/2012  . S/P CABG x 3 07/04/2012   LIMA to LAD, SVG to D1, SVG to PDA, EVH via right thigh  . Sleep apnea   . Type II or unspecified type diabetes mellitus without mention of complication, not stated as uncontrolled   . Unspecified essential hypertension    Past Surgical History:  Procedure Laterality Date  . CORONARY ANGIOPLASTY WITH STENT PLACEMENT  04/23/2007   PCI and stenting of mid RCA - Dr Donnetta Hutching @ [facility]  . CORONARY ARTERY BYPASS GRAFT N/A 07/04/2012   Procedure: CORONARY ARTERY BYPASS GRAFTING (CABG);  Surgeon: Rexene Alberts, MD;  Location: [place];  Service: Open Heart Surgery;  Laterality: N/A;  x3 using right greater saphenous vein and left internal mammary.   . DENTAL SURGERY  04/2018   4 teeth removed  . INTRAOPERATIVE TRANSESOPHAGEAL ECHOCARDIOGRAM N/A 07/04/2012   Procedure: INTRAOPERATIVE TRANSESOPHAGEAL ECHOCARDIOGRAM;  Surgeon: Rexene Alberts, MD;  Location: [place];  Service: Open Heart Surgery;  Laterality: N/A;  . LEFT HEART CATH AND CORS/GRAFTS ANGIOGRAPHY N/A 04/18/2017   Procedure: LEFT HEART CATH AND CORS/GRAFTS ANGIOGRAPHY;  Surgeon: Lorretta Harp, MD;  Location: [facility]  INVASIVE CV LAB;  Service: Cardiovascular;  Laterality: N/A;  . LEFT HEART CATHETERIZATION WITH CORONARY ANGIOGRAM N/A 06/25/2012   Procedure: LEFT HEART CATHETERIZATION WITH CORONARY ANGIOGRAM;  Surgeon: Lorretta Harp, MD;  Location: [facility] CATH LAB;  Service: Cardiovascular;  Laterality: N/A;  . TOOTH EXTRACTION  04/2018   4 teeth pulled    Social History:   reports that he has never smoked. He has never used smokeless tobacco. He  reports that he does not drink alcohol or use drugs.  Family History  Problem Relation Age of Onset  . Cancer Mother   . Diabetes Sister   . Diabetes Brother     Medications: Patient's Medications  New Prescriptions   No medications on file  Previous Medications   ACETAMINOPHEN (TYLENOL) 500 MG TABLET    Take 500 mg by mouth as needed for moderate pain.   ALBUTEROL (PROVENTIL HFA;VENTOLIN HFA) 108 (90 BASE) MCG/ACT INHALER    Inhale 2 puffs into the lungs every 6 (six) hours as needed for wheezing or shortness of breath.   ASPIRIN 81 MG CHEWABLE TABLET    Chew 81 mg by mouth daily.    ATORVASTATIN (LIPITOR) 80 MG TABLET    Take 1 tablet (80 mg total) by mouth daily.   BLOOD GLUCOSE MONITORING SUPPL (CONTOUR NEXT EZ MONITOR) W/DEVICE KIT    Test blood sugar three times daily E11.22   BUSPIRONE (BUSPAR) 15 MG TABLET    TAKE 1 TABLET BY MOUTH THREE TIMES DAILY FOR ANXIETY   CLOPIDOGREL (PLAVIX) 75 MG TABLET    Take one tablet by mouth once daily   GLUCOSE BLOOD (BAYER CONTOUR NEXT TEST) TEST STRIP    Use as instructed   INSULIN GLARGINE, 1 UNIT DIAL, (TOUJEO SOLOSTAR) 300 UNIT/ML SOPN    Inject 56 Units into the skin daily.   INSULIN PEN NEEDLE 32G X 4 MM MISC    Use as Directed. Dx: E11.40   LISINOPRIL (PRINIVIL,ZESTRIL) 5 MG TABLET    TAKE 1 TABLET(5 MG) BY MOUTH DAILY   MELOXICAM (MOBIC) 15 MG TABLET    Take 1 tablet (15 mg total) by mouth daily.   METFORMIN (GLUCOPHAGE) 1000 MG TABLET    TAKE 1 TABLET(1000 MG) BY MOUTH TWICE DAILY   METOPROLOL TARTRATE (LOPRESSOR) 25 MG TABLET    Take 0.5 tablets (12.5 mg total) by mouth 2 (two) times daily.   PANTOPRAZOLE (PROTONIX) 40 MG TABLET    Take 1 tablet (40 mg total) by mouth daily.   SERTRALINE (ZOLOFT) 100 MG TABLET    Take 2 tablets (200 mg total) by mouth daily.   THIAMINE (VITAMIN B-1) 100 MG TABLET    Take 100 mg by mouth daily.  Modified Medications   No medications on file  Discontinued Medications   OMEPRAZOLE (PRILOSEC) 40 MG  CAPSULE    Take 1 capsule (40 mg total) by mouth daily.    Physical Exam:  Vitals:   12/25/18 0915  BP: 122/74  Pulse: 64  Temp: 98.7 F (37.1 C)  TempSrc: Temporal  SpO2: 96%  Weight: 268 lb (121.6 kg)  Height: '5\' 6"'  (1.676 m)   Body mass index is 43.26 kg/m. Wt Readings from Last 3 Encounters:  12/25/18 268 lb (121.6 kg)  11/27/18 270 lb (122.5 kg)  10/23/18 266 lb (120.7 kg)    Physical Exam Constitutional:      General: He is not in acute distress.    Appearance: He is well-developed. He is not diaphoretic.  HENT:  Head: Normocephalic and atraumatic.     Mouth/Throat:     Pharynx: No oropharyngeal exudate.  Eyes:     Comments: prosthetic right eye  Neck:     Musculoskeletal: Normal range of motion and neck supple.  Cardiovascular:     Rate and Rhythm: Normal rate and regular rhythm.     Heart sounds: Normal heart sounds.  Pulmonary:     Effort: Pulmonary effort is normal.     Breath sounds: Normal breath sounds.  Abdominal:     General: Abdomen is protuberant. Bowel sounds are normal.     Palpations: Abdomen is soft.  Musculoskeletal:     Right lower leg: No edema.     Left lower leg: No edema.  Feet:     Right foot:     Protective Sensation: 3 sites tested. 3 sites sensed.     Skin integrity: Skin integrity normal.     Left foot:     Protective Sensation: 3 sites tested. 3 sites sensed.     Skin integrity: Skin integrity normal.  Skin:    General: Skin is warm and dry.  Neurological:     Mental Status: He is alert and oriented to person, place, and time.     Labs reviewed: Basic Metabolic Panel: Recent Labs    07/14/18 0812 10/12/18 0916 12/20/18 0813  NA 137 137 135  K 4.5 4.2 5.4*  CL 102 104 100  CO2 '29 26 27  ' GLUCOSE 196* 155* 140*  BUN '14 15 21  ' CREATININE 0.85 0.97 0.98  CALCIUM 9.6 9.4 9.2   Liver Function Tests: Recent Labs    07/14/18 0812 10/12/18 0916 12/20/18 0813  AST 44* 42* 57*  ALT 48* 43 75*  ALKPHOS 95 87   --   BILITOT 0.6 0.6 0.6  PROT 7.2 7.3 7.4  ALBUMIN 3.9 4.0  --    No results for input(s): LIPASE, AMYLASE in the last 8760 hours. No results for input(s): AMMONIA in the last 8760 hours. CBC: Recent Labs    06/15/18 0822 06/20/18 0908 07/14/18 0812 10/12/18 0916  WBC 5.2 6.0 5.0 5.4  NEUTROABS 3,359  --  3.2 3.3  HGB 12.2* 12.8* 12.2* 12.6*  HCT 37.5* 39.4 37.5* 39.0  MCV 89.7 91.4 91.2 90.7  PLT 78* 90* 81* 85*   Lipid Panel: Recent Labs    06/15/18 0822  CHOL 116  HDL 35*  LDLCALC 63  TRIG 92  CHOLHDL 3.3   TSH: No results for input(s): TSH in the last 8760 hours. A1C: Lab Results  Component Value Date   HGBA1C 8.7 (H) 12/20/2018     Assessment/Plan 1. Pain in joint involving multiple sites -ongoing, improved on meloxicam, discuss the need for weight loss and strength training to help with pain.  - meloxicam (MOBIC) 15 MG tablet; Take 1 tablet (15 mg total) by mouth daily.  Dispense: 30 tablet; Refill: 5 - CBC with Differential/Platelet; Future  2. Chronic pain syndrome Improved on meloxicam - meloxicam (MOBIC) 15 MG tablet; Take 1 tablet (15 mg total) by mouth daily.  Dispense: 30 tablet; Refill: 5 - CBC with Differential/Platelet; Future  3. Type 2 diabetes mellitus with diabetic neuropathy, with long-term current use of insulin (HCC) -uncontrolled. Dicussed need for dietary modifications.  - Insulin Glargine, 1 Unit Dial, (TOUJEO SOLOSTAR) 300 UNIT/ML SOPN; Inject 60 Units into the skin daily.  Dispense: 20 pen; Refill: 5  4. Elevated liver enzymes -fatty liver noted on [place] in 2017. Discussed dietary  modification- low fat diet given. Will stop lipitor and change statin to crestor.  -no ETOH use.  - CMP with eGFR([facility]); Future in 6 weeks.   5. Morbid obesity due to excess calories ([place]) -discussed need for weight loss through diet and increase in activity  6. Hyperlipidemia LDL goal <70 -elevated liver enzymes. Will stop lipitor and start crestor  - rosuvastatin (CRESTOR) 40 MG tablet; Take 1 tablet (40 mg total) by mouth daily.  Dispense: 90 tablet; Refill: 3 - CMP with eGFR([facility]); Future - Lipid Panel; Future  7. Anxiety and depression -improved at this time. Will continue zoloft  - CBC with Differential/Platelet; Future  8. Gastroesophageal reflux disease without esophagitis Stable. Controlled on protonix  9. Coronary artery disease involving native coronary artery of native heart without angina pectoris Without chest pains. Continues on plavix  - CBC with Differential/Platelet; Future  10. Type 2 diabetes mellitus with hyperglycemia, with long-term current use of insulin (HCC) -blood sugars not at goal -Increase toujeo to 60 units, if fasting blood sugars continue to be over 120 after 1 week increase toujeo to 64 - Hemoglobin A1c; Future - CBC with Differential/Platelet; Future  Next appt: 3 months with lab work prior to appt 6 week lab to follow up liver enzyme Larry Alcock K. [place], [facility] [phone]

## 2018-12-25 NOTE — Patient Instructions (Addendum)
Lets stop Lipitor and change to Crestor 40  Follow up lipid and CMP in 3 months.  Increase physical activity- 30 mins 5 days a week. Weight loss will be beneficial for diabetes, cholesterol, liver, and back pain.    Increase toujeo to 60 units, if fasting blood sugars continue to be over 120 after 1 week increase toujeo to 64   Follow up lab work in 6 weeks to check on Liver enzymes  Follow up in 3 months with fasting lab work prior    Fat and Cholesterol Restricted Eating Plan Getting too much fat and cholesterol in your diet may cause health problems. Choosing the right foods helps keep your fat and cholesterol at normal levels. This can keep you from getting certain diseases.   What are tips for following this plan? Meal planning  At meals, divide your plate into four equal parts: ? Fill one-half of your plate with vegetables and green salads. ? Fill one-fourth of your plate with whole grains. ? Fill one-fourth of your plate with low-fat (lean) protein foods.  Eat fish that is high in omega-3 fats at least two times a week. This includes mackerel, tuna, sardines, and salmon.  Eat foods that are high in fiber, such as whole grains, beans, apples, broccoli, carrots, peas, and barley. General tips   Work with your doctor to lose weight if you need to.  Avoid: ? Foods with added sugar. ? Fried foods. ? Foods with partially hydrogenated oils.  Limit alcohol intake to no more than 1 drink a day for nonpregnant women and 2 drinks a day for men. One drink equals 12 oz of beer, 5 oz of wine, or 1 oz of hard liquor. Reading food labels  Check food labels for: ? Trans fats. ? Partially hydrogenated oils. ? Saturated fat (g) in each serving. ? Cholesterol (mg) in each serving. ? Fiber (g) in each serving.  Choose foods with healthy fats, such as: ? Monounsaturated fats. ? Polyunsaturated fats. ? Omega-3 fats.  Choose grain products that have whole grains. Look for the  word "whole" as the first word in the ingredient list. Cooking  Cook foods using low-fat methods. These include baking, boiling, grilling, and broiling.  Eat more home-cooked foods. Eat at restaurants and buffets less often.  Avoid cooking using saturated fats, such as butter, cream, palm oil, palm kernel oil, and coconut oil. Recommended foods  Fruits  All fresh, canned (in natural juice), or frozen fruits. Vegetables  Fresh or frozen vegetables (raw, steamed, roasted, or grilled). Green salads. Grains  Whole grains, such as whole wheat or whole grain breads, crackers, cereals, and pasta. Unsweetened oatmeal, bulgur, barley, quinoa, or brown rice. Corn or whole wheat flour tortillas. Meats and other protein foods  Ground beef (85% or leaner), grass-fed beef, or beef trimmed of fat. Skinless chicken or [place]. Ground chicken or [place]. Pork trimmed of fat. All fish and seafood. Egg whites. Dried beans, peas, or lentils. Unsalted nuts or seeds. Unsalted canned beans. Nut butters without added sugar or oil. Dairy  Low-fat or nonfat dairy products, such as skim or 1% milk, 2% or reduced-fat cheeses, low-fat and fat-free ricotta or cottage cheese, or plain low-fat and nonfat yogurt. Fats and oils  Tub margarine without trans fats. Light or reduced-fat mayonnaise and salad dressings. Avocado. Olive, canola, sesame, or safflower oils. The items listed above may not be a complete list of foods and beverages you can eat. Contact a dietitian for more information. Foods  to avoid Fruits  Canned fruit in heavy syrup. Fruit in cream or butter sauce. Fried fruit. Vegetables  Vegetables cooked in cheese, cream, or butter sauce. Fried vegetables. Grains  White bread. White pasta. White rice. Cornbread. Bagels, pastries, and croissants. Crackers and snack foods that contain trans fat and hydrogenated oils. Meats and other protein foods  Fatty cuts of meat. Ribs, chicken wings, bacon, sausage,  bologna, salami, chitterlings, fatback, hot dogs, bratwurst, and packaged lunch meats. Liver and organ meats. Whole eggs and egg yolks. Chicken and [place] with skin. Fried meat. Dairy  Whole or 2% milk, cream, half-and-half, and cream cheese. Whole milk cheeses. Whole-fat or sweetened yogurt. Full-fat cheeses. Nondairy creamers and whipped toppings. Processed cheese, cheese spreads, and cheese curds. Beverages  Alcohol. Sugar-sweetened drinks such as sodas, lemonade, and fruit drinks. Fats and oils  Butter, stick margarine, lard, shortening, ghee, or bacon fat. Coconut, palm kernel, and palm oils. Sweets and desserts  Corn syrup, sugars, honey, and molasses. Candy. Jam and jelly. Syrup. Sweetened cereals. Cookies, pies, cakes, donuts, muffins, and ice cream. The items listed above may not be a complete list of foods and beverages you should avoid. Contact a dietitian for more information. Summary  Choosing the right foods helps keep your fat and cholesterol at normal levels. This can keep you from getting certain diseases.  At meals, fill one-half of your plate with vegetables and green salads.  Eat high-fiber foods, like whole grains, beans, apples, carrots, peas, and barley.  Limit added sugar, saturated fats, alcohol, and fried foods. This information is not intended to replace advice given to you by your health care provider. Make sure you discuss any questions you have with your health care provider. Document Released: 07/13/2011 Document Revised: 09/14/2017 Document Reviewed: 09/28/2016 Elsevier Patient Education  2020 [employer] Heart-Healthy Eating Plan Many factors influence your heart (coronary) health, including eating and exercise habits. Coronary risk increases with abnormal blood fat (lipid) levels. Heart-healthy meal planning includes limiting unhealthy fats, increasing healthy fats, and making other diet and lifestyle changes. What is my plan? Your health care  provider may recommend that you:  Limit your fat intake to _________% or less of your total calories each day.  Limit your saturated fat intake to _________% or less of your total calories each day.  Limit the amount of cholesterol in your diet to less than _________ mg per day. What are tips for following this plan? Cooking Cook foods using methods other than frying. Baking, boiling, grilling, and broiling are all good options. Other ways to reduce fat include:  Removing the skin from poultry.  Removing all visible fats from meats.  Steaming vegetables in water or broth. Meal planning   At meals, imagine dividing your plate into fourths: ? Fill one-half of your plate with vegetables and green salads. ? Fill one-fourth of your plate with whole grains. ? Fill one-fourth of your plate with lean protein foods.  Eat 4-5 servings of vegetables per day. One serving equals 1 cup raw or cooked vegetable, or 2 cups raw leafy greens.  Eat 4-5 servings of fruit per day. One serving equals 1 medium whole fruit,  cup dried fruit,  cup fresh, frozen, or canned fruit, or  cup 100% fruit juice.  Eat more foods that contain soluble fiber. Examples include apples, broccoli, carrots, beans, peas, and barley. Aim to get 25-30 g of fiber per day.  Increase your consumption of legumes, nuts, and seeds to 4-5 servings per week. One  serving of dried beans or legumes equals  cup cooked, 1 serving of nuts is  cup, and 1 serving of seeds equals 1 tablespoon. Fats  Choose healthy fats more often. Choose monounsaturated and polyunsaturated fats, such as olive and canola oils, flaxseeds, walnuts, almonds, and seeds.  Eat more omega-3 fats. Choose salmon, mackerel, sardines, tuna, flaxseed oil, and ground flaxseeds. Aim to eat fish at least 2 times each week.  Check food labels carefully to identify foods with trans fats or high amounts of saturated fat.  Limit saturated fats. These are found in animal  products, such as meats, butter, and cream. Plant sources of saturated fats include palm oil, palm kernel oil, and coconut oil.  Avoid foods with partially hydrogenated oils in them. These contain trans fats. Examples are stick margarine, some tub margarines, cookies, crackers, and other baked goods.  Avoid fried foods. General information  Eat more home-cooked food and less restaurant, buffet, and fast food.  Limit or avoid alcohol.  Limit foods that are high in starch and sugar.  Lose weight if you are overweight. Losing just 5-10% of your body weight can help your overall health and prevent diseases such as diabetes and heart disease.  Monitor your salt (sodium) intake, especially if you have high blood pressure. Talk with your health care provider about your sodium intake.  Try to incorporate more vegetarian meals weekly. What foods can I eat? Fruits All fresh, canned (in natural juice), or frozen fruits. Vegetables Fresh or frozen vegetables (raw, steamed, roasted, or grilled). Green salads. Grains Most grains. Choose whole wheat and whole grains most of the time. Rice and pasta, including brown rice and pastas made with whole wheat. Meats and other proteins Lean, well-trimmed beef, veal, pork, and lamb. Chicken and [place] without skin. All fish and shellfish. Wild duck, rabbit, pheasant, and venison. Egg whites or low-cholesterol egg substitutes. Dried beans, peas, lentils, and tofu. Seeds and most nuts. Dairy Low-fat or nonfat cheeses, including ricotta and mozzarella. Skim or 1% milk (liquid, powdered, or evaporated). Buttermilk made with low-fat milk. Nonfat or low-fat yogurt. Fats and oils Non-hydrogenated (trans-free) margarines. Vegetable oils, including soybean, sesame, sunflower, olive, peanut, safflower, corn, canola, and cottonseed. Salad dressings or mayonnaise made with a vegetable oil. Beverages Water (mineral or sparkling). Coffee and tea. Diet carbonated  beverages. Sweets and desserts Sherbet, gelatin, and fruit ice. Small amounts of dark chocolate. Limit all sweets and desserts. Seasonings and condiments All seasonings and condiments. The items listed above may not be a complete list of foods and beverages you can eat. Contact a dietitian for more options. What foods are not recommended? Fruits Canned fruit in heavy syrup. Fruit in cream or butter sauce. Fried fruit. Limit coconut. Vegetables Vegetables cooked in cheese, cream, or butter sauce. Fried vegetables. Grains Breads made with saturated or trans fats, oils, or whole milk. Croissants. Sweet rolls. Donuts. High-fat crackers, such as cheese crackers. Meats and other proteins Fatty meats, such as hot dogs, ribs, sausage, bacon, rib-eye roast or steak. High-fat deli meats, such as salami and bologna. Caviar. Domestic duck and goose. Organ meats, such as liver. Dairy Cream, sour cream, cream cheese, and creamed cottage cheese. Whole milk cheeses. Whole or 2% milk (liquid, evaporated, or condensed). Whole buttermilk. Cream sauce or high-fat cheese sauce. Whole-milk yogurt. Fats and oils Meat fat, or shortening. Cocoa butter, hydrogenated oils, palm oil, coconut oil, palm kernel oil. Solid fats and shortenings, including bacon fat, salt pork, lard, and butter. Nondairy cream substitutes.  Salad dressings with cheese or sour cream. Beverages Regular sodas and any drinks with added sugar. Sweets and desserts Frosting. Pudding. Cookies. Cakes. Pies. Milk chocolate or white chocolate. Buttered syrups. Full-fat ice cream or ice cream drinks. The items listed above may not be a complete list of foods and beverages to avoid. Contact a dietitian for more information. Summary  Heart-healthy meal planning includes limiting unhealthy fats, increasing healthy fats, and making other diet and lifestyle changes.  Lose weight if you are overweight. Losing just 5-10% of your body weight can help your  overall health and prevent diseases such as diabetes and heart disease.  Focus on eating a balance of foods, including fruits and vegetables, low-fat or nonfat dairy, lean protein, nuts and legumes, whole grains, and heart-healthy oils and fats. This information is not intended to replace advice given to you by your health care provider. Make sure you discuss any questions you have with your health care provider. Document Released: 10/21/2007 Document Revised: 02/18/2017 Document Reviewed: 02/18/2017 Elsevier Patient Education  2020 [employer].

## 2019-01-12 ENCOUNTER — Telehealth: Payer: Self-pay | Admitting: Hematology & Oncology

## 2019-01-12 ENCOUNTER — Encounter: Payer: Self-pay | Admitting: Hematology & Oncology

## 2019-01-12 ENCOUNTER — Inpatient Hospital Stay (HOSPITAL_BASED_OUTPATIENT_CLINIC_OR_DEPARTMENT_OTHER): Payer: Self-pay | Admitting: Hematology & Oncology

## 2019-01-12 ENCOUNTER — Inpatient Hospital Stay: Payer: Self-pay | Attending: Family

## 2019-01-12 ENCOUNTER — Other Ambulatory Visit: Payer: Self-pay

## 2019-01-12 VITALS — BP 126/62 | HR 54 | Temp 98.6°F | Resp 18 | Wt 267.0 lb

## 2019-01-12 DIAGNOSIS — D508 Other iron deficiency anemias: Secondary | ICD-10-CM

## 2019-01-12 DIAGNOSIS — R161 Splenomegaly, not elsewhere classified: Secondary | ICD-10-CM | POA: Insufficient documentation

## 2019-01-12 DIAGNOSIS — D509 Iron deficiency anemia, unspecified: Secondary | ICD-10-CM | POA: Insufficient documentation

## 2019-01-12 DIAGNOSIS — E11641 Type 2 diabetes mellitus with hypoglycemia with coma: Secondary | ICD-10-CM

## 2019-01-12 DIAGNOSIS — D696 Thrombocytopenia, unspecified: Secondary | ICD-10-CM | POA: Insufficient documentation

## 2019-01-12 LAB — IRON AND TIBC
Iron: 78 ug/dL (ref 42–163)
Saturation Ratios: 21 % (ref 20–55)
TIBC: 378 ug/dL (ref 202–409)
UIBC: 300 ug/dL (ref 117–376)

## 2019-01-12 LAB — CBC WITH DIFFERENTIAL (CANCER CENTER ONLY)
Abs Immature Granulocytes: 0.02 10*3/uL (ref 0.00–0.07)
Basophils Absolute: 0.1 10*3/uL (ref 0.0–0.1)
Basophils Relative: 1 %
Eosinophils Absolute: 0.1 10*3/uL (ref 0.0–0.5)
Eosinophils Relative: 2 %
HCT: 43 % (ref 39.0–52.0)
Hemoglobin: 14 g/dL (ref 13.0–17.0)
Immature Granulocytes: 0 %
Lymphocytes Relative: 22 %
Lymphs Abs: 1.3 10*3/uL (ref 0.7–4.0)
MCH: 31 pg (ref 26.0–34.0)
MCHC: 32.6 g/dL (ref 30.0–36.0)
MCV: 95.3 fL (ref 80.0–100.0)
Monocytes Absolute: 0.5 10*3/uL (ref 0.1–1.0)
Monocytes Relative: 8 %
Neutro Abs: 4 10*3/uL (ref 1.7–7.7)
Neutrophils Relative %: 67 %
Platelet Count: 75 10*3/uL — ABNORMAL LOW (ref 150–400)
RBC: 4.51 MIL/uL (ref 4.22–5.81)
RDW: 14.9 % (ref 11.5–15.5)
WBC Count: 5.9 10*3/uL (ref 4.0–10.5)
nRBC: 0 % (ref 0.0–0.2)

## 2019-01-12 LAB — PLATELET BY CITRATE

## 2019-01-12 LAB — CMP (CANCER CENTER ONLY)
ALT: 57 U/L — ABNORMAL HIGH (ref 0–44)
AST: 50 U/L — ABNORMAL HIGH (ref 15–41)
Albumin: 4.3 g/dL (ref 3.5–5.0)
Alkaline Phosphatase: 81 U/L (ref 38–126)
Anion gap: 7 (ref 5–15)
BUN: 17 mg/dL (ref 8–23)
CO2: 31 mmol/L (ref 22–32)
Calcium: 9.6 mg/dL (ref 8.9–10.3)
Chloride: 101 mmol/L (ref 98–111)
Creatinine: 0.99 mg/dL (ref 0.61–1.24)
GFR, Est AFR Am: >60 mL/min (ref >60)
GFR, Estimated: >60 mL/min (ref >60)
Glucose, Bld: 169 mg/dL — ABNORMAL HIGH (ref 70–99)
Potassium: 4.5 mmol/L (ref 3.5–5.1)
Sodium: 139 mmol/L (ref 135–145)
Total Bilirubin: 0.6 mg/dL (ref 0.3–1.2)
Total Protein: 7.8 g/dL (ref 6.5–8.1)

## 2019-01-12 LAB — FERRITIN: Ferritin: 71 ng/mL (ref 24–336)

## 2019-01-12 NOTE — Telephone Encounter (Signed)
Appointments scheduled calendar printed per 12/18 los 

## 2019-01-12 NOTE — Progress Notes (Signed)
Hematology and Oncology Follow Up Visit  Barry [name] [id number] 1955/11/04 63 y.o. 01/12/2019   Principle Diagnosis:  Thrombocytopenia with mild splenomegaly  Iron deficiency anemia   Current Therapy:   Observations  IV iron as indicated   Interim History:  Barry [name] is here today for follow-up.  Of that we last saw him back in September.  At that time, he felt pretty well.  He did have low iron back in June.  At that time his ferritin was 11 with iron saturation of 14%.  He has been busy doing things around the house.  He is an Architectural technologist.  He showed me the outdoor kitchen that he made.  It was quite incredible.  He has had no problems with bleeding.  There is no bruising.  He has had no nausea or vomiting.  He has had no change in bowel or bladder habits.  There is been no rashes.  He has had no leg swelling.  Overall, his performance status is ECOG 0.     Medications:  Allergies as of 01/12/2019   No Known Allergies     Medication List       Accurate as of January 12, 2019  9:33 AM. If you have any questions, ask your nurse or doctor.        acetaminophen 500 MG tablet Commonly known as: TYLENOL Take 500 mg by mouth as needed for moderate pain.   albuterol 108 (90 Base) MCG/ACT inhaler Commonly known as: VENTOLIN HFA Inhale 2 puffs into the lungs every 6 (six) hours as needed for wheezing or shortness of breath.   aspirin 81 MG chewable tablet Chew 81 mg by mouth daily.   busPIRone 15 MG tablet Commonly known as: BUSPAR TAKE 1 TABLET BY MOUTH THREE TIMES DAILY FOR ANXIETY   clopidogrel 75 MG tablet Commonly known as: PLAVIX Take one tablet by mouth once daily   CONTOUR NEXT EZ MONITOR w/Device Kit Test blood sugar three times daily E11.22   glucose blood test strip Commonly known as: Visual merchandiser Next Test Use as instructed   Insulin Pen Needle 32G X 4 MM Misc Use as Directed. Dx: E11.40   lisinopril 5 MG tablet Commonly known as:  ZESTRIL TAKE 1 TABLET(5 MG) BY MOUTH DAILY   meloxicam 15 MG tablet Commonly known as: MOBIC Take 1 tablet (15 mg total) by mouth daily.   metFORMIN 1000 MG tablet Commonly known as: GLUCOPHAGE TAKE 1 TABLET(1000 MG) BY MOUTH TWICE DAILY   metoprolol tartrate 25 MG tablet Commonly known as: LOPRESSOR Take 0.5 tablets (12.5 mg total) by mouth 2 (two) times daily.   pantoprazole 40 MG tablet Commonly known as: PROTONIX Take 1 tablet (40 mg total) by mouth daily.   rosuvastatin 40 MG tablet Commonly known as: Crestor Take 1 tablet (40 mg total) by mouth daily.   sertraline 100 MG tablet Commonly known as: ZOLOFT Take 2 tablets (200 mg total) by mouth daily.   thiamine 100 MG tablet Commonly known as: VITAMIN B-1 Take 100 mg by mouth daily.   Toujeo SoloStar 300 UNIT/ML Sopn Generic drug: Insulin Glargine (1 Unit Dial) Inject 60 Units into the skin daily.       Allergies: No Known Allergies  Past Medical History, Surgical history, Social history, and Family History were reviewed and updated.  Review of Systems: Review of Systems  Constitutional: Negative.   HENT: Negative.   Eyes: Negative.   Respiratory: Negative.   Cardiovascular: Negative.  Gastrointestinal: Negative.   Genitourinary: Negative.   Musculoskeletal: Negative.   Skin: Negative.   Neurological: Negative.   Endo/Heme/Allergies: Negative.   Psychiatric/Behavioral: Negative.      Physical Exam:  weight is 267 lb (121.1 kg). His temporal temperature is 98.6 F (37 C). His blood pressure is 126/62 and his pulse is 54 (abnormal). His respiration is 18 and oxygen saturation is 94%.   Wt Readings from Last 3 Encounters:  01/12/19 267 lb (121.1 kg)  12/25/18 268 lb (121.6 kg)  11/27/18 270 lb (122.5 kg)    Physical Exam Vitals reviewed.  HENT:     Head: Normocephalic and atraumatic.  Eyes:     Pupils: Pupils are equal, round, and reactive to light.  Cardiovascular:     Rate and Rhythm:  Normal rate and regular rhythm.     Heart sounds: Normal heart sounds.  Pulmonary:     Effort: Pulmonary effort is normal.     Breath sounds: Normal breath sounds.  Abdominal:     General: Bowel sounds are normal.     Palpations: Abdomen is soft.  Musculoskeletal:        General: No tenderness or deformity. Normal range of motion.     Cervical back: Normal range of motion.  Lymphadenopathy:     Cervical: No cervical adenopathy.  Skin:    General: Skin is warm and dry.     Findings: No erythema or rash.  Neurological:     Mental Status: He is alert and oriented to person, place, and time.  Psychiatric:        Behavior: Behavior normal.        Thought Content: Thought content normal.        Judgment: Judgment normal.      Lab Results  Component Value Date   WBC 5.9 01/12/2019   HGB 14.0 01/12/2019   HCT 43.0 01/12/2019   MCV 95.3 01/12/2019   PLT 75 (L) 01/12/2019   Lab Results  Component Value Date   FERRITIN 11 (L) 07/14/2018   IRON 64 07/14/2018   TIBC 458 (H) 07/14/2018   UIBC 393 (H) 07/14/2018   IRONPCTSAT 14 (L) 07/14/2018   Lab Results  Component Value Date   RETICCTPCT 1.1 07/14/2018   RBC 4.51 01/12/2019   No results found for: [facility] Lab Results  Component Value Date   IGGSERUM 1,430 03/18/2017   IGMSERUM 93 03/18/2017   Lab Results  Component Value Date   ALBUMINELP 4.0 03/18/2017   A1GS 0.3 03/18/2017   A2GS 0.7 03/18/2017   BETS 0.6 03/18/2017   BETA2SER 0.4 03/18/2017   GAMS 1.3 03/18/2017   SPEI  03/18/2017     Comment:     . A poorly-defined band of restricted protein mobility is detected in the gamma globulins. It is unlikely that this may represent a monoclonal protein; however, immunofixation analysis is available if clinically indicated. .      Chemistry      Component Value Date/Time   NA 139 01/12/2019 0850   NA 139 04/14/2017 0824   K 4.5 01/12/2019 0850   CL 101 01/12/2019 0850   CO2 31  01/12/2019 0850   BUN 17 01/12/2019 0850   BUN 19 04/14/2017 0824   CREATININE 0.99 01/12/2019 0850   CREATININE 0.98 12/20/2018 0813      Component Value Date/Time   CALCIUM 9.6 01/12/2019 0850   ALKPHOS 81 01/12/2019 0850   AST 50 (H) 01/12/2019 0850   ALT  57 (H) 01/12/2019 0850   BILITOT 0.6 01/12/2019 0850       Impression and Plan: Mr. Michelin is a pleasant 63 yo caucasian gentleman with iron deficiency anemia and thrombocytopenia with mild splenomegaly.   I suspect that he probably is developing hepatic steatosis.  He probably has a little bit of splenomegaly from this.  I think that overall his platelet count is holding pretty steady.  We will plan to get him back in 4 months.  I want to try to get him through the winter.  It is a lot of fun looking at his pictures of his handiwork.  He is very talented.    Volanda Napoleon, MD 12/18/20209:33 AM

## 2019-02-05 ENCOUNTER — Other Ambulatory Visit: Payer: Self-pay

## 2019-02-05 ENCOUNTER — Other Ambulatory Visit: Payer: Medicaid Other

## 2019-02-05 DIAGNOSIS — R748 Abnormal levels of other serum enzymes: Secondary | ICD-10-CM

## 2019-02-06 ENCOUNTER — Other Ambulatory Visit: Payer: Medicaid Other

## 2019-02-06 ENCOUNTER — Other Ambulatory Visit: Payer: Self-pay

## 2019-02-06 DIAGNOSIS — R748 Abnormal levels of other serum enzymes: Secondary | ICD-10-CM

## 2019-02-06 LAB — COMPLETE METABOLIC PANEL WITH GFR
AG Ratio: 1.3 (calc) (ref 1.0–2.5)
ALT: 45 U/L (ref 9–46)
AST: 43 U/L — ABNORMAL HIGH (ref 10–35)
Albumin: 4.3 g/dL (ref 3.6–5.1)
Alkaline phosphatase (APISO): 80 U/L (ref 35–144)
BUN: 17 mg/dL (ref 7–25)
CO2: 28 mmol/L (ref 20–32)
Calcium: 9.6 mg/dL (ref 8.6–10.3)
Chloride: 102 mmol/L (ref 98–110)
Creat: 0.94 mg/dL (ref 0.70–1.25)
GFR, Est African American: 101 mL/min/{1.73_m2} (ref >=60)
GFR, Est Non African American: 87 mL/min/{1.73_m2} (ref >=60)
Globulin: 3.3 g/dL (calc) (ref 1.9–3.7)
Glucose, Bld: 157 mg/dL — ABNORMAL HIGH (ref 65–99)
Potassium: 4.5 mmol/L (ref 3.5–5.3)
Sodium: 139 mmol/L (ref 135–146)
Total Bilirubin: 0.5 mg/dL (ref 0.2–1.2)
Total Protein: 7.6 g/dL (ref 6.1–8.1)

## 2019-03-19 ENCOUNTER — Other Ambulatory Visit: Payer: Self-pay | Admitting: Nurse Practitioner

## 2019-03-19 DIAGNOSIS — K219 Gastro-esophageal reflux disease without esophagitis: Secondary | ICD-10-CM

## 2019-03-19 DIAGNOSIS — F329 Major depressive disorder, single episode, unspecified: Secondary | ICD-10-CM

## 2019-03-19 DIAGNOSIS — F419 Anxiety disorder, unspecified: Secondary | ICD-10-CM

## 2019-03-19 DIAGNOSIS — F32A Depression, unspecified: Secondary | ICD-10-CM

## 2019-03-26 ENCOUNTER — Other Ambulatory Visit: Payer: Self-pay

## 2019-03-27 LAB — COMPLETE METABOLIC PANEL WITH GFR
AG Ratio: 1.2 (calc) (ref 1.0–2.5)
ALT: 63 U/L — ABNORMAL HIGH (ref 9–46)
AST: 66 U/L — ABNORMAL HIGH (ref 10–35)
Albumin: 4.4 g/dL (ref 3.6–5.1)
Alkaline phosphatase (APISO): 101 U/L (ref 35–144)
BUN: 16 mg/dL (ref 7–25)
CO2: 28 mmol/L (ref 20–32)
Calcium: 9.8 mg/dL (ref 8.6–10.3)
Chloride: 101 mmol/L (ref 98–110)
Creat: 0.93 mg/dL (ref 0.70–1.25)
GFR, Est African American: 102 mL/min/{1.73_m2} (ref >=60)
GFR, Est Non African American: 88 mL/min/{1.73_m2} (ref >=60)
Globulin: 3.6 g/dL (calc) (ref 1.9–3.7)
Glucose, Bld: 106 mg/dL — ABNORMAL HIGH (ref 65–99)
Potassium: 4.4 mmol/L (ref 3.5–5.3)
Sodium: 137 mmol/L (ref 135–146)
Total Bilirubin: 0.8 mg/dL (ref 0.2–1.2)
Total Protein: 8 g/dL (ref 6.1–8.1)

## 2019-03-27 LAB — LIPID PANEL
Cholesterol: 99 mg/dL (ref <200)
HDL: 34 mg/dL — ABNORMAL LOW (ref >=40)
LDL Cholesterol (Calc): 48 mg/dL (calc)
Non-HDL Cholesterol (Calc): 65 mg/dL (calc) (ref <130)
Total CHOL/HDL Ratio: 2.9 (calc) (ref <5.0)
Triglycerides: 89 mg/dL (ref <150)

## 2019-03-27 LAB — HEMOGLOBIN A1C
Hgb A1c MFr Bld: 8.2 % of total Hgb — ABNORMAL HIGH (ref <5.7)
Mean Plasma Glucose: 189 (calc)
eAG (mmol/L): 10.4 (calc)

## 2019-03-30 ENCOUNTER — Ambulatory Visit (INDEPENDENT_AMBULATORY_CARE_PROVIDER_SITE_OTHER): Payer: Medicaid Other | Admitting: Nurse Practitioner

## 2019-03-30 ENCOUNTER — Other Ambulatory Visit: Payer: Self-pay

## 2019-03-30 ENCOUNTER — Encounter: Payer: Self-pay | Admitting: Nurse Practitioner

## 2019-03-30 VITALS — BP 132/78 | HR 60 | Temp 97.3°F | Ht 66.0 in | Wt 267.8 lb

## 2019-03-30 DIAGNOSIS — G894 Chronic pain syndrome: Secondary | ICD-10-CM

## 2019-03-30 DIAGNOSIS — Z794 Long term (current) use of insulin: Secondary | ICD-10-CM

## 2019-03-30 DIAGNOSIS — F329 Major depressive disorder, single episode, unspecified: Secondary | ICD-10-CM

## 2019-03-30 DIAGNOSIS — E1140 Type 2 diabetes mellitus with diabetic neuropathy, unspecified: Secondary | ICD-10-CM

## 2019-03-30 DIAGNOSIS — F419 Anxiety disorder, unspecified: Secondary | ICD-10-CM

## 2019-03-30 DIAGNOSIS — D696 Thrombocytopenia, unspecified: Secondary | ICD-10-CM

## 2019-03-30 DIAGNOSIS — R748 Abnormal levels of other serum enzymes: Secondary | ICD-10-CM

## 2019-03-30 DIAGNOSIS — F32A Depression, unspecified: Secondary | ICD-10-CM

## 2019-03-30 DIAGNOSIS — E785 Hyperlipidemia, unspecified: Secondary | ICD-10-CM

## 2019-03-30 DIAGNOSIS — G4733 Obstructive sleep apnea (adult) (pediatric): Secondary | ICD-10-CM

## 2019-03-30 DIAGNOSIS — E1169 Type 2 diabetes mellitus with other specified complication: Secondary | ICD-10-CM

## 2019-03-30 DIAGNOSIS — I25118 Atherosclerotic heart disease of native coronary artery with other forms of angina pectoris: Secondary | ICD-10-CM

## 2019-03-30 MED ORDER — TOUJEO SOLOSTAR 300 UNIT/ML ~~LOC~~ SOPN: 68.0000 [IU] | PEN_INJECTOR | Freq: Every day | SUBCUTANEOUS | 5 refills | Status: DC

## 2019-03-30 NOTE — Patient Instructions (Addendum)
Increase toujeo to 68 units daily  After a week if fasting blood sugar still over 120 to increase 2 units   Diabetes Mellitus and Nutrition, Adult When you have diabetes (diabetes mellitus), it is very important to have healthy eating habits because your blood sugar (glucose) levels are greatly affected by what you eat and drink. Eating healthy foods in the appropriate amounts, at about the same times every day, can help you:  Control your blood glucose.  Lower your risk of heart disease.  Improve your blood pressure.  Reach or maintain a healthy weight. Every person with diabetes is different, and each person has different needs for a meal plan. Your health care provider may recommend that you work with a diet and nutrition specialist (dietitian) to make a meal plan that is best for you. Your meal plan may vary depending on factors such as:  The calories you need.  The medicines you take.  Your weight.  Your blood glucose, blood pressure, and cholesterol levels.  Your activity level.  Other health conditions you have, such as heart or kidney disease. How do carbohydrates affect me? Carbohydrates, also called carbs, affect your blood glucose level more than any other type of food. Eating carbs naturally raises the amount of glucose in your blood. Carb counting is a method for keeping track of how many carbs you eat. Counting carbs is important to keep your blood glucose at a healthy level, especially if you use insulin or take certain oral diabetes medicines. It is important to know how many carbs you can safely have in each meal. This is different for every person. Your dietitian can help you calculate how many carbs you should have at each meal and for each snack. Foods that contain carbs include:  Bread, cereal, rice, pasta, and crackers.  Potatoes and corn.  Peas, beans, and lentils.  Milk and yogurt.  Fruit and juice.  Desserts, such as cakes, cookies, ice cream, and  candy. How does alcohol affect me? Alcohol can cause a sudden decrease in blood glucose (hypoglycemia), especially if you use insulin or take certain oral diabetes medicines. Hypoglycemia can be a life-threatening condition. Symptoms of hypoglycemia (sleepiness, dizziness, and confusion) are similar to symptoms of having too much alcohol. If your health care provider says that alcohol is safe for you, follow these guidelines:  Limit alcohol intake to no more than 1 drink per day for nonpregnant women and 2 drinks per day for men. One drink equals 12 oz of beer, 5 oz of wine, or 1 oz of hard liquor.  Do not drink on an empty stomach.  Keep yourself hydrated with water, diet soda, or unsweetened iced tea.  Keep in mind that regular soda, juice, and other mixers may contain a lot of sugar and must be counted as carbs. What are tips for following this plan?  Reading food labels  Start by checking the serving size on the "Nutrition Facts" label of packaged foods and drinks. The amount of calories, carbs, fats, and other nutrients listed on the label is based on one serving of the item. Many items contain more than one serving per package.  Check the total grams (g) of carbs in one serving. You can calculate the number of servings of carbs in one serving by dividing the total carbs by 15. For example, if a food has 30 g of total carbs, it would be equal to 2 servings of carbs.  Check the number of grams (g) of  saturated and trans fats in one serving. Choose foods that have low or no amount of these fats.  Check the number of milligrams (mg) of salt (sodium) in one serving. Most people should limit total sodium intake to less than 2,300 mg per day.  Always check the nutrition information of foods labeled as "low-fat" or "nonfat". These foods may be higher in added sugar or refined carbs and should be avoided.  Talk to your dietitian to identify your daily goals for nutrients listed on the  label. Shopping  Avoid buying canned, premade, or processed foods. These foods tend to be high in fat, sodium, and added sugar.  Shop around the outside edge of the grocery store. This includes fresh fruits and vegetables, bulk grains, fresh meats, and fresh dairy. Cooking  Use low-heat cooking methods, such as baking, instead of high-heat cooking methods like deep frying.  Cook using healthy oils, such as olive, canola, or sunflower oil.  Avoid cooking with butter, cream, or high-fat meats. Meal planning  Eat meals and snacks regularly, preferably at the same times every day. Avoid going long periods of time without eating.  Eat foods high in fiber, such as fresh fruits, vegetables, beans, and whole grains. Talk to your dietitian about how many servings of carbs you can eat at each meal.  Eat 4-6 ounces (oz) of lean protein each day, such as lean meat, chicken, fish, eggs, or tofu. One oz of lean protein is equal to: ? 1 oz of meat, chicken, or fish. ? 1 egg. ?  cup of tofu.  Eat some foods each day that contain healthy fats, such as avocado, nuts, seeds, and fish. Lifestyle  Check your blood glucose regularly.  Exercise regularly as told by your health care provider. This may include: ? 150 minutes of moderate-intensity or vigorous-intensity exercise each week. This could be brisk walking, biking, or water aerobics. ? Stretching and doing strength exercises, such as yoga or weightlifting, at least 2 times a week.  Take medicines as told by your health care provider.  Do not use any products that contain nicotine or tobacco, such as cigarettes and e-cigarettes. If you need help quitting, ask your health care provider.  Work with a Social worker or diabetes educator to identify strategies to manage stress and any emotional and social challenges. Questions to ask a health care provider  Do I need to meet with a diabetes educator?  Do I need to meet with a dietitian?  What  number can I call if I have questions?  When are the best times to check my blood glucose? Where to find more information:  American Diabetes Association: diabetes.org  Academy of Nutrition and Dietetics: www.eatright.[employer] of Diabetes and Digestive and Kidney Diseases (NIH): [employer] Summary  A healthy meal plan will help you control your blood glucose and maintain a healthy lifestyle.  Working with a diet and nutrition specialist (dietitian) can help you make a meal plan that is best for you.  Keep in mind that carbohydrates (carbs) and alcohol have immediate effects on your blood glucose levels. It is important to count carbs and to use alcohol carefully. This information is not intended to replace advice given to you by your health care provider. Make sure you discuss any questions you have with your health care provider. Document Revised: 12/24/2016 Document Reviewed: 02/16/2016 Elsevier Patient Education  2020 [employer].

## 2019-03-30 NOTE — Progress Notes (Signed)
Careteam: Patient Care Team: Barry Chandler, NP as PCP - General (Geriatric Medicine) Barry Harp, MD as Consulting Physician (Cardiology)  PLACE OF SERVICE:  [place] Directive information Does Patient Have a Medical Advance Directive?: No, Would patient like information on creating a medical advance directive?: Yes (ED - Information included in AVS)  No Known Allergies  Chief Complaint  Patient presents with  . Medical Management of Chronic Issues    3 month follow-up and discuss labs (copy printed). Patient states overall he does not feel well and questions if he had covid. Patient with HA's x 5 weeks.   . Medication Management    Review all medications to confirm necessay. Patient questions if he can come off some of them.      HPI: Patient is a 64 y.o. male for follow up.  No acute illness but has felt run down with headaches, usual aches and pain but every day is better. Had sinus issues.  Still has no insurance. Gets medication through a clinic or buys medications out of pocket.   DM- A1c better but not controlled, using toujeo 64 units daily. metformin 1000 mg twice daily  No hypoglycemia.  Diet "is terrible" he skips meals. Needs more information on what to eat  Urinary frequency at night  Not sleeping well at night.    OSA- using CPAP off and on. Has not used recently.   Depression and anxiety- on zoloft and buspar. Some days better than others. Lots of stress.   htn- continues on metoprolol and lisinopril.   Hyperlipidemia- LDL at goal on Crestor 40 mg daily  Elevated liver enzymes- fatty liver noted on [place].    Review of Systems:  Review of Systems  Constitutional: Negative for chills, fever and weight loss.  HENT: Positive for congestion (improving). Negative for tinnitus.   Respiratory: Negative for cough, sputum production and shortness of breath.   Cardiovascular: Negative for chest pain, palpitations and leg swelling.   Gastrointestinal: Negative for abdominal pain, constipation, diarrhea and heartburn.  Genitourinary: Positive for frequency (at bedtime, no recent change). Negative for dysuria and urgency.  Musculoskeletal: Positive for back pain and joint pain. Negative for falls and myalgias.  Skin: Negative.   Neurological: Positive for headaches (improving). Negative for dizziness.  Psychiatric/Behavioral: Negative for depression and memory loss. The patient does not have insomnia.        Increase stress   Past Medical History:  Diagnosis Date  . Coronary artery disease   . MI (myocardial infarction) ([place]) 04/23/2007   inferior wall  . Morbid obesity ([name]) 06/26/2012  . S/P CABG x 3 07/04/2012   LIMA to LAD, SVG to D1, SVG to PDA, EVH via right thigh  . Sleep apnea   . Type II or unspecified type diabetes mellitus without mention of complication, not stated as uncontrolled   . Unspecified essential hypertension    Past Surgical History:  Procedure Laterality Date  . CORONARY ANGIOPLASTY WITH STENT PLACEMENT  04/23/2007   PCI and stenting of mid RCA - Dr Donnetta Hutching @ [facility]  . CORONARY ARTERY BYPASS GRAFT N/A 07/04/2012   Procedure: CORONARY ARTERY BYPASS GRAFTING (CABG);  Surgeon: Rexene Alberts, MD;  Location: [place];  Service: Open Heart Surgery;  Laterality: N/A;  x3 using right greater saphenous vein and left internal mammary.   . DENTAL SURGERY  04/2018   4 teeth removed  . INTRAOPERATIVE TRANSESOPHAGEAL ECHOCARDIOGRAM N/A 07/04/2012   Procedure: INTRAOPERATIVE TRANSESOPHAGEAL  ECHOCARDIOGRAM;  Surgeon: Rexene Alberts, MD;  Location: [place];  Service: Open Heart Surgery;  Laterality: N/A;  . LEFT HEART CATH AND CORS/GRAFTS ANGIOGRAPHY N/A 04/18/2017   Procedure: LEFT HEART CATH AND CORS/GRAFTS ANGIOGRAPHY;  Surgeon: Barry Harp, MD;  Location: [place] [facility];  Service: Cardiovascular;  Laterality: N/A;  . LEFT HEART CATHETERIZATION WITH CORONARY ANGIOGRAM N/A 06/25/2012   Procedure: LEFT HEART  CATHETERIZATION WITH CORONARY ANGIOGRAM;  Surgeon: Barry Harp, MD;  Location: [facility];  Service: Cardiovascular;  Laterality: N/A;  . TOOTH EXTRACTION  04/2018   4 teeth pulled    Social History:   reports that he has never smoked. He has never used smokeless tobacco. He reports that he does not drink alcohol or use drugs.  Family History  Problem Relation Age of Onset  . Cancer Mother   . Diabetes Sister   . Diabetes Brother     Medications: Patient's Medications  New Prescriptions   No medications on file  Previous Medications   ACETAMINOPHEN (TYLENOL) 500 MG TABLET    Take 500 mg by mouth as needed for moderate pain.   ALBUTEROL (PROVENTIL HFA;VENTOLIN HFA) 108 (90 BASE) MCG/ACT INHALER    Inhale 2 puffs into the lungs every 6 (six) hours as needed for wheezing or shortness of breath.   ASPIRIN 81 MG CHEWABLE TABLET    Chew 81 mg by mouth daily.    BLOOD GLUCOSE MONITORING SUPPL (CONTOUR NEXT EZ MONITOR) W/DEVICE KIT    Test blood sugar three times daily E11.22   BUSPIRONE (BUSPAR) 15 MG TABLET    TAKE 1 TABLET BY MOUTH THREE TIMES DAILY FOR ANXIETY   CLOPIDOGREL (PLAVIX) 75 MG TABLET    Take one tablet by mouth once daily   GLUCOSE BLOOD (BAYER CONTOUR NEXT TEST) TEST STRIP    Use as instructed   INSULIN GLARGINE, 1 UNIT DIAL, (TOUJEO SOLOSTAR) 300 UNIT/ML SOPN    Inject 60 Units into the skin daily.   INSULIN PEN NEEDLE 32G X 4 MM MISC    Use as Directed. Dx: E11.40   LISINOPRIL (PRINIVIL,ZESTRIL) 5 MG TABLET    TAKE 1 TABLET(5 MG) BY MOUTH DAILY   MELOXICAM (MOBIC) 15 MG TABLET    Take 1 tablet (15 mg total) by mouth daily.   METFORMIN (GLUCOPHAGE) 1000 MG TABLET    TAKE 1 TABLET(1000 MG) BY MOUTH TWICE DAILY   METOPROLOL TARTRATE (LOPRESSOR) 25 MG TABLET    Take 0.5 tablets (12.5 mg total) by mouth 2 (two) times daily.   PANTOPRAZOLE (PROTONIX) 40 MG TABLET    Take 1 tablet (40 mg total) by mouth daily.   ROSUVASTATIN (CRESTOR) 40 MG TABLET    Take 1 tablet (40 mg  total) by mouth daily.   SERTRALINE (ZOLOFT) 100 MG TABLET    Take 2 tablets (200 mg total) by mouth daily.   THIAMINE (VITAMIN B-1) 100 MG TABLET    Take 100 mg by mouth daily.  Modified Medications   No medications on file  Discontinued Medications   No medications on file    Physical Exam:  Vitals:   03/30/19 1314  BP: 132/78  Pulse: 60  Temp: (!) 97.3 F (36.3 C)  TempSrc: Temporal  SpO2: 98%  Weight: 267 lb 12.8 oz (121.5 kg)  Height: _0  (1.676 m)   Body mass index is 43.22 kg/m. Wt Readings from Last 3 Encounters:  03/30/19 267 lb 12.8 oz (121.5 kg)  01/12/19 267 lb (  121.1 kg)  12/25/18 268 lb (121.6 kg)    Physical Exam Constitutional:      General: He is not in acute distress.    Appearance: He is well-developed. He is not diaphoretic.  HENT:     Head: Normocephalic and atraumatic.  Eyes:     Conjunctiva/sclera: Conjunctivae normal.     Pupils: Pupils are equal, round, and reactive to light.  Cardiovascular:     Rate and Rhythm: Normal rate and regular rhythm.     Heart sounds: Normal heart sounds.  Pulmonary:     Effort: Pulmonary effort is normal.     Breath sounds: Normal breath sounds.  Abdominal:     General: Abdomen is protuberant. Bowel sounds are normal.     Palpations: Abdomen is soft.  Musculoskeletal:        General: No tenderness.     Cervical back: Normal range of motion and neck supple.  Skin:    General: Skin is warm and dry.  Neurological:     Mental Status: He is alert and oriented to person, place, and time.     Labs reviewed: Basic Metabolic Panel: Recent Labs    01/12/19 0850 02/06/19 0905 03/26/19 0833  NA 139 139 137  K 4.5 4.5 4.4  CL 101 102 101  CO2 _0 GLUCOSE 169* 157* 106*  BUN _1 CREATININE 0.99 0.94 0.93  CALCIUM 9.6 9.6 9.8   Liver Function Tests: Recent Labs    07/14/18 0812 07/14/18 0812 10/12/18 0916 12/20/18 0813 01/12/19 0850 02/06/19 0905 03/26/19 0833  AST 44*   < > 42*    < > 50* 43* 66*  ALT 48*   < > 43   < > 57* 45 63*  ALKPHOS 95  --  87  --  81  --   --   BILITOT 0.6   < > 0.6   < > 0.6 0.5 0.8  PROT 7.2   < > 7.3   < > 7.8 7.6 8.0  ALBUMIN 3.9  --  4.0  --  4.3  --   --    < > = values in this interval not displayed.   No results for input(s): LIPASE, AMYLASE in the last 8760 hours. No results for input(s): AMMONIA in the last 8760 hours. CBC: Recent Labs    07/14/18 0812 10/12/18 0916 01/12/19 0850  WBC 5.0 5.4 5.9  NEUTROABS 3.2 3.3 4.0  HGB 12.2* 12.6* 14.0  HCT 37.5* 39.0 43.0  MCV 91.2 90.7 95.3  PLT 81* 85* 75*   Lipid Panel: Recent Labs    06/15/18 0822 03/26/19 0833  CHOL 116 99  HDL 35* 34*  LDLCALC 63 48  TRIG 92 89  CHOLHDL 3.3 2.9   TSH: No results for input(s): TSH in the last 8760 hours. A1C: Lab Results  Component Value Date   HGBA1C 8.2 (H) 03/26/2019     Assessment/Plan 1. Type 2 diabetes mellitus with diabetic neuropathy, with long-term current use of insulin (HCC) -a1c improved but not at goal. Diet is a big factor and does not eat consistently. Discussed in detail on importance of eating properly. Pt does not have insurance but agreeable to referral to nutritional services. Discussed looking online for resources from reputable web sites and also information provided in office.  Encouraged dietary compliance, routine foot care/monitoring and to keep up with diabetic eye exams through ophthalmology  -increase toujeo to 68 units daily at this time.  -  Referral to Nutrition and Diabetes Services - insulin glargine, 1 Unit Dial, (TOUJEO SOLOSTAR) 300 UNIT/ML Solostar Pen; Inject 68 Units into the skin daily.  Dispense: 20 pen; Refill: 5 - Hemoglobin A1c; Future  2. Hyperlipidemia associated with type 2 diabetes mellitus ([place]) LDL at goal on labs. Continues crestor 40 mg daily   3. Thrombocytopenia ([place]) Followed by oncology at this time.   4. Anxiety and depression -stable, encouraged to continue to try  to get outdoors, exercise, positive self talk with buspar and zoloft   5. Morbid obesity due to excess calories ([place]) Encouraged weight loss through proper eating habits and increase in physical activity.   6. Hyperlipidemia LDL goal <70 At goal on crestor.   7. Chronic pain syndrome Stable on mobic 15 mg daily.   8. Elevated liver enzymes Fatty liver noted on [place], again educated on dietary modifications.   9. Coronary artery disease of native artery of native heart with stable angina pectoris (HCC) Stable, continues on plavix and ASA  10. OSA Encouraged use of CPAP routinely to help with sleep quality.   Next appt: 4 months, will follow up A1c prior to appt.  Barry American. [place], [facility] [phone]

## 2019-04-09 ENCOUNTER — Other Ambulatory Visit: Payer: Self-pay | Admitting: *Deleted

## 2019-04-09 MED ORDER — CLOPIDOGREL BISULFATE 75 MG PO TABS: ORAL_TABLET | ORAL | 1 refills | Status: DC

## 2019-04-09 NOTE — Telephone Encounter (Signed)
Patient requested refill

## 2019-04-10 ENCOUNTER — Ambulatory Visit: Payer: Medicaid Other | Admitting: Registered"

## 2019-04-20 ENCOUNTER — Other Ambulatory Visit: Payer: Self-pay | Admitting: Nurse Practitioner

## 2019-04-20 DIAGNOSIS — K219 Gastro-esophageal reflux disease without esophagitis: Secondary | ICD-10-CM

## 2019-04-20 DIAGNOSIS — F329 Major depressive disorder, single episode, unspecified: Secondary | ICD-10-CM

## 2019-04-20 DIAGNOSIS — F419 Anxiety disorder, unspecified: Secondary | ICD-10-CM

## 2019-04-20 DIAGNOSIS — F32A Depression, unspecified: Secondary | ICD-10-CM

## 2019-04-20 NOTE — Telephone Encounter (Signed)
High risk or very high risk warning populated when attempting to refill medication. RX request sent to PCP for review and approval if warranted.   

## 2019-05-09 ENCOUNTER — Ambulatory Visit: Payer: Medicaid Other | Admitting: Registered"

## 2019-05-14 ENCOUNTER — Encounter: Payer: Self-pay | Admitting: Hematology & Oncology

## 2019-05-14 ENCOUNTER — Telehealth: Payer: Self-pay | Admitting: Hematology & Oncology

## 2019-05-14 ENCOUNTER — Inpatient Hospital Stay (HOSPITAL_BASED_OUTPATIENT_CLINIC_OR_DEPARTMENT_OTHER): Payer: Self-pay | Admitting: Hematology & Oncology

## 2019-05-14 ENCOUNTER — Other Ambulatory Visit: Payer: Self-pay

## 2019-05-14 ENCOUNTER — Inpatient Hospital Stay: Payer: Self-pay | Attending: Hematology & Oncology

## 2019-05-14 VITALS — BP 119/72 | HR 55 | Temp 97.1°F | Resp 19 | Wt 266.0 lb

## 2019-05-14 DIAGNOSIS — R161 Splenomegaly, not elsewhere classified: Secondary | ICD-10-CM | POA: Insufficient documentation

## 2019-05-14 DIAGNOSIS — E11641 Type 2 diabetes mellitus with hypoglycemia with coma: Secondary | ICD-10-CM

## 2019-05-14 DIAGNOSIS — D696 Thrombocytopenia, unspecified: Secondary | ICD-10-CM | POA: Insufficient documentation

## 2019-05-14 DIAGNOSIS — D509 Iron deficiency anemia, unspecified: Secondary | ICD-10-CM | POA: Insufficient documentation

## 2019-05-14 LAB — CMP (CANCER CENTER ONLY)
ALT: 49 U/L — ABNORMAL HIGH (ref 0–44)
AST: 50 U/L — ABNORMAL HIGH (ref 15–41)
Albumin: 3.9 g/dL (ref 3.5–5.0)
Alkaline Phosphatase: 83 U/L (ref 38–126)
Anion gap: 7 (ref 5–15)
BUN: 16 mg/dL (ref 8–23)
CO2: 27 mmol/L (ref 22–32)
Calcium: 9.3 mg/dL (ref 8.9–10.3)
Chloride: 106 mmol/L (ref 98–111)
Creatinine: 0.94 mg/dL (ref 0.61–1.24)
GFR, Est AFR Am: >60 mL/min (ref >60)
GFR, Estimated: >60 mL/min (ref >60)
Glucose, Bld: 118 mg/dL — ABNORMAL HIGH (ref 70–99)
Potassium: 4.3 mmol/L (ref 3.5–5.1)
Sodium: 140 mmol/L (ref 135–145)
Total Bilirubin: 0.6 mg/dL (ref 0.3–1.2)
Total Protein: 7.3 g/dL (ref 6.5–8.1)

## 2019-05-14 LAB — CBC WITH DIFFERENTIAL (CANCER CENTER ONLY)
Abs Immature Granulocytes: 0.01 10*3/uL (ref 0.00–0.07)
Basophils Absolute: 0.1 10*3/uL (ref 0.0–0.1)
Basophils Relative: 1 %
Eosinophils Absolute: 0.2 10*3/uL (ref 0.0–0.5)
Eosinophils Relative: 4 %
HCT: 37.8 % — ABNORMAL LOW (ref 39.0–52.0)
Hemoglobin: 12.3 g/dL — ABNORMAL LOW (ref 13.0–17.0)
Immature Granulocytes: 0 %
Lymphocytes Relative: 20 %
Lymphs Abs: 1.2 10*3/uL (ref 0.7–4.0)
MCH: 30.8 pg (ref 26.0–34.0)
MCHC: 32.5 g/dL (ref 30.0–36.0)
MCV: 94.7 fL (ref 80.0–100.0)
Monocytes Absolute: 0.5 10*3/uL (ref 0.1–1.0)
Monocytes Relative: 9 %
Neutro Abs: 4 10*3/uL (ref 1.7–7.7)
Neutrophils Relative %: 66 %
Platelet Count: 82 10*3/uL — ABNORMAL LOW (ref 150–400)
RBC: 3.99 MIL/uL — ABNORMAL LOW (ref 4.22–5.81)
RDW: 14.7 % (ref 11.5–15.5)
WBC Count: 5.9 10*3/uL (ref 4.0–10.5)
nRBC: 0 % (ref 0.0–0.2)

## 2019-05-14 LAB — PLATELET BY CITRATE

## 2019-05-14 LAB — FERRITIN: Ferritin: 78 ng/mL (ref 24–336)

## 2019-05-14 LAB — IRON AND TIBC
Iron: 56 ug/dL (ref 42–163)
Saturation Ratios: 16 % — ABNORMAL LOW (ref 20–55)
TIBC: 353 ug/dL (ref 202–409)
UIBC: 297 ug/dL (ref 117–376)

## 2019-05-14 LAB — SAVE SMEAR(SSMR), FOR PROVIDER SLIDE REVIEW

## 2019-05-14 NOTE — Progress Notes (Signed)
Hematology and Oncology Follow [name] Visit  [name] [id number] 1955/10/24 64 y.o. 05/14/2019   Principle Diagnosis:  Thrombocytopenia with mild splenomegaly  Iron deficiency anemia   Current Therapy:   Observations  IV iron as indicated   Interim History:  [name] is here today for follow-[name].  Overall, he is having problems.  He is still having some issues with weakness.  Seems to be over on the right side.  I am not sure exactly what could be going on with this.  There is been no bleeding.  He has had no bruising.  He just does not feel all that well.  I think a lot of the problems might be his diabetes.  His weight certainly is way too high for him.  He says he really cannot do all that much.  He gets tired very quickly.  He gets short of breath.  Again, I think his weight is way too much for him.  He has had no fever.  He has had his coronavirus vaccines.  He has had no diarrhea.  There is been no obvious change in bowel or bladder habits.  He has had no leg swelling.  There is been no rashes.  Occasional headaches.  Overall, his performance status is ECOG 0.     Medications:  Allergies as of 05/14/2019   No Known Allergies     Medication List       Accurate as of May 14, 2019  8:42 AM. If you have any questions, ask your nurse or doctor.        acetaminophen 500 MG tablet Commonly known as: TYLENOL Take 500 mg by mouth as needed for moderate pain.   albuterol 108 (90 Base) MCG/ACT inhaler Commonly known as: VENTOLIN HFA Inhale 2 puffs into the lungs every 6 (six) hours as needed for wheezing or shortness of breath.   aspirin 81 MG chewable tablet Chew 81 mg by mouth daily.   busPIRone 15 MG tablet Commonly known as: BUSPAR TAKE 1 TABLET BY MOUTH THREE TIMES DAILY FOR ANXIETY   clopidogrel 75 MG tablet Commonly known as: PLAVIX Take one tablet by mouth once daily   CONTOUR NEXT EZ MONITOR w/Device Kit Test blood sugar three times daily E11.22     glucose blood test strip Commonly known as: Visual merchandiser Next Test Use as instructed   Insulin Pen Needle 32G X 4 MM Misc Use as Directed. Dx: E11.40   lisinopril 5 MG tablet Commonly known as: ZESTRIL TAKE 1 TABLET(5 MG) BY MOUTH DAILY   meloxicam 15 MG tablet Commonly known as: MOBIC Take 1 tablet (15 mg total) by mouth daily.   metFORMIN 1000 MG tablet Commonly known as: GLUCOPHAGE TAKE 1 TABLET(1000 MG) BY MOUTH TWICE DAILY   metoprolol tartrate 25 MG tablet Commonly known as: LOPRESSOR Take 0.5 tablets (12.5 mg total) by mouth 2 (two) times daily.   pantoprazole 40 MG tablet Commonly known as: PROTONIX TAKE 1 TABLET(40 MG) BY MOUTH DAILY   rosuvastatin 40 MG tablet Commonly known as: Crestor Take 1 tablet (40 mg total) by mouth daily.   sertraline 100 MG tablet Commonly known as: ZOLOFT TAKE 2 TABLETS(200 MG) BY MOUTH DAILY   thiamine 100 MG tablet Commonly known as: Vitamin B-1 Take 100 mg by mouth daily.   Toujeo SoloStar 300 UNIT/ML Solostar Pen Generic drug: insulin glargine (1 Unit Dial) Inject 68 Units into the skin daily.       Allergies: No Known Allergies  Past Medical History, Surgical history, Social history, and Family History were reviewed and updated.  Review of Systems: Review of Systems  Constitutional: Negative.   HENT: Negative.   Eyes: Negative.   Respiratory: Negative.   Cardiovascular: Negative.   Gastrointestinal: Negative.   Genitourinary: Negative.   Musculoskeletal: Negative.   Skin: Negative.   Neurological: Negative.   Endo/Heme/Allergies: Negative.   Psychiatric/Behavioral: Negative.      Physical Exam:  vitals were not taken for this visit.   Wt Readings from Last 3 Encounters:  03/30/19 267 lb 12.8 oz (121.5 kg)  01/12/19 267 lb (121.1 kg)  12/25/18 268 lb (121.6 kg)    Physical Exam Vitals reviewed.  HENT:     Head: Normocephalic and atraumatic.  Eyes:     Pupils: Pupils are equal, round, and  reactive to light.  Cardiovascular:     Rate and Rhythm: Normal rate and regular rhythm.     Heart sounds: Normal heart sounds.  Pulmonary:     Effort: Pulmonary effort is normal.     Breath sounds: Normal breath sounds.  Abdominal:     General: Bowel sounds are normal.     Palpations: Abdomen is soft.  Musculoskeletal:        General: No tenderness or deformity. Normal range of motion.     Cervical back: Normal range of motion.  Lymphadenopathy:     Cervical: No cervical adenopathy.  Skin:    General: Skin is warm and dry.     Findings: No erythema or rash.  Neurological:     Mental Status: He is alert and oriented to person, place, and time.  Psychiatric:        Behavior: Behavior normal.        Thought Content: Thought content normal.        Judgment: Judgment normal.      Lab Results  Component Value Date   WBC 5.9 05/14/2019   HGB 12.3 (L) 05/14/2019   HCT 37.8 (L) 05/14/2019   MCV 94.7 05/14/2019   PLT 82 (L) 05/14/2019   Lab Results  Component Value Date   FERRITIN 71 01/12/2019   IRON 78 01/12/2019   TIBC 378 01/12/2019   UIBC 300 01/12/2019   IRONPCTSAT 21 01/12/2019   Lab Results  Component Value Date   RETICCTPCT 1.1 07/14/2018   RBC 3.99 (L) 05/14/2019   No results found for: Nils Pyle [facility] Lab Results  Component Value Date   IGGSERUM 1,430 03/18/2017   IGMSERUM 93 03/18/2017   Lab Results  Component Value Date   ALBUMINELP 4.0 03/18/2017   A1GS 0.3 03/18/2017   A2GS 0.7 03/18/2017   BETS 0.6 03/18/2017   BETA2SER 0.4 03/18/2017   GAMS 1.3 03/18/2017   SPEI  03/18/2017     Comment:     . A poorly-defined band of restricted protein mobility is detected in the gamma globulins. It is unlikely that this may represent a monoclonal protein; however, immunofixation analysis is available if clinically indicated. .      Chemistry      Component Value Date/Time   NA 137 03/26/2019 0833   NA 139 04/14/2017 0824   K  4.4 03/26/2019 0833   CL 101 03/26/2019 0833   CO2 28 03/26/2019 0833   BUN 16 03/26/2019 0833   BUN 19 04/14/2017 0824   CREATININE 0.93 03/26/2019 0833      Component Value Date/Time   CALCIUM 9.8 03/26/2019 0833   ALKPHOS 81 01/12/2019 0850  AST 66 (H) 03/26/2019 0833   AST 50 (H) 01/12/2019 0850   ALT 63 (H) 03/26/2019 0833   ALT 57 (H) 01/12/2019 0850   BILITOT 0.8 03/26/2019 0833   BILITOT 0.6 01/12/2019 0850       Impression and Plan: Mr. Skillern is a pleasant 64 yo caucasian gentleman with iron deficiency anemia and thrombocytopenia with mild splenomegaly.   His platelet count has been holding quite steady.  Is been steady for about a year.  Again I had to suspect that he probably is developing hepatic steatosis from his diabetes.  I think if he lost weight, he would feel a lot better and have more energy.  We will plan to get back in another 4 months.  I do not see that we have to do any scans on him right now.    Volanda Napoleon, MD 4/19/20218:42 AM

## 2019-05-14 NOTE — Telephone Encounter (Signed)
Appointments scheduled calendar printed per 4/19 los 

## 2019-05-16 ENCOUNTER — Encounter: Payer: Medicaid Other | Attending: Nurse Practitioner | Admitting: Registered"

## 2019-05-16 ENCOUNTER — Other Ambulatory Visit: Payer: Self-pay

## 2019-05-16 ENCOUNTER — Encounter: Payer: Self-pay | Admitting: Registered"

## 2019-05-16 DIAGNOSIS — E1129 Type 2 diabetes mellitus with other diabetic kidney complication: Secondary | ICD-10-CM | POA: Insufficient documentation

## 2019-05-16 DIAGNOSIS — E1165 Type 2 diabetes mellitus with hyperglycemia: Secondary | ICD-10-CM | POA: Insufficient documentation

## 2019-05-16 DIAGNOSIS — IMO0002 Reserved for concepts with insufficient information to code with codable children: Secondary | ICD-10-CM

## 2019-05-16 NOTE — Patient Instructions (Addendum)
[facility] [phone] Using snack sheet idea for grocery shopping to have balanced snacks on hand. Review the yellow Carb Counting Card to become familiar with carbs. Continue checking fasting blood sugar and start checking ~2 hrs after some meals. Bring glucose log book to your next appointment.

## 2019-05-16 NOTE — Progress Notes (Signed)
Diabetes Self-Management Education  Visit Type: First/Initial  Appt. Start Time: 1410 Appt. End Time: 4650  05/16/2019  Barry [name], identified by name and date of birth, is a 64 y.o. male with a diagnosis of Diabetes: Type 2.   ASSESSMENT  There were no vitals taken for this visit. There is no height or weight on file to calculate BMI.  Pt states he was diagnosed in 2014 when he went to the hospital with a heart attack. Pt states his first stent was placed 20 yrs ago. Pt states he has had some right-sided weakness that his MD will be investigating.  Pt reports for last 3 months has had sinus drainage, dry mouth, headaches, very low energy. Pt reports getting up during the night to get a drink of water due to dry mouth and usually will get a snack, not due to hunger, may be stress related.  Patient has appointments coming up to see an endocrinologist and physical therapist.  Blood sugar: (Pt reported) Fasting 119-200 mg/dL  Pt reports he loves being outside, loves to cook, enjoys taking care of toy poodles. Pt reports he has been hard being on disability x1 year and not able to do much. Pt has had a hard time adjusting to being physically limited. Pt states he moved back in with his ex-wife and soon after she had a stroke and he had been her caretaker for 17 months with not much time for self-care. Patient not sure what he should be eating.  RD spent most of education today on types of insulin action and instruction so that patient will be prepared when meeting with an endocrinologist for the first time. Patient states he was not consistent in the time when he would inject in the morning due getting busy and forgetting at first some mornings, was not leaving in skin for 5 seconds, was not rotating sites very much.   RD plans to go into more detail regarding meals and ideas for easy meals to plan/prepare due to time constraints with caregiver responsibilities. RD to educate on how to  evaluate nutritional supplements such as Ensure.  Relevant PMH: OSA, NSTEMI, CAD, essential HTN, T2DM with renal manifestations, neuropathy, hyperlipidemia, knee osteoarthritis, hypertriglyceridemia, s/p CABG x3, iron deficiency anemia  Diabetes Self-Management Education - 05/16/19 1420      Visit Information   Visit Type  First/Initial      Initial Visit   Diabetes Type  Type 2    Are you currently following a meal plan?  No    Are you taking your medications as prescribed?  Yes   Metformin 1000 bid, Toujeo 68 units am   Date Diagnosed  2015      Health Coping   How would you rate your overall health?  Poor      Psychosocial Assessment   Patient Belief/Attitude about Diabetes  Defeat/Burnout    How often do you need to have someone help you when you read instructions, pamphlets, or other written materials from your doctor or pharmacy?  1 - Never    What is the last grade level you completed in school?  12      Complications   Last HgB A1C per patient/outside source  8.2 %    How often do you check your blood sugar?  1-2 times/day    Fasting Blood glucose range (mg/dL)  70-129;>200   119-200   Have you had a dilated eye exam in the past 12 months?  Yes  Have you had a dental exam in the past 12 months?  No    Are you checking your feet?  No      Dietary Intake   Breakfast  coffee with powdered creamer 2 sweet & low, yogurt    Lunch  spaghetti, garlic bread    Dinner  beef roast    Snack (evening)  banana, caramel, cookie    Beverage(s)  water, diet drinks      Exercise   Exercise Type  ADL's    How many days per week to you exercise?  0    How many minutes per day do you exercise?  0    Total minutes per week of exercise  0      Patient Education   Previous Diabetes Education  No    Disease state   Definition of diabetes, type 1 and 2, and the diagnosis of diabetes    Nutrition management   Role of diet in the treatment of diabetes and the relationship between the  three main macronutrients and blood glucose level    Medications  Taught/reviewed insulin injection, site rotation, insulin storage and needle disposal.;Reviewed patients medication for diabetes, action, purpose, timing of dose and side effects.      Individualized Goals (developed by patient)   Nutrition  General guidelines for healthy choices and portions discussed    Medications  take my medication as prescribed    Monitoring   test my blood glucose as discussed    Health Coping  ask for help with (comment)   caregiving responsibilities     Outcomes   Expected Outcomes  Demonstrated interest in learning. Expect positive outcomes    Future DMSE  4-6 wks    Program Status  Not Completed       Individualized Plan for Diabetes Self-Management Training:   Learning Objective:  Patient will have a greater understanding of diabetes self-management. Patient education plan is to attend individual and/or group sessions per assessed needs and concerns.   Patient Instructions  [facility] [phone] Using snack sheet idea for grocery shopping to have balanced snacks on hand. Review the yellow Carb Counting Card to become familiar with carbs. Continue checking fasting blood sugar and start checking ~2 hrs after some meals. Bring glucose log book to your next appointment.   Expected Outcomes:  Demonstrated interest in learning. Expect positive outcomes  Education material provided: ADA - How to Thrive: A Guide for Your Journey with Diabetes and Carbohydrate counting sheet, Diabetes medications: Types of Insulin  If problems or questions, patient to contact team via:  Phone  Future DSME appointment: 4-6 wks

## 2019-06-29 ENCOUNTER — Encounter: Payer: Self-pay | Attending: Nurse Practitioner | Admitting: Registered"

## 2019-06-29 ENCOUNTER — Other Ambulatory Visit: Payer: Self-pay

## 2019-06-29 DIAGNOSIS — E1129 Type 2 diabetes mellitus with other diabetic kidney complication: Secondary | ICD-10-CM | POA: Insufficient documentation

## 2019-06-29 DIAGNOSIS — E1165 Type 2 diabetes mellitus with hyperglycemia: Secondary | ICD-10-CM | POA: Insufficient documentation

## 2019-06-29 DIAGNOSIS — IMO0002 Reserved for concepts with insufficient information to code with codable children: Secondary | ICD-10-CM

## 2019-06-29 NOTE — Progress Notes (Signed)
Diabetes Self-Management Education  Visit Type: Follow-up  Appt. Start Time: 1100 Appt. End Time: 1130  07/02/2019  [name], identified by name and date of birth, is a 64 y.o. male with a diagnosis of Diabetes: Type 2.   ASSESSMENT  There were no vitals taken for this visit. There is no height or weight on file to calculate BMI.   Patient states he continues with Toujeo at 68 u in morning, but has missed few days, states because he has been getting so little sleep and in a lot of pain it has got him off track. Pt reports some days doesn't eat all day. Pt states he has increased fruit intake which helps with his sugar cravings and has increased water intake.   Pt states although he loves to walk and gets outside in yard, pain and fatigue is making it difficult.   Pt reports he now has an aid to help with caregiving, but this only gives him more time to take of home chores and still has a hard time spending time on self-care. Pt states even when he isn't doing direct caregiving he can't relax because worried about his ex-wife's safety.   Pt states he started PT 3x due to one-sided weakness which he states he was told it may be due to mini stroke.  Sample provided: Glucerna shake Lot #[id number] Exp: 1Aug2021  Patient had endocrinologist visit after RD visit. From MD notes in Epic: A1c 8.1% Start Ozempic Continue Toujeo Start gabapentin Check blood sugar 1x day  Diabetes Self-Management Education - 06/29/19 1118      Visit Information   Visit Type  Follow-up      Initial Visit   Diabetes Type  Type 2      Complications   Last HgB A1C per patient/outside source  8.1 %   06/29/19     Dietary Intake   Breakfast  yoplait peach yogurt, coffee OR ensure    Snack (morning)  none    Lunch  1/2 [place] sub, salad    Snack (afternoon)  nabs    Dinner  pasta salad    Snack (evening)  middle of night fruit    Beverage(s)  water,      Patient Education   Nutrition management    Meal options for control of blood glucose level and chronic complications.    Medications  Reviewed patients medication for diabetes, action, purpose, timing of dose and side effects.      Individualized Goals (developed by patient)   Nutrition  General guidelines for healthy choices and portions discussed    Reducing Risk  Other (comment)   sleep     Outcomes   Expected Outcomes  Demonstrated interest in learning. Expect positive outcomes    Future DMSE  4-6 wks    Program Status  Not Completed      Subsequent Visit   Since your last visit have you continued or begun to take your medications as prescribed?  No   occassionally misses insulin      Individualized Plan for Diabetes Self-Management Training:   Learning Objective:  Patient will have a greater understanding of diabetes self-management. Patient education plan is to attend individual and/or group sessions per assessed needs and concerns.    Patient Instructions  Look for recipes [email or web address] Consider signing up for an account so you can use for meal planning and grocery list Consider getting protein at breakfast When looking for a nutritional meal supplement look for lower  sugar, around 25 g protein is plenty Consider eating protein with fruit and careful to not have too much fruit Work on getting into a routine that will help you take your insulin every day.   Expected Outcomes:  Demonstrated interest in learning. Expect positive outcomes  Education material provided: none  If problems or questions, patient to contact team via:  Phone  Future DSME appointment: 4-6 wks

## 2019-06-29 NOTE — Patient Instructions (Addendum)
Look for recipes [email or web address] Consider signing up for an account so you can use for meal planning and grocery list Consider getting protein at breakfast When looking for a nutritional meal supplement look for lower sugar, around 25 g protein is plenty Consider eating protein with fruit and careful to not have too much fruit Work on getting into a routine that will help you take your insulin every day.

## 2019-07-27 ENCOUNTER — Other Ambulatory Visit: Payer: Self-pay

## 2019-07-31 ENCOUNTER — Other Ambulatory Visit: Payer: Medicaid Other

## 2019-07-31 ENCOUNTER — Other Ambulatory Visit: Payer: Self-pay

## 2019-08-01 ENCOUNTER — Ambulatory Visit: Payer: Self-pay | Admitting: Nurse Practitioner

## 2019-08-01 LAB — HEMOGLOBIN A1C
Hgb A1c MFr Bld: 7.6 % of total Hgb — ABNORMAL HIGH (ref <5.7)
Mean Plasma Glucose: 171 (calc)
eAG (mmol/L): 9.5 (calc)

## 2019-08-03 ENCOUNTER — Ambulatory Visit: Payer: Self-pay | Admitting: Nurse Practitioner

## 2019-08-08 ENCOUNTER — Ambulatory Visit: Payer: Medicaid Other | Admitting: Registered"

## 2019-08-13 ENCOUNTER — Encounter: Payer: Medicaid Other | Attending: Nurse Practitioner | Admitting: Registered"

## 2019-08-13 DIAGNOSIS — E1165 Type 2 diabetes mellitus with hyperglycemia: Secondary | ICD-10-CM | POA: Insufficient documentation

## 2019-08-13 DIAGNOSIS — E1129 Type 2 diabetes mellitus with other diabetic kidney complication: Secondary | ICD-10-CM | POA: Insufficient documentation

## 2019-08-20 ENCOUNTER — Ambulatory Visit: Payer: Self-pay | Admitting: Nurse Practitioner

## 2019-09-13 ENCOUNTER — Ambulatory Visit: Payer: Medicaid Other | Admitting: Hematology & Oncology

## 2019-09-13 ENCOUNTER — Other Ambulatory Visit: Payer: Medicaid Other

## 2019-09-17 ENCOUNTER — Inpatient Hospital Stay (HOSPITAL_BASED_OUTPATIENT_CLINIC_OR_DEPARTMENT_OTHER): Payer: Medicaid Other | Admitting: Hematology & Oncology

## 2019-09-17 ENCOUNTER — Inpatient Hospital Stay: Payer: Medicaid Other | Attending: Hematology & Oncology

## 2019-09-17 VITALS — BP 96/52 | HR 67 | Temp 98.7°F | Resp 19 | Wt 244.0 lb

## 2019-09-17 DIAGNOSIS — M5417 Radiculopathy, lumbosacral region: Secondary | ICD-10-CM

## 2019-09-17 DIAGNOSIS — R161 Splenomegaly, not elsewhere classified: Secondary | ICD-10-CM | POA: Insufficient documentation

## 2019-09-17 DIAGNOSIS — Z809 Family history of malignant neoplasm, unspecified: Secondary | ICD-10-CM

## 2019-09-17 DIAGNOSIS — D509 Iron deficiency anemia, unspecified: Secondary | ICD-10-CM | POA: Insufficient documentation

## 2019-09-17 DIAGNOSIS — E119 Type 2 diabetes mellitus without complications: Secondary | ICD-10-CM | POA: Insufficient documentation

## 2019-09-17 DIAGNOSIS — D696 Thrombocytopenia, unspecified: Secondary | ICD-10-CM | POA: Insufficient documentation

## 2019-09-17 LAB — SAVE SMEAR(SSMR), FOR PROVIDER SLIDE REVIEW

## 2019-09-17 LAB — CMP (CANCER CENTER ONLY)
ALT: 62 U/L — ABNORMAL HIGH (ref 0–44)
AST: 74 U/L — ABNORMAL HIGH (ref 15–41)
Albumin: 4.1 g/dL (ref 3.5–5.0)
Alkaline Phosphatase: 73 U/L (ref 38–126)
Anion gap: 8 (ref 5–15)
BUN: 13 mg/dL (ref 8–23)
CO2: 27 mmol/L (ref 22–32)
Calcium: 9.7 mg/dL (ref 8.9–10.3)
Chloride: 106 mmol/L (ref 98–111)
Creatinine: 0.89 mg/dL (ref 0.61–1.24)
GFR, Est AFR Am: >60 mL/min (ref >60)
GFR, Estimated: >60 mL/min (ref >60)
Glucose, Bld: 97 mg/dL (ref 70–99)
Potassium: 4.5 mmol/L (ref 3.5–5.1)
Sodium: 141 mmol/L (ref 135–145)
Total Bilirubin: 0.6 mg/dL (ref 0.3–1.2)
Total Protein: 7.4 g/dL (ref 6.5–8.1)

## 2019-09-17 LAB — CBC WITH DIFFERENTIAL (CANCER CENTER ONLY)
Abs Immature Granulocytes: 0.01 10*3/uL (ref 0.00–0.07)
Basophils Absolute: 0.1 10*3/uL (ref 0.0–0.1)
Basophils Relative: 1 %
Eosinophils Absolute: 0.2 10*3/uL (ref 0.0–0.5)
Eosinophils Relative: 3 %
HCT: 41.2 % (ref 39.0–52.0)
Hemoglobin: 13.7 g/dL (ref 13.0–17.0)
Immature Granulocytes: 0 %
Lymphocytes Relative: 24 %
Lymphs Abs: 1.5 10*3/uL (ref 0.7–4.0)
MCH: 30.7 pg (ref 26.0–34.0)
MCHC: 33.3 g/dL (ref 30.0–36.0)
MCV: 92.4 fL (ref 80.0–100.0)
Monocytes Absolute: 0.5 10*3/uL (ref 0.1–1.0)
Monocytes Relative: 8 %
Neutro Abs: 4.1 10*3/uL (ref 1.7–7.7)
Neutrophils Relative %: 64 %
Platelet Count: 84 10*3/uL — ABNORMAL LOW (ref 150–400)
RBC: 4.46 MIL/uL (ref 4.22–5.81)
RDW: 16 % — ABNORMAL HIGH (ref 11.5–15.5)
WBC Count: 6.3 10*3/uL (ref 4.0–10.5)
nRBC: 0 % (ref 0.0–0.2)

## 2019-09-17 LAB — PLATELET BY CITRATE

## 2019-09-17 LAB — LACTATE DEHYDROGENASE: LDH: 253 U/L — ABNORMAL HIGH (ref 98–192)

## 2019-09-17 NOTE — Progress Notes (Signed)
Hematology and Oncology Follow Up Visit  Barry [name] [id number] 21-Sep-1955 64 y.o. 09/17/2019   Principle Diagnosis:  Thrombocytopenia with mild splenomegaly  Iron deficiency anemia   Current Therapy:   Observations  IV iron as indicated   Interim History:  [name] is here today for follow-up.  Unfortunately, he is having a tough time right now.  He has a lot of lower back issues.  He has radiculopathy down the left leg.  He had MRI of the lumbar spine a few weeks ago.  He clearly has disc herniation at L3-L4 causing impingement of the L4 nerve root.  He also has severe spinal stenosis at the lower levels.  He is seen pain management.  He is seeing physical therapy.  I suspect he probably will need surgery at some point.  However, the surgeon appears to be concerned about his weight and his diabetes.  He has had no bleeding.  He has had no bruising.  He has had no change in bowel or bladder habits.  There is no constipation.  There is no incontinence.  He has had a couple times where he gets up in the morning and the left leg gives out on him.  There has been no fever.  He has had no cough or chest wall pain.  He is worried about cancer.  He is youngest of 8 siblings.  All of his siblings have had cancer.  I think his mother has had cancer.  I think he would be a good candidate for genetics referral.  He is agreeable.  Overall, his performance status is ECOG 1.  .     Medications:  Allergies as of 09/17/2019   No Known Allergies     Medication List       Accurate as of September 17, 2019 11:33 AM. If you have any questions, ask your nurse or doctor.        acetaminophen 500 MG tablet Commonly known as: TYLENOL Take 500 mg by mouth as needed for moderate pain.   albuterol 108 (90 Base) MCG/ACT inhaler Commonly known as: VENTOLIN HFA Inhale 2 puffs into the lungs every 6 (six) hours as needed for wheezing or shortness of breath.   aspirin 81 MG chewable tablet Chew 81 mg  by mouth daily.   atorvastatin 80 MG tablet Commonly known as: LIPITOR Take 80 mg by mouth at bedtime.   busPIRone 15 MG tablet Commonly known as: BUSPAR TAKE 1 TABLET BY MOUTH THREE TIMES DAILY FOR ANXIETY   clopidogrel 75 MG tablet Commonly known as: PLAVIX Take one tablet by mouth once daily   CONTOUR NEXT EZ MONITOR w/Device Kit Test blood sugar three times daily E11.22   cyclobenzaprine 10 MG tablet Commonly known as: FLEXERIL Take 10 mg by mouth 2 (two) times daily as needed.   gabapentin 300 MG capsule Commonly known as: NEURONTIN Take 300 mg by mouth.   glipiZIDE 5 MG tablet Commonly known as: GLUCOTROL Take 5 mg by mouth daily.   glucose blood test strip Commonly known as: Visual merchandiser Next Test Use as instructed   Insulin Pen Needle 32G X 4 MM Misc Use as Directed. Dx: E11.40   lisinopril 5 MG tablet Commonly known as: ZESTRIL TAKE 1 TABLET(5 MG) BY MOUTH DAILY   meloxicam 15 MG tablet Commonly known as: MOBIC Take 15 mg by mouth daily.   metFORMIN 1000 MG tablet Commonly known as: GLUCOPHAGE TAKE 1 TABLET(1000 MG) BY MOUTH TWICE DAILY  methylPREDNISolone 4 MG Tbpk tablet Commonly known as: MEDROL DOSEPAK Take 4 mg by mouth as directed.   metoprolol tartrate 25 MG tablet Commonly known as: LOPRESSOR Take 0.5 tablets (12.5 mg total) by mouth 2 (two) times daily.   pantoprazole 40 MG tablet Commonly known as: PROTONIX TAKE 1 TABLET(40 MG) BY MOUTH DAILY   rosuvastatin 40 MG tablet Commonly known as: Crestor Take 1 tablet (40 mg total) by mouth daily.   Semaglutide(0.25 or 0.5MG/DOS) 2 MG/1.5ML Sopn Inject into the skin once a week.   sertraline 100 MG tablet Commonly known as: ZOLOFT TAKE 2 TABLETS(200 MG) BY MOUTH DAILY   thiamine 100 MG tablet Commonly known as: Vitamin B-1 Take 100 mg by mouth daily.   Toujeo SoloStar 300 UNIT/ML Solostar Pen Generic drug: insulin glargine (1 Unit Dial) Inject 68 Units into the skin daily.        Allergies: No Known Allergies  Past Medical History, Surgical history, Social history, and Family History were reviewed and updated.  Review of Systems: Review of Systems  Constitutional: Negative.   HENT: Negative.   Eyes: Negative.   Respiratory: Negative.   Cardiovascular: Negative.   Gastrointestinal: Negative.   Genitourinary: Negative.   Musculoskeletal: Negative.   Skin: Negative.   Neurological: Negative.   Endo/Heme/Allergies: Negative.   Psychiatric/Behavioral: Negative.      Physical Exam:  weight is 244 lb (110.7 kg). His oral temperature is 98.7 F (37.1 C). His blood pressure is 96/52 (abnormal) and his pulse is 67. His respiration is 19 and oxygen saturation is 99%.   Wt Readings from Last 3 Encounters:  09/17/19 244 lb (110.7 kg)  05/14/19 266 lb (120.7 kg)  03/30/19 267 lb 12.8 oz (121.5 kg)    Physical Exam Vitals reviewed.  HENT:     Head: Normocephalic and atraumatic.  Eyes:     Pupils: Pupils are equal, round, and reactive to light.  Cardiovascular:     Rate and Rhythm: Normal rate and regular rhythm.     Heart sounds: Normal heart sounds.  Pulmonary:     Effort: Pulmonary effort is normal.     Breath sounds: Normal breath sounds.  Abdominal:     General: Bowel sounds are normal.     Palpations: Abdomen is soft.  Musculoskeletal:        General: No tenderness or deformity. Normal range of motion.     Cervical back: Normal range of motion.  Lymphadenopathy:     Cervical: No cervical adenopathy.  Skin:    General: Skin is warm and dry.     Findings: No erythema or rash.  Neurological:     Mental Status: He is alert and oriented to person, place, and time.  Psychiatric:        Behavior: Behavior normal.        Thought Content: Thought content normal.        Judgment: Judgment normal.      Lab Results  Component Value Date   WBC 6.3 09/17/2019   HGB 13.7 09/17/2019   HCT 41.2 09/17/2019   MCV 92.4 09/17/2019   PLT 84 (L)  09/17/2019   Lab Results  Component Value Date   FERRITIN 78 05/14/2019   IRON 56 05/14/2019   TIBC 353 05/14/2019   UIBC 297 05/14/2019   IRONPCTSAT 16 (L) 05/14/2019   Lab Results  Component Value Date   RETICCTPCT 1.1 07/14/2018   RBC 4.46 09/17/2019   No results found for: KPAFRELGTCHN, LAMBDASER, KAPLAMBRATIO  Lab Results  Component Value Date   IGGSERUM 1,430 03/18/2017   IGMSERUM 93 03/18/2017   Lab Results  Component Value Date   ALBUMINELP 4.0 03/18/2017   A1GS 0.3 03/18/2017   A2GS 0.7 03/18/2017   BETS 0.6 03/18/2017   BETA2SER 0.4 03/18/2017   GAMS 1.3 03/18/2017   SPEI  03/18/2017     Comment:     . A poorly-defined band of restricted protein mobility is detected in the gamma globulins. It is unlikely that this may represent a monoclonal protein; however, immunofixation analysis is available if clinically indicated. .      Chemistry      Component Value Date/Time   NA 141 09/17/2019 0957   NA 139 04/14/2017 0824   K 4.5 09/17/2019 0957   CL 106 09/17/2019 0957   CO2 27 09/17/2019 0957   BUN 13 09/17/2019 0957   BUN 19 04/14/2017 0824   CREATININE 0.89 09/17/2019 0957   CREATININE 0.93 03/26/2019 0833      Component Value Date/Time   CALCIUM 9.7 09/17/2019 0957   ALKPHOS 73 09/17/2019 0957   AST 74 (H) 09/17/2019 0957   ALT 62 (H) 09/17/2019 0957   BILITOT 0.6 09/17/2019 0957       Impression and Plan: Mr. Gust is a pleasant 64 yo caucasian gentleman with iron deficiency anemia and thrombocytopenia with mild splenomegaly.   His platelet count has been holding quite steady.  Platelet count has been steady for about a year.  If he does need back surgery, I do not see a problem with his platelets where they are right now.  I think that the platelet count will be adequate.  However, if spinal surgery feels the platelet count needs to be higher, we can always work on this.  I would really like to see him back in 6 weeks.  We will make the  referral to the genetics clinic.  I spent a good 30 minutes with Mr. Attia today.  He had the issue with the lumbar radiculopathy that we had to help him out with.     Volanda Napoleon, MD 8/23/202111:33 AM

## 2019-09-18 ENCOUNTER — Telehealth: Payer: Self-pay | Admitting: Genetic Counselor

## 2019-09-18 NOTE — Telephone Encounter (Signed)
Received a genetic counseling referral from Dr. Myna Hidalgo for fhx of cancer. Barry [name] has been scheduled to see Cari on 9/20 at 1pm. Letter mailed to the pt.

## 2019-09-30 ENCOUNTER — Other Ambulatory Visit: Payer: Self-pay | Admitting: Nurse Practitioner

## 2019-10-04 ENCOUNTER — Telehealth: Payer: Self-pay | Admitting: *Deleted

## 2019-10-04 NOTE — Telephone Encounter (Signed)
Received fax from [facility] requesting Prior Authorization for Plavix Initiated Prior Authorization through CoverMyMeds Key: [id number]  Went into determination. Awaiting response.

## 2019-10-15 ENCOUNTER — Inpatient Hospital Stay: Payer: Self-pay

## 2019-10-15 ENCOUNTER — Other Ambulatory Visit: Payer: Self-pay

## 2019-10-15 ENCOUNTER — Inpatient Hospital Stay: Payer: Self-pay | Attending: Hematology & Oncology | Admitting: Genetic Counselor

## 2019-10-15 ENCOUNTER — Encounter: Payer: Self-pay | Admitting: Genetic Counselor

## 2019-10-15 DIAGNOSIS — Z800 Family history of malignant neoplasm of digestive organs: Secondary | ICD-10-CM

## 2019-10-15 DIAGNOSIS — Z809 Family history of malignant neoplasm, unspecified: Secondary | ICD-10-CM

## 2019-10-15 HISTORY — DX: Family history of malignant neoplasm of digestive organs: Z80.0

## 2019-10-15 NOTE — Progress Notes (Signed)
REFERRING PROVIDER: Volanda Napoleon, MD [address] [place],  [place] [address]  PRIMARY PROVIDER:  [name], NP  PRIMARY REASON FOR VISIT:  1. Family history of colon cancer   2. Family history of cancer    HISTORY OF PRESENT ILLNESS:   Barry [name], a 64 y.o. male, was seen for a [place] cancer genetics consultation at the request of Dr. Marin Olp due to a family history of colon and unknown cancers.  Barry [name] presents to clinic today to discuss the possibility of a hereditary predisposition to cancer, to discuss genetic testing, and to further clarify his future cancer risks, as well as potential cancer risks for family members.   Barry [name] is a 64 y.o. male with [name] personal history of cancer.    RISK FACTORS:  Colonoscopy: [name]; fecal occult blood tests previously  Prostate cancer screening: none reported Any excessive radiation exposure in the past: [name]  Past Medical History:  Diagnosis Date  . Coronary artery disease   . Family history of cancer 10/15/2019  . Family history of colon cancer 10/15/2019  . MI (myocardial infarction) ([place]) 04/23/2007   inferior wall  . Morbid obesity ([place]) 06/26/2012  . S/P CABG x 3 07/04/2012   LIMA to LAD, SVG to D1, SVG to PDA, EVH via right thigh  . Sleep apnea   . Type II or unspecified type diabetes mellitus without mention of complication, not stated as uncontrolled   . Unspecified essential hypertension     Past Surgical History:  Procedure Laterality Date  . CORONARY ANGIOPLASTY WITH STENT PLACEMENT  04/23/2007   PCI and stenting of mid RCA - Dr Donnetta Hutching @ [facility]  . CORONARY ARTERY BYPASS GRAFT N/A 07/04/2012   Procedure: CORONARY ARTERY BYPASS GRAFTING (CABG);  Surgeon: Rexene Alberts, MD;  Location: [facility];  Service: Open Heart Surgery;  Laterality: N/A;  x3 using right greater saphenous vein and left internal mammary.   . DENTAL SURGERY  04/2018   4 teeth removed  . INTRAOPERATIVE TRANSESOPHAGEAL ECHOCARDIOGRAM N/A  07/04/2012   Procedure: INTRAOPERATIVE TRANSESOPHAGEAL ECHOCARDIOGRAM;  Surgeon: Rexene Alberts, MD;  Location: [facility];  Service: Open Heart Surgery;  Laterality: N/A;  . LEFT HEART CATH AND CORS/GRAFTS ANGIOGRAPHY N/A 04/18/2017   Procedure: LEFT HEART CATH AND CORS/GRAFTS ANGIOGRAPHY;  Surgeon: Lorretta Harp, MD;  Location: [facility];  Service: Cardiovascular;  Laterality: N/A;  . LEFT HEART CATHETERIZATION WITH CORONARY ANGIOGRAM N/A 06/25/2012   Procedure: LEFT HEART CATHETERIZATION WITH CORONARY ANGIOGRAM;  Surgeon: Lorretta Harp, MD;  Location: [facility] CATH LAB;  Service: Cardiovascular;  Laterality: N/A;  . TOOTH EXTRACTION  04/2018   4 teeth pulled     Social History   Socioeconomic History  . Marital status: Divorced    Spouse name: Not on file  . Number of children: 1  . Years of education: Not on file  . Highest education level: Not on file  Occupational History  . Occupation: MAINTENANCE    Employer: [employer]  Tobacco Use  . Smoking status: Never Smoker  . Smokeless tobacco: Never Used  Vaping Use  . Vaping Use: Never used  Substance and Sexual Activity  . Alcohol use: [name]    Alcohol/week: 0.0 standard drinks  . Drug use: [name]  . Sexual activity: Not on file  Other Topics Concern  . Not on file  Social History Narrative   Lives alone in a one story home.  Has  one daughter.  Works as a Air traffic controller.  Education: high school.    Social Determinants of Health   Financial Resource Strain:   . Difficulty of Paying Living Expenses: Not on file  Food Insecurity:   . Worried About Charity fundraiser in the Last Year: Not on file  . Ran Out of Food in the Last Year: Not on file  Transportation Needs:   . Lack of Transportation (Medical): Not on file  . Lack of Transportation (Non-Medical): Not on file  Physical Activity:   . Days of Exercise per Week: Not on file  . Minutes of Exercise per Session: Not on file  Stress:   . Feeling of Stress  : Not on file  Social Connections:   . Frequency of Communication with Friends and Family: Not on file  . Frequency of Social Gatherings with Friends and Family: Not on file  . Attends Religious Services: Not on file  . Active Member of Clubs or Organizations: Not on file  . Attends Archivist Meetings: Not on file  . Marital Status: Not on file     FAMILY HISTORY:  We obtained a detailed, 4-generation family history.  Significant diagnoses are listed below: Family History  Problem Relation Age of Onset  . Cancer Mother        unknown type; dx late 47s  . Cancer Father        unknown type; dx > 50yo  . Colon cancer Brother        dx late 30s  . Colon cancer Brother        dx late 60s; dx 76  . Cancer Sister        unknown type; dx late 71s  . Cancer Maternal Aunt        unknown type; dx > 91yo      Barry [name] has one daughter, age 55, without a history of cancer.  Barry [name] has two brothers with a history of colon cancer.  One brother, now 64, was diagnosed in his late 74s.  The other brother was reported to have two primary colon cancers--one diagnosed in his late 45s and the other diagnosed at 51.  One of his sisters passed away in her late 30s after a diagnosis of cancer.  Barry [name] has one brother, age 2, and one sister, age 87, without a history of cancer.  Two of his brothers passed away during childhood and did not have cancer.  Barry [name]'s mother had an unknown type of cancer diagnosed in her 16s.  Barry [name]'s maternal aunt had an unknown cancer diagnosed after the age of 91.  [name] was reported.  Barry [name]'s father was diagnosed with an unknown type of cancer after the age of 74.  Barry [name] has limited information about paternal family history but reported [name] known cancer in other paternal relatives.   Barry [name] is unaware of previous family history of genetic testing for hereditary cancer risks. Patient's maternal ancestors are of White/Caucasian  descent, and paternal ancestors are of White/Caucasian descent. There is [name] reported Ashkenazi Jewish ancestry. There is [name] known consanguinity.  GENETIC COUNSELING ASSESSMENT: Barry [name]. [place] is a 64 y.o. male with a family history of colon cancer and unknown cancers which is somewhat suggestive of a hereditary cancer syndrome and predisposition to cancer given more than three diagnoses of colon cancer in his siblings and unknown cancers in multiple generations of the family. We,  therefore, discussed and recommended the following at today's visit.   DISCUSSION: We discussed that 5 - 10% of cancer is hereditary, with most cases of hereditary colon cancer associated with Lynch syndrome, mutations in MLH1, MSH2, MSH6, PMS2, and EPCAM.  There are other genes that can be associated with hereditary cancer syndromes.  Type of cancer risk and level of risk are gene-specific.  We discussed that testing is beneficial for several reasons, including knowing about other cancer risks, identifying potential screening and risk-reduction options that may be appropriate, and to understand if other family members could be at risk for cancer and allow them to undergo genetic testing.  We reviewed the characteristics, features and inheritance patterns of hereditary cancer syndromes. We also discussed genetic testing, including the appropriate family members to test, the process of testing, insurance coverage and turn-around-time for results. We discussed the implications of a negative, positive, carrier and/or variant of uncertain significant result. We discussed that negative results would be uninformative given that Barry [name] does not have a personal history of cancer. We recommended Barry [name] pursue genetic testing for an expanded pan-cancer panel given the family history of unknown cancers.   The Multi-Cancer Panel offered by [facility] includes sequencing and/or deletion duplication testing of the following 85 genes: AIP, ALK, APC, ATM,  AXIN2,BAP1,  BARD1, BLM, BMPR1A, BRCA1, BRCA2, BRIP1, CASR, CDC73, CDH1, CDK4, CDKN1B, CDKN1C, CDKN2A (p14ARF), CDKN2A (p16INK4a), CEBPA, CHEK2, CTNNA1, DICER1, DIS3L2, EGFR (c.2369C>T, p.Thr790Met variant only), EPCAM (Deletion/duplication testing only), FH, FLCN, GATA2, GPC3, GREM1 (Promoter region deletion/duplication testing only), HOXB13 (c.251G>A, p.Gly84Glu), HRAS, KIT, MAX, MEN1, MET, MITF (c.952G>A, p.Glu318Lys variant only), MLH1, MSH2, MSH3, MSH6, MUTYH, NBN, NF1, NF2, NTHL1, PALB2, PDGFRA, PHOX2B, PMS2, POLD1, POLE, POT1, PRKAR1A, PTCH1, PTEN, RAD50, RAD51C, RAD51D, RB1, RECQL4, RET, RNF43, RUNX1, SDHAF2, SDHA (sequence changes only), SDHB, SDHC, SDHD, SMAD4, SMARCA4, SMARCB1, SMARCE1, STK11, SUFU, TERC, TERT, TMEM127, TP53, TSC1, TSC2, VHL, WRN and WT1.   Based on Barry [name]'s family history of colon cancer cancer, he meets NCCN medical criteria for genetic testing. Despite that he meets criteria, he may still have an out of pocket cost. We discussed that if his out of pocket cost for testing is over $100, the laboratory will call and confirm whether he wants to proceed with testing.  If the out of pocket cost of testing is less than $100 he will be billed by the genetic testing laboratory.   We discussed that some people do not want to undergo genetic testing due to fear of genetic discrimination.  A federal law called the Genetic Information Non-Discrimination Act (GINA) of 2008 helps protect individuals against genetic discrimination based on their genetic test results.  It impacts both health insurance and employment.  With health insurance, it protects against increased premiums, being kicked off insurance or being forced to take a test in order to be insured.  For employment it protects against hiring, firing and promoting decisions based on genetic test results.  GINA does not apply to those in the [employer], those who work for companies with less than 15 employees, and new life insurance or  long-term disability insurance policies.  Health status due to a cancer diagnosis is not protected under GINA.  PLAN: After considering the risks, benefits, and limitations, Barry [name]. [place] provided informed consent to pursue genetic testing and the blood sample was sent to [facility] for analysis of the Multi-Cancers Panel. Results should be available within approximately 3 weeks' time, at which point they will be disclosed by  telephone to Barry [name], as will any additional recommendations warranted by these results. Barry [name] will receive a summary of his genetic counseling visit and a copy of his results once available. This information will also be available in Epic.   Based on Barry [name]'s family history, we recommended his brothers, who were diagnosed with colon cancer, have genetic counseling and testing. Barry [name]. Con[place] will let us know if we can be of any assistance in coordinating genetic counseling and/or testing for these family members.   Lastly, we encouraged Barry [name]. [place] to remain in contact with cancer genetics annually so that we can continuously update the family history and inform him of any changes in cancer genetics and testing that may be of benefit for this family.   Barry [name]. Mossa's questions were answered to his satisfaction today. Our contact information was provided should additional questions or concerns arise. Thank you for the referral and allowing [place] to share in the care of your patient.   Rivka Baune M. Joette Catching, [place], [facility] Certified Film/video [email or web address] (P) [phone]   The patient was seen for a total of 55 minutes in face-to-face genetic counseling.  This patient was discussed with Drs. Magrinat, Lindi Adie and/or Burr Medico who agrees with the above.   _______________________________________________________________________ For Office Staff:  Number of people involved in session: 1 Was an Intern/ student involved with case: yes; genetic counseling intern, Layne Benton, observed  this case

## 2019-10-26 ENCOUNTER — Telehealth: Payer: Self-pay | Admitting: Genetic Counselor

## 2019-10-26 NOTE — Telephone Encounter (Signed)
Contacted patient in attempt to disclose results of genetic testing.  LVM with contact information requesting a call back.  

## 2019-10-29 ENCOUNTER — Other Ambulatory Visit: Payer: Self-pay

## 2019-10-29 ENCOUNTER — Telehealth: Payer: Self-pay | Admitting: Hematology & Oncology

## 2019-10-29 ENCOUNTER — Other Ambulatory Visit: Payer: Self-pay | Admitting: Nurse Practitioner

## 2019-10-29 ENCOUNTER — Inpatient Hospital Stay: Payer: Self-pay | Attending: Hematology & Oncology

## 2019-10-29 ENCOUNTER — Encounter: Payer: Self-pay | Admitting: Hematology & Oncology

## 2019-10-29 ENCOUNTER — Telehealth: Payer: Self-pay | Admitting: Genetic Counselor

## 2019-10-29 ENCOUNTER — Inpatient Hospital Stay (HOSPITAL_BASED_OUTPATIENT_CLINIC_OR_DEPARTMENT_OTHER): Payer: Self-pay | Admitting: Hematology & Oncology

## 2019-10-29 VITALS — BP 116/66 | HR 54 | Temp 98.5°F | Resp 24 | Wt 247.1 lb

## 2019-10-29 DIAGNOSIS — Z79899 Other long term (current) drug therapy: Secondary | ICD-10-CM | POA: Insufficient documentation

## 2019-10-29 DIAGNOSIS — R531 Weakness: Secondary | ICD-10-CM | POA: Insufficient documentation

## 2019-10-29 DIAGNOSIS — Z809 Family history of malignant neoplasm, unspecified: Secondary | ICD-10-CM

## 2019-10-29 DIAGNOSIS — D696 Thrombocytopenia, unspecified: Secondary | ICD-10-CM | POA: Insufficient documentation

## 2019-10-29 DIAGNOSIS — M5417 Radiculopathy, lumbosacral region: Secondary | ICD-10-CM

## 2019-10-29 DIAGNOSIS — E785 Hyperlipidemia, unspecified: Secondary | ICD-10-CM

## 2019-10-29 DIAGNOSIS — R161 Splenomegaly, not elsewhere classified: Secondary | ICD-10-CM | POA: Insufficient documentation

## 2019-10-29 DIAGNOSIS — D509 Iron deficiency anemia, unspecified: Secondary | ICD-10-CM | POA: Insufficient documentation

## 2019-10-29 LAB — CMP (CANCER CENTER ONLY)
ALT: 79 U/L — ABNORMAL HIGH (ref 0–44)
AST: 93 U/L — ABNORMAL HIGH (ref 15–41)
Albumin: 4.1 g/dL (ref 3.5–5.0)
Alkaline Phosphatase: 108 U/L (ref 38–126)
Anion gap: 5 (ref 5–15)
BUN: 16 mg/dL (ref 8–23)
CO2: 28 mmol/L (ref 22–32)
Calcium: 10.6 mg/dL — ABNORMAL HIGH (ref 8.9–10.3)
Chloride: 105 mmol/L (ref 98–111)
Creatinine: 1.28 mg/dL — ABNORMAL HIGH (ref 0.61–1.24)
GFR, Est AFR Am: >60 mL/min (ref >60)
GFR, Estimated: 60 mL/min — ABNORMAL LOW (ref >60)
Glucose, Bld: 86 mg/dL (ref 70–99)
Potassium: 5.1 mmol/L (ref 3.5–5.1)
Sodium: 138 mmol/L (ref 135–145)
Total Bilirubin: 1 mg/dL (ref 0.3–1.2)
Total Protein: 8.2 g/dL — ABNORMAL HIGH (ref 6.5–8.1)

## 2019-10-29 LAB — CBC WITH DIFFERENTIAL (CANCER CENTER ONLY)
Abs Immature Granulocytes: 0.01 10*3/uL (ref 0.00–0.07)
Basophils Absolute: 0.1 10*3/uL (ref 0.0–0.1)
Basophils Relative: 1 %
Eosinophils Absolute: 0.1 10*3/uL (ref 0.0–0.5)
Eosinophils Relative: 2 %
HCT: 37.1 % — ABNORMAL LOW (ref 39.0–52.0)
Hemoglobin: 11.9 g/dL — ABNORMAL LOW (ref 13.0–17.0)
Immature Granulocytes: 0 %
Lymphocytes Relative: 22 %
Lymphs Abs: 1.1 10*3/uL (ref 0.7–4.0)
MCH: 31.4 pg (ref 26.0–34.0)
MCHC: 32.1 g/dL (ref 30.0–36.0)
MCV: 97.9 fL (ref 80.0–100.0)
Monocytes Absolute: 0.5 10*3/uL (ref 0.1–1.0)
Monocytes Relative: 9 %
Neutro Abs: 3.3 10*3/uL (ref 1.7–7.7)
Neutrophils Relative %: 66 %
Platelet Count: 81 10*3/uL — ABNORMAL LOW (ref 150–400)
RBC: 3.79 MIL/uL — ABNORMAL LOW (ref 4.22–5.81)
RDW: 19 % — ABNORMAL HIGH (ref 11.5–15.5)
WBC Count: 5 10*3/uL (ref 4.0–10.5)
nRBC: 0 % (ref 0.0–0.2)

## 2019-10-29 LAB — HEMOGLOBIN A1C
Hgb A1c MFr Bld: 6.4 % — ABNORMAL HIGH (ref 4.8–5.6)
Mean Plasma Glucose: 136.98 mg/dL

## 2019-10-29 LAB — PLATELET BY CITRATE

## 2019-10-29 NOTE — Progress Notes (Signed)
Hematology and Oncology Follow Up Visit  Barry [name] [id number] [birth year] 64 y.o. 10/29/2019   Principle Diagnosis:  Thrombocytopenia with mild splenomegaly  Iron deficiency anemia   Current Therapy:   Observations  IV iron as indicated   Interim History:  Barry [name] is here today for follow-up.  His problem still is poor back.  This is still causing a lot of problems for him.  He gets weak in his legs.  He has to stop and sit down quite a bit.  I am not sure if he ever will have back surgery.  I think he sees the spinal doctor soon.  His platelet count has been holding pretty steady.  His platelet count today is 84,000.  I really do not see that this can be a problem with him having surgery.  His blood sugars are on the higher side.  He has had no bleeding.  He has had no bruising.  He has had no change in bowel or bladder habits.  There has been no fever.  He has had no cough or shortness of breath.    Overall, his performance status is ECOG 1.  .     Medications:  Allergies as of 10/29/2019   No Known Allergies     Medication List       Accurate as of October 29, 2019  2:12 PM. If you have any questions, ask your nurse or doctor.        STOP taking these medications   methylPREDNISolone 4 MG Tbpk tablet Commonly known as: MEDROL DOSEPAK Stopped by: Volanda Napoleon, MD   Semaglutide(0.25 or 0.5MG/DOS) 2 MG/1.5ML Sopn Stopped by: Volanda Napoleon, MD     TAKE these medications   acetaminophen 500 MG tablet Commonly known as: TYLENOL Take 1,000 mg by mouth every 8 (eight) hours as needed for moderate pain.   albuterol 108 (90 Base) MCG/ACT inhaler Commonly known as: VENTOLIN HFA Inhale 2 puffs into the lungs every 6 (six) hours as needed for wheezing or shortness of breath.   aspirin 81 MG chewable tablet Chew 81 mg by mouth daily.   atorvastatin 80 MG tablet Commonly known as: LIPITOR Take 80 mg by mouth at bedtime.   busPIRone 15 MG tablet Commonly known  as: BUSPAR TAKE 1 TABLET BY MOUTH THREE TIMES DAILY FOR ANXIETY   clopidogrel 75 MG tablet Commonly known as: PLAVIX Take one tablet by mouth once daily. NEEDS AN APPOINTMENT BEFORE ANYMORE FUTURE REFILLS.   CONTOUR NEXT EZ MONITOR w/Device Kit Test blood sugar three times daily E11.22   cyclobenzaprine 10 MG tablet Commonly known as: FLEXERIL Take 10 mg by mouth 2 (two) times daily as needed.   gabapentin 300 MG capsule Commonly known as: NEURONTIN Take 600 mg by mouth 3 (three) times daily.   glipiZIDE 5 MG tablet Commonly known as: GLUCOTROL Take 5 mg by mouth daily.   glucose blood test strip Commonly known as: Visual merchandiser Next Test Use as instructed   Insulin Pen Needle 32G X 4 MM Misc Use as Directed. Dx: E11.40   lisinopril 5 MG tablet Commonly known as: ZESTRIL TAKE 1 TABLET(5 MG) BY MOUTH DAILY   meloxicam 15 MG tablet Commonly known as: MOBIC Take 15 mg by mouth daily.   metFORMIN 1000 MG tablet Commonly known as: GLUCOPHAGE TAKE 1 TABLET(1000 MG) BY MOUTH TWICE DAILY   metoprolol tartrate 25 MG tablet Commonly known as: LOPRESSOR Take 0.5 tablets (12.5 mg total) by mouth 2 (  two) times daily.   pantoprazole 40 MG tablet Commonly known as: PROTONIX TAKE 1 TABLET(40 MG) BY MOUTH DAILY   rosuvastatin 40 MG tablet Commonly known as: Crestor Take 1 tablet (40 mg total) by mouth daily.   sertraline 100 MG tablet Commonly known as: ZOLOFT TAKE 2 TABLETS(200 MG) BY MOUTH DAILY   thiamine 100 MG tablet Commonly known as: Vitamin B-1 Take 100 mg by mouth daily.   Toujeo SoloStar 300 UNIT/ML Solostar Pen Generic drug: insulin glargine (1 Unit Dial) Inject 68 Units into the skin daily.       Allergies: No Known Allergies  Past Medical History, Surgical history, Social history, and Family History were reviewed and updated.  Review of Systems: Review of Systems  Constitutional: Negative.   HENT: Negative.   Eyes: Negative.   Respiratory:  Negative.   Cardiovascular: Negative.   Gastrointestinal: Negative.   Genitourinary: Negative.   Musculoskeletal: Negative.   Skin: Negative.   Neurological: Negative.   Endo/Heme/Allergies: Negative.   Psychiatric/Behavioral: Negative.      Physical Exam:  weight is 247 lb 1.9 oz (112.1 kg). His oral temperature is 98.5 F (36.9 C). His blood pressure is 116/66 and his pulse is 54 (abnormal). His respiration is 24 (abnormal) and oxygen saturation is 100%.   Wt Readings from Last 3 Encounters:  10/29/19 247 lb 1.9 oz (112.1 kg)  09/17/19 244 lb (110.7 kg)  05/14/19 266 lb (120.7 kg)    Physical Exam Vitals reviewed.  HENT:     Head: Normocephalic and atraumatic.  Eyes:     Pupils: Pupils are equal, round, and reactive to light.  Cardiovascular:     Rate and Rhythm: Normal rate and regular rhythm.     Heart sounds: Normal heart sounds.  Pulmonary:     Effort: Pulmonary effort is normal.     Breath sounds: Normal breath sounds.  Abdominal:     General: Bowel sounds are normal.     Palpations: Abdomen is soft.  Musculoskeletal:        General: No tenderness or deformity. Normal range of motion.     Cervical back: Normal range of motion.  Lymphadenopathy:     Cervical: No cervical adenopathy.  Skin:    General: Skin is warm and dry.     Findings: No erythema or rash.  Neurological:     Mental Status: He is alert and oriented to person, place, and time.  Psychiatric:        Behavior: Behavior normal.        Thought Content: Thought content normal.        Judgment: Judgment normal.      Lab Results  Component Value Date   WBC 5.0 10/29/2019   HGB 11.9 (L) 10/29/2019   HCT 37.1 (L) 10/29/2019   MCV 97.9 10/29/2019   PLT 81 (L) 10/29/2019   Lab Results  Component Value Date   FERRITIN 78 05/14/2019   IRON 56 05/14/2019   TIBC 353 05/14/2019   UIBC 297 05/14/2019   IRONPCTSAT 16 (L) 05/14/2019   Lab Results  Component Value Date   RETICCTPCT 1.1  07/14/2018   RBC 3.79 (L) 10/29/2019   No results found for: Nils Pyle [facility] Lab Results  Component Value Date   IGGSERUM 1,430 03/18/2017   IGMSERUM 93 03/18/2017   Lab Results  Component Value Date   ALBUMINELP 4.0 03/18/2017   A1GS 0.3 03/18/2017   A2GS 0.7 03/18/2017   BETS 0.6 03/18/2017  BETA2SER 0.4 03/18/2017   GAMS 1.3 03/18/2017   SPEI  03/18/2017     Comment:     . A poorly-defined band of restricted protein mobility is detected in the gamma globulins. It is unlikely that this may represent a monoclonal protein; however, immunofixation analysis is available if clinically indicated. .      Chemistry      Component Value Date/Time   NA 138 10/29/2019 1331   NA 139 04/14/2017 0824   K 5.1 10/29/2019 1331   CL 105 10/29/2019 1331   CO2 28 10/29/2019 1331   BUN 16 10/29/2019 1331   BUN 19 04/14/2017 0824   CREATININE 1.28 (H) 10/29/2019 1331   CREATININE 0.93 03/26/2019 0833      Component Value Date/Time   CALCIUM 10.6 (H) 10/29/2019 1331   ALKPHOS 108 10/29/2019 1331   AST 93 (H) 10/29/2019 1331   ALT 79 (H) 10/29/2019 1331   BILITOT 1.0 10/29/2019 1331       Impression and Plan: Barry [name] is a pleasant 64 yo caucasian gentleman with iron deficiency anemia and thrombocytopenia with mild splenomegaly.   His platelet count has been holding quite steady.  Platelet count has been steady for about a year.  If he does need back surgery, I do not see a problem with his platelets where they are right now.  I think that the platelet count will be adequate.  However, if spinal surgery feels the platelet count needs to be higher, we can always work on this.  I would really like to see him back in 6 weeks.    Volanda Napoleon, MD 10/4/20212:12 PM

## 2019-10-29 NOTE — Telephone Encounter (Signed)
Appointments scheduled calendar printed per 10/4 los 

## 2019-10-29 NOTE — Telephone Encounter (Signed)
Revealed negative genetic testing.  Discussed that we do not know why there is cancer in the family. It could be sporadic, due to a different gene that we are not testing, or maybe our current technology may not be able to pick something up. He may wish to keep in contact with genetics to keep up with whether additional testing may be needed. °  °  °

## 2019-10-30 ENCOUNTER — Encounter: Payer: Self-pay | Admitting: Genetic Counselor

## 2019-10-30 ENCOUNTER — Ambulatory Visit: Payer: Self-pay | Admitting: Genetic Counselor

## 2019-10-30 ENCOUNTER — Other Ambulatory Visit: Payer: Self-pay | Admitting: Nurse Practitioner

## 2019-10-30 DIAGNOSIS — Z809 Family history of malignant neoplasm, unspecified: Secondary | ICD-10-CM

## 2019-10-30 DIAGNOSIS — Z800 Family history of malignant neoplasm of digestive organs: Secondary | ICD-10-CM

## 2019-10-30 DIAGNOSIS — Z1379 Encounter for other screening for genetic and chromosomal anomalies: Secondary | ICD-10-CM | POA: Insufficient documentation

## 2019-10-30 LAB — LACTATE DEHYDROGENASE: LDH: 220 U/L — ABNORMAL HIGH (ref 98–192)

## 2019-10-30 NOTE — Telephone Encounter (Signed)
Left message on voicemail for patient to return call when available   

## 2019-10-30 NOTE — Telephone Encounter (Signed)
Patient has Crestor and Lipitor on his med list. Which should he be taking?

## 2019-10-30 NOTE — Telephone Encounter (Signed)
Called patient and LM with CB # to return call.

## 2019-10-30 NOTE — Telephone Encounter (Addendum)
Left message on voicemail for patient to return call when available. Reason for call: Inform patient he is due for an appointment and offer to schedule follow-up with Shanda Bumps.   Last OV, March 30, 2019 indicated patient needed to schedule a 4 month follow-up (due July 2021) with A1c prior.

## 2019-10-30 NOTE — Progress Notes (Signed)
HPI:  [name] was previously seen in the [place] clinic due to a family history of colon and other cancers and concerns regarding a hereditary predisposition to cancer. Please refer to our prior cancer genetics clinic note for more information regarding our discussion, assessment and recommendations, at the time. [name]'s recent genetic test results were disclosed to him, as were recommendations warranted by these results. These results and recommendations are discussed in more detail below.  CANCER HISTORY:  [name] does not have a personal history of cancer.   FAMILY HISTORY:  We obtained a detailed, 4-generation family history.  Significant diagnoses are listed below: Family History  Problem Relation Age of Onset  . Cancer Mother        unknown type; dx late 33s  . Cancer Father        unknown type; dx > 50yo  . Colon cancer Brother        dx late 23s  . Colon cancer Brother        dx late 66s; dx 71  . Cancer Sister        unknown type; dx late 26s  . Cancer Maternal Aunt        unknown type; dx > 15yo      [name] has one daughter, age 69, without a history of cancer.  [name] has two brothers with a history of colon cancer.  One brother, now 15, was diagnosed in his late 26s.  The other brother was reported to have two primary colon cancers--one diagnosed in his late 13s and the other diagnosed at 5.  One of his sisters passed away in her late 42s after a diagnosis of cancer.  [name] has one brother, age 43, and one sister, age 67, without a history of cancer.  Two of his brothers passed away during childhood and did not have cancer.  [name]'s mother had an unknown type of cancer diagnosed in her 38s.  [name]'s maternal aunt had an unknown cancer diagnosed after the age of 26.  No other maternal history of cancer was reported.  [name]'s father was diagnosed with an unknown type of cancer after the age of 22.  [name] has limited information about paternal  family history but reported no known cancer in other paternal relatives.   [name] is unaware of previous family history of genetic testing for hereditary cancer risks. Patient's maternal ancestors are of White/Caucasian descent, and paternal ancestors are of White/Caucasian descent. There is no reported Ashkenazi Jewish ancestry. There is no known consanguinity.  GENETIC TEST RESULTS: Genetic testing reported out on October 25, 2019 through [facility].  The Multi-Cancer Panel found no pathogenic mutations. The Multi-Cancer Panel offered by [facility] includes sequencing and/or deletion duplication testing of the following 85 genes: AIP, ALK, APC, ATM, AXIN2,BAP1,  BARD1, BLM, BMPR1A, BRCA1, BRCA2, BRIP1, CASR, CDC73, CDH1, CDK4, CDKN1B, CDKN1C, CDKN2A (p14ARF), CDKN2A (p16INK4a), CEBPA, CHEK2, CTNNA1, DICER1, DIS3L2, EGFR (c.2369C>T, p.Thr790Met variant only), EPCAM (Deletion/duplication testing only), FH, FLCN, GATA2, GPC3, GREM1 (Promoter region deletion/duplication testing only), HOXB13 (c.251G>A, p.Gly84Glu), HRAS, KIT, MAX, MEN1, MET, MITF (c.952G>A, p.Glu318Lys variant only), MLH1, MSH2, MSH3, MSH6, MUTYH, NBN, NF1, NF2, NTHL1, PALB2, PDGFRA, PHOX2B, PMS2, POLD1, POLE, POT1, PRKAR1A, PTCH1, PTEN, RAD50, RAD51C, RAD51D, RB1, RECQL4, RET, RNF43, RUNX1, SDHAF2, SDHA (sequence changes only), SDHB, SDHC, SDHD, SMAD4, SMARCA4, SMARCB1, SMARCE1, STK11, SUFU, TERC, TERT, TMEM127, TP53, TSC1, TSC2, VHL, WRN and WT1.   The test  report has been scanned into EPIC and is located under the Molecular Pathology section of the Results Review tab.  A portion of the result report is included below for reference.     We discussed with [name] that because current genetic testing is not perfect, it is possible there may be a gene mutation in one of these genes that current testing cannot detect, but that chance is small.  We also discussed, that there could be another gene that has not yet been discovered, or  that we have not yet tested, that is responsible for the cancer diagnoses in the family. It is also possible there is a hereditary cause for the cancer in the family that [name] did not inherit and therefore was not identified in his testing.  Therefore, it is important to remain in touch with cancer genetics in the future so that we can continue to offer [name] the most up to date genetic testing.   ADDITIONAL GENETIC TESTING: We discussed with [name] that his genetic testing was fairly extensive.  If there are genes identified to increase cancer risk that can be analyzed in the future, we would be happy to discuss and coordinate this testing at that time.    CANCER SCREENING RECOMMENDATIONS: [name]'s test result is considered negative (normal).  This means that we have not identified a hereditary cause for his family history of colon and other cancers at this time. Most cancers happen by chance and this negative test suggests that his cancer may fall into this category.    While reassuring, this does not definitively rule out a hereditary predisposition to cancer. It is still possible that there could be genetic mutations that are undetectable by current technology. There could be genetic mutations in genes that have not been tested or identified to increase cancer risk.  Therefore, it is recommended he continue to follow the cancer management and screening guidelines provided by his primary healthcare provider.   An individual's cancer risk and medical management are not determined by genetic test results alone. Overall cancer risk assessment incorporates additional factors, including personal medical history, family history, and any available genetic information that may result in a personalized plan for cancer prevention and surveillance  RECOMMENDATIONS FOR FAMILY MEMBERS:  Individuals in this family might be at some increased risk of developing cancer, over the general population risk, simply  due to the family history of cancer.  We recommended women in this family have a yearly mammogram beginning at age 33, or 64 years younger than the earliest onset of cancer, an annual clinical breast exam, and perform monthly breast self-exams. Women in this family should also have a gynecological exam as recommended by their primary provider.   First degree relatives of those with colon cancer should receive colonoscopies beginning at age 66, or 10 years prior to the earliest diagnosis of colon cancer in the family, and receive colonoscopies at least every 5 years, or as recommended by their gastroenterologist.  Other family members should be referred for colonoscopy starting at age 25.  It is also possible there is a hereditary cause for the cancer in Mr. Salvas's family that he did not inherit and therefore was not identified in him.  Based on Mr. Sattar's family history, we recommended his brothers, who were diagnosed with colon cancer, have genetic counseling and testing. Mr. Knippel will let us know if we can be of any assistance in coordinating genetic counseling and/or testing for these  family members.   FOLLOW-UP: Lastly, we discussed with Mr. Novick that cancer genetics is a rapidly advancing field and it is possible that new genetic tests will be appropriate for him and/or his family members in the future. We encouraged him to remain in contact with cancer genetics on an annual basis so we can update his personal and family histories and let him know of advances in cancer genetics that may benefit this family.   Our contact number was provided. Mr. Fiallo's questions were answered to his satisfaction, and he knows he is welcome to call us at anytime with additional questions or concerns.   Melford Tullier M. Joette Catching, [place], [facility] Certified Film/video [email or web address] (P) [phone]

## 2019-10-30 NOTE — Telephone Encounter (Signed)
Please call the pt and clarify what he is actually taking, he should only be taking one, likely he is only taking the crestor, if so remove lipitor from his list,  He needs a follow up appt so a 30 day supply with no refills should be provided Thank you.

## 2019-10-31 NOTE — Telephone Encounter (Signed)
Left message on voicemail for patient to return call when available   

## 2019-10-31 NOTE — Telephone Encounter (Signed)
Revealed negative genetic testing.  Discussed that we do not know why there is cancer in the family. It could be sporadic, due to a different gene that we are not testing, or maybe our current technology may not be able to pick something up. He may wish to keep in contact with genetics to keep up with whether additional testing may be needed.

## 2019-10-31 NOTE — Telephone Encounter (Signed)
Left another VM for patient to call to clarify which statin he is taking.

## 2019-10-31 NOTE — Telephone Encounter (Signed)
Spoke with patient, patient states he will check his calender and call back to reschedule.

## 2019-11-01 NOTE — Telephone Encounter (Signed)
VM again left for patient to return call.

## 2019-11-02 ENCOUNTER — Telehealth: Payer: Self-pay

## 2019-11-02 NOTE — Telephone Encounter (Signed)
When trying to fill a refill for Crestor it was discovered that patient had Crestor and Lipitor on his med list. After much back and forth he states he is taking both. He says he does not need refills on either one.   Patient is having back injections by Dr. Jola Schmidt at [facility] on 11/14/19. He (MD) wants an ok to stop his Plavix 7 days before the surgery. Pt stated they will test his INR 2 days prior to surgery.

## 2019-11-02 NOTE — Telephone Encounter (Signed)
Patient returned call and is not sure which medication he is taking. He stated he would go thru his pill bottles and call back to let me know.

## 2019-11-05 NOTE — Telephone Encounter (Signed)
Pt should STOP lipitor, he does not need to be on both medication. Lets call the pharmacy and have them STOP refill on lipitor, I question if he is getting these from 2 different pharmacies because this should have been an alert.

## 2019-11-06 NOTE — Telephone Encounter (Signed)
Removed Lipitor from med list. Called pharmacy and cancelled it as well.

## 2019-11-06 NOTE — Telephone Encounter (Signed)
Personally called pt to discuss risk vs benefit of stopping plavix. He is aware of the risk of clotting if he stops the plavix. He reports pain is severe and aware of risk but benefits outweigh.  He will resume plavix after procedure.  He is also needing a follow up with cardiologist and plans to make. He is also aware to stop lipitor and continue crestor.

## 2019-11-14 ENCOUNTER — Other Ambulatory Visit: Payer: Self-pay | Admitting: Nurse Practitioner

## 2019-12-10 ENCOUNTER — Encounter: Payer: Self-pay | Admitting: Hematology & Oncology

## 2019-12-10 ENCOUNTER — Inpatient Hospital Stay (HOSPITAL_BASED_OUTPATIENT_CLINIC_OR_DEPARTMENT_OTHER): Payer: Self-pay | Admitting: Hematology & Oncology

## 2019-12-10 ENCOUNTER — Inpatient Hospital Stay: Payer: Self-pay | Attending: Hematology & Oncology

## 2019-12-10 ENCOUNTER — Other Ambulatory Visit: Payer: Self-pay

## 2019-12-10 VITALS — BP 133/51 | HR 52 | Temp 98.4°F | Resp 16 | Wt 248.0 lb

## 2019-12-10 DIAGNOSIS — R161 Splenomegaly, not elsewhere classified: Secondary | ICD-10-CM | POA: Insufficient documentation

## 2019-12-10 DIAGNOSIS — D696 Thrombocytopenia, unspecified: Secondary | ICD-10-CM | POA: Insufficient documentation

## 2019-12-10 DIAGNOSIS — D509 Iron deficiency anemia, unspecified: Secondary | ICD-10-CM | POA: Insufficient documentation

## 2019-12-10 DIAGNOSIS — K219 Gastro-esophageal reflux disease without esophagitis: Secondary | ICD-10-CM

## 2019-12-10 DIAGNOSIS — M5417 Radiculopathy, lumbosacral region: Secondary | ICD-10-CM

## 2019-12-10 LAB — CBC WITH DIFFERENTIAL (CANCER CENTER ONLY)
Abs Immature Granulocytes: 0.01 10*3/uL (ref 0.00–0.07)
Basophils Absolute: 0.1 10*3/uL (ref 0.0–0.1)
Basophils Relative: 1 %
Eosinophils Absolute: 0.2 10*3/uL (ref 0.0–0.5)
Eosinophils Relative: 3 %
HCT: 39 % (ref 39.0–52.0)
Hemoglobin: 12.3 g/dL — ABNORMAL LOW (ref 13.0–17.0)
Immature Granulocytes: 0 %
Lymphocytes Relative: 23 %
Lymphs Abs: 1.3 10*3/uL (ref 0.7–4.0)
MCH: 32.1 pg (ref 26.0–34.0)
MCHC: 31.5 g/dL (ref 30.0–36.0)
MCV: 101.8 fL — ABNORMAL HIGH (ref 80.0–100.0)
Monocytes Absolute: 0.5 10*3/uL (ref 0.1–1.0)
Monocytes Relative: 8 %
Neutro Abs: 3.9 10*3/uL (ref 1.7–7.7)
Neutrophils Relative %: 65 %
Platelet Count: 80 10*3/uL — ABNORMAL LOW (ref 150–400)
RBC: 3.83 MIL/uL — ABNORMAL LOW (ref 4.22–5.81)
RDW: 15.7 % — ABNORMAL HIGH (ref 11.5–15.5)
WBC Count: 5.9 10*3/uL (ref 4.0–10.5)
nRBC: 0 % (ref 0.0–0.2)

## 2019-12-10 LAB — CMP (CANCER CENTER ONLY)
ALT: 29 U/L (ref 0–44)
AST: 40 U/L (ref 15–41)
Albumin: 4 g/dL (ref 3.5–5.0)
Alkaline Phosphatase: 116 U/L (ref 38–126)
Anion gap: 5 (ref 5–15)
BUN: 18 mg/dL (ref 8–23)
CO2: 29 mmol/L (ref 22–32)
Calcium: 10.3 mg/dL (ref 8.9–10.3)
Chloride: 102 mmol/L (ref 98–111)
Creatinine: 0.98 mg/dL (ref 0.61–1.24)
GFR, Estimated: >60 mL/min (ref >60)
Glucose, Bld: 195 mg/dL — ABNORMAL HIGH (ref 70–99)
Potassium: 4.8 mmol/L (ref 3.5–5.1)
Sodium: 136 mmol/L (ref 135–145)
Total Bilirubin: 0.6 mg/dL (ref 0.3–1.2)
Total Protein: 7.9 g/dL (ref 6.5–8.1)

## 2019-12-10 LAB — SAVE SMEAR(SSMR), FOR PROVIDER SLIDE REVIEW

## 2019-12-10 LAB — PLATELET BY CITRATE

## 2019-12-10 MED ORDER — PANTOPRAZOLE SODIUM 40 MG PO TBEC: DELAYED_RELEASE_TABLET | ORAL | 5 refills | Status: DC

## 2019-12-10 MED FILL — PANTOPRAZOLE SOD DR 40 MG T: 40 | 30 days supply | Qty: 30 | Fill #0

## 2019-12-10 NOTE — Progress Notes (Signed)
Hematology and Oncology Follow Up Visit  Barry [name] [id number] August 05, 1955 64 y.o. 12/10/2019   Principle Diagnosis:  Thrombocytopenia with mild splenomegaly  Iron deficiency anemia   Current Therapy:   Observations  IV iron as indicated   Interim History:  Barry [name] is here today for follow-up. Unfortunately, he still has not had his lower back taken care of. It sounds like he is going to get epidural steroids and not actual back surgery. He saw a pain specialist over at the [facility]. From what he was told, the platelet count was too low to do epidural steroids.  I will have to talk to the doctor to see what the cut off is for his platelets. Sounds like we may have to try Nplate to get the platelet count up.  Otherwise, his diabetes is a problem. His blood sugars are on the high side. He is on insulin and Glucotrol and Metformin. We have we have to stop the Lipitor. His LFTs will come back down. I guess is Dr. Harley [name] will have to figure out what to put him on now as far as cholesterol medications.  He has had no fever. He has had no bleeding. There has been no obvious change in bowel or bladder habits.  Currently, I was his performance status is ECOG 1.   Medications:  Allergies as of 12/10/2019   No Known Allergies     Medication List       Accurate as of December 10, 2019  3:32 PM. If you have any questions, ask your nurse or doctor.        acetaminophen 500 MG tablet Commonly known as: TYLENOL Take 1,000 mg by mouth every 8 (eight) hours as needed for moderate pain.   albuterol 108 (90 Base) MCG/ACT inhaler Commonly known as: VENTOLIN HFA Inhale 2 puffs into the lungs every 6 (six) hours as needed for wheezing or shortness of breath.   aspirin 81 MG chewable tablet Chew 81 mg by mouth daily.   buprenorphine 5 MCG/HR Ptwk Commonly known as: BUTRANS 1 patch once a week.   busPIRone 15 MG tablet Commonly known as: BUSPAR TAKE 1 TABLET BY MOUTH THREE TIMES  DAILY FOR ANXIETY   clopidogrel 75 MG tablet Commonly known as: PLAVIX APPOINTMENT OVERDUE Take 1 by mouth daily   CONTOUR NEXT EZ MONITOR w/Device Kit Test blood sugar three times daily E11.22   cyclobenzaprine 10 MG tablet Commonly known as: FLEXERIL Take 10 mg by mouth 2 (two) times daily as needed.   gabapentin 300 MG capsule Commonly known as: NEURONTIN Take 600 mg by mouth 3 (three) times daily.   gabapentin 600 MG tablet Commonly known as: NEURONTIN Take 600 mg by mouth 3 (three) times daily.   glipiZIDE 5 MG tablet Commonly known as: GLUCOTROL Take 5 mg by mouth daily.   glucose blood test strip Commonly known as: Visual merchandiser Next Test Use as instructed   Insulin Pen Needle 32G X 4 MM Misc Use as Directed. Dx: E11.40   lisinopril 5 MG tablet Commonly known as: ZESTRIL TAKE 1 TABLET(5 MG) BY MOUTH DAILY   meloxicam 15 MG tablet Commonly known as: MOBIC Take 15 mg by mouth daily.   metFORMIN 1000 MG tablet Commonly known as: GLUCOPHAGE TAKE 1 TABLET(1000 MG) BY MOUTH TWICE DAILY   metoprolol tartrate 25 MG tablet Commonly known as: LOPRESSOR Take 0.5 tablets (12.5 mg total) by mouth 2 (two) times daily.   pantoprazole 40 MG tablet Commonly  known as: PROTONIX TAKE 1 TABLET(40 MG) BY MOUTH DAILY   rosuvastatin 40 MG tablet Commonly known as: Crestor Take 1 tablet (40 mg total) by mouth daily.   sertraline 100 MG tablet Commonly known as: ZOLOFT TAKE 2 TABLETS(200 MG) BY MOUTH DAILY   thiamine 100 MG tablet Commonly known as: Vitamin B-1 Take 100 mg by mouth daily.   Toujeo SoloStar 300 UNIT/ML Solostar Pen Generic drug: insulin glargine (1 Unit Dial) Inject 68 Units into the skin daily.   traMADol 50 MG tablet Commonly known as: ULTRAM Take 50 mg by mouth 2 (two) times daily as needed.       Allergies: No Known Allergies  Past Medical History, Surgical history, Social history, and Family History were reviewed and updated.  Review of  Systems: Review of Systems  Constitutional: Negative.   HENT: Negative.   Eyes: Negative.   Respiratory: Negative.   Cardiovascular: Negative.   Gastrointestinal: Negative.   Genitourinary: Negative.   Musculoskeletal: Negative.   Skin: Negative.   Neurological: Negative.   Endo/Heme/Allergies: Negative.   Psychiatric/Behavioral: Negative.      Physical Exam:  weight is 248 lb (112.5 kg). His oral temperature is 98.4 F (36.9 C). His blood pressure is 133/51 (abnormal) and his pulse is 52 (abnormal). His respiration is 16 and oxygen saturation is 94%.   Wt Readings from Last 3 Encounters:  12/10/19 248 lb (112.5 kg)  10/29/19 247 lb 1.9 oz (112.1 kg)  09/17/19 244 lb (110.7 kg)    Physical Exam Vitals reviewed.  HENT:     Head: Normocephalic and atraumatic.  Eyes:     Pupils: Pupils are equal, round, and reactive to light.  Cardiovascular:     Rate and Rhythm: Normal rate and regular rhythm.     Heart sounds: Normal heart sounds.  Pulmonary:     Effort: Pulmonary effort is normal.     Breath sounds: Normal breath sounds.  Abdominal:     General: Bowel sounds are normal.     Palpations: Abdomen is soft.  Musculoskeletal:        General: No tenderness or deformity. Normal range of motion.     Cervical back: Normal range of motion.  Lymphadenopathy:     Cervical: No cervical adenopathy.  Skin:    General: Skin is warm and dry.     Findings: No erythema or rash.  Neurological:     Mental Status: He is alert and oriented to person, place, and time.  Psychiatric:        Behavior: Behavior normal.        Thought Content: Thought content normal.        Judgment: Judgment normal.      Lab Results  Component Value Date   WBC 5.9 12/10/2019   HGB 12.3 (L) 12/10/2019   HCT 39.0 12/10/2019   MCV 101.8 (H) 12/10/2019   PLT 80 (L) 12/10/2019   Lab Results  Component Value Date   FERRITIN 78 05/14/2019   IRON 56 05/14/2019   TIBC 353 05/14/2019   UIBC 297  05/14/2019   IRONPCTSAT 16 (L) 05/14/2019   Lab Results  Component Value Date   RETICCTPCT 1.1 07/14/2018   RBC 3.83 (L) 12/10/2019   No results found for: [facility]ils Pyle [facility] Lab Results  Component Value Date   IGGSERUM 1,430 03/18/2017   IGMSERUM 93 03/18/2017   Lab Results  Component Value Date   ALBUMINELP 4.0 03/18/2017   A1GS 0.3 03/18/2017   A2GS  0.7 03/18/2017   BETS 0.6 03/18/2017   BETA2SER 0.4 03/18/2017   GAMS 1.3 03/18/2017   SPEI  03/18/2017     Comment:     . A poorly-defined band of restricted protein mobility is detected in the gamma globulins. It is unlikely that this may represent a monoclonal protein; however, immunofixation analysis is available if clinically indicated. .      Chemistry      Component Value Date/Time   NA 136 12/10/2019 1407   NA 139 04/14/2017 0824   K 4.8 12/10/2019 1407   CL 102 12/10/2019 1407   CO2 29 12/10/2019 1407   BUN 18 12/10/2019 1407   BUN 19 04/14/2017 0824   CREATININE 0.98 12/10/2019 1407   CREATININE 0.93 03/26/2019 0833      Component Value Date/Time   CALCIUM 10.3 12/10/2019 1407   ALKPHOS 116 12/10/2019 1407   AST 40 12/10/2019 1407   ALT 29 12/10/2019 1407   BILITOT 0.6 12/10/2019 1407       Impression and Plan: Mr. Kimberlin is a pleasant 64 yo caucasian gentleman with iron deficiency anemia and thrombocytopenia with mild splenomegaly.   Personally, I think his platelet count is fine for an epidural injection. However, I have not the anesthesiologist so I will have to talk to him as to what he would like his platelet count to be in order to do epidural steroids.  I just feel bad that he is having all these problems. He does see quite a few doctors. This might be overwhelming for Mr. Paris.  He needs a refill on the Protonix. I will go ahead and do this.  He says the Butrans patches are not helping at all. I told him that unfortunately because this is not a cancer issue, we cannot  prescribe pain medication for him.  We will plan to get him back in a few weeks. Again we will have to see what his pain doctor would like his platelet count to be.    Volanda Napoleon, MD 11/15/20213:32 PM

## 2019-12-11 ENCOUNTER — Telehealth: Payer: Self-pay

## 2019-12-11 NOTE — Telephone Encounter (Signed)
Called and left a vm with f/u appt per 12/10/19 los//// AOM

## 2020-01-07 ENCOUNTER — Inpatient Hospital Stay: Payer: Self-pay

## 2020-01-07 ENCOUNTER — Inpatient Hospital Stay: Payer: Self-pay | Admitting: Hematology & Oncology

## 2020-01-09 ENCOUNTER — Inpatient Hospital Stay: Payer: Self-pay | Attending: Hematology & Oncology

## 2020-01-09 ENCOUNTER — Inpatient Hospital Stay (HOSPITAL_BASED_OUTPATIENT_CLINIC_OR_DEPARTMENT_OTHER): Payer: Self-pay | Admitting: Hematology & Oncology

## 2020-01-09 ENCOUNTER — Telehealth: Payer: Self-pay | Admitting: Hematology & Oncology

## 2020-01-09 ENCOUNTER — Encounter: Payer: Self-pay | Admitting: Hematology & Oncology

## 2020-01-09 ENCOUNTER — Other Ambulatory Visit: Payer: Self-pay

## 2020-01-09 VITALS — BP 128/56 | HR 84 | Temp 98.0°F | Resp 20 | Wt 253.1 lb

## 2020-01-09 DIAGNOSIS — Z79899 Other long term (current) drug therapy: Secondary | ICD-10-CM | POA: Insufficient documentation

## 2020-01-09 DIAGNOSIS — D509 Iron deficiency anemia, unspecified: Secondary | ICD-10-CM | POA: Insufficient documentation

## 2020-01-09 DIAGNOSIS — R161 Splenomegaly, not elsewhere classified: Secondary | ICD-10-CM | POA: Insufficient documentation

## 2020-01-09 DIAGNOSIS — M5417 Radiculopathy, lumbosacral region: Secondary | ICD-10-CM

## 2020-01-09 DIAGNOSIS — D696 Thrombocytopenia, unspecified: Secondary | ICD-10-CM

## 2020-01-09 DIAGNOSIS — K219 Gastro-esophageal reflux disease without esophagitis: Secondary | ICD-10-CM

## 2020-01-09 LAB — CBC WITH DIFFERENTIAL (CANCER CENTER ONLY)
Abs Immature Granulocytes: 0.01 10*3/uL (ref 0.00–0.07)
Basophils Absolute: 0 10*3/uL (ref 0.0–0.1)
Basophils Relative: 1 %
Eosinophils Absolute: 0.1 10*3/uL (ref 0.0–0.5)
Eosinophils Relative: 2 %
HCT: 35.8 % — ABNORMAL LOW (ref 39.0–52.0)
Hemoglobin: 11.6 g/dL — ABNORMAL LOW (ref 13.0–17.0)
Immature Granulocytes: 0 %
Lymphocytes Relative: 28 %
Lymphs Abs: 1.3 10*3/uL (ref 0.7–4.0)
MCH: 32 pg (ref 26.0–34.0)
MCHC: 32.4 g/dL (ref 30.0–36.0)
MCV: 98.6 fL (ref 80.0–100.0)
Monocytes Absolute: 0.4 10*3/uL (ref 0.1–1.0)
Monocytes Relative: 9 %
Neutro Abs: 2.9 10*3/uL (ref 1.7–7.7)
Neutrophils Relative %: 60 %
Platelet Count: 85 10*3/uL — ABNORMAL LOW (ref 150–400)
RBC: 3.63 MIL/uL — ABNORMAL LOW (ref 4.22–5.81)
RDW: 14.2 % (ref 11.5–15.5)
WBC Count: 4.8 10*3/uL (ref 4.0–10.5)
nRBC: 0 % (ref 0.0–0.2)

## 2020-01-09 LAB — CMP (CANCER CENTER ONLY)
ALT: 26 U/L (ref 0–44)
AST: 34 U/L (ref 15–41)
Albumin: 3.9 g/dL (ref 3.5–5.0)
Alkaline Phosphatase: 95 U/L (ref 38–126)
Anion gap: 4 — ABNORMAL LOW (ref 5–15)
BUN: 17 mg/dL (ref 8–23)
CO2: 30 mmol/L (ref 22–32)
Calcium: 9.9 mg/dL (ref 8.9–10.3)
Chloride: 103 mmol/L (ref 98–111)
Creatinine: 0.93 mg/dL (ref 0.61–1.24)
GFR, Estimated: >60 mL/min (ref >60)
Glucose, Bld: 152 mg/dL — ABNORMAL HIGH (ref 70–99)
Potassium: 4.6 mmol/L (ref 3.5–5.1)
Sodium: 137 mmol/L (ref 135–145)
Total Bilirubin: 0.5 mg/dL (ref 0.3–1.2)
Total Protein: 7.5 g/dL (ref 6.5–8.1)

## 2020-01-09 LAB — PLATELET BY CITRATE

## 2020-01-09 LAB — SAVE SMEAR(SSMR), FOR PROVIDER SLIDE REVIEW

## 2020-01-09 NOTE — Progress Notes (Signed)
Hematology and Oncology Follow Up Visit  Barry [name] [id number] 1955/08/05 64 y.o. 01/09/2020   Principle Diagnosis:  Thrombocytopenia with mild splenomegaly  Iron deficiency anemia   Current Therapy:   Observations  IV iron as indicated   Interim History:  Barry [name] is here today for follow-up. Unfortunately, he still has not had his lower back taken care of.  Is having a lot of problems at home.  He is under a lot of stress.  I just feel bad for him.  His back is still causing a lot of problems.  He actually lives with his ex-wife.  She had a stroke.  He is trying to help her.  He has had no bleeding or bruising.  I still need to talk to the doctors at his pain Center to see if they can do the epidural steroids.  He is not sleeping.  It is clear that he does not see any other doctor.  I am saddened by this.  I am not sure what the real problem is.  I think he probably wants to change doctors.  I told him that I just cannot be his family doctor.  He just has so many health issues.  He has diabetes.  He has a back issues. He has had cardiovascular issues.  He has had no fever. He has had no bleeding. There has been no obvious change in bowel or bladder habits.  Currently, I was his performance status is ECOG 1.   Medications:  Allergies as of 01/09/2020   No Known Allergies     Medication List       Accurate as of January 09, 2020  8:52 AM. If you have any questions, ask your nurse or doctor.        STOP taking these medications   buprenorphine 5 MCG/HR Ptwk Commonly known as: BUTRANS Stopped by: Volanda Napoleon, MD     TAKE these medications   acetaminophen 500 MG tablet Commonly known as: TYLENOL Take 1,000 mg by mouth every 8 (eight) hours as needed for moderate pain.   albuterol 108 (90 Base) MCG/ACT inhaler Commonly known as: VENTOLIN HFA Inhale 2 puffs into the lungs every 6 (six) hours as needed for wheezing or shortness of breath.   aspirin 81 MG  chewable tablet Chew 81 mg by mouth daily.   busPIRone 15 MG tablet Commonly known as: BUSPAR TAKE 1 TABLET BY MOUTH THREE TIMES DAILY FOR ANXIETY   clopidogrel 75 MG tablet Commonly known as: PLAVIX APPOINTMENT OVERDUE Take 1 by mouth daily   CONTOUR NEXT EZ MONITOR w/Device Kit Test blood sugar three times daily E11.22   cyclobenzaprine 10 MG tablet Commonly known as: FLEXERIL Take 10 mg by mouth 2 (two) times daily as needed.   gabapentin 300 MG capsule Commonly known as: NEURONTIN Take 600 mg by mouth 3 (three) times daily.   glipiZIDE 5 MG tablet Commonly known as: GLUCOTROL Take 5 mg by mouth daily.   glucose blood test strip Commonly known as: Visual merchandiser Next Test Use as instructed   Insulin Pen Needle 32G X 4 MM Misc Use as Directed. Dx: E11.40   lisinopril 5 MG tablet Commonly known as: ZESTRIL TAKE 1 TABLET(5 MG) BY MOUTH DAILY   meloxicam 15 MG tablet Commonly known as: MOBIC Take 15 mg by mouth daily.   metFORMIN 1000 MG tablet Commonly known as: GLUCOPHAGE TAKE 1 TABLET(1000 MG) BY MOUTH TWICE DAILY   metoprolol tartrate 25 MG tablet  Commonly known as: LOPRESSOR Take 0.5 tablets (12.5 mg total) by mouth 2 (two) times daily.   pantoprazole 40 MG tablet Commonly known as: PROTONIX TAKE 1 TABLET(40 MG) BY MOUTH DAILY   rosuvastatin 40 MG tablet Commonly known as: Crestor Take 1 tablet (40 mg total) by mouth daily.   sertraline 100 MG tablet Commonly known as: ZOLOFT TAKE 2 TABLETS(200 MG) BY MOUTH DAILY   thiamine 100 MG tablet Commonly known as: Vitamin B-1 Take 100 mg by mouth daily.   Toujeo SoloStar 300 UNIT/ML Solostar Pen Generic drug: insulin glargine (1 Unit Dial) Inject 68 Units into the skin daily.   traMADol 50 MG tablet Commonly known as: ULTRAM Take 50 mg by mouth 2 (two) times daily as needed.       Allergies: No Known Allergies  Past Medical History, Surgical history, Social history, and Family History were  reviewed and updated.  Review of Systems: Review of Systems  Constitutional: Negative.   HENT: Negative.   Eyes: Negative.   Respiratory: Negative.   Cardiovascular: Negative.   Gastrointestinal: Negative.   Genitourinary: Negative.   Musculoskeletal: Negative.   Skin: Negative.   Neurological: Negative.   Endo/Heme/Allergies: Negative.   Psychiatric/Behavioral: Negative.      Physical Exam:  weight is 253 lb 1.9 oz (114.8 kg). His oral temperature is 98 F (36.7 C). His blood pressure is 128/56 (abnormal) and his pulse is 84. His respiration is 20 and oxygen saturation is 99%.   Wt Readings from Last 3 Encounters:  01/09/20 253 lb 1.9 oz (114.8 kg)  12/10/19 248 lb (112.5 kg)  10/29/19 247 lb 1.9 oz (112.1 kg)    Physical Exam Vitals reviewed.  HENT:     Head: Normocephalic and atraumatic.  Eyes:     Pupils: Pupils are equal, round, and reactive to light.  Cardiovascular:     Rate and Rhythm: Normal rate and regular rhythm.     Heart sounds: Normal heart sounds.  Pulmonary:     Effort: Pulmonary effort is normal.     Breath sounds: Normal breath sounds.  Abdominal:     General: Bowel sounds are normal.     Palpations: Abdomen is soft.  Musculoskeletal:        General: No tenderness or deformity. Normal range of motion.     Cervical back: Normal range of motion.  Lymphadenopathy:     Cervical: No cervical adenopathy.  Skin:    General: Skin is warm and dry.     Findings: No erythema or rash.  Neurological:     Mental Status: He is alert and oriented to person, place, and time.  Psychiatric:        Behavior: Behavior normal.        Thought Content: Thought content normal.        Judgment: Judgment normal.      Lab Results  Component Value Date   WBC 4.8 01/09/2020   HGB 11.6 (L) 01/09/2020   HCT 35.8 (L) 01/09/2020   MCV 98.6 01/09/2020   PLT 85 (L) 01/09/2020   Lab Results  Component Value Date   FERRITIN 78 05/14/2019   IRON 56 05/14/2019    TIBC 353 05/14/2019   UIBC 297 05/14/2019   IRONPCTSAT 16 (L) 05/14/2019   Lab Results  Component Value Date   RETICCTPCT 1.1 07/14/2018   RBC 3.63 (L) 01/09/2020   No results found for: Nils Pyle [facility] Lab Results  Component Value Date   IGGSERUM 1,430 03/18/2017  IGMSERUM 93 03/18/2017   Lab Results  Component Value Date   ALBUMINELP 4.0 03/18/2017   A1GS 0.3 03/18/2017   A2GS 0.7 03/18/2017   BETS 0.6 03/18/2017   BETA2SER 0.4 03/18/2017   GAMS 1.3 03/18/2017   SPEI  03/18/2017     Comment:     . A poorly-defined band of restricted protein mobility is detected in the gamma globulins. It is unlikely that this may represent a monoclonal protein; however, immunofixation analysis is available if clinically indicated. .      Chemistry      Component Value Date/Time   NA 137 01/09/2020 0808   NA 139 04/14/2017 0824   K 4.6 01/09/2020 0808   CL 103 01/09/2020 0808   CO2 30 01/09/2020 0808   BUN 17 01/09/2020 0808   BUN 19 04/14/2017 0824   CREATININE 0.93 01/09/2020 0808   CREATININE 0.93 03/26/2019 0833      Component Value Date/Time   CALCIUM 9.9 01/09/2020 0808   ALKPHOS 95 01/09/2020 0808   AST 34 01/09/2020 0808   ALT 26 01/09/2020 0808   BILITOT 0.5 01/09/2020 0808       Impression and Plan: Barry [name] is a pleasant 64 yo caucasian gentleman with iron deficiency anemia and thrombocytopenia with mild splenomegaly.   Personally, I think his platelet count is fine for an epidural injection. However, I have not the anesthesiologist so I will have to talk to him as to what he would like his platelet count to be in order to do epidural steroids.  I just feel bad that he is having all these problems. He does see quite a few doctors. This might be overwhelming for Barry [name].  He says the Butrans patches are not helping at all. I told him that unfortunately because this is not a cancer issue, we cannot prescribe pain medication for him.  We  will plan to get him back in a few weeks. Again we will have to see what his pain doctor would like his platelet count to be.    Volanda Napoleon, MD 12/15/20218:52 AM

## 2020-01-09 NOTE — Telephone Encounter (Signed)
Appointments scheduled calendar printed per 12/15 los 

## 2020-01-10 MED ORDER — TEMAZEPAM 15 MG PO CAPS: 15.0000 mg | ORAL_CAPSULE | Freq: Every evening | ORAL | 0 refills | Status: DC | PRN

## 2020-02-27 ENCOUNTER — Other Ambulatory Visit: Payer: Self-pay

## 2020-02-27 ENCOUNTER — Inpatient Hospital Stay: Payer: Self-pay | Attending: Hematology & Oncology

## 2020-02-27 ENCOUNTER — Inpatient Hospital Stay (HOSPITAL_BASED_OUTPATIENT_CLINIC_OR_DEPARTMENT_OTHER): Payer: Self-pay | Admitting: Hematology & Oncology

## 2020-02-27 ENCOUNTER — Encounter: Payer: Self-pay | Admitting: Hematology & Oncology

## 2020-02-27 VITALS — BP 102/72 | HR 75 | Temp 97.5°F | Resp 18

## 2020-02-27 DIAGNOSIS — M545 Low back pain, unspecified: Secondary | ICD-10-CM | POA: Insufficient documentation

## 2020-02-27 DIAGNOSIS — R161 Splenomegaly, not elsewhere classified: Secondary | ICD-10-CM | POA: Insufficient documentation

## 2020-02-27 DIAGNOSIS — I214 Non-ST elevation (NSTEMI) myocardial infarction: Secondary | ICD-10-CM

## 2020-02-27 DIAGNOSIS — D509 Iron deficiency anemia, unspecified: Secondary | ICD-10-CM | POA: Insufficient documentation

## 2020-02-27 DIAGNOSIS — D696 Thrombocytopenia, unspecified: Secondary | ICD-10-CM | POA: Insufficient documentation

## 2020-02-27 LAB — CBC WITH DIFFERENTIAL (CANCER CENTER ONLY)
Abs Immature Granulocytes: 0.01 10*3/uL (ref 0.00–0.07)
Basophils Absolute: 0.1 10*3/uL (ref 0.0–0.1)
Basophils Relative: 1 %
Eosinophils Absolute: 0.2 10*3/uL (ref 0.0–0.5)
Eosinophils Relative: 3 %
HCT: 37.3 % — ABNORMAL LOW (ref 39.0–52.0)
Hemoglobin: 11.9 g/dL — ABNORMAL LOW (ref 13.0–17.0)
Immature Granulocytes: 0 %
Lymphocytes Relative: 30 %
Lymphs Abs: 1.7 10*3/uL (ref 0.7–4.0)
MCH: 30.6 pg (ref 26.0–34.0)
MCHC: 31.9 g/dL (ref 30.0–36.0)
MCV: 95.9 fL (ref 80.0–100.0)
Monocytes Absolute: 0.7 10*3/uL (ref 0.1–1.0)
Monocytes Relative: 13 %
Neutro Abs: 3 10*3/uL (ref 1.7–7.7)
Neutrophils Relative %: 53 %
Platelet Count: 89 10*3/uL — ABNORMAL LOW (ref 150–400)
RBC: 3.89 MIL/uL — ABNORMAL LOW (ref 4.22–5.81)
RDW: 14.5 % (ref 11.5–15.5)
WBC Count: 5.6 10*3/uL (ref 4.0–10.5)
nRBC: 0 % (ref 0.0–0.2)

## 2020-02-27 LAB — CMP (CANCER CENTER ONLY)
ALT: 29 U/L (ref 0–44)
AST: 39 U/L (ref 15–41)
Albumin: 4.2 g/dL (ref 3.5–5.0)
Alkaline Phosphatase: 95 U/L (ref 38–126)
Anion gap: 6 (ref 5–15)
BUN: 19 mg/dL (ref 8–23)
CO2: 29 mmol/L (ref 22–32)
Calcium: 9.9 mg/dL (ref 8.9–10.3)
Chloride: 104 mmol/L (ref 98–111)
Creatinine: 1.13 mg/dL (ref 0.61–1.24)
GFR, Estimated: >60 mL/min (ref >60)
Glucose, Bld: 78 mg/dL (ref 70–99)
Potassium: 4.7 mmol/L (ref 3.5–5.1)
Sodium: 139 mmol/L (ref 135–145)
Total Bilirubin: 0.5 mg/dL (ref 0.3–1.2)
Total Protein: 7.8 g/dL (ref 6.5–8.1)

## 2020-02-27 LAB — SAVE SMEAR(SSMR), FOR PROVIDER SLIDE REVIEW

## 2020-02-27 LAB — PLATELET BY CITRATE

## 2020-02-27 NOTE — Progress Notes (Signed)
Hematology and Oncology Follow Up Visit  GABRYEL FILES [id number] 06/15/1955 65 y.o. 02/27/2020   Principle Diagnosis:  Thrombocytopenia with mild splenomegaly  Iron deficiency anemia   Current Therapy:   Observations  IV iron as indicated   Interim History:  Mr. Troung is here today for follow-up.  His low back continues to be his big problem.  Hopefully, he will go to the pain doctor and get his injections.  His platelet count is 89,000.  This is more than adequate to be I be able to get an injection.  He said he stopped his Plavix.  Otherwise, he has had no other problems.  His problems are all nonhematologic.  He needs a new family doctor.  We will see about referring him to one of the doctors in our building.  He has had no issues with his wife.  She had a stroke.  They are living together.  His wife is actually his ex wife.  He seems to be eating okay.  His weight still has not gone down much.  There is no issues with bowels or bladder.  He is on a list of medications.  He really needs to see about getting all these adjusted.  Again, he really needs to have a family doctor.  He just does not want to go back to his doctor that he had been seeing.  Currently, I was his performance status is ECOG 1.   Medications:  Allergies as of 02/27/2020   No Known Allergies     Medication List       Accurate as of February 27, 2020 10:35 AM. If you have any questions, ask your nurse or doctor.        acetaminophen 500 MG tablet Commonly known as: TYLENOL Take 1,000 mg by mouth every 8 (eight) hours as needed for moderate pain.   albuterol 108 (90 Base) MCG/ACT inhaler Commonly known as: VENTOLIN HFA Inhale 2 puffs into the lungs every 6 (six) hours as needed for wheezing or shortness of breath.   aspirin 81 MG chewable tablet Chew 81 mg by mouth daily.   busPIRone 15 MG tablet Commonly known as: BUSPAR TAKE 1 TABLET BY MOUTH THREE TIMES DAILY FOR ANXIETY   clopidogrel 75 MG  tablet Commonly known as: PLAVIX APPOINTMENT OVERDUE Take 1 by mouth daily   CONTOUR NEXT EZ MONITOR w/Device Kit Test blood sugar three times daily E11.22   cyclobenzaprine 10 MG tablet Commonly known as: FLEXERIL Take 10 mg by mouth 2 (two) times daily as needed.   gabapentin 300 MG capsule Commonly known as: NEURONTIN Take 600 mg by mouth 3 (three) times daily.   glipiZIDE 5 MG tablet Commonly known as: GLUCOTROL Take 5 mg by mouth daily.   glucose blood test strip Commonly known as: Visual merchandiser Next Test Use as instructed   HYDROcodone-acetaminophen 5-325 MG tablet Commonly known as: NORCO/VICODIN Take 1 tablet by mouth 2 (two) times daily as needed.   Insulin Pen Needle 32G X 4 MM Misc Use as Directed. Dx: E11.40   lisinopril 5 MG tablet Commonly known as: ZESTRIL TAKE 1 TABLET(5 MG) BY MOUTH DAILY   meloxicam 15 MG tablet Commonly known as: MOBIC Take 15 mg by mouth daily.   metFORMIN 1000 MG tablet Commonly known as: GLUCOPHAGE TAKE 1 TABLET(1000 MG) BY MOUTH TWICE DAILY   metoprolol tartrate 25 MG tablet Commonly known as: LOPRESSOR Take 0.5 tablets (12.5 mg total) by mouth 2 (two) times daily.  pantoprazole 40 MG tablet Commonly known as: PROTONIX TAKE 1 TABLET(40 MG) BY MOUTH DAILY   rosuvastatin 40 MG tablet Commonly known as: Crestor Take 1 tablet (40 mg total) by mouth daily.   Semaglutide(0.25 or 0.5MG/DOS) 2 MG/1.5ML Sopn Inject 0.25 mg into the skin once a week.   sertraline 100 MG tablet Commonly known as: ZOLOFT TAKE 2 TABLETS(200 MG) BY MOUTH DAILY   temazepam 15 MG capsule Commonly known as: RESTORIL Take 1 capsule (15 mg total) by mouth at bedtime as needed for sleep.   thiamine 100 MG tablet Commonly known as: Vitamin B-1 Take 100 mg by mouth daily.   Toujeo SoloStar 300 UNIT/ML Solostar Pen Generic drug: insulin glargine (1 Unit Dial) Inject 68 Units into the skin daily.   traMADol 50 MG tablet Commonly known as:  ULTRAM Take 50 mg by mouth 2 (two) times daily as needed.       Allergies: No Known Allergies  Past Medical History, Surgical history, Social history, and Family History were reviewed and updated.  Review of Systems: Review of Systems  Constitutional: Negative.   HENT: Negative.   Eyes: Negative.   Respiratory: Negative.   Cardiovascular: Negative.   Gastrointestinal: Negative.   Genitourinary: Negative.   Musculoskeletal: Negative.   Skin: Negative.   Neurological: Negative.   Endo/Heme/Allergies: Negative.   Psychiatric/Behavioral: Negative.      Physical Exam:  oral temperature is 97.5 F (36.4 C) (abnormal). His blood pressure is 102/72 and his pulse is 75. His respiration is 18 and oxygen saturation is 99%.   Wt Readings from Last 3 Encounters:  01/09/20 253 lb 1.9 oz (114.8 kg)  12/10/19 248 lb (112.5 kg)  10/29/19 247 lb 1.9 oz (112.1 kg)    Physical Exam Vitals reviewed.  HENT:     Head: Normocephalic and atraumatic.  Eyes:     Pupils: Pupils are equal, round, and reactive to light.  Cardiovascular:     Rate and Rhythm: Normal rate and regular rhythm.     Heart sounds: Normal heart sounds.  Pulmonary:     Effort: Pulmonary effort is normal.     Breath sounds: Normal breath sounds.  Abdominal:     General: Bowel sounds are normal.     Palpations: Abdomen is soft.  Musculoskeletal:        General: No tenderness or deformity. Normal range of motion.     Cervical back: Normal range of motion.  Lymphadenopathy:     Cervical: No cervical adenopathy.  Skin:    General: Skin is warm and dry.     Findings: No erythema or rash.  Neurological:     Mental Status: He is alert and oriented to person, place, and time.  Psychiatric:        Behavior: Behavior normal.        Thought Content: Thought content normal.        Judgment: Judgment normal.      Lab Results  Component Value Date   WBC 5.6 02/27/2020   HGB 11.9 (L) 02/27/2020   HCT 37.3 (L)  02/27/2020   MCV 95.9 02/27/2020   PLT 89 (L) 02/27/2020   Lab Results  Component Value Date   FERRITIN 78 05/14/2019   IRON 56 05/14/2019   TIBC 353 05/14/2019   UIBC 297 05/14/2019   IRONPCTSAT 16 (L) 05/14/2019   Lab Results  Component Value Date   RETICCTPCT 1.1 07/14/2018   RBC 3.89 (L) 02/27/2020   No results found for:  [facility]ils P[facility] Lab Results  Component Value Date   IGGSERUM 1,430 03/18/2017   IGMSERUM 93 03/18/2017   Lab Results  Component Value Date   ALBUMINELP 4.0 03/18/2017   A1GS 0.3 03/18/2017   A2GS 0.7 03/18/2017   BETS 0.6 03/18/2017   BETA2SER 0.4 03/18/2017   GAMS 1.3 03/18/2017   SPEI  03/18/2017     Comment:     . A poorly-defined band of restricted protein mobility is detected in the gamma globulins. It is unlikely that this may represent a monoclonal protein; however, immunofixation analysis is available if clinically indicated. .      Chemistry      Component Value Date/Time   NA 139 02/27/2020 0906   NA 139 04/14/2017 0824   K 4.7 02/27/2020 0906   CL 104 02/27/2020 0906   CO2 29 02/27/2020 0906   BUN 19 02/27/2020 0906   BUN 19 04/14/2017 0824   CREATININE 1.13 02/27/2020 0906   CREATININE 0.93 03/26/2019 0833      Component Value Date/Time   CALCIUM 9.9 02/27/2020 0906   ALKPHOS 95 02/27/2020 0906   AST 39 02/27/2020 0906   ALT 29 02/27/2020 0906   BILITOT 0.5 02/27/2020 0906       Impression and Plan: Mr. Petersheim is a pleasant 65 yo caucasian gentleman with iron deficiency anemia and thrombocytopenia with mild splenomegaly.   Personally, I think his platelet count is fine for an epidural injection. However, I am not the anesthesiologist so I will have to talk to him as to what he would like his platelet count to be in order to do epidural steroids.  I just feel bad that he is having all these problems.  Again, he really needs to see the family doctor.  He needs a new one.    We will plan to get him  back in a few weeks. Again we will have to see what his pain doctor would like his platelet count to be.    Volanda Napoleon, MD 2/2/202210:35 AM

## 2020-04-01 DIAGNOSIS — M545 Low back pain, unspecified: Secondary | ICD-10-CM | POA: Diagnosis not present

## 2020-04-01 DIAGNOSIS — Y999 Unspecified external cause status: Secondary | ICD-10-CM | POA: Diagnosis not present

## 2020-04-01 DIAGNOSIS — G9589 Other specified diseases of spinal cord: Secondary | ICD-10-CM | POA: Diagnosis not present

## 2020-04-01 DIAGNOSIS — G8929 Other chronic pain: Secondary | ICD-10-CM | POA: Diagnosis not present

## 2020-04-01 DIAGNOSIS — M5136 Other intervertebral disc degeneration, lumbar region: Secondary | ICD-10-CM | POA: Diagnosis not present

## 2020-04-01 DIAGNOSIS — I639 Cerebral infarction, unspecified: Secondary | ICD-10-CM | POA: Diagnosis not present

## 2020-04-01 DIAGNOSIS — R2689 Other abnormalities of gait and mobility: Secondary | ICD-10-CM | POA: Diagnosis not present

## 2020-04-01 DIAGNOSIS — M25512 Pain in left shoulder: Secondary | ICD-10-CM | POA: Diagnosis not present

## 2020-04-01 DIAGNOSIS — R42 Dizziness and giddiness: Secondary | ICD-10-CM | POA: Diagnosis not present

## 2020-04-01 DIAGNOSIS — R0602 Shortness of breath: Secondary | ICD-10-CM | POA: Diagnosis not present

## 2020-04-01 DIAGNOSIS — Y92009 Unspecified place in unspecified non-institutional (private) residence as the place of occurrence of the external cause: Secondary | ICD-10-CM | POA: Diagnosis not present

## 2020-04-01 DIAGNOSIS — I25810 Atherosclerosis of coronary artery bypass graft(s) without angina pectoris: Secondary | ICD-10-CM | POA: Diagnosis not present

## 2020-04-01 DIAGNOSIS — Z20822 Contact with and (suspected) exposure to covid-19: Secondary | ICD-10-CM | POA: Diagnosis not present

## 2020-04-01 DIAGNOSIS — M5116 Intervertebral disc disorders with radiculopathy, lumbar region: Secondary | ICD-10-CM | POA: Diagnosis not present

## 2020-04-01 DIAGNOSIS — W19XXXA Unspecified fall, initial encounter: Secondary | ICD-10-CM | POA: Diagnosis not present

## 2020-04-01 DIAGNOSIS — I10 Essential (primary) hypertension: Secondary | ICD-10-CM | POA: Diagnosis not present

## 2020-04-01 DIAGNOSIS — I2510 Atherosclerotic heart disease of native coronary artery without angina pectoris: Secondary | ICD-10-CM | POA: Diagnosis not present

## 2020-04-01 DIAGNOSIS — F32A Depression, unspecified: Secondary | ICD-10-CM | POA: Diagnosis not present

## 2020-04-01 DIAGNOSIS — R531 Weakness: Secondary | ICD-10-CM | POA: Diagnosis not present

## 2020-04-01 DIAGNOSIS — S0990XA Unspecified injury of head, initial encounter: Secondary | ICD-10-CM | POA: Diagnosis not present

## 2020-04-01 DIAGNOSIS — E785 Hyperlipidemia, unspecified: Secondary | ICD-10-CM | POA: Diagnosis not present

## 2020-04-01 DIAGNOSIS — R519 Headache, unspecified: Secondary | ICD-10-CM | POA: Diagnosis not present

## 2020-04-01 DIAGNOSIS — R079 Chest pain, unspecified: Secondary | ICD-10-CM | POA: Diagnosis not present

## 2020-04-01 DIAGNOSIS — Z043 Encounter for examination and observation following other accident: Secondary | ICD-10-CM | POA: Diagnosis not present

## 2020-04-01 DIAGNOSIS — G4733 Obstructive sleep apnea (adult) (pediatric): Secondary | ICD-10-CM | POA: Diagnosis not present

## 2020-04-01 DIAGNOSIS — W1839XA Other fall on same level, initial encounter: Secondary | ICD-10-CM | POA: Diagnosis not present

## 2020-04-02 DIAGNOSIS — M545 Low back pain, unspecified: Secondary | ICD-10-CM | POA: Diagnosis not present

## 2020-04-02 DIAGNOSIS — I2510 Atherosclerotic heart disease of native coronary artery without angina pectoris: Secondary | ICD-10-CM | POA: Diagnosis not present

## 2020-04-02 DIAGNOSIS — Y92009 Unspecified place in unspecified non-institutional (private) residence as the place of occurrence of the external cause: Secondary | ICD-10-CM | POA: Diagnosis not present

## 2020-04-02 DIAGNOSIS — I10 Essential (primary) hypertension: Secondary | ICD-10-CM | POA: Diagnosis not present

## 2020-04-02 DIAGNOSIS — W19XXXA Unspecified fall, initial encounter: Secondary | ICD-10-CM | POA: Diagnosis not present

## 2020-04-02 DIAGNOSIS — M546 Pain in thoracic spine: Secondary | ICD-10-CM | POA: Diagnosis not present

## 2020-04-03 ENCOUNTER — Ambulatory Visit: Payer: Medicaid Other | Admitting: Internal Medicine

## 2020-04-03 DIAGNOSIS — Z7901 Long term (current) use of anticoagulants: Secondary | ICD-10-CM | POA: Diagnosis not present

## 2020-04-03 DIAGNOSIS — M5116 Intervertebral disc disorders with radiculopathy, lumbar region: Secondary | ICD-10-CM | POA: Diagnosis not present

## 2020-04-03 DIAGNOSIS — M1990 Unspecified osteoarthritis, unspecified site: Secondary | ICD-10-CM | POA: Diagnosis not present

## 2020-04-03 DIAGNOSIS — E785 Hyperlipidemia, unspecified: Secondary | ICD-10-CM | POA: Diagnosis not present

## 2020-04-03 DIAGNOSIS — M519 Unspecified thoracic, thoracolumbar and lumbosacral intervertebral disc disorder: Secondary | ICD-10-CM | POA: Diagnosis not present

## 2020-04-03 DIAGNOSIS — R296 Repeated falls: Secondary | ICD-10-CM | POA: Diagnosis not present

## 2020-04-03 DIAGNOSIS — G8929 Other chronic pain: Secondary | ICD-10-CM | POA: Diagnosis not present

## 2020-04-03 DIAGNOSIS — Z951 Presence of aortocoronary bypass graft: Secondary | ICD-10-CM | POA: Diagnosis not present

## 2020-04-03 DIAGNOSIS — R4182 Altered mental status, unspecified: Secondary | ICD-10-CM | POA: Diagnosis not present

## 2020-04-03 DIAGNOSIS — R531 Weakness: Secondary | ICD-10-CM | POA: Diagnosis not present

## 2020-04-03 DIAGNOSIS — Z743 Need for continuous supervision: Secondary | ICD-10-CM | POA: Diagnosis not present

## 2020-04-03 DIAGNOSIS — R258 Other abnormal involuntary movements: Secondary | ICD-10-CM | POA: Diagnosis not present

## 2020-04-03 DIAGNOSIS — E119 Type 2 diabetes mellitus without complications: Secondary | ICD-10-CM | POA: Diagnosis not present

## 2020-04-03 DIAGNOSIS — R5381 Other malaise: Secondary | ICD-10-CM | POA: Diagnosis not present

## 2020-04-03 DIAGNOSIS — M25512 Pain in left shoulder: Secondary | ICD-10-CM | POA: Diagnosis not present

## 2020-04-03 DIAGNOSIS — M549 Dorsalgia, unspecified: Secondary | ICD-10-CM | POA: Diagnosis not present

## 2020-04-03 DIAGNOSIS — I2510 Atherosclerotic heart disease of native coronary artery without angina pectoris: Secondary | ICD-10-CM | POA: Diagnosis not present

## 2020-04-03 DIAGNOSIS — M5126 Other intervertebral disc displacement, lumbar region: Secondary | ICD-10-CM | POA: Diagnosis not present

## 2020-04-03 DIAGNOSIS — R279 Unspecified lack of coordination: Secondary | ICD-10-CM | POA: Diagnosis not present

## 2020-04-03 DIAGNOSIS — I10 Essential (primary) hypertension: Secondary | ICD-10-CM | POA: Diagnosis not present

## 2020-04-03 DIAGNOSIS — Z794 Long term (current) use of insulin: Secondary | ICD-10-CM | POA: Diagnosis not present

## 2020-04-03 DIAGNOSIS — Y92009 Unspecified place in unspecified non-institutional (private) residence as the place of occurrence of the external cause: Secondary | ICD-10-CM | POA: Diagnosis not present

## 2020-04-03 DIAGNOSIS — R2689 Other abnormalities of gait and mobility: Secondary | ICD-10-CM | POA: Diagnosis not present

## 2020-04-03 DIAGNOSIS — M545 Low back pain, unspecified: Secondary | ICD-10-CM | POA: Diagnosis not present

## 2020-04-03 DIAGNOSIS — K769 Liver disease, unspecified: Secondary | ICD-10-CM | POA: Diagnosis not present

## 2020-04-03 DIAGNOSIS — G4733 Obstructive sleep apnea (adult) (pediatric): Secondary | ICD-10-CM | POA: Diagnosis not present

## 2020-04-03 DIAGNOSIS — W19XXXA Unspecified fall, initial encounter: Secondary | ICD-10-CM | POA: Diagnosis not present

## 2020-04-03 DIAGNOSIS — G9589 Other specified diseases of spinal cord: Secondary | ICD-10-CM | POA: Diagnosis not present

## 2020-04-03 DIAGNOSIS — F32A Depression, unspecified: Secondary | ICD-10-CM | POA: Diagnosis not present

## 2020-04-03 DIAGNOSIS — M419 Scoliosis, unspecified: Secondary | ICD-10-CM | POA: Diagnosis not present

## 2020-04-03 DIAGNOSIS — Z20822 Contact with and (suspected) exposure to covid-19: Secondary | ICD-10-CM | POA: Diagnosis not present

## 2020-04-03 DIAGNOSIS — I25810 Atherosclerosis of coronary artery bypass graft(s) without angina pectoris: Secondary | ICD-10-CM | POA: Diagnosis not present

## 2020-04-04 DIAGNOSIS — Y92009 Unspecified place in unspecified non-institutional (private) residence as the place of occurrence of the external cause: Secondary | ICD-10-CM | POA: Diagnosis not present

## 2020-04-04 DIAGNOSIS — I10 Essential (primary) hypertension: Secondary | ICD-10-CM | POA: Diagnosis not present

## 2020-04-04 DIAGNOSIS — W19XXXA Unspecified fall, initial encounter: Secondary | ICD-10-CM | POA: Diagnosis not present

## 2020-04-04 DIAGNOSIS — I2510 Atherosclerotic heart disease of native coronary artery without angina pectoris: Secondary | ICD-10-CM | POA: Diagnosis not present

## 2020-04-05 DIAGNOSIS — W19XXXA Unspecified fall, initial encounter: Secondary | ICD-10-CM | POA: Diagnosis not present

## 2020-04-05 DIAGNOSIS — I2510 Atherosclerotic heart disease of native coronary artery without angina pectoris: Secondary | ICD-10-CM | POA: Diagnosis not present

## 2020-04-05 DIAGNOSIS — I10 Essential (primary) hypertension: Secondary | ICD-10-CM | POA: Diagnosis not present

## 2020-04-05 DIAGNOSIS — Y92009 Unspecified place in unspecified non-institutional (private) residence as the place of occurrence of the external cause: Secondary | ICD-10-CM | POA: Diagnosis not present

## 2020-04-05 DIAGNOSIS — K769 Liver disease, unspecified: Secondary | ICD-10-CM | POA: Diagnosis not present

## 2020-04-06 DIAGNOSIS — R4182 Altered mental status, unspecified: Secondary | ICD-10-CM | POA: Diagnosis not present

## 2020-04-08 DIAGNOSIS — E119 Type 2 diabetes mellitus without complications: Secondary | ICD-10-CM | POA: Diagnosis not present

## 2020-04-08 DIAGNOSIS — F32A Depression, unspecified: Secondary | ICD-10-CM | POA: Diagnosis not present

## 2020-04-08 DIAGNOSIS — M1990 Unspecified osteoarthritis, unspecified site: Secondary | ICD-10-CM | POA: Diagnosis not present

## 2020-04-08 DIAGNOSIS — R296 Repeated falls: Secondary | ICD-10-CM | POA: Diagnosis not present

## 2020-04-08 DIAGNOSIS — F413 Other mixed anxiety disorders: Secondary | ICD-10-CM | POA: Diagnosis not present

## 2020-04-08 DIAGNOSIS — Z743 Need for continuous supervision: Secondary | ICD-10-CM | POA: Diagnosis not present

## 2020-04-08 DIAGNOSIS — I2510 Atherosclerotic heart disease of native coronary artery without angina pectoris: Secondary | ICD-10-CM | POA: Diagnosis not present

## 2020-04-08 DIAGNOSIS — W19XXXA Unspecified fall, initial encounter: Secondary | ICD-10-CM | POA: Diagnosis not present

## 2020-04-08 DIAGNOSIS — M419 Scoliosis, unspecified: Secondary | ICD-10-CM | POA: Diagnosis not present

## 2020-04-08 DIAGNOSIS — Z794 Long term (current) use of insulin: Secondary | ICD-10-CM | POA: Diagnosis not present

## 2020-04-08 DIAGNOSIS — Z7901 Long term (current) use of anticoagulants: Secondary | ICD-10-CM | POA: Diagnosis not present

## 2020-04-08 DIAGNOSIS — R279 Unspecified lack of coordination: Secondary | ICD-10-CM | POA: Diagnosis not present

## 2020-04-08 DIAGNOSIS — Z951 Presence of aortocoronary bypass graft: Secondary | ICD-10-CM | POA: Diagnosis not present

## 2020-04-08 DIAGNOSIS — I10 Essential (primary) hypertension: Secondary | ICD-10-CM | POA: Diagnosis not present

## 2020-04-08 DIAGNOSIS — F4320 Adjustment disorder, unspecified: Secondary | ICD-10-CM | POA: Diagnosis not present

## 2020-04-08 DIAGNOSIS — G8929 Other chronic pain: Secondary | ICD-10-CM | POA: Diagnosis not present

## 2020-04-08 DIAGNOSIS — M545 Low back pain, unspecified: Secondary | ICD-10-CM | POA: Diagnosis not present

## 2020-04-08 DIAGNOSIS — R7309 Other abnormal glucose: Secondary | ICD-10-CM | POA: Diagnosis not present

## 2020-04-08 DIAGNOSIS — Y92009 Unspecified place in unspecified non-institutional (private) residence as the place of occurrence of the external cause: Secondary | ICD-10-CM | POA: Diagnosis not present

## 2020-04-08 DIAGNOSIS — E7220 Disorder of urea cycle metabolism, unspecified: Secondary | ICD-10-CM | POA: Diagnosis not present

## 2020-04-08 DIAGNOSIS — R5381 Other malaise: Secondary | ICD-10-CM | POA: Diagnosis not present

## 2020-04-08 DIAGNOSIS — M549 Dorsalgia, unspecified: Secondary | ICD-10-CM | POA: Diagnosis not present

## 2020-04-08 DIAGNOSIS — G4700 Insomnia, unspecified: Secondary | ICD-10-CM | POA: Diagnosis not present

## 2020-04-08 DIAGNOSIS — R531 Weakness: Secondary | ICD-10-CM | POA: Diagnosis not present

## 2020-04-08 DIAGNOSIS — M519 Unspecified thoracic, thoracolumbar and lumbosacral intervertebral disc disorder: Secondary | ICD-10-CM | POA: Diagnosis not present

## 2020-04-08 DIAGNOSIS — R7989 Other specified abnormal findings of blood chemistry: Secondary | ICD-10-CM | POA: Diagnosis not present

## 2020-04-10 DIAGNOSIS — M1990 Unspecified osteoarthritis, unspecified site: Secondary | ICD-10-CM | POA: Diagnosis not present

## 2020-04-10 DIAGNOSIS — R296 Repeated falls: Secondary | ICD-10-CM | POA: Diagnosis not present

## 2020-04-10 DIAGNOSIS — M419 Scoliosis, unspecified: Secondary | ICD-10-CM | POA: Diagnosis not present

## 2020-04-10 DIAGNOSIS — I10 Essential (primary) hypertension: Secondary | ICD-10-CM | POA: Diagnosis not present

## 2020-04-10 DIAGNOSIS — R5381 Other malaise: Secondary | ICD-10-CM | POA: Diagnosis not present

## 2020-04-10 DIAGNOSIS — Z794 Long term (current) use of insulin: Secondary | ICD-10-CM | POA: Diagnosis not present

## 2020-04-10 DIAGNOSIS — E119 Type 2 diabetes mellitus without complications: Secondary | ICD-10-CM | POA: Diagnosis not present

## 2020-04-10 DIAGNOSIS — F32A Depression, unspecified: Secondary | ICD-10-CM | POA: Diagnosis not present

## 2020-04-15 DIAGNOSIS — M419 Scoliosis, unspecified: Secondary | ICD-10-CM | POA: Diagnosis not present

## 2020-04-15 DIAGNOSIS — F32A Depression, unspecified: Secondary | ICD-10-CM | POA: Diagnosis not present

## 2020-04-15 DIAGNOSIS — E119 Type 2 diabetes mellitus without complications: Secondary | ICD-10-CM | POA: Diagnosis not present

## 2020-04-15 DIAGNOSIS — Z794 Long term (current) use of insulin: Secondary | ICD-10-CM | POA: Diagnosis not present

## 2020-04-15 DIAGNOSIS — M1990 Unspecified osteoarthritis, unspecified site: Secondary | ICD-10-CM | POA: Diagnosis not present

## 2020-04-15 DIAGNOSIS — R296 Repeated falls: Secondary | ICD-10-CM | POA: Diagnosis not present

## 2020-04-15 DIAGNOSIS — I10 Essential (primary) hypertension: Secondary | ICD-10-CM | POA: Diagnosis not present

## 2020-04-15 DIAGNOSIS — R5381 Other malaise: Secondary | ICD-10-CM | POA: Diagnosis not present

## 2020-04-16 DIAGNOSIS — M419 Scoliosis, unspecified: Secondary | ICD-10-CM | POA: Diagnosis not present

## 2020-04-16 DIAGNOSIS — M1990 Unspecified osteoarthritis, unspecified site: Secondary | ICD-10-CM | POA: Diagnosis not present

## 2020-04-16 DIAGNOSIS — R5381 Other malaise: Secondary | ICD-10-CM | POA: Diagnosis not present

## 2020-04-17 ENCOUNTER — Inpatient Hospital Stay: Payer: Medicare HMO | Attending: Hematology & Oncology

## 2020-04-17 ENCOUNTER — Inpatient Hospital Stay: Payer: Medicare HMO | Admitting: Hematology & Oncology

## 2020-04-17 DIAGNOSIS — E7220 Disorder of urea cycle metabolism, unspecified: Secondary | ICD-10-CM | POA: Diagnosis not present

## 2020-04-18 DIAGNOSIS — E7220 Disorder of urea cycle metabolism, unspecified: Secondary | ICD-10-CM | POA: Diagnosis not present

## 2020-04-21 DIAGNOSIS — R531 Weakness: Secondary | ICD-10-CM | POA: Diagnosis not present

## 2020-04-21 DIAGNOSIS — W19XXXA Unspecified fall, initial encounter: Secondary | ICD-10-CM | POA: Diagnosis not present

## 2020-04-22 DIAGNOSIS — Z794 Long term (current) use of insulin: Secondary | ICD-10-CM | POA: Diagnosis not present

## 2020-04-22 DIAGNOSIS — F32A Depression, unspecified: Secondary | ICD-10-CM | POA: Diagnosis not present

## 2020-04-22 DIAGNOSIS — R296 Repeated falls: Secondary | ICD-10-CM | POA: Diagnosis not present

## 2020-04-22 DIAGNOSIS — I10 Essential (primary) hypertension: Secondary | ICD-10-CM | POA: Diagnosis not present

## 2020-04-22 DIAGNOSIS — M419 Scoliosis, unspecified: Secondary | ICD-10-CM | POA: Diagnosis not present

## 2020-04-22 DIAGNOSIS — R5381 Other malaise: Secondary | ICD-10-CM | POA: Diagnosis not present

## 2020-04-22 DIAGNOSIS — M1990 Unspecified osteoarthritis, unspecified site: Secondary | ICD-10-CM | POA: Diagnosis not present

## 2020-04-22 DIAGNOSIS — E119 Type 2 diabetes mellitus without complications: Secondary | ICD-10-CM | POA: Diagnosis not present

## 2020-04-23 DIAGNOSIS — F32A Depression, unspecified: Secondary | ICD-10-CM | POA: Diagnosis not present

## 2020-04-23 DIAGNOSIS — G4700 Insomnia, unspecified: Secondary | ICD-10-CM | POA: Diagnosis not present

## 2020-04-23 DIAGNOSIS — F4320 Adjustment disorder, unspecified: Secondary | ICD-10-CM | POA: Diagnosis not present

## 2020-04-23 DIAGNOSIS — F413 Other mixed anxiety disorders: Secondary | ICD-10-CM | POA: Diagnosis not present

## 2020-04-24 DIAGNOSIS — E7220 Disorder of urea cycle metabolism, unspecified: Secondary | ICD-10-CM | POA: Diagnosis not present

## 2020-04-25 DIAGNOSIS — R5381 Other malaise: Secondary | ICD-10-CM | POA: Diagnosis not present

## 2020-04-25 DIAGNOSIS — F32A Depression, unspecified: Secondary | ICD-10-CM | POA: Diagnosis not present

## 2020-04-25 DIAGNOSIS — M419 Scoliosis, unspecified: Secondary | ICD-10-CM | POA: Diagnosis not present

## 2020-04-25 DIAGNOSIS — R296 Repeated falls: Secondary | ICD-10-CM | POA: Diagnosis not present

## 2020-04-25 DIAGNOSIS — M1990 Unspecified osteoarthritis, unspecified site: Secondary | ICD-10-CM | POA: Diagnosis not present

## 2020-04-25 DIAGNOSIS — Z794 Long term (current) use of insulin: Secondary | ICD-10-CM | POA: Diagnosis not present

## 2020-04-25 DIAGNOSIS — E119 Type 2 diabetes mellitus without complications: Secondary | ICD-10-CM | POA: Diagnosis not present

## 2020-04-25 DIAGNOSIS — I10 Essential (primary) hypertension: Secondary | ICD-10-CM | POA: Diagnosis not present

## 2020-04-28 ENCOUNTER — Ambulatory Visit: Payer: Medicare HMO | Admitting: Internal Medicine

## 2020-04-28 DIAGNOSIS — I2510 Atherosclerotic heart disease of native coronary artery without angina pectoris: Secondary | ICD-10-CM | POA: Diagnosis not present

## 2020-04-28 DIAGNOSIS — M1990 Unspecified osteoarthritis, unspecified site: Secondary | ICD-10-CM | POA: Diagnosis not present

## 2020-04-28 DIAGNOSIS — M419 Scoliosis, unspecified: Secondary | ICD-10-CM | POA: Diagnosis not present

## 2020-04-29 ENCOUNTER — Other Ambulatory Visit: Payer: Self-pay

## 2020-04-29 NOTE — Patient Outreach (Signed)
[facility]) Care Management  04/29/2020  Erdem Naas Kentner 1955/03/28 [id number]     Transition of Care Referral  Referral Date: 04/29/2020  Referral Source: [facility] Discharge Report Date of Discharge: 04/27/2020 Facility: [facility] Insurance: [employer] Medicare    Outreach attempt # 1 to patient. No answer at present. RN CM left HIPAA compliant voicemail along with contact info.    Plan: RN CM will make outreach attempt to patient within 3-4 business days. RN CM will send unsuccessful outreach letter to patient.   Antionette Fairy, RN,BSN,CCM [facility] Care Management Telephonic Care Management Coordinator Direct Phone: [phone] Toll Free: [phone] Fax: [phone]

## 2020-04-30 ENCOUNTER — Other Ambulatory Visit: Payer: Self-pay

## 2020-04-30 DIAGNOSIS — M5116 Intervertebral disc disorders with radiculopathy, lumbar region: Secondary | ICD-10-CM | POA: Diagnosis not present

## 2020-04-30 DIAGNOSIS — D696 Thrombocytopenia, unspecified: Secondary | ICD-10-CM | POA: Diagnosis not present

## 2020-04-30 DIAGNOSIS — R296 Repeated falls: Secondary | ICD-10-CM | POA: Diagnosis not present

## 2020-04-30 NOTE — Patient Outreach (Signed)
[facility]) Care Management  04/30/2020  Barry [name] September 26, 1955 [id number]   Transition of Care Referral  Referral Date: 04/29/2020  Referral Source: [facility] Discharge Report Date of Discharge: 04/27/2020 Facility: [facility] Insurance: [employer] Medicare    Outreach attempt #2 to patient. No answer at present.     Plan: RN CM will make outreach attempt to patient within 3-4 business days.  Antionette Fairy, RN,BSN,CCM [facility] Care Management Telephonic Care Management Coordinator Direct Phone: [phone] Toll Free: [phone] Fax: [phone]

## 2020-05-02 ENCOUNTER — Other Ambulatory Visit: Payer: Self-pay

## 2020-05-02 DIAGNOSIS — E538 Deficiency of other specified B group vitamins: Secondary | ICD-10-CM | POA: Diagnosis not present

## 2020-05-02 DIAGNOSIS — E7800 Pure hypercholesterolemia, unspecified: Secondary | ICD-10-CM | POA: Diagnosis not present

## 2020-05-02 DIAGNOSIS — E119 Type 2 diabetes mellitus without complications: Secondary | ICD-10-CM | POA: Diagnosis not present

## 2020-05-02 DIAGNOSIS — M5136 Other intervertebral disc degeneration, lumbar region: Secondary | ICD-10-CM | POA: Diagnosis not present

## 2020-05-02 DIAGNOSIS — M5116 Intervertebral disc disorders with radiculopathy, lumbar region: Secondary | ICD-10-CM | POA: Diagnosis not present

## 2020-05-02 DIAGNOSIS — I10 Essential (primary) hypertension: Secondary | ICD-10-CM | POA: Diagnosis not present

## 2020-05-02 DIAGNOSIS — G8929 Other chronic pain: Secondary | ICD-10-CM | POA: Diagnosis not present

## 2020-05-02 DIAGNOSIS — G4733 Obstructive sleep apnea (adult) (pediatric): Secondary | ICD-10-CM | POA: Diagnosis not present

## 2020-05-02 DIAGNOSIS — I2510 Atherosclerotic heart disease of native coronary artery without angina pectoris: Secondary | ICD-10-CM | POA: Diagnosis not present

## 2020-05-02 NOTE — Patient Outreach (Signed)
[facility]) Care Management  05/02/2020  Barry [name] 04/10/1955 [id number]   Transition of Care Referral  Referral Date:04/29/2020 Referral Source:[employer] Discharge Report Date of Discharge:04/27/2020 Facility:[facility] Insurance:[employer] Medicare   Unsuccessful outreach attempt # 3 to patient.     Plan: RN CM will make outreach attempt to patient within 3-4 wks if no response from letter mailed to patient.  Antionette Fairy, RN,BSN,CCM [name] Care Management Telephonic Care Management Coordinator Direct Phone: [phone] Toll Free: [phone] Fax: [phone]

## 2020-05-04 DIAGNOSIS — R296 Repeated falls: Secondary | ICD-10-CM | POA: Diagnosis not present

## 2020-05-04 DIAGNOSIS — E119 Type 2 diabetes mellitus without complications: Secondary | ICD-10-CM | POA: Diagnosis not present

## 2020-05-04 DIAGNOSIS — Z7409 Other reduced mobility: Secondary | ICD-10-CM | POA: Diagnosis not present

## 2020-05-04 DIAGNOSIS — Z7984 Long term (current) use of oral hypoglycemic drugs: Secondary | ICD-10-CM | POA: Diagnosis not present

## 2020-05-04 DIAGNOSIS — R29898 Other symptoms and signs involving the musculoskeletal system: Secondary | ICD-10-CM | POA: Diagnosis not present

## 2020-05-04 DIAGNOSIS — M549 Dorsalgia, unspecified: Secondary | ICD-10-CM | POA: Diagnosis not present

## 2020-05-04 DIAGNOSIS — M5136 Other intervertebral disc degeneration, lumbar region: Secondary | ICD-10-CM | POA: Diagnosis not present

## 2020-05-04 DIAGNOSIS — R1310 Dysphagia, unspecified: Secondary | ICD-10-CM | POA: Diagnosis not present

## 2020-05-04 DIAGNOSIS — R531 Weakness: Secondary | ICD-10-CM | POA: Diagnosis not present

## 2020-05-04 DIAGNOSIS — Z794 Long term (current) use of insulin: Secondary | ICD-10-CM | POA: Diagnosis not present

## 2020-05-04 DIAGNOSIS — M5459 Other low back pain: Secondary | ICD-10-CM | POA: Diagnosis not present

## 2020-05-04 DIAGNOSIS — E875 Hyperkalemia: Secondary | ICD-10-CM | POA: Diagnosis not present

## 2020-05-04 DIAGNOSIS — I10 Essential (primary) hypertension: Secondary | ICD-10-CM | POA: Diagnosis not present

## 2020-05-04 DIAGNOSIS — D696 Thrombocytopenia, unspecified: Secondary | ICD-10-CM | POA: Diagnosis not present

## 2020-05-04 DIAGNOSIS — I2510 Atherosclerotic heart disease of native coronary artery without angina pectoris: Secondary | ICD-10-CM | POA: Diagnosis not present

## 2020-05-05 DIAGNOSIS — R9431 Abnormal electrocardiogram [ECG] [EKG]: Secondary | ICD-10-CM | POA: Diagnosis not present

## 2020-05-05 DIAGNOSIS — R161 Splenomegaly, not elsewhere classified: Secondary | ICD-10-CM | POA: Diagnosis not present

## 2020-05-05 DIAGNOSIS — E1169 Type 2 diabetes mellitus with other specified complication: Secondary | ICD-10-CM | POA: Diagnosis not present

## 2020-05-05 DIAGNOSIS — Z794 Long term (current) use of insulin: Secondary | ICD-10-CM | POA: Diagnosis not present

## 2020-05-05 DIAGNOSIS — I10 Essential (primary) hypertension: Secondary | ICD-10-CM | POA: Diagnosis not present

## 2020-05-05 DIAGNOSIS — D696 Thrombocytopenia, unspecified: Secondary | ICD-10-CM | POA: Diagnosis not present

## 2020-05-05 DIAGNOSIS — E875 Hyperkalemia: Secondary | ICD-10-CM | POA: Diagnosis not present

## 2020-05-05 DIAGNOSIS — R296 Repeated falls: Secondary | ICD-10-CM | POA: Diagnosis not present

## 2020-05-05 DIAGNOSIS — D61818 Other pancytopenia: Secondary | ICD-10-CM | POA: Diagnosis not present

## 2020-05-05 DIAGNOSIS — R29898 Other symptoms and signs involving the musculoskeletal system: Secondary | ICD-10-CM | POA: Diagnosis not present

## 2020-05-06 DIAGNOSIS — Z794 Long term (current) use of insulin: Secondary | ICD-10-CM | POA: Diagnosis not present

## 2020-05-06 DIAGNOSIS — R296 Repeated falls: Secondary | ICD-10-CM | POA: Diagnosis not present

## 2020-05-06 DIAGNOSIS — R29898 Other symptoms and signs involving the musculoskeletal system: Secondary | ICD-10-CM | POA: Diagnosis not present

## 2020-05-06 DIAGNOSIS — D696 Thrombocytopenia, unspecified: Secondary | ICD-10-CM | POA: Diagnosis not present

## 2020-05-06 DIAGNOSIS — E1169 Type 2 diabetes mellitus with other specified complication: Secondary | ICD-10-CM | POA: Diagnosis not present

## 2020-05-06 DIAGNOSIS — I10 Essential (primary) hypertension: Secondary | ICD-10-CM | POA: Diagnosis not present

## 2020-05-06 DIAGNOSIS — M5136 Other intervertebral disc degeneration, lumbar region: Secondary | ICD-10-CM | POA: Diagnosis not present

## 2020-05-07 ENCOUNTER — Ambulatory Visit: Payer: Medicare HMO | Admitting: Internal Medicine

## 2020-05-09 DIAGNOSIS — R29898 Other symptoms and signs involving the musculoskeletal system: Secondary | ICD-10-CM | POA: Diagnosis not present

## 2020-05-09 DIAGNOSIS — E119 Type 2 diabetes mellitus without complications: Secondary | ICD-10-CM | POA: Diagnosis not present

## 2020-05-09 DIAGNOSIS — Z794 Long term (current) use of insulin: Secondary | ICD-10-CM | POA: Diagnosis not present

## 2020-05-09 DIAGNOSIS — G8929 Other chronic pain: Secondary | ICD-10-CM | POA: Diagnosis not present

## 2020-05-09 DIAGNOSIS — Z7389 Other problems related to life management difficulty: Secondary | ICD-10-CM | POA: Diagnosis not present

## 2020-05-09 DIAGNOSIS — M545 Low back pain, unspecified: Secondary | ICD-10-CM | POA: Diagnosis not present

## 2020-05-09 DIAGNOSIS — Z9181 History of falling: Secondary | ICD-10-CM | POA: Diagnosis not present

## 2020-05-20 DIAGNOSIS — R079 Chest pain, unspecified: Secondary | ICD-10-CM | POA: Diagnosis not present

## 2020-05-20 DIAGNOSIS — R7989 Other specified abnormal findings of blood chemistry: Secondary | ICD-10-CM | POA: Diagnosis not present

## 2020-05-20 DIAGNOSIS — T383X1A Poisoning by insulin and oral hypoglycemic [antidiabetic] drugs, accidental (unintentional), initial encounter: Secondary | ICD-10-CM | POA: Diagnosis not present

## 2020-05-20 DIAGNOSIS — G8929 Other chronic pain: Secondary | ICD-10-CM | POA: Diagnosis not present

## 2020-05-20 DIAGNOSIS — E872 Acidosis: Secondary | ICD-10-CM | POA: Diagnosis not present

## 2020-05-20 DIAGNOSIS — Y998 Other external cause status: Secondary | ICD-10-CM | POA: Diagnosis not present

## 2020-05-20 DIAGNOSIS — E160 Drug-induced hypoglycemia without coma: Secondary | ICD-10-CM | POA: Diagnosis not present

## 2020-05-20 DIAGNOSIS — Z20822 Contact with and (suspected) exposure to covid-19: Secondary | ICD-10-CM | POA: Diagnosis not present

## 2020-05-20 DIAGNOSIS — M5431 Sciatica, right side: Secondary | ICD-10-CM | POA: Diagnosis not present

## 2020-05-20 DIAGNOSIS — E1169 Type 2 diabetes mellitus with other specified complication: Secondary | ICD-10-CM | POA: Diagnosis not present

## 2020-05-20 DIAGNOSIS — R112 Nausea with vomiting, unspecified: Secondary | ICD-10-CM | POA: Diagnosis not present

## 2020-05-20 DIAGNOSIS — G4733 Obstructive sleep apnea (adult) (pediatric): Secondary | ICD-10-CM | POA: Diagnosis not present

## 2020-05-20 DIAGNOSIS — E11649 Type 2 diabetes mellitus with hypoglycemia without coma: Secondary | ICD-10-CM | POA: Diagnosis not present

## 2020-05-20 DIAGNOSIS — M5136 Other intervertebral disc degeneration, lumbar region: Secondary | ICD-10-CM | POA: Diagnosis not present

## 2020-05-20 DIAGNOSIS — M545 Low back pain, unspecified: Secondary | ICD-10-CM | POA: Diagnosis not present

## 2020-05-20 DIAGNOSIS — X58XXXA Exposure to other specified factors, initial encounter: Secondary | ICD-10-CM | POA: Diagnosis not present

## 2020-05-20 DIAGNOSIS — T383X5A Adverse effect of insulin and oral hypoglycemic [antidiabetic] drugs, initial encounter: Secondary | ICD-10-CM | POA: Diagnosis not present

## 2020-05-20 DIAGNOSIS — R296 Repeated falls: Secondary | ICD-10-CM | POA: Diagnosis not present

## 2020-05-21 DIAGNOSIS — D696 Thrombocytopenia, unspecified: Secondary | ICD-10-CM | POA: Diagnosis not present

## 2020-05-21 DIAGNOSIS — R531 Weakness: Secondary | ICD-10-CM | POA: Diagnosis not present

## 2020-05-21 DIAGNOSIS — M5136 Other intervertebral disc degeneration, lumbar region: Secondary | ICD-10-CM | POA: Diagnosis not present

## 2020-05-21 DIAGNOSIS — E11649 Type 2 diabetes mellitus with hypoglycemia without coma: Secondary | ICD-10-CM | POA: Diagnosis not present

## 2020-05-21 DIAGNOSIS — I10 Essential (primary) hypertension: Secondary | ICD-10-CM | POA: Diagnosis not present

## 2020-05-21 DIAGNOSIS — R000 Tachycardia, unspecified: Secondary | ICD-10-CM | POA: Diagnosis not present

## 2020-05-23 ENCOUNTER — Other Ambulatory Visit: Payer: Self-pay

## 2020-05-23 NOTE — Patient Outreach (Signed)
[facility]) Care Management  05/23/2020  Barry [name] [birth year] [id number]   Transition of Care Referral  Referral Date:04/29/2020 Referral Source:[employer] Discharge Report Date of Discharge:04/27/2020 Facility:[facility] Insurance:[employer] Medicare   Multiple attempts to establish contact with patient without success. No response from letter mailed to patient. Case is being closed at this time.    Plan: RN CM will close case at this time.   Antionette Fairy, RN,BSN,CCM [facility] Care Management Telephonic Care Management Coordinator Direct Phone: [phone] Toll Free: [phone] Fax: [phone]

## 2020-06-15 ENCOUNTER — Other Ambulatory Visit: Payer: Self-pay | Admitting: Nurse Practitioner

## 2020-06-15 ENCOUNTER — Other Ambulatory Visit: Payer: Self-pay | Admitting: Hematology & Oncology

## 2020-06-15 DIAGNOSIS — E1140 Type 2 diabetes mellitus with diabetic neuropathy, unspecified: Secondary | ICD-10-CM

## 2021-12-29 ENCOUNTER — Encounter: Payer: Self-pay | Admitting: Family

## 2021-12-30 ENCOUNTER — Other Ambulatory Visit: Payer: Self-pay

## 2021-12-30 ENCOUNTER — Encounter (HOSPITAL_COMMUNITY): Payer: Self-pay

## 2021-12-30 ENCOUNTER — Emergency Department (HOSPITAL_COMMUNITY)
Admission: EM | Admit: 2021-12-30 | Discharge: 2021-12-30 | Disposition: A | Discharge status: Home / Self Care | Payer: Medicare HMO | Attending: Emergency Medicine | Admitting: Emergency Medicine

## 2021-12-30 ENCOUNTER — Emergency Department (HOSPITAL_COMMUNITY): Payer: Medicare HMO

## 2021-12-30 DIAGNOSIS — W19XXXA Unspecified fall, initial encounter: Secondary | ICD-10-CM

## 2021-12-30 DIAGNOSIS — Z951 Presence of aortocoronary bypass graft: Secondary | ICD-10-CM | POA: Insufficient documentation

## 2021-12-30 DIAGNOSIS — Z794 Long term (current) use of insulin: Secondary | ICD-10-CM | POA: Insufficient documentation

## 2021-12-30 DIAGNOSIS — I2510 Atherosclerotic heart disease of native coronary artery without angina pectoris: Secondary | ICD-10-CM | POA: Diagnosis not present

## 2021-12-30 DIAGNOSIS — E119 Type 2 diabetes mellitus without complications: Secondary | ICD-10-CM | POA: Diagnosis not present

## 2021-12-30 DIAGNOSIS — Z7982 Long term (current) use of aspirin: Secondary | ICD-10-CM | POA: Insufficient documentation

## 2021-12-30 DIAGNOSIS — Z7984 Long term (current) use of oral hypoglycemic drugs: Secondary | ICD-10-CM | POA: Diagnosis not present

## 2021-12-30 DIAGNOSIS — M549 Dorsalgia, unspecified: Secondary | ICD-10-CM | POA: Diagnosis present

## 2021-12-30 DIAGNOSIS — I10 Essential (primary) hypertension: Secondary | ICD-10-CM | POA: Diagnosis not present

## 2021-12-30 DIAGNOSIS — W06XXXA Fall from bed, initial encounter: Secondary | ICD-10-CM | POA: Insufficient documentation

## 2021-12-30 DIAGNOSIS — M545 Low back pain, unspecified: Secondary | ICD-10-CM | POA: Insufficient documentation

## 2021-12-30 DIAGNOSIS — Y92009 Unspecified place in unspecified non-institutional (private) residence as the place of occurrence of the external cause: Secondary | ICD-10-CM | POA: Insufficient documentation

## 2021-12-30 DIAGNOSIS — G8929 Other chronic pain: Secondary | ICD-10-CM | POA: Insufficient documentation

## 2021-12-30 DIAGNOSIS — Z7902 Long term (current) use of antithrombotics/antiplatelets: Secondary | ICD-10-CM | POA: Diagnosis not present

## 2021-12-30 LAB — CBC WITH DIFFERENTIAL/PLATELET
Abs Immature Granulocytes: 0.01 10*3/uL (ref 0.00–0.07)
Basophils Absolute: 0 10*3/uL (ref 0.0–0.1)
Basophils Relative: 0 %
Eosinophils Absolute: 0.1 10*3/uL (ref 0.0–0.5)
Eosinophils Relative: 1 %
HCT: 38.6 % — ABNORMAL LOW (ref 39.0–52.0)
Hemoglobin: 12.6 g/dL — ABNORMAL LOW (ref 13.0–17.0)
Immature Granulocytes: 0 %
Lymphocytes Relative: 15 %
Lymphs Abs: 0.9 10*3/uL (ref 0.7–4.0)
MCH: 33.2 pg (ref 26.0–34.0)
MCHC: 32.6 g/dL (ref 30.0–36.0)
MCV: 101.8 fL — ABNORMAL HIGH (ref 80.0–100.0)
Monocytes Absolute: 0.8 10*3/uL (ref 0.1–1.0)
Monocytes Relative: 13 %
Neutro Abs: 4.2 10*3/uL (ref 1.7–7.7)
Neutrophils Relative %: 71 %
Platelets: 49 10*3/uL — ABNORMAL LOW (ref 150–400)
RBC: 3.79 MIL/uL — ABNORMAL LOW (ref 4.22–5.81)
RDW: 15.3 % (ref 11.5–15.5)
WBC: 5.9 10*3/uL (ref 4.0–10.5)
nRBC: 0 % (ref 0.0–0.2)

## 2021-12-30 LAB — BASIC METABOLIC PANEL
Anion gap: 6 (ref 5–15)
BUN: 17 mg/dL (ref 8–23)
CO2: 27 mmol/L (ref 22–32)
Calcium: 9.3 mg/dL (ref 8.9–10.3)
Chloride: 107 mmol/L (ref 98–111)
Creatinine, Ser: 0.99 mg/dL (ref 0.61–1.24)
GFR, Estimated: >60 mL/min (ref >60)
Glucose, Bld: 162 mg/dL — ABNORMAL HIGH (ref 70–99)
Potassium: 4.3 mmol/L (ref 3.5–5.1)
Sodium: 140 mmol/L (ref 135–145)

## 2021-12-30 MED ORDER — OXYCODONE HCL 5 MG PO TABS
10.0000 mg | ORAL_TABLET | Freq: Once | ORAL | Status: AC
  Administered 2021-12-30: 10 mg via ORAL
  Filled 2021-12-30: qty 2

## 2021-12-30 MED ORDER — OXYCODONE HCL 5 MG PO TABS: 5.0000 mg | ORAL_TABLET | Freq: Three times a day (TID) | ORAL | 0 refills | Status: DC | PRN

## 2021-12-30 NOTE — ED Provider Triage Note (Signed)
Emergency Medicine Provider Triage Evaluation Note  Barry [name] , a 66 y.o. male with PMHx of CAD s/p CABG, DM, HTN, DDD, and history of generalized weakness was evaluated in triage.  Pt complains of fall tonight. Has chronic back pain that intermittently is severe and shooting. Chronic back pain caused patient to slide of his bed and onto the floor. States his chest is a bit sore from his fall. No LOC. Reports some baseline urgency incontinence which is unchanged. Moved back to [place] recently.  Review of Systems  Positive: As above Negative: As above  Physical Exam  BP (!) 141/83 (BP Location: Right Arm)   Pulse 87   Temp 99.6 F (37.6 C)   Resp 16   SpO2 99%  Gen:   Awake, no distress  Resp:  Normal effort  MSK:   Moves extremities without difficulty  Other:  Sensation to light touch intact and equal in BLE. Preserved strength against resistance when testing hip abduction/adduction, knee flexion/extension, dorsiflexion, plantar flexion.  Medical Decision Making  Medically screening exam initiated at 12:25 AM.  Appropriate orders placed.  Burton Gahan Beckstead was informed that the remainder of the evaluation will be completed by another provider, this initial triage assessment does not replace that evaluation, and the importance of remaining in the ED until their evaluation is complete.  Acute exacerbation of chronic low back pain - neurovascularly intact on exam. No red flags or signs concerning for cauda equina.  On chart review, mobility appears to be a baseline issue for patient - reference ED visit on 05/10/2020.   Antony Madura, PA-C 12/30/21 0031

## 2021-12-30 NOTE — ED Notes (Signed)
Pt verbalized understanding of d/c instructions, prescription and follow up care. Pt reports his friend will be driving him home.

## 2021-12-30 NOTE — ED Triage Notes (Signed)
Pt arrived by EMS from home. Pt slide out of his bed due to increased shouting pain up down his right back down to his toes.   No LOC and no dizziness

## 2021-12-30 NOTE — Discharge Instructions (Addendum)
Please follow-up with your back specialist in the clinic.

## 2021-12-30 NOTE — ED Provider Notes (Addendum)
[facility] EMERGENCY DEPARTMENT Provider Note   CSN: [id number] Arrival date & time: 12/30/21  0013     History  Chief Complaint  Patient presents with   Fall   Back Pain    Barry [name] is a 66 y.o. male with chronic back pain, recurring falls, degenerative disc disease, presenting with a fall at home yesterday.  He reports that his pain has been intolerable since he returned from living with a family member in [place].  He has a follow-up appointment this month with both his orthopedic and spine specialist.  He has a prescription for gabapentin waiting at the pharmacy tomorrow but reports "this is never help me".  He says that he fell out of bed a low impact fall with shooting pains up his back yesterday.  Difficulty walking.  He uses a walker and cane at baseline due to pain.  The pain is 10 out of 10 in severity.  He says he cannot sleep.  HPI     Home Medications Prior to Admission medications   Medication Sig Start Date End Date Taking? Authorizing Provider  oxyCODONE (ROXICODONE) 5 MG immediate release tablet Take 1 tablet (5 mg total) by mouth every 8 (eight) hours as needed for up to 12 doses for severe pain. 12/30/21  Yes , Carola Rhine, MD  acetaminophen (TYLENOL) 500 MG tablet Take 1,000 mg by mouth every 8 (eight) hours as needed for moderate pain.     [provider]  albuterol (PROVENTIL HFA;VENTOLIN HFA) 108 (90 Base) MCG/ACT inhaler Inhale 2 puffs into the lungs every 6 (six) hours as needed for wheezing or shortness of breath. 04/21/17   Gildardo Cranker, DO  aspirin 81 MG chewable tablet Chew 81 mg by mouth daily.     [provider]  Blood Glucose Monitoring Suppl (CONTOUR NEXT EZ MONITOR) w/Device KIT Test blood sugar three times daily E11.22 03/08/16   Tivis Ringer, RPH-CPP  busPIRone (BUSPAR) 15 MG tablet TAKE 1 TABLET BY MOUTH THREE TIMES DAILY FOR ANXIETY 07/19/17   Gildardo Cranker, DO  clopidogrel (PLAVIX) 75 MG tablet  APPOINTMENT OVERDUE Take 1 by mouth daily 10/31/19   [name], NP  cyclobenzaprine (FLEXERIL) 10 MG tablet Take 10 mg by mouth 2 (two) times daily as needed. 07/23/19   [provider]  gabapentin (NEURONTIN) 300 MG capsule Take 600 mg by mouth 3 (three) times daily.  09/12/19   [provider]  glipiZIDE (GLUCOTROL) 5 MG tablet Take 5 mg by mouth daily. 07/10/19   [provider]  glucose blood (BAYER CONTOUR NEXT TEST) test strip Use as instructed 03/08/16   Tivis Ringer, RPH-CPP  HYDROcodone-acetaminophen (NORCO/VICODIN) 5-325 MG tablet Take 1 tablet by mouth 2 (two) times daily as needed. 01/24/20   [provider]  Insulin Pen Needle 32G X 4 MM MISC Use as Directed. Dx: E11.40 05/18/17   Gildardo Cranker, DO  lisinopril (PRINIVIL,ZESTRIL) 5 MG tablet TAKE 1 TABLET(5 MG) BY MOUTH DAILY 10/17/17   Gildardo Cranker, DO  meloxicam (MOBIC) 15 MG tablet Take 15 mg by mouth daily. 08/09/19   [provider]  metFORMIN (GLUCOPHAGE) 1000 MG tablet TAKE 1 TABLET(1000 MG) BY MOUTH TWICE DAILY 09/09/17   Gildardo Cranker, DO  metoprolol tartrate (LOPRESSOR) 25 MG tablet Take 0.5 tablets (12.5 mg total) by mouth 2 (two) times daily. 05/18/17   Gildardo Cranker, DO  pantoprazole (PROTONIX) 40 MG tablet TAKE 1 TABLET(40 MG) BY MOUTH DAILY 12/10/19  Volanda Napoleon, MD  rosuvastatin (CRESTOR) 40 MG tablet Take 1 tablet (40 mg total) by mouth daily. 12/25/18   [name], NP  Semaglutide,0.25 or 0.5MG/DOS, 2 MG/1.5ML SOPN Inject 0.25 mg into the skin once a week. 01/16/20   [provider]  sertraline (ZOLOFT) 100 MG tablet TAKE 2 TABLETS(200 MG) BY MOUTH DAILY 04/20/19   [name], NP  temazepam (RESTORIL) 15 MG capsule Take 1 capsule (15 mg total) by mouth at bedtime as needed for sleep. 01/10/20   Volanda Napoleon, MD  thiamine (VITAMIN B-1) 100 MG tablet Take 100 mg by mouth daily.    [provider]  TOUJEO SOLOSTAR 300 UNIT/ML  Solostar Pen INJECT 60 UNITS INTO THE SKIN DAILY 06/16/20   Volanda Napoleon, MD  traMADol (ULTRAM) 50 MG tablet Take 50 mg by mouth 2 (two) times daily as needed. 11/01/19   [provider]      Allergies    Patient has no known allergies.    Review of Systems   Review of Systems  Physical Exam Updated Vital Signs BP (!) 120/108 (BP Location: Right Arm)   Pulse 70   Temp 98.1 F (36.7 C) (Oral)   Resp 20   Ht 5' 6" (1.676 m)   Wt 115 kg   SpO2 100%   BMI 40.92 kg/m  Physical Exam Constitutional:      General: He is not in acute distress. HENT:     Head: Normocephalic and atraumatic.  Eyes:     Conjunctiva/sclera: Conjunctivae normal.     Pupils: Pupils are equal, round, and reactive to light.  Cardiovascular:     Rate and Rhythm: Normal rate and regular rhythm.  Pulmonary:     Effort: Pulmonary effort is normal. No respiratory distress.  Abdominal:     General: There is no distension.     Tenderness: There is no abdominal tenderness.  Skin:    General: Skin is warm and dry.  Neurological:     General: No focal deficit present.     Mental Status: He is alert. Mental status is at baseline.     Sensory: No sensory deficit.     Motor: No weakness.  Psychiatric:        Mood and Affect: Mood normal.        Behavior: Behavior normal.     ED Results / Procedures / Treatments   Labs (all labs ordered are listed, but only abnormal results are displayed) Labs Reviewed  CBC WITH DIFFERENTIAL/PLATELET - Abnormal; Notable for the following components:      Result Value   RBC 3.79 (*)    Hemoglobin 12.6 (*)    HCT 38.6 (*)    MCV 101.8 (*)    Platelets 49 (*)    All other components within normal limits  BASIC METABOLIC PANEL - Abnormal; Notable for the following components:   Glucose, Bld 162 (*)    All other components within normal limits    EKG None  Radiology DG Chest 2 View  Result Date: 12/30/2021 CLINICAL DATA:  Fall EXAM: CHEST - 2 VIEW  COMPARISON:  05/20/2020 FINDINGS: Prior CABG. Heart and mediastinal contours are within normal limits. No focal opacities or effusions. No acute bony abnormality. IMPRESSION: No active cardiopulmonary disease. Electronically Signed   By: Rolm Baptise M.D.   On: 12/30/2021 01:07   DG Lumbar Spine Complete  Result Date: 12/30/2021 CLINICAL DATA:  Fall, back and hip pain EXAM: LUMBAR  SPINE - COMPLETE 4+ VIEW COMPARISON:  04/01/2020 FINDINGS: Levoscoliosis in the lumbar spine. Diffuse degenerative disc and facet disease. No fracture. SI joints symmetric and unremarkable. Advanced degenerative changes in the right hip. IMPRESSION: Scoliosis and degenerative changes in the lumbar spine. No acute bony abnormality. Advanced degenerative changes in the right hip. Electronically Signed   By: Rolm Baptise M.D.   On: 12/30/2021 01:07    Procedures Procedures    Medications Ordered in ED Medications  oxyCODONE (Oxy IR/ROXICODONE) immediate release tablet 10 mg (10 mg Oral Given 12/30/21 1445)    ED Course/ Medical Decision Making/ A&P                           Medical Decision Making Risk Prescription drug management.   Patient is here with severe intractable lower back pain radiating into the legs.  Consistent with lumbar radiculopathy or sciatica.  Pain medication has been ordered.  X-rays and blood test ordered from triage last night, history of some question of chest pressure, but this is not consistent with my history and I have a low suspicion for ACS or intrathoracic pathology.  I personally reviewed interpreted the patient's labs and work-up and EKG which are unremarkable per my interpretation.  No emergent findings.  Denies any indication for MRI and doubt cauda equina syndrome.  I think this is a matter of pain control, we can prescribe him a short-term pain prescription medication till he can follow-up with a specialist.  We did discuss the risks of these medication including dependency and  addiction, and he understands these risks, but is seeking for some relief as he has almost no quality of life at this point due to pain and lack of sleep.   In the ED the patient was able to ambulate on his own to the bathroom.  Pain was significantly improved on reassessment after oxycodone.     Final Clinical Impression(s) / ED Diagnoses Final diagnoses:  Chronic midline low back pain with bilateral sciatica  Fall, initial encounter    Rx / DC Orders ED Discharge Orders          Ordered    oxyCODONE (ROXICODONE) 5 MG immediate release tablet  Every 8 hours PRN        12/30/21 1510              Wyvonnia Dusky, MD 12/30/21 1510    Wyvonnia Dusky, MD 12/30/21 1511

## 2022-02-15 ENCOUNTER — Telehealth: Payer: Self-pay

## 2022-02-15 NOTE — Telephone Encounter (Signed)
   Pre-operative Risk Assessment    Patient Name: Barry [name]  DOB: 1955/09/02 MRN: [id number]      Request for Surgical Clearance    Procedure:   Right total hip arthroplasty   Date of Surgery:  Clearance TBD                                 Surgeon:  Dr. Rod Can Surgeon's Group or Practice Name:  [facility] Phone number:  [phone] Fax number:  [phone]   Type of Clearance Requested:   - Medical  - Pharmacy:  Hold Aspirin     Type of Anesthesia:  Spinal   Additional requests/questions:      Overton Mam   02/15/2022, 4:37 PM

## 2022-02-16 NOTE — Telephone Encounter (Signed)
   Name: Barry [name]  DOB: July 15, 1955  MRN: [id number]  Primary Cardiologist: None  Chart reviewed as part of pre-operative protocol coverage. Because of Barry [name]'s past medical history and time since last visit, he will require a follow-up in-office visit in order to better assess preoperative cardiovascular risk (pt has not been seen since 2019).  Pre-op covering staff: - Please schedule appointment and call patient to inform them. If patient already had an upcoming appointment within acceptable timeframe, please add "pre-op clearance" to the appointment notes so provider is aware. - Please contact requesting surgeon's office via preferred method (i.e, phone, fax) to inform them of need for appointment prior to surgery.  Lenna Sciara, NP  02/16/2022, 11:45 AM

## 2022-02-17 NOTE — Telephone Encounter (Signed)
02/17/22 3:36pm LVM to schedule NP appt for preop clearance

## 2022-02-17 NOTE — Telephone Encounter (Signed)
Pt needs new pt appt for pre op clearance. I will send message to chart prep and scheduling.

## 2022-02-19 NOTE — Telephone Encounter (Signed)
Patient has been scheduled for 03/09/22 at 1:45pm with Dr. Gwenlyn Found.

## 2022-02-19 NOTE — Telephone Encounter (Signed)
Pt has been scheduled to see Dr. Gwenlyn Found 03/09/22 for new pt pre op appt. I will update all parties involved in pre op care.

## 2022-03-01 ENCOUNTER — Ambulatory Visit (INDEPENDENT_AMBULATORY_CARE_PROVIDER_SITE_OTHER): Payer: Medicare HMO | Admitting: Internal Medicine

## 2022-03-01 ENCOUNTER — Encounter: Payer: Self-pay | Admitting: Internal Medicine

## 2022-03-01 VITALS — BP 134/78 | HR 95 | Temp 98.2°F | Resp 16 | Ht 66.0 in | Wt 231.2 lb

## 2022-03-01 DIAGNOSIS — E1140 Type 2 diabetes mellitus with diabetic neuropathy, unspecified: Secondary | ICD-10-CM | POA: Diagnosis not present

## 2022-03-01 DIAGNOSIS — Z794 Long term (current) use of insulin: Secondary | ICD-10-CM

## 2022-03-01 DIAGNOSIS — E1129 Type 2 diabetes mellitus with other diabetic kidney complication: Secondary | ICD-10-CM

## 2022-03-01 DIAGNOSIS — L219 Seborrheic dermatitis, unspecified: Secondary | ICD-10-CM

## 2022-03-01 DIAGNOSIS — E519 Thiamine deficiency, unspecified: Secondary | ICD-10-CM

## 2022-03-01 DIAGNOSIS — I10 Essential (primary) hypertension: Secondary | ICD-10-CM | POA: Diagnosis not present

## 2022-03-01 DIAGNOSIS — E785 Hyperlipidemia, unspecified: Secondary | ICD-10-CM | POA: Diagnosis not present

## 2022-03-01 DIAGNOSIS — D539 Nutritional anemia, unspecified: Secondary | ICD-10-CM | POA: Diagnosis not present

## 2022-03-01 DIAGNOSIS — R809 Proteinuria, unspecified: Secondary | ICD-10-CM

## 2022-03-01 DIAGNOSIS — D696 Thrombocytopenia, unspecified: Secondary | ICD-10-CM

## 2022-03-01 DIAGNOSIS — R188 Other ascites: Secondary | ICD-10-CM | POA: Insufficient documentation

## 2022-03-01 DIAGNOSIS — K7460 Unspecified cirrhosis of liver: Secondary | ICD-10-CM | POA: Diagnosis not present

## 2022-03-01 DIAGNOSIS — R21 Rash and other nonspecific skin eruption: Secondary | ICD-10-CM

## 2022-03-01 LAB — BASIC METABOLIC PANEL
BUN: 24 mg/dL — ABNORMAL HIGH (ref 6–23)
CO2: 25 mEq/L (ref 19–32)
Calcium: 9.2 mg/dL (ref 8.4–10.5)
Chloride: 103 mEq/L (ref 96–112)
Creatinine, Ser: 0.81 mg/dL (ref 0.40–1.50)
GFR: 92.96 mL/min (ref >60.00)
Glucose, Bld: 140 mg/dL — ABNORMAL HIGH (ref 70–99)
Potassium: 4.4 mEq/L (ref 3.5–5.1)
Sodium: 135 mEq/L (ref 135–145)

## 2022-03-01 LAB — TSH: TSH: 1.15 u[IU]/mL (ref 0.35–5.50)

## 2022-03-01 LAB — HEPATIC FUNCTION PANEL
ALT: 59 U/L — ABNORMAL HIGH (ref 0–53)
AST: 63 U/L — ABNORMAL HIGH (ref 0–37)
Albumin: 3.7 g/dL (ref 3.5–5.2)
Alkaline Phosphatase: 116 U/L (ref 39–117)
Bilirubin, Direct: 0.2 mg/dL (ref 0.0–0.3)
Total Bilirubin: 0.7 mg/dL (ref 0.2–1.2)
Total Protein: 7.4 g/dL (ref 6.0–8.3)

## 2022-03-01 LAB — CBC WITH DIFFERENTIAL/PLATELET
Basophils Absolute: 0 10*3/uL (ref 0.0–0.1)
Basophils Relative: 0.7 % (ref 0.0–3.0)
Eosinophils Absolute: 0.1 10*3/uL (ref 0.0–0.7)
Eosinophils Relative: 1.4 % (ref 0.0–5.0)
HCT: 39.1 % (ref 39.0–52.0)
Hemoglobin: 13.1 g/dL (ref 13.0–17.0)
Lymphocytes Relative: 24.7 % (ref 12.0–46.0)
Lymphs Abs: 1.1 10*3/uL (ref 0.7–4.0)
MCHC: 33.5 g/dL (ref 30.0–36.0)
MCV: 99.8 fl (ref 78.0–100.0)
Monocytes Absolute: 0.5 10*3/uL (ref 0.1–1.0)
Monocytes Relative: 11 % (ref 3.0–12.0)
Neutro Abs: 2.8 10*3/uL (ref 1.4–7.7)
Neutrophils Relative %: 62.2 % (ref 43.0–77.0)
Platelets: 72 10*3/uL — ABNORMAL LOW (ref 150.0–400.0)
RBC: 3.92 Mil/uL — ABNORMAL LOW (ref 4.22–5.81)
RDW: 16.4 % — ABNORMAL HIGH (ref 11.5–15.5)
WBC: 4.5 10*3/uL (ref 4.0–10.5)

## 2022-03-01 LAB — URINALYSIS, ROUTINE W REFLEX MICROSCOPIC
Bilirubin Urine: NEGATIVE
Hgb urine dipstick: NEGATIVE
Leukocytes,Ua: NEGATIVE
Nitrite: NEGATIVE
Specific Gravity, Urine: 1.015 (ref 1.000–1.030)
Total Protein, Urine: NEGATIVE
Urine Glucose: NEGATIVE
Urobilinogen, UA: 2 — AB (ref 0.0–1.0)
pH: 7 (ref 5.0–8.0)

## 2022-03-01 LAB — IBC + FERRITIN
Ferritin: 34.7 ng/mL (ref 22.0–322.0)
Iron: 114 ug/dL (ref 42–165)
Saturation Ratios: 26.3 % (ref 20.0–50.0)
TIBC: 434 ug/dL (ref 250.0–450.0)
Transferrin: 310 mg/dL (ref 212.0–360.0)

## 2022-03-01 LAB — LIPID PANEL
Cholesterol: 113 mg/dL (ref 0–200)
HDL: 45 mg/dL (ref >39.00)
LDL Cholesterol: 50 mg/dL (ref 0–99)
NonHDL: 68.01
Total CHOL/HDL Ratio: 3
Triglycerides: 88 mg/dL (ref 0.0–149.0)
VLDL: 17.6 mg/dL (ref 0.0–40.0)

## 2022-03-01 LAB — PROTIME-INR
INR: 1.1 ratio — ABNORMAL HIGH (ref 0.8–1.0)
Prothrombin Time: 12.5 s (ref 9.6–13.1)

## 2022-03-01 LAB — MICROALBUMIN / CREATININE URINE RATIO
Creatinine,U: 122.3 mg/dL
Microalb Creat Ratio: 3 mg/g (ref 0.0–30.0)
Microalb, Ur: 3.7 mg/dL — ABNORMAL HIGH (ref 0.0–1.9)

## 2022-03-01 LAB — HEMOGLOBIN A1C: Hgb A1c MFr Bld: 6.7 % — ABNORMAL HIGH (ref 4.6–6.5)

## 2022-03-01 LAB — VITAMIN B12: Vitamin B-12: 805 pg/mL (ref 211–911)

## 2022-03-01 LAB — FOLATE: Folate: 13.6 ng/mL (ref >5.9)

## 2022-03-01 MED ORDER — CICLOPIROX OLAMINE 0.77 % EX CREA: TOPICAL_CREAM | Freq: Two times a day (BID) | CUTANEOUS | 2 refills | Status: DC

## 2022-03-01 NOTE — Patient Instructions (Signed)
Health Maintenance, Male Adopting a healthy lifestyle and getting preventive care are important in promoting health and wellness. Ask your health care provider about: The right schedule for you to have regular tests and exams. Things you can do on your own to prevent diseases and keep yourself healthy. What should I know about diet, weight, and exercise? Eat a healthy diet  Eat a diet that includes plenty of vegetables, fruits, low-fat dairy products, and lean protein. Do not eat a lot of foods that are high in solid fats, added sugars, or sodium. Maintain a healthy weight Body mass index (BMI) is a measurement that can be used to identify possible weight problems. It estimates body fat based on height and weight. Your health care provider can help determine your BMI and help you achieve or maintain a healthy weight. Get regular exercise Get regular exercise. This is one of the most important things you can do for your health. Most adults should: Exercise for at least 150 minutes each week. The exercise should increase your heart rate and make you sweat (moderate-intensity exercise). Do strengthening exercises at least twice a week. This is in addition to the moderate-intensity exercise. Spend less time sitting. Even light physical activity can be beneficial. Watch cholesterol and blood lipids Have your blood tested for lipids and cholesterol at 65 years of age, then have this test every 5 years. You may need to have your cholesterol levels checked more often if: Your lipid or cholesterol levels are high. You are older than 65 years of age. You are at high risk for heart disease. What should I know about cancer screening? Many types of cancers can be detected early and may often be prevented. Depending on your health history and family history, you may need to have cancer screening at various ages. This may include screening for: Colorectal cancer. Prostate cancer. Skin cancer. Lung  cancer. What should I know about heart disease, diabetes, and high blood pressure? Blood pressure and heart disease High blood pressure causes heart disease and increases the risk of stroke. This is more likely to develop in people who have high blood pressure readings or are overweight. Talk with your health care provider about your target blood pressure readings. Have your blood pressure checked: Every 3-5 years if you are 18-39 years of age. Every year if you are 40 years old or older. If you are between the ages of 65 and 75 and are a current or former smoker, ask your health care provider if you should have a one-time screening for abdominal aortic aneurysm (AAA). Diabetes Have regular diabetes screenings. This checks your fasting blood sugar level. Have the screening done: Once every three years after age 45 if you are at a normal weight and have a low risk for diabetes. More often and at a younger age if you are overweight or have a high risk for diabetes. What should I know about preventing infection? Hepatitis B If you have a higher risk for hepatitis B, you should be screened for this virus. Talk with your health care provider to find out if you are at risk for hepatitis B infection. Hepatitis C Blood testing is recommended for: Everyone born from 1945 through 1965. Anyone with known risk factors for hepatitis C. Sexually transmitted infections (STIs) You should be screened each year for STIs, including gonorrhea and chlamydia, if: You are sexually active and are younger than 65 years of age. You are older than 65 years of age and your   health care provider tells you that you are at risk for this type of infection. Your sexual activity has changed since you were last screened, and you are at increased risk for chlamydia or gonorrhea. Ask your health care provider if you are at risk. Ask your health care provider about whether you are at high risk for HIV. Your health care provider  may recommend a prescription medicine to help prevent HIV infection. If you choose to take medicine to prevent HIV, you should first get tested for HIV. You should then be tested every 3 months for as long as you are taking the medicine. Follow these instructions at home: Alcohol use Do not drink alcohol if your health care provider tells you not to drink. If you drink alcohol: Limit how much you have to 0-2 drinks a day. Know how much alcohol is in your drink. In the [place] one drink equals one 12 oz bottle of beer (355 mL), one 5 oz glass of wine (148 mL), or one 1 oz glass of hard liquor (44 mL). Lifestyle Do not use any products that contain nicotine or tobacco. These products include cigarettes, chewing tobacco, and vaping devices, such as e-cigarettes. If you need help quitting, ask your health care provider. Do not use street drugs. Do not share needles. Ask your health care provider for help if you need support or information about quitting drugs. General instructions Schedule regular health, dental, and eye exams. Stay current with your vaccines. Tell your health care provider if: You often feel depressed. You have ever been abused or do not feel safe at home. Summary Adopting a healthy lifestyle and getting preventive care are important in promoting health and wellness. Follow your health care provider's instructions about healthy diet, exercising, and getting tested or screened for diseases. Follow your health care provider's instructions on monitoring your cholesterol and blood pressure. This information is not intended to replace advice given to you by your health care provider. Make sure you discuss any questions you have with your health care provider. Document Revised: 06/02/2020 Document Reviewed: 06/02/2020 Elsevier Patient Education  2023 [employer]  

## 2022-03-01 NOTE — Progress Notes (Signed)
Subjective:  Patient ID: Barry [name], male    DOB: [birth year]  Age: 67 y.o. MRN: [id number]  CC: Hypertension, Diabetes, Hyperlipidemia, Coronary Artery Disease, and Rash   HPI Barry [name] presents to establish.  He recently moved back from [place].  He tells me that he has a history of cirrhosis and underwent upper and lower endoscopy 5 months ago.  He is active and denies chest pain, shortness of breath, diaphoresis, or edema.  He has upcoming orthopedic surgeon and needs a form completed from a primary care doctor.   History Barry [name] has a past medical history of Coronary artery disease, Family history of colon cancer (10/15/2019), GERD (gastroesophageal reflux disease), Hyperlipidemia, MI (myocardial infarction) ([place]) (04/23/2007), Morbid obesity ([place]) (06/26/2012), S/P CABG x 3 (07/04/2012), Sleep apnea, Type II or unspecified type diabetes mellitus without mention of complication, not stated as uncontrolled, and Unspecified essential hypertension.   He has a past surgical history that includes Coronary angioplasty with stent (04/23/2007); Coronary artery bypass graft (N/A, 07/04/2012); Intraoprative transesophageal echocardiogram (N/A, 07/04/2012); left heart catheterization with coronary angiogram (N/A, 06/25/2012); LEFT HEART CATH AND CORS/GRAFTS ANGIOGRAPHY (N/A, 04/18/2017); and Tooth extraction (04/2018).   His family history includes Cancer in his father, maternal aunt, mother, and sister; Colon cancer in his brother and brother; Diabetes in his brother and sister.He reports that he has never smoked. He has never used smokeless tobacco. He reports that he does not drink alcohol and does not use drugs.  Outpatient Medications Prior to Visit  Medication Sig Dispense Refill   acetaminophen (TYLENOL) 500 MG tablet Take 1,000 mg by mouth every 8 (eight) hours as needed for moderate pain.      albuterol (PROVENTIL HFA;VENTOLIN HFA) 108 (90 Base) MCG/ACT inhaler Inhale 2 puffs into  the lungs every 6 (six) hours as needed for wheezing or shortness of breath. 1 Inhaler 3   aspirin 81 MG chewable tablet Chew 81 mg by mouth daily.      atorvastatin (LIPITOR) 80 MG tablet Take 80 mg by mouth.     Blood Glucose Monitoring Suppl (CONTOUR NEXT EZ MONITOR) w/Device KIT Test blood sugar three times daily E11.22 1 kit 12   busPIRone (BUSPAR) 15 MG tablet TAKE 1 TABLET BY MOUTH THREE TIMES DAILY FOR ANXIETY 90 tablet 3   clopidogrel (PLAVIX) 75 MG tablet APPOINTMENT OVERDUE Take 1 by mouth daily 15 tablet 0   FEROSUL 325 (65 Fe) MG tablet Take 325 mg by mouth daily.     SYMBICORT 80-4.5 MCG/ACT inhaler Inhale into the lungs.     cyclobenzaprine (FLEXERIL) 10 MG tablet Take 10 mg by mouth 2 (two) times daily as needed.     gabapentin (NEURONTIN) 300 MG capsule Take 600 mg by mouth 3 (three) times daily.      glucose blood (BAYER CONTOUR NEXT TEST) test strip Use as instructed 100 each 12   Insulin Pen Needle 32G X 4 MM MISC Use as Directed. Dx: E11.40 100 each 11   metFORMIN (GLUCOPHAGE) 1000 MG tablet TAKE 1 TABLET(1000 MG) BY MOUTH TWICE DAILY 180 tablet 1   metoprolol tartrate (LOPRESSOR) 25 MG tablet Take 0.5 tablets (12.5 mg total) by mouth 2 (two) times daily. 90 tablet 1   pantoprazole (PROTONIX) 40 MG tablet TAKE 1 TABLET(40 MG) BY MOUTH DAILY 30 tablet 5   Semaglutide,0.25 or 0.5MG/DOS, 2 MG/1.5ML SOPN Inject 0.25 mg into the skin once a week.  sertraline (ZOLOFT) 100 MG tablet TAKE 2 TABLETS(200 MG) BY MOUTH DAILY 60 tablet 5   TOUJEO SOLOSTAR 300 UNIT/ML Solostar Pen INJECT 60 UNITS INTO THE SKIN DAILY 90 mL 0   glipiZIDE (GLUCOTROL) 5 MG tablet Take 5 mg by mouth daily.     HYDROcodone-acetaminophen (NORCO/VICODIN) 5-325 MG tablet Take 1 tablet by mouth 2 (two) times daily as needed.     lisinopril (PRINIVIL,ZESTRIL) 5 MG tablet TAKE 1 TABLET(5 MG) BY MOUTH DAILY 90 tablet 2   meloxicam (MOBIC) 15 MG tablet Take 15 mg by mouth daily.     oxyCODONE (ROXICODONE) 5 MG  immediate release tablet Take 1 tablet (5 mg total) by mouth every 8 (eight) hours as needed for up to 12 doses for severe pain. 12 tablet 0   rosuvastatin (CRESTOR) 40 MG tablet Take 1 tablet (40 mg total) by mouth daily. 90 tablet 3   temazepam (RESTORIL) 15 MG capsule Take 1 capsule (15 mg total) by mouth at bedtime as needed for sleep. 30 capsule 0   thiamine (VITAMIN B-1) 100 MG tablet Take 100 mg by mouth daily.     traMADol (ULTRAM) 50 MG tablet Take 50 mg by mouth 2 (two) times daily as needed.     No facility-administered medications prior to visit.    ROS Review of Systems  Constitutional:  Negative for appetite change, chills, diaphoresis, fatigue and fever.  HENT: Negative.  Negative for trouble swallowing.   Eyes:  Negative for discharge.  Respiratory:  Positive for apnea. Negative for cough, shortness of breath and wheezing.   Cardiovascular:  Negative for chest pain, palpitations and leg swelling.  Gastrointestinal:  Negative for abdominal pain, constipation, diarrhea, nausea and vomiting.  Endocrine: Negative for cold intolerance.  Genitourinary: Negative.  Negative for difficulty urinating and dysuria.  Musculoskeletal:  Positive for arthralgias. Negative for myalgias.  Skin:  Positive for rash.  Neurological: Negative.  Negative for dizziness, weakness and light-headedness.  Hematological:  Negative for adenopathy. Does not bruise/bleed easily.  Psychiatric/Behavioral: Negative.      Objective:  BP 134/78 (BP Location: Left Arm, Patient Position: Sitting, Cuff Size: Normal)   Pulse 95   Temp 98.2 F (36.8 C) (Oral)   Resp 16   Ht 5' 6"$  (1.676 m)   Wt 231 lb 3.2 oz (104.9 kg)   SpO2 94%   BMI 37.32 kg/m   Physical Exam Vitals reviewed.  Constitutional:      Appearance: He is obese. He is ill-appearing.  HENT:     Mouth/Throat:     Mouth: Mucous membranes are moist.  Eyes:     General: No scleral icterus.    Conjunctiva/sclera: Conjunctivae normal.   Cardiovascular:     Rate and Rhythm: Normal rate and regular rhythm.     Pulses: Normal pulses.     Heart sounds: No murmur heard.    No gallop.  Pulmonary:     Effort: Pulmonary effort is normal.     Breath sounds: No stridor. No wheezing, rhonchi or rales.  Abdominal:     General: Abdomen is flat.     Palpations: There is no mass.     Tenderness: There is no abdominal tenderness. There is no guarding.     Hernia: No hernia is present.  Musculoskeletal:        General: Normal range of motion.     Cervical back: Neck supple.  Lymphadenopathy:     Cervical: No cervical adenopathy.  Skin:    General:  Skin is warm and dry.     Findings: Erythema and rash present.     Comments: Over the forehead and around the eyebrows there is an erythematous rash with waxy, greasy scale.   Over the abdomen there are areas of coalesced papules.  Some are erythematous and fleshy and some are merely hyperpigmented.  This looks like necrobiosis lipoidica.  Neurological:     General: No focal deficit present.     Mental Status: He is alert. Mental status is at baseline.  Psychiatric:        Mood and Affect: Mood normal.        Behavior: Behavior normal.      Lab Results  Component Value Date   WBC 4.5 03/01/2022   HGB 13.1 03/01/2022   HCT 39.1 03/01/2022   PLT 72.0 (L) 03/01/2022   GLUCOSE 140 (H) 03/01/2022   CHOL 113 03/01/2022   TRIG 88.0 03/01/2022   HDL 45.00 03/01/2022   LDLCALC 50 03/01/2022   ALT 59 (H) 03/01/2022   AST 63 (H) 03/01/2022   NA 135 03/01/2022   K 4.4 03/01/2022   CL 103 03/01/2022   CREATININE 0.81 03/01/2022   BUN 24 (H) 03/01/2022   CO2 25 03/01/2022   TSH 1.15 03/01/2022   PSA 0.4 12/04/2015   INR 1.1 (H) 03/01/2022   HGBA1C 6.7 (H) 03/01/2022   MICROALBUR 3.7 (H) 03/01/2022      Assessment & Plan:   Barry [name] was seen today for hypertension, diabetes, hyperlipidemia, coronary artery disease and rash.  Diagnoses and all orders for this  visit:  Essential hypertension, benign- His blood pressure is adequately well-controlled. -     TSH; Future -     Urinalysis, Routine w reflex microscopic; Future -     Basic metabolic panel; Future -     Basic metabolic panel -     Urinalysis, Routine w reflex microscopic -     TSH -     lisinopril (ZESTRIL) 5 MG tablet; TAKE 1 TABLET(5 MG) BY MOUTH DAILY  Microalbuminuria due to type 2 diabetes mellitus ([place])- Will continue the ACE inhibitor. -     lisinopril (ZESTRIL) 5 MG tablet; TAKE 1 TABLET(5 MG) BY MOUTH DAILY  Type 2 diabetes mellitus with diabetic neuropathy, with long-term current use of insulin ([place])- His blood sugar is adequately well-controlled. -     Hemoglobin A1c; Future -     Basic metabolic panel; Future -     Microalbumin / creatinine urine ratio; Future -     Microalbumin / creatinine urine ratio -     Basic metabolic panel -     Hemoglobin A1c -     Ambulatory referral to Ophthalmology -     HM Diabetes Foot Exam  Thrombocytopenia ([place])- I will treat the thiamine deficiency. -     CBC with Differential/Platelet; Future -     Vitamin B12; Future -     Folate; Future -     Folate -     Vitamin B12 -     CBC with Differential/Platelet  Cirrhosis of liver without ascites, unspecified hepatic cirrhosis type ([place])- His MELD score is reassuring. -     Protime-INR; Future -     Hepatic function panel; Future -     Zinc; Future -     Vitamin B1; Future -     Vitamin B1 -     Zinc -     Hepatic function panel -  Protime-INR  Deficiency anemia- H&H are normal now.  I will evaluate for vitamin deficiencies. -     IBC + Ferritin; Future -     CBC with Differential/Platelet; Future -     Vitamin B12; Future -     Folate; Future -     Zinc; Future -     Vitamin B1; Future -     Vitamin B1 -     Zinc -     Folate -     Vitamin B12 -     CBC with Differential/Platelet -     IBC + Ferritin  Hyperlipidemia LDL goal <70- LDL goal achieved. Doing well on  the statin  -     Lipid panel; Future -     TSH; Future -     Hepatic function panel; Future -     Hepatic function panel -     TSH -     Lipid panel  Chronic seborrheic dermatitis -     ciclopirox (LOPROX) 0.77 % cream; Apply topically 2 (two) times daily.  Rash and nonspecific skin eruption -     Ambulatory referral to Dermatology  Manifestations of thiamine deficiency -     thiamine (VITAMIN B-1) 100 MG tablet; Take 1 tablet (100 mg total) by mouth daily.   I have discontinued Charleen Kirks. Gavel's rosuvastatin, glipiZIDE, meloxicam, traMADol, temazepam, HYDROcodone-acetaminophen, and oxyCODONE. I have also changed his thiamine and lisinopril. Additionally, I am having him start on ciclopirox. Lastly, I am having him maintain his aspirin, CONTOUR NEXT EZ MONITOR, glucose blood, albuterol, metoprolol tartrate, Insulin Pen Needle, busPIRone, metFORMIN, acetaminophen, sertraline, cyclobenzaprine, gabapentin, clopidogrel, pantoprazole, Semaglutide(0.25 or 0.5MG/DOS), Toujeo SoloStar, atorvastatin, FeroSul, and Symbicort.  Meds ordered this encounter  Medications   ciclopirox (LOPROX) 0.77 % cream    Sig: Apply topically 2 (two) times daily.    Dispense:  90 g    Refill:  2   thiamine (VITAMIN B-1) 100 MG tablet    Sig: Take 1 tablet (100 mg total) by mouth daily.    Dispense:  90 tablet    Refill:  1   lisinopril (ZESTRIL) 5 MG tablet    Sig: TAKE 1 TABLET(5 MG) BY MOUTH DAILY    Dispense:  90 tablet    Refill:  0     Follow-up: Return in about 3 months (around 05/30/2022).  Scarlette Calico, MD

## 2022-03-02 DIAGNOSIS — R21 Rash and other nonspecific skin eruption: Secondary | ICD-10-CM | POA: Insufficient documentation

## 2022-03-05 LAB — VITAMIN B1: Vitamin B1 (Thiamine): 7 nmol/L — ABNORMAL LOW (ref 8–30)

## 2022-03-05 LAB — ZINC: Zinc: 64 ug/dL (ref 60–130)

## 2022-03-06 ENCOUNTER — Encounter: Payer: Self-pay | Admitting: Internal Medicine

## 2022-03-06 DIAGNOSIS — E519 Thiamine deficiency, unspecified: Secondary | ICD-10-CM | POA: Insufficient documentation

## 2022-03-06 MED ORDER — LISINOPRIL 5 MG PO TABS: ORAL_TABLET | ORAL | 0 refills | Status: DC

## 2022-03-06 MED ORDER — VITAMIN B-1 100 MG PO TABS: 100.0000 mg | ORAL_TABLET | Freq: Every day | ORAL | 1 refills | Status: DC

## 2022-03-09 ENCOUNTER — Ambulatory Visit: Payer: Medicare HMO | Attending: Cardiovascular Disease | Admitting: Cardiovascular Disease

## 2022-03-09 ENCOUNTER — Encounter: Payer: Self-pay | Admitting: Cardiovascular Disease

## 2022-03-09 VITALS — BP 122/60 | HR 100 | Ht 66.0 in | Wt 233.0 lb

## 2022-03-09 DIAGNOSIS — Z951 Presence of aortocoronary bypass graft: Secondary | ICD-10-CM

## 2022-03-09 DIAGNOSIS — E785 Hyperlipidemia, unspecified: Secondary | ICD-10-CM

## 2022-03-09 DIAGNOSIS — I10 Essential (primary) hypertension: Secondary | ICD-10-CM

## 2022-03-09 DIAGNOSIS — I25118 Atherosclerotic heart disease of native coronary artery with other forms of angina pectoris: Secondary | ICD-10-CM | POA: Diagnosis not present

## 2022-03-09 NOTE — Progress Notes (Signed)
03/09/2022 Barry [name]   [birth year]  [id number]  Primary Physician Barry Lima, MD Primary Cardiologist: Barry Harp MD Barry [name], [place]  HPI:  Barry [name] is a 67 y.o. 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 He  has a history of CAD s/p MI about 5 years ago  I last saw him in the office/9/19. Marland Kitchen  He underwent coronary artery bypass grafting x3 by Dr. Lilly [name] on 07/04/12 after his DAPT washed out. . A [name] to his LAD, vein to diagonal branch and PDA. He did have a 2-D echo performed 03/29/14 which was entirely normal. He was placed on Integrilin in the interim. He did well on his has recuperated nicely.   since I saw him back in 2016 he has done well until 3 months ago when he developed increasing dyspnea on exertion and some atypical chest pain. He also complains of profound fatigue    He had a  2-D echocardiogram which was normal and a Myoview stress test that showed subtle inferior ischemia. Based on this, he underwent cardiac catheterization by myself as an outpatient on 04/18/17 revealing an occluded vein graft to the RCA, occluded dominant RCA with left-to-right collaterals and normal LV function.I decided to treat him medically.  Since I saw him for he is remained stable.  He apparently does need a right total hip replacement.  He complains of dyspnea and weekly chest pain as well.   Current Meds  Medication Sig   acetaminophen (TYLENOL) 500 MG tablet Take 1,000 mg by mouth every 8 (eight) hours as needed for moderate pain.    albuterol (PROVENTIL HFA;VENTOLIN HFA) 108 (90 Base) MCG/ACT inhaler Inhale 2 puffs into the lungs every 6 (six) hours as needed for wheezing or shortness of breath.   aspirin 81 MG chewable tablet Chew 81 mg by mouth daily.    atorvastatin (LIPITOR) 80 MG tablet Take 80 mg by mouth.   Blood Glucose Monitoring Suppl (CONTOUR NEXT EZ  MONITOR) w/Device KIT Test blood sugar three times daily E11.22   busPIRone (BUSPAR) 15 MG tablet TAKE 1 TABLET BY MOUTH THREE TIMES DAILY FOR ANXIETY   ciclopirox (LOPROX) 0.77 % cream Apply topically 2 (two) times daily.   clopidogrel (PLAVIX) 75 MG tablet APPOINTMENT OVERDUE Take 1 by mouth daily   cyclobenzaprine (FLEXERIL) 10 MG tablet Take 10 mg by mouth 2 (two) times daily as needed.   FEROSUL 325 (65 Fe) MG tablet Take 325 mg by mouth daily.   gabapentin (NEURONTIN) 300 MG capsule Take 600 mg by mouth 3 (three) times daily.    lisinopril (ZESTRIL) 5 MG tablet TAKE 1 TABLET(5 MG) BY MOUTH DAILY   metFORMIN (GLUCOPHAGE) 1000 MG tablet TAKE 1 TABLET(1000 MG) BY MOUTH TWICE DAILY   metoprolol tartrate (LOPRESSOR) 25 MG tablet Take 0.5 tablets (12.5 mg total) by mouth 2 (two) times daily.   pantoprazole (PROTONIX) 40 MG tablet TAKE 1 TABLET(40 MG) BY MOUTH DAILY   sertraline (ZOLOFT) 100 MG tablet TAKE 2 TABLETS(200 MG) BY MOUTH DAILY   SYMBICORT 80-4.5 MCG/ACT inhaler Inhale into the lungs.   thiamine (VITAMIN B-1) 100 MG tablet Take 1 tablet (100 mg total) by mouth daily.   [DISCONTINUED] glucose blood (BAYER CONTOUR NEXT TEST) test strip Use as instructed   [DISCONTINUED] Insulin Pen Needle 32G X 4 MM MISC Use  as Directed. Dx: E11.40   [DISCONTINUED] Semaglutide,0.25 or 0.5MG/DOS, 2 MG/1.5ML SOPN Inject 0.25 mg into the skin once a week.   [DISCONTINUED] TOUJEO SOLOSTAR 300 UNIT/ML Solostar Pen INJECT 60 UNITS INTO THE SKIN DAILY     No Known Allergies  Social History   Socioeconomic History   Marital status: Divorced    Spouse name: Not on file   Number of children: 1   Years of education: Not on file   Highest education level: Not on file  Occupational History   Occupation: MAINTENANCE    Employer: [employer]  Tobacco Use   Smoking status: Never   Smokeless tobacco: Never  Vaping Use   Vaping Use: Never used  Substance and Sexual Activity   Alcohol use: No     Alcohol/week: 0.0 standard drinks of alcohol   Drug use: No   Sexual activity: Not Currently    Partners: Female  Other Topics Concern   Not on file  Social History Narrative   Lives alone in a one story home.  Has one daughter.  Works as a Air traffic controller.  Education: high school.    Social Determinants of Health   Financial Resource Strain: Not on file  Food Insecurity: Not on file  Transportation Needs: Not on file  Physical Activity: Not on file  Stress: Not on file  Social Connections: Not on file  Intimate Partner Violence: Not on file     Review of Systems: General: negative for chills, fever, night sweats or weight changes.  Cardiovascular: negative for chest pain, dyspnea on exertion, edema, orthopnea, palpitations, paroxysmal nocturnal dyspnea or shortness of breath Dermatological: negative for rash Respiratory: negative for cough or wheezing Urologic: negative for hematuria Abdominal: negative for nausea, vomiting, diarrhea, bright red blood per rectum, melena, or hematemesis Neurologic: negative for visual changes, syncope, or dizziness All other systems reviewed and are otherwise negative except as noted above.    Blood pressure 122/60, pulse 100, height 5' 6"$  (1.676 m), weight 233 lb (105.7 kg).  General appearance: alert and no distress Neck: no adenopathy, no carotid bruit, no JVD, supple, symmetrical, trachea midline, and thyroid not enlarged, symmetric, no tenderness/mass/nodules Lungs: clear to auscultation bilaterally Heart: regular rate and rhythm, S1, S2 normal, no murmur, click, rub or gallop Extremities: extremities normal, atraumatic, no cyanosis or edema Pulses: 2+ and symmetric Skin: Skin color, texture, turgor normal. No rashes or lesions Neurologic: Grossly normal  EKG sinus tachycardia 100 with voltage criteria for LVH.  I personally reviewed this EKG.  ASSESSMENT AND PLAN:   CAD (coronary artery disease) History of CAD status post  remote RCA stenting.  He ultimately underwent coronary artery bypass grafting x 3 by Barry [name]  07/04/2012 with a [name] to his LAD, vein to diagonal branch and to the PDA.  He had a Myoview stress test performed in March 2019 that showed subtle inferior ischemia pervasiveness he underwent diagnostic cath by myself revealing an occluded RCA vein graft with left-to-right collaterals.  I elected to treat him medically.  He does get dyspnea and chest pain on a weekly basis.  He needs a right total hip replacement.  I am going to get a 2D echo and a Lexiscan Myoview to further evaluate.  Essential hypertension, benign History of essential hypertension a blood pressure measured today 122/60.  He is on lisinopril and metoprolol.  Hyperlipidemia LDL goal <70 History of hyperlipidemia on statin therapy with lipid profile performed 03/01/2022 revealing total cholesterol 113, LDL 50 and  HDL 45.     Barry Harp MD FACP,FACC,FAHA, [facility] 03/09/2022 2:21 PM

## 2022-03-09 NOTE — Assessment & Plan Note (Signed)
History of essential hypertension a blood pressure measured today 122/60.  He is on lisinopril and metoprolol.

## 2022-03-09 NOTE — Assessment & Plan Note (Signed)
History of hyperlipidemia on statin therapy with lipid profile performed 03/01/2022 revealing total cholesterol 113, LDL 50 and HDL 45.

## 2022-03-09 NOTE — Assessment & Plan Note (Signed)
History of CAD status post remote RCA stenting.  He ultimately underwent coronary artery bypass grafting x 3 by Dr. Roxy Manns  07/04/2012 with a LIMA to his LAD, vein to diagonal branch and to the PDA.  He had a Myoview stress test performed in March 2019 that showed subtle inferior ischemia pervasiveness he underwent diagnostic cath by myself revealing an occluded RCA vein graft with left-to-right collaterals.  I elected to treat him medically.  He does get dyspnea and chest pain on a weekly basis.  He needs a right total hip replacement.  I am going to get a 2D echo and a Lexiscan Myoview to further evaluate.

## 2022-03-09 NOTE — Patient Instructions (Signed)
Medication Instructions:  Your physician recommends that you continue on your current medications as directed. Please refer to the Current Medication list given to you today.  *If you need a refill on your cardiac medications before your next appointment, please call your pharmacy*   Testing/Procedures: Your physician has requested that you have a lexiscan myoview. For further information please visit [email or web address]. Please follow instruction sheet, as given. This will take place at [address], [address]  How to prepare for your Myocardial Perfusion Test: Do not eat or drink 3 hours prior to your test, except you may have water. Do not consume products containing caffeine (regular or decaffeinated) 12 hours prior to your test. (ex: coffee, chocolate, sodas, tea). Do bring a list of your current medications with you.  If not listed below, you may take your medications as normal. Do wear comfortable clothes (no dresses or overalls) and walking shoes, tennis shoes preferred (No heels or open toe shoes are allowed). Do NOT wear cologne, perfume, aftershave, or lotions (deodorant is allowed). The test will take approximately 3 to 4 hours to complete If these instructions are not followed, your test will have to be rescheduled.   Your physician has requested that you have an echocardiogram. Echocardiography is a painless test that uses sound waves to create images of your heart. It provides your doctor with information about the size and shape of your heart and how well your heart's chambers and valves are working. This procedure takes approximately one hour. There are no restrictions for this procedure. Please do NOT wear cologne, perfume, aftershave, or lotions (deodorant is allowed). Please arrive 15 minutes prior to your appointment time.  This procedure will be done at [address]    Follow-Up: At [facility], you and your health needs are our priority.   As part of our continuing mission to provide you with exceptional heart care, we have created designated Provider Care Teams.  These Care Teams include your primary Cardiologist (physician) and Advanced Practice Providers (APPs -  Physician Assistants and Nurse Practitioners) who all work together to provide you with the care you need, when you need it.  We recommend signing up for the patient portal called "MyChart".  Sign up information is provided on this After Visit Summary.  MyChart is used to connect with patients for Virtual Visits (Telemedicine).  Patients are able to view lab/test results, encounter notes, upcoming appointments, etc.  Non-urgent messages can be sent to your provider as well.   To learn more about what you can do with MyChart, go to [email or web address].    Your next appointment:   12 month(s)  Provider:   Quay Burow, MD

## 2022-03-10 ENCOUNTER — Telehealth: Payer: Self-pay | Admitting: Cardiovascular Disease

## 2022-03-10 NOTE — Telephone Encounter (Signed)
Patient called to say the referral that our office sent, the office told patient that they coudnt get him in till July. Patient calling in to see what other offices are there. Please advise

## 2022-03-10 NOTE — Telephone Encounter (Signed)
Left message for patient to call back. He was seen yesterday. No referral ordered - unsure what this is in reference to.

## 2022-03-12 NOTE — Telephone Encounter (Signed)
Pt stated the referral was from his PCP for an eye exam.

## 2022-04-05 ENCOUNTER — Telehealth: Payer: Self-pay

## 2022-04-05 NOTE — Telephone Encounter (Signed)
   Pre-operative Risk Assessment    Patient Name: Barry [name]  DOB: 1955-06-08 MRN: [id number]      Request for Surgical Clearance    Procedure:   Right Total Hip Arthroplasty  Date of Surgery:  Clearance TBD                                 Surgeon:  Dr Rod Can Surgeon's Group or Practice Name:  [facility] Phone number:  [phone] Fax number:  [phone]   Type of Clearance Requested:   - Medical  - Pharmacy:  Hold Aspirin and Clopidogrel (Plavix)     Type of Anesthesia:  Spinal   Additional requests/questions:   Hospital Cases: All cases will have a hospital PAT appointment with labs per anesthesia protocol 10-14 days prior to surgery. We will manage Hgb A1C. Order additional labs if needed (labs valid for 3 mo).  [name]   04/05/2022, 4:11 PM

## 2022-04-07 NOTE — Telephone Encounter (Signed)
Spoke with patient and he was given detailed instructions about his STRESS TEST on 04/09/22.

## 2022-04-07 NOTE — Telephone Encounter (Signed)
Patient is pending an echo and Myoview per Dr. Kennon Holter recent clinic note.  Procedures are currently scheduled for 04/09/2022

## 2022-04-08 ENCOUNTER — Encounter (HOSPITAL_COMMUNITY): Payer: Medicare HMO

## 2022-04-09 ENCOUNTER — Ambulatory Visit (HOSPITAL_BASED_OUTPATIENT_CLINIC_OR_DEPARTMENT_OTHER): Payer: Medicare HMO

## 2022-04-09 ENCOUNTER — Ambulatory Visit (HOSPITAL_COMMUNITY): Payer: Medicare HMO | Attending: Cardiology

## 2022-04-09 DIAGNOSIS — I10 Essential (primary) hypertension: Secondary | ICD-10-CM | POA: Insufficient documentation

## 2022-04-09 DIAGNOSIS — I25118 Atherosclerotic heart disease of native coronary artery with other forms of angina pectoris: Secondary | ICD-10-CM

## 2022-04-09 DIAGNOSIS — Z951 Presence of aortocoronary bypass graft: Secondary | ICD-10-CM

## 2022-04-09 LAB — MYOCARDIAL PERFUSION IMAGING
LV dias vol: 57 mL (ref 62–150)
LV sys vol: 22 mL
Nuc Stress EF: 62 %
Peak HR: 92 {beats}/min
Rest HR: 75 {beats}/min
Rest Nuclear Isotope Dose: 10.9 mCi
SDS: 0
SRS: 0
SSS: 0
ST Depression (mm): 0 mm
Stress Nuclear Isotope Dose: 31 mCi
TID: 1.06

## 2022-04-09 LAB — ECHOCARDIOGRAM COMPLETE
Area-P 1/2: 3.6 cm2
Est EF: >75
Height: 66 in
S' Lateral: 1.9 cm
Weight: 3728 oz

## 2022-04-09 MED ORDER — TECHNETIUM TC 99M TETROFOSMIN IV KIT
31.0000 | PACK | Freq: Once | INTRAVENOUS | Status: AC | PRN
  Administered 2022-04-09: 31 via INTRAVENOUS

## 2022-04-09 MED ORDER — TECHNETIUM TC 99M TETROFOSMIN IV KIT
10.9000 | PACK | Freq: Once | INTRAVENOUS | Status: AC | PRN
  Administered 2022-04-09: 10.9 via INTRAVENOUS

## 2022-04-09 MED ORDER — PERFLUTREN LIPID MICROSPHERE
1.0000 mL | INTRAVENOUS | Status: AC | PRN
  Administered 2022-04-09: 2 mL via INTRAVENOUS

## 2022-04-09 MED ORDER — REGADENOSON 0.4 MG/5ML IV SOLN
0.4000 mg | Freq: Once | INTRAVENOUS | Status: AC
  Administered 2022-04-09: 0.4 mg via INTRAVENOUS

## 2022-04-10 ENCOUNTER — Other Ambulatory Visit: Payer: Self-pay | Admitting: Hematology & Oncology

## 2022-04-10 DIAGNOSIS — K219 Gastro-esophageal reflux disease without esophagitis: Secondary | ICD-10-CM

## 2022-04-12 ENCOUNTER — Encounter: Payer: Self-pay | Admitting: Family

## 2022-04-12 NOTE — Telephone Encounter (Signed)
   Name: KASHAWN LAUREN  DOB: [birth year]  MRN: [id number]   Primary Cardiologist: Quay Burow, MD  Chart reviewed as part of pre-operative protocol coverage.  Delshon Kanhai Bazaldua was last seen on 03/09/2022 by Dr. Gwenlyn Found.  He underwent normal echo and stress testing which was found to be low risk.  Per Dr. Gwenlyn Found: "Low risk nonischemic Myoview.  Cleared for hip surgery at low risk."   Therefore, based on ACC/AHA guidelines, the patient would be at acceptable risk for the planned procedure without further cardiovascular testing.   Per office protocol, he may hold Plavix for 5 days prior to procedure and should resume as soon as hemodynamically stable postoperatively.   Ideally aspirin should be continued without interruption, however if the bleeding risk is too great, aspirin may be held for 5-7 days prior to surgery. Please resume aspirin post operatively when it is felt to be safe from a bleeding standpoint.     I will route this recommendation to the requesting party via Epic fax function and remove from pre-op pool. Please call with questions.  Mayra Reel, NP 04/12/2022, 1:33 PM

## 2022-04-12 NOTE — Telephone Encounter (Signed)
Kerri, surgery scheduler for Dr. Lyla Glassing left vm requesting if we could give clearance for Plavix as well. She has received the clearance however we only noted about holding ASA and not Plavix.   I will forward back to pre op pool for review for Plavix. I will send FYI to surgery scheduler that I got her vm and I am addressing with pre op app.

## 2022-04-12 NOTE — Telephone Encounter (Signed)
   Name: Barry [name]  DOB: Oct 19, 1955  MRN: [id number]   Primary Cardiologist: Quay Burow, MD  Chart reviewed as part of pre-operative protocol coverage.  Lanorris Krajicek Hargan was last seen on 03/09/2022 by Dr. Gwenlyn Found.  He underwent normal echo and stress testing which was found to be low risk.  Per Dr. Gwenlyn Found: "Low risk nonischemic Myoview.  Cleared for hip surgery at low risk."  Therefore, based on ACC/AHA guidelines, the patient would be at acceptable risk for the planned procedure without further cardiovascular testing.   Ideally aspirin should be continued without interruption, however if the bleeding risk is too great, aspirin may be held for 5-7 days prior to surgery. Please resume aspirin post operatively when it is felt to be safe from a bleeding standpoint.    I will route this recommendation to the requesting party via Epic fax function and remove from pre-op pool. Please call with questions.  Mayra Reel, NP 04/12/2022, 10:01 AM

## 2022-04-18 ENCOUNTER — Other Ambulatory Visit: Payer: Self-pay

## 2022-04-18 ENCOUNTER — Emergency Department (HOSPITAL_COMMUNITY): Payer: Medicare HMO

## 2022-04-18 ENCOUNTER — Encounter (HOSPITAL_COMMUNITY): Payer: Self-pay

## 2022-04-18 ENCOUNTER — Inpatient Hospital Stay (HOSPITAL_COMMUNITY)
Admission: EM | Admit: 2022-04-18 | Discharge: 2022-04-24 | DRG: 071 | Disposition: A | Discharge status: Skilled Nursing Facility | Payer: Medicare HMO | Attending: Internal Medicine | Admitting: Internal Medicine

## 2022-04-18 DIAGNOSIS — R188 Other ascites: Secondary | ICD-10-CM | POA: Diagnosis present

## 2022-04-18 DIAGNOSIS — M549 Dorsalgia, unspecified: Secondary | ICD-10-CM | POA: Diagnosis present

## 2022-04-18 DIAGNOSIS — I959 Hypotension, unspecified: Secondary | ICD-10-CM

## 2022-04-18 DIAGNOSIS — G9341 Metabolic encephalopathy: Principal | ICD-10-CM | POA: Diagnosis present

## 2022-04-18 DIAGNOSIS — M1611 Unilateral primary osteoarthritis, right hip: Secondary | ICD-10-CM | POA: Diagnosis present

## 2022-04-18 DIAGNOSIS — Z7951 Long term (current) use of inhaled steroids: Secondary | ICD-10-CM

## 2022-04-18 DIAGNOSIS — N179 Acute kidney failure, unspecified: Secondary | ICD-10-CM | POA: Diagnosis not present

## 2022-04-18 DIAGNOSIS — E669 Obesity, unspecified: Secondary | ICD-10-CM | POA: Diagnosis present

## 2022-04-18 DIAGNOSIS — I25118 Atherosclerotic heart disease of native coronary artery with other forms of angina pectoris: Secondary | ICD-10-CM | POA: Diagnosis not present

## 2022-04-18 DIAGNOSIS — E1140 Type 2 diabetes mellitus with diabetic neuropathy, unspecified: Secondary | ICD-10-CM | POA: Diagnosis present

## 2022-04-18 DIAGNOSIS — Z970 Presence of artificial eye: Secondary | ICD-10-CM

## 2022-04-18 DIAGNOSIS — Z7984 Long term (current) use of oral hypoglycemic drugs: Secondary | ICD-10-CM

## 2022-04-18 DIAGNOSIS — I9589 Other hypotension: Secondary | ICD-10-CM

## 2022-04-18 DIAGNOSIS — I10 Essential (primary) hypertension: Secondary | ICD-10-CM | POA: Diagnosis present

## 2022-04-18 DIAGNOSIS — G8929 Other chronic pain: Secondary | ICD-10-CM | POA: Diagnosis present

## 2022-04-18 DIAGNOSIS — Z794 Long term (current) use of insulin: Secondary | ICD-10-CM

## 2022-04-18 DIAGNOSIS — K766 Portal hypertension: Secondary | ICD-10-CM | POA: Diagnosis present

## 2022-04-18 DIAGNOSIS — Z800 Family history of malignant neoplasm of digestive organs: Secondary | ICD-10-CM

## 2022-04-18 DIAGNOSIS — Z79899 Other long term (current) drug therapy: Secondary | ICD-10-CM

## 2022-04-18 DIAGNOSIS — D863 Sarcoidosis of skin: Secondary | ICD-10-CM | POA: Diagnosis present

## 2022-04-18 DIAGNOSIS — E861 Hypovolemia: Secondary | ICD-10-CM

## 2022-04-18 DIAGNOSIS — G4733 Obstructive sleep apnea (adult) (pediatric): Secondary | ICD-10-CM | POA: Diagnosis present

## 2022-04-18 DIAGNOSIS — K3189 Other diseases of stomach and duodenum: Secondary | ICD-10-CM | POA: Diagnosis present

## 2022-04-18 DIAGNOSIS — I252 Old myocardial infarction: Secondary | ICD-10-CM

## 2022-04-18 DIAGNOSIS — R4182 Altered mental status, unspecified: Secondary | ICD-10-CM | POA: Diagnosis not present

## 2022-04-18 DIAGNOSIS — K219 Gastro-esophageal reflux disease without esophagitis: Secondary | ICD-10-CM | POA: Diagnosis present

## 2022-04-18 DIAGNOSIS — K7460 Unspecified cirrhosis of liver: Secondary | ICD-10-CM | POA: Diagnosis not present

## 2022-04-18 DIAGNOSIS — R824 Acetonuria: Secondary | ICD-10-CM | POA: Diagnosis present

## 2022-04-18 DIAGNOSIS — D6959 Other secondary thrombocytopenia: Secondary | ICD-10-CM | POA: Diagnosis present

## 2022-04-18 DIAGNOSIS — R300 Dysuria: Secondary | ICD-10-CM | POA: Diagnosis present

## 2022-04-18 DIAGNOSIS — E519 Thiamine deficiency, unspecified: Secondary | ICD-10-CM | POA: Diagnosis present

## 2022-04-18 DIAGNOSIS — Z6838 Body mass index (BMI) 38.0-38.9, adult: Secondary | ICD-10-CM

## 2022-04-18 DIAGNOSIS — Z833 Family history of diabetes mellitus: Secondary | ICD-10-CM

## 2022-04-18 DIAGNOSIS — G9340 Encephalopathy, unspecified: Principal | ICD-10-CM

## 2022-04-18 DIAGNOSIS — R531 Weakness: Secondary | ICD-10-CM | POA: Diagnosis present

## 2022-04-18 DIAGNOSIS — R296 Repeated falls: Secondary | ICD-10-CM | POA: Diagnosis present

## 2022-04-18 DIAGNOSIS — Z955 Presence of coronary angioplasty implant and graft: Secondary | ICD-10-CM

## 2022-04-18 DIAGNOSIS — Z7982 Long term (current) use of aspirin: Secondary | ICD-10-CM

## 2022-04-18 DIAGNOSIS — F419 Anxiety disorder, unspecified: Secondary | ICD-10-CM | POA: Diagnosis present

## 2022-04-18 DIAGNOSIS — J90 Pleural effusion, not elsewhere classified: Secondary | ICD-10-CM | POA: Diagnosis present

## 2022-04-18 DIAGNOSIS — M25551 Pain in right hip: Secondary | ICD-10-CM

## 2022-04-18 DIAGNOSIS — Z951 Presence of aortocoronary bypass graft: Secondary | ICD-10-CM

## 2022-04-18 DIAGNOSIS — I2510 Atherosclerotic heart disease of native coronary artery without angina pectoris: Secondary | ICD-10-CM | POA: Diagnosis present

## 2022-04-18 DIAGNOSIS — R7989 Other specified abnormal findings of blood chemistry: Secondary | ICD-10-CM | POA: Diagnosis present

## 2022-04-18 DIAGNOSIS — E860 Dehydration: Secondary | ICD-10-CM | POA: Diagnosis present

## 2022-04-18 DIAGNOSIS — Z7902 Long term (current) use of antithrombotics/antiplatelets: Secondary | ICD-10-CM

## 2022-04-18 DIAGNOSIS — E785 Hyperlipidemia, unspecified: Secondary | ICD-10-CM | POA: Diagnosis present

## 2022-04-18 LAB — COMPREHENSIVE METABOLIC PANEL
ALT: 120 U/L — ABNORMAL HIGH (ref 0–44)
AST: 158 U/L — ABNORMAL HIGH (ref 15–41)
Albumin: 3.1 g/dL — ABNORMAL LOW (ref 3.5–5.0)
Alkaline Phosphatase: 134 U/L — ABNORMAL HIGH (ref 38–126)
Anion gap: 7 (ref 5–15)
BUN: 31 mg/dL — ABNORMAL HIGH (ref 8–23)
CO2: 24 mmol/L (ref 22–32)
Calcium: 8.1 mg/dL — ABNORMAL LOW (ref 8.9–10.3)
Chloride: 104 mmol/L (ref 98–111)
Creatinine, Ser: 1.11 mg/dL (ref 0.61–1.24)
GFR, Estimated: >60 mL/min (ref >60)
Glucose, Bld: 78 mg/dL (ref 70–99)
Potassium: 4.2 mmol/L (ref 3.5–5.1)
Sodium: 135 mmol/L (ref 135–145)
Total Bilirubin: 1.7 mg/dL — ABNORMAL HIGH (ref 0.3–1.2)
Total Protein: 7.3 g/dL (ref 6.5–8.1)

## 2022-04-18 LAB — CBC WITH DIFFERENTIAL/PLATELET
Abs Immature Granulocytes: 0.02 10*3/uL (ref 0.00–0.07)
Basophils Absolute: 0.1 10*3/uL (ref 0.0–0.1)
Basophils Relative: 1 %
Eosinophils Absolute: 0.2 10*3/uL (ref 0.0–0.5)
Eosinophils Relative: 3 %
HCT: 36.9 % — ABNORMAL LOW (ref 39.0–52.0)
Hemoglobin: 12 g/dL — ABNORMAL LOW (ref 13.0–17.0)
Immature Granulocytes: 0 %
Lymphocytes Relative: 22 %
Lymphs Abs: 1.3 10*3/uL (ref 0.7–4.0)
MCH: 34.5 pg — ABNORMAL HIGH (ref 26.0–34.0)
MCHC: 32.5 g/dL (ref 30.0–36.0)
MCV: 106 fL — ABNORMAL HIGH (ref 80.0–100.0)
Monocytes Absolute: 0.9 10*3/uL (ref 0.1–1.0)
Monocytes Relative: 16 %
Neutro Abs: 3.4 10*3/uL (ref 1.7–7.7)
Neutrophils Relative %: 58 %
Platelets: 76 10*3/uL — ABNORMAL LOW (ref 150–400)
RBC: 3.48 MIL/uL — ABNORMAL LOW (ref 4.22–5.81)
RDW: 17.7 % — ABNORMAL HIGH (ref 11.5–15.5)
WBC: 5.8 10*3/uL (ref 4.0–10.5)
nRBC: 0 % (ref 0.0–0.2)

## 2022-04-18 LAB — PROTIME-INR
INR: 1.3 — ABNORMAL HIGH (ref 0.8–1.2)
Prothrombin Time: 15.6 seconds — ABNORMAL HIGH (ref 11.4–15.2)

## 2022-04-18 LAB — URINALYSIS, ROUTINE W REFLEX MICROSCOPIC
Bacteria, UA: NONE SEEN
Glucose, UA: NEGATIVE mg/dL
Hgb urine dipstick: NEGATIVE
Ketones, ur: 5 mg/dL — AB
Leukocytes,Ua: NEGATIVE
Nitrite: NEGATIVE
Protein, ur: 30 mg/dL — AB
Specific Gravity, Urine: 1.027 (ref 1.005–1.030)
pH: 5 (ref 5.0–8.0)

## 2022-04-18 LAB — BLOOD GAS, VENOUS
Acid-base deficit: 1.5 mmol/L (ref 0.0–2.0)
Bicarbonate: 22.9 mmol/L (ref 20.0–28.0)
O2 Saturation: 99.9 %
Patient temperature: 37
pCO2, Ven: 37 mmHg — ABNORMAL LOW (ref 44–60)
pH, Ven: 7.4 (ref 7.25–7.43)
pO2, Ven: 130 mmHg — ABNORMAL HIGH (ref 32–45)

## 2022-04-18 LAB — AMMONIA: Ammonia: 54 umol/L — ABNORMAL HIGH (ref 9–35)

## 2022-04-18 LAB — RAPID URINE DRUG SCREEN, HOSP PERFORMED
Amphetamines: NOT DETECTED
Barbiturates: NOT DETECTED
Benzodiazepines: POSITIVE — AB
Cocaine: NOT DETECTED
Opiates: POSITIVE — AB
Tetrahydrocannabinol: NOT DETECTED

## 2022-04-18 LAB — ETHANOL: Alcohol, Ethyl (B): <10 mg/dL (ref <10)

## 2022-04-18 LAB — LACTIC ACID, PLASMA: Lactic Acid, Venous: 1.2 mmol/L (ref 0.5–1.9)

## 2022-04-18 LAB — SALICYLATE LEVEL: Salicylate Lvl: <7 mg/dL — ABNORMAL LOW (ref 7.0–30.0)

## 2022-04-18 LAB — TROPONIN I (HIGH SENSITIVITY): Troponin I (High Sensitivity): 6 ng/L (ref <18)

## 2022-04-18 LAB — ACETAMINOPHEN LEVEL: Acetaminophen (Tylenol), Serum: 17 ug/mL (ref 10–30)

## 2022-04-18 MED ORDER — LACTATED RINGERS IV SOLN: INTRAVENOUS | Status: DC

## 2022-04-18 NOTE — ED Notes (Signed)
Portable xray at bedside.

## 2022-04-18 NOTE — H&P (Incomplete)
History and Physical    Patient: Barry [name] DOB: August 01, 1955 DOA: 04/18/2022 DOS: the patient was seen and examined on 04/19/2022 PCP: Janith Lima, MD  Patient coming from: Home  Chief Complaint:  Chief Complaint  Patient presents with   Weakness   HPI: Barry [name] is a 67 y.o. male with medical history significant of CAD s/p CAGB x3, HTN, HLD, cirrhosis, insulin dependent T2DM, OSA, obesity who presents with AMS.  Pt was very lethargic and chronically ill appearing on exam. He awoke to voice and light touch but drifts back to sleep. He was able to tell me [place] Nodded his head when asked if he has been feeling sick but unable to tell me his symptoms.  He was able to follow commands with lifting his extremities and turning for me to evaluate his back.  However continued to be drowsy and asleep throughout most of exam.  He had a notable smaller pupil on the left compared to the right but noted on CT head to have a right globe prosthesis.Had audible gargling of sputum with breathing with CXR finding of small bilateral pleural effusions.   No findings of any skin wound or breakdown.   He was afebrile, hypotensive with BP of 92 over 60s on room air. no leukocytosis, hemoglobin 12, chronic thrombocytopenia with platelet of 76.  Sodium of 135, K of 4.2, creatinine elevated at 1.11 from prior of 0.81.  Had elevated AST and ALT recently but this is increased at 158 and 120 respectively.   INR and PTT elevated at 1.3/15.6.   Ammonia level also elevated at 54.  pH was reassuring at 7.4 with normal bicarb.  UA was negative but had hyaline cast. UDS positive for benzo and opioids.  Tylenol level of 17, EtOH of less than 10.  He had meticulous records of his medical hx at bedside inside folders. Noted to have low thiamine level of 7 in February and was started on supplementations.   Had endoscopy on 06/17/2021 with Grade I varices and moderate  portal hypertensive gastropathy.  Review of Systems: unable to review all systems due to the inability of the patient to answer questions. Past Medical History:  Diagnosis Date   Coronary artery disease    Family history of colon cancer 10/15/2019   GERD (gastroesophageal reflux disease)    Hyperlipidemia    MI (myocardial infarction) ([place]) 04/23/2007   inferior wall   Morbid obesity ([place]) 06/26/2012   S/P CABG x 3 07/04/2012   LIMA to LAD, SVG to D1, SVG to PDA, EVH via right thigh   Sleep apnea    Type II or unspecified type diabetes mellitus without mention of complication, not stated as uncontrolled    Unspecified essential hypertension    Past Surgical History:  Procedure Laterality Date   CORONARY ANGIOPLASTY WITH STENT PLACEMENT  04/23/2007   PCI and stenting of mid RCA - Dr Donnetta Hutching @ [facility]   CORONARY ARTERY BYPASS GRAFT N/A 07/04/2012   Procedure: CORONARY ARTERY BYPASS GRAFTING (CABG);  Surgeon: Rexene Alberts, MD;  Location: [place];  Service: Open Heart Surgery;  Laterality: N/A;  x3 using right greater saphenous vein and left internal mammary.    INTRAOPERATIVE TRANSESOPHAGEAL ECHOCARDIOGRAM N/A 07/04/2012   Procedure: INTRAOPERATIVE TRANSESOPHAGEAL ECHOCARDIOGRAM;  Surgeon: Rexene Alberts, MD;  Location: [place];  Service: Open Heart Surgery;  Laterality: N/A;   LEFT HEART CATH AND CORS/GRAFTS ANGIOGRAPHY N/A 04/18/2017  Procedure: LEFT HEART CATH AND CORS/GRAFTS ANGIOGRAPHY;  Surgeon: Lorretta Harp, MD;  Location: [place] CV LAB;  Service: Cardiovascular;  Laterality: N/A;   LEFT HEART CATHETERIZATION WITH CORONARY ANGIOGRAM N/A 06/25/2012   Procedure: LEFT HEART CATHETERIZATION WITH CORONARY ANGIOGRAM;  Surgeon: Lorretta Harp, MD;  Location: [facility] C[facility];  Service: Cardiovascular;  Laterality: N/A;   TOOTH EXTRACTION  04/2018   4 teeth pulled    Social History:  reports that he has never smoked. He has never used smokeless tobacco. He reports that he does not drink  alcohol and does not use drugs.  No Known Allergies  Family History  Problem Relation Age of Onset   Cancer Mother        unknown type; dx late 41s   Cancer Father        unknown type; dx > 69yo   Colon cancer Brother        dx late 14s   Colon cancer Brother        dx late 30s; dx 90   Cancer Sister        unknown type; dx late 83s   Diabetes Sister    Diabetes Brother    Cancer Maternal Aunt        unknown type; dx > 60yo    Prior to Admission medications   Medication Sig Start Date End Date Taking? Authorizing Provider  acetaminophen (TYLENOL) 500 MG tablet Take 1,000 mg by mouth every 8 (eight) hours as needed for moderate pain.     [provider]  albuterol (PROVENTIL HFA;VENTOLIN HFA) 108 (90 Base) MCG/ACT inhaler Inhale 2 puffs into the lungs every 6 (six) hours as needed for wheezing or shortness of breath. 04/21/17   Gildardo Cranker, DO  aspirin 81 MG chewable tablet Chew 81 mg by mouth daily.     [provider]  atorvastatin (LIPITOR) 80 MG tablet Take 80 mg by mouth. 11/03/17   [provider]  Blood Glucose Monitoring Suppl (CONTOUR NEXT EZ MONITOR) w/Device KIT Test blood sugar three times daily E11.22 03/08/16   Tivis Ringer, RPH-CPP  busPIRone (BUSPAR) 15 MG tablet TAKE 1 TABLET BY MOUTH THREE TIMES DAILY FOR ANXIETY 07/19/17   Gildardo Cranker, DO  ciclopirox (LOPROX) 0.77 % cream Apply topically 2 (two) times daily. 03/01/22   Janith Lima, MD  clopidogrel (PLAVIX) 75 MG tablet APPOINTMENT OVERDUE Take 1 by mouth daily 10/31/19   [name], NP  cyclobenzaprine (FLEXERIL) 10 MG tablet Take 10 mg by mouth 2 (two) times daily as needed. 07/23/19   [provider]  FEROSUL 325 (65 Fe) MG tablet Take 325 mg by mouth daily. 10/13/21   [provider]  gabapentin (NEURONTIN) 300 MG capsule Take 600 mg by mouth 3 (three) times daily.  09/12/19   [provider]  lisinopril (ZESTRIL) 5 MG tablet TAKE 1 TABLET(5 MG)  BY MOUTH DAILY 03/06/22   Janith Lima, MD  metFORMIN (GLUCOPHAGE) 1000 MG tablet TAKE 1 TABLET(1000 MG) BY MOUTH TWICE DAILY 09/09/17   Gildardo Cranker, DO  metoprolol tartrate (LOPRESSOR) 25 MG tablet Take 0.5 tablets (12.5 mg total) by mouth 2 (two) times daily. 05/18/17   Gildardo Cranker, DO  pantoprazole (PROTONIX) 40 MG tablet TAKE 1 TABLET BY MOUTH EVERY DAY 04/12/22   Volanda Napoleon, MD  sertraline (ZOLOFT) 100 MG tablet TAKE 2 TABLETS(200 MG) BY MOUTH DAILY 04/20/19   [name], NP  SYMBICORT 80-4.5 MCG/ACT inhaler Inhale  into the lungs. 11/16/21   [provider]  thiamine (VITAMIN B-1) 100 MG tablet Take 1 tablet (100 mg total) by mouth daily. 03/06/22   Janith Lima, MD    Physical Exam: Vitals:   04/18/22 2030 04/18/22 2045 04/18/22 2100 04/18/22 2200  BP: 124/73 110/62  108/61  Pulse: 94 95  88  Resp: 11 13  19   Temp:   97.7 F (36.5 C)   SpO2: 95% 91%  96%  Weight:      Height:       Constitutional: NAD, lethargic and ill-appearing elderly male laying in bed Eyes: Small left pupil compared to right.  Right pupil not reactive to light and was noted on CT to be prosthesis.  Clear discharge of the right eye. ENMT: Mucous membranes are moist. Neck: normal, supple Respiratory: clear to auscultation bilaterally, no wheezing, no crackles. Normal respiratory effort. No accessory muscle use.  Cardiovascular: Regular rate and rhythm, no murmurs / rubs / gallops. No extremity edema.  Abdomen: Soft, nondistended, no grimacing with palpation bowel sounds positive.  Musculoskeletal: no clubbing / cyanosis. No joint deformity upper and lower extremities.  Normal muscle tone.  Skin: Scattered linear strip of vesicular papular rash on anterior mid abdomen Neurologic: Lethargic but awoke to voice and light touch.  Would attempt to answer questions by nodding and was able to tell me his name and that he was at [facility].  He was able to do bilateral handgrip which was  weak and able to lift lower extremity against gravity but not with resistance when prompted Psychiatric: Unable to fully assess with lethargy Data Reviewed:  See HPI  Assessment and Plan: * Acute metabolic encephalopathy - Presented with profound lethargy and reportedly sent in by roommate for acute altered mental status.  Unable to elicit any history from patient. -Encephalopathy could be multifactorial from hepatic encephalopathy, thiamine deficiency and polypharmacy.  Per documentation, he recently moved back from [place] and was following with GI for cirrhosis-unclear cause of cirrhosis. ETOH is low here. Ammonia level is mildly elevated today at 54 which could be contributory.  He also was noted to have thiamine deficiency with level around 7 back in February and placed on supplementation but unclear if he has been taking this.  UDS positive for benzo and opioids.  He does have a short course of oxycodone prescribed for chronic back pain as he recently was evaluated in the ED for this.  No findings of benzodiazepine prescription on [place] database.  He also is on gabapentin. -No fever or leukocytosis. Negative CXR and UA.  -CT head is negative.  -will give one time dose of Narcan -will give one time dose of Lactulose enema -also start high dose Thiamine -continue to follow clinically. Able to maintain airway at this time.   AKI (acute kidney injury) ([place]) - Creatinine elevated 1.11 from prior of 0.81 - Keep on continuous IV fluids overnight - Avoid nephrotoxic agent  Hypotension Has AKI as well so likely hypovolemic -continuous IV fluid overnight and follow  Cirrhosis of liver without ascites (HCC) - No ascites - AST and ALT have worsened compared to prior of the 8 and 120 - Ammonia level also elevated which could be contributory to his encephalopathy.  Will be given lactulose enema x 1. -Had endoscopy on 06/17/2021 with Grade I varices and moderate portal hypertensive gastropathy.    Type 2 diabetes mellitus with diabetic neuropathy, with long-term current use of insulin (HCC) - Last hemoglobin A1c  of 6.7 in February - Will hold on sliding scale for now as he is n.p.o. and lethargic  Hyperlipidemia LDL goal <70 - Resume statin once he is able to tolerate p.o.  CAD (coronary artery disease) -S/p CABG -Noted in documentation at bedside that he has echocardiogram and Myoview stress test planned with cardiology sometime in late March. -Troponin today is reassuring at 6      Advance Care Planning:   Code Status: Full Code   Consults: none  Family Communication: no family at bedside  Severity of Illness: The appropriate patient status for this patient is INPATIENT. Inpatient status is judged to be reasonable and necessary in order to provide the required intensity of service to ensure the patient's safety. The patient's presenting symptoms, physical exam findings, and initial radiographic and laboratory data in the context of their chronic comorbidities is felt to place them at high risk for further clinical deterioration. Furthermore, it is not anticipated that the patient will be medically stable for discharge from the hospital within 2 midnights of admission.   * I certify that at the point of admission it is my clinical judgment that the patient will require inpatient hospital care spanning beyond 2 midnights from the point of admission due to high intensity of service, high risk for further deterioration and high frequency of surveillance required.*  Author: Orene Desanctis, DO 04/19/2022 12:30 AM  For on call review [email or web address].

## 2022-04-18 NOTE — ED Notes (Signed)
Pt has returned from CT.  

## 2022-04-18 NOTE — ED Triage Notes (Signed)
PER EMS: pt 's friend called EMS due to the patient appearing very weak and lethargic. He arrives alert but very drowsy, unsure of onset. He reports dark urine for 3 days associated with dysuria.  He is confused and slow to answer to questions.   20g LFA, he received 200cc of NaCl pta  BP- 100/60, HR-75, O2-96% 2L [place]

## 2022-04-18 NOTE — ED Provider Notes (Signed)
[place] EMERGENCY DEPARTMENT AT [facility] Provider Note   CSN: [id number] Arrival date & time: 04/18/22  1755     History  Chief Complaint  Patient presents with   Weakness    Barry [name] is a 67 y.o. male.  HPI There is very limited history.  Patient is a poor historian.  Reportedly patient has moved more recently to the [place] area and is living with a friend.  EMR review from prior visit in December 2023 indicates patient has chronic back pain, recurring falls degenerative disc disease and had been living with a family member in [place] but is now in the [place] area.  At baseline patient walks with a walker or a cane.   EMS was called by patient's friend or roommate because the patient seemed very weak and lethargic.  Unclear time of onset.  There is some report from the EMS report that the urine has been dark and patient's been slow and confused answering questions.  EMS reported patient is on baseline 2 L nasal cannula oxygen.  Patient does not give any specific details.  He will answer very simple questions  Home Medications Prior to Admission medications   Medication Sig Start Date End Date Taking? Authorizing Provider  acetaminophen (TYLENOL) 500 MG tablet Take 1,000 mg by mouth every 8 (eight) hours as needed for moderate pain.     [provider]  albuterol (PROVENTIL HFA;VENTOLIN HFA) 108 (90 Base) MCG/ACT inhaler Inhale 2 puffs into the lungs every 6 (six) hours as needed for wheezing or shortness of breath. 04/21/17   Gildardo Cranker, DO  aspirin 81 MG chewable tablet Chew 81 mg by mouth daily.     [provider]  atorvastatin (LIPITOR) 80 MG tablet Take 80 mg by mouth. 11/03/17   [provider]  Blood Glucose Monitoring Suppl (CONTOUR NEXT EZ MONITOR) w/Device KIT Test blood sugar three times daily E11.22 03/08/16   Tivis Ringer, RPH-CPP  busPIRone (BUSPAR) 15 MG tablet TAKE 1 TABLET BY MOUTH THREE TIMES DAILY FOR  ANXIETY 07/19/17   Gildardo Cranker, DO  ciclopirox (LOPROX) 0.77 % cream Apply topically 2 (two) times daily. 03/01/22   Janith Lima, MD  clopidogrel (PLAVIX) 75 MG tablet APPOINTMENT OVERDUE Take 1 by mouth daily 10/31/19   [name], NP  cyclobenzaprine (FLEXERIL) 10 MG tablet Take 10 mg by mouth 2 (two) times daily as needed. 07/23/19   [provider]  FEROSUL 325 (65 Fe) MG tablet Take 325 mg by mouth daily. 10/13/21   [provider]  gabapentin (NEURONTIN) 300 MG capsule Take 600 mg by mouth 3 (three) times daily.  09/12/19   [provider]  lisinopril (ZESTRIL) 5 MG tablet TAKE 1 TABLET(5 MG) BY MOUTH DAILY 03/06/22   Janith Lima, MD  metFORMIN (GLUCOPHAGE) 1000 MG tablet TAKE 1 TABLET(1000 MG) BY MOUTH TWICE DAILY 09/09/17   Gildardo Cranker, DO  metoprolol tartrate (LOPRESSOR) 25 MG tablet Take 0.5 tablets (12.5 mg total) by mouth 2 (two) times daily. 05/18/17   Gildardo Cranker, DO  pantoprazole (PROTONIX) 40 MG tablet TAKE 1 TABLET BY MOUTH EVERY DAY 04/12/22   Volanda Napoleon, MD  sertraline (ZOLOFT) 100 MG tablet TAKE 2 TABLETS(200 MG) BY MOUTH DAILY 04/20/19   [name], NP  SYMBICORT 80-4.5 MCG/ACT inhaler Inhale into the lungs. 11/16/21   [provider]  thiamine (VITAMIN B-1) 100 MG tablet Take 1 tablet (100 mg total) by mouth daily. 03/06/22  Janith Lima, MD      Allergies    Patient has no known allergies.    Review of Systems   Review of Systems  Physical Exam Updated Vital Signs BP 108/61   Pulse 88   Temp 97.7 F (36.5 C)   Resp 19   Ht 5\' 6"  (1.676 m)   Wt 105.7 kg   SpO2 96%   BMI 37.61 kg/m  Physical Exam Constitutional:      Comments: Patient is sedated in appearance.  He awakens to light to moderate stimulus.  No respiratory distress.  Respirations even.  HENT:     Head: Normocephalic and atraumatic.     Mouth/Throat:     Mouth: Mucous membranes are moist.     Pharynx: Oropharynx is clear.  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  Cardiovascular:     Rate and Rhythm: Normal rate and regular rhythm.  Pulmonary:     Comments: Patient does not have respiratory distress but has a little bit of rhonchi. Abdominal:     General: There is no distension.     Palpations: Abdomen is soft.     Tenderness: There is no abdominal tenderness. There is no guarding.     Comments: Abdomen has a hyperpigmented patchy area on the right side that looks like old scars from shingles potentially.  1 dry scabbed lesion in it.  Genitourinary:    Comments: Patient has some candidal rash in the groin field especially on the right. Musculoskeletal:     Comments: Does appear to have any focal motor deficits.  Follow very simple commands for grip strength and a little bit of movement.  No lateralizing weakness.  Skin:    General: Skin is warm and dry.  Neurological:     Comments: Patient is very somnolent.  He arouses to light to moderate stimulus.  He will give a few one-word answers and then goes back to sleep.  No focal flaccid paralysis.  General weak.     ED Results / Procedures / Treatments   Labs (all labs ordered are listed, but only abnormal results are displayed) Labs Reviewed  COMPREHENSIVE METABOLIC PANEL - Abnormal; Notable for the following components:      Result Value   BUN 31 (*)    Calcium 8.1 (*)    Albumin 3.1 (*)    AST 158 (*)    ALT 120 (*)    Alkaline Phosphatase 134 (*)    Total Bilirubin 1.7 (*)    All other components within normal limits  CBC WITH DIFFERENTIAL/PLATELET - Abnormal; Notable for the following components:   RBC 3.48 (*)    Hemoglobin 12.0 (*)    HCT 36.9 (*)    MCV 106.0 (*)    MCH 34.5 (*)    RDW 17.7 (*)    Platelets 76 (*)    All other components within normal limits  PROTIME-INR - Abnormal; Notable for the following components:   Prothrombin Time 15.6 (*)    INR 1.3 (*)    All other components within normal limits   URINALYSIS, ROUTINE W REFLEX MICROSCOPIC - Abnormal; Notable for the following components:   Color, Urine AMBER (*)    Bilirubin Urine SMALL (*)    Ketones, ur 5 (*)    Protein, ur 30 (*)    All other components within normal limits  BLOOD GAS, VENOUS - Abnormal; Notable for the following components:  pCO2, Ven 37 (*)    pO2, Ven 130 (*)    All other components within normal limits  AMMONIA - Abnormal; Notable for the following components:   Ammonia 54 (*)    All other components within normal limits  RAPID URINE DRUG SCREEN, HOSP PERFORMED - Abnormal; Notable for the following components:   Opiates POSITIVE (*)    Benzodiazepines POSITIVE (*)    All other components within normal limits  SALICYLATE LEVEL - Abnormal; Notable for the following components:   Salicylate Lvl [id number] (*)    All other components within normal limits  CULTURE, BLOOD (ROUTINE X 2)  CULTURE, BLOOD (ROUTINE X 2)  LACTIC ACID, PLASMA  ACETAMINOPHEN LEVEL  ETHANOL  TROPONIN I (HIGH SENSITIVITY)  TROPONIN I (HIGH SENSITIVITY)    EKG None  Radiology CT Head Wo Contrast  Result Date: 04/18/2022 CLINICAL DATA:  Mental status changes, unknown cause. EXAM: CT HEAD WITHOUT CONTRAST TECHNIQUE: Contiguous axial images were obtained from the base of the skull through the vertex without intravenous contrast. RADIATION DOSE REDUCTION: This exam was performed according to the departmental dose-optimization program which includes automated exposure control, adjustment of the mA and/or kV according to patient size and/or use of iterative reconstruction technique. COMPARISON:  Prior head CTs are unavailable in PACS at this time. Comparison is made with report of the most recent head CTs dated 04/01/2020 and 04/06/2020 FINDINGS: Brain: Some images are motion limited with loss of fine detail. There is mild cerebral atrophy and small-vessel disease. The ventricles are normal in size and position. Cerebellum and brainstem are  unremarkable. No old territorial infarct is seen. Allowing for motion no acute cortical based infarct, hemorrhage, or mass effect is evident. There is no midline shift. The basal cisterns are clear. Vascular: There are patchy calcifications of the carotid siphons. No hyperdense central vessel is seen. Skull: Negative for fractures or focal lesions. Sinuses/Orbits: No acute orbital findings. Old left lens replacement. A right globe prosthesis. Clear sinuses and mastoid air cells. Other: None. IMPRESSION: No acute intracranial CT findings. Mild atrophy and small-vessel disease. Carotid atherosclerosis. Electronically Signed   By: Telford Nab M.D.   On: 04/18/2022 20:21   DG Chest Port 1 View  Result Date: 04/18/2022 CLINICAL DATA:  Sepsis. Weak and lethargic. Dysuria and dark urine for 3 days. EXAM: PORTABLE CHEST 1 VIEW COMPARISON:  12/30/2021 FINDINGS: Postoperative changes in the mediastinum. Shallow inspiration. Heart size and pulmonary vascularity are normal for technique. Probable small bilateral pleural effusions with basilar atelectasis. No pneumothorax. Mediastinal contours appear intact. IMPRESSION: Shallow inspiration. Probable small pleural effusions with basilar atelectasis. Electronically Signed   By: Lucienne Capers M.D.   On: 04/18/2022 19:32    Procedures Procedures    Medications Ordered in ED Medications  lactated ringers infusion ( Intravenous New Bag/Given 04/18/22 1950)    ED Course/ Medical Decision Making/ A&P                             Medical Decision Making Amount and/or Complexity of Data Reviewed Labs: ordered. Radiology: ordered.  Risk Prescription drug management. Decision regarding hospitalization.   Patient presents as outlined.  Differential diagnosis includes metabolic derangement\infectious etiology\head trauma\oversedation with medication.  Will proceed with broad diagnostic evaluation.  Patient's airway is stable.  He does not have any respiratory  distress and report is baseline 2 L oxygen.  He is not hypoxic and he is not exhibiting respiratory depression.  Blood pressures are stable and peripheral pulses are strong with extremities warm and dry.  CT head no acute findings.  Chest x-ray reviewed by radiology poor inspiration with possible basilar atelectasis.  Urinalysis positive for opioids and benzodiazepine.  Ammonia 54.  Patient is cephalopathic.  At this point with negative CT head and has a drug screen I have suspicion for oversedation due to medications.  Patient is also prescribed gabapentin.  Possible poly pharmacy.  Patient will require admission and observation to rule out other etiology as well.  Recheck: Patient remains stable in terms of blood pressure and heart rate.  He continues to awaken to briefly answer questions to light to moderate stimulus.  Consult: [facility] hospitalist for admission.          Final Clinical Impression(s) / ED Diagnoses Final diagnoses:  Encephalopathy    Rx / DC Orders ED Discharge Orders     None         Charlesetta Shanks, MD 04/26/22 [phone]

## 2022-04-19 ENCOUNTER — Other Ambulatory Visit: Payer: Self-pay

## 2022-04-19 DIAGNOSIS — R531 Weakness: Secondary | ICD-10-CM | POA: Diagnosis present

## 2022-04-19 DIAGNOSIS — E860 Dehydration: Secondary | ICD-10-CM | POA: Diagnosis present

## 2022-04-19 DIAGNOSIS — I252 Old myocardial infarction: Secondary | ICD-10-CM | POA: Diagnosis not present

## 2022-04-19 DIAGNOSIS — D696 Thrombocytopenia, unspecified: Secondary | ICD-10-CM | POA: Diagnosis not present

## 2022-04-19 DIAGNOSIS — G9341 Metabolic encephalopathy: Secondary | ICD-10-CM | POA: Diagnosis present

## 2022-04-19 DIAGNOSIS — G8929 Other chronic pain: Secondary | ICD-10-CM | POA: Diagnosis present

## 2022-04-19 DIAGNOSIS — R824 Acetonuria: Secondary | ICD-10-CM | POA: Diagnosis present

## 2022-04-19 DIAGNOSIS — M25551 Pain in right hip: Secondary | ICD-10-CM | POA: Diagnosis not present

## 2022-04-19 DIAGNOSIS — K219 Gastro-esophageal reflux disease without esophagitis: Secondary | ICD-10-CM | POA: Diagnosis present

## 2022-04-19 DIAGNOSIS — Z79899 Other long term (current) drug therapy: Secondary | ICD-10-CM | POA: Diagnosis not present

## 2022-04-19 DIAGNOSIS — R4182 Altered mental status, unspecified: Secondary | ICD-10-CM | POA: Diagnosis present

## 2022-04-19 DIAGNOSIS — D869 Sarcoidosis, unspecified: Secondary | ICD-10-CM | POA: Diagnosis not present

## 2022-04-19 DIAGNOSIS — Z6838 Body mass index (BMI) 38.0-38.9, adult: Secondary | ICD-10-CM | POA: Diagnosis not present

## 2022-04-19 DIAGNOSIS — Z951 Presence of aortocoronary bypass graft: Secondary | ICD-10-CM | POA: Diagnosis not present

## 2022-04-19 DIAGNOSIS — K766 Portal hypertension: Secondary | ICD-10-CM | POA: Diagnosis present

## 2022-04-19 DIAGNOSIS — Z7902 Long term (current) use of antithrombotics/antiplatelets: Secondary | ICD-10-CM | POA: Diagnosis not present

## 2022-04-19 DIAGNOSIS — I25118 Atherosclerotic heart disease of native coronary artery with other forms of angina pectoris: Secondary | ICD-10-CM | POA: Diagnosis not present

## 2022-04-19 DIAGNOSIS — R296 Repeated falls: Secondary | ICD-10-CM | POA: Diagnosis present

## 2022-04-19 DIAGNOSIS — M549 Dorsalgia, unspecified: Secondary | ICD-10-CM | POA: Diagnosis present

## 2022-04-19 DIAGNOSIS — G4733 Obstructive sleep apnea (adult) (pediatric): Secondary | ICD-10-CM | POA: Diagnosis present

## 2022-04-19 DIAGNOSIS — Z7982 Long term (current) use of aspirin: Secondary | ICD-10-CM | POA: Diagnosis not present

## 2022-04-19 DIAGNOSIS — Z7984 Long term (current) use of oral hypoglycemic drugs: Secondary | ICD-10-CM | POA: Diagnosis not present

## 2022-04-19 DIAGNOSIS — R7989 Other specified abnormal findings of blood chemistry: Secondary | ICD-10-CM | POA: Diagnosis present

## 2022-04-19 DIAGNOSIS — D6959 Other secondary thrombocytopenia: Secondary | ICD-10-CM | POA: Diagnosis present

## 2022-04-19 DIAGNOSIS — N179 Acute kidney failure, unspecified: Secondary | ICD-10-CM | POA: Diagnosis not present

## 2022-04-19 DIAGNOSIS — E1140 Type 2 diabetes mellitus with diabetic neuropathy, unspecified: Secondary | ICD-10-CM | POA: Diagnosis present

## 2022-04-19 DIAGNOSIS — E785 Hyperlipidemia, unspecified: Secondary | ICD-10-CM | POA: Diagnosis present

## 2022-04-19 DIAGNOSIS — F418 Other specified anxiety disorders: Secondary | ICD-10-CM | POA: Diagnosis not present

## 2022-04-19 DIAGNOSIS — D863 Sarcoidosis of skin: Secondary | ICD-10-CM | POA: Diagnosis present

## 2022-04-19 DIAGNOSIS — Z955 Presence of coronary angioplasty implant and graft: Secondary | ICD-10-CM | POA: Diagnosis not present

## 2022-04-19 DIAGNOSIS — I2510 Atherosclerotic heart disease of native coronary artery without angina pectoris: Secondary | ICD-10-CM | POA: Diagnosis not present

## 2022-04-19 DIAGNOSIS — I10 Essential (primary) hypertension: Secondary | ICD-10-CM | POA: Diagnosis present

## 2022-04-19 DIAGNOSIS — E519 Thiamine deficiency, unspecified: Secondary | ICD-10-CM | POA: Diagnosis present

## 2022-04-19 DIAGNOSIS — I959 Hypotension, unspecified: Secondary | ICD-10-CM | POA: Diagnosis present

## 2022-04-19 DIAGNOSIS — Z794 Long term (current) use of insulin: Secondary | ICD-10-CM | POA: Diagnosis not present

## 2022-04-19 DIAGNOSIS — R300 Dysuria: Secondary | ICD-10-CM | POA: Diagnosis present

## 2022-04-19 DIAGNOSIS — J90 Pleural effusion, not elsewhere classified: Secondary | ICD-10-CM | POA: Diagnosis present

## 2022-04-19 DIAGNOSIS — F4323 Adjustment disorder with mixed anxiety and depressed mood: Secondary | ICD-10-CM | POA: Diagnosis not present

## 2022-04-19 DIAGNOSIS — K7460 Unspecified cirrhosis of liver: Secondary | ICD-10-CM | POA: Diagnosis present

## 2022-04-19 LAB — BASIC METABOLIC PANEL
Anion gap: 10 (ref 5–15)
BUN: 27 mg/dL — ABNORMAL HIGH (ref 8–23)
CO2: 23 mmol/L (ref 22–32)
Calcium: 8 mg/dL — ABNORMAL LOW (ref 8.9–10.3)
Chloride: 101 mmol/L (ref 98–111)
Creatinine, Ser: 1.06 mg/dL (ref 0.61–1.24)
GFR, Estimated: >60 mL/min (ref >60)
Glucose, Bld: 77 mg/dL (ref 70–99)
Potassium: 4.3 mmol/L (ref 3.5–5.1)
Sodium: 134 mmol/L — ABNORMAL LOW (ref 135–145)

## 2022-04-19 LAB — CBC
HCT: 35.3 % — ABNORMAL LOW (ref 39.0–52.0)
Hemoglobin: 11.4 g/dL — ABNORMAL LOW (ref 13.0–17.0)
MCH: 34.7 pg — ABNORMAL HIGH (ref 26.0–34.0)
MCHC: 32.3 g/dL (ref 30.0–36.0)
MCV: 107.3 fL — ABNORMAL HIGH (ref 80.0–100.0)
Platelets: 63 10*3/uL — ABNORMAL LOW (ref 150–400)
RBC: 3.29 MIL/uL — ABNORMAL LOW (ref 4.22–5.81)
RDW: 17.8 % — ABNORMAL HIGH (ref 11.5–15.5)
WBC: 4.9 10*3/uL (ref 4.0–10.5)
nRBC: 0 % (ref 0.0–0.2)

## 2022-04-19 LAB — TROPONIN I (HIGH SENSITIVITY): Troponin I (High Sensitivity): 7 ng/L (ref <18)

## 2022-04-19 LAB — HIV ANTIBODY (ROUTINE TESTING W REFLEX): HIV Screen 4th Generation wRfx: NONREACTIVE

## 2022-04-19 MED ORDER — LACTULOSE 10 GM/15ML PO SOLN
10.0000 g | Freq: Once | ORAL | Status: AC
  Administered 2022-04-19: 10 g via ORAL
  Filled 2022-04-19: qty 30

## 2022-04-19 MED ORDER — LACTULOSE 10 GM/15ML PO SOLN
10.0000 g | Freq: Three times a day (TID) | ORAL | Status: DC
  Administered 2022-04-19 – 2022-04-24 (×16): 10 g via ORAL
  Filled 2022-04-19 (×16): qty 30

## 2022-04-19 MED ORDER — SODIUM CHLORIDE 0.9 % IV SOLN
2.0000 g | INTRAVENOUS | Status: DC
  Administered 2022-04-19 – 2022-04-22 (×4): 2 g via INTRAVENOUS
  Filled 2022-04-19 (×4): qty 20

## 2022-04-19 MED ORDER — THIAMINE HCL 100 MG/ML IJ SOLN
500.0000 mg | Freq: Three times a day (TID) | INTRAVENOUS | Status: AC
  Administered 2022-04-19 – 2022-04-20 (×6): 500 mg via INTRAVENOUS
  Filled 2022-04-19 (×6): qty 5

## 2022-04-19 MED ORDER — ORAL CARE MOUTH RINSE: 15.0000 mL | OROMUCOSAL | Status: DC | PRN

## 2022-04-19 MED ORDER — ACETAMINOPHEN 325 MG PO TABS
325.0000 mg | ORAL_TABLET | Freq: Once | ORAL | Status: AC
  Administered 2022-04-19: 325 mg via ORAL
  Filled 2022-04-19: qty 1

## 2022-04-19 MED ORDER — LACTULOSE ENEMA
300.0000 mL | Freq: Once | ORAL | Status: DC
  Filled 2022-04-19: qty 300

## 2022-04-19 MED ORDER — SODIUM CHLORIDE 0.9 % IV SOLN: INTRAVENOUS | Status: DC

## 2022-04-19 MED ORDER — ENOXAPARIN SODIUM 40 MG/0.4ML IJ SOSY
40.0000 mg | PREFILLED_SYRINGE | INTRAMUSCULAR | Status: DC
  Administered 2022-04-19 – 2022-04-20 (×2): 40 mg via SUBCUTANEOUS
  Filled 2022-04-19 (×2): qty 0.4

## 2022-04-19 MED ORDER — NALOXONE HCL 0.4 MG/ML IJ SOLN
0.2000 mg | Freq: Once | INTRAMUSCULAR | Status: AC
  Administered 2022-04-19: 0.2 mg via INTRAVENOUS
  Filled 2022-04-19: qty 1

## 2022-04-19 MED ORDER — LACTATED RINGERS IV SOLN: INTRAVENOUS | Status: DC

## 2022-04-19 MED ORDER — CLOPIDOGREL BISULFATE 75 MG PO TABS
75.0000 mg | ORAL_TABLET | Freq: Every day | ORAL | Status: DC
  Administered 2022-04-19 – 2022-04-24 (×6): 75 mg via ORAL
  Filled 2022-04-19 (×6): qty 1

## 2022-04-19 NOTE — Assessment & Plan Note (Signed)
-   Last hemoglobin A1c of 6.7 in February - Will hold on sliding scale for now as he is n.p.o. and lethargic

## 2022-04-19 NOTE — Assessment & Plan Note (Addendum)
-   Presented with profound lethargy and reportedly sent in by roommate for acute altered mental status.  Unable to elicit any history from patient. -Encephalopathy could be multifactorial from hepatic encephalopathy, thiamine deficiency and polypharmacy.  Per documentation, he recently moved back from [place] and was following with GI for cirrhosis-unclear cause of cirrhosis. ETOH is low here. Ammonia level is mildly elevated today at 54 which could be contributory.  He also was noted to have thiamine deficiency with level around 7 back in February and placed on supplementation but unclear if he has been taking this.  UDS positive for benzo and opioids.  He does have a short course of oxycodone prescribed for chronic back pain as he recently was evaluated in the ED for this.  No findings of benzodiazepine prescription on [place] database.  He also is on gabapentin. -No fever or leukocytosis. Negative CXR and UA.  -CT head is negative.  -will give one time dose of Narcan -will give one time dose of Lactulose enema -also start high dose Thiamine -continue to follow clinically. Able to maintain airway at this time.

## 2022-04-19 NOTE — Assessment & Plan Note (Signed)
Has AKI as well so likely hypovolemic -continuous IV fluid overnight and follow

## 2022-04-19 NOTE — Assessment & Plan Note (Signed)
-   Resume statin once he is able to tolerate p.o.

## 2022-04-19 NOTE — Assessment & Plan Note (Signed)
-  S/p CABG -Noted in documentation at bedside that he has echocardiogram and Myoview stress test planned with cardiology sometime in late March. -Troponin today is reassuring at 6

## 2022-04-19 NOTE — Plan of Care (Signed)

## 2022-04-19 NOTE — ED Notes (Signed)
ED TO INPATIENT HANDOFF REPORT  ED Nurse Name and Phone #: Doylene Bode, RN  S Name/Age/Gender Barry [name] 67 y.o. male Room/Bed: WA24/WA24  Code Status   Code Status: Full Code  Home/SNF/Other Home Patient oriented to: self Is this baseline? No   Triage Complete: Triage complete  Chief Complaint Acute metabolic encephalopathy [id number]  Triage Note PER EMS: pt 's friend called EMS due to the patient appearing very weak and lethargic. He arrives alert but very drowsy, unsure of onset. He reports dark urine for 3 days associated with dysuria.  He is confused and slow to answer to questions.   20g LFA, he received 200cc of NaCl pta  BP- 100/60, HR-75, O2-96% 2L [place]      Allergies No Known Allergies  Level of Care/Admitting Diagnosis ED Disposition     ED Disposition  Admit   Condition  --   Comment  Hospital Area: [place] [[id number]]  Level of Care: Progressive [102]  Admit to Progressive based on following criteria: Other see comments  Comments: encephalopathy  May admit patient to [facility] if equivalent level of care is available:: No  Covid Evaluation: Asymptomatic - no recent exposure (last 10 days) testing not required  Diagnosis: Acute metabolic encephalopathy [id number]  Admitting Physician: Orene Desanctis [id number]  Attending Physician: Orene Desanctis [id number]  Certification:: I certify this patient will need inpatient services for at least 2 midnights  Estimated Length of Stay: 2          B Medical/Surgery History Past Medical History:  Diagnosis Date   Coronary artery disease    Family history of colon cancer 10/15/2019   GERD (gastroesophageal reflux disease)    Hyperlipidemia    MI (myocardial infarction) ([place]) 04/23/2007   inferior wall   Morbid obesity ([place]) 06/26/2012   S/P CABG x 3 07/04/2012   LIMA to LAD, SVG to D1, SVG to PDA, EVH via right thigh   Sleep apnea    Type II or unspecified type diabetes  mellitus without mention of complication, not stated as uncontrolled    Unspecified essential hypertension    Past Surgical History:  Procedure Laterality Date   CORONARY ANGIOPLASTY WITH STENT PLACEMENT  04/23/2007   PCI and stenting of mid RCA - Dr Donnetta Hutching @ [facility]   CORONARY ARTERY BYPASS GRAFT N/A 07/04/2012   Procedure: CORONARY ARTERY BYPASS GRAFTING (CABG);  Surgeon: Rexene Alberts, MD;  Location: [place];  Service: Open Heart Surgery;  Laterality: N/A;  x3 using right greater saphenous vein and left internal mammary.    INTRAOPERATIVE TRANSESOPHAGEAL ECHOCARDIOGRAM N/A 07/04/2012   Procedure: INTRAOPERATIVE TRANSESOPHAGEAL ECHOCARDIOGRAM;  Surgeon: Rexene Alberts, MD;  Location: [place];  Service: Open Heart Surgery;  Laterality: N/A;   LEFT HEART CATH AND CORS/GRAFTS ANGIOGRAPHY N/A 04/18/2017   Procedure: LEFT HEART CATH AND CORS/GRAFTS ANGIOGRAPHY;  Surgeon: Lorretta Harp, MD;  Location: [place] CV LAB;  Service: Cardiovascular;  Laterality: N/A;   LEFT HEART CATHETERIZATION WITH CORONARY ANGIOGRAM N/A 06/25/2012   Procedure: LEFT HEART CATHETERIZATION WITH CORONARY ANGIOGRAM;  Surgeon: Lorretta Harp, MD;  Location: [facility] CATH LAB;  Service: Cardiovascular;  Laterality: N/A;   TOOTH EXTRACTION  04/2018   4 teeth pulled      A IV Location/Drains/Wounds Patient Lines/Drains/Airways Status     Active Line/Drains/Airways     Name Placement date Placement time Site Days   Peripheral IV 04/18/22 20 G Left Wrist 04/18/22  1837  Wrist  1   Peripheral IV 04/18/22 20 G Anterior;Left;Proximal Forearm 04/18/22  1845  Forearm  1   External Urinary Catheter 04/18/22  1857  --  1   Incision 07/04/12 Chest Other (Comment) 07/04/12  1007  -- 3576   Incision 07/04/12 Leg Right 07/04/12  1007  -- 3576            Intake/Output Last 24 hours No intake or output data in the 24 hours ending 04/19/22 0015  Labs/Imaging Results for orders placed or performed during the hospital encounter of  04/18/22 (from the past 48 hour(s))  Comprehensive metabolic panel     Status: Abnormal   Collection Time: 04/18/22  6:20 PM  Result Value Ref Range   Sodium 135 135 - 145 mmol/L   Potassium 4.2 3.5 - 5.1 mmol/L   Chloride 104 98 - 111 mmol/L   CO2 24 22 - 32 mmol/L   Glucose, Bld 78 70 - 99 mg/dL    Comment: Glucose reference range applies only to samples taken after fasting for at least 8 hours.   BUN 31 (H) 8 - 23 mg/dL   Creatinine, Ser 1.11 0.61 - 1.24 mg/dL   Calcium 8.1 (L) 8.9 - 10.3 mg/dL   Total Protein 7.3 6.5 - 8.1 g/dL   Albumin 3.1 (L) 3.5 - 5.0 g/dL   AST 158 (H) 15 - 41 U/L   ALT 120 (H) 0 - 44 U/L   Alkaline Phosphatase 134 (H) 38 - 126 U/L   Total Bilirubin 1.7 (H) 0.3 - 1.2 mg/dL   GFR, Estimated >60 >60 mL/min    Comment: (NOTE) Calculated using the CKD-EPI Creatinine Equation (2021)    Anion gap 7 5 - 15    Comment: Performed at [facility], [place] [address] [place], [place] [address]  Lactic acid, plasma     Status: None   Collection Time: 04/18/22  6:20 PM  Result Value Ref Range   Lactic Acid, Venous 1.2 0.5 - 1.9 mmol/L    Comment: Performed at [facility], [place] [address], [place], [place] [address]  CBC with Differential     Status: Abnormal   Collection Time: 04/18/22  6:20 PM  Result Value Ref Range   WBC 5.8 4.0 - 10.5 K/uL   RBC 3.48 (L) 4.22 - 5.81 MIL/uL   Hemoglobin 12.0 (L) 13.0 - 17.0 g/dL   HCT 36.9 (L) 39.0 - 52.0 %   MCV 106.0 (H) 80.0 - 100.0 fL   MCH 34.5 (H) 26.0 - 34.0 pg   MCHC 32.5 30.0 - 36.0 g/dL   RDW 17.7 (H) 11.5 - 15.5 %   Platelets 76 (L) 150 - 400 K/uL    Comment: SPECIMEN CHECKED FOR CLOTS Immature Platelet Fraction may be clinically indicated, consider ordering this additional test [id number] REPEATED TO VERIFY PLATELET COUNT CONFIRMED BY SMEAR    nRBC 0.0 0.0 - 0.2 %   Neutrophils Relative % 58 %   Neutro Abs 3.4 1.7 - 7.7 K/uL   Lymphocytes Relative 22 %   Lymphs Abs 1.3  0.7 - 4.0 K/uL   Monocytes Relative 16 %   Monocytes Absolute 0.9 0.1 - 1.0 K/uL   Eosinophils Relative 3 %   Eosinophils Absolute 0.2 0.0 - 0.5 K/uL   Basophils Relative 1 %   Basophils Absolute 0.1 0.0 - 0.1 K/uL   Immature Granulocytes 0 %   Abs Immature Granulocytes 0.02 0.00 - 0.07 K/uL  Comment: Performed at [facility], [place] [address], [place], [place] [address]  Protime-INR     Status: Abnormal   Collection Time: 04/18/22  6:20 PM  Result Value Ref Range   Prothrombin Time 15.6 (H) 11.4 - 15.2 seconds   INR 1.3 (H) 0.8 - 1.2    Comment: (NOTE) INR goal varies based on device and disease states. Performed at [facility], [place] [address] [place], [place] [address]   Acetaminophen level     Status: None   Collection Time: 04/18/22  6:20 PM  Result Value Ref Range   Acetaminophen (Tylenol), Serum 17 10 - 30 ug/mL    Comment: (NOTE) Therapeutic concentrations vary significantly. A range of 10-30 ug/mL  may be an effective concentration for many patients. However, some  are best treated at concentrations outside of this range. Acetaminophen concentrations >150 ug/mL at 4 hours after ingestion  and >50 ug/mL at 12 hours after ingestion are often associated with  toxic reactions.  Performed at [facility], [place] [address], [place], [place] [address]   Ethanol     Status: None   Collection Time: 04/18/22  6:20 PM  Result Value Ref Range   Alcohol, Ethyl (B) <10 <10 mg/dL    Comment: (NOTE) Lowest detectable limit for serum alcohol is 10 mg/dL.  For medical purposes only. Performed at [facility], [place] [address] [place], [place] [id number]   Salicylate level     Status: Abnormal   Collection Time: 04/18/22  6:20 PM  Result Value Ref Range   Salicylate Lvl [id number] (L) 7.0 - 30.0 mg/dL    Comment: Performed at [facility], [place] [address] [place], [place] [address]   Troponin I (High Sensitivity)     Status: None   Collection Time: 04/18/22  6:20 PM  Result Value Ref Range   Troponin I (High Sensitivity) 6 <18 ng/L    Comment: (NOTE) Elevated high sensitivity troponin I (hsTnI) values and significant  changes across serial measurements may suggest ACS but many other  chronic and acute conditions are known to elevate hsTnI results.  Refer to the "Links" section for chest pain algorithms and additional  guidance. Performed at [facility], [place] [address], [place], [place] [address]   Urinalysis, Routine w reflex microscopic -Urine, Clean Catch     Status: Abnormal   Collection Time: 04/18/22  6:40 PM  Result Value Ref Range   Color, Urine AMBER (A) YELLOW    Comment: BIOCHEMICALS MAY BE AFFECTED BY COLOR   APPearance CLEAR CLEAR   Specific Gravity, Urine 1.027 1.005 - 1.030   pH 5.0 5.0 - 8.0   Glucose, UA NEGATIVE NEGATIVE mg/dL   Hgb urine dipstick NEGATIVE NEGATIVE   Bilirubin Urine SMALL (A) NEGATIVE   Ketones, ur 5 (A) NEGATIVE mg/dL   Protein, ur 30 (A) NEGATIVE mg/dL   Nitrite NEGATIVE NEGATIVE   Leukocytes,Ua NEGATIVE NEGATIVE   RBC / HPF 0-5 0 - 5 RBC/hpf   WBC, UA 0-5 0 - 5 WBC/hpf   Bacteria, UA NONE SEEN NONE SEEN   Squamous Epithelial / HPF 0-5 0 - 5 /HPF   Mucus PRESENT    Hyaline Casts, UA PRESENT     Comment: Performed at [facility], [place] [address], [place], [place] [address]  Blood gas, venous (at [facility] and [facility])     Status: Abnormal   Collection Time: 04/18/22  6:40 PM  Result Value Ref Range   pH, Ven 7.4 7.25 - 7.43   pCO2, Ven 37 (L) 44 - 60 mmHg   pO2, Ven 130 (H) 32 - 45 mmHg   Bicarbonate 22.9 20.0 - 28.0 mmol/L   Acid-base deficit 1.5 0.0 - 2.0 mmol/L   O2 Saturation 99.9 %   Patient temperature 37.0     Comment: Performed at [facility], [place] [address] [place], [place] [address]  Urine rapid drug screen (hosp performed)     Status: Abnormal    Collection Time: 04/18/22  6:40 PM  Result Value Ref Range   Opiates POSITIVE (A) NONE DETECTED   Cocaine NONE DETECTED NONE DETECTED   Benzodiazepines POSITIVE (A) NONE DETECTED   Amphetamines NONE DETECTED NONE DETECTED   Tetrahydrocannabinol NONE DETECTED NONE DETECTED   Barbiturates NONE DETECTED NONE DETECTED    Comment: (NOTE) DRUG SCREEN FOR MEDICAL PURPOSES ONLY.  IF CONFIRMATION IS NEEDED FOR ANY PURPOSE, NOTIFY LAB WITHIN 5 DAYS.  LOWEST DETECTABLE LIMITS FOR URINE DRUG SCREEN Drug Class                     Cutoff (ng/mL) Amphetamine and metabolites    1000 Barbiturate and metabolites    200 Benzodiazepine                 200 Opiates and metabolites        300 Cocaine and metabolites        300 THC                            50 Performed at [facility], [place] [address] [place], [place] [address]   Ammonia     Status: Abnormal   Collection Time: 04/18/22  7:21 PM  Result Value Ref Range   Ammonia 54 (H) 9 - 35 umol/L    Comment: Performed at [facility], [place] [address], [place], [place] [address]  Troponin I (High Sensitivity)     Status: None   Collection Time: 04/18/22 11:26 PM  Result Value Ref Range   Troponin I (High Sensitivity) 7 <18 ng/L    Comment: (NOTE) Elevated high sensitivity troponin I (hsTnI) values and significant  changes across serial measurements may suggest ACS but many other  chronic and acute conditions are known to elevate hsTnI results.  Refer to the "Links" section for chest pain algorithms and additional  guidance. Performed at [facility], [place] [address] [place], [place] [address]    CT Head Wo Contrast  Result Date: 04/18/2022 CLINICAL DATA:  Mental status changes, unknown cause. EXAM: CT HEAD WITHOUT CONTRAST TECHNIQUE: Contiguous axial images were obtained from the base of the skull through the vertex without intravenous contrast. RADIATION DOSE REDUCTION: This exam was  performed according to the departmental dose-optimization program which includes automated exposure control, adjustment of the mA and/or kV according to patient size and/or use of iterative reconstruction technique. COMPARISON:  Prior head CTs are unavailable in PACS at this time. Comparison is made with report of the most recent head CTs dated 04/01/2020 and 04/06/2020 FINDINGS: Brain: Some images are motion limited with loss of fine detail. There is mild cerebral atrophy and small-vessel disease. The ventricles are normal in size and position. Cerebellum and brainstem are unremarkable. No old territorial infarct is seen. Allowing for motion no acute cortical based infarct, hemorrhage, or mass effect is evident. There  is no midline shift. The basal cisterns are clear. Vascular: There are patchy calcifications of the carotid siphons. No hyperdense central vessel is seen. Skull: Negative for fractures or focal lesions. Sinuses/Orbits: No acute orbital findings. Old left lens replacement. A right globe prosthesis. Clear sinuses and mastoid air cells. Other: None. IMPRESSION: No acute intracranial CT findings. Mild atrophy and small-vessel disease. Carotid atherosclerosis. Electronically Signed   By: Telford Nab M.D.   On: 04/18/2022 20:21   DG Chest Port 1 View  Result Date: 04/18/2022 CLINICAL DATA:  Sepsis. Weak and lethargic. Dysuria and dark urine for 3 days. EXAM: PORTABLE CHEST 1 VIEW COMPARISON:  12/30/2021 FINDINGS: Postoperative changes in the mediastinum. Shallow inspiration. Heart size and pulmonary vascularity are normal for technique. Probable small bilateral pleural effusions with basilar atelectasis. No pneumothorax. Mediastinal contours appear intact. IMPRESSION: Shallow inspiration. Probable small pleural effusions with basilar atelectasis. Electronically Signed   By: Lucienne Capers M.D.   On: 04/18/2022 19:32    Pending Labs Unresulted Labs (From admission, onward)     Start     Ordered    04/19/22 0500  CBC  Tomorrow morning,   R        04/19/22 0004   04/19/22 [id number]  Basic metabolic panel  Tomorrow morning,   R        04/19/22 0004   04/19/22 0003  HIV Antibody (routine testing w rflx)  (HIV Antibody (Routine testing w reflex) panel)  Once,   R        04/19/22 0004   04/18/22 1813  Culture, blood (Routine x 2)  BLOOD CULTURE X 2,   R      04/18/22 1812            Vitals/Pain Today's Vitals   04/18/22 2030 04/18/22 2045 04/18/22 2100 04/18/22 2200  BP: 124/73 110/62  108/61  Pulse: 94 95  88  Resp: 11 13  19   Temp:   97.7 F (36.5 C)   SpO2: 95% 91%  96%  Weight:      Height:      PainSc:        Isolation Precautions No active isolations  Medications Medications  lactated ringers infusion ( Intravenous New Bag/Given 04/18/22 1950)  enoxaparin (LOVENOX) injection 40 mg (has no administration in time range)  lactulose (CHRONULAC) enema 200 gm (has no administration in time range)  thiamine (VITAMIN B1) 500 mg in sodium chloride 0.9 % 50 mL IVPB (has no administration in time range)    Mobility non-ambulatory     Focused Assessments Neuro Assessment Handoff:       R Recommendations: See Admitting Provider Note  Report given to:   Additional Notes:

## 2022-04-19 NOTE — Progress Notes (Signed)
Pt admitted to 4W from ED. Pt was initially  somnolent in ED but after receiving narcan dose pt aroused and is now A&Ox4 w/ GCS:15. On arrival to unit pt having c/o hip pain. Order for tylenol obtained. Pt also had lactulose enema scheduled, but due to patients improved mentation lactulose switched to PO and advance diet order obtained as well. Pt oriented to unit and verbalizes agreement with fall risk prevention strategies. Call bell within reach.

## 2022-04-19 NOTE — Assessment & Plan Note (Signed)
-   Creatinine elevated 1.11 from prior of 0.81 - Keep on continuous IV fluids overnight - Avoid nephrotoxic agent

## 2022-04-19 NOTE — Progress Notes (Signed)
PROGRESS NOTE    Barry [name]  [id number] DOB: Jan 31, 1955 DOA: 04/18/2022 PCP: Janith Lima, MD   Brief Narrative:  67 y.o. male with medical history significant of CAD s/p CAGB x3, HTN, HLD, cirrhosis, insulin dependent T2DM, OSA, obesity who presents with AMS. Patient admitted with change in mental status, lethargy, dark urine, generalized weakness.  Friend called EMS as patient appeared weak and lethargic.  He reported dark urine and dysuria. In the ER his blood pressure was 92/60, afebrile, on room air, not tachycardic.  Labs were significant for chronic thrombocytopenia, no leukocytosis, sodium 135, creatinine 1.11 up from baseline of 0.81.  Elevated AST ALT 158 and 120 respectively.  Ammonia level was 54.  pH was 7.4.  UA showed hyaline cast, ketones and proteins.  UDS was positive for benzos and opioids.  Tylenol level was 17 alcohol less than 10.   CT of the head shows no acute findings.  Mild atrophy and small vessel disease.  Old left lens replacement.  Right globe prosthesis.  Clear sinuses.  Endoscopy in May 2023 with portal hypertensive gastropathy and grade 1 varices.  Home meds include BuSpar, Flexeril, gabapentin, Zoloft. Assessment & Plan:   Principal Problem:   Acute metabolic encephalopathy Active Problems:   CAD (coronary artery disease)   Hyperlipidemia LDL goal <70   Type 2 diabetes mellitus with diabetic neuropathy, with long-term current use of insulin (HCC)   Cirrhosis of liver without ascites (HCC)   Hypotension   AKI (acute kidney injury) ([place])   #1 acute metabolic encephalopathy-unclear cause Possible etiologies include dehydration with ketones in the urine and elevated creatinine and dark urine Urine drug screen was positive for benzos and opioids though this is not in his med list. UA negative for leukocytes and nitrites WBCs or bacteria.  No evidence of urinary tract infection. Mildly low B1 7 Normal TSH B12 and folate last month B12 805, folate  13.6 CT head no acute findings Chest x-ray small bilateral pleural effusion Ammonia level is 54 possibly not causing his change in mental status For now continue IV fluids recheck labs in a.m.  Hold any medications that could interfere with his mental status  On high-dose B1 continue for 2 days If no improvement will do CT of the abdomen and pelvis Will start Rocephin for empiric intra-abdominal coverage Start lactulose  #2 history of cirrhosis  EGD in the past shows portal gastropathy and grade 1 varices. Elevated AST and ALT Check hepatitis panel Patient is not on lactulose prior to admission  #3 type 2 diabetes on metformin 1000 mg twice a day hold this for now till all the workup is completed meanwhile continue SSI  CBG (last 3)  No results for input(s): "GLUCAP" in the last 72 hours.  #4 history of essential hypertension patient takes lisinopril 5 mg daily and metoprolol 12.5 mg twice daily, holding this for now with soft blood pressure    #5 AKI continue IV fluids hold metformin and lisinopril  #6 hyperlipidemia on Lipitor at home on hold due to elevated LFTs    #7 CAD with history of CABG on aspirin Lipitor lisinopril prior to admission.  With Lopressor.  Restart Plavix.   #8 chronic thrombocytopenia monitor on Lovenox  Estimated body mass index is 38.15 kg/m as calculated from the following:   Height as of this encounter: 5\' 6"  (1.676 m).   Weight as of this encounter: 107.2 kg.  DVT prophylaxis: Lovenox Code Status: full Family Communication: None at  bedside  disposition Plan:  Status is: Inpatient Remains inpatient appropriate because: Acute encephalopathy   Consultants: None  Procedures: None Antimicrobials: Rocephin  Subjective: Patient is resting in bed he appears chronically ill He answers appropriately but falls right back to sleep Complaining of back pain  Objective: Vitals:   04/18/22 2200 04/19/22 0045 04/19/22 0120 04/19/22 0410  BP: 108/61  111/73 136/63 126/69  Pulse: 88 74 89 87  Resp: 19 13 18 17   Temp:  97.7 F (36.5 C) 97.8 F (36.6 C) 98 F (36.7 C)  TempSrc:   Oral Oral  SpO2: 96% 98% 98% 95%  Weight:    107.2 kg  Height:    5\' 6"  (1.676 m)    Intake/Output Summary (Last 24 hours) at 04/19/2022 0840 Last data filed at 04/19/2022 0703 Gross per 24 hour  Intake 1741.1 ml  Output 575 ml  Net 1166.1 ml   Filed Weights   04/18/22 1810 04/19/22 0410  Weight: 105.7 kg 107.2 kg    Examination:  General exam: Appears chronically ill, in no acute distress respiratory system: Clear to auscultation. Respiratory effort normal. Cardiovascular system: S1 & S2 heard, RRR. No JVD, murmurs, rubs, gallops or clicks. No pedal edema. Gastrointestinal system: Abdomen is nondistended, soft and nontender. No organomegaly or masses felt. Normal bowel sounds heard. Central nervous system: Able to answer my questions appropriately even though very slow in answering and responding, falls right back to sleep. Extremities: 2+ pitting edema Psychiatry: Unable to assess   Data Reviewed: I have personally reviewed following labs and imaging studies  CBC: Recent Labs  Lab 04/18/22 1820 04/19/22 0238  WBC 5.8 4.9  NEUTROABS 3.4  --   HGB 12.0* 11.4*  HCT 36.9* 35.3*  MCV 106.0* 107.3*  PLT 76* 63*   Basic Metabolic Panel: Recent Labs  Lab 04/18/22 1820 04/19/22 0238  NA 135 134*  K 4.2 4.3  CL 104 101  CO2 24 23  GLUCOSE 78 77  BUN 31* 27*  CREATININE 1.11 1.06  CALCIUM 8.1* 8.0*   GFR: Estimated Creatinine Clearance: 80.9 mL/min (by C-G formula based on SCr of 1.06 mg/dL). Liver Function Tests: Recent Labs  Lab 04/18/22 1820  AST 158*  ALT 120*  ALKPHOS 134*  BILITOT 1.7*  PROT 7.3  ALBUMIN 3.1*   No results for input(s): "LIPASE", "AMYLASE" in the last 168 hours. Recent Labs  Lab 04/18/22 1921  AMMONIA 54*   Coagulation Profile: Recent Labs  Lab 04/18/22 1820  INR 1.3*   Cardiac Enzymes: No  results for input(s): "CKTOTAL", "CKMB", "CKMBINDEX", "TROPONINI" in the last 168 hours. BNP (last 3 results) No results for input(s): "PROBNP" in the last 8760 hours. HbA1C: No results for input(s): "HGBA1C" in the last 72 hours. CBG: No results for input(s): "GLUCAP" in the last 168 hours. Lipid Profile: No results for input(s): "CHOL", "HDL", "LDLCALC", "TRIG", "CHOLHDL", "LDLDIRECT" in the last 72 hours. Thyroid Function Tests: No results for input(s): "TSH", "T4TOTAL", "FREET4", "T3FREE", "THYROIDAB" in the last 72 hours. Anemia Panel: No results for input(s): "VITAMINB12", "FOLATE", "FERRITIN", "TIBC", "IRON", "RETICCTPCT" in the last 72 hours. Sepsis Labs: Recent Labs  Lab 04/18/22 1820  LATICACIDVEN 1.2    Recent Results (from the past 240 hour(s))  Culture, blood (Routine x 2)     Status: None (Preliminary result)   Collection Time: 04/18/22  6:20 PM   Specimen: BLOOD  Result Value Ref Range Status   Specimen Description   Final    BLOOD BLOOD  RIGHT WRIST Performed at [facility], [place] [address], [place], [place] [address]    Special Requests   Final    BOTTLES DRAWN AEROBIC AND ANAEROBIC Blood Culture adequate volume Performed at [facility] [address] [place], [place] [address]    Culture   Final    NO GROWTH < 12 HOURS Performed at [facility] [address], [place], [place] [address]    Report Status PENDING  Incomplete  Culture, blood (Routine x 2)     Status: None (Preliminary result)   Collection Time: 04/18/22  6:49 PM   Specimen: BLOOD  Result Value Ref Range Status   Specimen Description   Final    BLOOD BLOOD LEFT FOREARM Performed at [facility] [address], [place], [place] [address]    Special Requests   Final    BOTTLES DRAWN AEROBIC AND ANAEROBIC Blood Culture adequate volume Performed at [facility] [address], [place], [place] [address]     Culture   Final    NO GROWTH < 12 HOURS Performed at [facility] [address] [place], [place] [address]    Report Status PENDING  Incomplete         Radiology Studies: CT Head Wo Contrast  Result Date: 04/18/2022 CLINICAL DATA:  Mental status changes, unknown cause. EXAM: CT HEAD WITHOUT CONTRAST TECHNIQUE: Contiguous axial images were obtained from the base of the skull through the vertex without intravenous contrast. RADIATION DOSE REDUCTION: This exam was performed according to the departmental dose-optimization program which includes automated exposure control, adjustment of the mA and/or kV according to patient size and/or use of iterative reconstruction technique. COMPARISON:  Prior head CTs are unavailable in PACS at this time. Comparison is made with report of the most recent head CTs dated 04/01/2020 and 04/06/2020 FINDINGS: Brain: Some images are motion limited with loss of fine detail. There is mild cerebral atrophy and small-vessel disease. The ventricles are normal in size and position. Cerebellum and brainstem are unremarkable. No old territorial infarct is seen. Allowing for motion no acute cortical based infarct, hemorrhage, or mass effect is evident. There is no midline shift. The basal cisterns are clear. Vascular: There are patchy calcifications of the carotid siphons. No hyperdense central vessel is seen. Skull: Negative for fractures or focal lesions. Sinuses/Orbits: No acute orbital findings. Old left lens replacement. A right globe prosthesis. Clear sinuses and mastoid air cells. Other: None. IMPRESSION: No acute intracranial CT findings. Mild atrophy and small-vessel disease. Carotid atherosclerosis. Electronically Signed   By: Telford Nab M.D.   On: 04/18/2022 20:21   DG Chest Port 1 View  Result Date: 04/18/2022 CLINICAL DATA:  Sepsis. Weak and lethargic. Dysuria and dark urine for 3 days. EXAM: PORTABLE CHEST 1 VIEW COMPARISON:  12/30/2021 FINDINGS:  Postoperative changes in the mediastinum. Shallow inspiration. Heart size and pulmonary vascularity are normal for technique. Probable small bilateral pleural effusions with basilar atelectasis. No pneumothorax. Mediastinal contours appear intact. IMPRESSION: Shallow inspiration. Probable small pleural effusions with basilar atelectasis. Electronically Signed   By: Lucienne Capers M.D.   On: 04/18/2022 19:32        Scheduled Meds:  enoxaparin (LOVENOX) injection  40 mg Subcutaneous Q24H   Continuous Infusions:  lactated ringers 200 mL/hr at 04/19/22 0806   thiamine (VITAMIN B1) injection       LOS: 0 days    Time spent: 37 minutes  Noland Fordyce  Rodena Piety, MD 04/19/2022, 8:40 AM

## 2022-04-19 NOTE — Plan of Care (Signed)

## 2022-04-19 NOTE — Assessment & Plan Note (Addendum)
-   No ascites - AST and ALT have worsened compared to prior of the 8 and 120 - Ammonia level also elevated which could be contributory to his encephalopathy.  Will be given lactulose enema x 1. -Had endoscopy on 06/17/2021 with Grade I varices and moderate portal hypertensive gastropathy.

## 2022-04-20 DIAGNOSIS — G9341 Metabolic encephalopathy: Secondary | ICD-10-CM | POA: Diagnosis not present

## 2022-04-20 LAB — CBC
HCT: 35.6 % — ABNORMAL LOW (ref 39.0–52.0)
Hemoglobin: 11.3 g/dL — ABNORMAL LOW (ref 13.0–17.0)
MCH: 34.7 pg — ABNORMAL HIGH (ref 26.0–34.0)
MCHC: 31.7 g/dL (ref 30.0–36.0)
MCV: 109.2 fL — ABNORMAL HIGH (ref 80.0–100.0)
Platelets: 52 10*3/uL — ABNORMAL LOW (ref 150–400)
RBC: 3.26 MIL/uL — ABNORMAL LOW (ref 4.22–5.81)
RDW: 17.6 % — ABNORMAL HIGH (ref 11.5–15.5)
WBC: 2.5 10*3/uL — ABNORMAL LOW (ref 4.0–10.5)
nRBC: 0 % (ref 0.0–0.2)

## 2022-04-20 LAB — COMPREHENSIVE METABOLIC PANEL
ALT: 103 U/L — ABNORMAL HIGH (ref 0–44)
AST: 190 U/L — ABNORMAL HIGH (ref 15–41)
Albumin: 2.8 g/dL — ABNORMAL LOW (ref 3.5–5.0)
Alkaline Phosphatase: 126 U/L (ref 38–126)
Anion gap: 7 (ref 5–15)
BUN: 16 mg/dL (ref 8–23)
CO2: 25 mmol/L (ref 22–32)
Calcium: 8.1 mg/dL — ABNORMAL LOW (ref 8.9–10.3)
Chloride: 104 mmol/L (ref 98–111)
Creatinine, Ser: 0.86 mg/dL (ref 0.61–1.24)
GFR, Estimated: >60 mL/min (ref >60)
Glucose, Bld: 125 mg/dL — ABNORMAL HIGH (ref 70–99)
Potassium: 4.4 mmol/L (ref 3.5–5.1)
Sodium: 136 mmol/L (ref 135–145)
Total Bilirubin: 0.9 mg/dL (ref 0.3–1.2)
Total Protein: 6.7 g/dL (ref 6.5–8.1)

## 2022-04-20 LAB — AMMONIA: Ammonia: 46 umol/L — ABNORMAL HIGH (ref 9–35)

## 2022-04-20 LAB — GLUCOSE, CAPILLARY
Glucose-Capillary: 146 mg/dL — ABNORMAL HIGH (ref 70–99)
Glucose-Capillary: 131 mg/dL — ABNORMAL HIGH (ref 70–99)

## 2022-04-20 MED ORDER — BUSPIRONE HCL 5 MG PO TABS: 15.0000 mg | ORAL_TABLET | Freq: Three times a day (TID) | ORAL | Status: DC

## 2022-04-20 MED ORDER — GABAPENTIN 300 MG PO CAPS
600.0000 mg | ORAL_CAPSULE | Freq: Three times a day (TID) | ORAL | Status: DC
  Administered 2022-04-20 – 2022-04-24 (×12): 600 mg via ORAL
  Filled 2022-04-20 (×12): qty 2

## 2022-04-20 MED ORDER — INSULIN ASPART 100 UNIT/ML IJ SOLN
0.0000 [IU] | Freq: Three times a day (TID) | INTRAMUSCULAR | Status: DC
  Administered 2022-04-20: 2 [IU] via SUBCUTANEOUS
  Administered 2022-04-21: 5 [IU] via SUBCUTANEOUS
  Administered 2022-04-21: 3 [IU] via SUBCUTANEOUS
  Administered 2022-04-22: 2 [IU] via SUBCUTANEOUS
  Administered 2022-04-22 – 2022-04-24 (×3): 3 [IU] via SUBCUTANEOUS

## 2022-04-20 MED ORDER — INSULIN ASPART 100 UNIT/ML IJ SOLN: 0.0000 [IU] | Freq: Every day | INTRAMUSCULAR | Status: DC

## 2022-04-20 MED ORDER — METOPROLOL TARTRATE 25 MG PO TABS: 12.5000 mg | ORAL_TABLET | Freq: Two times a day (BID) | ORAL | Status: DC

## 2022-04-20 MED ORDER — SERTRALINE HCL 100 MG PO TABS
100.0000 mg | ORAL_TABLET | Freq: Every day | ORAL | Status: DC
  Administered 2022-04-21 – 2022-04-24 (×4): 100 mg via ORAL
  Filled 2022-04-20 (×4): qty 1

## 2022-04-20 NOTE — Progress Notes (Signed)
Tele called stated pt had 2.4 second pause. Pt was easy to wake up.  Notified provider.

## 2022-04-20 NOTE — Evaluation (Signed)
Physical Therapy Evaluation Patient Details Name: Barry [name] MRN: [id number] DOB: [birth year] Today's Date: 04/20/2022  History of Present Illness  67 y.o. male with medical history significant of CAD s/p CAGB x3, HTN, HLD, cirrhosis, insulin dependent T2DM, OSA, obesity, R eye prosthesis who presents with AMS, lethargy. UDS positive for benzos and opiods. Dx of acute metabolic encephalopathy, AKI.  Clinical Impression  Pt admitted with above diagnosis. Mod assist for supine to sit, min assist to stand from elevated bed with RW and take a few pivotal steps to recliner. Uncontrolled descent to recliner. Pt reports he has "nowhere to go" upon DC from hospital. He was staying with a friend prior to admission but cannot navigate the flight of stairs there. Pt is hopeful he can DC to [facility].  Pt currently with functional limitations due to the deficits listed below (see PT Problem List). Pt will benefit from acute skilled PT to increase their independence and safety with mobility to allow discharge.          Recommendations for follow up therapy are one component of a multi-disciplinary discharge planning process, led by the attending physician.  Recommendations may be updated based on patient status, additional functional criteria and insurance authorization.  Follow Up Recommendations Can patient physically be transported by private vehicle: No     Assistance Recommended at Discharge Frequent or constant Supervision/Assistance  Patient can return home with the following  A lot of help with walking and/or transfers;A lot of help with bathing/dressing/bathroom;Assist for transportation;Help with stairs or ramp for entrance;Assistance with cooking/housework    Equipment Recommendations Wheelchair (measurements PT);Wheelchair cushion (measurements PT)  Recommendations for Other Services       Functional Status Assessment Patient has had a recent decline in their functional status and demonstrates  the ability to make significant improvements in function in a reasonable and predictable amount of time.     Precautions / Restrictions Precautions Precautions: Fall Restrictions Weight Bearing Restrictions: No      Mobility  Bed Mobility Overal bed mobility: Needs Assistance Bed Mobility: Supine to Sit     Supine to sit: Mod assist, HOB elevated     General bed mobility comments: assist to raise trunk, increased time    Transfers Overall transfer level: Needs assistance Equipment used: Rolling walker (2 wheels) Transfers: Sit to/from Stand, Bed to chair/wheelchair/BSC Sit to Stand: From elevated surface, Min assist   Step pivot transfers: Min assist       General transfer comment: assist to power up, VCs hand placement, poorly controlled descent to chair    Ambulation/Gait                  Stairs            Wheelchair Mobility    Modified Rankin (Stroke Patients Only)       Balance Overall balance assessment: Needs assistance Sitting-balance support: Feet supported, No upper extremity supported Sitting balance-Leahy Scale: Fair     Standing balance support: Bilateral upper extremity supported, During functional activity, Reliant on assistive device for balance Standing balance-Leahy Scale: Poor                               Pertinent Vitals/Pain Pain Assessment Pain Assessment: Faces Pain Score: 4  Pain Location: back Pain Descriptors / Indicators: Discomfort Pain Intervention(s): Limited activity within patient's tolerance, Monitored during session    Home Living Family/patient expects to be discharged to::  Unsure                 Home Equipment: Rollator (4 wheels);Cane - quad Additional Comments: "I don't have anywhere to go". Pt moved her 3 months ago from [facility] and was staying with a friend who as a condo with a flight of stairs, pt was unable to navigate stairs there. Pt is hoping to go to [facility] upon acute DC.     Prior Function Prior Level of Function : Needs assist             Mobility Comments: pt reports he was using rollator prior to admission but with great difficulty and wasn't able to walk far       Hand Dominance        Extremity/Trunk Assessment   Upper Extremity Assessment Upper Extremity Assessment: Defer to OT evaluation    Lower Extremity Assessment Lower Extremity Assessment: Generalized weakness;LLE deficits/detail;RLE deficits/detail RLE Deficits / Details: pt reports his hip is "ruptured and needs surgery", knee ext 4/5 RLE Sensation: WNL LLE Deficits / Details: knee ext 4/5 LLE Sensation: WNL    Cervical / Trunk Assessment Cervical / Trunk Assessment: Normal (rounded shoulders)  Communication   Communication: No difficulties  Cognition Arousal/Alertness: Awake/alert Behavior During Therapy: WFL for tasks assessed/performed Overall Cognitive Status: Within Functional Limits for tasks assessed                                          General Comments      Exercises     Assessment/Plan    PT Assessment Patient needs continued PT services  PT Problem List Decreased mobility;Decreased activity tolerance;Decreased balance;Decreased strength       PT Treatment Interventions Gait training;Therapeutic activities;Functional mobility training;Patient/family education    PT Goals (Current goals can be found in the Care Plan section)  Acute Rehab PT Goals Patient Stated Goal: to get stronger PT Goal Formulation: With patient Time For Goal Achievement: 05/04/22 Potential to Achieve Goals: Fair    Frequency Min 2X/week     Co-evaluation               AM-PAC PT "6 Clicks" Mobility  Outcome Measure Help needed turning from your back to your side while in a flat bed without using bedrails?: A Little Help needed moving from lying on your back to sitting on the side of a flat bed without using bedrails?: A Lot Help needed moving to  and from a bed to a chair (including a wheelchair)?: A Little Help needed standing up from a chair using your arms (e.g., wheelchair or bedside chair)?: A Lot Help needed to walk in hospital room?: Total Help needed climbing 3-5 steps with a railing? : Total 6 Click Score: 12    End of Session Equipment Utilized During Treatment: Gait belt Activity Tolerance: Patient limited by fatigue Patient left: in chair;with chair alarm set;with call bell/phone within reach Nurse Communication: Mobility status PT Visit Diagnosis: Difficulty in walking, not elsewhere classified (R26.2);Muscle weakness (generalized) (M62.81);Adult, failure to thrive (R62.7)    Time: 1042-1100 PT Time Calculation (min) (ACUTE ONLY): 18 min   Charges:   PT Evaluation $PT Eval Moderate Complexity: 1 Mod          Philomena Doheny PT 04/20/2022  Acute Rehabilitation Services  Office [phone]

## 2022-04-20 NOTE — Progress Notes (Addendum)
PROGRESS NOTE    Barry [name]  [id number] DOB: Dec 06, 1955 DOA: 04/18/2022 PCP: Janith Lima, MD   Brief Narrative:  67 y.o. male with medical history significant of CAD s/p CAGB x3, HTN, HLD, cirrhosis, insulin dependent T2DM, OSA, obesity who presents with AMS. Patient admitted with change in mental status, lethargy, dark urine, generalized weakness.  Friend called EMS as patient appeared weak and lethargic.  He reported dark urine and dysuria. In the ER his blood pressure was 92/60, afebrile, on room air, not tachycardic.  Labs were significant for chronic thrombocytopenia, no leukocytosis, sodium 135, creatinine 1.11 up from baseline of 0.81.  Elevated AST ALT 158 and 120 respectively.  Ammonia level was 54.  pH was 7.4.  UA showed hyaline cast, ketones and proteins.  UDS was positive for benzos and opioids.  Tylenol level was 17 alcohol less than 10.   CT of the head shows no acute findings.  Mild atrophy and small vessel disease.  Old left lens replacement.  Right globe prosthesis.  Clear sinuses.  Endoscopy in May 2023 with portal hypertensive gastropathy and grade 1 varices.  Home meds include BuSpar, Flexeril, gabapentin, Zoloft. Assessment & Plan:   Principal Problem:   Acute metabolic encephalopathy Active Problems:   CAD (coronary artery disease)   Hyperlipidemia LDL goal <70   Type 2 diabetes mellitus with diabetic neuropathy, with long-term current use of insulin (HCC)   Cirrhosis of liver without ascites (HCC)   Hypotension   AKI (acute kidney injury) ([place])   #1 Acute metabolic encephalopathy-unclear cause Possible etiologies include dehydration with ketones in the urine and elevated creatinine and dark urine and thiamine deficiency Urine drug screen was positive for benzos and opioids  UA negative for leukocytes and nitrites WBCs or bacteria.  No evidence of urinary tract infection. low B1 7 continue high-dose thiamine Normal TSH B12 and folate last month B12 805,  folate 13.6 CT head no acute findings Chest x-ray small bilateral pleural effusion Ammonia level is 54 possibly not causing his change in mental status On high-dose B1 continue for 2 days  #2 history of cirrhosis  EGD in the past shows portal gastropathy and grade 1 varices. Elevated AST and ALT Check hepatitis panel Patient is not on lactulose prior to admission  #3 type 2 diabetes on metformin 1000 mg twice a day prior to admission.  Continue SSI CBG (last 3)  No results for input(s): "GLUCAP" in the last 72 hours.  #4 history of essential hypertension patient takes lisinopril 5 mg daily and metoprolol 12.5 mg twice daily   #5 AKI -resolved with IV fluids and holding metformin and ACE inhibitor.  #6 hyperlipidemia on Lipitor at home on hold due to elevated LFTs   #7 CAD with history of CABG on aspirin Lipitor lisinopril prior to admission.  With Lopressor.  Restart Plavix.   #8 chronic thrombocytopenia secondary to cirrhosis Platelets 52 from 63 will DC Lovenox. Hold asa   #9 right hip pain DJD-he tells me he is scheduled to have right hip replacement next week. He saw Dr. Gwenlyn Found for preop and has cleared him for surgery.  #sarcoidosis -he tells me he has been diagnosed with sarcoidosis he had a biopsy of his skin from the right back area 3 weeks ago.  Estimated body mass index is 38.15 kg/m as calculated from the following:   Height as of this encounter: 5\' 6"  (1.676 m).   Weight as of this encounter: 107.2 kg.  DVT prophylaxis:  Lovenox Code Status: full Family Communication: None at bedside  disposition Plan:  Status is: Inpatient Remains inpatient appropriate because: Acute encephalopathy   Consultants: None  Procedures: None Antimicrobials: Rocephin  Subjective: Is more awake today.  He was able to share his paper medical records with me.  He complains of right hip pain which has been there for 3 years.  He had biopsy of the skin from the right back area where is  tender.  He is feels weak will not be able to climb 3 steps at home requesting to go to a rehab facility  Objective: Vitals:   04/19/22 0929 04/19/22 1254 04/19/22 2033 04/20/22 0635  BP: 113/61 127/69 (!) 142/73 (!) 167/60  Pulse: 71 83 85 66  Resp:  18 18 18   Temp: 98 F (36.7 C) 98.1 F (36.7 C) 98.7 F (37.1 C) 98.8 F (37.1 C)  TempSrc: Axillary Oral Oral Oral  SpO2: 95% 95% 93% 93%  Weight:      Height:        Intake/Output Summary (Last 24 hours) at 04/20/2022 1314 Last data filed at 04/20/2022 1100 Gross per 24 hour  Intake 2006.29 ml  Output 3575 ml  Net -1568.71 ml    Filed Weights   04/18/22 1810 04/19/22 0410  Weight: 105.7 kg 107.2 kg    Examination:  General exam: Appears chronically ill, in no acute distress respiratory system: Clear to auscultation. Respiratory effort normal. Cardiovascular system: S1 & S2 heard, RRR. No JVD, murmurs, rubs, gallops or clicks. No pedal edema. Gastrointestinal system: Abdomen is nondistended, soft and nontender. No organomegaly or masses felt. Normal bowel sounds heard. Central nervous system: Awake alert able to answer questions appropriately follow commands Extremities: 2+ pitting edema   Data Reviewed: I have personally reviewed following labs and imaging studies  CBC: Recent Labs  Lab 04/18/22 1820 04/19/22 0238 04/20/22 0423  WBC 5.8 4.9 2.5*  NEUTROABS 3.4  --   --   HGB 12.0* 11.4* 11.3*  HCT 36.9* 35.3* 35.6*  MCV 106.0* 107.3* 109.2*  PLT 76* 63* 52*    Basic Metabolic Panel: Recent Labs  Lab 04/18/22 1820 04/19/22 0238 04/20/22 0423  NA 135 134* 136  K 4.2 4.3 4.4  CL 104 101 104  CO2 24 23 25   GLUCOSE 78 77 125*  BUN 31* 27* 16  CREATININE 1.11 1.06 0.86  CALCIUM 8.1* 8.0* 8.1*    GFR: Estimated Creatinine Clearance: 99.7 mL/min (by C-G formula based on SCr of 0.86 mg/dL). Liver Function Tests: Recent Labs  Lab 04/18/22 1820 04/20/22 0423  AST 158* 190*  ALT 120* 103*  ALKPHOS  134* 126  BILITOT 1.7* 0.9  PROT 7.3 6.7  ALBUMIN 3.1* 2.8*    No results for input(s): "LIPASE", "AMYLASE" in the last 168 hours. Recent Labs  Lab 04/18/22 1921 04/20/22 0423  AMMONIA 54* 46*    Coagulation Profile: Recent Labs  Lab 04/18/22 1820  INR 1.3*    Cardiac Enzymes: No results for input(s): "CKTOTAL", "CKMB", "CKMBINDEX", "TROPONINI" in the last 168 hours. BNP (last 3 results) No results for input(s): "PROBNP" in the last 8760 hours. HbA1C: No results for input(s): "HGBA1C" in the last 72 hours. CBG: No results for input(s): "GLUCAP" in the last 168 hours. Lipid Profile: No results for input(s): "CHOL", "HDL", "LDLCALC", "TRIG", "CHOLHDL", "LDLDIRECT" in the last 72 hours. Thyroid Function Tests: No results for input(s): "TSH", "T4TOTAL", "FREET4", "T3FREE", "THYROIDAB" in the last 72 hours. Anemia Panel: No results for  input(s): "VITAMINB12", "FOLATE", "FERRITIN", "TIBC", "IRON", "RETICCTPCT" in the last 72 hours. Sepsis Labs: Recent Labs  Lab 04/18/22 1820  LATICACIDVEN 1.2     Recent Results (from the past 240 hour(s))  Culture, blood (Routine x 2)     Status: None (Preliminary result)   Collection Time: 04/18/22  6:20 PM   Specimen: BLOOD  Result Value Ref Range Status   Specimen Description   Final    BLOOD BLOOD RIGHT WRIST Performed at [facility] [address], [place], [place] [address]    Special Requests   Final    BOTTLES DRAWN AEROBIC AND ANAEROBIC Blood Culture adequate volume Performed at [facility] [address] [place], [place] [address]    Culture   Final    NO GROWTH 2 DAYS Performed at [facility] [address], [place], [place] [address]    Report Status PENDING  Incomplete  Culture, blood (Routine x 2)     Status: None (Preliminary result)   Collection Time: 04/18/22  6:49 PM   Specimen: BLOOD  Result Value Ref Range Status   Specimen Description   Final    BLOOD  BLOOD LEFT FOREARM Performed at [facility] [address], [place], [place] [address]    Special Requests   Final    BOTTLES DRAWN AEROBIC AND ANAEROBIC Blood Culture adequate volume Performed at [facility] [address] [place], [place] [address]    Culture   Final    NO GROWTH 2 DAYS Performed at [facility] [address] [place],  [address]    Report Status PENDING  Incomplete         Radiology Studies: CT Head Wo Contrast  Result Date: 04/18/2022 CLINICAL DATA:  Mental status changes, unknown cause. EXAM: CT HEAD WITHOUT CONTRAST TECHNIQUE: Contiguous axial images were obtained from the base of the skull through the vertex without intravenous contrast. RADIATION DOSE REDUCTION: This exam was performed according to the departmental dose-optimization program which includes automated exposure control, adjustment of the mA and/or kV according to patient size and/or use of iterative reconstruction technique. COMPARISON:  Prior head CTs are unavailable in PACS at this time. Comparison is made with report of the most recent head CTs dated 04/01/2020 and 04/06/2020 FINDINGS: Brain: Some images are motion limited with loss of fine detail. There is mild cerebral atrophy and small-vessel disease. The ventricles are normal in size and position. Cerebellum and brainstem are unremarkable. No old territorial infarct is seen. Allowing for motion no acute cortical based infarct, hemorrhage, or mass effect is evident. There is no midline shift. The basal cisterns are clear. Vascular: There are patchy calcifications of the carotid siphons. No hyperdense central vessel is seen. Skull: Negative for fractures or focal lesions. Sinuses/Orbits: No acute orbital findings. Old left lens replacement. A right globe prosthesis. Clear sinuses and mastoid air cells. Other: None. IMPRESSION: No acute intracranial CT findings. Mild atrophy and small-vessel  disease. Carotid atherosclerosis. Electronically Signed   By: Telford Nab M.D.   On: 04/18/2022 20:21   DG Chest Port 1 View  Result Date: 04/18/2022 CLINICAL DATA:  Sepsis. Weak and lethargic. Dysuria and dark urine for 3 days. EXAM: PORTABLE CHEST 1 VIEW COMPARISON:  12/30/2021 FINDINGS: Postoperative changes in the mediastinum. Shallow inspiration. Heart size and pulmonary vascularity are normal for technique. Probable small bilateral pleural effusions with basilar atelectasis. No pneumothorax. Mediastinal contours appear intact. IMPRESSION: Shallow inspiration.  Probable small pleural effusions with basilar atelectasis. Electronically Signed   By: Lucienne Capers M.D.   On: 04/18/2022 19:32        Scheduled Meds:  clopidogrel  75 mg Oral Daily   enoxaparin (LOVENOX) injection  40 mg Subcutaneous Q24H   lactulose  10 g Oral TID   Continuous Infusions:  sodium chloride 100 mL/hr at 04/20/22 0837   cefTRIAXone (ROCEPHIN)  IV 2 g (04/20/22 1123)   thiamine (VITAMIN B1) injection 500 mg (04/20/22 1024)     LOS: 1 day    Time spent: 37 minutes  Georgette Shell, MD 04/20/2022, 1:14 PM

## 2022-04-20 NOTE — Plan of Care (Signed)

## 2022-04-21 DIAGNOSIS — I10 Essential (primary) hypertension: Secondary | ICD-10-CM

## 2022-04-21 DIAGNOSIS — M25551 Pain in right hip: Secondary | ICD-10-CM

## 2022-04-21 DIAGNOSIS — K7460 Unspecified cirrhosis of liver: Secondary | ICD-10-CM | POA: Diagnosis not present

## 2022-04-21 DIAGNOSIS — D696 Thrombocytopenia, unspecified: Secondary | ICD-10-CM

## 2022-04-21 DIAGNOSIS — G4733 Obstructive sleep apnea (adult) (pediatric): Secondary | ICD-10-CM

## 2022-04-21 DIAGNOSIS — G9341 Metabolic encephalopathy: Secondary | ICD-10-CM | POA: Diagnosis not present

## 2022-04-21 DIAGNOSIS — N179 Acute kidney failure, unspecified: Secondary | ICD-10-CM | POA: Diagnosis not present

## 2022-04-21 DIAGNOSIS — I25118 Atherosclerotic heart disease of native coronary artery with other forms of angina pectoris: Secondary | ICD-10-CM | POA: Diagnosis not present

## 2022-04-21 LAB — CBC
HCT: 34.8 % — ABNORMAL LOW (ref 39.0–52.0)
Hemoglobin: 11.4 g/dL — ABNORMAL LOW (ref 13.0–17.0)
MCH: 34.7 pg — ABNORMAL HIGH (ref 26.0–34.0)
MCHC: 32.8 g/dL (ref 30.0–36.0)
MCV: 105.8 fL — ABNORMAL HIGH (ref 80.0–100.0)
Platelets: 60 10*3/uL — ABNORMAL LOW (ref 150–400)
RBC: 3.29 MIL/uL — ABNORMAL LOW (ref 4.22–5.81)
RDW: 17 % — ABNORMAL HIGH (ref 11.5–15.5)
WBC: 4.6 10*3/uL (ref 4.0–10.5)
nRBC: 0 % (ref 0.0–0.2)

## 2022-04-21 LAB — COMPREHENSIVE METABOLIC PANEL
ALT: 93 U/L — ABNORMAL HIGH (ref 0–44)
AST: 154 U/L — ABNORMAL HIGH (ref 15–41)
Albumin: 2.9 g/dL — ABNORMAL LOW (ref 3.5–5.0)
Alkaline Phosphatase: 126 U/L (ref 38–126)
Anion gap: 8 (ref 5–15)
BUN: 11 mg/dL (ref 8–23)
CO2: 24 mmol/L (ref 22–32)
Calcium: 8.4 mg/dL — ABNORMAL LOW (ref 8.9–10.3)
Chloride: 103 mmol/L (ref 98–111)
Creatinine, Ser: 0.68 mg/dL (ref 0.61–1.24)
GFR, Estimated: >60 mL/min (ref >60)
Glucose, Bld: 118 mg/dL — ABNORMAL HIGH (ref 70–99)
Potassium: 4.4 mmol/L (ref 3.5–5.1)
Sodium: 135 mmol/L (ref 135–145)
Total Bilirubin: 1 mg/dL (ref 0.3–1.2)
Total Protein: 7 g/dL (ref 6.5–8.1)

## 2022-04-21 LAB — GLUCOSE, CAPILLARY
Glucose-Capillary: 113 mg/dL — ABNORMAL HIGH (ref 70–99)
Glucose-Capillary: 233 mg/dL — ABNORMAL HIGH (ref 70–99)
Glucose-Capillary: 175 mg/dL — ABNORMAL HIGH (ref 70–99)
Glucose-Capillary: 154 mg/dL — ABNORMAL HIGH (ref 70–99)

## 2022-04-21 LAB — AMMONIA: Ammonia: 40 umol/L — ABNORMAL HIGH (ref 9–35)

## 2022-04-21 MED ORDER — ALBUTEROL SULFATE (2.5 MG/3ML) 0.083% IN NEBU: 3.0000 mL | INHALATION_SOLUTION | Freq: Four times a day (QID) | RESPIRATORY_TRACT | Status: DC | PRN

## 2022-04-21 MED ORDER — ATORVASTATIN CALCIUM 40 MG PO TABS: 80.0000 mg | ORAL_TABLET | Freq: Every day | ORAL | Status: DC

## 2022-04-21 MED ORDER — FLUTICASONE PROPIONATE 0.05 % EX CREA: 1.0000 | TOPICAL_CREAM | Freq: Two times a day (BID) | CUTANEOUS | Status: DC | PRN

## 2022-04-21 MED ORDER — TRAMADOL HCL 50 MG PO TABS
50.0000 mg | ORAL_TABLET | Freq: Four times a day (QID) | ORAL | Status: DC | PRN
  Administered 2022-04-21 – 2022-04-22 (×2): 50 mg via ORAL
  Filled 2022-04-21 (×2): qty 1

## 2022-04-21 MED ORDER — MOMETASONE FURO-FORMOTEROL FUM 100-5 MCG/ACT IN AERO
2.0000 | INHALATION_SPRAY | Freq: Two times a day (BID) | RESPIRATORY_TRACT | Status: DC
  Administered 2022-04-21 – 2022-04-24 (×6): 2 via RESPIRATORY_TRACT
  Filled 2022-04-21: qty 8.8

## 2022-04-21 MED ORDER — PANTOPRAZOLE SODIUM 40 MG PO TBEC
40.0000 mg | DELAYED_RELEASE_TABLET | Freq: Every day | ORAL | Status: DC
  Administered 2022-04-22 – 2022-04-24 (×3): 40 mg via ORAL
  Filled 2022-04-21 (×3): qty 1

## 2022-04-21 MED ORDER — CICLOPIROX OLAMINE 0.77 % EX CREA: TOPICAL_CREAM | Freq: Two times a day (BID) | CUTANEOUS | Status: DC

## 2022-04-21 MED ORDER — THIAMINE HCL 100 MG/ML IJ SOLN
500.0000 mg | Freq: Three times a day (TID) | INTRAVENOUS | Status: AC
  Administered 2022-04-21 – 2022-04-24 (×9): 500 mg via INTRAVENOUS
  Filled 2022-04-21 (×9): qty 5

## 2022-04-21 MED ORDER — ASPIRIN 81 MG PO CHEW
81.0000 mg | CHEWABLE_TABLET | Freq: Every day | ORAL | Status: DC
  Administered 2022-04-22 – 2022-04-24 (×3): 81 mg via ORAL
  Filled 2022-04-21 (×3): qty 1

## 2022-04-21 MED ORDER — FERROUS SULFATE 325 (65 FE) MG PO TABS
325.0000 mg | ORAL_TABLET | Freq: Every day | ORAL | Status: DC
  Administered 2022-04-22 – 2022-04-24 (×3): 325 mg via ORAL
  Filled 2022-04-21 (×3): qty 1

## 2022-04-21 MED ORDER — THIAMINE HCL 100 MG/ML IJ SOLN
250.0000 mg | Freq: Every day | INTRAVENOUS | Status: DC
  Administered 2022-04-24: 250 mg via INTRAVENOUS
  Filled 2022-04-21: qty 2.5

## 2022-04-21 NOTE — TOC Initial Note (Signed)
Transition of Care [facility]) - Initial/Assessment Note    Patient Details  Name: Barry [name] MRN: [id number] Date of Birth: 1955/02/28  Transition of Care [facility]) CM/SW Contact:    Illene Regulus, LCSW Phone Number: 04/21/2022, 4:30 PM  Clinical Narrative:                 CSW spoke with pt about recommendations for SNF placement. Pt is agreeable to placement. Pt reported he was placed in a facility a while ago but does not know the name. CSW explained the process for Snf placement,  pt agreed for information to be sent out to [facility] area. CSW to work pt up for SNF placement.TOC to follow.   Expected Discharge Plan: Skilled Nursing Facility Barriers to Discharge: Continued Medical Work up   Patient Goals and CMS Choice Patient states their goals for this hospitalization and ongoing recovery are:: SNF placemnt          Expected Discharge Plan and Services       Living arrangements for the past 2 months: Apartment                                      Prior Living Arrangements/Services Living arrangements for the past 2 months: Apartment Lives with:: Self, Friends Patient language and need for interpreter reviewed:: Yes Do you feel safe going back to the place where you live?: Yes      Need for Family Participation in Patient Care: No (Comment) Care giver support system in place?: No (comment)   Criminal Activity/Legal Involvement Pertinent to Current Situation/Hospitalization: No - Comment as needed  Activities of Daily Living Home Assistive Devices/Equipment: CBG Meter, Walker (specify type) ADL Screening (condition at time of admission) Patient's cognitive ability adequate to safely complete daily activities?: Yes Is the patient deaf or have difficulty hearing?: Yes Does the patient have difficulty seeing, even when wearing glasses/contacts?: No Does the patient have difficulty concentrating, remembering, or making decisions?: No Patient able to  express need for assistance with ADLs?: Yes Does the patient have difficulty dressing or bathing?: Yes Independently performs ADLs?: No Communication: Independent Dressing (OT): Independent Grooming: Independent Feeding: Independent Bathing: Needs assistance Is this a change from baseline?: Pre-admission baseline Toileting: Needs assistance Is this a change from baseline?: Pre-admission baseline In/Out Bed: Independent Walks in Home: Independent Does the patient have difficulty walking or climbing stairs?: Yes Weakness of Legs: Both Weakness of Arms/Hands: None  Permission Sought/Granted                  Emotional Assessment Appearance:: Appears stated age Attitude/Demeanor/Rapport: Gracious Affect (typically observed): Accepting Orientation: : Oriented to Self, Oriented to Place, Oriented to  Time, Oriented to Situation   Psych Involvement: No (comment)  Admission diagnosis:  Encephalopathy [id number] Acute metabolic encephalopathy [id number] Patient Active Problem List   Diagnosis Date Noted   AKI (acute kidney injury) ([place]) 04/19/2022   Hypotension 99991111   Acute metabolic encephalopathy 99991111   Manifestations of thiamine deficiency 03/06/2022   Rash and nonspecific skin eruption 03/02/2022   Cirrhosis of liver without ascites ([place]) 03/01/2022   Deficiency anemia 03/01/2022   Chronic seborrheic dermatitis 03/01/2022   Genetic testing 10/30/2019   Family history of colon cancer 10/15/2019   IDA (iron deficiency anemia) 07/14/2018   GERD (gastroesophageal reflux disease) 11/03/2017   Thrombocytopenia ([place]) 08/26/2017   Chronic pain of both shoulders 05/18/2017  Abnormal nuclear stress test    Microalbuminuria due to type 2 diabetes mellitus ([place]) 02/16/2017   Type 2 diabetes mellitus with diabetic neuropathy, with long-term current use of insulin ([place]) 06/20/2015   Chest pain, atypical 03/11/2015   Hyperlipidemia LDL goal <70 03/26/2014   Knee  osteoarthritis 09/18/2013   Lumbosacral radiculopathy 09/04/2013   Insomnia 08/14/2013   Essential hypertension, benign 05/15/2013   Erectile dysfunction associated with type 2 diabetes mellitus ([place]) 01/09/2013   Depression with anxiety 09/05/2012   CAD (coronary artery disease) 06/26/2012   Morbid obesity due to excess calories ([place]) 06/26/2012   Obstructive sleep apnea    PCP:  Janith Lima, MD Pharmacy:   [facility] [phone] - [place], [place] - [address] AT [place] [address] [place] [address] Phone: [phone] Fax: [phone]     Social Determinants of Health (SDOH) Social History: SDOH Screenings   Food Insecurity: No Food Insecurity (04/19/2022)  Housing: Low Risk  (04/19/2022)  Transportation Needs: No Transportation Needs (04/19/2022)  Utilities: Not At Risk (04/19/2022)  Depression (PHQ2-9): Low Risk  (05/16/2019)  Tobacco Use: Low Risk  (04/18/2022)   SDOH Interventions:     Readmission Risk Interventions     No data to display

## 2022-04-21 NOTE — Progress Notes (Addendum)
PROGRESS NOTE    Barry [name]  [id number] DOB: [birth year] DOA: 04/18/2022 PCP: Janith Lima, MD   Chief Complaint  Patient presents with   Weakness    Brief Narrative:   67 y.o. male with medical history significant of CAD s/p CAGB x3, HTN, HLD, cirrhosis, insulin dependent T2DM, OSA, obesity who presents with AMS. Patient admitted with change in mental status, lethargy, dark urine, generalized weakness.  Friend called EMS as patient appeared weak and lethargic.  He reported dark urine and dysuria. In the ER his blood pressure was 92/60, afebrile, on room air, not tachycardic.  Labs were significant for chronic thrombocytopenia, no leukocytosis, sodium 135, creatinine 1.11 up from baseline of 0.81.  Elevated AST ALT 158 and 120 respectively.  Ammonia level was 54.  pH was 7.4.  UA showed hyaline cast, ketones and proteins.  UDS was positive for benzos and opioids.  Tylenol level was 17 alcohol less than 10.   CT of the head shows no acute findings.  Mild atrophy and small vessel disease.  Old left lens replacement.  Right globe prosthesis.  Clear sinuses.   Endoscopy in May 2023 with portal hypertensive gastropathy and grade 1 varices.   Home meds include BuSpar, Flexeril, gabapentin, Zoloft.   Assessment & Plan:   Principal Problem:   Acute metabolic encephalopathy Active Problems:   CAD (coronary artery disease)   Hyperlipidemia LDL goal <70   Type 2 diabetes mellitus with diabetic neuropathy, with long-term current use of insulin (HCC)   Cirrhosis of liver without ascites (HCC)   Hypotension   AKI (acute kidney injury) ([place])   Right hip pain   Hypertension   OSA (obstructive sleep apnea)  #1 acute metabolic encephalopathy -??  Etiology. -Differential included dehydration as patient noted to have ketones in his urine and elevated creatinine with dark urine in the setting of thiamine deficiency. -UDS done was positive for benzos and opioids. -Urinalysis leukocyte  negative, nitrite negative, WBCs negative. -Patient noted to have a low vitamin B1 level of 7, TSH within normal limits, vitamin B12 level of 805, folate of 13.6. -CT head with no acute findings. -Chest x-ray done with no acute infiltrate showed small bilateral pleural effusion. -Initial ammonia level noted at 54. -Patient received high-dose vitamin B1 500 mg every 8 hours x 2 days. -Placed on vitamin B1, 500 mg IV every 8 hours x 3 days, and then 250 mg IV daily x 3 days and subsequently vitamin B1 100 mg daily thereafter. -Patient on empiric IV Rocephin which we will continue for now pending finalization of blood cultures.  Will treat empirically for 5 days. -Patient with clinical improvement. -Supportive care.  2.  History of cirrhosis/EGD in the past showed portal gastropathy and grade 1 varices -LFTs elevated. -Acute hepatitis panel with reactive hep B surface antigen, HCV antibody nonreactive, hepatitis A antibody IgM nonreactive, hep B core antibody IgM equivocal. -HIV nonreactive. -Noted not to be on lactulose prior to admission. -Continue low-dose lactulose 10 g 3 times daily for goal bowel movements of approximately 3-5 bowel movements a day. -Supportive care.  3.  Diabetes mellitus type 2 -Hemoglobin A1c 6.7 (03/01/2022) -CBG 113 this morning. -Continue to hold oral hypoglycemic agents. -SSI.  4.  OSA -CPAP nightly.  5.  Hypertension -BP currently stable. -Continue to hold antihypertensive medications.  6.  Hyperlipidemia -Continue to hold statin with elevated LFTs.  7.  CAD/history of CABG -Noted to have been on aspirin, Lipitor, lisinopril, Lopressor prior to  admission which are currently on hold. -Plavix started. -Follow-up.  8.  Chronic thrombocytopenia secondary to cirrhosis -Was on Lovenox which has been discontinued. -Continue to hold aspirin. -Platelet count currently at 60. -No overt bleeding.  9.  Right hip pain/DJD -Patient states he is scheduled to  have his right hip replacement in the near future and recently seen by cardiology for preop clearance which has been done and patient cleared for surgery.  10.  Sarcoidosis -Patient told Dr. Zigmund  recently been diagnosed with sarcoidosis after biopsy of his skin from the right back area approximately 3 weeks ago. -Outpatient follow-up.   DVT prophylaxis: SCDs Code Status: Full Family Communication: Updated patient.  No family at bedside. Disposition: TBD  Status is: Inpatient Remains inpatient appropriate because: Severity of illness   Consultants:  None  Procedures:  CT head 04/18/2022 Chest x-ray 04/18/2022   Antimicrobials:  IV Rocephin 04/19/2022 >>>>>    Subjective: Patient sitting up in recliner.  Complaining of pain all over.  States had a fracture in his right hip and has been scheduled for hip repair electively.  Denies any chest pain or shortness of breath.  No abdominal pain.  Objective: Vitals:   04/20/22 1532 04/20/22 2035 04/21/22 0540 04/21/22 1337  BP: (!) 144/69 134/71 130/71 123/84  Pulse: 76 67 70 90  Resp: 18 16 16 18   Temp: 97.8 F (36.6 C) 99 F (37.2 C) 97.7 F (36.5 C) 97.8 F (36.6 C)  TempSrc: Oral Oral Oral   SpO2: 93% 100% 93% 97%  Weight:      Height:        Intake/Output Summary (Last 24 hours) at 04/21/2022 1925 Last data filed at 04/21/2022 1855 Gross per 24 hour  Intake 390 ml  Output 1100 ml  Net -710 ml   Filed Weights   04/18/22 1810 04/19/22 0410  Weight: 105.7 kg 107.2 kg    Examination:  General exam: Appears calm and comfortable  Respiratory system: Clear to auscultation.  No wheezes, no crackles, no rhonchi.  Fair air movement.  Speaking in full sentences.  Respiratory effort normal. Cardiovascular system: S1 & S2 heard, RRR. No JVD, murmurs, rubs, gallops or clicks. No pedal edema. Gastrointestinal system: Abdomen is nondistended, soft and nontender. No organomegaly or masses felt. Normal bowel sounds  heard. Central nervous system: Alert and oriented. No focal neurological deficits. Extremities: Symmetric 5 x 5 power. Skin: No rashes, lesions or ulcers Psychiatry: Judgement and insight appear normal. Mood & affect appropriate.     Data Reviewed: I have personally reviewed following labs and imaging studies  CBC: Recent Labs  Lab 04/18/22 1820 04/19/22 0238 04/20/22 0423 04/21/22 0854  WBC 5.8 4.9 2.5* 4.6  NEUTROABS 3.4  --   --   --   HGB 12.0* 11.4* 11.3* 11.4*  HCT 36.9* 35.3* 35.6* 34.8*  MCV 106.0* 107.3* 109.2* 105.8*  PLT 76* 63* 52* 60*    Basic Metabolic Panel: Recent Labs  Lab 04/18/22 1820 04/19/22 0238 04/20/22 0423 04/21/22 0432  NA 135 134* 136 135  K 4.2 4.3 4.4 4.4  CL 104 101 104 103  CO2 24 23 25 24   GLUCOSE 78 77 125* 118*  BUN 31* 27* 16 11  CREATININE 1.11 1.06 0.86 0.68  CALCIUM 8.1* 8.0* 8.1* 8.4*    GFR: Estimated Creatinine Clearance: 107.1 mL/min (by C-G formula based on SCr of 0.68 mg/dL).  Liver Function Tests: Recent Labs  Lab 04/18/22 1820 04/20/22 0423 04/21/22 0432  AST 158*  190* 154*  ALT 120* 103* 93*  ALKPHOS 134* 126 126  BILITOT 1.7* 0.9 1.0  PROT 7.3 6.7 7.0  ALBUMIN 3.1* 2.8* 2.9*    CBG: Recent Labs  Lab 04/20/22 1647 04/20/22 2200 04/21/22 0737 04/21/22 1219 04/21/22 1617  GLUCAP 146* 131* 113* 233* 175*     Recent Results (from the past 240 hour(s))  Culture, blood (Routine x 2)     Status: None (Preliminary result)   Collection Time: 04/18/22  6:20 PM   Specimen: BLOOD  Result Value Ref Range Status   Specimen Description   Final    BLOOD BLOOD RIGHT WRIST Performed at [facility] [address] [place], [place] [address]    Special Requests   Final    BOTTLES DRAWN AEROBIC AND ANAEROBIC Blood Culture adequate volume Performed at [facility] [address], [place], [place] [address]    Culture   Final    NO GROWTH 3 DAYS Performed at [facility], [place] [address] [place], [place] [address]    Report Status PENDING  Incomplete  Culture, blood (Routine x 2)     Status: None (Preliminary result)   Collection Time: 04/18/22  6:49 PM   Specimen: BLOOD  Result Value Ref Range Status   Specimen Description   Final    BLOOD BLOOD LEFT FOREARM Performed at [facility] [address] [place], [place] [address]    Special Requests   Final    BOTTLES DRAWN AEROBIC AND ANAEROBIC Blood Culture adequate volume Performed at [facility] [address], [place], [place] [address]    Culture   Final    NO GROWTH 3 DAYS Performed at [facility], [place] [address] [place], [place] [address]    Report Status PENDING  Incomplete         Radiology Studies: No results found.      Scheduled Meds:  [START ON 04/22/2022] aspirin  81 mg Oral Daily   ciclopirox   Topical BID   clopidogrel  75 mg Oral Daily   ferrous sulfate  325 mg Oral Daily   gabapentin  600 mg Oral TID   insulin aspart  0-15 Units Subcutaneous TID WC   insulin aspart  0-5 Units Subcutaneous QHS   lactulose  10 g Oral TID   mometasone-formoterol  2 puff Inhalation BID   pantoprazole  40 mg Oral Daily   sertraline  100 mg Oral Daily   Continuous Infusions:  cefTRIAXone (ROCEPHIN)  IV Stopped (04/21/22 1043)   thiamine (VITAMIN B1) injection 500 mg (04/21/22 1847)   Followed by   Derrill Memo ON 04/24/2022] thiamine (VITAMIN B1) injection       LOS: 2 days    Time spent: 35 minutes    Irine Seal, MD [facility]   To contact the attending provider between 7A-7P or the covering provider during after hours 7P-7A, please log into the web site [email or web address] and access using universal [address] password for that web site. If you do not have the password, please call the hospital operator.  04/21/2022, 7:25 PM

## 2022-04-21 NOTE — Progress Notes (Signed)
Mobility Specialist - Progress Note   04/21/22 1050  Mobility  Activity Transferred from bed to chair  Level of Assistance Minimal assist, patient does 75% or more  Assistive Device None  Distance Ambulated (ft) 2 ft  Activity Response Tolerated well  Mobility Referral Yes  $Mobility charge 1 Mobility   Pt received in bed and agreeable to sit EOB. Once sitting up, pt found soiled. Pt requested to sit in recliner. NT made aware. No other complaints during session. Pt to recliner after session w/ all needs met & call bell in reach.   [facility]

## 2022-04-21 NOTE — Progress Notes (Signed)
Mobility Specialist - Progress Note   04/21/22 1410  Mobility  Activity Transferred from chair to bed  Level of Assistance Contact guard assist, steadying assist  Assistive Device None  Distance Ambulated (ft) 2 ft  Activity Response Tolerated well  Mobility Referral Yes  $Mobility charge 1 Mobility   Pt received in recliner and agreeable to transfer to bed. No complaints during transfer. Pt to recliner after session with all needs met.    [facility]

## 2022-04-22 DIAGNOSIS — K7460 Unspecified cirrhosis of liver: Secondary | ICD-10-CM | POA: Diagnosis not present

## 2022-04-22 DIAGNOSIS — G9341 Metabolic encephalopathy: Secondary | ICD-10-CM | POA: Diagnosis not present

## 2022-04-22 DIAGNOSIS — N179 Acute kidney failure, unspecified: Secondary | ICD-10-CM | POA: Diagnosis not present

## 2022-04-22 DIAGNOSIS — I25118 Atherosclerotic heart disease of native coronary artery with other forms of angina pectoris: Secondary | ICD-10-CM | POA: Diagnosis not present

## 2022-04-22 LAB — CBC
HCT: 33.3 % — ABNORMAL LOW (ref 39.0–52.0)
Hemoglobin: 11.1 g/dL — ABNORMAL LOW (ref 13.0–17.0)
MCH: 34.7 pg — ABNORMAL HIGH (ref 26.0–34.0)
MCHC: 33.3 g/dL (ref 30.0–36.0)
MCV: 104.1 fL — ABNORMAL HIGH (ref 80.0–100.0)
Platelets: 55 10*3/uL — ABNORMAL LOW (ref 150–400)
RBC: 3.2 MIL/uL — ABNORMAL LOW (ref 4.22–5.81)
RDW: 16.8 % — ABNORMAL HIGH (ref 11.5–15.5)
WBC: 4.4 10*3/uL (ref 4.0–10.5)
nRBC: 0 % (ref 0.0–0.2)

## 2022-04-22 LAB — COMPREHENSIVE METABOLIC PANEL
ALT: 82 U/L — ABNORMAL HIGH (ref 0–44)
AST: 119 U/L — ABNORMAL HIGH (ref 15–41)
Albumin: 2.7 g/dL — ABNORMAL LOW (ref 3.5–5.0)
Alkaline Phosphatase: 123 U/L (ref 38–126)
Anion gap: 7 (ref 5–15)
BUN: 12 mg/dL (ref 8–23)
CO2: 25 mmol/L (ref 22–32)
Calcium: 8.1 mg/dL — ABNORMAL LOW (ref 8.9–10.3)
Chloride: 103 mmol/L (ref 98–111)
Creatinine, Ser: 0.76 mg/dL (ref 0.61–1.24)
GFR, Estimated: >60 mL/min (ref >60)
Glucose, Bld: 135 mg/dL — ABNORMAL HIGH (ref 70–99)
Potassium: 3.9 mmol/L (ref 3.5–5.1)
Sodium: 135 mmol/L (ref 135–145)
Total Bilirubin: 0.8 mg/dL (ref 0.3–1.2)
Total Protein: 6.5 g/dL (ref 6.5–8.1)

## 2022-04-22 LAB — GLUCOSE, CAPILLARY
Glucose-Capillary: 136 mg/dL — ABNORMAL HIGH (ref 70–99)
Glucose-Capillary: 164 mg/dL — ABNORMAL HIGH (ref 70–99)
Glucose-Capillary: 176 mg/dL — ABNORMAL HIGH (ref 70–99)
Glucose-Capillary: 142 mg/dL — ABNORMAL HIGH (ref 70–99)

## 2022-04-22 LAB — HEPATITIS PANEL, ACUTE
HCV Ab: NONREACTIVE
Hep A IgM: NONREACTIVE
Hep B C IgM: UNDETERMINED — AB
Hepatitis B Surface Ag: REACTIVE — AB

## 2022-04-22 LAB — MAGNESIUM: Magnesium: 1.4 mg/dL — ABNORMAL LOW (ref 1.7–2.4)

## 2022-04-22 MED ORDER — MAGNESIUM SULFATE 4 GM/100ML IV SOLN
4.0000 g | Freq: Once | INTRAVENOUS | Status: AC
  Administered 2022-04-22: 4 g via INTRAVENOUS
  Filled 2022-04-22: qty 100

## 2022-04-22 MED ORDER — CEFDINIR 300 MG PO CAPS
300.0000 mg | ORAL_CAPSULE | Freq: Two times a day (BID) | ORAL | Status: DC
  Administered 2022-04-23 – 2022-04-24 (×3): 300 mg via ORAL
  Filled 2022-04-22 (×3): qty 1

## 2022-04-22 NOTE — TOC Progression Note (Signed)
Transition of Care [facility]) - Progression Note    Patient Details  Name: Barry [name] MRN: [id number] Date of Birth: [birth year]  Transition of Care [facility]) CM/SW [place], LCSW Phone Number: 04/22/2022, 2:44 PM  Clinical Narrative:     Presented bed offers, pt chose [facility]. Will start insurance auth when pt is close to being medically stable.     Expected Discharge Plan: Skilled Nursing Facility Barriers to Discharge: Continued Medical Work up  Expected Discharge Plan and Services       Living arrangements for the past 2 months: Apartment                                       Social Determinants of Health (SDOH) Interventions SDOH Screenings   Food Insecurity: No Food Insecurity (04/19/2022)  Housing: Low Risk  (04/19/2022)  Transportation Needs: No Transportation Needs (04/19/2022)  Utilities: Not At Risk (04/19/2022)  Depression (PHQ2-9): Low Risk  (05/16/2019)  Tobacco Use: Low Risk  (04/18/2022)    Readmission Risk Interventions     No data to display

## 2022-04-22 NOTE — Progress Notes (Signed)
Physical Therapy Treatment Patient Details Name: Barry [name] MRN: [id number] DOB: 03-Sep-1955 Today's Date: 04/22/2022   History of Present Illness Pt is 67 y.o. male admitted on 04/18/22 with acute metabolic encephalopathy with AKI.  UDS with positive for benzos and opiods. Pt medical history significant of CAD s/p CAGB x3, HTN, HLD, cirrhosis, insulin dependent T2DM, OSA, obesity, R eye prosthesis    PT Comments    Pt requiring increased time with rest breaks for transfers.  He was able to ambulate 30' but with RW, min A, and joint pain with audible crepitus in bil LE.   Further activity limited by joint pain.  Barriers to return home are limited physical assistance available, home environment (flight of steps), AMPAC score 14. Cont POC.    Recommendations for follow up therapy are one component of a multi-disciplinary discharge planning process, led by the attending physician.  Recommendations may be updated based on patient status, additional functional criteria and insurance authorization.  Follow Up Recommendations  Can patient physically be transported by private vehicle: Yes    Assistance Recommended at Discharge Frequent or constant Supervision/Assistance  Patient can return home with the following Assist for transportation;Help with stairs or ramp for entrance;Assistance with cooking/housework;A little help with bathing/dressing/bathroom;A little help with walking and/or transfers   Equipment Recommendations  Rolling walker (2 wheels)    Recommendations for Other Services       Precautions / Restrictions Precautions Precautions: Fall     Mobility  Bed Mobility Overal bed mobility: Needs Assistance Bed Mobility: Supine to Sit     Supine to sit: Min assist     General bed mobility comments: assist to raise trunk, increased time; heavy use bedrail    Transfers Overall transfer level: Needs assistance Equipment used: Rolling walker (2 wheels) Transfers: Sit to/from  Stand Sit to Stand: From elevated surface, Min assist   Step pivot transfers: Min assist       General transfer comment: Min A to stabilize RW; auidable crepitus in bil LE    Ambulation/Gait Ambulation/Gait assistance: Min assist Gait Distance (Feet): 30 Feet Assistive device: Rolling walker (2 wheels) Gait Pattern/deviations: Step-to pattern, Decreased stride length, Trunk flexed Gait velocity: decreased     General Gait Details: distance limited due to pain and fatigue; audiable crepitus in bil LE; cues for use of RW   Stairs             Wheelchair Mobility    Modified Rankin (Stroke Patients Only)       Balance Overall balance assessment: Needs assistance Sitting-balance support: Feet supported, No upper extremity supported Sitting balance-Leahy Scale: Fair     Standing balance support: Bilateral upper extremity supported, During functional activity, Reliant on assistive device for balance Standing balance-Leahy Scale: Poor                              Cognition Arousal/Alertness: Awake/alert Behavior During Therapy: WFL for tasks assessed/performed Overall Cognitive Status: Within Functional Limits for tasks assessed                                          Exercises      General Comments General comments (skin integrity, edema, etc.): Further exercise limited due to joint pain      Pertinent Vitals/Pain Pain Assessment Pain Assessment: Faces Faces Pain Scale:  Hurts little more Pain Location: R hip and bil knee Pain Descriptors / Indicators: Discomfort Pain Intervention(s): Limited activity within patient's tolerance, Monitored during session    Home Living                          Prior Function            PT Goals (current goals can now be found in the care plan section) Progress towards PT goals: Progressing toward goals    Frequency    Min 2X/week      PT Plan Current plan remains  appropriate    Co-evaluation              AM-PAC PT "6 Clicks" Mobility   Outcome Measure  Help needed turning from your back to your side while in a flat bed without using bedrails?: A Little Help needed moving from lying on your back to sitting on the side of a flat bed without using bedrails?: A Lot Help needed moving to and from a bed to a chair (including a wheelchair)?: A Little Help needed standing up from a chair using your arms (e.g., wheelchair or bedside chair)?: A Little Help needed to walk in hospital room?: A Lot Help needed climbing 3-5 steps with a railing? : Total 6 Click Score: 14    End of Session Equipment Utilized During Treatment: Gait belt Activity Tolerance: Patient limited by fatigue;Patient limited by pain Patient left: in chair;with chair alarm set;with call bell/phone within reach Nurse Communication: Mobility status PT Visit Diagnosis: Difficulty in walking, not elsewhere classified (R26.2);Muscle weakness (generalized) (M62.81);Adult, failure to thrive (R62.7)     Time: [id number] PT Time Calculation (min) (ACUTE ONLY): 30 min  Charges:  $Gait Training: 8-22 mins $Therapeutic Activity: 8-22 mins                     Abran Richard, PT Acute Rehab [facility] [phone]    Barry [name] 04/22/2022, 3:50 PM

## 2022-04-22 NOTE — Progress Notes (Signed)
PROGRESS NOTE    Barry [name]  [id number] DOB: [birth year] DOA: 04/18/2022 PCP: Janith Lima, MD   Chief Complaint  Patient presents with   Weakness    Brief Narrative:   67 y.o. male with medical history significant of CAD s/p CAGB x3, HTN, HLD, cirrhosis, insulin dependent T2DM, OSA, obesity who presents with AMS. Patient admitted with change in mental status, lethargy, dark urine, generalized weakness.  Friend called EMS as patient appeared weak and lethargic.  He reported dark urine and dysuria. In the ER his blood pressure was 92/60, afebrile, on room air, not tachycardic.  Labs were significant for chronic thrombocytopenia, no leukocytosis, sodium 135, creatinine 1.11 up from baseline of 0.81.  Elevated AST ALT 158 and 120 respectively.  Ammonia level was 54.  pH was 7.4.  UA showed hyaline cast, ketones and proteins.  UDS was positive for benzos and opioids.  Tylenol level was 17 alcohol less than 10.   CT of the head shows no acute findings.  Mild atrophy and small vessel disease.  Old left lens replacement.  Right globe prosthesis.  Clear sinuses.   Endoscopy in May 2023 with portal hypertensive gastropathy and grade 1 varices.   Home meds include BuSpar, Flexeril, gabapentin, Zoloft.   Assessment & Plan:   Principal Problem:   Acute metabolic encephalopathy Active Problems:   CAD (coronary artery disease)   Hyperlipidemia LDL goal <70   Type 2 diabetes mellitus with diabetic neuropathy, with long-term current use of insulin (HCC)   Cirrhosis of liver without ascites (HCC)   Hypotension   AKI (acute kidney injury) ([place])   Right hip pain   Hypertension   OSA (obstructive sleep apnea)  #1 acute metabolic encephalopathy -??  Etiology. -Differential included dehydration as patient noted to have ketones in his urine and elevated creatinine with dark urine in the setting of thiamine deficiency. -UDS done was positive for benzos and opioids. -Urinalysis leukocyte  negative, nitrite negative, WBCs negative. -Patient noted to have a low vitamin B1 level of 7, TSH within normal limits, vitamin B12 level of 805, folate of 13.6. -CT head with no acute findings. -Chest x-ray done with no acute infiltrate showed small bilateral pleural effusion. -Initial ammonia level noted at 54. -Patient received high-dose vitamin B1 500 mg every 8 hours x 2 days. -Continue vitamin B1, 500 mg IV every 8 hours x 3 days, and then 250 mg IV daily x 3 days and subsequently vitamin B1 100 mg daily thereafter. -Patient on empiric IV Rocephin and will transition to oral Omnicef to complete a 5 to 7-day course of treatment.  -Patient with clinical improvement. -Supportive care.  2.  History of cirrhosis/EGD in the past showed portal gastropathy and grade 1 varices -LFTs elevated. -Acute hepatitis panel with reactive hep B surface antigen, HCV antibody nonreactive, hepatitis A antibody IgM nonreactive, hep B core antibody IgM equivocal. -HIV nonreactive. -Noted not to be on lactulose prior to admission. -Continue low-dose lactulose 10 g 3 times daily for goal bowel movements of approximately 3-5 bowel movements a day. -Supportive care.  3.  Diabetes mellitus type 2 -Hemoglobin A1c 6.7 (03/01/2022) -CBG 136 this morning. -Continue to hold oral hypoglycemic agents. -SSI.  4.  OSA -CPAP nightly.  5.  Hypertension -BP currently stable. -Continue to hold antihypertensive medications.  6.  Hyperlipidemia -Continue to hold statin with elevated LFTs.  7.  CAD/history of CABG -Noted to have been on aspirin, Lipitor, lisinopril, Lopressor prior to admission which were  initially held.   -Aspirin and Plavix resumed.   -Continue to hold statin with elevated LFTs.  -Blood pressure stable/soft, continue to hold lisinopril and Lopressor. -Follow-up.  8.  Chronic thrombocytopenia secondary to cirrhosis -Was on Lovenox which has been discontinued. -Platelet count currently at  55. -No overt bleeding. -Monitor closely on Plavix and aspirin.  9.  Right hip pain/DJD -Patient states he is scheduled to have his right hip replacement in the near future and recently seen by cardiology for preop clearance which has been done and patient cleared for surgery. -Patient scheduled for elective surgery for total hip arthroplasty on 05/19/2022 with Dr. Lyla Glassing.  10.  Sarcoidosis -Patient told Dr. Zigmund  recently been diagnosed with sarcoidosis after biopsy of his skin from the right back area approximately 3 weeks ago. -Outpatient follow-up.   DVT prophylaxis: SCDs Code Status: Full Family Communication: Updated patient.  No family at bedside. Disposition: Patient medically stable. SNF when bed available.  Status is: Inpatient Remains inpatient appropriate because: Severity of illness   Consultants:  None  Procedures:  CT head 04/18/2022 Chest x-ray 04/18/2022   Antimicrobials:  IV Rocephin 04/19/2022 >>>>> 04/22/2022 Omnicef 04/23/2022   Subjective: Laying in bed.  IV nurse at bedside trying to place an IV.  Stated did not sleep too well takes him a while to get used to back to the CPAP machine.  No chest pain no shortness of breath.  No abdominal pain.  Still with complaints of diffuse pain.  States Ultram helped a little bit.    Objective: Vitals:   04/21/22 2137 04/21/22 2202 04/22/22 0429 04/22/22 1016  BP:   (!) 115/59   Pulse:  84 61   Resp:  20 20   Temp:   98.2 F (36.8 C)   TempSrc:   Oral   SpO2: 95% 93% 92% 95%  Weight:      Height:        Intake/Output Summary (Last 24 hours) at 04/22/2022 1208 Last data filed at 04/22/2022 1100 Gross per 24 hour  Intake 630 ml  Output 1950 ml  Net -1320 ml    Filed Weights   04/18/22 1810 04/19/22 0410  Weight: 105.7 kg 107.2 kg    Examination:  General exam: NAD. Respiratory system: Lungs clear to auscultation bilaterally.  No wheezes, no crackles, no rhonchi.  Fair air movement.  Speaking in  full sentences.  Cardiovascular system: Regular rate and rhythm no murmurs rubs or gallops.  No JVD.  No lower extremity edema.  Gastrointestinal system: Abdomen is soft, nontender, nondistended, positive bowel sounds.  No rebound.  No guarding.  Central nervous system: Alert and oriented. No focal neurological deficits. Extremities: Symmetric 5 x 5 power. Skin: No rashes, lesions or ulcers Psychiatry: Judgement and insight appear normal. Mood & affect appropriate.     Data Reviewed: I have personally reviewed following labs and imaging studies  CBC: Recent Labs  Lab 04/18/22 1820 04/19/22 0238 04/20/22 0423 04/21/22 0854 04/22/22 0420  WBC 5.8 4.9 2.5* 4.6 4.4  NEUTROABS 3.4  --   --   --   --   HGB 12.0* 11.4* 11.3* 11.4* 11.1*  HCT 36.9* 35.3* 35.6* 34.8* 33.3*  MCV 106.0* 107.3* 109.2* 105.8* 104.1*  PLT 76* 63* 52* 60* 55*     Basic Metabolic Panel: Recent Labs  Lab 04/18/22 1820 04/19/22 0238 04/20/22 0423 04/21/22 0432 04/22/22 0420  NA 135 134* 136 135 135  K 4.2 4.3 4.4 4.4 3.9  CL 104  101 104 103 103  CO2 24 23 25 24 25   GLUCOSE 78 77 125* 118* 135*  BUN 31* 27* 16 11 12   CREATININE 1.11 1.06 0.86 0.68 0.76  CALCIUM 8.1* 8.0* 8.1* 8.4* 8.1*  MG  --   --   --   --  1.4*     GFR: Estimated Creatinine Clearance: 107.1 mL/min (by C-G formula based on SCr of 0.76 mg/dL).  Liver Function Tests: Recent Labs  Lab 04/18/22 1820 04/20/22 0423 04/21/22 0432 04/22/22 0420  AST 158* 190* 154* 119*  ALT 120* 103* 93* 82*  ALKPHOS 134* 126 126 123  BILITOT 1.7* 0.9 1.0 0.8  PROT 7.3 6.7 7.0 6.5  ALBUMIN 3.1* 2.8* 2.9* 2.7*     CBG: Recent Labs  Lab 04/21/22 1219 04/21/22 1617 04/21/22 2048 04/22/22 0738 04/22/22 1138  GLUCAP 233* 175* 154* 136* 164*      Recent Results (from the past 240 hour(s))  Culture, blood (Routine x 2)     Status: None (Preliminary result)   Collection Time: 04/18/22  6:20 PM   Specimen: BLOOD  Result Value Ref  Range Status   Specimen Description   Final    BLOOD BLOOD RIGHT WRIST Performed at [facility] [address], [place], [place] [address]    Special Requests   Final    BOTTLES DRAWN AEROBIC AND ANAEROBIC Blood Culture adequate volume Performed at [facility] [address], [place], [place] [address]    Culture   Final    NO GROWTH 4 DAYS Performed at [facility], [place] [address] [place], [place] [address]    Report Status PENDING  Incomplete  Culture, blood (Routine x 2)     Status: None (Preliminary result)   Collection Time: 04/18/22  6:49 PM   Specimen: BLOOD  Result Value Ref Range Status   Specimen Description   Final    BLOOD BLOOD LEFT FOREARM Performed at [facility] [address] [place], [place] [address]    Special Requests   Final    BOTTLES DRAWN AEROBIC AND ANAEROBIC Blood Culture adequate volume Performed at [facility] [address], [place], [place] [address]    Culture   Final    NO GROWTH 4 DAYS Performed at [facility], [place] [address], [place], [place] [address]    Report Status PENDING  Incomplete         Radiology Studies: No results found.      Scheduled Meds:  aspirin  81 mg Oral Daily   ciclopirox   Topical BID   clopidogrel  75 mg Oral Daily   ferrous sulfate  325 mg Oral Daily   gabapentin  600 mg Oral TID   insulin aspart  0-15 Units Subcutaneous TID WC   insulin aspart  0-5 Units Subcutaneous QHS   lactulose  10 g Oral TID   mometasone-formoterol  2 puff Inhalation BID   pantoprazole  40 mg Oral Daily   sertraline  100 mg Oral Daily   Continuous Infusions:  cefTRIAXone (ROCEPHIN)  IV 2 g (04/22/22 0957)   magnesium sulfate bolus IVPB     thiamine (VITAMIN B1) injection 500 mg (04/22/22 0154)   Followed by   Derrill Memo ON 04/24/2022] thiamine (VITAMIN B1) injection       LOS: 3 days    Time spent: 35 minutes    Irine Seal, MD [facility]   To  contact the attending provider between 7A-7P or the covering provider during after hours 7P-7A, please log into the web site [email or web address] and access using universal [place] password for that web site. If you do not have the password, please call the hospital operator.  04/22/2022, 12:08 PM

## 2022-04-23 DIAGNOSIS — G9341 Metabolic encephalopathy: Secondary | ICD-10-CM | POA: Diagnosis not present

## 2022-04-23 DIAGNOSIS — K7460 Unspecified cirrhosis of liver: Secondary | ICD-10-CM | POA: Diagnosis not present

## 2022-04-23 DIAGNOSIS — I25118 Atherosclerotic heart disease of native coronary artery with other forms of angina pectoris: Secondary | ICD-10-CM | POA: Diagnosis not present

## 2022-04-23 DIAGNOSIS — N179 Acute kidney failure, unspecified: Secondary | ICD-10-CM | POA: Diagnosis not present

## 2022-04-23 LAB — COMPREHENSIVE METABOLIC PANEL
ALT: 86 U/L — ABNORMAL HIGH (ref 0–44)
AST: 119 U/L — ABNORMAL HIGH (ref 15–41)
Albumin: 3 g/dL — ABNORMAL LOW (ref 3.5–5.0)
Alkaline Phosphatase: 139 U/L — ABNORMAL HIGH (ref 38–126)
Anion gap: 6 (ref 5–15)
BUN: 12 mg/dL (ref 8–23)
CO2: 24 mmol/L (ref 22–32)
Calcium: 8.5 mg/dL — ABNORMAL LOW (ref 8.9–10.3)
Chloride: 104 mmol/L (ref 98–111)
Creatinine, Ser: 0.8 mg/dL (ref 0.61–1.24)
GFR, Estimated: >60 mL/min (ref >60)
Glucose, Bld: 139 mg/dL — ABNORMAL HIGH (ref 70–99)
Potassium: 3.7 mmol/L (ref 3.5–5.1)
Sodium: 134 mmol/L — ABNORMAL LOW (ref 135–145)
Total Bilirubin: 0.8 mg/dL (ref 0.3–1.2)
Total Protein: 7.4 g/dL (ref 6.5–8.1)

## 2022-04-23 LAB — CULTURE, BLOOD (ROUTINE X 2)
Culture: NO GROWTH
Culture: NO GROWTH
Special Requests: ADEQUATE
Special Requests: ADEQUATE

## 2022-04-23 LAB — CBC
HCT: 37.1 % — ABNORMAL LOW (ref 39.0–52.0)
Hemoglobin: 12.3 g/dL — ABNORMAL LOW (ref 13.0–17.0)
MCH: 34.6 pg — ABNORMAL HIGH (ref 26.0–34.0)
MCHC: 33.2 g/dL (ref 30.0–36.0)
MCV: 104.5 fL — ABNORMAL HIGH (ref 80.0–100.0)
Platelets: 63 10*3/uL — ABNORMAL LOW (ref 150–400)
RBC: 3.55 MIL/uL — ABNORMAL LOW (ref 4.22–5.81)
RDW: 16.9 % — ABNORMAL HIGH (ref 11.5–15.5)
WBC: 4.7 10*3/uL (ref 4.0–10.5)
nRBC: 0 % (ref 0.0–0.2)

## 2022-04-23 LAB — GLUCOSE, CAPILLARY
Glucose-Capillary: 120 mg/dL — ABNORMAL HIGH (ref 70–99)
Glucose-Capillary: 187 mg/dL — ABNORMAL HIGH (ref 70–99)
Glucose-Capillary: 164 mg/dL — ABNORMAL HIGH (ref 70–99)

## 2022-04-23 LAB — MAGNESIUM: Magnesium: 2 mg/dL (ref 1.7–2.4)

## 2022-04-23 MED ORDER — TRAMADOL HCL 50 MG PO TABS
100.0000 mg | ORAL_TABLET | Freq: Four times a day (QID) | ORAL | Status: DC | PRN
  Administered 2022-04-23 (×2): 100 mg via ORAL
  Filled 2022-04-23 (×2): qty 2

## 2022-04-23 NOTE — Progress Notes (Signed)
PROGRESS NOTE    Barry [name]  [id number] DOB: 07-Oct-1955 DOA: 04/18/2022 PCP: Janith Lima, MD   Chief Complaint  Patient presents with   Weakness    Brief Narrative:   67 y.o. male with medical history significant of CAD s/p CAGB x3, HTN, HLD, cirrhosis, insulin dependent T2DM, OSA, obesity who presents with AMS. Patient admitted with change in mental status, lethargy, dark urine, generalized weakness.  Friend called EMS as patient appeared weak and lethargic.  He reported dark urine and dysuria. In the ER his blood pressure was 92/60, afebrile, on room air, not tachycardic.  Labs were significant for chronic thrombocytopenia, no leukocytosis, sodium 135, creatinine 1.11 up from baseline of 0.81.  Elevated AST ALT 158 and 120 respectively.  Ammonia level was 54.  pH was 7.4.  UA showed hyaline cast, ketones and proteins.  UDS was positive for benzos and opioids.  Tylenol level was 17 alcohol less than 10.   CT of the head shows no acute findings.  Mild atrophy and small vessel disease.  Old left lens replacement.  Right globe prosthesis.  Clear sinuses.   Endoscopy in May 2023 with portal hypertensive gastropathy and grade 1 varices.   Home meds include BuSpar, Flexeril, gabapentin, Zoloft.   Assessment & Plan:   Principal Problem:   Acute metabolic encephalopathy Active Problems:   CAD (coronary artery disease)   Hyperlipidemia LDL goal <70   Type 2 diabetes mellitus with diabetic neuropathy, with long-term current use of insulin (HCC)   Cirrhosis of liver without ascites (HCC)   Hypotension   AKI (acute kidney injury) ([place])   Right hip pain   Hypertension   OSA (obstructive sleep apnea)  #1 acute metabolic encephalopathy -??  Etiology. -Differential included dehydration as patient noted to have ketones in his urine and elevated creatinine with dark urine in the setting of thiamine deficiency. -UDS done was positive for benzos and opioids. -Urinalysis leukocyte  negative, nitrite negative, WBCs negative. -Patient noted to have a low vitamin B1 level of 7, TSH within normal limits, vitamin B12 level of 805, folate of 13.6. -CT head with no acute findings. -Chest x-ray done with no acute infiltrate showed small bilateral pleural effusion. -Initial ammonia level noted at 54. -Patient received high-dose vitamin B1 500 mg every 8 hours x 2 days. -Continue vitamin B1, 500 mg IV every 8 hours x 3 days, and then 250 mg IV daily x 3 days and subsequently vitamin B1 100 mg daily thereafter. -Was empirically on IV Rocephin and has been transition to oral Omnicef to complete a 7-day course of antibiotic treatment.   -Patient improved clinically and likely close to baseline. -Supportive care.   2.  History of cirrhosis/EGD in the past showed portal gastropathy and grade 1 varices -LFTs elevated. -Acute hepatitis panel with reactive hep B surface antigen, HCV antibody nonreactive, hepatitis A antibody IgM nonreactive, hep B core antibody IgM equivocal. -HIV nonreactive. -Noted not to be on lactulose prior to admission. -Continue low-dose lactulose 10 g 3 times daily for goal bowel movements of approximately 3-5 bowel movements a day. -Supportive care.  3.  Diabetes mellitus type 2 -Hemoglobin A1c 6.7 (03/01/2022) -CBG noted at 120 this morning.   -Continue to hold oral hypoglycemic agents.   -SSI.    4.  OSA -CPAP nightly.  5.  Hypertension -BP currently stable. -Continue to hold antihypertensive medications.  6.  Hyperlipidemia -Continue to hold statin with elevated LFTs.  7.  CAD/history of CABG -  Noted to have been on aspirin, Lipitor, lisinopril, Lopressor prior to admission which were initially held.   -Aspirin and Plavix resumed.   -Continue to hold statin with elevated LFTs.  -Blood pressure stable/soft, continue to hold lisinopril and Lopressor. -Outpatient follow-up.   8.  Chronic thrombocytopenia secondary to cirrhosis -Was on Lovenox  which has been discontinued. -Patient with no overt bleeding.   -Platelet count currently at 63 K.    9.  Right hip pain/DJD -Patient states he is scheduled to have his right hip replacement in the near future and recently seen by cardiology for preop clearance which has been done and patient cleared for surgery. -Patient scheduled for elective surgery for total hip arthroplasty on 05/19/2022 with Dr. Lyla Glassing. -Outpatient follow-up.  10.  Sarcoidosis -Patient told Dr. Zigmund  recently been diagnosed with sarcoidosis after biopsy of his skin from the right back area approximately 3 weeks ago. -Outpatient follow-up.   DVT prophylaxis: SCDs Code Status: Full Family Communication: Updated patient.  No family at bedside. Disposition: Patient medically stable. SNF when bed available.  Status is: Inpatient Remains inpatient appropriate because: Severity of illness   Consultants:  None  Procedures:  CT head 04/18/2022 Chest x-ray 04/18/2022   Antimicrobials:  IV Rocephin 04/19/2022 >>>>> 04/22/2022 Omnicef 04/23/2022>>>>>   Subjective: Laying in bed, eating a burger for lunch.  No chest pain.  No shortness of pain.  Just with some complaints of diffuse pain.  States he used CPAP machine overnight.    Objective: Vitals:   04/22/22 1016 04/22/22 1408 04/22/22 2102 04/23/22 0547  BP:  114/64 134/78 (!) 144/75  Pulse:  78 63 61  Resp:  19 17 18   Temp:  98.2 F (36.8 C) 98.6 F (37 C) 98.2 F (36.8 C)  TempSrc:  Oral Oral Oral  SpO2: 95% 95% 91% 97%  Weight:      Height:        Intake/Output Summary (Last 24 hours) at 04/23/2022 1239 Last data filed at 04/23/2022 0830 Gross per 24 hour  Intake 1028.23 ml  Output 850 ml  Net 178.23 ml    Filed Weights   04/18/22 1810 04/19/22 0410  Weight: 105.7 kg 107.2 kg    Examination:  General exam: NAD. Respiratory system: CTAB anterior lung fields.  No wheezes, no crackles, no rhonchi.  Fair air movement.  Speaking in full  sentences.  Cardiovascular system: RRR no murmurs rubs or gallops.  No JVD.  No lower extremity edema.   Gastrointestinal system: Abdomen is soft, nontender, nondistended, positive bowel sounds.  No rebound.  No guarding.  Central nervous system: Alert and oriented. No focal neurological deficits. Extremities: Symmetric 5 x 5 power. Skin: No rashes, lesions or ulcers Psychiatry: Judgement and insight appear normal. Mood & affect appropriate.     Data Reviewed: I have personally reviewed following labs and imaging studies  CBC: Recent Labs  Lab 04/18/22 1820 04/19/22 0238 04/20/22 0423 04/21/22 0854 04/22/22 0420 04/23/22 0346  WBC 5.8 4.9 2.5* 4.6 4.4 4.7  NEUTROABS 3.4  --   --   --   --   --   HGB 12.0* 11.4* 11.3* 11.4* 11.1* 12.3*  HCT 36.9* 35.3* 35.6* 34.8* 33.3* 37.1*  MCV 106.0* 107.3* 109.2* 105.8* 104.1* 104.5*  PLT 76* 63* 52* 60* 55* 63*     Basic Metabolic Panel: Recent Labs  Lab 04/19/22 0238 04/20/22 0423 04/21/22 0432 04/22/22 0420 04/23/22 0346  NA 134* 136 135 135 134*  K 4.3 4.4 4.4  3.9 3.7  CL 101 104 103 103 104  CO2 23 25 24 25 24   GLUCOSE 77 125* 118* 135* 139*  BUN 27* 16 11 12 12   CREATININE 1.06 0.86 0.68 0.76 0.80  CALCIUM 8.0* 8.1* 8.4* 8.1* 8.5*  MG  --   --   --  1.4* 2.0     GFR: Estimated Creatinine Clearance: 107.1 mL/min (by C-G formula based on SCr of 0.8 mg/dL).  Liver Function Tests: Recent Labs  Lab 04/18/22 1820 04/20/22 0423 04/21/22 0432 04/22/22 0420 04/23/22 0346  AST 158* 190* 154* 119* 119*  ALT 120* 103* 93* 82* 86*  ALKPHOS 134* 126 126 123 139*  BILITOT 1.7* 0.9 1.0 0.8 0.8  PROT 7.3 6.7 7.0 6.5 7.4  ALBUMIN 3.1* 2.8* 2.9* 2.7* 3.0*     CBG: Recent Labs  Lab 04/22/22 1138 04/22/22 1711 04/22/22 2105 04/23/22 0727 04/23/22 1227  GLUCAP 164* 176* 142* 120* 187*      Recent Results (from the past 240 hour(s))  Culture, blood (Routine x 2)     Status: None   Collection Time: 04/18/22  6:20  PM   Specimen: BLOOD  Result Value Ref Range Status   Specimen Description   Final    BLOOD BLOOD RIGHT WRIST Performed at [facility] [address] [place], [place] [address]    Special Requests   Final    BOTTLES DRAWN AEROBIC AND ANAEROBIC Blood Culture adequate volume Performed at [facility] [address] [place], [place] [address]    Culture   Final    NO GROWTH 5 DAYS Performed at [facility], [place] [address], [place], [place] [address]    Report Status 04/23/2022 FINAL  Final  Culture, blood (Routine x 2)     Status: None   Collection Time: 04/18/22  6:49 PM   Specimen: BLOOD  Result Value Ref Range Status   Specimen Description   Final    BLOOD BLOOD LEFT FOREARM Performed at [facility] [address] [place], [place] [address]    Special Requests   Final    BOTTLES DRAWN AEROBIC AND ANAEROBIC Blood Culture adequate volume Performed at [facility] [address], [place], [place] [address]    Culture   Final    NO GROWTH 5 DAYS Performed at [facility], [place] [address] [place], [place] [address]    Report Status 04/23/2022 FINAL  Final         Radiology Studies: No results found.      Scheduled Meds:  aspirin  81 mg Oral Daily   cefdinir  300 mg Oral Q12H   ciclopirox   Topical BID   clopidogrel  75 mg Oral Daily   ferrous sulfate  325 mg Oral Daily   gabapentin  600 mg Oral TID   insulin aspart  0-15 Units Subcutaneous TID WC   insulin aspart  0-5 Units Subcutaneous QHS   lactulose  10 g Oral TID   mometasone-formoterol  2 puff Inhalation BID   pantoprazole  40 mg Oral Daily   sertraline  100 mg Oral Daily   Continuous Infusions:  thiamine (VITAMIN B1) injection 500 mg (04/23/22 0923)   Followed by   Derrill Memo ON 04/24/2022] thiamine (VITAMIN B1) injection       LOS: 4 days    Time spent: 35 minutes    Irine Seal, MD [facility]   To contact  the attending provider between 7A-7P or the covering provider during after hours 7P-7A, please log into the web site [email or web address] and access using universal [place] password for that web site. If you do not have the password, please call the hospital operator.  04/23/2022, 12:39 PM

## 2022-04-23 NOTE — Progress Notes (Signed)
PT Cancellation Note  Patient Details Name: Barry [name] MRN: [id number] DOB: 1955/10/06   Cancelled Treatment:    Reason Eval/Treat Not Completed: Other (comment)  Checked on pt twice in afternoon and he was on phone with MD offices.  Reports he has walked twice today and request PT at later date.  PT agrees.  Abran Richard, PT Acute Rehab [facility] [phone]  Barry [name] 04/23/2022, 4:47 PM

## 2022-04-23 NOTE — NC FL2 (Signed)
[place] LEVEL OF CARE FORM     IDENTIFICATION  Patient Name: Barry [name] Birthdate: 1955/04/13 Sex: male Admission Date (Current Location): 04/18/2022  [facility] and [place] Number:  Herbalist and Address:  [facility],  [place], [place]      Provider Number: [id number]  Attending Physician Name and Address:  Eugenie Filler, MD  Relative Name and Phone Number:       Current Level of Care: Hospital Recommended Level of Care: [place] Prior Approval Number:    Date Approved/Denied:   PASRR Number: [id number]  Discharge Plan: SNF    Current Diagnoses: Patient Active Problem List   Diagnosis Date Noted   Right hip pain 04/21/2022   Hypertension 04/21/2022   OSA (obstructive sleep apnea) 04/21/2022   AKI (acute kidney injury) ([place]) 04/19/2022   Hypotension [id number]   Acute metabolic encephalopathy [id number]   Manifestations of thiamine deficiency 03/06/2022   Rash and nonspecific skin eruption 03/02/2022   Cirrhosis of liver without ascites ([place]) 03/01/2022   Deficiency anemia 03/01/2022   Chronic seborrheic dermatitis 03/01/2022   Genetic testing 10/30/2019   Family history of colon cancer 10/15/2019   IDA (iron deficiency anemia) 07/14/2018   GERD (gastroesophageal reflux disease) 11/03/2017   Thrombocytopenia ([place]) 08/26/2017   Chronic pain of both shoulders 05/18/2017   Abnormal nuclear stress test    Microalbuminuria due to type 2 diabetes mellitus ([place]) 02/16/2017   Type 2 diabetes mellitus with diabetic neuropathy, with long-term current use of insulin (HCC) 06/20/2015   Chest pain, atypical 03/11/2015   Hyperlipidemia LDL goal <70 03/26/2014   Knee osteoarthritis 09/18/2013   Lumbosacral radiculopathy 09/04/2013   Insomnia 08/14/2013   Essential hypertension, benign 05/15/2013   Erectile dysfunction associated with type 2 diabetes mellitus ([place]) 01/09/2013   Depression with  anxiety 09/05/2012   CAD (coronary artery disease) 06/26/2012   Morbid obesity due to excess calories ([place]) 06/26/2012   Obstructive sleep apnea     Orientation RESPIRATION BLADDER Height & Weight     Self, Time, Situation, Place  Normal Incontinent Weight: 236 lb 5.3 oz (107.2 kg) Height:  5\' 6"  (167.6 cm)  BEHAVIORAL SYMPTOMS/MOOD NEUROLOGICAL BOWEL NUTRITION STATUS      Incontinent Diet (carb modified)  AMBULATORY STATUS COMMUNICATION OF NEEDS Skin   Limited Assist Verbally Normal                       Personal Care Assistance Level of Assistance  Bathing, Feeding, Dressing Bathing Assistance: Limited assistance Feeding assistance: Independent Dressing Assistance: Limited assistance     Functional Limitations Info  Sight, Hearing, Speech Sight Info: Adequate Hearing Info: Adequate Speech Info: Adequate    SPECIAL CARE FACTORS FREQUENCY  PT (By licensed PT), OT (By licensed OT)     PT Frequency: 5 x a week OT Frequency: 5 x a week            Contractures Contractures Info: Not present    Additional Factors Info  Code Status, Allergies Code Status Info: full Allergies Info: No known allergies           Current Medications (04/23/2022):  This is the current hospital active medication list Current Facility-Administered Medications  Medication Dose Route Frequency Provider Last Rate Last Admin   albuterol (PROVENTIL) (2.5 MG/3ML) 0.083% nebulizer solution 3 mL  3 mL Nebulization Q6H PRN Eugenie Filler, MD       aspirin  chewable tablet 81 mg  81 mg Oral Daily Eugenie Filler, MD   81 mg at 04/23/22 [id number]   cefdinir (OMNICEF) capsule 300 mg  300 mg Oral Q12H Eugenie Filler, MD   300 mg at 04/23/22 0917   ciclopirox (LOPROX) 0.77 % cream   Topical BID Eugenie Filler, MD       clopidogrel (PLAVIX) tablet 75 mg  75 mg Oral Daily Georgette Shell, MD   75 mg at 04/23/22 [id number]   ferrous sulfate tablet 325 mg  325 mg Oral Daily Eugenie Filler, MD   325 mg at 04/23/22 0917   fluticasone (CUTIVATE) [id number] % cream 1 Application  1 Application Topical BID PRN Eugenie Filler, MD       gabapentin (NEURONTIN) capsule 600 mg  600 mg Oral TID Georgette Shell, MD   600 mg at 04/23/22 [id number]   insulin aspart (novoLOG) injection 0-15 Units  0-15 Units Subcutaneous TID WC Georgette Shell, MD   3 Units at 04/22/22 1729   insulin aspart (novoLOG) injection 0-5 Units  0-5 Units Subcutaneous QHS Georgette Shell, MD       lactulose (CHRONULAC) 10 GM/15ML solution 10 g  10 g Oral TID Georgette Shell, MD   10 g at 04/23/22 0919   mometasone-formoterol (DULERA) 100-5 MCG/ACT inhaler 2 puff  2 puff Inhalation BID Eugenie Filler, MD   2 puff at 04/23/22 0825   Oral care mouth rinse  15 mL Mouth Rinse PRN Georgette Shell, MD       pantoprazole (PROTONIX) EC tablet 40 mg  40 mg Oral Daily Eugenie Filler, MD   40 mg at 04/23/22 [id number]   sertraline (ZOLOFT) tablet 100 mg  100 mg Oral Daily Georgette Shell, MD   100 mg at 04/23/22 [id number]   thiamine (VITAMIN B1) 500 mg in sodium chloride 0.9 % 50 mL IVPB  500 mg Intravenous Q8H Eugenie Filler, MD 100 mL/hr at 04/23/22 0923 500 mg at 04/23/22 [id number]   Followed by   Derrill Memo ON 04/24/2022] thiamine (VITAMIN B1) 250 mg in sodium chloride 0.9 % 50 mL IVPB  250 mg Intravenous Daily Eugenie Filler, MD       traMADol Veatrice Bourbon) tablet 100 mg  100 mg Oral Q6H PRN Eugenie Filler, MD   100 mg at 04/23/22 [id number]     Discharge Medications: Please see discharge summary for a list of discharge medications.  Relevant Imaging Results:  Relevant Lab Results:   Additional Information SSN [id number]  Arlie Solomons Larna Capelle, LCSW

## 2022-04-23 NOTE — TOC Progression Note (Addendum)
Transition of Care [facility]) - Progression Note    Patient Details  Name: Barry [name] MRN: [id number] Date of Birth: [birth year]  Transition of Care [facility]) CM/SW [place], LCSW Phone Number: 04/23/2022, 4:30 PM  Clinical Narrative:    Insurance auth approved can d/c to [facility] Saturday if medically stable. Will need to contact [facility]. TOC to follow.   Expected Discharge Plan: Skilled Nursing Facility Barriers to Discharge: Continued Medical Work up  Expected Discharge Plan and Services       Living arrangements for the past 2 months: Apartment                                       Social Determinants of Health (SDOH) Interventions SDOH Screenings   Food Insecurity: No Food Insecurity (04/19/2022)  Housing: Low Risk  (04/19/2022)  Transportation Needs: No Transportation Needs (04/19/2022)  Utilities: Not At Risk (04/19/2022)  Depression (PHQ2-9): Low Risk  (05/16/2019)  Tobacco Use: Low Risk  (04/18/2022)    Readmission Risk Interventions     No data to display

## 2022-04-23 NOTE — Care Management Important Message (Signed)
Important Message  Patient Details IM Letter given. Name: Barry [name] MRN: [id number] Date of Birth: 06-06-1955   Medicare Important Message Given:  Yes     Kerin Salen 04/23/2022, 11:12 AM

## 2022-04-24 ENCOUNTER — Other Ambulatory Visit: Payer: Self-pay

## 2022-04-24 ENCOUNTER — Observation Stay (HOSPITAL_COMMUNITY)
Admission: EM | Admit: 2022-04-24 | Discharge: 2022-04-28 | Disposition: A | Discharge status: Skilled Nursing Facility | Payer: Medicare HMO | Attending: Internal Medicine | Admitting: Internal Medicine

## 2022-04-24 ENCOUNTER — Encounter (HOSPITAL_COMMUNITY): Payer: Self-pay | Admitting: Internal Medicine

## 2022-04-24 DIAGNOSIS — Z7902 Long term (current) use of antithrombotics/antiplatelets: Secondary | ICD-10-CM | POA: Insufficient documentation

## 2022-04-24 DIAGNOSIS — M25551 Pain in right hip: Secondary | ICD-10-CM | POA: Diagnosis not present

## 2022-04-24 DIAGNOSIS — D869 Sarcoidosis, unspecified: Secondary | ICD-10-CM | POA: Diagnosis not present

## 2022-04-24 DIAGNOSIS — Z7982 Long term (current) use of aspirin: Secondary | ICD-10-CM | POA: Diagnosis not present

## 2022-04-24 DIAGNOSIS — F418 Other specified anxiety disorders: Secondary | ICD-10-CM | POA: Diagnosis not present

## 2022-04-24 DIAGNOSIS — I25118 Atherosclerotic heart disease of native coronary artery with other forms of angina pectoris: Secondary | ICD-10-CM

## 2022-04-24 DIAGNOSIS — E1140 Type 2 diabetes mellitus with diabetic neuropathy, unspecified: Secondary | ICD-10-CM | POA: Insufficient documentation

## 2022-04-24 DIAGNOSIS — K219 Gastro-esophageal reflux disease without esophagitis: Secondary | ICD-10-CM | POA: Diagnosis present

## 2022-04-24 DIAGNOSIS — Z794 Long term (current) use of insulin: Secondary | ICD-10-CM | POA: Diagnosis not present

## 2022-04-24 DIAGNOSIS — I2510 Atherosclerotic heart disease of native coronary artery without angina pectoris: Secondary | ICD-10-CM | POA: Insufficient documentation

## 2022-04-24 DIAGNOSIS — Z951 Presence of aortocoronary bypass graft: Secondary | ICD-10-CM | POA: Insufficient documentation

## 2022-04-24 DIAGNOSIS — D696 Thrombocytopenia, unspecified: Secondary | ICD-10-CM | POA: Diagnosis not present

## 2022-04-24 DIAGNOSIS — F4323 Adjustment disorder with mixed anxiety and depressed mood: Secondary | ICD-10-CM | POA: Insufficient documentation

## 2022-04-24 DIAGNOSIS — Z7984 Long term (current) use of oral hypoglycemic drugs: Secondary | ICD-10-CM | POA: Diagnosis not present

## 2022-04-24 DIAGNOSIS — K7460 Unspecified cirrhosis of liver: Secondary | ICD-10-CM | POA: Diagnosis present

## 2022-04-24 DIAGNOSIS — Z5987 Material hardship due to limited financial resources, not elsewhere classified: Secondary | ICD-10-CM

## 2022-04-24 DIAGNOSIS — G4733 Obstructive sleep apnea (adult) (pediatric): Secondary | ICD-10-CM | POA: Diagnosis not present

## 2022-04-24 DIAGNOSIS — R188 Other ascites: Secondary | ICD-10-CM | POA: Diagnosis present

## 2022-04-24 DIAGNOSIS — G4730 Sleep apnea, unspecified: Secondary | ICD-10-CM | POA: Diagnosis present

## 2022-04-24 DIAGNOSIS — E785 Hyperlipidemia, unspecified: Secondary | ICD-10-CM | POA: Diagnosis present

## 2022-04-24 DIAGNOSIS — I10 Essential (primary) hypertension: Secondary | ICD-10-CM | POA: Diagnosis present

## 2022-04-24 DIAGNOSIS — Z79899 Other long term (current) drug therapy: Secondary | ICD-10-CM | POA: Diagnosis not present

## 2022-04-24 LAB — CBC
HCT: 31.6 % — ABNORMAL LOW (ref 39.0–52.0)
Hemoglobin: 10.3 g/dL — ABNORMAL LOW (ref 13.0–17.0)
MCH: 34.4 pg — ABNORMAL HIGH (ref 26.0–34.0)
MCHC: 32.6 g/dL (ref 30.0–36.0)
MCV: 105.7 fL — ABNORMAL HIGH (ref 80.0–100.0)
Platelets: 52 10*3/uL — ABNORMAL LOW (ref 150–400)
RBC: 2.99 MIL/uL — ABNORMAL LOW (ref 4.22–5.81)
RDW: 17 % — ABNORMAL HIGH (ref 11.5–15.5)
WBC: 3.6 10*3/uL — ABNORMAL LOW (ref 4.0–10.5)
nRBC: 0 % (ref 0.0–0.2)

## 2022-04-24 LAB — COMPREHENSIVE METABOLIC PANEL
ALT: 68 U/L — ABNORMAL HIGH (ref 0–44)
AST: 90 U/L — ABNORMAL HIGH (ref 15–41)
Albumin: 2.6 g/dL — ABNORMAL LOW (ref 3.5–5.0)
Alkaline Phosphatase: 120 U/L (ref 38–126)
Anion gap: 5 (ref 5–15)
BUN: 14 mg/dL (ref 8–23)
CO2: 25 mmol/L (ref 22–32)
Calcium: 7.9 mg/dL — ABNORMAL LOW (ref 8.9–10.3)
Chloride: 105 mmol/L (ref 98–111)
Creatinine, Ser: 0.77 mg/dL (ref 0.61–1.24)
GFR, Estimated: >60 mL/min (ref >60)
Glucose, Bld: 246 mg/dL — ABNORMAL HIGH (ref 70–99)
Potassium: 3.9 mmol/L (ref 3.5–5.1)
Sodium: 135 mmol/L (ref 135–145)
Total Bilirubin: 0.6 mg/dL (ref 0.3–1.2)
Total Protein: 6.2 g/dL — ABNORMAL LOW (ref 6.5–8.1)

## 2022-04-24 LAB — GLUCOSE, CAPILLARY
Glucose-Capillary: 182 mg/dL — ABNORMAL HIGH (ref 70–99)
Glucose-Capillary: 107 mg/dL — ABNORMAL HIGH (ref 70–99)
Glucose-Capillary: 144 mg/dL — ABNORMAL HIGH (ref 70–99)
Glucose-Capillary: 209 mg/dL — ABNORMAL HIGH (ref 70–99)

## 2022-04-24 MED ORDER — SODIUM CHLORIDE 0.9% FLUSH
3.0000 mL | Freq: Two times a day (BID) | INTRAVENOUS | Status: DC
  Administered 2022-04-26 (×2): 3 mL via INTRAVENOUS

## 2022-04-24 MED ORDER — SENNA 8.6 MG PO TABS
1.0000 | ORAL_TABLET | Freq: Two times a day (BID) | ORAL | Status: DC
  Administered 2022-04-24 – 2022-04-28 (×8): 8.6 mg via ORAL
  Filled 2022-04-24 (×8): qty 1

## 2022-04-24 MED ORDER — ACETAMINOPHEN 325 MG PO TABS: 650.0000 mg | ORAL_TABLET | Freq: Four times a day (QID) | ORAL | Status: DC | PRN

## 2022-04-24 MED ORDER — FERROUS SULFATE 325 (65 FE) MG PO TABS
325.0000 mg | ORAL_TABLET | Freq: Every day | ORAL | Status: DC
  Administered 2022-04-25 – 2022-04-28 (×4): 325 mg via ORAL
  Filled 2022-04-24 (×4): qty 1

## 2022-04-24 MED ORDER — FLUTICASONE PROPIONATE 0.05 % EX CREA: 1.0000 | TOPICAL_CREAM | Freq: Two times a day (BID) | CUTANEOUS | Status: DC | PRN

## 2022-04-24 MED ORDER — ASPIRIN 81 MG PO CHEW
81.0000 mg | CHEWABLE_TABLET | Freq: Every day | ORAL | Status: DC
  Administered 2022-04-25 – 2022-04-28 (×4): 81 mg via ORAL
  Filled 2022-04-24 (×4): qty 1

## 2022-04-24 MED ORDER — METOPROLOL TARTRATE 12.5 MG HALF TABLET: 12.5000 mg | ORAL_TABLET | Freq: Two times a day (BID) | ORAL | Status: DC

## 2022-04-24 MED ORDER — OXYCODONE-ACETAMINOPHEN 10-325 MG PO TABS: 1.0000 | ORAL_TABLET | Freq: Three times a day (TID) | ORAL | Status: DC | PRN

## 2022-04-24 MED ORDER — SORBITOL 70 % SOLN: 30.0000 mL | Freq: Every day | Status: DC | PRN

## 2022-04-24 MED ORDER — ACETAMINOPHEN 500 MG PO TABS: 500.0000 mg | ORAL_TABLET | Freq: Three times a day (TID) | ORAL | 0 refills | Status: DC | PRN

## 2022-04-24 MED ORDER — LACTULOSE 10 GM/15ML PO SOLN
10.0000 g | Freq: Three times a day (TID) | ORAL | Status: DC
  Administered 2022-04-24 – 2022-04-28 (×11): 10 g via ORAL
  Filled 2022-04-24 (×11): qty 15

## 2022-04-24 MED ORDER — ONDANSETRON HCL 4 MG/2ML IJ SOLN: 4.0000 mg | Freq: Four times a day (QID) | INTRAMUSCULAR | Status: DC | PRN

## 2022-04-24 MED ORDER — POLYETHYLENE GLYCOL 3350 17 G PO PACK
17.0000 g | PACK | Freq: Every day | ORAL | Status: DC | PRN
  Administered 2022-04-27: 17 g via ORAL
  Filled 2022-04-24: qty 1

## 2022-04-24 MED ORDER — ONDANSETRON HCL 4 MG PO TABS: 4.0000 mg | ORAL_TABLET | Freq: Four times a day (QID) | ORAL | Status: DC | PRN

## 2022-04-24 MED ORDER — OXYCODONE HCL 5 MG PO TABS
5.0000 mg | ORAL_TABLET | Freq: Three times a day (TID) | ORAL | Status: DC | PRN
  Administered 2022-04-24 – 2022-04-28 (×5): 5 mg via ORAL
  Filled 2022-04-24 (×5): qty 1

## 2022-04-24 MED ORDER — INSULIN ASPART 100 UNIT/ML IJ SOLN
0.0000 [IU] | Freq: Three times a day (TID) | INTRAMUSCULAR | Status: DC
  Administered 2022-04-24 – 2022-04-25 (×3): 2 [IU] via SUBCUTANEOUS
  Administered 2022-04-26: 3 [IU] via SUBCUTANEOUS
  Administered 2022-04-26: 2 [IU] via SUBCUTANEOUS
  Administered 2022-04-26: 3 [IU] via SUBCUTANEOUS
  Administered 2022-04-27: 2 [IU] via SUBCUTANEOUS
  Administered 2022-04-27: 3 [IU] via SUBCUTANEOUS
  Administered 2022-04-27 – 2022-04-28 (×2): 2 [IU] via SUBCUTANEOUS

## 2022-04-24 MED ORDER — ATORVASTATIN CALCIUM 80 MG PO TABS: 80.0000 mg | ORAL_TABLET | Freq: Every day | ORAL | Status: DC

## 2022-04-24 MED ORDER — LACTULOSE 10 GM/15ML PO SOLN: 10.0000 g | Freq: Three times a day (TID) | ORAL | 0 refills | Status: DC

## 2022-04-24 MED ORDER — TRAMADOL HCL 50 MG PO TABS: 100.0000 mg | ORAL_TABLET | Freq: Four times a day (QID) | ORAL | 0 refills | Status: DC | PRN

## 2022-04-24 MED ORDER — GABAPENTIN 300 MG PO CAPS
300.0000 mg | ORAL_CAPSULE | Freq: Two times a day (BID) | ORAL | Status: DC
  Administered 2022-04-24 – 2022-04-28 (×8): 300 mg via ORAL
  Filled 2022-04-24 (×8): qty 1

## 2022-04-24 MED ORDER — ALBUTEROL SULFATE HFA 108 (90 BASE) MCG/ACT IN AERS: 2.0000 | INHALATION_SPRAY | Freq: Four times a day (QID) | RESPIRATORY_TRACT | Status: DC | PRN

## 2022-04-24 MED ORDER — OXYCODONE-ACETAMINOPHEN 5-325 MG PO TABS
1.0000 | ORAL_TABLET | Freq: Three times a day (TID) | ORAL | Status: DC | PRN
  Administered 2022-04-25: 1 via ORAL
  Filled 2022-04-24: qty 1

## 2022-04-24 MED ORDER — BUSPIRONE HCL 5 MG PO TABS
5.0000 mg | ORAL_TABLET | Freq: Three times a day (TID) | ORAL | Status: DC
  Administered 2022-04-24 – 2022-04-28 (×11): 5 mg via ORAL
  Filled 2022-04-24 (×6): qty 1
  Filled 2022-04-24: qty 0.5
  Filled 2022-04-24 (×5): qty 1

## 2022-04-24 MED ORDER — SERTRALINE HCL 100 MG PO TABS
100.0000 mg | ORAL_TABLET | Freq: Every day | ORAL | Status: DC
  Administered 2022-04-25 – 2022-04-28 (×4): 100 mg via ORAL
  Filled 2022-04-24 (×4): qty 1

## 2022-04-24 MED ORDER — CYCLOBENZAPRINE HCL 10 MG PO TABS
10.0000 mg | ORAL_TABLET | Freq: Two times a day (BID) | ORAL | Status: DC | PRN
  Administered 2022-04-24: 10 mg via ORAL
  Filled 2022-04-24 (×2): qty 1

## 2022-04-24 MED ORDER — PANTOPRAZOLE SODIUM 40 MG PO TBEC
40.0000 mg | DELAYED_RELEASE_TABLET | Freq: Every day | ORAL | Status: DC
  Administered 2022-04-25 – 2022-04-28 (×4): 40 mg via ORAL
  Filled 2022-04-24 (×4): qty 1

## 2022-04-24 MED ORDER — THIAMINE MONONITRATE 100 MG PO TABS
100.0000 mg | ORAL_TABLET | Freq: Every day | ORAL | Status: DC
  Administered 2022-04-25 – 2022-04-28 (×3): 100 mg via ORAL
  Filled 2022-04-24 (×5): qty 1

## 2022-04-24 MED ORDER — CLOPIDOGREL BISULFATE 75 MG PO TABS
75.0000 mg | ORAL_TABLET | Freq: Every day | ORAL | Status: DC
  Administered 2022-04-25 – 2022-04-28 (×4): 75 mg via ORAL
  Filled 2022-04-24 (×4): qty 1

## 2022-04-24 MED ORDER — ALBUTEROL SULFATE (2.5 MG/3ML) 0.083% IN NEBU: 2.5000 mg | INHALATION_SOLUTION | RESPIRATORY_TRACT | Status: DC | PRN

## 2022-04-24 MED ORDER — CICLOPIROX OLAMINE 0.77 % EX CREA: TOPICAL_CREAM | Freq: Two times a day (BID) | CUTANEOUS | Status: DC

## 2022-04-24 MED ORDER — MOMETASONE FURO-FORMOTEROL FUM 100-5 MCG/ACT IN AERO
2.0000 | INHALATION_SPRAY | Freq: Two times a day (BID) | RESPIRATORY_TRACT | Status: DC
  Administered 2022-04-24 – 2022-04-28 (×7): 2 via RESPIRATORY_TRACT
  Filled 2022-04-24: qty 8.8

## 2022-04-24 MED ORDER — OXYCODONE-ACETAMINOPHEN 10-325 MG PO TABS: 1.0000 | ORAL_TABLET | Freq: Three times a day (TID) | ORAL | 0 refills | Status: DC | PRN

## 2022-04-24 MED ORDER — ACETAMINOPHEN 650 MG RE SUPP: 650.0000 mg | Freq: Four times a day (QID) | RECTAL | Status: DC | PRN

## 2022-04-24 MED ORDER — ACETAMINOPHEN 500 MG PO TABS: 500.0000 mg | ORAL_TABLET | Freq: Three times a day (TID) | ORAL | Status: DC | PRN

## 2022-04-24 NOTE — ED Provider Notes (Signed)
[name] Provider Note   CSN: [id number] Arrival date & time: 04/24/22  1454     History  Chief Complaint  Patient presents with   Pt needs C-Pap machine prior to acceptance to facilty    Barry [name] is a 67 y.o. male who presents emergency department because he was sent back from skilled nursing facility because he does not have a CPAP.  She was discharged this morning at 11:24 AM from the hospitalist service.  Original plan was for his friend to pick his up from his ex-wife's house however the CPAP does not work.  The facility is unable to order the CPAP on this holiday weekend and cannot keep him in their facility due to lack of equipment.  He has no other complaints  HPI     Home Medications Prior to Admission medications   Medication Sig Start Date End Date Taking? Authorizing Provider  acetaminophen (TYLENOL) 500 MG tablet Take 1 tablet (500 mg total) by mouth every 8 (eight) hours as needed for moderate pain. 04/24/22   Eugenie Filler, MD  albuterol (PROVENTIL HFA;VENTOLIN HFA) 108 [id number] Base) MCG/ACT inhaler Inhale 2 puffs into the lungs every 6 (six) hours as needed for wheezing or shortness of breath. 04/21/17   Gildardo Cranker, DO  aspirin 81 MG chewable tablet Chew 81 mg by mouth daily.     [provider]  atorvastatin (LIPITOR) 80 MG tablet Take 1 tablet (80 mg total) by mouth daily. 05/08/22   Eugenie Filler, MD  Blood Glucose Monitoring Suppl (CONTOUR NEXT EZ MONITOR) w/Device KIT Test blood sugar three times daily E11.22 03/08/16   Tivis Ringer, RPH-CPP  busPIRone (BUSPAR) 15 MG tablet TAKE 1 TABLET BY MOUTH THREE TIMES DAILY FOR ANXIETY 07/19/17   Gildardo Cranker, DO  ciclopirox (LOPROX) 0.77 % cream Apply topically 2 (two) times daily. 03/01/22   Janith Lima, MD  clopidogrel (PLAVIX) 75 MG tablet APPOINTMENT OVERDUE Take 1 by mouth daily Patient taking differently: Take 75 mg by mouth daily. 10/31/19    [name], NP  cyclobenzaprine (FLEXERIL) 10 MG tablet Take 10 mg by mouth 2 (two) times daily as needed. 07/23/19   [provider]  FEROSUL 325 (65 Fe) MG tablet Take 325 mg by mouth daily. 10/13/21   [provider]  fluticasone (CUTIVATE) 0.05 % cream Apply 1 Application topically 2 (two) times daily as needed (for itching- face).    [provider]  gabapentin (NEURONTIN) 300 MG capsule Take 300 mg by mouth in the morning and at bedtime. 09/12/19   [provider]  lactulose (CHRONULAC) 10 GM/15ML solution Take 15 mLs (10 g total) by mouth 3 (three) times daily. 04/24/22   Eugenie Filler, MD  metFORMIN (GLUCOPHAGE) 1000 MG tablet TAKE 1 TABLET(1000 MG) BY MOUTH TWICE DAILY Patient taking differently: Take 1,000 mg by mouth 2 (two) times daily with a meal. 09/09/17   Gildardo Cranker, DO  metoprolol tartrate (LOPRESSOR) 25 MG tablet Take 0.5 tablets (12.5 mg total) by mouth 2 (two) times daily. 05/18/17   Gildardo Cranker, DO  oxyCODONE-acetaminophen (PERCOCET) 10-325 MG tablet Take 1 tablet by mouth every 8 (eight) hours as needed for pain. 04/24/22   Eugenie Filler, MD  pantoprazole (PROTONIX) 40 MG tablet TAKE 1 TABLET BY MOUTH EVERY DAY 04/12/22   Volanda Napoleon, MD  sertraline (ZOLOFT) 100 MG tablet TAKE 2 TABLETS(200 MG) BY MOUTH DAILY Patient taking differently:  Take 100 mg by mouth daily. 04/20/19   [name], NP  SYMBICORT 80-4.5 MCG/ACT inhaler Inhale 2 puffs into the lungs in the morning and at bedtime. 11/16/21   [provider]  thiamine (VITAMIN B-1) 100 MG tablet Take 1 tablet (100 mg total) by mouth daily. 03/06/22   Janith Lima, MD      Allergies    Patient has no known allergies.    Review of Systems   Review of Systems  Physical Exam Updated Vital Signs BP 119/88   Pulse 64   Temp 98.5 F (36.9 C) (Oral)   Resp 18   SpO2 97%  Physical Exam Vitals and nursing note reviewed.  Constitutional:       General: He is not in acute distress.    Appearance: He is well-developed. He is not diaphoretic.  HENT:     Head: Normocephalic and atraumatic.  Eyes:     General: No scleral icterus.    Conjunctiva/sclera: Conjunctivae normal.  Cardiovascular:     Rate and Rhythm: Normal rate and regular rhythm.     Heart sounds: Normal heart sounds.  Pulmonary:     Effort: Pulmonary effort is normal. No respiratory distress.     Breath sounds: Normal breath sounds.  Abdominal:     Palpations: Abdomen is soft.     Tenderness: There is no abdominal tenderness.  Musculoskeletal:     Cervical back: Normal range of motion and neck supple.  Skin:    General: Skin is warm and dry.  Neurological:     Mental Status: He is alert.  Psychiatric:        Behavior: Behavior normal.     ED Results / Procedures / Treatments   Labs (all labs ordered are listed, but only abnormal results are displayed) Labs Reviewed - No data to display  EKG None  Radiology No results found.  Procedures Procedures    Medications Ordered in ED Medications - No data to display  ED Course/ Medical Decision Making/ A&P                             Medical Decision Making Risk Decision regarding hospitalization.    This is a failed discharge of a 67 year old gentleman who is on his way to skilled nursing facility and then rejected due to lack of medical equipment.        Final Clinical Impression(s) / ED Diagnoses Final diagnoses:  Unable to obtain clinical equipment or device due to limited financial resources    Rx / DC Orders ED Discharge Orders     None         Margarita Mail, PA-C 04/24/22 1541    Teressa Lower, MD 04/25/22 1323

## 2022-04-24 NOTE — Discharge Summary (Signed)
Physician Discharge Summary  Barry [name] [id number] DOB: 10/17/1955 DOA: 04/18/2022  PCP: Janith Lima, MD  Admit date: 04/18/2022 Discharge date: 04/24/2022  Time spent: 60 minutes  Recommendations for Outpatient Follow-up:  Follow-up with MD at skilled nursing facility.  Patient will need a comprehensive metabolic profile in 1 week to follow-up on electrolytes, renal function, LFTs.  Patient will need a CBC done to follow-up on counts, patient with a chronic thrombocytopenia.   Discharge Diagnoses:  Principal Problem:   Acute metabolic encephalopathy Active Problems:   CAD (coronary artery disease)   Hyperlipidemia LDL goal <70   Type 2 diabetes mellitus with diabetic neuropathy, with long-term current use of insulin (HCC)   Cirrhosis of liver without ascites (HCC)   Hypotension   AKI (acute kidney injury) ([facility])   Right hip pain   Hypertension   OSA (obstructive sleep apnea)   Discharge Condition: Stable and improved.  Diet recommendation: Carb modified diet  Filed Weights   04/18/22 1810 04/19/22 0410  Weight: 105.7 kg 107.2 kg    History of present illness:  HPI per Dr. Cherene Altes is a 67 y.o. male with medical history significant of CAD s/p CAGB x3, HTN, HLD, cirrhosis, insulin dependent T2DM, OSA, obesity who presents with AMS.   Pt was very lethargic and chronically ill appearing on exam. He awoke to voice and light touch but drifts back to sleep. He was able to tell me [place] and [place] Nodded his head when asked if he has been feeling sick but unable to tell me his symptoms.  He was able to follow commands with lifting his extremities and turning for me to evaluate his back.  However continued to be drowsy and asleep throughout most of exam.   He had a notable smaller pupil on the left compared to the right but noted on CT head to have a right globe prosthesis.Had audible gargling of sputum with breathing with CXR finding of small  bilateral pleural effusions.   No findings of any skin wound or breakdown.    He was afebrile, hypotensive with BP of 92 over 60s on room air. no leukocytosis, hemoglobin 12, chronic thrombocytopenia with platelet of 76.   Sodium of 135, K of 4.2, creatinine elevated at 1.11 from prior of 0.81.   Had elevated AST and ALT recently but this is increased at 158 and 120 respectively.   INR and PTT elevated at 1.3/15.6.   Ammonia level also elevated at 54.   pH was reassuring at 7.4 with normal bicarb.   UA was negative but had hyaline cast. UDS positive for benzo and opioids.   Tylenol level of 17, EtOH of less than 10.   He had meticulous records of his medical hx at bedside inside folders. Noted to have low thiamine level of 7 in February and was started on supplementations.    Had endoscopy on 06/17/2021 with Grade I varices and moderate portal hypertensive gastropathy.   Hospital Course:  #1 acute metabolic encephalopathy -??  Etiology. -Differential included dehydration as patient noted to have ketones in his urine and elevated creatinine with dark urine in the setting of thiamine deficiency. -UDS done was positive for benzos and opioids which patient was on prior to admission. -Urinalysis leukocyte negative, nitrite negative, WBCs negative. -Patient noted to have a low vitamin B1 level of 7, TSH within normal limits, vitamin B12 level of 805, folate of 13.6. -CT head with no  acute findings. -Chest x-ray done with no acute infiltrate showed small bilateral pleural effusion. -Initial ammonia level noted at 54. -Patient received high-dose vitamin B1 500 mg every 8 hours x 2 days. -Patient given 3 more days of IV vitamin B1 500 mg every 8 hours during the hospitalization and subsequently decreased to 250 mg IV daily.   -Patient was discharged back on home regimen of vitamin B1 100 mg daily.   -Patient also empirically treated with IV Rocephin, subsequently transition to oral Omnicef  and completed a 6-day course of antibiotic treatment.   -Patient improved clinically and likely close to baseline by day of discharge.   -No further antibiotics needed on discharge.     2.  History of cirrhosis/EGD in the past showed portal gastropathy and grade 1 varices -LFTs elevated. -Acute hepatitis panel with reactive hep B surface antigen, HCV antibody nonreactive, hepatitis A antibody IgM nonreactive, hep B core antibody IgM equivocal. -HIV nonreactive. -Noted not to be on lactulose prior to admission. -Patient started on low-dose lactulose 10 g 3 times daily for goal bowel movements of approximately 3-5 bowel movements a day. -Outpatient follow-up.   3.  Diabetes mellitus type 2 -Hemoglobin A1c 6.7 (03/01/2022) -Patient's oral hypoglycemic agents were held during the hospitalization the patient maintained on sliding scale insulin.   -Oral hypoglycemic agents will be resumed on discharge.    4.  OSA -CPAP nightly.   5.  Hypertension -BP remained stable during the hospitalization.   -Home regimen antihypertensive medications were held.   -ACE inhibitor will be discontinued on discharge.   -Beta-blocker will be resumed on discharge.    6.  Hyperlipidemia -Statin held during the hospitalization will be resumed on 2 weeks post discharge.    7.  CAD/history of CABG -Noted to have been on aspirin, Lipitor, lisinopril, Lopressor prior to admission which were initially held.   -Aspirin and Plavix resumed.   -Statin held and will be resumed 2 weeks post discharge. -Blood pressure stable/soft, and as such patient's lisinopril and Lopressor were held during the hospitalization.   -Lisinopril will be discontinued on discharge and Lopressor resumed.   -Outpatient follow-up.    8.  Chronic thrombocytopenia secondary to cirrhosis -Was on Lovenox which has been discontinued. -Patient with no overt bleeding.   -Platelet count stabilized at 52K by day of discharge.    9.  Right hip  pain/DJD -Patient states he is scheduled to have his right hip replacement in the near future and recently seen by cardiology for preop clearance which has been done and patient cleared for surgery. -Patient scheduled for elective surgery for total hip arthroplasty on 05/19/2022 with Dr. Lyla Glassing. -Outpatient follow-up.   10.  Sarcoidosis -Patient told Dr. Zigmund  recently been diagnosed with sarcoidosis after biopsy of his skin from the right back area approximately 3 weeks ago. -Outpatient follow-up.    Procedures: CT head 04/18/2022 Chest x-ray 04/18/2022  Consultations: None  Discharge Exam: Vitals:   04/23/22 2106 04/24/22 0524  BP: (!) 110/48 (!) 107/58  Pulse: 64 60  Resp: 16 20  Temp: 98.4 F (36.9 C) 97.8 F (36.6 C)  SpO2: 97% 98%    General: NAD Cardiovascular: RRR no murmurs rubs or gallops.  No JVD.  No lower extremity edema. Respiratory: Clear to auscultation bilaterally.  No wheezes, no crackles, no rhonchi.  Fair air movement.  Speaking in full sentences.  Discharge Instructions   Discharge Instructions     Diet Carb Modified   Complete by: As  directed    Increase activity slowly   Complete by: As directed       Allergies as of 04/24/2022   No Known Allergies      Medication List     STOP taking these medications    lisinopril 5 MG tablet Commonly known as: ZESTRIL       TAKE these medications    acetaminophen 500 MG tablet Commonly known as: TYLENOL Take 1 tablet (500 mg total) by mouth every 8 (eight) hours as needed for moderate pain. What changed:  how much to take Another medication with the same name was removed. Continue taking this medication, and follow the directions you see here.   albuterol 108 (90 Base) MCG/ACT inhaler Commonly known as: VENTOLIN HFA Inhale 2 puffs into the lungs every 6 (six) hours as needed for wheezing or shortness of breath.   aspirin 81 MG chewable tablet Chew 81 mg by mouth daily.    atorvastatin 80 MG tablet Commonly known as: LIPITOR Take 1 tablet (80 mg total) by mouth daily. Start taking on: May 08, 2022 What changed:  when to take this These instructions start on May 08, 2022. If you are unsure what to do until then, ask your doctor or other care provider.   busPIRone 15 MG tablet Commonly known as: BUSPAR TAKE 1 TABLET BY MOUTH THREE TIMES DAILY FOR ANXIETY   ciclopirox 0.77 % cream Commonly known as: Loprox Apply topically 2 (two) times daily.   clopidogrel 75 MG tablet Commonly known as: PLAVIX APPOINTMENT OVERDUE Take 1 by mouth daily What changed:  how much to take how to take this when to take this additional instructions   CONTOUR NEXT EZ MONITOR w/Device Kit Test blood sugar three times daily E11.22   cyclobenzaprine 10 MG tablet Commonly known as: FLEXERIL Take 10 mg by mouth 2 (two) times daily as needed.   FeroSul 325 (65 FE) MG tablet Generic drug: ferrous sulfate Take 325 mg by mouth daily.   fluticasone 0.05 % cream Commonly known as: CUTIVATE Apply 1 Application topically 2 (two) times daily as needed (for itching- face).   gabapentin 300 MG capsule Commonly known as: NEURONTIN Take 300 mg by mouth in the morning and at bedtime.   lactulose 10 GM/15ML solution Commonly known as: CHRONULAC Take 15 mLs (10 g total) by mouth 3 (three) times daily.   metFORMIN 1000 MG tablet Commonly known as: GLUCOPHAGE TAKE 1 TABLET(1000 MG) BY MOUTH TWICE DAILY What changed: See the new instructions.   metoprolol tartrate 25 MG tablet Commonly known as: LOPRESSOR Take 0.5 tablets (12.5 mg total) by mouth 2 (two) times daily.   oxyCODONE-acetaminophen 10-325 MG tablet Commonly known as: PERCOCET Take 1 tablet by mouth every 8 (eight) hours as needed for pain.   pantoprazole 40 MG tablet Commonly known as: PROTONIX TAKE 1 TABLET BY MOUTH EVERY DAY   sertraline 100 MG tablet Commonly known as: ZOLOFT TAKE 2 TABLETS(200 MG)  BY MOUTH DAILY What changed: See the new instructions.   Symbicort 80-4.5 MCG/ACT inhaler Generic drug: budesonide-formoterol Inhale 2 puffs into the lungs in the morning and at bedtime.   thiamine 100 MG tablet Commonly known as: Vitamin B-1 Take 1 tablet (100 mg total) by mouth daily.       No Known Allergies  Contact information for follow-up providers     MD at SNF. Follow up.               Contact information for  after-discharge care     Destination     [facility] Preferred SNF .   Service: Skilled Nursing Contact information: [address] [place] [phone]                      The results of significant diagnostics from this hospitalization (including imaging, microbiology, ancillary and laboratory) are listed below for reference.    Significant Diagnostic Studies: CT Head Wo Contrast  Result Date: 04/18/2022 CLINICAL DATA:  Mental status changes, unknown cause. EXAM: CT HEAD WITHOUT CONTRAST TECHNIQUE: Contiguous axial images were obtained from the base of the skull through the vertex without intravenous contrast. RADIATION DOSE REDUCTION: This exam was performed according to the departmental dose-optimization program which includes automated exposure control, adjustment of the mA and/or kV according to patient size and/or use of iterative reconstruction technique. COMPARISON:  Prior head CTs are unavailable in PACS at this time. Comparison is made with report of the most recent head CTs dated 04/01/2020 and 04/06/2020 FINDINGS: Brain: Some images are motion limited with loss of fine detail. There is mild cerebral atrophy and small-vessel disease. The ventricles are normal in size and position. Cerebellum and brainstem are unremarkable. No old territorial infarct is seen. Allowing for motion no acute cortical based infarct, hemorrhage, or mass effect is evident. There is no midline shift. The basal cisterns are  clear. Vascular: There are patchy calcifications of the carotid siphons. No hyperdense central vessel is seen. Skull: Negative for fractures or focal lesions. Sinuses/Orbits: No acute orbital findings. Old left lens replacement. A right globe prosthesis. Clear sinuses and mastoid air cells. Other: None. IMPRESSION: No acute intracranial CT findings. Mild atrophy and small-vessel disease. Carotid atherosclerosis. Electronically Signed   By: Telford Nab M.D.   On: 04/18/2022 20:21   DG Chest Port 1 View  Result Date: 04/18/2022 CLINICAL DATA:  Sepsis. Weak and lethargic. Dysuria and dark urine for 3 days. EXAM: PORTABLE CHEST 1 VIEW COMPARISON:  12/30/2021 FINDINGS: Postoperative changes in the mediastinum. Shallow inspiration. Heart size and pulmonary vascularity are normal for technique. Probable small bilateral pleural effusions with basilar atelectasis. No pneumothorax. Mediastinal contours appear intact. IMPRESSION: Shallow inspiration. Probable small pleural effusions with basilar atelectasis. Electronically Signed   By: Lucienne Capers M.D.   On: 04/18/2022 19:32   MYOCARDIAL PERFUSION IMAGING  Result Date: 04/09/2022   The study is normal. The study is low risk.   No ST deviation was noted.   LV perfusion is normal. There is no evidence of ischemia. There is no evidence of infarction.   Left ventricular function is normal. End diastolic cavity size is normal. End systolic cavity size is normal.   Prior study available for comparison from 04/01/2017. Normal stress nuclear study with no ischemia or infarction; EF 62 with normal wall motion.   ECHOCARDIOGRAM COMPLETE  Result Date: 04/09/2022    ECHOCARDIOGRAM REPORT   Patient Name:   CARSTON BEHRNS Mckey    Date of Exam: 04/09/2022 Medical Rec #:  [id number]     Height:       66.0 in Accession #:    [id number]    Weight:       233.0 lb Date of Birth:  1955/06/12      BSA:          2.134 m Patient Age:    16 years      BP:           122/60 mmHg Patient  Gender: M              HR:           86 bpm. Exam Location:  [address] Procedure: 2D Echo, Cardiac Doppler, Color Doppler and Intracardiac            Opacification Agent Indications:    I25.10 Coronary artery disease                 I10 Hypertension  History:        Patient has prior history of Echocardiogram examinations, most                 recent 03/30/2017. Previous Myocardial Infarction and CAD, Prior                 CABG; Risk Factors:Hypertension and Diabetes.  Sonographer:    Wilford Sports Rodgers-Jones RDCS Referring Phys: [place]  1. Left ventricular ejection fraction, by estimation, is >75%. The left ventricle has hyperdynamic function. The left ventricle has no regional wall motion abnormalities. There is mild asymmetric left ventricular hypertrophy of the basal-septal segment.  Indeterminate diastolic filling due to E-A fusion.  2. Right ventricular systolic function is normal. The right ventricular size is normal. There is normal pulmonary artery systolic pressure. The estimated right ventricular systolic pressure is Q000111Q mmHg.  3. No evidence of mitral valve regurgitation.  4. Aortic valve regurgitation is not visualized.  5. The inferior vena cava is normal in size with greater than 50% respiratory variability, suggesting right atrial pressure of 3 mmHg. FINDINGS  Left Ventricle: Left ventricular ejection fraction, by estimation, is >75%. The left ventricle has hyperdynamic function. The left ventricle has no regional wall motion abnormalities. Definity contrast agent was given IV to delineate the left ventricular endocardial borders. The left ventricular internal cavity size was normal in size. There is mild asymmetric left ventricular hypertrophy of the basal-septal segment. Indeterminate diastolic filling due to E-A fusion. Right Ventricle: The right ventricular size is normal. Right ventricular systolic function is normal. There is normal pulmonary artery systolic pressure. The  tricuspid regurgitant velocity is 2.76 m/s, and with an assumed right atrial pressure of 3 mmHg,  the estimated right ventricular systolic pressure is Q000111Q mmHg. Left Atrium: Left atrial size was normal in size. Right Atrium: Right atrial size was normal in size. Pericardium: There is no evidence of pericardial effusion. Mitral Valve: No evidence of mitral valve regurgitation. Tricuspid Valve: Tricuspid valve regurgitation is mild. Aortic Valve: Aortic valve regurgitation is not visualized. Pulmonic Valve: Pulmonic valve regurgitation is trivial. Aorta: The aortic root and ascending aorta are structurally normal, with no evidence of dilitation. Venous: The inferior vena cava is normal in size with greater than 50% respiratory variability, suggesting right atrial pressure of 3 mmHg. IAS/Shunts: No atrial level shunt detected by color flow Doppler.  LEFT VENTRICLE PLAX 2D LVIDd:         4.80 cm   Diastology LVIDs:         1.90 cm   LV e' medial:    6.42 cm/s LV PW:         1.00 cm   LV E/e' medial:  11.8 LV IVS:        0.90 cm   LV e' lateral:   12.60 cm/s LVOT diam:     2.00 cm   LV E/e' lateral: 6.0 LV SV:         93 LV SV Index:  43 LVOT Area:     3.14 cm  RIGHT VENTRICLE             IVC RV Basal diam:  4.40 cm     IVC diam: 1.50 cm RV S prime:     11.43 cm/s TAPSE (M-mode): 1.8 cm LEFT ATRIUM             Index        RIGHT ATRIUM           Index LA diam:        5.10 cm 2.39 cm/m   RA Area:     15.00 cm LA Vol (A2C):   54.0 ml 25.31 ml/m  RA Volume:   39.10 ml  18.32 ml/m LA Vol (A4C):   70.5 ml 33.04 ml/m LA Biplane Vol: 65.2 ml 30.55 ml/m  AORTIC VALVE LVOT Vmax:   161.00 cm/s LVOT Vmean:  114.000 cm/s LVOT VTI:    0.295 m  AORTA Ao Root diam: 3.40 cm Ao Asc diam:  3.60 cm MITRAL VALVE               TRICUSPID VALVE MV Area (PHT): 3.60 cm    TR Peak grad:   30.5 mmHg MV Decel Time: 211 msec    TR Vmax:        276.00 cm/s MV E velocity: 75.50 cm/s MV A velocity: 77.60 cm/s  SHUNTS MV E/A ratio:  0.97         Systemic VTI:  0.30 m                            Systemic Diam: 2.00 cm Phineas Inches Electronically signed by Phineas Inches Signature Date/Time: 04/09/2022/10:36:57 AM    Final     Microbiology: Recent Results (from the past 240 hour(s))  Culture, blood (Routine x 2)     Status: None   Collection Time: 04/18/22  6:20 PM   Specimen: BLOOD  Result Value Ref Range Status   Specimen Description   Final    BLOOD BLOOD RIGHT WRIST Performed at [facility], [place] [address] [place], [place] [address]    Special Requests   Final    BOTTLES DRAWN AEROBIC AND ANAEROBIC Blood Culture adequate volume Performed at [facility] [address] [place], [place] [address]    Culture   Final    NO GROWTH 5 DAYS Performed at [facility], [place] [address], [place], [place] [address]    Report Status 04/23/2022 FINAL  Final  Culture, blood (Routine x 2)     Status: None   Collection Time: 04/18/22  6:49 PM   Specimen: BLOOD  Result Value Ref Range Status   Specimen Description   Final    BLOOD BLOOD LEFT FOREARM Performed at [facility] [address], [place], [place] [address]    Special Requests   Final    BOTTLES DRAWN AEROBIC AND ANAEROBIC Blood Culture adequate volume Performed at [facility] [address], [place], [place] [address]    Culture   Final    NO GROWTH 5 DAYS Performed at [facility], [place] [address] [place],  [address]    Report Status 04/23/2022 FINAL  Final     Labs: Basic Metabolic Panel: Recent Labs  Lab 04/20/22 0423 04/21/22 0432 04/22/22 0420 04/23/22 0346 04/24/22 0408  NA 136 135 135 134* 135  K 4.4 4.4 3.9 3.7 3.9  CL 104 103 103 104 105  CO2 25 24 25 24 25   GLUCOSE 125* 118* 135* 139* 246*  BUN 16 11 12 12 14   CREATININE 0.86 0.68 0.76 0.80 0.77  CALCIUM 8.1* 8.4* 8.1* 8.5* 7.9*  MG  --   --  1.4* 2.0  --    Liver Function Tests: Recent Labs  Lab  04/20/22 0423 04/21/22 0432 04/22/22 0420 04/23/22 0346 04/24/22 0408  AST 190* 154* 119* 119* 90*  ALT 103* 93* 82* 86* 68*  ALKPHOS 126 126 123 139* 120  BILITOT 0.9 1.0 0.8 0.8 0.6  PROT 6.7 7.0 6.5 7.4 6.2*  ALBUMIN 2.8* 2.9* 2.7* 3.0* 2.6*   No results for input(s): "LIPASE", "AMYLASE" in the last 168 hours. Recent Labs  Lab 04/18/22 1921 04/20/22 0423 04/21/22 0432  AMMONIA 54* 46* 40*   CBC: Recent Labs  Lab 04/18/22 1820 04/19/22 0238 04/20/22 0423 04/21/22 0854 04/22/22 0420 04/23/22 0346 04/24/22 0408  WBC 5.8   < > 2.5* 4.6 4.4 4.7 3.6*  NEUTROABS 3.4  --   --   --   --   --   --   HGB 12.0*   < > 11.3* 11.4* 11.1* 12.3* 10.3*  HCT 36.9*   < > 35.6* 34.8* 33.3* 37.1* 31.6*  MCV 106.0*   < > 109.2* 105.8* 104.1* 104.5* 105.7*  PLT 76*   < > 52* 60* 55* 63* 52*   < > = values in this interval not displayed.   Cardiac Enzymes: No results for input(s): "CKTOTAL", "CKMB", "CKMBINDEX", "TROPONINI" in the last 168 hours. BNP: BNP (last 3 results) No results for input(s): "BNP" in the last 8760 hours.  ProBNP (last 3 results) No results for input(s): "PROBNP" in the last 8760 hours.  CBG: Recent Labs  Lab 04/22/22 2105 04/23/22 0727 04/23/22 1227 04/23/22 2103 04/24/22 0748  GLUCAP 142* 120* 187* 164* 182*       Signed:  Irine Seal MD.  [facility] 04/24/2022, 11:36 AM

## 2022-04-24 NOTE — ED Notes (Signed)
ED TO INPATIENT HANDOFF REPORT  ED Nurse Name and Phone #: [phone]  S Name/Age/Gender Barry [name] 67 y.o. male Room/Bed: WHALB/WHALB  Code Status   Code Status: Prior  Home/SNF/Other Home Patient oriented to: self, place, time, and situation Is this baseline? Yes   Triage Complete: Triage complete  Chief Complaint Sleep apnea in adult [G47.30]  Triage Note PTAR reports DC from [id number] to [facility], once arrived at facility they would not accept Pt due to not having C-Pap machine with him. Friend is attempting to acquire C-Pap machine for Pt.   BP 153/81 HR 71 RR 20 Sp02 98 RA    Allergies No Known Allergies  Level of Care/Admitting Diagnosis ED Disposition     ED Disposition  Admit   Condition  --   Comment  Hospital Area: [facility] [[id number]]  Level of Care: Med-Surg [16]  May place patient in observation at [facility] or [place] if equivalent level of care is available:: No  Covid Evaluation: Asymptomatic - no recent exposure (last 10 days) testing not required  Diagnosis: Sleep apnea in adult [id number]  Admitting Physician: Eugenie Filler [place]  Attending Physician: Eugenie Filler [[id number]]          B Medical/Surgery History Past Medical History:  Diagnosis Date   Coronary artery disease    Family history of colon cancer 10/15/2019   GERD (gastroesophageal reflux disease)    Hyperlipidemia    MI (myocardial infarction) ([place]) 04/23/2007   inferior wall   Morbid obesity ([place]) 06/26/2012   S/P CABG x 3 07/04/2012   LIMA to LAD, SVG to D1, SVG to PDA, EVH via right thigh   Sleep apnea    Type II or unspecified type diabetes mellitus without mention of complication, not stated as uncontrolled    Unspecified essential hypertension    Past Surgical History:  Procedure Laterality Date   CORONARY ANGIOPLASTY WITH STENT PLACEMENT  04/23/2007   PCI and stenting of mid RCA - Dr Donnetta Hutching @ [facility]   CORONARY ARTERY BYPASS  GRAFT N/A 07/04/2012   Procedure: CORONARY ARTERY BYPASS GRAFTING (CABG);  Surgeon: Rexene Alberts, MD;  Location: [facility];  Service: Open Heart Surgery;  Laterality: N/A;  x3 using right greater saphenous vein and left internal mammary.    INTRAOPERATIVE TRANSESOPHAGEAL ECHOCARDIOGRAM N/A 07/04/2012   Procedure: INTRAOPERATIVE TRANSESOPHAGEAL ECHOCARDIOGRAM;  Surgeon: Rexene Alberts, MD;  Location: [place];  Service: Open Heart Surgery;  Laterality: N/A;   LEFT HEART CATH AND CORS/GRAFTS ANGIOGRAPHY N/A 04/18/2017   Procedure: LEFT HEART CATH AND CORS/GRAFTS ANGIOGRAPHY;  Surgeon: Lorretta Harp, MD;  Location: [place] CV LAB;  Service: Cardiovascular;  Laterality: N/A;   LEFT HEART CATHETERIZATION WITH CORONARY ANGIOGRAM N/A 06/25/2012   Procedure: LEFT HEART CATHETERIZATION WITH CORONARY ANGIOGRAM;  Surgeon: Lorretta Harp, MD;  Location: [facility] CATH LAB;  Service: Cardiovascular;  Laterality: N/A;   TOOTH EXTRACTION  04/2018   4 teeth pulled      A IV Location/Drains/Wounds Patient Lines/Drains/Airways Status     Active Line/Drains/Airways     Name Placement date Placement time Site Days   Peripheral IV 04/22/22 20 G 1.88" Anterior;Right Forearm 04/22/22  1136  Forearm  2   Incision 07/04/12 Chest Other (Comment) 07/04/12  1007  -- 3581   Incision 07/04/12 Leg Right 07/04/12  1007  -- 3581            Intake/Output Last 24 hours No intake or  output data in the 24 hours ending 04/24/22 1554  Labs/Imaging Results for orders placed or performed during the hospital encounter of 04/18/22 (from the past 48 hour(s))  Glucose, capillary     Status: Abnormal   Collection Time: 04/22/22  5:11 PM  Result Value Ref Range   Glucose-Capillary 176 (H) 70 - 99 mg/dL    Comment: Glucose reference range applies only to samples taken after fasting for at least 8 hours.  Glucose, capillary     Status: Abnormal   Collection Time: 04/22/22  9:05 PM  Result Value Ref Range   Glucose-Capillary 142  (H) 70 - 99 mg/dL    Comment: Glucose reference range applies only to samples taken after fasting for at least 8 hours.  CBC     Status: Abnormal   Collection Time: 04/23/22  3:46 AM  Result Value Ref Range   WBC 4.7 4.0 - 10.5 K/uL   RBC 3.55 (L) 4.22 - 5.81 MIL/uL   Hemoglobin 12.3 (L) 13.0 - 17.0 g/dL   HCT 37.1 (L) 39.0 - 52.0 %   MCV 104.5 (H) 80.0 - 100.0 fL   MCH 34.6 (H) 26.0 - 34.0 pg   MCHC 33.2 30.0 - 36.0 g/dL   RDW 16.9 (H) 11.5 - 15.5 %   Platelets 63 (L) 150 - 400 K/uL    Comment: Immature Platelet Fraction may be clinically indicated, consider ordering this additional test [id number] CONSISTENT WITH PREVIOUS RESULT REPEATED TO VERIFY    nRBC 0.0 0.0 - 0.2 %    Comment: Performed at [facility], [place] [address], [place], [place] [address]  Comprehensive metabolic panel     Status: Abnormal   Collection Time: 04/23/22  3:46 AM  Result Value Ref Range   Sodium 134 (L) 135 - 145 mmol/L   Potassium 3.7 3.5 - 5.1 mmol/L   Chloride 104 98 - 111 mmol/L   CO2 24 22 - 32 mmol/L   Glucose, Bld 139 (H) 70 - 99 mg/dL    Comment: Glucose reference range applies only to samples taken after fasting for at least 8 hours.   BUN 12 8 - 23 mg/dL   Creatinine, Ser 0.80 0.61 - 1.24 mg/dL   Calcium 8.5 (L) 8.9 - 10.3 mg/dL   Total Protein 7.4 6.5 - 8.1 g/dL   Albumin 3.0 (L) 3.5 - 5.0 g/dL   AST 119 (H) 15 - 41 U/L   ALT 86 (H) 0 - 44 U/L   Alkaline Phosphatase 139 (H) 38 - 126 U/L   Total Bilirubin 0.8 0.3 - 1.2 mg/dL   GFR, Estimated >60 >60 mL/min    Comment: (NOTE) Calculated using the CKD-EPI Creatinine Equation (2021)    Anion gap 6 5 - 15    Comment: Performed at [facility], [place] [address] [place], [place] [address]  Magnesium     Status: None   Collection Time: 04/23/22  3:46 AM  Result Value Ref Range   Magnesium 2.0 1.7 - 2.4 mg/dL    Comment: Performed at [facility], [place] [address], [place], [place]  [address]  Glucose, capillary     Status: Abnormal   Collection Time: 04/23/22  7:27 AM  Result Value Ref Range   Glucose-Capillary 120 (H) 70 - 99 mg/dL    Comment: Glucose reference range applies only to samples taken after fasting for at least 8 hours.  Glucose, capillary     Status: Abnormal   Collection Time:  04/23/22 12:27 PM  Result Value Ref Range   Glucose-Capillary 187 (H) 70 - 99 mg/dL    Comment: Glucose reference range applies only to samples taken after fasting for at least 8 hours.  Glucose, capillary     Status: Abnormal   Collection Time: 04/23/22  9:03 PM  Result Value Ref Range   Glucose-Capillary 164 (H) 70 - 99 mg/dL    Comment: Glucose reference range applies only to samples taken after fasting for at least 8 hours.  Comprehensive metabolic panel     Status: Abnormal   Collection Time: 04/24/22  4:08 AM  Result Value Ref Range   Sodium 135 135 - 145 mmol/L   Potassium 3.9 3.5 - 5.1 mmol/L   Chloride 105 98 - 111 mmol/L   CO2 25 22 - 32 mmol/L   Glucose, Bld 246 (H) 70 - 99 mg/dL    Comment: Glucose reference range applies only to samples taken after fasting for at least 8 hours.   BUN 14 8 - 23 mg/dL   Creatinine, Ser 0.77 0.61 - 1.24 mg/dL   Calcium 7.9 (L) 8.9 - 10.3 mg/dL   Total Protein 6.2 (L) 6.5 - 8.1 g/dL   Albumin 2.6 (L) 3.5 - 5.0 g/dL   AST 90 (H) 15 - 41 U/L   ALT 68 (H) 0 - 44 U/L   Alkaline Phosphatase 120 38 - 126 U/L   Total Bilirubin 0.6 0.3 - 1.2 mg/dL   GFR, Estimated >60 >60 mL/min    Comment: (NOTE) Calculated using the CKD-EPI Creatinine Equation (2021)    Anion gap 5 5 - 15    Comment: Performed at [facility], [place] [address] [place], [place] [address]  CBC     Status: Abnormal   Collection Time: 04/24/22  4:08 AM  Result Value Ref Range   WBC 3.6 (L) 4.0 - 10.5 K/uL   RBC 2.99 (L) 4.22 - 5.81 MIL/uL   Hemoglobin 10.3 (L) 13.0 - 17.0 g/dL   HCT 31.6 (L) 39.0 - 52.0 %   MCV 105.7 (H) 80.0 - 100.0 fL   MCH  34.4 (H) 26.0 - 34.0 pg   MCHC 32.6 30.0 - 36.0 g/dL   RDW 17.0 (H) 11.5 - 15.5 %   Platelets 52 (L) 150 - 400 K/uL    Comment: Immature Platelet Fraction may be clinically indicated, consider ordering this additional test [id number] CONSISTENT WITH PREVIOUS RESULT REPEATED TO VERIFY    nRBC 0.0 0.0 - 0.2 %    Comment: Performed at [facility], [place] [address] [place], [place] [address]  Glucose, capillary     Status: Abnormal   Collection Time: 04/24/22  7:48 AM  Result Value Ref Range   Glucose-Capillary 182 (H) 70 - 99 mg/dL    Comment: Glucose reference range applies only to samples taken after fasting for at least 8 hours.  Glucose, capillary     Status: Abnormal   Collection Time: 04/24/22 11:45 AM  Result Value Ref Range   Glucose-Capillary 107 (H) 70 - 99 mg/dL    Comment: Glucose reference range applies only to samples taken after fasting for at least 8 hours.   No results found.  Pending Labs Unresulted Labs (From admission, onward)    None       Vitals/Pain Today's Vitals   04/24/22 1506 04/24/22 1507  BP: 119/88   Pulse: 64   Resp: 18   Temp: 98.5 F (36.9 C)   TempSrc:  Oral   SpO2: 97%   PainSc:  0-No pain    Isolation Precautions No active isolations  Medications Medications - No data to display  Mobility walks     Focused Assessments Needs C-Pap for facility   R Recommendations: See Admitting Provider Note  Report given to:   Additional Notes: .

## 2022-04-24 NOTE — H&P (Signed)
History and Physical    Barry [name] [id number] DOB: August 20, 1955 DOA: 04/24/2022  PCP: Janith Lima, MD  Patient coming from: SNF  I have personally briefly reviewed patient's old medical records in [place]  Chief Complaint: Discharged this morning to SNF, sent back from SNF due to not having a CPAP machine  HPI: Barry [name] is a 67 y.o. male with medical history significant of CAD status post CABG x 3, hypertension, hyperlipidemia, cirrhosis, insulin-dependent type 2 diabetes, obstructive sleep apnea, obesity presented to the ED as he was sent back from skilled nursing facility as he did not have his CPAP machine. Patient was discharged by me this morning around 1130 to skilled nursing facility.  Once patient got to the skilled nursing facility due to not having his own CPAP machine and facility unable to provide CPAP over this holiday weekend patient was sent back from facility to the ED due to lack of equipment. Patient denies any fevers, no chills, no nausea, no vomiting, no chest pain, no shortness of breath, no abdominal pain, no melena, no hematemesis, no hematochezia, no diarrhea, no constipation, no lightheadedness, no syncopal episodes, no other associated symptoms.  ED Course: Patient seen in the ED, vitals noted to be stable.  Social work contacted however unable to place patient and as such hospitalist called to admit the patient.  Review of Systems: As per HPI otherwise all other systems reviewed and are negative.  Past Medical History:  Diagnosis Date   Coronary artery disease    Family history of colon cancer 10/15/2019   GERD (gastroesophageal reflux disease)    Hyperlipidemia    MI (myocardial infarction) ([place]) 04/23/2007   inferior wall   Morbid obesity ([place]) 06/26/2012   S/P CABG x 3 07/04/2012   LIMA to LAD, SVG to D1, SVG to PDA, EVH via right thigh   Sleep apnea    Type II or unspecified type diabetes mellitus without mention of complication, not stated as  uncontrolled    Unspecified essential hypertension     Past Surgical History:  Procedure Laterality Date   CORONARY ANGIOPLASTY WITH STENT PLACEMENT  04/23/2007   PCI and stenting of mid RCA - Dr Donnetta Hutching @ [facility]   CORONARY ARTERY BYPASS GRAFT N/A 07/04/2012   Procedure: CORONARY ARTERY BYPASS GRAFTING (CABG);  Surgeon: Rexene Alberts, MD;  Location: [facility];  Service: Open Heart Surgery;  Laterality: N/A;  x3 using right greater saphenous vein and left internal mammary.    INTRAOPERATIVE TRANSESOPHAGEAL ECHOCARDIOGRAM N/A 07/04/2012   Procedure: INTRAOPERATIVE TRANSESOPHAGEAL ECHOCARDIOGRAM;  Surgeon: Rexene Alberts, MD;  Location: [facility];  Service: Open Heart Surgery;  Laterality: N/A;   LEFT HEART CATH AND CORS/GRAFTS ANGIOGRAPHY N/A 04/18/2017   Procedure: LEFT HEART CATH AND CORS/GRAFTS ANGIOGRAPHY;  Surgeon: Lorretta Harp, MD;  Location: [place] CV LAB;  Service: Cardiovascular;  Laterality: N/A;   LEFT HEART CATHETERIZATION WITH CORONARY ANGIOGRAM N/A 06/25/2012   Procedure: LEFT HEART CATHETERIZATION WITH CORONARY ANGIOGRAM;  Surgeon: Lorretta Harp, MD;  Location: [facility] CATH LAB;  Service: Cardiovascular;  Laterality: N/A;   TOOTH EXTRACTION  04/2018   4 teeth pulled     Social History  reports that he has never smoked. He has never used smokeless tobacco. He reports that he does not drink alcohol and does not use drugs.  No Known Allergies  Family History  Problem Relation Age of Onset   Cancer Mother  unknown type; dx late 22s   Cancer Father        unknown type; dx > 85yo   Colon cancer Brother        dx late 68s   Colon cancer Brother        dx late 37s; dx 47   Cancer Sister        unknown type; dx late 32s   Diabetes Sister    Diabetes Brother    Cancer Maternal Aunt        unknown type; dx > 40yo    Prior to Admission medications   Medication Sig Start Date End Date Taking? Authorizing Provider  acetaminophen (TYLENOL) 500 MG tablet Take 1 tablet (500  mg total) by mouth every 8 (eight) hours as needed for moderate pain. 04/24/22   Eugenie Filler, MD  albuterol (PROVENTIL HFA;VENTOLIN HFA) [phone] Base) MCG/ACT inhaler Inhale 2 puffs into the lungs every 6 (six) hours as needed for wheezing or shortness of breath. 04/21/17   Gildardo Cranker, DO  aspirin 81 MG chewable tablet Chew 81 mg by mouth daily.     [provider]  atorvastatin (LIPITOR) 80 MG tablet Take 1 tablet (80 mg total) by mouth daily. 05/08/22   Eugenie Filler, MD  Blood Glucose Monitoring Suppl (CONTOUR NEXT EZ MONITOR) w/Device KIT Test blood sugar three times daily E11.22 03/08/16   Tivis Ringer, RPH-CPP  busPIRone (BUSPAR) 15 MG tablet TAKE 1 TABLET BY MOUTH THREE TIMES DAILY FOR ANXIETY 07/19/17   Gildardo Cranker, DO  ciclopirox (LOPROX) 0.77 % cream Apply topically 2 (two) times daily. 03/01/22   Janith Lima, MD  clopidogrel (PLAVIX) 75 MG tablet APPOINTMENT OVERDUE Take 1 by mouth daily Patient taking differently: Take 75 mg by mouth daily. 10/31/19   [name], NP  cyclobenzaprine (FLEXERIL) 10 MG tablet Take 10 mg by mouth 2 (two) times daily as needed. 07/23/19   [provider]  FEROSUL 325 (65 Fe) MG tablet Take 325 mg by mouth daily. 10/13/21   [provider]  fluticasone (CUTIVATE) 0.05 % cream Apply 1 Application topically 2 (two) times daily as needed (for itching- face).    [provider]  gabapentin (NEURONTIN) 300 MG capsule Take 300 mg by mouth in the morning and at bedtime. 09/12/19   [provider]  lactulose (CHRONULAC) 10 GM/15ML solution Take 15 mLs (10 g total) by mouth 3 (three) times daily. 04/24/22   Eugenie Filler, MD  metFORMIN (GLUCOPHAGE) 1000 MG tablet TAKE 1 TABLET(1000 MG) BY MOUTH TWICE DAILY Patient taking differently: Take 1,000 mg by mouth 2 (two) times daily with a meal. 09/09/17   Gildardo Cranker, DO  metoprolol tartrate (LOPRESSOR) 25 MG tablet Take 0.5 tablets (12.5 mg total) by  mouth 2 (two) times daily. 05/18/17   Gildardo Cranker, DO  oxyCODONE-acetaminophen (PERCOCET) 10-325 MG tablet Take 1 tablet by mouth every 8 (eight) hours as needed for pain. 04/24/22   Eugenie Filler, MD  pantoprazole (PROTONIX) 40 MG tablet TAKE 1 TABLET BY MOUTH EVERY DAY 04/12/22   Volanda Napoleon, MD  sertraline (ZOLOFT) 100 MG tablet TAKE 2 TABLETS(200 MG) BY MOUTH DAILY Patient taking differently: Take 100 mg by mouth daily. 04/20/19   [name], NP  SYMBICORT 80-4.5 MCG/ACT inhaler Inhale 2 puffs into the lungs in the morning and at bedtime. 11/16/21   [provider]  thiamine (VITAMIN B-1) 100 MG tablet Take 1 tablet (100 mg  total) by mouth daily. 03/06/22   Janith Lima, MD    Physical Exam: Vitals:   04/24/22 1506  BP: 119/88  Pulse: 64  Resp: 18  Temp: 98.5 F (36.9 C)  TempSrc: Oral  SpO2: 97%    Constitutional: NAD, calm, comfortable Vitals:   04/24/22 1506  BP: 119/88  Pulse: 64  Resp: 18  Temp: 98.5 F (36.9 C)  TempSrc: Oral  SpO2: 97%   Eyes: Small left pupil compared to right.  Right pupil nonreactive to light as noted on CT to be a prosthesis.  ENMT: Mucous membranes are moist. Posterior pharynx clear of any exudate or lesions.Normal dentition.  Neck: normal, supple, no masses, no thyromegaly Respiratory: clear to auscultation bilaterally, no wheezing, no crackles. Normal respiratory effort. No accessory muscle use.  Cardiovascular: Regular rate and rhythm, no murmurs / rubs / gallops. No extremity edema. 2+ pedal pulses. No carotid bruits.  Abdomen: no tenderness, no masses palpated. No hepatosplenomegaly. Bowel sounds positive.  Musculoskeletal: no clubbing / cyanosis. No joint deformity upper and lower extremities. Good ROM, no contractures. Normal muscle tone.  Skin: Rash noted on forehead, papular rash on anterior mid abdomen. Neurologic: CN 2-12 grossly intact. Sensation intact, DTR unable to assess symmetrically and by  laterally.  Strength 5/5 in all 4.  Psychiatric: Normal judgment and insight. Alert and oriented x 3. Normal mood.   Labs on Admission: I have personally reviewed following labs and imaging studies  CBC: Recent Labs  Lab 04/18/22 1820 04/19/22 0238 04/20/22 0423 04/21/22 0854 04/22/22 0420 04/23/22 0346 04/24/22 0408  WBC 5.8   < > 2.5* 4.6 4.4 4.7 3.6*  NEUTROABS 3.4  --   --   --   --   --   --   HGB 12.0*   < > 11.3* 11.4* 11.1* 12.3* 10.3*  HCT 36.9*   < > 35.6* 34.8* 33.3* 37.1* 31.6*  MCV 106.0*   < > 109.2* 105.8* 104.1* 104.5* 105.7*  PLT 76*   < > 52* 60* 55* 63* 52*   < > = values in this interval not displayed.    Basic Metabolic Panel: Recent Labs  Lab 04/20/22 0423 04/21/22 0432 04/22/22 0420 04/23/22 0346 04/24/22 0408  NA 136 135 135 134* 135  K 4.4 4.4 3.9 3.7 3.9  CL 104 103 103 104 105  CO2 25 24 25 24 25   GLUCOSE 125* 118* 135* 139* 246*  BUN 16 11 12 12 14   CREATININE 0.86 0.68 0.76 0.80 0.77  CALCIUM 8.1* 8.4* 8.1* 8.5* 7.9*  MG  --   --  1.4* 2.0  --     GFR: Estimated Creatinine Clearance: 107.1 mL/min (by C-G formula based on SCr of 0.77 mg/dL).  Liver Function Tests: Recent Labs  Lab 04/20/22 0423 04/21/22 0432 04/22/22 0420 04/23/22 0346 04/24/22 0408  AST 190* 154* 119* 119* 90*  ALT 103* 93* 82* 86* 68*  ALKPHOS 126 126 123 139* 120  BILITOT 0.9 1.0 0.8 0.8 0.6  PROT 6.7 7.0 6.5 7.4 6.2*  ALBUMIN 2.8* 2.9* 2.7* 3.0* 2.6*    Urine analysis:    Component Value Date/Time   COLORURINE AMBER (A) 04/18/2022 1840   APPEARANCEUR CLEAR 04/18/2022 1840   APPEARANCEUR Clear 07/16/2014 0804   LABSPEC 1.027 04/18/2022 1840   PHURINE 5.0 04/18/2022 1840   GLUCOSEU NEGATIVE 04/18/2022 1840   GLUCOSEU NEGATIVE 03/01/2022 1400   HGBUR NEGATIVE 04/18/2022 1840   BILIRUBINUR SMALL (A) 04/18/2022 1840   BILIRUBINUR  Negative 07/16/2014 0804   KETONESUR 5 (A) 04/18/2022 1840   PROTEINUR 30 (A) 04/18/2022 1840   UROBILINOGEN 2.0 (A)  03/01/2022 1400   NITRITE NEGATIVE 04/18/2022 1840   LEUKOCYTESUR NEGATIVE 04/18/2022 1840    Radiological Exams on Admission: No results found.  EKG: Not done.  Assessment/Plan Principal Problem:   Obstructive sleep apnea Active Problems:   Morbid obesity due to excess calories (HCC)   CAD (coronary artery disease)   Depression with anxiety   Essential hypertension, benign   Hyperlipidemia LDL goal <70   Type 2 diabetes mellitus with diabetic neuropathy, with long-term current use of insulin (HCC)   Thrombocytopenia (HCC)   GERD (gastroesophageal reflux disease)   Cirrhosis of liver without ascites (HCC)   Right hip pain   OSA (obstructive sleep apnea)   Sleep apnea in adult   #1 obstructive sleep apnea -Patient discharged this morning to skilled nursing facility, however as patient did not have his own CPAP machine and facility unable to provide equipment this holiday weekend and as such patient sent back to the ED. -Placed on CPAP nightly.  2.  History of cirrhosis/EGD in the past showed portal gastropathy and grade 1 varices.- -LFTs noted to be elevated during recent hospitalization and trending down. --Acute hepatitis panel with reactive hep B surface antigen, HCV antibody nonreactive, hepatitis A antibody IgM nonreactive, hep B core antibody IgM equivocal. -HIV nonreactive. -Patient started on lactulose during this last hospitalization which we will resume at 10 g 3 times daily for goal bowel movements of approximately 3-5 bowel movements per day. -Outpatient follow-up.  3.  Diabetes mellitus type 2 -Hemoglobin A1c 6.7 (03/01/2022). -Hold oral hypoglycemic agents. -SSI.  4.  Hypertension -Resume home regimen beta-blocker monitor blood pressure.  5.  Hyperlipidemia -Continue to hold statin and resume on discharge.  6.  CAD/history of CABG -Patient noted to have been on aspirin, Lipitor, lisinopril, Lopressor in the outpatient setting. -Resume home regimen  aspirin and Plavix. -Hold statin and resume postdischarge. -Resume home regimen Lopressor. -Was on lisinopril which has subsequently been discontinued.  7.  Chronic thrombocytopenia secondary to cirrhosis -Patient with no overt bleeding. -Platelet count from labs drawn this morning prior to discharge 52 K. -Follow.  8.  Right hip pain/DJD -Patient noted to be scheduled for elective surgery for total hip arthroplasty per orthopedics, Dr. Lyla Glassing on 05/19/2022. -Patient was seen by cardiology in outpatient setting and cleared for surgery. -Resume home regimen oral pain medications. -PT/OT.  9.  Sarcoidosis -Recently diagnosed after biopsy of the skin from the right back area approximately 3 weeks ago. -Outpatient follow-up.  10.  Acute metabolic encephalopathy-resolved -Patient admitted during last hospitalization (3/24-3/30) with acute encephalopathy. -Patient noted to have elevated ammonia levels during that time and started on lactulose, patient received high-dose vitamin B1 and transition to oral vitamin B1 100 mg daily.  Status post full course of antibiotics. -No further workup needed. -Patient back at baseline.  11.  GERD -PPI.  DVT prophylaxis: SCDs Code Status:  Full Family Communication:  Updated patient.  No family at bedside. Disposition Plan:   Patient is from:  Just discharged from the hospital to SNF and sent back from SNF within a matter of hours.  Anticipated DC to:  SNF  Anticipated DC date:  04/26/2022  Anticipated DC barriers: SNF placement Consults called:  None Admission status:  Place in observation/MedSurg  Severity of Illness: The appropriate patient status for this patient is OBSERVATION. Observation status is judged to be  reasonable and necessary in order to provide the required intensity of service to ensure the patient's safety. The patient's presenting symptoms, physical exam findings, and initial radiographic and laboratory data in the context of  their medical condition is felt to place them at decreased risk for further clinical deterioration. Furthermore, it is anticipated that the patient will be medically stable for discharge from the hospital within 2 midnights of admission.     Irine Seal MD [facility]  How to contact the [facility] Attending or Consulting provider [place] or covering provider during after hours [place], for this patient?   Check the care team in [facility] and look for a) attending/consulting TRH provider listed and b) the [facility] team listed Log into [email or web address] and use [place]'s universal password to access. If you do not have the password, please contact the hospital operator. Locate the [facility] provider you are looking for under [facility] and page to a number that you can be directly reached. If you still have difficulty reaching the provider, please page the [facility] (Director on Call) for the Hospitalists listed on amion for assistance.  04/24/2022, 4:18 PM

## 2022-04-24 NOTE — TOC Progression Note (Addendum)
Transition of Care [facility]) - Progression Note    Patient Details  Name: Barry [name] MRN: [id number] Date of Birth: 12/04/1955  Transition of Care [facility]) CM/SW Contact  Rodney Booze, LCSW Phone Number: 04/24/2022, 2:04 PM  Clinical Narrative:     Addend: 2:15 Patient friend LIN will bring the CPAP machine before the patient goes to sleep.   Patient is being sent back, Kia from [facility] reports that she wasn't aware on 3/29 that he needed a CAP. The patient is being brought back to [facility] due to not having one.   Expected Discharge Plan: Skilled Nursing Facility Barriers to Discharge: No Barriers Identified  Expected Discharge Plan and Services       Living arrangements for the past 2 months: Apartment Expected Discharge Date: 04/24/22                                     Social Determinants of Health (SDOH) Interventions SDOH Screenings   Food Insecurity: No Food Insecurity (04/19/2022)  Housing: Low Risk  (04/19/2022)  Transportation Needs: No Transportation Needs (04/19/2022)  Utilities: Not At Risk (04/19/2022)  Depression (PHQ2-9): Low Risk  (05/16/2019)  Tobacco Use: Low Risk  (04/18/2022)    Readmission Risk Interventions     No data to display

## 2022-04-24 NOTE — TOC Transition Note (Signed)
Transition of Care [facility]) - CM/SW Discharge Note   Patient Details  Name: Barry [name] MRN: [id number] Date of Birth: 1955-08-24  Transition of Care [facility]) CM/SW Contact:  Rodney Booze, LCSW Phone Number: 04/24/2022, 12:05 PM   Clinical Narrative:    Patient will DC to [facility] at this time CSW will call PTAR.    Final next level of care: Skilled Nursing Facility Barriers to Discharge: No Barriers Identified   Patient Goals and CMS Choice CMS Medicare.gov Compare Post Acute Care list provided to:: Patient Choice offered to / list presented to : Patient  Discharge Placement                Patient chooses bed at: [facility] Patient to be transferred to facility by: [place] Name of family member notified: Patient Patient and family notified of of transfer: 04/24/22  Discharge Plan and Services Additional resources added to the After Visit Summary for                                       Social Determinants of Health (SDOH) Interventions SDOH Screenings   Food Insecurity: No Food Insecurity (04/19/2022)  Housing: Low Risk  (04/19/2022)  Transportation Needs: No Transportation Needs (04/19/2022)  Utilities: Not At Risk (04/19/2022)  Depression (PHQ2-9): Low Risk  (05/16/2019)  Tobacco Use: Low Risk  (04/18/2022)     Readmission Risk Interventions     No data to display

## 2022-04-24 NOTE — ED Triage Notes (Signed)
PTAR reports DC from [id number] to [facility], once arrived at facility they would not accept Pt due to not having C-Pap machine with him. Friend is attempting to acquire C-Pap machine for Pt.   BP 153/81 HR 71 RR 20 Sp02 98 RA

## 2022-04-24 NOTE — TOC Progression Note (Signed)
Transition of Care [facility] - Progression Note    Patient Details  Name: Barry [name] MRN: [id number] Date of Birth: 28-Mar-1955  Transition of Care [facility]) CM/SW Contact  Rodney Booze, LCSW Phone Number: 04/24/2022, 10:54 AM  Clinical Narrative:       Expected Discharge Plan: Skilled Nursing Facility Barriers to Discharge: Continued Medical Work up  Expected Discharge Plan and Services  CSW spoke with Kia at [facility] states is the patient is not ready to be transported to facility at least by two no later than three patient would need to be DC Monday. At this time provider has not stated if the patient is ready for DC. TOC will continue to follow.      Living arrangements for the past 2 months: Apartment                                       Social Determinants of Health (SDOH) Interventions SDOH Screenings   Food Insecurity: No Food Insecurity (04/19/2022)  Housing: Low Risk  (04/19/2022)  Transportation Needs: No Transportation Needs (04/19/2022)  Utilities: Not At Risk (04/19/2022)  Depression (PHQ2-9): Low Risk  (05/16/2019)  Tobacco Use: Low Risk  (04/18/2022)    Readmission Risk Interventions     No data to display

## 2022-04-24 NOTE — Progress Notes (Signed)
Mobility Specialist - Progress Note   04/24/22 1141  Mobility  Activity Ambulated with assistance in room;Stood at bedside  Level of Assistance Contact guard assist, steadying assist  Assistive Device Cane  Distance Ambulated (ft) 15 ft  Activity Response Tolerated well  Mobility Referral Yes  $Mobility charge 1 Mobility   Pt received in bed and agreeable to transfer & do sit-to-stands. Pt tolerated 5x sit-to-stands. Prior to transferring to recliner pt requested to ambulate to door & back to recliner. Pt to recliner w/ all needs met & call bell in reach.    [facility]

## 2022-04-25 DIAGNOSIS — I25118 Atherosclerotic heart disease of native coronary artery with other forms of angina pectoris: Secondary | ICD-10-CM | POA: Diagnosis not present

## 2022-04-25 DIAGNOSIS — G4733 Obstructive sleep apnea (adult) (pediatric): Secondary | ICD-10-CM | POA: Diagnosis not present

## 2022-04-25 DIAGNOSIS — I10 Essential (primary) hypertension: Secondary | ICD-10-CM | POA: Diagnosis not present

## 2022-04-25 LAB — GLUCOSE, CAPILLARY
Glucose-Capillary: 127 mg/dL — ABNORMAL HIGH (ref 70–99)
Glucose-Capillary: 127 mg/dL — ABNORMAL HIGH (ref 70–99)
Glucose-Capillary: 136 mg/dL — ABNORMAL HIGH (ref 70–99)
Glucose-Capillary: 158 mg/dL — ABNORMAL HIGH (ref 70–99)

## 2022-04-25 LAB — BASIC METABOLIC PANEL
Anion gap: 8 (ref 5–15)
BUN: 16 mg/dL (ref 8–23)
CO2: 24 mmol/L (ref 22–32)
Calcium: 8.6 mg/dL — ABNORMAL LOW (ref 8.9–10.3)
Chloride: 107 mmol/L (ref 98–111)
Creatinine, Ser: 0.78 mg/dL (ref 0.61–1.24)
GFR, Estimated: >60 mL/min (ref >60)
Glucose, Bld: 143 mg/dL — ABNORMAL HIGH (ref 70–99)
Potassium: 4 mmol/L (ref 3.5–5.1)
Sodium: 139 mmol/L (ref 135–145)

## 2022-04-25 LAB — CBC
HCT: 37.3 % — ABNORMAL LOW (ref 39.0–52.0)
Hemoglobin: 11.7 g/dL — ABNORMAL LOW (ref 13.0–17.0)
MCH: 33.7 pg (ref 26.0–34.0)
MCHC: 31.4 g/dL (ref 30.0–36.0)
MCV: 107.5 fL — ABNORMAL HIGH (ref 80.0–100.0)
Platelets: 60 10*3/uL — ABNORMAL LOW (ref 150–400)
RBC: 3.47 MIL/uL — ABNORMAL LOW (ref 4.22–5.81)
RDW: 17 % — ABNORMAL HIGH (ref 11.5–15.5)
WBC: 4.5 10*3/uL (ref 4.0–10.5)
nRBC: 0 % (ref 0.0–0.2)

## 2022-04-25 NOTE — Evaluation (Signed)
Occupational Therapy Evaluation Patient Details Name: Barry [name] MRN: [id number] DOB: 1955/11/11 Today's Date: 04/25/2022   History of Present Illness Pt is 67 y.o. male who was admitted on 04/18/22 with acute metabolic encephalopathy with AKI. UDS with positive for benzos and opiods. paitent was discharged to SNF on 3/30 with return to hospital when facility did not have CPAP available. Pt medical history significant of CAD s/p CAGB x3, HTN, HLD, cirrhosis, insulin dependent T2DM, OSA, obesity, R eye prosthesis   Clinical Impression   Patient is a 67 year old male who was admitted back to hospital from SNF. Patient was previously living at friends condo with flight of stairs to reach his living area. Patient reported that there was no ability to stay on lower level in condo. Patient was min guard for functional mobility in room with max A for LB dress/bathing tasks with increased pain in R hip.  Patient was educated on keeping four prong cane on ground v/s/ using only one prong to advance forwards. Carryover was minimal with education on this date. Patient would continue to benefit from skilled OT services at this time while admitted and after d/c to address noted deficits in order to improve overall safety and independence in ADLs.       Recommendations for follow up therapy are one component of a multi-disciplinary discharge planning process, led by the attending physician.  Recommendations may be updated based on patient status, additional functional criteria and insurance authorization.   Assistance Recommended at Discharge Frequent or constant Supervision/Assistance  Patient can return home with the following A little help with walking and/or transfers;A little help with bathing/dressing/bathroom;Help with stairs or ramp for entrance;Assist for transportation;Assistance with cooking/housework;Direct supervision/assist for financial management;Direct supervision/assist for medications  management    Functional Status Assessment  Patient has had a recent decline in their functional status and demonstrates the ability to make significant improvements in function in a reasonable and predictable amount of time.  Equipment Recommendations  Tub/shower seat       Precautions / Restrictions Precautions Precautions: Fall Restrictions Weight Bearing Restrictions: No      Mobility Bed Mobility               General bed mobility comments: patient was up on bench in room and returned to recliner at end of session            ADL either performed or assessed with clinical judgement   ADL Overall ADL's : Needs assistance/impaired Eating/Feeding: Modified independent;Sitting   Grooming: Modified independent;Sitting Grooming Details (indicate cue type and reason): patient showed therapist how he had moved BSC to sink in bathroom this AM to be able to sit and engage in ADL tasks. Upper Body Bathing: Set up;Sitting   Lower Body Bathing: Moderate assistance;Sitting/lateral leans   Upper Body Dressing : Set up;Sitting   Lower Body Dressing: Sit to/from stand;Sitting/lateral leans;Moderate assistance Lower Body Dressing Details (indicate cue type and reason): uanble to reach feet/bleow knees without A Toilet Transfer: Min Psychiatric nurse Details (indicate cue type and reason): with personal cane with increased time and cues to keep all prongs on the ground Toileting- Water quality scientist and Hygiene: Min guard;Sit to/from stand               Vision Baseline Vision/History: 1 Wears glasses Patient Visual Report: No change from baseline              Pertinent Vitals/Pain Pain Assessment Pain Assessment: Faces Faces Pain Scale:  Hurts little more Pain Location: R hip and bil knee Pain Descriptors / Indicators: Discomfort Pain Intervention(s): Limited activity within patient's tolerance, Monitored during session, Repositioned     Hand Dominance  Right   Extremity/Trunk Assessment Upper Extremity Assessment Upper Extremity Assessment: Overall WFL for tasks assessed   Lower Extremity Assessment Lower Extremity Assessment: Generalized weakness RLE Deficits / Details: awaiting right hip replacement scheduled for April, audible crepitus   Cervical / Trunk Assessment Cervical / Trunk Assessment: Normal   Communication Communication Communication: No difficulties   Cognition Arousal/Alertness: Awake/alert Behavior During Therapy: WFL for tasks assessed/performed Overall Cognitive Status: Within Functional Limits for tasks assessed                      Home Living Family/patient expects to be discharged to:: Skilled nursing facility (was d/c to SNF last admission) Living Arrangements: Non-relatives/Friends         Home Equipment: Rollator (4 wheels);Cane - quad   Additional Comments: "I don't have anywhere to go". Pt moved her 3 months ago from [facility] and was staying with a friend who as a condo with a flight of stairs, pt was unable to navigate stairs there. patient reported he is "unable to sleep downstairs" in condo      Prior Functioning/Environment Prior Level of Function : Needs assist             Mobility Comments: pt reports he was using rollator prior to admission but with great difficulty and wasn't able to walk far. has personal cane in room that patient is using to get around in room ADLs Comments: reported he was completing ADLs and IADLs for himself.        OT Problem List: Decreased activity tolerance;Impaired balance (sitting and/or standing);Decreased safety awareness;Decreased knowledge of use of DME or AE;Decreased knowledge of precautions;Pain      OT Treatment/Interventions: Self-care/ADL training;Neuromuscular education;Therapeutic exercise;Energy conservation;DME and/or AE instruction;Patient/family education;Balance training    OT Goals(Current goals can be found in the care plan section)  Acute Rehab OT Goals Patient Stated Goal: to get surgery next month OT Goal Formulation: With patient Time For Goal Achievement: 05/09/22 Potential to Achieve Goals: Fair  OT Frequency: Min 2X/week       AM-PAC OT "6 Clicks" Daily Activity     Outcome Measure Help from another person eating meals?: None Help from another person taking care of personal grooming?: None Help from another person toileting, which includes using toliet, bedpan, or urinal?: A Little Help from another person bathing (including washing, rinsing, drying)?: A Little Help from another person to put on and taking off regular upper body clothing?: A Little Help from another person to put on and taking off regular lower body clothing?: A Lot 6 Click Score: 19   End of Session Equipment Utilized During Treatment: Other (comment) (personal cane) Nurse Communication: Mobility status  Activity Tolerance: Patient tolerated treatment well;Patient limited by pain Patient left: in chair;with call bell/phone within reach;Other (comment) (MD in room)  OT Visit Diagnosis: Unsteadiness on feet (R26.81);Muscle weakness (generalized) (M62.81);Pain Pain - Right/Left: Right Pain - part of body: Hip                Time: [id number] OT Time Calculation (min): 20 min Charges:  OT General Charges $OT Visit: 1 Visit OT Evaluation $OT Eval Low Complexity: 1 Low  Daniele Dillow OTR/L, MS Acute Rehabilitation Department Office# [phone]   Willa Rough 04/25/2022, 1:57 PM

## 2022-04-25 NOTE — Evaluation (Signed)
Physical Therapy Evaluation Patient Details Name: Barry [name] MRN: [id number] DOB: 1955-04-02 Today's Date: 04/25/2022  History of Present Illness  67 y.o. male with medical history significant of CAD status post CABG x 3, hypertension, hyperlipidemia, cirrhosis, insulin-dependent type 2 diabetes, obstructive sleep apnea, obesity, R eye prosthesis presented to the ED as he was sent back from skilled nursing facility as he did not have his CPAP machine.  Pt with recent admission 04/18/22 for acute metabolic encephalopathy with AKI and discharged to [facility] 3/30 and returned to hospital when facility declined to take him without a CPAP machine.  Clinical Impression  Pt admitted with above diagnosis.  Pt currently with functional limitations due to the deficits listed below (see PT Problem List). Pt will benefit from acute skilled PT to increase their independence and safety with mobility to allow discharge.  Pt assisted with ambulating short distance in hallway and then returned to recliner as his lunch arrived.  Pt reports right hip pain limiting his mobility as he is awaiting right hip replacement in April.  Pt reports difficulty with stair negotiation and has no assist available at home.          Recommendations for follow up therapy are one component of a multi-disciplinary discharge planning process, led by the attending physician.  Recommendations may be updated based on patient status, additional functional criteria and insurance authorization.  Follow Up Recommendations Can patient physically be transported by private vehicle: Yes     Assistance Recommended at Discharge Frequent or constant Supervision/Assistance  Patient can return home with the following  Assist for transportation;Help with stairs or ramp for entrance;Assistance with cooking/housework;A little help with bathing/dressing/bathroom;A little help with walking and/or transfers    Equipment Recommendations None recommended by PT   Recommendations for Other Services       Functional Status Assessment Patient has had a recent decline in their functional status and demonstrates the ability to make significant improvements in function in a reasonable and predictable amount of time.     Precautions / Restrictions Precautions Precautions: Fall Restrictions Weight Bearing Restrictions: No      Mobility  Bed Mobility               General bed mobility comments: pt in recliner    Transfers Overall transfer level: Needs assistance Equipment used: Quad cane Transfers: Sit to/from Stand Sit to Stand: Min guard           General transfer comment: utilizing armrests to self assist    Ambulation/Gait Ambulation/Gait assistance: Min guard Gait Distance (Feet): 60 Feet Assistive device: Quad cane Gait Pattern/deviations: Decreased stance time - right, Antalgic, Step-to pattern Gait velocity: decreased     General Gait Details: gait pattern deviations due to right hip, pt anticipates surgery next month, audible crepitus with movement, distance to pain tolerance; pt also occasionally holding rail (declined therapist going find RW for pt to use)  Stairs            Wheelchair Mobility    Modified Rankin (Stroke Patients Only)       Balance Overall balance assessment: Needs assistance         Standing balance support: Single extremity supported Standing balance-Leahy Scale: Poor Standing balance comment: utilizing assistive device at baseline due to right hip pain                             Pertinent Vitals/Pain Pain Assessment Pain  Assessment: Faces Faces Pain Scale: Hurts little more Pain Location: R hip and bil knee Pain Descriptors / Indicators: Discomfort Pain Intervention(s): Repositioned, Monitored during session    Home Living Family/patient expects to be discharged to:: Skilled nursing facility (was d/c to SNF last admission) Living Arrangements:  Non-relatives/Friends               Home Equipment: Rollator (4 wheels);Cane - quad Additional Comments: "I don't have anywhere to go". Pt moved her 3 months ago from [facility] and was staying with a friend who as a condo with a flight of stairs, pt was unable to navigate stairs there. patient reported he is "unable to sleep downstairs" in condo    Prior Function Prior Level of Function : Needs assist             Mobility Comments: pt reports he was using rollator prior to admission but with great difficulty and wasn't able to walk far. has personal cane in room that patient is using to get around in room ADLs Comments: reported he was completing ADLs and IADLs for himself.     Hand Dominance   Dominant Hand: Right    Extremity/Trunk Assessment   Upper Extremity Assessment Upper Extremity Assessment: Overall WFL for tasks assessed    Lower Extremity Assessment Lower Extremity Assessment: Generalized weakness RLE Deficits / Details: awaiting right hip replacement scheduled for April, audible crepitus    Cervical / Trunk Assessment Cervical / Trunk Assessment: Normal  Communication   Communication: No difficulties  Cognition Arousal/Alertness: Awake/alert Behavior During Therapy: WFL for tasks assessed/performed Overall Cognitive Status: Within Functional Limits for tasks assessed                                          General Comments      Exercises     Assessment/Plan    PT Assessment Patient needs continued PT services  PT Problem List Decreased mobility;Decreased activity tolerance;Decreased balance;Decreased strength       PT Treatment Interventions Gait training;Therapeutic activities;Functional mobility training;Patient/family education;DME instruction;Therapeutic exercise;Stair training    PT Goals (Current goals can be found in the Care Plan section)  Acute Rehab PT Goals PT Goal Formulation: With patient Time For Goal Achievement:  05/09/22 Potential to Achieve Goals: Good    Frequency Min 2X/week     Co-evaluation               AM-PAC PT "6 Clicks" Mobility  Outcome Measure Help needed turning from your back to your side while in a flat bed without using bedrails?: A Little Help needed moving from lying on your back to sitting on the side of a flat bed without using bedrails?: A Little Help needed moving to and from a bed to a chair (including a wheelchair)?: A Little Help needed standing up from a chair using your arms (e.g., wheelchair or bedside chair)?: A Little Help needed to walk in hospital room?: A Little Help needed climbing 3-5 steps with a railing? : A Lot 6 Click Score: 17    End of Session Equipment Utilized During Treatment: Gait belt Activity Tolerance: Patient limited by fatigue;Patient limited by pain Patient left: in chair;with call bell/phone within reach   PT Visit Diagnosis: Difficulty in walking, not elsewhere classified (R26.2);Muscle weakness (generalized) (M62.81)    Time: [id number] PT Time Calculation (min) (ACUTE ONLY): 8 min   Charges:  PT Evaluation $PT Eval Low Complexity: 1 Low        Kati PT, DPT Physical Therapist Acute Rehabilitation Services Office: [place] 04/25/2022, 12:21 PM

## 2022-04-25 NOTE — Progress Notes (Signed)
PROGRESS NOTE    Barry [name]  [id number] DOB: 02-22-1955 DOA: 04/24/2022 PCP: Janith Lima, MD    Chief Complaint  Patient presents with   Pt needs C-Pap machine prior to acceptance to facilty    Brief Narrative:  Patient 67 year old gentleman history of CAD status post CABG x 3, hypertension, hyperlipidemia, cirrhosis, insulin-dependent type 2 diabetes, OSA obesity who was discharged on the day of admission to SNF and sent back from SNF as patient did not have a CPAP machine on him.  SNF also unable to provide CPAP on day of discharge.   Assessment & Plan:   Principal Problem:   Obstructive sleep apnea Active Problems:   Morbid obesity due to excess calories (HCC)   CAD (coronary artery disease)   Depression with anxiety   Essential hypertension, benign   Hyperlipidemia LDL goal <70   Type 2 diabetes mellitus with diabetic neuropathy, with long-term current use of insulin (HCC)   Thrombocytopenia (HCC)   GERD (gastroesophageal reflux disease)   Cirrhosis of liver without ascites (HCC)   Right hip pain   OSA (obstructive sleep apnea)   Sleep apnea in adult  #1 obstructive sleep apnea -Patient discharged the morning of 04/24/2022, to skilled nursing facility, however as patient did not have his own CPAP machine and facility unable to provide equipment this holiday weekend and as such patient sent back to the ED. -Continue CPAP nightly.   2.  History of cirrhosis/EGD in the past showed portal gastropathy and grade 1 varices.- -LFTs noted to be elevated during recent hospitalization and trending down. --Acute hepatitis panel with reactive hep B surface antigen, HCV antibody nonreactive, hepatitis A antibody IgM nonreactive, hep B core antibody IgM equivocal. -HIV nonreactive. -Patient started on lactulose during this last hospitalization which has been continued at 10 g 3 times daily for goal bowel movements of 3-5 bowel movements per day.   -Outpatient follow-up.    3.   Diabetes mellitus type 2 -Hemoglobin A1c 6.7 (03/01/2022). -CBG 127.   -Continue to hold oral hypoglycemic agents.   -SSI.    4.  Hypertension -Continue home regimen of beta-blocker.    5.  Hyperlipidemia -Continue to hold statin and resume on discharge.   6.  CAD/history of CABG -Patient noted to have been on aspirin, Lipitor, lisinopril, Lopressor in the outpatient setting. -Continue home regimen aspirin and Plavix, Lopressor. -Continue to hold statin. -Noted to have been on Lasix which has subsequently been discontinued. -Outpatient follow-up.   7.  Chronic thrombocytopenia secondary to cirrhosis -Patient with no overt bleeding. -Platelet count at 60 K.  -Follow.   8.  Right hip pain/DJD -Patient noted to be scheduled for elective surgery for total hip arthroplasty per orthopedics, Dr. Lyla Glassing on 05/19/2022. -Patient was seen by cardiology in outpatient setting and cleared for surgery. -Continue home regimen oral pain medications.   -PT/OT.    9.  Sarcoidosis -Recently diagnosed after biopsy of the skin from the right back area approximately 3 weeks ago. -Outpatient follow-up.   10.  Acute metabolic encephalopathy-resolved -Patient admitted during last hospitalization (3/24-3/30) with acute encephalopathy. -Patient noted to have elevated ammonia levels during that time and started on lactulose, patient received high-dose vitamin B1 and transition to oral vitamin B1 100 mg daily.  Status post full course of antibiotics. -No further workup needed. -Patient back at baseline.   11.  GERD -Continue PPI.     DVT prophylaxis: SCDs Code Status: Full Family Communication: Updated patient.  No  family at bedside. Disposition: SNF  Status is: Observation The patient remains OBS appropriate and will d/c before 2 midnights.   Consultants:  None  Procedures:  None  Antimicrobials:  None   Subjective: Patient sitting up in the recliner.  Overall feels well.  Denies  any chest pain or shortness of breath.  No abdominal pain.  Just finished working with OT.  Objective: Vitals:   04/24/22 2006 04/24/22 2324 04/25/22 0712 04/25/22 0940  BP:  (!) 148/81  (!) 133/101  Pulse:  (!) 58  (!) 56  Resp:  18    Temp:  97.7 F (36.5 C)  (!) 97.4 F (36.3 C)  TempSrc:  Oral  Oral  SpO2: 97% 100%  100%  Weight:   107.2 kg   Height:   5\' 6"  (1.676 m)     Intake/Output Summary (Last 24 hours) at 04/25/2022 1018 Last data filed at 04/24/2022 2113 Gross per 24 hour  Intake 3 ml  Output --  Net 3 ml   Filed Weights   04/25/22 0712  Weight: 107.2 kg    Examination:  General exam: Appears calm and comfortable  Respiratory system: Clear to auscultation. Respiratory effort normal. Cardiovascular system: S1 & S2 heard, RRR. No JVD, murmurs, rubs, gallops or clicks. No pedal edema. Gastrointestinal system: Abdomen is nondistended, soft and nontender. No organomegaly or masses felt. Normal bowel sounds heard. Central nervous system: Alert and oriented. No focal neurological deficits. Extremities: Symmetric 5 x 5 power. Skin: No rashes, lesions or ulcers Psychiatry: Judgement and insight appear normal. Mood & affect appropriate.     Data Reviewed: I have personally reviewed following labs and imaging studies  CBC: Recent Labs  Lab 04/18/22 1820 04/19/22 0238 04/21/22 0854 04/22/22 0420 04/23/22 0346 04/24/22 0408 04/25/22 0353  WBC 5.8   < > 4.6 4.4 4.7 3.6* 4.5  NEUTROABS 3.4  --   --   --   --   --   --   HGB 12.0*   < > 11.4* 11.1* 12.3* 10.3* 11.7*  HCT 36.9*   < > 34.8* 33.3* 37.1* 31.6* 37.3*  MCV 106.0*   < > 105.8* 104.1* 104.5* 105.7* 107.5*  PLT 76*   < > 60* 55* 63* 52* 60*   < > = values in this interval not displayed.    Basic Metabolic Panel: Recent Labs  Lab 04/21/22 0432 04/22/22 0420 04/23/22 0346 04/24/22 0408 04/25/22 0353  NA 135 135 134* 135 139  K 4.4 3.9 3.7 3.9 4.0  CL 103 103 104 105 107  CO2 24 25 24 25 24    GLUCOSE 118* 135* 139* 246* 143*  BUN 11 12 12 14 16   CREATININE 0.68 0.76 0.80 0.77 0.78  CALCIUM 8.4* 8.1* 8.5* 7.9* 8.6*  MG  --  1.4* 2.0  --   --     GFR: Estimated Creatinine Clearance: 107.1 mL/min (by C-G formula based on SCr of 0.78 mg/dL).  Liver Function Tests: Recent Labs  Lab 04/20/22 0423 04/21/22 0432 04/22/22 0420 04/23/22 0346 04/24/22 0408  AST 190* 154* 119* 119* 90*  ALT 103* 93* 82* 86* 68*  ALKPHOS 126 126 123 139* 120  BILITOT 0.9 1.0 0.8 0.8 0.6  PROT 6.7 7.0 6.5 7.4 6.2*  ALBUMIN 2.8* 2.9* 2.7* 3.0* 2.6*    CBG: Recent Labs  Lab 04/24/22 0748 04/24/22 1145 04/24/22 1758 04/24/22 2327 04/25/22 0934  GLUCAP 182* 107* 144* 209* 127*     Recent Results (from the  past 240 hour(s))  Culture, blood (Routine x 2)     Status: None   Collection Time: 04/18/22  6:20 PM   Specimen: BLOOD  Result Value Ref Range Status   Specimen Description   Final    BLOOD BLOOD RIGHT WRIST Performed at [facility] [address] [place], [place] [address]    Special Requests   Final    BOTTLES DRAWN AEROBIC AND ANAEROBIC Blood Culture adequate volume Performed at [facility] [address] [place], [place] [address]    Culture   Final    NO GROWTH 5 DAYS Performed at [facility], [place] [address] [place], [place] [address]    Report Status 04/23/2022 FINAL  Final  Culture, blood (Routine x 2)     Status: None   Collection Time: 04/18/22  6:49 PM   Specimen: BLOOD  Result Value Ref Range Status   Specimen Description   Final    BLOOD BLOOD LEFT FOREARM Performed at [facility] [address], [place], [place] [address]    Special Requests   Final    BOTTLES DRAWN AEROBIC AND ANAEROBIC Blood Culture adequate volume Performed at [facility] [address], [place], [place] [address]    Culture   Final    NO GROWTH 5 DAYS Performed at [facility],  [place] [address] [place], [place] [address]    Report Status 04/23/2022 FINAL  Final         Radiology Studies: No results found.      Scheduled Meds:  aspirin  81 mg Oral Daily   busPIRone  5 mg Oral TID   ciclopirox   Topical BID   clopidogrel  75 mg Oral Daily   ferrous sulfate  325 mg Oral QAC breakfast   gabapentin  300 mg Oral BID   insulin aspart  0-15 Units Subcutaneous TID WC   lactulose  10 g Oral TID   mometasone-formoterol  2 puff Inhalation BID   pantoprazole  40 mg Oral Daily   senna  1 tablet Oral BID   sertraline  100 mg Oral Daily   sodium chloride flush  3 mL Intravenous Q12H   thiamine  100 mg Oral Daily   Continuous Infusions:   LOS: 0 days    Time spent: 35 minutes    Irine Seal, MD [facility]   To contact the attending provider between 7A-7P or the covering provider during after hours 7P-7A, please log into the web site [email or web address] and access using universal [place] password for that web site. If you do not have the password, please call the hospital operator.  04/25/2022, 10:18 AM

## 2022-04-25 NOTE — Plan of Care (Signed)
Problem: Education: Goal: Ability to describe self-care measures that may prevent or decrease complications (Diabetes Survival Skills Education) will improve 04/25/2022 1329 by Linus Salmons, RN Outcome: Progressing 04/25/2022 1329 by Linus Salmons, RN Outcome: Progressing Goal: Individualized Educational Video(s) 04/25/2022 1329 by Linus Salmons, RN Outcome: Progressing 04/25/2022 1329 by Linus Salmons, RN Outcome: Progressing   Problem: Coping: Goal: Ability to adjust to condition or change in health will improve 04/25/2022 1329 by Linus Salmons, RN Outcome: Progressing 04/25/2022 1329 by Linus Salmons, RN Outcome: Progressing   Problem: Fluid Volume: Goal: Ability to maintain a balanced intake and output will improve 04/25/2022 1329 by Linus Salmons, RN Outcome: Progressing 04/25/2022 1329 by Linus Salmons, RN Outcome: Progressing   Problem: Health Behavior/Discharge Planning: Goal: Ability to identify and utilize available resources and services will improve 04/25/2022 1329 by Linus Salmons, RN Outcome: Progressing 04/25/2022 1329 by Linus Salmons, RN Outcome: Progressing Goal: Ability to manage health-related needs will improve 04/25/2022 1329 by Linus Salmons, RN Outcome: Progressing 04/25/2022 1329 by Linus Salmons, RN Outcome: Progressing   Problem: Metabolic: Goal: Ability to maintain appropriate glucose levels will improve 04/25/2022 1329 by Linus Salmons, RN Outcome: Progressing 04/25/2022 1329 by Linus Salmons, RN Outcome: Progressing   Problem: Nutritional: Goal: Maintenance of adequate nutrition will improve 04/25/2022 1329 by Linus Salmons, RN Outcome: Progressing 04/25/2022 1329 by Linus Salmons, RN Outcome: Progressing Goal: Progress toward achieving an optimal weight will improve 04/25/2022 1329 by Linus Salmons, RN Outcome: Progressing 04/25/2022 1329 by Linus Salmons, RN Outcome: Progressing   Problem: Skin Integrity: Goal: Risk for  impaired skin integrity will decrease 04/25/2022 1329 by Linus Salmons, RN Outcome: Progressing 04/25/2022 1329 by Linus Salmons, RN Outcome: Progressing   Problem: Tissue Perfusion: Goal: Adequacy of tissue perfusion will improve 04/25/2022 1329 by Linus Salmons, RN Outcome: Progressing 04/25/2022 1329 by Linus Salmons, RN Outcome: Progressing   Problem: Education: Goal: Knowledge of General Education information will improve Description: Including pain rating scale, medication(s)/side effects and non-pharmacologic comfort measures 04/25/2022 1329 by Linus Salmons, RN Outcome: Progressing 04/25/2022 1329 by Linus Salmons, RN Outcome: Progressing   Problem: Health Behavior/Discharge Planning: Goal: Ability to manage health-related needs will improve 04/25/2022 1329 by Linus Salmons, RN Outcome: Progressing 04/25/2022 1329 by Linus Salmons, RN Outcome: Progressing   Problem: Clinical Measurements: Goal: Ability to maintain clinical measurements within normal limits will improve 04/25/2022 1329 by Linus Salmons, RN Outcome: Progressing 04/25/2022 1329 by Linus Salmons, RN Outcome: Progressing Goal: Will remain free from infection 04/25/2022 1329 by Linus Salmons, RN Outcome: Progressing 04/25/2022 1329 by Linus Salmons, RN Outcome: Progressing Goal: Diagnostic test results will improve 04/25/2022 1329 by Linus Salmons, RN Outcome: Progressing 04/25/2022 1329 by Linus Salmons, RN Outcome: Progressing Goal: Respiratory complications will improve 04/25/2022 1329 by Linus Salmons, RN Outcome: Progressing 04/25/2022 1329 by Linus Salmons, RN Outcome: Progressing Goal: Cardiovascular complication will be avoided 04/25/2022 1329 by Linus Salmons, RN Outcome: Progressing 04/25/2022 1329 by Linus Salmons, RN Outcome: Progressing   Problem: Activity: Goal: Risk for activity intolerance will decrease 04/25/2022 1329 by Linus Salmons, RN Outcome: Progressing 04/25/2022  1329 by Linus Salmons, RN Outcome: Progressing   Problem: Nutrition: Goal: Adequate nutrition will be maintained 04/25/2022 1329 by Linus Salmons, RN Outcome: Progressing 04/25/2022 1329 by Linus Salmons, RN Outcome: Progressing  Problem: Coping: Goal: Level of anxiety will decrease 04/25/2022 1329 by Linus Salmons, RN Outcome: Progressing 04/25/2022 1329 by Linus Salmons, RN Outcome: Progressing   Problem: Elimination: Goal: Will not experience complications related to bowel motility 04/25/2022 1329 by Linus Salmons, RN Outcome: Progressing 04/25/2022 1329 by Linus Salmons, RN Outcome: Progressing Goal: Will not experience complications related to urinary retention 04/25/2022 1329 by Linus Salmons, RN Outcome: Progressing 04/25/2022 1329 by Linus Salmons, RN Outcome: Progressing   Problem: Pain Managment: Goal: General experience of comfort will improve 04/25/2022 1329 by Linus Salmons, RN Outcome: Progressing 04/25/2022 1329 by Linus Salmons, RN Outcome: Progressing   Problem: Safety: Goal: Ability to remain free from injury will improve 04/25/2022 1329 by Linus Salmons, RN Outcome: Progressing 04/25/2022 1329 by Linus Salmons, RN Outcome: Progressing   Problem: Skin Integrity: Goal: Risk for impaired skin integrity will decrease 04/25/2022 1329 by Linus Salmons, RN Outcome: Progressing 04/25/2022 1329 by Linus Salmons, RN Outcome: Progressing

## 2022-04-25 NOTE — Plan of Care (Signed)

## 2022-04-26 DIAGNOSIS — I10 Essential (primary) hypertension: Secondary | ICD-10-CM | POA: Diagnosis not present

## 2022-04-26 DIAGNOSIS — G4733 Obstructive sleep apnea (adult) (pediatric): Secondary | ICD-10-CM | POA: Diagnosis not present

## 2022-04-26 DIAGNOSIS — I25118 Atherosclerotic heart disease of native coronary artery with other forms of angina pectoris: Secondary | ICD-10-CM | POA: Diagnosis not present

## 2022-04-26 LAB — GLUCOSE, CAPILLARY
Glucose-Capillary: 157 mg/dL — ABNORMAL HIGH (ref 70–99)
Glucose-Capillary: 170 mg/dL — ABNORMAL HIGH (ref 70–99)
Glucose-Capillary: 133 mg/dL — ABNORMAL HIGH (ref 70–99)
Glucose-Capillary: 181 mg/dL — ABNORMAL HIGH (ref 70–99)

## 2022-04-26 NOTE — TOC Progression Note (Signed)
Transition of Care [facility]) - Progression Note    Patient Details  Name: Barry [name] MRN: [id number] Date of Birth: 09/16/1955  [facility] CM/SW Contact  Joaquin Courts, RN Phone Number: 04/26/2022, 10:57 AM  Clinical Narrative:    CM spoke with [facility] rep Kia, who reports patient's friend has not been able to bring in cpap because the cpap at home does not work.  Facility reports will need to order a cpap for patient use in the facility and they will be placing this order today.  CM spoke with bedside RN who reports patient is on auto titrate setting and uses a full face mask, this information was provided to Kia.  Cm awaiting confirmation of a timeline when cpap machine is expected to be available at the facility.        Expected Discharge Plan and Services                                               Social Determinants of Health (SDOH) Interventions SDOH Screenings   Food Insecurity: No Food Insecurity (04/24/2022)  Housing: Low Risk  (04/24/2022)  Transportation Needs: No Transportation Needs (04/24/2022)  Utilities: Not At Risk (04/24/2022)  Depression (PHQ2-9): Low Risk  (05/16/2019)  Tobacco Use: Low Risk  (04/24/2022)    Readmission Risk Interventions     No data to display

## 2022-04-26 NOTE — TOC Progression Note (Signed)
Transition of Care [facility]) - Progression Note    Patient Details  Name: Barry [name] MRN: [id number] Date of Birth: 29-Aug-1955  Transition of Care [facility] CM/SW Contact  Joaquin Courts, RN Phone Number: 04/26/2022, 12:56 PM  Clinical Narrative:    [facility] reports will have cpap tomorrow and can accept patient at that time.        Expected Discharge Plan and Services                                               Social Determinants of Health (SDOH) Interventions SDOH Screenings   Food Insecurity: No Food Insecurity (04/24/2022)  Housing: Low Risk  (04/24/2022)  Transportation Needs: No Transportation Needs (04/24/2022)  Utilities: Not At Risk (04/24/2022)  Depression (PHQ2-9): Low Risk  (05/16/2019)  Tobacco Use: Low Risk  (04/24/2022)    Readmission Risk Interventions     No data to display

## 2022-04-26 NOTE — Progress Notes (Signed)
Mobility Specialist - Progress Note   04/26/22 1117  Mobility  Activity Ambulated with assistance in hallway  Level of Assistance Standby assist, set-up cues, supervision of patient - no hands on  Assistive Device Front wheel walker  Distance Ambulated (ft) 350 ft  Activity Response Tolerated well  Mobility Referral Yes  $Mobility charge 1 Mobility   Pt received in chair and agreed to mobility. Pt had no c/o pain nor discomfort during session. Pt returned to chair with all needs met.   Roderick Pee Mobility Specialist

## 2022-04-26 NOTE — Progress Notes (Signed)
PROGRESS NOTE    Barry [name]  [id number] DOB: 19-Apr-1955 DOA: 04/24/2022 PCP: Janith [place], MD    Chief Complaint  Patient presents with   Pt needs C-Pap machine prior to acceptance to facilty    Brief Narrative:  Patient 67 year old gentleman history of CAD status post CABG x 3, hypertension, hyperlipidemia, cirrhosis, insulin-dependent type 2 diabetes, OSA obesity who was discharged on the day of admission to SNF and sent back from SNF as patient did not have a CPAP machine on him.  SNF also unable to provide CPAP on day of discharge.   Assessment & Plan:   Principal Problem:   Obstructive sleep apnea Active Problems:   Morbid obesity due to excess calories   CAD (coronary artery disease)   Depression with anxiety   Essential hypertension, benign   Hyperlipidemia LDL goal <70   Type 2 diabetes mellitus with diabetic neuropathy, with long-term current use of insulin   Thrombocytopenia   GERD (gastroesophageal reflux disease)   Cirrhosis of liver without ascites   Right hip pain   OSA (obstructive sleep apnea)   Sleep apnea in adult  #1 obstructive sleep apnea -Patient discharged the morning of 04/24/2022, to skilled nursing facility, however as patient did not have his own CPAP machine and facility unable to provide equipment this past holiday weekend and as such patient sent back to the ED. -Continue CPAP nightly.   2.  History of cirrhosis/EGD in the past showed portal gastropathy and grade 1 varices.- -LFTs noted to be elevated during recent hospitalization and trending down. --Acute hepatitis panel with reactive hep B surface antigen, HCV antibody nonreactive, hepatitis A antibody IgM nonreactive, hep B core antibody IgM equivocal. -HIV nonreactive. -Patient started on lactulose during this last hospitalization which has been continued at 10 g 3 times daily for goal bowel movements of 3-5 bowel movements per day.   -Outpatient follow-up.    3.  Diabetes mellitus  type 2 -Hemoglobin A1c 6.7 (03/01/2022). -CBG 157 this morning. -Continue to hold oral hypoglycemic agents. -SSI.   4.  Hypertension -Continue beta-blocker.   5.  Hyperlipidemia -Statin on hold.  Resume on discharge.    6.  CAD/history of CABG -Patient noted to have been on aspirin, Lipitor, lisinopril, Lopressor in the outpatient setting. -Continue home regimen aspirin and Plavix, Lopressor. -Continue to hold statin. -Noted to have been on Lasix which has subsequently been discontinued. -Outpatient follow-up.   7.  Chronic thrombocytopenia secondary to cirrhosis -Patient with no overt bleeding. -Platelet count at 60 K (04/25/2022)..  -Follow.   8.  Right hip pain/DJD -Patient noted to be scheduled for elective surgery for total hip arthroplasty per orthopedics, Dr. Lyla Glassing on 05/19/2022. -Patient was seen by cardiology in outpatient setting and cleared for surgery. -Continue home regimen oral pain medications.   -PT/OT.  -Outpatient follow-up with orthopedics.   9.  Sarcoidosis -Recently diagnosed after biopsy of the skin from the right back area approximately 3 weeks ago. -Outpatient follow-up.   10.  Acute metabolic encephalopathy-resolved -Patient admitted during last hospitalization (3/24-3/30) with acute encephalopathy. -Patient noted to have elevated ammonia levels during that time and started on lactulose, patient received high-dose vitamin B1 and transition to oral vitamin B1 100 mg daily.  Status post full course of antibiotics. -No further workup needed. -Patient back at baseline.   11.  GERD -PPI.     DVT prophylaxis: SCDs Code Status: Full Family Communication: Updated patient.  No family at bedside. Disposition: Patient medically  stable as of 04/24/2022.  Awaiting SNF placement.  Status is: Observation The patient remains OBS appropriate and will d/c before 2 midnights.   Consultants:  None  Procedures:  None  Antimicrobials:   None   Subjective: Sitting up in chair.  No chest pain, no shortness of breath, no abdominal pain.  Feels well.  Awaiting SNF placement.    Objective: Vitals:   04/25/22 2029 04/25/22 2111 04/26/22 0500 04/26/22 0519  BP:  105/88  118/63  Pulse:  62  (!) 59  Resp:  16  14  Temp:  (!) 97.5 F (36.4 C)  97.8 F (36.6 C)  TempSrc:  Oral  Oral  SpO2: 97% 98%  99%  Weight:   102.9 kg   Height:        Intake/Output Summary (Last 24 hours) at 04/26/2022 1100 Last data filed at 04/25/2022 1300 Gross per 24 hour  Intake 240 ml  Output --  Net 240 ml    Filed Weights   04/25/22 0712 04/26/22 0500  Weight: 107.2 kg 102.9 kg    Examination:  General exam: NAD. Respiratory system: CTAB.  No wheezes, no crackles, no rhonchi.  Fair air movement.  Speaking in full sentences.  Cardiovascular system: Regular rate rhythm no murmurs rubs or gallops.  No JVD.  No lower extremity edema. Gastrointestinal system: Abdomen is soft, nontender, nondistended, positive bowel sounds.  No rebound.  No guarding.  Central nervous system: Alert and oriented. No focal neurological deficits. Extremities: Symmetric 5 x 5 power. Skin: No rashes, lesions or ulcers Psychiatry: Judgement and insight appear normal. Mood & affect appropriate.     Data Reviewed: I have personally reviewed following labs and imaging studies  CBC: Recent Labs  Lab 04/21/22 0854 04/22/22 0420 04/23/22 0346 04/24/22 0408 04/25/22 0353  WBC 4.6 4.4 4.7 3.6* 4.5  HGB 11.4* 11.1* 12.3* 10.3* 11.7*  HCT 34.8* 33.3* 37.1* 31.6* 37.3*  MCV 105.8* 104.1* 104.5* 105.7* 107.5*  PLT 60* 55* 63* 52* 60*     Basic Metabolic Panel: Recent Labs  Lab 04/21/22 0432 04/22/22 0420 04/23/22 0346 04/24/22 0408 04/25/22 0353  NA 135 135 134* 135 139  K 4.4 3.9 3.7 3.9 4.0  CL 103 103 104 105 107  CO2 24 25 24 25 24   GLUCOSE 118* 135* 139* 246* 143*  BUN 11 12 12 14 16   CREATININE 0.68 0.76 0.80 0.77 0.78  CALCIUM 8.4* 8.1*  8.5* 7.9* 8.6*  MG  --  1.4* 2.0  --   --      GFR: Estimated Creatinine Clearance: 104.8 mL/min (by C-G formula based on SCr of 0.78 mg/dL).  Liver Function Tests: Recent Labs  Lab 04/20/22 0423 04/21/22 0432 04/22/22 0420 04/23/22 0346 04/24/22 0408  AST 190* 154* 119* 119* 90*  ALT 103* 93* 82* 86* 68*  ALKPHOS 126 126 123 139* 120  BILITOT 0.9 1.0 0.8 0.8 0.6  PROT 6.7 7.0 6.5 7.4 6.2*  ALBUMIN 2.8* 2.9* 2.7* 3.0* 2.6*     CBG: Recent Labs  Lab 04/25/22 0934 04/25/22 1224 04/25/22 1659 04/25/22 2117 04/26/22 0818  GLUCAP 127* 127* 136* 158* 157*      Recent Results (from the past 240 hour(s))  Culture, blood (Routine x 2)     Status: None   Collection Time: 04/18/22  6:20 PM   Specimen: BLOOD  Result Value Ref Range Status   Specimen Description   Final    BLOOD BLOOD RIGHT WRIST Performed at [facility]  [facility], [place] [address] [place], [place] [address]    Special Requests   Final    BOTTLES DRAWN AEROBIC AND ANAEROBIC Blood Culture adequate volume Performed at [facility] [address] [place], [place] [address]    Culture   Final    NO GROWTH 5 DAYS Performed at [facility], [place] [address] [place], [place] [address]    Report Status 04/23/2022 FINAL  Final  Culture, blood (Routine x 2)     Status: None   Collection Time: 04/18/22  6:49 PM   Specimen: BLOOD  Result Value Ref Range Status   Specimen Description   Final    BLOOD BLOOD LEFT FOREARM Performed at [facility] [address], [place], [place] [address]    Special Requests   Final    BOTTLES DRAWN AEROBIC AND ANAEROBIC Blood Culture adequate volume Performed at [facility] [address], [place], [place] [address]    Culture   Final    NO GROWTH 5 DAYS Performed at [facility], [place] [address], [place], [place] [address]    Report Status 04/23/2022 FINAL  Final         Radiology  Studies: No results found.      Scheduled Meds:  aspirin  81 mg Oral Daily   busPIRone  5 mg Oral TID   ciclopirox   Topical BID   clopidogrel  75 mg Oral Daily   ferrous sulfate  325 mg Oral QAC breakfast   gabapentin  300 mg Oral BID   insulin aspart  0-15 Units Subcutaneous TID WC   lactulose  10 g Oral TID   mometasone-formoterol  2 puff Inhalation BID   pantoprazole  40 mg Oral Daily   senna  1 tablet Oral BID   sertraline  100 mg Oral Daily   sodium chloride flush  3 mL Intravenous Q12H   thiamine  100 mg Oral Daily   Continuous Infusions:   LOS: 0 days    Time spent: 35 minutes    Irine Seal, MD [facility]   To contact the attending provider between 7A-7P or the covering provider during after hours 7P-7A, please log into the web site [email or web address] and access using universal [place] password for that web site. If you do not have the password, please call the hospital operator.  04/26/2022, 11:00 AM

## 2022-04-27 DIAGNOSIS — G4733 Obstructive sleep apnea (adult) (pediatric): Secondary | ICD-10-CM | POA: Diagnosis not present

## 2022-04-27 DIAGNOSIS — I25118 Atherosclerotic heart disease of native coronary artery with other forms of angina pectoris: Secondary | ICD-10-CM | POA: Diagnosis not present

## 2022-04-27 DIAGNOSIS — K7460 Unspecified cirrhosis of liver: Secondary | ICD-10-CM | POA: Diagnosis not present

## 2022-04-27 DIAGNOSIS — G4730 Sleep apnea, unspecified: Secondary | ICD-10-CM

## 2022-04-27 LAB — GLUCOSE, CAPILLARY
Glucose-Capillary: 140 mg/dL — ABNORMAL HIGH (ref 70–99)
Glucose-Capillary: 131 mg/dL — ABNORMAL HIGH (ref 70–99)
Glucose-Capillary: 174 mg/dL — ABNORMAL HIGH (ref 70–99)
Glucose-Capillary: 216 mg/dL — ABNORMAL HIGH (ref 70–99)

## 2022-04-27 NOTE — TOC Progression Note (Signed)
Transition of Care [facility]) - Progression Note    Patient Details  Name: Barry [name] MRN: [id number] Date of Birth: 1955/02/24  [facility] CM/SW Contact  Joaquin Courts, RN Phone Number: 04/27/2022, 12:55 PM  Clinical Narrative:     [facility] has confirmed they now have all needed supplies and cpap to accept patient.  Patient assigned to room 126B, report number [phone].  MD updated and dc summary requested.  Transport arrangements are pending discharge summary completion.       Expected Discharge Plan and Services                                               Social Determinants of Health (SDOH) Interventions SDOH Screenings   Food Insecurity: No Food Insecurity (04/24/2022)  Housing: Low Risk  (04/24/2022)  Transportation Needs: No Transportation Needs (04/24/2022)  Utilities: Not At Risk (04/24/2022)  Depression (PHQ2-9): Low Risk  (05/16/2019)  Tobacco Use: Low Risk  (04/24/2022)    Readmission Risk Interventions     No data to display

## 2022-04-27 NOTE — Discharge Summary (Signed)
Physician Discharge Summary  Barry [name] [id number] DOB: 16-Nov-1955 DOA: 04/24/2022  PCP: Janith Lima, MD  Admit date: 04/24/2022 Discharge date: 04/27/2022  Time spent: 60 minutes  Recommendations for Outpatient Follow-up:  Follow-up with MD at skilled nursing facility.  Patient will need comprehensive metabolic profile done in 1 week for follow-up on electrolytes, renal function, LFTs.  Patient will also need a CBC done to follow-up on counts, patient with a chronic thrombocytopenia.   Discharge Diagnoses:  Principal Problem:   Obstructive sleep apnea Active Problems:   Morbid obesity due to excess calories   CAD (coronary artery disease)   Depression with anxiety   Essential hypertension, benign   Hyperlipidemia LDL goal <70   Type 2 diabetes mellitus with diabetic neuropathy, with long-term current use of insulin   Thrombocytopenia   GERD (gastroesophageal reflux disease)   Cirrhosis of liver without ascites   Right hip pain   OSA (obstructive sleep apnea)   Sleep apnea in adult   Discharge Condition: Stable and improved.  Diet recommendation: Heart healthy  Filed Weights   04/25/22 0712 04/26/22 0500 04/27/22 [id number]  Weight: 107.2 kg 102.9 kg 104.1 kg    History of present illness:   Barry [name] is a 67 y.o. male with medical history significant of CAD status post CABG x 3, hypertension, hyperlipidemia, cirrhosis, insulin-dependent type 2 diabetes, obstructive sleep apnea, obesity presented to the ED as he was sent back from skilled nursing facility as he did not have his CPAP machine. Patient was discharged by me this morning around 1130 to skilled nursing facility.  Once patient got to the skilled nursing facility due to not having his own CPAP machine and facility unable to provide CPAP over this holiday weekend patient was sent back from facility to the ED due to lack of equipment. Patient denies any fevers, no chills, no nausea, no vomiting, no chest pain, no  shortness of breath, no abdominal pain, no melena, no hematemesis, no hematochezia, no diarrhea, no constipation, no lightheadedness, no syncopal episodes, no other associated symptoms.   ED Course: Patient seen in the ED, vitals noted to be stable.  Social work contacted however unable to place patient and as such hospitalist called to admit the patient.  Hospital Course:  #1 obstructive sleep apnea -Patient discharged the morning of 04/24/2022, to skilled nursing facility, however as patient did not have his own CPAP machine and facility unable to provide equipment this past holiday weekend and as such patient sent back to the ED. -Patient maintained on CPAP nightly during the hospitalization.   -Outpatient follow-up.    2.  History of cirrhosis/EGD in the past showed portal gastropathy and grade 1 varices.- -LFTs noted to be elevated during recent hospitalization and trending down. --Acute hepatitis panel with reactive hep B surface antigen, HCV antibody nonreactive, hepatitis A antibody IgM nonreactive, hep B core antibody IgM equivocal. -HIV nonreactive. -Patient started on lactulose during this last hospitalization which has been continued at 10 g 3 times daily for goal bowel movements of 3-5 bowel movements per day.   -Outpatient follow-up.    3.  Diabetes mellitus type 2 -Hemoglobin A1c 6.7 (03/01/2022). -Patient's oral hypoglycemic agents were held during the hospitalization and patient maintained on sliding scale insulin.    4.  Hypertension -Patient was maintained on beta-blocker.   5.  Hyperlipidemia -Statin held during the hospitalization will be resumed on discharge.    6.  CAD/history of CABG -Patient noted to have been  on aspirin, Lipitor, lisinopril, Lopressor in the outpatient setting. -Patient was maintained on home regimen aspirin and Plavix, Lopressor. -Statin held during the hospitalization will be resumed on discharge at a later date.   -Noted to have been on Lasix  which has subsequently been discontinued. -Outpatient follow-up.   7.  Chronic thrombocytopenia secondary to cirrhosis -Patient with no overt bleeding. -Platelet count stabilized at at 60 K (04/25/2022)..    8.  Right hip pain/DJD -Patient noted to be scheduled for elective surgery for total hip arthroplasty per orthopedics, Dr. Lyla Glassing on 05/19/2022. -Patient was seen by cardiology in outpatient setting and cleared for surgery. -Patient was maintained on home regimen oral pain medications.   -PT/OT.  -Outpatient follow-up with orthopedics.   9.  Sarcoidosis -Recently diagnosed after biopsy of the skin from the right back area approximately 3 weeks ago. -Outpatient follow-up.   10.  Acute metabolic encephalopathy-resolved -Patient admitted during last hospitalization (3/24-3/30) with acute encephalopathy. -Patient noted to have elevated ammonia levels during that time and started on lactulose, patient received high-dose vitamin B1 and transitioned to oral vitamin B1 100 mg daily.  Status post full course of antibiotics. -No further workup needed. -Patient back at baseline.   11.  GERD -Patient was maintained on a PPI.    Procedures: None  Consultations: None  Discharge Exam: Vitals:   04/27/22 0851 04/27/22 1230  BP:  118/81  Pulse:  68  Resp:  18  Temp:  98.2 F (36.8 C)  SpO2: 94% 98%    General: NAD Cardiovascular: RRR no murmurs rubs or gallops.  No JVD.  No lower extremity edema. Respiratory: Clear to auscultation bilaterally.  No wheezes, no crackles, no rhonchi.  Fair air movement.  Speaking in full sentences.  Discharge Instructions   Discharge Instructions     Diet - low sodium heart healthy   Complete by: As directed    Increase activity slowly   Complete by: As directed       Allergies as of 04/27/2022   No Known Allergies      Medication List     TAKE these medications    acetaminophen 500 MG tablet Commonly known as: TYLENOL Take 1  tablet (500 mg total) by mouth every 8 (eight) hours as needed for moderate pain.   albuterol 108 (90 Base) MCG/ACT inhaler Commonly known as: VENTOLIN HFA Inhale 2 puffs into the lungs every 6 (six) hours as needed for wheezing or shortness of breath.   aspirin 81 MG chewable tablet Chew 81 mg by mouth daily.   atorvastatin 80 MG tablet Commonly known as: LIPITOR Take 1 tablet (80 mg total) by mouth daily. Start taking on: May 08, 2022   busPIRone 15 MG tablet Commonly known as: BUSPAR TAKE 1 TABLET BY MOUTH THREE TIMES DAILY FOR ANXIETY   ciclopirox 0.77 % cream Commonly known as: Loprox Apply topically 2 (two) times daily.   clopidogrel 75 MG tablet Commonly known as: PLAVIX APPOINTMENT OVERDUE Take 1 by mouth daily What changed:  how much to take how to take this when to take this additional instructions   CONTOUR NEXT EZ MONITOR w/Device Kit Test blood sugar three times daily E11.22   cyclobenzaprine 10 MG tablet Commonly known as: FLEXERIL Take 10 mg by mouth 2 (two) times daily as needed.   FeroSul 325 (65 FE) MG tablet Generic drug: ferrous sulfate Take 325 mg by mouth daily.   fluticasone 0.05 % cream Commonly known as: CUTIVATE Apply 1 Application  topically 2 (two) times daily as needed (for itching- face).   gabapentin 300 MG capsule Commonly known as: NEURONTIN Take 300 mg by mouth in the morning and at bedtime.   lactulose 10 GM/15ML solution Commonly known as: CHRONULAC Take 15 mLs (10 g total) by mouth 3 (three) times daily.   metFORMIN 1000 MG tablet Commonly known as: GLUCOPHAGE TAKE 1 TABLET(1000 MG) BY MOUTH TWICE DAILY What changed: See the new instructions.   metoprolol tartrate 25 MG tablet Commonly known as: LOPRESSOR Take 0.5 tablets (12.5 mg total) by mouth 2 (two) times daily.   oxyCODONE-acetaminophen 10-325 MG tablet Commonly known as: PERCOCET Take 1 tablet by mouth every 8 (eight) hours as needed for pain.    pantoprazole 40 MG tablet Commonly known as: PROTONIX TAKE 1 TABLET BY MOUTH EVERY DAY   sertraline 100 MG tablet Commonly known as: ZOLOFT TAKE 2 TABLETS(200 MG) BY MOUTH DAILY What changed: See the new instructions.   Symbicort 80-4.5 MCG/ACT inhaler Generic drug: budesonide-formoterol Inhale 2 puffs into the lungs in the morning and at bedtime.   thiamine 100 MG tablet Commonly known as: Vitamin B-1 Take 1 tablet (100 mg total) by mouth daily.       No Known Allergies  Follow-up Information     MD AT SNF Follow up.                   The results of significant diagnostics from this hospitalization (including imaging, microbiology, ancillary and laboratory) are listed below for reference.    Significant Diagnostic Studies: CT Head Wo Contrast  Result Date: 04/18/2022 CLINICAL DATA:  Mental status changes, unknown cause. EXAM: CT HEAD WITHOUT CONTRAST TECHNIQUE: Contiguous axial images were obtained from the base of the skull through the vertex without intravenous contrast. RADIATION DOSE REDUCTION: This exam was performed according to the departmental dose-optimization program which includes automated exposure control, adjustment of the mA and/or kV according to patient size and/or use of iterative reconstruction technique. COMPARISON:  Prior head CTs are unavailable in PACS at this time. Comparison is made with report of the most recent head CTs dated 04/01/2020 and 04/06/2020 FINDINGS: Brain: Some images are motion limited with loss of fine detail. There is mild cerebral atrophy and small-vessel disease. The ventricles are normal in size and position. Cerebellum and brainstem are unremarkable. No old territorial infarct is seen. Allowing for motion no acute cortical based infarct, hemorrhage, or mass effect is evident. There is no midline shift. The basal cisterns are clear. Vascular: There are patchy calcifications of the carotid siphons. No hyperdense central vessel is  seen. Skull: Negative for fractures or focal lesions. Sinuses/Orbits: No acute orbital findings. Old left lens replacement. A right globe prosthesis. Clear sinuses and mastoid air cells. Other: None. IMPRESSION: No acute intracranial CT findings. Mild atrophy and small-vessel disease. Carotid atherosclerosis. Electronically Signed   By: Telford Nab M.D.   On: 04/18/2022 20:21   DG Chest Port 1 View  Result Date: 04/18/2022 CLINICAL DATA:  Sepsis. Weak and lethargic. Dysuria and dark urine for 3 days. EXAM: PORTABLE CHEST 1 VIEW COMPARISON:  12/30/2021 FINDINGS: Postoperative changes in the mediastinum. Shallow inspiration. Heart size and pulmonary vascularity are normal for technique. Probable small bilateral pleural effusions with basilar atelectasis. No pneumothorax. Mediastinal contours appear intact. IMPRESSION: Shallow inspiration. Probable small pleural effusions with basilar atelectasis. Electronically Signed   By: Lucienne Capers M.D.   On: 04/18/2022 19:32   MYOCARDIAL PERFUSION IMAGING  Result Date: 04/09/2022  The study is normal. The study is low risk.   No ST deviation was noted.   LV perfusion is normal. There is no evidence of ischemia. There is no evidence of infarction.   Left ventricular function is normal. End diastolic cavity size is normal. End systolic cavity size is normal.   Prior study available for comparison from 04/01/2017. Normal stress nuclear study with no ischemia or infarction; EF 62 with normal wall motion.   ECHOCARDIOGRAM COMPLETE  Result Date: 04/09/2022    ECHOCARDIOGRAM REPORT   Patient Name:   VINN ZANER Ator    Date of Exam: 04/09/2022 Medical Rec #:  [id number]     Height:       66.0 in Accession #:    [id number]    Weight:       233.0 lb Date of Birth:  1955/09/01      BSA:          2.134 m Patient Age:    43 years      BP:           122/60 mmHg Patient Gender: M             HR:           86 bpm. Exam Location:  [facility] Procedure: 2D Echo, Cardiac Doppler,  Color Doppler and Intracardiac            Opacification Agent Indications:    I25.10 Coronary artery disease                 I10 Hypertension  History:        Patient has prior history of Echocardiogram examinations, most                 recent 03/30/2017. Previous Myocardial Infarction and CAD, Prior                 CABG; Risk Factors:Hypertension and Diabetes.  Sonographer:    Wilford Sports Rodgers-Jones RDCS Referring Phys: [place]  1. Left ventricular ejection fraction, by estimation, is >75%. The left ventricle has hyperdynamic function. The left ventricle has no regional wall motion abnormalities. There is mild asymmetric left ventricular hypertrophy of the basal-septal segment.  Indeterminate diastolic filling due to E-A fusion.  2. Right ventricular systolic function is normal. The right ventricular size is normal. There is normal pulmonary artery systolic pressure. The estimated right ventricular systolic pressure is Q000111Q mmHg.  3. No evidence of mitral valve regurgitation.  4. Aortic valve regurgitation is not visualized.  5. The inferior vena cava is normal in size with greater than 50% respiratory variability, suggesting right atrial pressure of 3 mmHg. FINDINGS  Left Ventricle: Left ventricular ejection fraction, by estimation, is >75%. The left ventricle has hyperdynamic function. The left ventricle has no regional wall motion abnormalities. Definity contrast agent was given IV to delineate the left ventricular endocardial borders. The left ventricular internal cavity size was normal in size. There is mild asymmetric left ventricular hypertrophy of the basal-septal segment. Indeterminate diastolic filling due to E-A fusion. Right Ventricle: The right ventricular size is normal. Right ventricular systolic function is normal. There is normal pulmonary artery systolic pressure. The tricuspid regurgitant velocity is 2.76 m/s, and with an assumed right atrial pressure of 3 mmHg,  the  estimated right ventricular systolic pressure is Q000111Q mmHg. Left Atrium: Left atrial size was normal in size. Right Atrium: Right atrial size was normal in size. Pericardium: There is no  evidence of pericardial effusion. Mitral Valve: No evidence of mitral valve regurgitation. Tricuspid Valve: Tricuspid valve regurgitation is mild. Aortic Valve: Aortic valve regurgitation is not visualized. Pulmonic Valve: Pulmonic valve regurgitation is trivial. Aorta: The aortic root and ascending aorta are structurally normal, with no evidence of dilitation. Venous: The inferior vena cava is normal in size with greater than 50% respiratory variability, suggesting right atrial pressure of 3 mmHg. IAS/Shunts: No atrial level shunt detected by color flow Doppler.  LEFT VENTRICLE PLAX 2D LVIDd:         4.80 cm   Diastology LVIDs:         1.90 cm   LV e' medial:    6.42 cm/s LV PW:         1.00 cm   LV E/e' medial:  11.8 LV IVS:        0.90 cm   LV e' lateral:   12.60 cm/s LVOT diam:     2.00 cm   LV E/e' lateral: 6.0 LV SV:         93 LV SV Index:   43 LVOT Area:     3.14 cm  RIGHT VENTRICLE             IVC RV Basal diam:  4.40 cm     IVC diam: 1.50 cm RV S prime:     11.43 cm/s TAPSE (M-mode): 1.8 cm LEFT ATRIUM             Index        RIGHT ATRIUM           Index LA diam:        5.10 cm 2.39 cm/m   RA Area:     15.00 cm LA Vol (A2C):   54.0 ml 25.31 ml/m  RA Volume:   39.10 ml  18.32 ml/m LA Vol (A4C):   70.5 ml 33.04 ml/m LA Biplane Vol: 65.2 ml 30.55 ml/m  AORTIC VALVE LVOT Vmax:   161.00 cm/s LVOT Vmean:  114.000 cm/s LVOT VTI:    0.295 m  AORTA Ao Root diam: 3.40 cm Ao Asc diam:  3.60 cm MITRAL VALVE               TRICUSPID VALVE MV Area (PHT): 3.60 cm    TR Peak grad:   30.5 mmHg MV Decel Time: 211 msec    TR Vmax:        276.00 cm/s MV E velocity: 75.50 cm/s MV A velocity: 77.60 cm/s  SHUNTS MV E/A ratio:  0.97        Systemic VTI:  0.30 m                            Systemic Diam: 2.00 cm Phineas Inches Electronically  signed by Phineas Inches Signature Date/Time: 04/09/2022/10:36:57 AM    Final     Microbiology: Recent Results (from the past 240 hour(s))  Culture, blood (Routine x 2)     Status: None   Collection Time: 04/18/22  6:20 PM   Specimen: BLOOD  Result Value Ref Range Status   Specimen Description   Final    BLOOD BLOOD RIGHT WRIST Performed at [facility], [place] [address], [place], [place] [address]    Special Requests   Final    BOTTLES DRAWN AEROBIC AND ANAEROBIC Blood Culture adequate volume Performed at [facility] [address] [place],  [address]  Culture   Final    NO GROWTH 5 DAYS Performed at [facility], [place] [address] [place], [place] [address]    Report Status 04/23/2022 FINAL  Final  Culture, blood (Routine x 2)     Status: None   Collection Time: 04/18/22  6:49 PM   Specimen: BLOOD  Result Value Ref Range Status   Specimen Description   Final    BLOOD BLOOD LEFT FOREARM Performed at [facility] [address] [place], [place] [address]    Special Requests   Final    BOTTLES DRAWN AEROBIC AND ANAEROBIC Blood Culture adequate volume Performed at [facility] [address] [place], [place] [address]    Culture   Final    NO GROWTH 5 DAYS Performed at [facility], [place] [address] [place], [place] [address]    Report Status 04/23/2022 FINAL  Final     Labs: Basic Metabolic Panel: Recent Labs  Lab 04/21/22 0432 04/22/22 0420 04/23/22 0346 04/24/22 0408 04/25/22 0353  NA 135 135 134* 135 139  K 4.4 3.9 3.7 3.9 4.0  CL 103 103 104 105 107  CO2 24 25 24 25 24   GLUCOSE 118* 135* 139* 246* 143*  BUN 11 12 12 14 16   CREATININE 0.68 0.76 0.80 0.77 0.78  CALCIUM 8.4* 8.1* 8.5* 7.9* 8.6*  MG  --  1.4* 2.0  --   --    Liver Function Tests: Recent Labs  Lab 04/21/22 0432 04/22/22 0420 04/23/22 0346 04/24/22 0408  AST 154* 119* 119* 90*  ALT 93* 82* 86*  68*  ALKPHOS 126 123 139* 120  BILITOT 1.0 0.8 0.8 0.6  PROT 7.0 6.5 7.4 6.2*  ALBUMIN 2.9* 2.7* 3.0* 2.6*   No results for input(s): "LIPASE", "AMYLASE" in the last 168 hours. Recent Labs  Lab 04/21/22 0432  AMMONIA 40*   CBC: Recent Labs  Lab 04/21/22 0854 04/22/22 0420 04/23/22 0346 04/24/22 0408 04/25/22 0353  WBC 4.6 4.4 4.7 3.6* 4.5  HGB 11.4* 11.1* 12.3* 10.3* 11.7*  HCT 34.8* 33.3* 37.1* 31.6* 37.3*  MCV 105.8* 104.1* 104.5* 105.7* 107.5*  PLT 60* 55* 63* 52* 60*   Cardiac Enzymes: No results for input(s): "CKTOTAL", "CKMB", "CKMBINDEX", "TROPONINI" in the last 168 hours. BNP: BNP (last 3 results) No results for input(s): "BNP" in the last 8760 hours.  ProBNP (last 3 results) No results for input(s): "PROBNP" in the last 8760 hours.  CBG: Recent Labs  Lab 04/26/22 1251 04/26/22 1655 04/26/22 2029 04/27/22 0728 04/27/22 1144  GLUCAP 170* 133* 181* 140* 131*       Signed:  Irine Seal MD.  [facility] 04/27/2022, 3:28 PM

## 2022-04-27 NOTE — TOC Progression Note (Signed)
Transition of Care [facility]) - Progression Note    Patient Details  Name: Barry [name] MRN: [id number] Date of Birth: [birth year]  Transition of Care [facility]) CM/SW Contact  Joaquin Courts, RN Phone Number: 04/27/2022, 10:31 AM  Clinical Narrative:     CM received notification from [facility] that the cpap machine has come in but the face mask is still pending.        Expected Discharge Plan and Services                                               Social Determinants of Health (SDOH) Interventions SDOH Screenings   Food Insecurity: No Food Insecurity (04/24/2022)  Housing: Low Risk  (04/24/2022)  Transportation Needs: No Transportation Needs (04/24/2022)  Utilities: Not At Risk (04/24/2022)  Depression (PHQ2-9): Low Risk  (05/16/2019)  Tobacco Use: Low Risk  (04/24/2022)    Readmission Risk Interventions     No data to display

## 2022-04-27 NOTE — TOC Transition Note (Signed)
Transition of Care [facility]) - CM/SW Discharge Note   Patient Details  Name: Barry [name] MRN: [id number] Date of Birth: [birth year]  Transition of Care (TOC) CM/SW Contact:  Joaquin Courts, RN Phone Number: 04/27/2022, 4:11 PM   Clinical Narrative:    DC summary faxed to [facility] via [place].  [facility] transport arranged.          Patient Goals and CMS Choice      Discharge Placement                         Discharge Plan and Services Additional resources added to the After Visit Summary for                                       Social Determinants of Health (SDOH) Interventions SDOH Screenings   Food Insecurity: No Food Insecurity (04/24/2022)  Housing: Low Risk  (04/24/2022)  Transportation Needs: No Transportation Needs (04/24/2022)  Utilities: Not At Risk (04/24/2022)  Depression (PHQ2-9): Low Risk  (05/16/2019)  Tobacco Use: Low Risk  (04/24/2022)     Readmission Risk Interventions     No data to display

## 2022-04-27 NOTE — Progress Notes (Signed)
Report called to facility

## 2022-04-28 LAB — GLUCOSE, CAPILLARY: Glucose-Capillary: 138 mg/dL — ABNORMAL HIGH (ref 70–99)

## 2022-04-28 NOTE — Progress Notes (Signed)
No charge progress note  Patient had been discharged yesterday but due to delay in [facility] transport, patient declined to go to SNF at night.  He was on my list of patients to be seen today.  However, before I got to see the patient, he was appropriately discharged by [facility] to SNF.   Vernell Leep, MD,  FACP, [place], [facility], [facility], [facility]   [facility] [place]     To contact the attending provider between 7A-7P or the covering provider during after hours 7P-7A, please log into the web site [email or web address] and access using universal [place] password for that web site. If you do not have the password, please call the hospital operator.

## 2022-04-28 NOTE — TOC Progression Note (Signed)
Transition of Care [facility]) - Progression Note    Patient Details  Name: Barry [name] MRN: [id number] Date of Birth: [birth year]  Transition of Care [facility]) CM/SW Contact  Loletha Grayer Beverely Pace, RN Phone Number: 04/28/2022, 9:29 AM  Clinical Narrative:    Case Manager has arranged for [facility] transport for patient. Will be next available. CM has updated bedside RN.,         Expected Discharge Plan and Services         Expected Discharge Date: 04/27/22                                     Social Determinants of Health (SDOH) Interventions SDOH Screenings   Food Insecurity: No Food Insecurity (04/24/2022)  Housing: Low Risk  (04/24/2022)  Transportation Needs: No Transportation Needs (04/24/2022)  Utilities: Not At Risk (04/24/2022)  Depression (PHQ2-9): Low Risk  (05/16/2019)  Tobacco Use: Low Risk  (04/24/2022)    Readmission Risk Interventions     No data to display

## 2022-04-29 NOTE — Progress Notes (Signed)
Sent message, via epic in basket, requesting orders in epic from surgeon.  

## 2022-05-04 ENCOUNTER — Ambulatory Visit: Payer: Self-pay | Admitting: Student

## 2022-05-04 DIAGNOSIS — Z794 Long term (current) use of insulin: Secondary | ICD-10-CM

## 2022-05-04 NOTE — Progress Notes (Addendum)
Anesthesia Review:  PCP: DR Marcello Moores- LOV 03/01/22 03/02/22 - clearance on chart  Cardiologist : Jeri Cos LOV 03/09/22 04/05/22- clearance Carlos Levering , NP 04/02/22 - clearance on chart  and in epic under telephone encounter dated 04/05/22.  Chest x-ray : 04/18/22- 1 view  EKG : 04/18/22  Echo : 04/05/22  Stress test: 04/09/22  Cardiac Cath :  2019  Activity level: can not do a flight of stairs without difficutly  Sleep Study/ CPAP : none  Fasting Blood Sugar :      / Checks Blood Sugar -- times a day:   Blood Thinner/ Instructions /Last Dose: ASA / Instructions/ Last Dose :    81 mg aspirin , Plavix- - pt unable to tell me    DM- type 2- glucose checked every am per pt  Hgba1c-  05/06/22 - 5.8    04/25/22- cbc and bmp in epic  Labs repeated on 05/06/22-  04/24/22- in ED  04/18/22- in ED with encephalopathy  04/27/22- fell per pt - no injury    PT has right eye prosthesis per pt    PT came ot preop on 05/06/2022 accompanied by noone.  [facility] brought him..  PT had paperwork with him from facilyt- [facility].  [phone] number for transportation . Currently resides at [facility] health care .   Upon arrival to preop appt pt in wheelchair.  PT could not tell me name of surgeon.  PT could tell me he was having surgery and right hip.  Pt could tell me year, month.  PT when asked other questions would have to be asked several times for answers or ramble about answers.  PT originally told preop nurse he lives with a friend.  Then later in conversation figured oute he lives at a facility.  PT had envelope in hand.  Ppaers were form [facility]. PT still did not know name of facility.  Pt clear in regards to some questoins and very unclear with other answers.  Shanda Bumps [facility] aware.  VSS.  Glucose 160.  PT discharged from preop appt back to [facility].  Warehouse manager for transportation.  PT was picked up.  PT was  sent boack with peanut butter crackers and diet gingeale.   Read over information from Medical Fac[facility].  Unclear if pt is on any blood thinners.  Called and spoke with nurse at facility and have requsted current [facility] from facility .  They are to fax.  Nurse was unsure if pt was taking any blood thinners.     CBC done 05/06/22 faxed to DR Brain Swinteck on 05/06/22.   PLTC- 45.   Leticia Clas aware.     Received and placed copy of MAR from [facility] on chart.   On 05/06/22  Called [facility] to speak with nurse to obtain fax number and to see if they had received any preop instructions regarding Plavix prior to surgery.  Phone rang multiple times on 05/06/22.  Will attempt to call again.   [facility] again and asked to speak with nurse taking pt of pt .  Call was transferred and rang multiople times on 05/06/22 with no answer.    Called [facility] on 05/10/22 to attempt to speak with nurse.     Preop nurse held on phone for several minutes.  No one came to phone.  Called again on 05/10/22 at 1630pm and spoke with nurse caring for pt.  Nurse verified pt was on Plavix currently at [facility]. Informed nurse that preop instructions for surgery on 05/19/22 would come from office of DR Swinteck since she stated [facility] had received no preop instructons in regards to Plavix.  Informed facility nurse that preop nurse would call DR Swinteck office and have them send over preop instructions in regards to Plavix..  Nurse at facility voiced understanding.  Also obtain fax number for facilty .  Called office of DR [employer] and spoke with Lynnea Ferrier and she stated pt was going to be cancelled and Kerri informed preop nurse that put is currently in ED.,

## 2022-05-04 NOTE — Patient Instructions (Signed)
SURGICAL WAITING ROOM VISITATION  Patients having surgery or a procedure may have no more than 2 support people in the waiting area - these visitors may rotate.    Children under the age of 22 must have an adult with them who is not the patient.  Due to an increase in RSV and influenza rates and associated hospitalizations, children ages 72 and under may not visit patients in [facility] hospitals.  If the patient needs to stay at the hospital during part of their recovery, the visitor guidelines for inpatient rooms apply. Pre-op nurse will coordinate an appropriate time for 1 support person to accompany patient in pre-op.  This support person may not rotate.    Please refer to the [facility] website for the visitor guidelines for Inpatients (after your surgery is over and you are in a regular room).       Your procedure is scheduled on:  05/19/22    Report to [facility] Main Entrance    Report to admitting at 1200 noon    Call this number if you have problems the morning of surgery [phone]   Do not eat food :After Midnight.   After Midnight you may have the following liquids until _ 1115_____ AM  DAY OF SURGERY  Water Non-Citrus Juices (without pulp, NO RED-Apple, White grape, White cranberry) Black Coffee (NO MILK/CREAM OR CREAMERS, sugar ok)  Clear Tea (NO MILK/CREAM OR CREAMERS, sugar ok) regular and decaf                             Plain Jell-O (NO RED)                                           Fruit ices (not with fruit pulp, NO RED)                                     Popsicles (NO RED)                                                               Sports drinks like Gatorade (NO RED)                The day of surgery:  Drink ONE (1) Pre-Surgery Clear Ensure or G2 at   1115 AM  ( have completed by )  the morning of surgery. Drink in one sitting. Do not sip.  This drink was given to you during your hospital  pre-op appointment visit. Nothing else to  drink after completing the  Pre-Surgery Clear Ensure or G2.          If you have questions, please contact your surgeon's office.      Oral Hygiene is also important to reduce your risk of infection.                                    Remember - BRUSH YOUR TEETH THE MORNING OF SURGERY WITH YOUR REGULAR  TOOTHPASTE  DENTURES WILL BE REMOVED PRIOR TO SURGERY PLEASE DO NOT APPLY "Poly grip" OR ADHESIVES!!!   Do NOT smoke after Midnight   Take these medicines the morning of surgery with A SIP OF WATER:   inaalers as usual and bring, buspasr, gabapentin, metoprolol, protonix, zoloft      DO NOT TAKE ANY ORAL DIABETIC MEDICATIONS DAY OF YOUR SURGERY  Bring CPAP mask and tubing day of surgery.                              You may not have any metal on your body including hair pins, jewelry, and body piercing             Do not wear make-up, lotions, powders, perfumes/cologne, or deodorant  Do not wear nail polish including gel and S&S, artificial/acrylic nails, or any other type of covering on natural nails including finger and toenails. If you have artificial nails, gel coating, etc. that needs to be removed by a nail salon please have this removed prior to surgery or surgery may need to be canceled/ delayed if the surgeon/ anesthesia feels like they are unable to be safely monitored.   Do not shave  48 hours prior to surgery.               Men may shave face and neck.   Do not bring valuables to the hospital. [facility] IS NOT             RESPONSIBLE   FOR VALUABLES.   Contacts, glasses, dentures or bridgework may not be worn into surgery.   Bring small overnight bag day of surgery.   DO NOT BRING YOUR HOME MEDICATIONS TO THE HOSPITAL. PHARMACY WILL DISPENSE MEDICATIONS LISTED ON YOUR MEDICATION LIST TO YOU DURING YOUR ADMISSION IN THE HOSPITAL!    Patients discharged on the day of surgery will not be allowed to drive home.  Someone NEEDS to stay with you for the first 24 hours  after anesthesia.   Special Instructions: Bring a copy of your healthcare power of attorney and living will documents the day of surgery if you haven't scanned them before.              Please read over the following fact sheets you were given: IF YOU HAVE QUESTIONS ABOUT YOUR PRE-OP INSTRUCTIONS PLEASE CALL [phone]   If you received a COVID test during your pre-op visit  it is requested that you wear a mask when out in public, stay away from anyone that may not be feeling well and notify your surgeon if you develop symptoms. If you test positive for Covid or have been in contact with anyone that has tested positive in the last 10 days please notify you surgeon.

## 2022-05-06 ENCOUNTER — Encounter (HOSPITAL_COMMUNITY)
Admission: RE | Admit: 2022-05-06 | Discharge: 2022-05-06 | Disposition: A | Discharge status: Home / Self Care | Payer: Medicare HMO | Source: Ambulatory Visit | Attending: Orthopedic Surgery | Admitting: Orthopedic Surgery

## 2022-05-06 ENCOUNTER — Encounter (HOSPITAL_COMMUNITY): Payer: Self-pay

## 2022-05-06 ENCOUNTER — Other Ambulatory Visit: Payer: Self-pay

## 2022-05-06 VITALS — BP 119/67 | HR 60 | Temp 97.8°F | Resp 16

## 2022-05-06 DIAGNOSIS — Z01818 Encounter for other preprocedural examination: Secondary | ICD-10-CM | POA: Diagnosis not present

## 2022-05-06 DIAGNOSIS — Z794 Long term (current) use of insulin: Secondary | ICD-10-CM | POA: Insufficient documentation

## 2022-05-06 DIAGNOSIS — E1140 Type 2 diabetes mellitus with diabetic neuropathy, unspecified: Secondary | ICD-10-CM | POA: Insufficient documentation

## 2022-05-06 HISTORY — DX: Depression, unspecified: F32.A

## 2022-05-06 HISTORY — DX: Sarcoidosis of skin: D86.3

## 2022-05-06 HISTORY — DX: Cerebral infarction, unspecified: I63.9

## 2022-05-06 HISTORY — DX: Polyneuropathy, unspecified: G62.9

## 2022-05-06 HISTORY — DX: Anemia, unspecified: D64.9

## 2022-05-06 HISTORY — DX: Anxiety disorder, unspecified: F41.9

## 2022-05-06 HISTORY — DX: Acute pancreatitis without necrosis or infection, unspecified: K85.90

## 2022-05-06 HISTORY — DX: Cerebrovascular disease, unspecified: I67.9

## 2022-05-06 HISTORY — DX: Dyspnea, unspecified: R06.00

## 2022-05-06 HISTORY — DX: Unspecified osteoarthritis, unspecified site: M19.90

## 2022-05-06 HISTORY — DX: Chronic kidney disease, unspecified: N18.9

## 2022-05-06 HISTORY — DX: Cervical disc disorder, unspecified, unspecified cervical region: M50.90

## 2022-05-06 HISTORY — DX: Unspecified cirrhosis of liver: K74.60

## 2022-05-06 HISTORY — DX: Headache, unspecified: R51.9

## 2022-05-06 HISTORY — DX: Hypocalcemia: E83.51

## 2022-05-06 HISTORY — DX: Esophageal varices without bleeding: I85.00

## 2022-05-06 LAB — BASIC METABOLIC PANEL
Anion gap: 5 (ref 5–15)
BUN: 18 mg/dL (ref 8–23)
CO2: 27 mmol/L (ref 22–32)
Calcium: 8.4 mg/dL — ABNORMAL LOW (ref 8.9–10.3)
Chloride: 104 mmol/L (ref 98–111)
Creatinine, Ser: 0.86 mg/dL (ref 0.61–1.24)
GFR, Estimated: >60 mL/min (ref >60)
Glucose, Bld: 156 mg/dL — ABNORMAL HIGH (ref 70–99)
Potassium: 3.9 mmol/L (ref 3.5–5.1)
Sodium: 136 mmol/L (ref 135–145)

## 2022-05-06 LAB — TYPE AND SCREEN
ABO/RH(D): A POS
Antibody Screen: NEGATIVE

## 2022-05-06 LAB — CBC
HCT: 34.5 % — ABNORMAL LOW (ref 39.0–52.0)
Hemoglobin: 11.1 g/dL — ABNORMAL LOW (ref 13.0–17.0)
MCH: 34.8 pg — ABNORMAL HIGH (ref 26.0–34.0)
MCHC: 32.2 g/dL (ref 30.0–36.0)
MCV: 108.2 fL — ABNORMAL HIGH (ref 80.0–100.0)
Platelets: 45 10*3/uL — ABNORMAL LOW (ref 150–400)
RBC: 3.19 MIL/uL — ABNORMAL LOW (ref 4.22–5.81)
RDW: 16.8 % — ABNORMAL HIGH (ref 11.5–15.5)
WBC: 4.7 10*3/uL (ref 4.0–10.5)
nRBC: 0 % (ref 0.0–0.2)

## 2022-05-06 LAB — SURGICAL PCR SCREEN
MRSA, PCR: NEGATIVE
Staphylococcus aureus: POSITIVE — AB

## 2022-05-06 LAB — HEMOGLOBIN A1C
Hgb A1c MFr Bld: 5.8 % — ABNORMAL HIGH (ref 4.8–5.6)
Mean Plasma Glucose: 119.76 mg/dL

## 2022-05-06 LAB — GLUCOSE, CAPILLARY: Glucose-Capillary: 160 mg/dL — ABNORMAL HIGH (ref 70–99)

## 2022-05-07 ENCOUNTER — Encounter (HOSPITAL_COMMUNITY): Payer: Self-pay | Admitting: Physician Assistant

## 2022-05-07 NOTE — Progress Notes (Signed)
Anesthesia chart review   Case: [id number] Date/Time: 05/19/22 1411   Procedure: TOTAL HIP ARTHROPLASTY ANTERIOR APPROACH (Right: Hip) - 140   Anesthesia type: Spinal   Pre-op diagnosis: Right hip osteoarthritis   Location: [facility] ROOM 07 / [facility]   Surgeons: Samson Frederic, MD       DISCUSSION: 67 year old never smoker with history of HTN, GERD, stroke, CKD, DM II, sleep apnea, CAD, liver cirrhosis, chronic thrombocytopenia, right hip OA scheduled for above procedure 05/19/2022 Dr. Samson Frederic.   Patient with recent hospital admission 04/18/2022 - 04/24/2022 with acute metabolic encephalopathy.  Patient presented to ED with AMS.  Unclear etiology, differentials included dehydration.  During admission hepatitis panel with reactive hep B.  Patient discharged to nursing facility.  PT nurse notes patient is mildly confused at PAT visit.   It does not appear patient has had PCP follow-up since hospitalization.  Will need PCP hospital follow up and for clearance prior to procedure.  Case is currently booked with spinal, discussed pt's chronic thrombocytopenia with Dr. Kathline Magic office.   Prior to this hospital admission patient cardiology commented on preoperative evaluation.  Per her note, "Chart reviewed as part of pre-operative protocol coverage.  Johnaven Trites Magill was last seen on 03/09/2022 by Dr. Allyson Sabal.  He underwent normal echo and stress testing which was found to be low risk.  Per Dr. Allyson Sabal: "Low risk nonischemic Myoview.  Cleared for hip surgery at low risk." Therefore, based on ACC/AHA guidelines, the patient would be at acceptable risk for the planned procedure without further cardiovascular testing.  Per office protocol, he may hold Plavix for 5 days prior to procedure and should resume as soon as hemodynamically stable postoperatively."   VS: BP 119/67   Pulse 60   Temp 36.6 C (Oral)   Resp 16   SpO2 100%   PROVIDERS: [name], MD is PCP   LABS:  forwarded to PCP and Dr.  Linna Caprice (all labs ordered are listed, but only abnormal results are displayed)  Labs Reviewed  SURGICAL PCR SCREEN - Abnormal; Notable for the following components:      Result Value   Staphylococcus aureus POSITIVE (*)    All other components within normal limits  BASIC METABOLIC PANEL - Abnormal; Notable for the following components:   Glucose, Bld 156 (*)    Calcium 8.4 (*)    All other components within normal limits  CBC - Abnormal; Notable for the following components:   RBC 3.19 (*)    Hemoglobin 11.1 (*)    HCT 34.5 (*)    MCV 108.2 (*)    MCH 34.8 (*)    RDW 16.8 (*)    Platelets 45 (*)    All other components within normal limits  HEMOGLOBIN A1C - Abnormal; Notable for the following components:   Hgb A1c MFr Bld 5.8 (*)    All other components within normal limits  GLUCOSE, CAPILLARY - Abnormal; Notable for the following components:   Glucose-Capillary 160 (*)    All other components within normal limits  TYPE AND SCREEN     IMAGES:   EKG:   CV: Myocardial perfusion 04/09/2022   The study is normal. The study is low risk.   No ST deviation was noted.   LV perfusion is normal. There is no evidence of ischemia. There is no evidence of infarction.   Left ventricular function is normal. End diastolic cavity size is normal. End systolic cavity size is normal.   Prior study  available for comparison from 04/01/2017.   Normal stress nuclear study with no ischemia or infarction; EF 62 with normal wall motion.  Echo 04/09/2022 1. Left ventricular ejection fraction, by estimation, is >75%. The left  ventricle has hyperdynamic function. The left ventricle has no regional  wall motion abnormalities. There is mild asymmetric left ventricular  hypertrophy of the basal-septal segment.   Indeterminate diastolic filling due to E-A fusion.   2. Right ventricular systolic function is normal. The right ventricular  size is normal. There is normal pulmonary artery systolic  pressure. The  estimated right ventricular systolic pressure is 33.5 mmHg.   3. No evidence of mitral valve regurgitation.   4. Aortic valve regurgitation is not visualized.   5. The inferior vena cava is normal in size with greater than 50%  respiratory variability, suggesting right atrial pressure of 3 mmHg.  Past Medical History:  Diagnosis Date   Anemia    Anxiety    Arthritis    Cerebrovascular disease    Cervical disc disorder    Chronic kidney disease    Cirrhosis of liver    Coronary artery disease    Depression    Dyspnea    with exertion   Esophageal varices    hx of   Family history of colon cancer 10/15/2019   GERD (gastroesophageal reflux disease)    Headache    Hyperlipidemia    Hyperlipidemia    Hypocalcemia    MI (myocardial infarction) 04/23/2007   inferior wall   Morbid obesity 06/26/2012   Neuropathy    secondary to diabetes   Pancreatitis    hs of   S/P CABG x 3 07/04/2012   LIMA to LAD, SVG to D1, SVG to PDA, EVH via right thigh   Sarcoidosis of skin    Sleep apnea    no cpap   Stroke    small stroke - 2023 - legs weak , righ tleg weak   Type II or unspecified type diabetes mellitus without mention of complication, not stated as uncontrolled    Unspecified essential hypertension     Past Surgical History:  Procedure Laterality Date   CORONARY ANGIOPLASTY WITH STENT PLACEMENT  04/23/2007   PCI and stenting of mid RCA - Dr Bary Castilla @ [facility]   CORONARY ARTERY BYPASS GRAFT N/A 07/04/2012   Procedure: CORONARY ARTERY BYPASS GRAFTING (CABG);  Surgeon: [name], MD;  Location: [name] OR;  Service: Open Heart Surgery;  Laterality: N/A;  x3 using right greater saphenous vein and left internal mammary.    INTRAOPERATIVE TRANSESOPHAGEAL ECHOCARDIOGRAM N/A 07/04/2012   Procedure: INTRAOPERATIVE TRANSESOPHAGEAL ECHOCARDIOGRAM;  Surgeon: [name], MD;  Location: [facility] OR;  Service: Open Heart Surgery;  Laterality: N/A;   LEFT HEART CATH AND CORS/GRAFTS  ANGIOGRAPHY N/A 04/18/2017   Procedure: LEFT HEART CATH AND CORS/GRAFTS ANGIOGRAPHY;  Surgeon: [name], MD;  Location: [facility] INVASIVE CV LAB;  Service: Cardiovascular;  Laterality: N/A;   LEFT HEART CATHETERIZATION WITH CORONARY ANGIOGRAM N/A 06/25/2012   Procedure: LEFT HEART CATHETERIZATION WITH CORONARY ANGIOGRAM;  Surgeon: [name], MD;  Location: [facility] CATH LAB;  Service: Cardiovascular;  Laterality: N/A;   TOOTH EXTRACTION  04/2018   4 teeth pulled     MEDICATIONS:  acetaminophen (TYLENOL) 500 MG tablet   albuterol (PROVENTIL HFA;VENTOLIN HFA) 108 (90 Base) MCG/ACT inhaler   aspirin 81 MG chewable tablet   [START ON 05/08/2022] atorvastatin (LIPITOR) 80 MG tablet   Blood Glucose Monitoring Suppl (CONTOUR NEXT  EZ MONITOR) w/Device KIT   busPIRone (BUSPAR) 15 MG tablet   clopidogrel (PLAVIX) 75 MG tablet   cyclobenzaprine (FLEXERIL) 10 MG tablet   FEROSUL 325 (65 Fe) MG tablet   fluticasone-salmeterol (ADVAIR) 100-50 MCG/ACT AEPB   gabapentin (NEURONTIN) 300 MG capsule   lactulose (CHRONULAC) 10 GM/15ML solution   metFORMIN (GLUCOPHAGE) 1000 MG tablet   metoprolol tartrate (LOPRESSOR) 25 MG tablet   oxyCODONE-acetaminophen (PERCOCET) 10-325 MG tablet   pantoprazole (PROTONIX) 40 MG tablet   sertraline (ZOLOFT) 100 MG tablet   thiamine (VITAMIN B-1) 100 MG tablet   No current facility-administered medications for this encounter.    Jodell Cipro Ward, PA-C [facility] Pre-Surgical Testing [phone]

## 2022-05-10 ENCOUNTER — Emergency Department (HOSPITAL_COMMUNITY): Payer: Medicare HMO

## 2022-05-10 ENCOUNTER — Emergency Department (HOSPITAL_COMMUNITY)
Admission: EM | Admit: 2022-05-10 | Discharge: 2022-05-11 | Disposition: A | Discharge status: Home / Self Care | Payer: Medicare HMO | Attending: Emergency Medicine | Admitting: Emergency Medicine

## 2022-05-10 DIAGNOSIS — E1122 Type 2 diabetes mellitus with diabetic chronic kidney disease: Secondary | ICD-10-CM | POA: Insufficient documentation

## 2022-05-10 DIAGNOSIS — R410 Disorientation, unspecified: Secondary | ICD-10-CM | POA: Diagnosis not present

## 2022-05-10 DIAGNOSIS — E519 Thiamine deficiency, unspecified: Secondary | ICD-10-CM | POA: Diagnosis not present

## 2022-05-10 DIAGNOSIS — Z7982 Long term (current) use of aspirin: Secondary | ICD-10-CM | POA: Diagnosis not present

## 2022-05-10 DIAGNOSIS — R140 Abdominal distension (gaseous): Secondary | ICD-10-CM | POA: Insufficient documentation

## 2022-05-10 DIAGNOSIS — M25551 Pain in right hip: Secondary | ICD-10-CM | POA: Insufficient documentation

## 2022-05-10 DIAGNOSIS — N189 Chronic kidney disease, unspecified: Secondary | ICD-10-CM | POA: Insufficient documentation

## 2022-05-10 DIAGNOSIS — Z7901 Long term (current) use of anticoagulants: Secondary | ICD-10-CM | POA: Insufficient documentation

## 2022-05-10 DIAGNOSIS — Z1152 Encounter for screening for COVID-19: Secondary | ICD-10-CM | POA: Insufficient documentation

## 2022-05-10 DIAGNOSIS — R682 Dry mouth, unspecified: Secondary | ICD-10-CM | POA: Diagnosis not present

## 2022-05-10 DIAGNOSIS — R4182 Altered mental status, unspecified: Secondary | ICD-10-CM | POA: Diagnosis present

## 2022-05-10 DIAGNOSIS — D539 Nutritional anemia, unspecified: Secondary | ICD-10-CM | POA: Diagnosis not present

## 2022-05-10 DIAGNOSIS — Z7984 Long term (current) use of oral hypoglycemic drugs: Secondary | ICD-10-CM | POA: Insufficient documentation

## 2022-05-10 LAB — RAPID URINE DRUG SCREEN, HOSP PERFORMED
Amphetamines: NOT DETECTED
Barbiturates: NOT DETECTED
Benzodiazepines: NOT DETECTED
Cocaine: NOT DETECTED
Opiates: NOT DETECTED
Tetrahydrocannabinol: NOT DETECTED

## 2022-05-10 LAB — COMPREHENSIVE METABOLIC PANEL
ALT: 50 U/L — ABNORMAL HIGH (ref 0–44)
AST: 76 U/L — ABNORMAL HIGH (ref 15–41)
Albumin: 2.8 g/dL — ABNORMAL LOW (ref 3.5–5.0)
Alkaline Phosphatase: 113 U/L (ref 38–126)
Anion gap: 13 (ref 5–15)
BUN: 24 mg/dL — ABNORMAL HIGH (ref 8–23)
CO2: 19 mmol/L — ABNORMAL LOW (ref 22–32)
Calcium: 8 mg/dL — ABNORMAL LOW (ref 8.9–10.3)
Chloride: 104 mmol/L (ref 98–111)
Creatinine, Ser: 0.98 mg/dL (ref 0.61–1.24)
GFR, Estimated: >60 mL/min (ref >60)
Glucose, Bld: 72 mg/dL (ref 70–99)
Potassium: 4.6 mmol/L (ref 3.5–5.1)
Sodium: 136 mmol/L (ref 135–145)
Total Bilirubin: 2.2 mg/dL — ABNORMAL HIGH (ref 0.3–1.2)
Total Protein: 6.9 g/dL (ref 6.5–8.1)

## 2022-05-10 LAB — CBC WITH DIFFERENTIAL/PLATELET
Abs Immature Granulocytes: 0.01 10*3/uL (ref 0.00–0.07)
Basophils Absolute: 0 10*3/uL (ref 0.0–0.1)
Basophils Relative: 1 %
Eosinophils Absolute: 0.1 10*3/uL (ref 0.0–0.5)
Eosinophils Relative: 1 %
HCT: 28.7 % — ABNORMAL LOW (ref 39.0–52.0)
Hemoglobin: 9.3 g/dL — ABNORMAL LOW (ref 13.0–17.0)
Immature Granulocytes: 0 %
Lymphocytes Relative: 17 %
Lymphs Abs: 1 10*3/uL (ref 0.7–4.0)
MCH: 35 pg — ABNORMAL HIGH (ref 26.0–34.0)
MCHC: 32.4 g/dL (ref 30.0–36.0)
MCV: 107.9 fL — ABNORMAL HIGH (ref 80.0–100.0)
Monocytes Absolute: 0.8 10*3/uL (ref 0.1–1.0)
Monocytes Relative: 14 %
Neutro Abs: 3.9 10*3/uL (ref 1.7–7.7)
Neutrophils Relative %: 67 %
Platelets: 56 10*3/uL — ABNORMAL LOW (ref 150–400)
RBC: 2.66 MIL/uL — ABNORMAL LOW (ref 4.22–5.81)
RDW: 16.9 % — ABNORMAL HIGH (ref 11.5–15.5)
WBC: 5.8 10*3/uL (ref 4.0–10.5)
nRBC: 0 % (ref 0.0–0.2)

## 2022-05-10 LAB — URINALYSIS, ROUTINE W REFLEX MICROSCOPIC
Bacteria, UA: NONE SEEN
Bilirubin Urine: NEGATIVE
Glucose, UA: NEGATIVE mg/dL
Hgb urine dipstick: NEGATIVE
Ketones, ur: 80 mg/dL — AB
Nitrite: NEGATIVE
Protein, ur: NEGATIVE mg/dL
Specific Gravity, Urine: 1.023 (ref 1.005–1.030)
pH: 5 (ref 5.0–8.0)

## 2022-05-10 LAB — BLOOD GAS, VENOUS
Acid-base deficit: 5.1 mmol/L — ABNORMAL HIGH (ref 0.0–2.0)
Bicarbonate: 19.9 mmol/L — ABNORMAL LOW (ref 20.0–28.0)
O2 Saturation: 64.3 %
Patient temperature: 37
pCO2, Ven: 36 mmHg — ABNORMAL LOW (ref 44–60)
pH, Ven: 7.35 (ref 7.25–7.43)
pO2, Ven: 33 mmHg (ref 32–45)

## 2022-05-10 LAB — AMMONIA: Ammonia: 34 umol/L (ref 9–35)

## 2022-05-10 LAB — SARS CORONAVIRUS 2 BY RT PCR: SARS Coronavirus 2 by RT PCR: NEGATIVE

## 2022-05-10 LAB — CBG MONITORING, ED: Glucose-Capillary: 70 mg/dL (ref 70–99)

## 2022-05-10 MED ORDER — LACTATED RINGERS IV BOLUS
1000.0000 mL | Freq: Once | INTRAVENOUS | Status: AC
  Administered 2022-05-10: 1000 mL via INTRAVENOUS

## 2022-05-10 MED ORDER — OXYCODONE-ACETAMINOPHEN 5-325 MG PO TABS
2.0000 | ORAL_TABLET | Freq: Once | ORAL | Status: AC
  Administered 2022-05-10: 2 via ORAL
  Filled 2022-05-10: qty 2

## 2022-05-10 MED ORDER — SODIUM CHLORIDE 0.9 % IV BOLUS
1000.0000 mL | Freq: Once | INTRAVENOUS | Status: AC
  Administered 2022-05-10: 1000 mL via INTRAVENOUS

## 2022-05-10 NOTE — ED Notes (Signed)
[facility] called for transport for pt to return [facility]

## 2022-05-10 NOTE — Discharge Instructions (Addendum)
Thank you for allowing me to be a part of your care today.   Your workup was reassuring, but you continue to have anemia.  Be sure you are taking your vitamin B1 as prescribed.  I recommend having your blood counts rechecked.    Return to the ED if you experience sudden worsening of your symptoms or if you have any new concerns.

## 2022-05-10 NOTE — ED Triage Notes (Signed)
Pt BIBA from [facility]. NP did rounds and found pt to be more lethargic than normal. A&Ox4. Pt states he has R hip pain and recently had right hip replacement and was SOB. Pt states he had MI in March. Non-productive cough x1week. Falling a lot in nursing home per staff. No falls in past 2 days.  BP 124/58 RR 16 97% RA CBG 80 T 98

## 2022-05-10 NOTE — ED Provider Notes (Signed)
Accepted handoff at shift change from Mertha Baars, PA-C. Please see prior provider note for more detail.   Briefly: Patient is 67 y.o. brought to ED from SNF due to altered mental status.  NP performing rounds felt that the patient was not mentating at his baseline and that he seemed lethargic and wanted him evaluated in the ED.    DDX: concern for encephalopathy, uremia, metabolic derangement  Plan: If patient work up is negative for acute findings, he is appropriate for discharge back to facility.  Otherwise, will treat accordingly.      Results for orders placed or performed during the hospital encounter of 05/10/22  SARS Coronavirus 2 by RT PCR (hospital order, performed in [facility] hospital lab) *cepheid single result test* Anterior Nasal Swab   Specimen: Anterior Nasal Swab  Result Value Ref Range   SARS Coronavirus 2 by RT PCR NEGATIVE NEGATIVE  CBG monitoring, ED  Result Value Ref Range   Glucose-Capillary 70 70 - 99 mg/dL   CT Head Wo Contrast  Result Date: 05/10/2022 CLINICAL DATA:  Mental status change of unknown cause. EXAM: CT HEAD WITHOUT CONTRAST TECHNIQUE: Contiguous axial images were obtained from the base of the skull through the vertex without intravenous contrast. RADIATION DOSE REDUCTION: This exam was performed according to the departmental dose-optimization program which includes automated exposure control, adjustment of the mA and/or kV according to patient size and/or use of iterative reconstruction technique. COMPARISON:  04/18/2022 FINDINGS: Brain: No evidence of acute infarction, hemorrhage, hydrocephalus, extra-axial collection or mass lesion/mass effect. Vascular: No hyperdense vessel or unexpected calcification. Skull: Normal. Negative for fracture or focal lesion. Sinuses/Orbits: Stable prosthetic right globe. Left globe and orbit are unremarkable. Significant sinus disease. Moderate mucosal thickening with dependent fluid noted in the maxillary sinuses.  Moderate to marked mucosal thickening noted throughout the ethmoid air cells, many of which are opacified. Small sphenoid sinuses with mucosal thickening, left with dependent fluid. Dependent fluid in the left frontal sinus. Other: None. IMPRESSION: 1. No acute intracranial abnormalities. 2. Significant sinus disease as detailed including fluid levels. Consider acute sinusitis in the proper clinical setting. Electronically Signed   By: Amie Portland M.D.   On: 05/10/2022 15:10   DG Chest Port 1 View  Result Date: 05/10/2022 CLINICAL DATA:  Altered mental status EXAM: PORTABLE CHEST - 1 VIEW COMPARISON:  04/18/2022 FINDINGS: Patchy interstitial opacities in the lung bases slightly improved since previous. Heart size upper limits normal. CABG markers. Aortic Atherosclerosis (ICD10-170.0). Chronic blunting of left lateral costophrenic angle. Sternotomy wires. IMPRESSION: Slight improvement in bibasilar interstitial opacities. Electronically Signed   By: Corlis Leak M.D.   On: 05/10/2022 14:37   CT Head Wo Contrast  Result Date: 04/18/2022 CLINICAL DATA:  Mental status changes, unknown cause. EXAM: CT HEAD WITHOUT CONTRAST TECHNIQUE: Contiguous axial images were obtained from the base of the skull through the vertex without intravenous contrast. RADIATION DOSE REDUCTION: This exam was performed according to the departmental dose-optimization program which includes automated exposure control, adjustment of the mA and/or kV according to patient size and/or use of iterative reconstruction technique. COMPARISON:  Prior head CTs are unavailable in PACS at this time. Comparison is made with report of the most recent head CTs dated 04/01/2020 and 04/06/2020 FINDINGS: Brain: Some images are motion limited with loss of fine detail. There is mild cerebral atrophy and small-vessel disease. The ventricles are normal in size and position. Cerebellum and brainstem are unremarkable. No old territorial infarct is seen. Allowing  for  motion no acute cortical based infarct, hemorrhage, or mass effect is evident. There is no midline shift. The basal cisterns are clear. Vascular: There are patchy calcifications of the carotid siphons. No hyperdense central vessel is seen. Skull: Negative for fractures or focal lesions. Sinuses/Orbits: No acute orbital findings. Old left lens replacement. A right globe prosthesis. Clear sinuses and mastoid air cells. Other: None. IMPRESSION: No acute intracranial CT findings. Mild atrophy and small-vessel disease. Carotid atherosclerosis. Electronically Signed   By: Almira Bar M.D.   On: 04/18/2022 20:21   DG Chest Port 1 View  Result Date: 04/18/2022 CLINICAL DATA:  Sepsis. Weak and lethargic. Dysuria and dark urine for 3 days. EXAM: PORTABLE CHEST 1 VIEW COMPARISON:  12/30/2021 FINDINGS: Postoperative changes in the mediastinum. Shallow inspiration. Heart size and pulmonary vascularity are normal for technique. Probable small bilateral pleural effusions with basilar atelectasis. No pneumothorax. Mediastinal contours appear intact. IMPRESSION: Shallow inspiration. Probable small pleural effusions with basilar atelectasis. Electronically Signed   By: Burman Nieves M.D.   On: 04/18/2022 19:32     Physical Exam  BP (!) 116/55 (BP Location: Right Arm)   Pulse 82   Temp (!) 97.5 F (36.4 C) (Oral)   Resp 18   SpO2 96%   Physical Exam Vitals and nursing note reviewed.  Constitutional:      General: He is not in acute distress.    Appearance: He is not ill-appearing.  HENT:     Mouth/Throat:     Mouth: Mucous membranes are moist.     Pharynx: Oropharynx is clear.  Cardiovascular:     Rate and Rhythm: Normal rate and regular rhythm.     Pulses: Normal pulses.     Heart sounds: Normal heart sounds.  Pulmonary:     Effort: Pulmonary effort is normal. No respiratory distress.     Breath sounds: Normal breath sounds and air entry.  Abdominal:     General: Abdomen is flat. Bowel sounds  are normal. There is no distension.     Palpations: Abdomen is soft.     Tenderness: There is no abdominal tenderness.  Musculoskeletal:     Comments: Tenderness to palpation over right hip.  Skin:    General: Skin is warm and dry.     Capillary Refill: Capillary refill takes less than 2 seconds.  Neurological:     General: No focal deficit present.     Mental Status: He is alert and oriented to person, place, and time. Mental status is at baseline.     GCS: GCS eye subscore is 4. GCS verbal subscore is 5. GCS motor subscore is 6.     Sensory: Sensation is intact.     Motor: Weakness (right leg, not acute) present. No tremor, atrophy or abnormal muscle tone.     Coordination: Coordination is intact.     Comments: Patient has difficulty lifting right leg off of bed, however, this is chronic and due to his ongoing right hip problem.  5/5 strength in all other extremities.  He is able to answer all questions appropriately, stays awake during conversation, and follows commands.    Psychiatric:        Mood and Affect: Mood normal.        Behavior: Behavior normal.     Procedures  Procedures  ED Course / MDM    Medical Decision Making Amount and/or Complexity of Data Reviewed Labs: ordered. Radiology: ordered.  Risk Prescription drug management.   Patient presents to ED  by EMS due to concern for lethargy and altered mental status.  Patient's work up is overall reassuring and there is low suspicion for encephalopathy.  Patient is awake, alert, and able to answer all questions.  When asked if he knows why he is in the ED, patient states "My insurance benefits ran out and I have no where else to go. I have no family, no friends, no where to go".    Attempted to speak to provider or personnel at [facility] that is familiar with patient/directly cares for patient, but was unsuccessful.  Patient was given IV fluids.  He was also allowed to eat and drink, which he did  without issues.    Patient UDS negative.  He has chronic macrocytic anemia, appears to be down from prior values.  He has known B1 deficiency that he is prescribed supplementation for.  Patient denies melena, hematochezia, and hematemesis.  Patient's previous labs demonstrate normal iron, b12, and folate.    Patient continues to be awake and alert, able to answer questions.  I feel that he is appropriate for discharge back to facility at this time.   The patient has been appropriately medically screened and/or stabilized in the ED. I have low suspicion for any other emergent medical condition which would require further screening, evaluation or treatment in the ED or require inpatient management. At time of discharge the patient is hemodynamically stable and in no acute distress. I have discussed work-up results and diagnosis with patient and answered all questions. Patient is agreeable with discharge plan. We discussed strict return precautions for returning to the emergency department and they verbalized understanding.       Lenard Simmer, PA-C 05/10/22 2045    Wynetta Fines, MD 05/15/22 [phone]

## 2022-05-10 NOTE — ED Provider Notes (Signed)
[place] EMERGENCY DEPARTMENT AT [facility] Provider Note   CSN: [id number] Arrival date & time: 05/10/22  1351     History  Chief Complaint  Patient presents with   Fatigue    Barry [name] is a 67 y.o. male. With past medical history of obesity, OSA, T2DM, cirrhosis, MGUS, CKD who presents to the emergency department with altered mental status.  Level 5 Caveat: AMS   I spoke with RN at [facility] [place], [place]. She states that this is her first day caring for the patient. However, states he was complaining of slight headache and felt congestion. She states he did eat this morning and finished his medications. About 1 hour after this, the NP came to round on the patient and felt he was different from baseline. Stated he seemed lethargic and wanted him to be evaluated. He does have history of frequent falls and is on Plavix. Also takes oxycodone PRN.   HPI     Home Medications Prior to Admission medications   Medication Sig Start Date End Date Taking? Authorizing Provider  acetaminophen (TYLENOL) 500 MG tablet Take 1 tablet (500 mg total) by mouth every 8 (eight) hours as needed for moderate pain. 04/24/22   [name], MD  albuterol (PROVENTIL HFA;VENTOLIN HFA) [phone] Base) MCG/ACT inhaler Inhale 2 puffs into the lungs every 6 (six) hours as needed for wheezing or shortness of breath. 04/21/17   Kirt Boys, DO  aspirin 81 MG chewable tablet Chew 81 mg by mouth every evening.    [provider]  atorvastatin (LIPITOR) 80 MG tablet Take 1 tablet (80 mg total) by mouth daily. 05/08/22   [name], MD  Blood Glucose Monitoring Suppl (CONTOUR NEXT EZ MONITOR) w/Device KIT Test blood sugar three times daily E11.22 03/08/16   Edison Pace, RPH-CPP  busPIRone (BUSPAR) 15 MG tablet TAKE 1 TABLET BY MOUTH THREE TIMES DAILY FOR ANXIETY 07/19/17   Kirt Boys, DO  clopidogrel (PLAVIX) 75 MG tablet APPOINTMENT OVERDUE Take 1 by mouth  daily Patient taking differently: Take 75 mg by mouth daily. 10/31/19   Sharon Seller, NP  cyclobenzaprine (FLEXERIL) 10 MG tablet Take 10 mg by mouth at bedtime. May take an additional 10 mg up to twice daily as needed for pain related to RIGHT HIP, DO NOT GIVE WITHIN 6 HOURS OF SCHEDULED DOSE 07/23/19   [provider]  FEROSUL 325 (65 Fe) MG tablet Take 325 mg by mouth daily. 10/13/21   [provider]  fluticasone-salmeterol (ADVAIR) 100-50 MCG/ACT AEPB Inhale 1 puff into the lungs 2 (two) times daily.    [provider]  gabapentin (NEURONTIN) 300 MG capsule Take 300 mg by mouth in the morning and at bedtime. 09/12/19   [provider]  lactulose (CHRONULAC) 10 GM/15ML solution Take 15 mLs (10 g total) by mouth 3 (three) times daily. 04/24/22   [name], MD  metFORMIN (GLUCOPHAGE) 1000 MG tablet TAKE 1 TABLET(1000 MG) BY MOUTH TWICE DAILY Patient taking differently: Take 1,000 mg by mouth 2 (two) times daily with a meal. 09/09/17   Kirt Boys, DO  metoprolol tartrate (LOPRESSOR) 25 MG tablet Take 0.5 tablets (12.5 mg total) by mouth 2 (two) times daily. 05/18/17   Kirt Boys, DO  oxyCODONE-acetaminophen (PERCOCET) 10-325 MG tablet Take 1 tablet by mouth every 8 (eight) hours as needed for pain. Patient taking differently: Take 1 tablet by mouth 3 (three) times daily. 04/24/22   Janee Morn,  Lovey Newcomer, MD  pantoprazole (PROTONIX) 40 MG tablet TAKE 1 TABLET BY MOUTH EVERY DAY 04/12/22   Josph Macho, MD  sertraline (ZOLOFT) 100 MG tablet TAKE 2 TABLETS(200 MG) BY MOUTH DAILY 04/20/19   Sharon Seller, NP  thiamine (VITAMIN B-1) 100 MG tablet Take 1 tablet (100 mg total) by mouth daily. 03/06/22   [name], MD      Allergies    Patient has no known allergies.    Review of Systems   Review of Systems  Unable to perform ROS: Mental status change    Physical Exam Updated Vital Signs BP (!) 116/55 (BP Location: Right Arm)   Pulse 82    Temp (!) 97.5 F (36.4 C) (Oral)   Resp 18   SpO2 96%  Physical Exam Vitals and nursing note reviewed.  Constitutional:      General: He is not in acute distress.    Appearance: He is obese. He is ill-appearing.  HENT:     Head: Normocephalic.     Mouth/Throat:     Mouth: Mucous membranes are dry.     Pharynx: Oropharynx is clear.  Eyes:     General: No scleral icterus.    Extraocular Movements: Extraocular movements intact.  Cardiovascular:     Rate and Rhythm: Normal rate and regular rhythm.     Pulses: Normal pulses.     Heart sounds: No murmur heard. Pulmonary:     Effort: Pulmonary effort is normal. No respiratory distress.     Breath sounds: Normal breath sounds.  Abdominal:     General: Bowel sounds are normal. There is distension.     Palpations: Abdomen is soft.     Tenderness: There is no abdominal tenderness.  Musculoskeletal:        General: Tenderness present.     Cervical back: Neck supple.     Comments: Right hip TTP. No deformity or erythema or swelling   Skin:    General: Skin is warm and dry.     Capillary Refill: Capillary refill takes less than 2 seconds.  Neurological:     General: No focal deficit present.     Mental Status: He is alert. He is disoriented and confused.     GCS: GCS eye subscore is 4. GCS verbal subscore is 4. GCS motor subscore is 6.     Cranial Nerves: Cranial nerves 2-12 are intact.     Comments: Globally weak  Disoriented to place, time      ED Results / Procedures / Treatments   Labs (all labs ordered are listed, but only abnormal results are displayed) Labs Reviewed  SARS CORONAVIRUS 2 BY RT PCR  COMPREHENSIVE METABOLIC PANEL  CBC WITH DIFFERENTIAL/PLATELET  URINALYSIS, ROUTINE W REFLEX MICROSCOPIC  BLOOD GAS, VENOUS  AMMONIA  CBG MONITORING, ED  CBG MONITORING, ED    EKG None  Radiology CT Head Wo Contrast  Result Date: 05/10/2022 CLINICAL DATA:  Mental status change of unknown cause. EXAM: CT HEAD  WITHOUT CONTRAST TECHNIQUE: Contiguous axial images were obtained from the base of the skull through the vertex without intravenous contrast. RADIATION DOSE REDUCTION: This exam was performed according to the departmental dose-optimization program which includes automated exposure control, adjustment of the mA and/or kV according to patient size and/or use of iterative reconstruction technique. COMPARISON:  04/18/2022 FINDINGS: Brain: No evidence of acute infarction, hemorrhage, hydrocephalus, extra-axial collection or mass lesion/mass effect. Vascular: No hyperdense vessel or unexpected calcification. Skull: Normal. Negative for  fracture or focal lesion. Sinuses/Orbits: Stable prosthetic right globe. Left globe and orbit are unremarkable. Significant sinus disease. Moderate mucosal thickening with dependent fluid noted in the maxillary sinuses. Moderate to marked mucosal thickening noted throughout the ethmoid air cells, many of which are opacified. Small sphenoid sinuses with mucosal thickening, left with dependent fluid. Dependent fluid in the left frontal sinus. Other: None. IMPRESSION: 1. No acute intracranial abnormalities. 2. Significant sinus disease as detailed including fluid levels. Consider acute sinusitis in the proper clinical setting. Electronically Signed   By: Amie Portland M.D.   On: 05/10/2022 15:10   DG Chest Port 1 View  Result Date: 05/10/2022 CLINICAL DATA:  Altered mental status EXAM: PORTABLE CHEST - 1 VIEW COMPARISON:  04/18/2022 FINDINGS: Patchy interstitial opacities in the lung bases slightly improved since previous. Heart size upper limits normal. CABG markers. Aortic Atherosclerosis (ICD10-170.0). Chronic blunting of left lateral costophrenic angle. Sternotomy wires. IMPRESSION: Slight improvement in bibasilar interstitial opacities. Electronically Signed   By: Corlis Leak M.D.   On: 05/10/2022 14:37    Procedures Procedures   Medications Ordered in ED Medications - No data to  display  ED Course/ Medical Decision Making/ A&P  Medical Decision Making Amount and/or Complexity of Data Reviewed Labs: ordered. Radiology: ordered.  Care of patient handed off to [facility], PA-C at change of shift.  Patient here with altered mental status/fatigue from SNF.  He is disoriented here. Appears dry as well.  Obtaining AMS workup including VBG, ammonia, infection workup. He had CT head and CXR which were negative. If no findings on workup, likely okay to go back to facility. Otherwise treat per lab results.  Please see her note for completion of care.  Final Clinical Impression(s) / ED Diagnoses Final diagnoses:  None    Rx / DC Orders ED Discharge Orders     None         Cristopher Peru, PA-C 05/10/22 1536    Wynetta Fines, MD 05/11/22 0700

## 2022-05-10 NOTE — ED Notes (Signed)
Pt resting, sitting up in chair on cell phone and watching TV, NAD noted, observed even RR and unlabored, pt expresses no needs or concerns at this time, call light within reach, no further concerns as of present. Pt still awaiting PT[facility] for transfer back to facility

## 2022-05-11 NOTE — ED Notes (Signed)
Pt given water and a [place] sandwich, pt updated that still currently waiting on PT[facility] to come transport pt, pt verbalized understanding. Pt sitting in chair with bedside table in front of him eating his food, no other concerns at current.

## 2022-05-11 NOTE — ED Notes (Signed)
PT[facility] has arrived to transport pt back to [facility]

## 2022-05-19 ENCOUNTER — Ambulatory Visit (HOSPITAL_COMMUNITY): Admission: RE | Admit: 2022-05-19 | Payer: Medicare HMO | Source: Ambulatory Visit | Admitting: Orthopedic Surgery

## 2022-05-19 ENCOUNTER — Encounter (HOSPITAL_COMMUNITY): Admission: RE | Payer: Self-pay | Source: Ambulatory Visit

## 2022-05-19 SURGERY — ARTHROPLASTY, HIP, TOTAL, ANTERIOR APPROACH
Anesthesia: Spinal | Site: Hip | Laterality: Right

## 2022-05-24 ENCOUNTER — Telehealth: Payer: Self-pay | Admitting: Internal Medicine

## 2022-05-24 NOTE — Telephone Encounter (Signed)
Paperwork received for surgical clearance from [facility]. Placed in provider box up front.

## 2022-05-24 NOTE — Telephone Encounter (Signed)
Received and given to PCP to review and sign

## 2022-05-30 ENCOUNTER — Emergency Department (HOSPITAL_COMMUNITY): Payer: Medicare HMO

## 2022-05-30 ENCOUNTER — Other Ambulatory Visit: Payer: Self-pay

## 2022-05-30 ENCOUNTER — Inpatient Hospital Stay (HOSPITAL_COMMUNITY): Payer: Medicare HMO

## 2022-05-30 ENCOUNTER — Inpatient Hospital Stay (HOSPITAL_COMMUNITY)
Admission: EM | Admit: 2022-05-30 | Discharge: 2022-06-05 | DRG: 432 | Disposition: A | Discharge status: Skilled Nursing Facility | Payer: Medicare HMO | Source: Skilled Nursing Facility | Attending: Internal Medicine | Admitting: Internal Medicine

## 2022-05-30 ENCOUNTER — Encounter (HOSPITAL_COMMUNITY): Payer: Self-pay

## 2022-05-30 DIAGNOSIS — Z6841 Body Mass Index (BMI) 40.0 and over, adult: Secondary | ICD-10-CM

## 2022-05-30 DIAGNOSIS — K219 Gastro-esophageal reflux disease without esophagitis: Secondary | ICD-10-CM | POA: Diagnosis present

## 2022-05-30 DIAGNOSIS — D62 Acute posthemorrhagic anemia: Secondary | ICD-10-CM | POA: Diagnosis present

## 2022-05-30 DIAGNOSIS — Z955 Presence of coronary angioplasty implant and graft: Secondary | ICD-10-CM

## 2022-05-30 DIAGNOSIS — Z7951 Long term (current) use of inhaled steroids: Secondary | ICD-10-CM

## 2022-05-30 DIAGNOSIS — D696 Thrombocytopenia, unspecified: Secondary | ICD-10-CM | POA: Diagnosis present

## 2022-05-30 DIAGNOSIS — Z8673 Personal history of transient ischemic attack (TIA), and cerebral infarction without residual deficits: Secondary | ICD-10-CM

## 2022-05-30 DIAGNOSIS — D684 Acquired coagulation factor deficiency: Secondary | ICD-10-CM | POA: Diagnosis present

## 2022-05-30 DIAGNOSIS — D649 Anemia, unspecified: Secondary | ICD-10-CM | POA: Diagnosis not present

## 2022-05-30 DIAGNOSIS — D863 Sarcoidosis of skin: Secondary | ICD-10-CM | POA: Diagnosis present

## 2022-05-30 DIAGNOSIS — E1142 Type 2 diabetes mellitus with diabetic polyneuropathy: Secondary | ICD-10-CM | POA: Diagnosis present

## 2022-05-30 DIAGNOSIS — F32A Depression, unspecified: Secondary | ICD-10-CM | POA: Diagnosis present

## 2022-05-30 DIAGNOSIS — K7460 Unspecified cirrhosis of liver: Secondary | ICD-10-CM | POA: Diagnosis present

## 2022-05-30 DIAGNOSIS — R195 Other fecal abnormalities: Secondary | ICD-10-CM | POA: Diagnosis not present

## 2022-05-30 DIAGNOSIS — D61818 Other pancytopenia: Secondary | ICD-10-CM | POA: Diagnosis present

## 2022-05-30 DIAGNOSIS — I2510 Atherosclerotic heart disease of native coronary artery without angina pectoris: Secondary | ICD-10-CM | POA: Diagnosis not present

## 2022-05-30 DIAGNOSIS — R161 Splenomegaly, not elsewhere classified: Secondary | ICD-10-CM | POA: Diagnosis present

## 2022-05-30 DIAGNOSIS — F419 Anxiety disorder, unspecified: Secondary | ICD-10-CM | POA: Diagnosis present

## 2022-05-30 DIAGNOSIS — N179 Acute kidney failure, unspecified: Secondary | ICD-10-CM | POA: Diagnosis not present

## 2022-05-30 DIAGNOSIS — R188 Other ascites: Secondary | ICD-10-CM | POA: Diagnosis present

## 2022-05-30 DIAGNOSIS — K922 Gastrointestinal hemorrhage, unspecified: Secondary | ICD-10-CM | POA: Diagnosis present

## 2022-05-30 DIAGNOSIS — F418 Other specified anxiety disorders: Secondary | ICD-10-CM | POA: Diagnosis present

## 2022-05-30 DIAGNOSIS — Z79899 Other long term (current) drug therapy: Secondary | ICD-10-CM

## 2022-05-30 DIAGNOSIS — E871 Hypo-osmolality and hyponatremia: Secondary | ICD-10-CM | POA: Diagnosis present

## 2022-05-30 DIAGNOSIS — N1831 Chronic kidney disease, stage 3a: Secondary | ICD-10-CM | POA: Diagnosis present

## 2022-05-30 DIAGNOSIS — B169 Acute hepatitis B without delta-agent and without hepatic coma: Secondary | ICD-10-CM | POA: Diagnosis present

## 2022-05-30 DIAGNOSIS — E785 Hyperlipidemia, unspecified: Secondary | ICD-10-CM | POA: Diagnosis present

## 2022-05-30 DIAGNOSIS — Z7984 Long term (current) use of oral hypoglycemic drugs: Secondary | ICD-10-CM

## 2022-05-30 DIAGNOSIS — R4189 Other symptoms and signs involving cognitive functions and awareness: Secondary | ICD-10-CM | POA: Diagnosis present

## 2022-05-30 DIAGNOSIS — K3189 Other diseases of stomach and duodenum: Secondary | ICD-10-CM

## 2022-05-30 DIAGNOSIS — K766 Portal hypertension: Secondary | ICD-10-CM | POA: Diagnosis present

## 2022-05-30 DIAGNOSIS — G9341 Metabolic encephalopathy: Secondary | ICD-10-CM | POA: Diagnosis present

## 2022-05-30 DIAGNOSIS — K760 Fatty (change of) liver, not elsewhere classified: Secondary | ICD-10-CM | POA: Diagnosis present

## 2022-05-30 DIAGNOSIS — D500 Iron deficiency anemia secondary to blood loss (chronic): Secondary | ICD-10-CM | POA: Diagnosis not present

## 2022-05-30 DIAGNOSIS — I10 Essential (primary) hypertension: Secondary | ICD-10-CM | POA: Diagnosis present

## 2022-05-30 DIAGNOSIS — E861 Hypovolemia: Secondary | ICD-10-CM | POA: Diagnosis present

## 2022-05-30 DIAGNOSIS — I2489 Other forms of acute ischemic heart disease: Secondary | ICD-10-CM | POA: Diagnosis present

## 2022-05-30 DIAGNOSIS — Z8719 Personal history of other diseases of the digestive system: Secondary | ICD-10-CM

## 2022-05-30 DIAGNOSIS — Z7902 Long term (current) use of antithrombotics/antiplatelets: Secondary | ICD-10-CM

## 2022-05-30 DIAGNOSIS — I8500 Esophageal varices without bleeding: Secondary | ICD-10-CM | POA: Diagnosis not present

## 2022-05-30 DIAGNOSIS — Z951 Presence of aortocoronary bypass graft: Secondary | ICD-10-CM

## 2022-05-30 DIAGNOSIS — Z800 Family history of malignant neoplasm of digestive organs: Secondary | ICD-10-CM

## 2022-05-30 DIAGNOSIS — I8510 Secondary esophageal varices without bleeding: Secondary | ICD-10-CM | POA: Diagnosis present

## 2022-05-30 DIAGNOSIS — I252 Old myocardial infarction: Secondary | ICD-10-CM

## 2022-05-30 DIAGNOSIS — B1920 Unspecified viral hepatitis C without hepatic coma: Secondary | ICD-10-CM | POA: Diagnosis not present

## 2022-05-30 DIAGNOSIS — I1310 Hypertensive heart and chronic kidney disease without heart failure, with stage 1 through stage 4 chronic kidney disease, or unspecified chronic kidney disease: Secondary | ICD-10-CM | POA: Diagnosis present

## 2022-05-30 DIAGNOSIS — M25551 Pain in right hip: Secondary | ICD-10-CM | POA: Diagnosis present

## 2022-05-30 DIAGNOSIS — E1122 Type 2 diabetes mellitus with diabetic chronic kidney disease: Secondary | ICD-10-CM | POA: Diagnosis present

## 2022-05-30 DIAGNOSIS — Z833 Family history of diabetes mellitus: Secondary | ICD-10-CM

## 2022-05-30 DIAGNOSIS — Z7982 Long term (current) use of aspirin: Secondary | ICD-10-CM

## 2022-05-30 DIAGNOSIS — E8770 Fluid overload, unspecified: Secondary | ICD-10-CM | POA: Diagnosis present

## 2022-05-30 DIAGNOSIS — J449 Chronic obstructive pulmonary disease, unspecified: Secondary | ICD-10-CM | POA: Diagnosis not present

## 2022-05-30 DIAGNOSIS — G4733 Obstructive sleep apnea (adult) (pediatric): Secondary | ICD-10-CM | POA: Diagnosis present

## 2022-05-30 LAB — CBC WITH DIFFERENTIAL/PLATELET
Abs Immature Granulocytes: 0.02 10*3/uL (ref 0.00–0.07)
Basophils Absolute: 0 10*3/uL (ref 0.0–0.1)
Basophils Relative: 0 %
Eosinophils Absolute: 0.2 10*3/uL (ref 0.0–0.5)
Eosinophils Relative: 2 %
HCT: 14.5 % — ABNORMAL LOW (ref 39.0–52.0)
Hemoglobin: 4.4 g/dL — CL (ref 13.0–17.0)
Immature Granulocytes: 0 %
Lymphocytes Relative: 29 %
Lymphs Abs: 1.9 10*3/uL (ref 0.7–4.0)
MCH: 33.1 pg (ref 26.0–34.0)
MCHC: 30.3 g/dL (ref 30.0–36.0)
MCV: 109 fL — ABNORMAL HIGH (ref 80.0–100.0)
Monocytes Absolute: 0.9 10*3/uL (ref 0.1–1.0)
Monocytes Relative: 13 %
Neutro Abs: 3.7 10*3/uL (ref 1.7–7.7)
Neutrophils Relative %: 56 %
Platelets: 83 10*3/uL — ABNORMAL LOW (ref 150–400)
RBC: 1.33 MIL/uL — ABNORMAL LOW (ref 4.22–5.81)
RDW: 16.2 % — ABNORMAL HIGH (ref 11.5–15.5)
WBC: 6.8 10*3/uL (ref 4.0–10.5)
nRBC: 0 % (ref 0.0–0.2)

## 2022-05-30 LAB — COMPREHENSIVE METABOLIC PANEL
ALT: 50 U/L — ABNORMAL HIGH (ref 0–44)
AST: 117 U/L — ABNORMAL HIGH (ref 15–41)
Albumin: 2.1 g/dL — ABNORMAL LOW (ref 3.5–5.0)
Alkaline Phosphatase: 97 U/L (ref 38–126)
Anion gap: 8 (ref 5–15)
BUN: 30 mg/dL — ABNORMAL HIGH (ref 8–23)
CO2: 22 mmol/L (ref 22–32)
Calcium: 7.7 mg/dL — ABNORMAL LOW (ref 8.9–10.3)
Chloride: 103 mmol/L (ref 98–111)
Creatinine, Ser: 1.25 mg/dL — ABNORMAL HIGH (ref 0.61–1.24)
GFR, Estimated: >60 mL/min (ref >60)
Glucose, Bld: 178 mg/dL — ABNORMAL HIGH (ref 70–99)
Potassium: 4.2 mmol/L (ref 3.5–5.1)
Sodium: 133 mmol/L — ABNORMAL LOW (ref 135–145)
Total Bilirubin: 1.3 mg/dL — ABNORMAL HIGH (ref 0.3–1.2)
Total Protein: 5.9 g/dL — ABNORMAL LOW (ref 6.5–8.1)

## 2022-05-30 LAB — TYPE AND SCREEN

## 2022-05-30 LAB — PREPARE RBC (CROSSMATCH)

## 2022-05-30 LAB — BPAM RBC
ISSUE DATE / TIME: 202405051842
ISSUE DATE / TIME: 202405052318

## 2022-05-30 LAB — TROPONIN I (HIGH SENSITIVITY)
Troponin I (High Sensitivity): 139 ng/L (ref <18)
Troponin I (High Sensitivity): 129 ng/L (ref <18)

## 2022-05-30 LAB — GLUCOSE, CAPILLARY: Glucose-Capillary: 128 mg/dL — ABNORMAL HIGH (ref 70–99)

## 2022-05-30 LAB — BRAIN NATRIURETIC PEPTIDE: B Natriuretic Peptide: 431.8 pg/mL — ABNORMAL HIGH (ref 0.0–100.0)

## 2022-05-30 LAB — AMMONIA: Ammonia: 26 umol/L (ref 9–35)

## 2022-05-30 MED ORDER — SODIUM CHLORIDE 0.9% IV SOLUTION: Freq: Once | INTRAVENOUS | Status: AC

## 2022-05-30 MED ORDER — INSULIN ASPART 100 UNIT/ML IJ SOLN
0.0000 [IU] | Freq: Every day | INTRAMUSCULAR | Status: DC
  Filled 2022-05-30: qty 0.05

## 2022-05-30 MED ORDER — LACTATED RINGERS IV SOLN: INTRAVENOUS | Status: DC

## 2022-05-30 MED ORDER — ONDANSETRON HCL 4 MG PO TABS: 4.0000 mg | ORAL_TABLET | Freq: Four times a day (QID) | ORAL | Status: DC | PRN

## 2022-05-30 MED ORDER — PANTOPRAZOLE SODIUM 40 MG IV SOLR
40.0000 mg | Freq: Two times a day (BID) | INTRAVENOUS | Status: DC
  Administered 2022-05-30 – 2022-06-03 (×8): 40 mg via INTRAVENOUS
  Filled 2022-05-30 (×8): qty 10

## 2022-05-30 MED ORDER — INSULIN ASPART 100 UNIT/ML IJ SOLN
0.0000 [IU] | Freq: Three times a day (TID) | INTRAMUSCULAR | Status: DC
  Administered 2022-05-31: 2 [IU] via SUBCUTANEOUS
  Administered 2022-06-01 – 2022-06-02 (×4): 1 [IU] via SUBCUTANEOUS
  Administered 2022-06-03: 2 [IU] via SUBCUTANEOUS
  Administered 2022-06-03 – 2022-06-04 (×4): 1 [IU] via SUBCUTANEOUS
  Administered 2022-06-05: 2 [IU] via SUBCUTANEOUS
  Filled 2022-05-30: qty 0.09

## 2022-05-30 MED ORDER — ONDANSETRON HCL 4 MG/2ML IJ SOLN: 4.0000 mg | Freq: Four times a day (QID) | INTRAMUSCULAR | Status: DC | PRN

## 2022-05-30 NOTE — ED Triage Notes (Signed)
Per EMS pt sent for abnormal lab value but EMS was not sure what lab was abnormal. EMS states pt also has dementia baseline but has been more altered lately. Pt normally can hold a conversation but lately has not been able to. Unknown last known well time. One nurse at the facility said last night and the other said this morning.

## 2022-05-30 NOTE — ED Provider Notes (Signed)
[facility] AT [facility] Provider Note   CSN: [id number] Arrival date & time: 05/30/22  1455     History  Chief Complaint  Patient presents with   Abnormal Lab   Altered Mental Status    Decreased    Barry [name] is a 67 y.o. male.  This is a 67 year old male who presents from facility due to change in mental status as well as abnormal lab.  Patient has history of cognitive impairment and is unsure of what the lab is.  States that he does feel more short of breath has had whole body edema.  He denies any emesis.  No fever or chills.  No change in his urination.  Presents via EMS       Home Medications Prior to Admission medications   Medication Sig Start Date End Date Taking? Authorizing Provider  acetaminophen (TYLENOL) 500 MG tablet Take 1 tablet (500 mg total) by mouth every 8 (eight) hours as needed for moderate pain. 04/24/22   [name], MD  albuterol (PROVENTIL HFA;VENTOLIN HFA) [phone] Base) MCG/ACT inhaler Inhale 2 puffs into the lungs every 6 (six) hours as needed for wheezing or shortness of breath. 04/21/17   Kirt Boys, DO  aspirin 81 MG chewable tablet Chew 81 mg by mouth every evening.    [provider]  atorvastatin (LIPITOR) 80 MG tablet Take 1 tablet (80 mg total) by mouth daily. 05/08/22   [name], MD  Blood Glucose Monitoring Suppl (CONTOUR NEXT EZ MONITOR) w/Device KIT Test blood sugar three times daily E11.22 03/08/16   Edison Pace, RPH-CPP  busPIRone (BUSPAR) 15 MG tablet TAKE 1 TABLET BY MOUTH THREE TIMES DAILY FOR ANXIETY 07/19/17   Kirt Boys, DO  clopidogrel (PLAVIX) 75 MG tablet APPOINTMENT OVERDUE Take 1 by mouth daily Patient taking differently: Take 75 mg by mouth daily. 10/31/19   Sharon Seller, NP  cyclobenzaprine (FLEXERIL) 10 MG tablet Take 10 mg by mouth at bedtime. May take an additional 10 mg up to twice daily as needed for pain related to RIGHT HIP, DO NOT GIVE WITHIN 6  HOURS OF SCHEDULED DOSE 07/23/19   [provider]  FEROSUL 325 (65 Fe) MG tablet Take 325 mg by mouth daily. 10/13/21   [provider]  fluticasone-salmeterol (ADVAIR) 100-50 MCG/ACT AEPB Inhale 1 puff into the lungs 2 (two) times daily.    [provider]  gabapentin (NEURONTIN) 300 MG capsule Take 300 mg by mouth in the morning and at bedtime. 09/12/19   [provider]  lactulose (CHRONULAC) 10 GM/15ML solution Take 15 mLs (10 g total) by mouth 3 (three) times daily. 04/24/22   [name], MD  metFORMIN (GLUCOPHAGE) 1000 MG tablet TAKE 1 TABLET(1000 MG) BY MOUTH TWICE DAILY Patient taking differently: Take 1,000 mg by mouth 2 (two) times daily with a meal. 09/09/17   Kirt Boys, DO  metoprolol tartrate (LOPRESSOR) 25 MG tablet Take 0.5 tablets (12.5 mg total) by mouth 2 (two) times daily. 05/18/17   Kirt Boys, DO  oxyCODONE-acetaminophen (PERCOCET) 10-325 MG tablet Take 1 tablet by mouth every 8 (eight) hours as needed for pain. Patient taking differently: Take 1 tablet by mouth 3 (three) times daily. 04/24/22   [name], MD  pantoprazole (PROTONIX) 40 MG tablet TAKE 1 TABLET BY MOUTH EVERY DAY 04/12/22   Josph Macho, MD  sertraline (ZOLOFT) 100 MG tablet TAKE 2 TABLETS(200 MG) BY MOUTH DAILY 04/20/19  Sharon Seller, NP  thiamine (VITAMIN B-1) 100 MG tablet Take 1 tablet (100 mg total) by mouth daily. 03/06/22   [name], MD      Allergies    Patient has no known allergies.    Review of Systems   Review of Systems  Unable to perform ROS: Dementia    Physical Exam Updated Vital Signs BP (!) 114/49 (BP Location: Left Arm)   Pulse 89   Temp 97.9 F (36.6 C) (Oral)   Resp 16   Ht 1.626 m (5\' 4" )   Wt 113.4 kg   SpO2 98%   BMI 42.91 kg/m  Physical Exam Vitals and nursing note reviewed.  Constitutional:      General: He is not in acute distress.    Appearance: Normal appearance. He is well-developed. He is  not toxic-appearing.  HENT:     Head: Normocephalic and atraumatic.  Eyes:     General: Lids are normal.     Conjunctiva/sclera: Conjunctivae normal.     Pupils: Pupils are equal, round, and reactive to light.  Neck:     Thyroid: No thyroid mass.     Trachea: No tracheal deviation.  Cardiovascular:     Rate and Rhythm: Normal rate and regular rhythm.     Heart sounds: Normal heart sounds. No murmur heard.    No gallop.  Pulmonary:     Effort: Pulmonary effort is normal. No respiratory distress.     Breath sounds: Normal breath sounds. No stridor. No decreased breath sounds, wheezing, rhonchi or rales.  Abdominal:     General: There is no distension.     Palpations: Abdomen is soft.     Tenderness: There is no abdominal tenderness. There is no rebound.  Musculoskeletal:        General: No tenderness. Normal range of motion.     Cervical back: Normal range of motion and neck supple.  Lymphadenopathy:     Comments: 3+ bilateral lower extremity pitting edema  Skin:    General: Skin is warm and dry.     Findings: No abrasion or rash.  Neurological:     General: No focal deficit present.     Mental Status: He is alert and oriented to person, place, and time. Mental status is at baseline.     GCS: GCS eye subscore is 4. GCS verbal subscore is 5. GCS motor subscore is 6.     Cranial Nerves: No cranial nerve deficit.     Sensory: No sensory deficit.     Motor: Motor function is intact.  Psychiatric:        Attention and Perception: Attention normal.        Mood and Affect: Affect is flat.     ED Results / Procedures / Treatments   Labs (all labs ordered are listed, but only abnormal results are displayed) Labs Reviewed  CBC WITH DIFFERENTIAL/PLATELET  COMPREHENSIVE METABOLIC PANEL  URINALYSIS, ROUTINE W REFLEX MICROSCOPIC  AMMONIA  TYPE AND SCREEN    EKG EKG Interpretation  Date/Time:  Sunday May 30 2022 15:07:22 EDT Ventricular Rate:  87 PR Interval:  177 QRS  Duration: 105 QT Interval:  407 QTC Calculation: 490 R Axis:   10 Text Interpretation: Sinus rhythm Low voltage, precordial leads RSR' in V1 or V2, probably normal variant ST elevation, consider inferior injury Borderline prolonged QT interval Confirmed by Lorre Nick ([id number]) on 05/30/2022 3:40:27 PM  Radiology No results found.  Procedures Procedures    Medications  Ordered in ED Medications  lactated ringers infusion (has no administration in time range)    ED Course/ Medical Decision Making/ A&P                             Medical Decision Making Amount and/or Complexity of Data Reviewed Labs: ordered. Radiology: ordered. ECG/medicine tests: ordered.  Risk Prescription drug management.  Patient is EKG per interpretation shows normal sinus rhythm. Patient here due to abnormal labs.  Old records reviewed through Care Everywhere and patient blood work done today for likely preop testing which showed hemoglobin 4.9.  Repeat here is 4.4.  Mild thrombocytopenia noted with plates of 16,109.  To guaiac patient's stools however he denies any black stools.  2 units of packed red blood cells have been ordered.  Chest x-ray per my interpretation shows concerns for possible CHF.  Be for inpatient hospitalization.  Will consult hospitalist team  CRITICAL CARE Performed by: Toy Baker Total critical care time: 50 minutes Critical care time was exclusive of separately billable procedures and treating other patients. Critical care was necessary to treat or prevent imminent or life-threatening deterioration. Critical care was time spent personally by me on the following activities: development of treatment plan with patient and/or surrogate as well as nursing, discussions with consultants, evaluation of patient's response to treatment, examination of patient, obtaining history from patient or surrogate, ordering and performing treatments and interventions, ordering and review of  laboratory studies, ordering and review of radiographic studies, pulse oximetry and re-evaluation of patient's condition.         Final Clinical Impression(s) / ED Diagnoses Final diagnoses:  None    Rx / DC Orders ED Discharge Orders     None         Lorre Nick, MD 05/30/22 1820

## 2022-05-30 NOTE — H&P (Signed)
History and Physical    Patient: Barry [name] ZOX:[id number] DOB: 1955/05/16 DOA: 05/30/2022 DOS: the patient was seen and examined on 05/30/2022 PCP: Barry Grandchild, [name]  Patient coming from: SNF  Chief Complaint:  Chief Complaint  Patient presents with   Abnormal Lab   Altered Mental Status    Decreased   HPI: Barry [name] is a 67 y.o. male with medical history significant of liver cirrhosis, CVA, GERD, hyperlipidemia, chronic kidney disease stage III, anxiety disorder, morbid obesity, coronary artery disease was brought in from skilled facility with some altered mental status and abnormal labs.  Patient had routine labs that showed very low hemoglobin.  Patient apparently has prior history of liver cirrhosis with varices.  He is unable to give history now as to whether he had melena or bright red blood per rectum.  He is reportedly guaiac positive in the ER.  Patient has hemoglobin of 4.4.  Previous hemoglobin less than a month ago on April 15 was 9.3.  Patient therefore suspected to have GI bleed.  He has had notably AKI with creatinine 1.25.  Also LFTs are more elevated and sodium 133.  He had high-sensitivity troponin first set of 139.  Urinalysis and drug screen were negative.  Ammonia level currently pending.  His platelets 83.  Patient being admitted with suspected GI bleed probably variceal bleed leading to low hemoglobin.  He has symptomatic anemia.  Review of Systems: As mentioned in the history of present illness. All other systems reviewed and are negative. Past Medical History:  Diagnosis Date   Anemia    Anxiety    Arthritis    Cerebrovascular disease    Cervical disc disorder    Chronic kidney disease    Cirrhosis of liver (HCC)    Coronary artery disease    Depression    Dyspnea    with exertion   Esophageal varices (HCC)    hx of   Family history of colon cancer 10/15/2019   GERD (gastroesophageal reflux disease)    Headache    Hyperlipidemia    Hyperlipidemia     Hypocalcemia    MI (myocardial infarction) (HCC) 04/23/2007   inferior wall   Morbid obesity (HCC) 06/26/2012   Neuropathy    secondary to diabetes   Pancreatitis    hs of   S/P CABG x 3 07/04/2012   LIMA to LAD, SVG to D1, SVG to PDA, EVH via right thigh   Sarcoidosis of skin    Sleep apnea    no cpap   Stroke (HCC)    small stroke - 2023 - legs weak , righ tleg weak   Type II or unspecified type diabetes mellitus without mention of complication, not stated as uncontrolled    Unspecified essential hypertension    Past Surgical History:  Procedure Laterality Date   CORONARY ANGIOPLASTY WITH STENT PLACEMENT  04/23/2007   PCI and stenting of mid RCA - Barry [name] @ [facility]   CORONARY ARTERY BYPASS GRAFT N/A 07/04/2012   Procedure: CORONARY ARTERY BYPASS GRAFTING (CABG);  Surgeon: Barry [name];  Location: [facility] OR;  Service: Open Heart Surgery;  Laterality: N/A;  x3 using right greater saphenous vein and left internal mammary.    INTRAOPERATIVE TRANSESOPHAGEAL ECHOCARDIOGRAM N/A 07/04/2012   Procedure: INTRAOPERATIVE TRANSESOPHAGEAL ECHOCARDIOGRAM;  Surgeon: Barry [name];  Location: [facility] OR;  Service: Open Heart Surgery;  Laterality: N/A;   LEFT HEART CATH AND CORS/GRAFTS ANGIOGRAPHY N/A 04/18/2017   Procedure: LEFT  HEART CATH AND CORS/GRAFTS ANGIOGRAPHY;  Surgeon: Barry Gess, [name];  Location: [facility] INVASIVE CV LAB;  Service: Cardiovascular;  Laterality: N/A;   LEFT HEART CATHETERIZATION WITH CORONARY ANGIOGRAM N/A 06/25/2012   Procedure: LEFT HEART CATHETERIZATION WITH CORONARY ANGIOGRAM;  Surgeon: Barry Gess, [name];  Location: [facility] CATH LAB;  Service: Cardiovascular;  Laterality: N/A;   TOOTH EXTRACTION  04/2018   4 teeth pulled    Social History:  reports that he has never smoked. He has never used smokeless tobacco. He reports that he does not drink alcohol and does not use drugs.  No Known Allergies  Family History  Problem Relation Age of Onset   Cancer Mother         unknown type; dx late 85s   Cancer Father        unknown type; dx > 50yo   Colon cancer Brother        dx late 26s   Colon cancer Brother        dx late 27s; dx 6   Cancer Sister        unknown type; dx late 20s   Diabetes Sister    Diabetes Brother    Cancer Maternal Aunt        unknown type; dx > 50yo    Prior to Admission medications   Medication Sig Start Date End Date Taking? Authorizing Provider  acetaminophen (TYLENOL) 500 MG tablet Take 1 tablet (500 mg total) by mouth every 8 (eight) hours as needed for moderate pain. 04/24/22   Barry [name]  albuterol (PROVENTIL HFA;VENTOLIN HFA) [phone] Base) MCG/ACT inhaler Inhale 2 puffs into the lungs every 6 (six) hours as needed for wheezing or shortness of breath. 04/21/17   Barry [name]  aspirin 81 MG chewable tablet Chew 81 mg by mouth every evening.    Provider, Historical, [name]  atorvastatin (LIPITOR) 80 MG tablet Take 1 tablet (80 mg total) by mouth daily. 05/08/22   Barry [name]  Blood Glucose Monitoring Suppl (CONTOUR NEXT EZ MONITOR) w/Device KIT Test blood sugar three times daily E11.22 03/08/16   Barry [name], Barry [name]  busPIRone (BUSPAR) 15 MG tablet TAKE 1 TABLET BY MOUTH THREE TIMES DAILY FOR ANXIETY 07/19/17   Barry [name]  clopidogrel (PLAVIX) 75 MG tablet APPOINTMENT OVERDUE Take 1 by mouth daily Patient taking differently: Take 75 mg by mouth daily. 10/31/19   Barry [name]  cyclobenzaprine (FLEXERIL) 10 MG tablet Take 10 mg by mouth at bedtime. May take an additional 10 mg up to twice daily as needed for pain related to RIGHT HIP, [name] NOT GIVE WITHIN 6 HOURS OF SCHEDULED DOSE 07/23/19   Provider, Historical, [name]  FEROSUL 325 (65 Fe) MG tablet Take 325 mg by mouth daily. 10/13/21   Provider, Historical, [name]  fluticasone-salmeterol (ADVAIR) 100-50 MCG/ACT AEPB Inhale 1 puff into the lungs 2 (two) times daily.    Provider, Historical, [name]  gabapentin (NEURONTIN) 300 MG capsule Take 300 mg by  mouth in the morning and at bedtime. 09/12/19   Provider, Historical, [name]  lactulose (CHRONULAC) 10 GM/15ML solution Take 15 mLs (10 g total) by mouth 3 (three) times daily. 04/24/22   Barry [name]  metFORMIN (GLUCOPHAGE) 1000 MG tablet TAKE 1 TABLET(1000 MG) BY MOUTH TWICE DAILY Patient taking differently: Take 1,000 mg by mouth 2 (two) times daily with a meal. 09/09/17   Barry [name]  metoprolol tartrate (LOPRESSOR) 25 MG tablet Take 0.5  tablets (12.5 mg total) by mouth 2 (two) times daily. 05/18/17   Barry [name]  oxyCODONE-acetaminophen (PERCOCET) 10-325 MG tablet Take 1 tablet by mouth every 8 (eight) hours as needed for pain. Patient taking differently: Take 1 tablet by mouth 3 (three) times daily. 04/24/22   Barry [name]  pantoprazole (PROTONIX) 40 MG tablet TAKE 1 TABLET BY MOUTH EVERY DAY 04/12/22   Josph Macho, [name]  sertraline (ZOLOFT) 100 MG tablet TAKE 2 TABLETS(200 MG) BY MOUTH DAILY 04/20/19   Barry [name]  thiamine (VITAMIN B-1) 100 MG tablet Take 1 tablet (100 mg total) by mouth daily. 03/06/22   Barry Grandchild, [name]    Physical Exam: Vitals:   05/30/22 1720 05/30/22 1730 05/30/22 1750 05/30/22 1853  BP: 92/68 (!) 89/65 108/87 (!) 92/46  Pulse: 87 91 85 84  Resp: (!) 21 20 20 15   Temp:    97.9 F (36.6 C)  TempSrc:    Oral  SpO2: 100% 96% 100%   Weight:      Height:       Constitutional: Confused, morbidly obese, NAD, calm, comfortable Eyes: PERRL, lids and conjunctivae normal ENMT: Mucous membranes are dry. Posterior pharynx clear of any exudate or lesions.Normal dentition.  Neck: normal, supple, no masses, no thyromegaly Respiratory: Coarse BS bilaterally, no wheezing, Diffuse crackles. Normal respiratory effort. No accessory muscle use.  Cardiovascular: Regular rate and rhythm, no murmurs / rubs / gallops. No extremity edema. 2+ pedal pulses. No carotid bruits.  Abdomen: Distended, no masses palpated. No hepatosplenomegaly. Bowel  sounds positive.  Musculoskeletal: Good range of motion, no joint swelling or tenderness, Skin: no rashes, lesions, ulcers. No induration Neurologic: CN 2-12 grossly intact. Sensation intact, DTR normal. Strength 5/5 in all 4.  Psychiatric: Confused, no distress.  Data Reviewed:  Sodium 133, Glucose 178, BUN 30, Creatinine 1.25, Albumin 2.1, AST 117, ALT 50, Troponin 139, then 129, Hemoglobin 4.4, Platelets 83, CXR Evidence of Fluid Overload.  Assessment and Plan:  #1 Symptomatic Anemia: Most likely due to Variceal bleed. Will transfuse 2 units PRBC, IV Protonix, GI consulted. Monitor.  #2 Liver Cirrhosis: No Ascites.  Will check ammonia level for possible hepatic encephalopathy especially with the confusion.  #3 AKI: Hydrate when possible.  Patient's chest x-ray showed evidence of fluid overload so we will not give IV fluids.  He is not short of breath so we will keep monitoring mainly.  #4 hyponatremia: Sodium level is stable.  Most likely secondary to liver cirrhosis.  Continue to monitor.  #5 acute metabolic encephalopathy: Probably hepatic encephalopathy.  Check ammonia level.  #6 elevated troponin: Patient has history of coronary artery disease.  Stable.  Continue to monitor.  #7 thrombocytopenia: Secondary to liver cirrhosis.  Continue to monitor  #8 essential hypertension: Blood pressure is on the low side.  Will hold medications.  #9 GERD: Continue with PPIs.  #10 obstructive sleep apnea: CPAP at night.  #11 hyperlipidemia: Statin.     Advance Care Planning:   Code Status: Prior full code  Consults: Gastroenterology, Barry. Ewing Schlein  Family Communication: No family at bedside  Severity of Illness: The appropriate patient status for this patient is INPATIENT. Inpatient status is judged to be reasonable and necessary in order to provide the required intensity of service to ensure the patient's safety. The patient's presenting symptoms, physical exam findings, and initial  radiographic and laboratory data in the context of their chronic comorbidities is felt to place them at high risk  for further clinical deterioration. Furthermore, it is not anticipated that the patient will be medically stable for discharge from the hospital within 2 midnights of admission.   * I certify that at the point of admission it is my clinical judgment that the patient will require inpatient hospital care spanning beyond 2 midnights from the point of admission due to high intensity of service, high risk for further deterioration and high frequency of surveillance required.*  AuthorLonia Blood, [name] 05/30/2022 6:58 PM  For on call review [email or web address].

## 2022-05-30 NOTE — ED Notes (Signed)
Pt fell out of bed repositioning from laying to sitting. Pt Landed on his abdomen. Dr. Freida Busman bedside to assess patient. Additional staff bedside to help pt stand and get back in bed.

## 2022-05-31 DIAGNOSIS — N179 Acute kidney failure, unspecified: Secondary | ICD-10-CM

## 2022-05-31 DIAGNOSIS — G9341 Metabolic encephalopathy: Secondary | ICD-10-CM | POA: Diagnosis not present

## 2022-05-31 DIAGNOSIS — K7460 Unspecified cirrhosis of liver: Secondary | ICD-10-CM

## 2022-05-31 DIAGNOSIS — I10 Essential (primary) hypertension: Secondary | ICD-10-CM

## 2022-05-31 DIAGNOSIS — D649 Anemia, unspecified: Secondary | ICD-10-CM | POA: Diagnosis not present

## 2022-05-31 DIAGNOSIS — R195 Other fecal abnormalities: Secondary | ICD-10-CM | POA: Diagnosis not present

## 2022-05-31 LAB — TYPE AND SCREEN
ABO/RH(D): A POS
Antibody Screen: NEGATIVE
Unit division: 0

## 2022-05-31 LAB — BPAM RBC
Blood Product Expiration Date: 202405282359
ISSUE DATE / TIME: 202405061022
Unit Type and Rh: 6200
Unit Type and Rh: 6200

## 2022-05-31 LAB — COMPREHENSIVE METABOLIC PANEL
ALT: 48 U/L — ABNORMAL HIGH (ref 0–44)
AST: 105 U/L — ABNORMAL HIGH (ref 15–41)
Albumin: 2 g/dL — ABNORMAL LOW (ref 3.5–5.0)
Alkaline Phosphatase: 92 U/L (ref 38–126)
Anion gap: 6 (ref 5–15)
BUN: 29 mg/dL — ABNORMAL HIGH (ref 8–23)
CO2: 23 mmol/L (ref 22–32)
Calcium: 7.3 mg/dL — ABNORMAL LOW (ref 8.9–10.3)
Chloride: 104 mmol/L (ref 98–111)
Creatinine, Ser: 0.96 mg/dL (ref 0.61–1.24)
GFR, Estimated: >60 mL/min (ref >60)
Glucose, Bld: 129 mg/dL — ABNORMAL HIGH (ref 70–99)
Potassium: 3.7 mmol/L (ref 3.5–5.1)
Sodium: 133 mmol/L — ABNORMAL LOW (ref 135–145)
Total Bilirubin: 1.7 mg/dL — ABNORMAL HIGH (ref 0.3–1.2)
Total Protein: 5.4 g/dL — ABNORMAL LOW (ref 6.5–8.1)

## 2022-05-31 LAB — HEMOGLOBIN AND HEMATOCRIT, BLOOD
HCT: 27.3 % — ABNORMAL LOW (ref 39.0–52.0)
Hemoglobin: 9 g/dL — ABNORMAL LOW (ref 13.0–17.0)

## 2022-05-31 LAB — CBC
HCT: 19.8 % — ABNORMAL LOW (ref 39.0–52.0)
Hemoglobin: 6.4 g/dL — CL (ref 13.0–17.0)
MCH: 33.5 pg (ref 26.0–34.0)
MCHC: 32.3 g/dL (ref 30.0–36.0)
MCV: 103.7 fL — ABNORMAL HIGH (ref 80.0–100.0)
Platelets: 57 10*3/uL — ABNORMAL LOW (ref 150–400)
RBC: 1.91 MIL/uL — ABNORMAL LOW (ref 4.22–5.81)
RDW: 19.2 % — ABNORMAL HIGH (ref 11.5–15.5)
WBC: 5.1 10*3/uL (ref 4.0–10.5)
nRBC: 0 % (ref 0.0–0.2)

## 2022-05-31 LAB — GLUCOSE, CAPILLARY
Glucose-Capillary: 93 mg/dL (ref 70–99)
Glucose-Capillary: 156 mg/dL — ABNORMAL HIGH (ref 70–99)
Glucose-Capillary: 117 mg/dL — ABNORMAL HIGH (ref 70–99)
Glucose-Capillary: 118 mg/dL — ABNORMAL HIGH (ref 70–99)

## 2022-05-31 LAB — OCCULT BLOOD X 1 CARD TO LAB, STOOL: Fecal Occult Bld: POSITIVE — AB

## 2022-05-31 LAB — PREPARE RBC (CROSSMATCH)

## 2022-05-31 MED ORDER — SODIUM CHLORIDE 0.9 % IV SOLN: INTRAVENOUS | Status: AC

## 2022-05-31 MED ORDER — OCTREOTIDE LOAD VIA INFUSION
50.0000 ug | Freq: Once | INTRAVENOUS | Status: AC
  Administered 2022-05-31: 50 ug via INTRAVENOUS
  Filled 2022-05-31: qty 25

## 2022-05-31 MED ORDER — OXYCODONE HCL 5 MG PO TABS
5.0000 mg | ORAL_TABLET | ORAL | Status: DC | PRN
  Administered 2022-05-31 – 2022-06-04 (×11): 5 mg via ORAL
  Filled 2022-05-31 (×11): qty 1

## 2022-05-31 MED ORDER — LACTULOSE 10 GM/15ML PO SOLN
20.0000 g | Freq: Two times a day (BID) | ORAL | Status: DC
  Administered 2022-05-31 – 2022-06-05 (×9): 20 g via ORAL
  Filled 2022-05-31 (×11): qty 30

## 2022-05-31 MED ORDER — SODIUM CHLORIDE 0.9% IV SOLUTION: Freq: Once | INTRAVENOUS | Status: AC

## 2022-05-31 MED ORDER — SODIUM CHLORIDE 0.9 % IV SOLN
50.0000 ug/h | INTRAVENOUS | Status: DC
  Administered 2022-05-31 – 2022-06-03 (×5): 50 ug/h via INTRAVENOUS
  Filled 2022-05-31 (×8): qty 1

## 2022-05-31 MED ORDER — SODIUM CHLORIDE 0.9 % IV SOLN: INTRAVENOUS | Status: DC

## 2022-05-31 MED ORDER — SODIUM CHLORIDE 0.9 % IV SOLN
2.0000 g | INTRAVENOUS | Status: AC
  Administered 2022-05-31 – 2022-06-04 (×5): 2 g via INTRAVENOUS
  Filled 2022-05-31 (×5): qty 20

## 2022-05-31 NOTE — H&P (View-Only) (Signed)
 Referring Provider: Dr. Abraham Feliz Ortriz Primary Care Physician:  Jones, Thomas L, MD Primary Gastroenterologist:  Dr. Daniel Jacobs   Reason for Consultation:  Anemia, cirrhosis   HPI: Barry [name] is a 67 y.o. male with a past medical history of anxiety, depression, arthritis, CAD s/p MI with stent placement 03/2007, s/p 3 vessel CABG 2014, CVA, DM type II, obstructive sleep apnea, GERD, colon polyps and reported cirrhosis with esophageal varices.   He was admitted from to [facility] from [facility] due to mental status changes an abnormal labs. Hemoglobin level of 4.9 Hg 9.3 on 4/15).  Labs in the ED showed a WBC count of 6.8.  Hemoglobin 4.4.  Hematocrit 14.5.  MCV 109.  Platelet 83.  Sodium 133.  Potassium 4.2.  Glucose 178.  BUN 30.  Creatinine 1.25.  Total bili 1.3.  Alk phos 97.  AST 117.  ALT 50.  Albumin 2.1.  BNP 431.8.  Ammonia 26.  Troponin 139. INR not done.   He was transfused 2 units of PRBCs -> post transfusion H/H 6.4. One unit of PRBCs ordered, not yet transfused. Nursing staff at the bedside at this time to place IV.   I contacted [facility] and spoke to nurse Shea.  She confirmed the patient's last dose of aspirin and Plavix for 9 AM on Sunday, 05/30/2022.  She denies any knowledge of the the patient passing any bloody or black stools.  He did not demonstrate any evidence of nausea/vomiting or abdominal pain since his admission.  He has frequent right hip pain.   Patient is confused at this time and is unable to provide any accurate history.  I attempted to contact his friend is listed as his contact person without success.  No family contacts listed.  Per prior hospitalist's notes March and April 2024, it was noted the patient underwent an EGD 06/17/2021 with Grade I varices and moderate portal hypertensive gastropathy. However, I am not able to locate any endoscopic records in Epic or Care everywhere.  At that time, it was noted "he had meticulous  records of his medical hx at bedside inside folders". I contacted the patient's PCP Dr. Thomas Jones and he did not have EGD records.  ADDENDUM: Patient's friend at the bedside provide EGD/colonoscopy records as follows: See full report under Media.     He was previously admitted to the hospital 3/24 - 04/24/2022 with altered mental status.  LFTs were elevated with AST 190 and ALT 103. Ammonia level was 54. INR 1.3. Hepatitis B surface antigen was reactive. Hep B IgM was equivocal. Hep A IgM nonreactive. Hep C antibody nonreactive. CT head to have a right globe prosthesis. He was started on lactulose dose and his mental status improved. No further hepatology work up was done. Discharged to SNF 04/24/2022.  Readmitted to the hospital later the same day because the SNF did not have a CPAP machine. He was subsequently discharged home 04/27/2022 when the SNF was  able to provide CPAP.  GI PROCEDURES:  Had endoscopy on 06/17/2021 with Grade I varices and moderate portal hypertensive gastropathy.     Past Medical History:  Diagnosis Date   Anemia    Anxiety    Arthritis    Cerebrovascular disease    Cervical disc disorder    Chronic kidney disease    Cirrhosis of liver (HCC)    Coronary artery disease    Depression    Dyspnea    with exertion     Esophageal varices (HCC)    hx of   Family history of colon cancer 10/15/2019   GERD (gastroesophageal reflux disease)    Headache    Hyperlipidemia    Hyperlipidemia    Hypocalcemia    MI (myocardial infarction) (HCC) 04/23/2007   inferior wall   Morbid obesity (HCC) 06/26/2012   Neuropathy    secondary to diabetes   Pancreatitis    hs of   S/P CABG x 3 07/04/2012   LIMA to LAD, SVG to D1, SVG to PDA, EVH via right thigh   Sarcoidosis of skin    Sleep apnea    no cpap   Stroke (HCC)    small stroke - 2023 - legs weak , righ tleg weak   Type II or unspecified type diabetes mellitus without mention of complication, not stated as  uncontrolled    Unspecified essential hypertension     Past Surgical History:  Procedure Laterality Date   CORONARY ANGIOPLASTY WITH STENT PLACEMENT  04/23/2007   PCI and stenting of mid RCA - Dr Kalil @ [facility]   CORONARY ARTERY BYPASS GRAFT N/A 07/04/2012   Procedure: CORONARY ARTERY BYPASS GRAFTING (CABG);  Surgeon: Clarence H Owen, MD;  Location: [facility] OR;  Service: Open Heart Surgery;  Laterality: N/A;  x3 using right greater saphenous vein and left internal mammary.    INTRAOPERATIVE TRANSESOPHAGEAL ECHOCARDIOGRAM N/A 07/04/2012   Procedure: INTRAOPERATIVE TRANSESOPHAGEAL ECHOCARDIOGRAM;  Surgeon: Clarence H Owen, MD;  Location: [facility] OR;  Service: Open Heart Surgery;  Laterality: N/A;   LEFT HEART CATH AND CORS/GRAFTS ANGIOGRAPHY N/A 04/18/2017   Procedure: LEFT HEART CATH AND CORS/GRAFTS ANGIOGRAPHY;  Surgeon: Berry, Jonathan J, MD;  Location: [facility] INVASIVE CV LAB;  Service: Cardiovascular;  Laterality: N/A;   LEFT HEART CATHETERIZATION WITH CORONARY ANGIOGRAM N/A 06/25/2012   Procedure: LEFT HEART CATHETERIZATION WITH CORONARY ANGIOGRAM;  Surgeon: Jonathan J Berry, MD;  Location: [facility] CATH LAB;  Service: Cardiovascular;  Laterality: N/A;   TOOTH EXTRACTION  04/2018   4 teeth pulled     Prior to Admission medications   Medication Sig Start Date End Date Taking? Authorizing Provider  acetaminophen (TYLENOL) 500 MG tablet Take 1 tablet (500 mg total) by mouth every 8 (eight) hours as needed for moderate pain. 04/24/22  Yes Thompson, Daniel V, MD  albuterol (PROVENTIL HFA;VENTOLIN HFA) 108 (90 Base) MCG/ACT inhaler Inhale 2 puffs into the lungs every 6 (six) hours as needed for wheezing or shortness of breath. 04/21/17  Yes Carter, Monica, DO  aspirin 81 MG chewable tablet Chew 81 mg by mouth daily.   Yes [provider]  atorvastatin (LIPITOR) 80 MG tablet Take 1 tablet (80 mg total) by mouth daily. Patient taking differently: Take 80 mg by mouth every evening. 05/08/22  Yes Thompson, Daniel V,  MD  busPIRone (BUSPAR) 15 MG tablet TAKE 1 TABLET BY MOUTH THREE TIMES DAILY FOR ANXIETY Patient taking differently: Take 15 mg by mouth 3 (three) times daily. 07/19/17  Yes Carter, Monica, DO  cetirizine (ZYRTEC) 10 MG tablet Take 10 mg by mouth daily.   Yes [provider]  clopidogrel (PLAVIX) 75 MG tablet APPOINTMENT OVERDUE Take 1 by mouth daily Patient taking differently: Take 75 mg by mouth daily. 10/31/19  Yes Eubanks, Jessica K, NP  cyclobenzaprine (FLEXERIL) 10 MG tablet Take 10 mg by mouth at bedtime. May take an additional 10 mg up to twice daily as needed for pain related to RIGHT HIP, DO NOT GIVE WITHIN 6 HOURS OF SCHEDULED   DOSE 07/23/19  Yes [provider]  DULoxetine (CYMBALTA) 20 MG capsule Take 20 mg by mouth daily.   Yes [provider]  FEROSUL 325 (65 Fe) MG tablet Take 325 mg by mouth daily. 10/13/21  Yes [provider]  fluticasone-salmeterol (ADVAIR) 100-50 MCG/ACT AEPB Inhale 1 puff into the lungs 2 (two) times daily.   Yes [provider]  furosemide (LASIX) 40 MG tablet Take 40 mg by mouth daily.   Yes [provider]  gabapentin (NEURONTIN) 300 MG capsule Take 300 mg by mouth in the morning and at bedtime. 09/12/19  Yes [provider]  lactulose (CHRONULAC) 10 GM/15ML solution Take 15 mLs (10 g total) by mouth 3 (three) times daily. 04/24/22  Yes Thompson, Daniel V, MD  metFORMIN (GLUCOPHAGE) 1000 MG tablet TAKE 1 TABLET(1000 MG) BY MOUTH TWICE DAILY Patient taking differently: Take 1,000 mg by mouth 2 (two) times daily with a meal. 09/09/17  Yes Carter, Monica, DO  metoprolol tartrate (LOPRESSOR) 25 MG tablet Take 0.5 tablets (12.5 mg total) by mouth 2 (two) times daily. 05/18/17  Yes Carter, Monica, DO  oxyCODONE-acetaminophen (PERCOCET) 10-325 MG tablet Take 1 tablet by mouth every 8 (eight) hours as needed for pain. Patient taking differently: Take 1 tablet by mouth daily. And as needed every 6 hours  for  chronic pain 04/24/22  Yes Thompson, Daniel V, MD  pantoprazole (PROTONIX) 40 MG tablet TAKE 1 TABLET BY MOUTH EVERY DAY Patient taking differently: Take 40 mg by mouth daily. 04/12/22  Yes Ennever, Peter R, MD  potassium chloride (KLOR-CON) 10 MEQ tablet Take 10 mEq by mouth 2 (two) times daily.   Yes [provider]  sertraline (ZOLOFT) 100 MG tablet TAKE 2 TABLETS(200 MG) BY MOUTH DAILY Patient taking differently: Take 200 mg by mouth daily. 04/20/19  Yes Eubanks, Jessica K, NP  thiamine (VITAMIN B-1) 100 MG tablet Take 1 tablet (100 mg total) by mouth daily. 03/06/22  Yes Jones, Thomas L, MD  Blood Glucose Monitoring Suppl (CONTOUR NEXT EZ MONITOR) w/Device KIT Test blood sugar three times daily E11.22 03/08/16   Miller, Cathey, RPH-CPP    Current Facility-Administered Medications  Medication Dose Route Frequency Provider Last Rate Last Admin   0.9 %  sodium chloride infusion (Manually program via Guardrails IV Fluids)   Intravenous Once Feliz Ortiz, Abraham, MD       0.9 %  sodium chloride infusion   Intravenous Continuous Feliz Ortiz, Abraham, MD 75 mL/hr at 05/31/22 0910 New Bag at 05/31/22 0910   insulin aspart (novoLOG) injection 0-5 Units  0-5 Units Subcutaneous QHS Garba, Mohammad L, MD       insulin aspart (novoLOG) injection 0-9 Units  0-9 Units Subcutaneous TID WC Garba, Mohammad L, MD       ondansetron (ZOFRAN) tablet 4 mg  4 mg Oral Q6H PRN Garba, Mohammad L, MD       Or   ondansetron (ZOFRAN) injection 4 mg  4 mg Intravenous Q6H PRN Garba, Mohammad L, MD       pantoprazole (PROTONIX) injection 40 mg  40 mg Intravenous Q12H Garba, Mohammad L, MD   40 mg at 05/30/22 2359    Allergies as of 05/30/2022   (No Known Allergies)    Family History  Problem Relation Age of Onset   Cancer Mother        unknown type; dx late 70s   Cancer Father        unknown type; dx > 50yo     Colon cancer Brother        dx late 50s   Colon cancer Brother        dx late 50s; dx 68    Cancer Sister        unknown type; dx late 60s   Diabetes Sister    Diabetes Brother    Cancer Maternal Aunt        unknown type; dx > 50yo    Social History   Socioeconomic History   Marital status: Divorced    Spouse name: Not on file   Number of children: 1   Years of education: Not on file   Highest education level: Not on file  Occupational History   Occupation: MAINTENANCE    Employer: [employer]  Tobacco Use   Smoking status: Never   Smokeless tobacco: Never  Vaping Use   Vaping Use: Never used  Substance and Sexual Activity   Alcohol use: No    Alcohol/week: 0.0 standard drinks of alcohol   Drug use: No   Sexual activity: Not Currently    Partners: Female  Other Topics Concern   Not on file  Social History Narrative   Lives alone in a one story home.  Has one daughter.  Works as a maintenance technician.  Education: high school.    Social Determinants of Health   Financial Resource Strain: Not on file  Food Insecurity: No Food Insecurity (04/24/2022)   Hunger Vital Sign    Worried About Running Out of Food in the Last Year: Never true    Ran Out of Food in the Last Year: Never true  Transportation Needs: No Transportation Needs (04/24/2022)   PRAPARE - Transportation    Lack of Transportation (Medical): No    Lack of Transportation (Non-Medical): No  Physical Activity: Not on file  Stress: Not on file  Social Connections: Not on file  Intimate Partner Violence: Not At Risk (04/24/2022)   Humiliation, Afraid, Rape, and Kick questionnaire    Fear of Current or Ex-Partner: No    Emotionally Abused: No    Physically Abused: No    Sexually Abused: No    Review of Systems: Gen: Denies fever, sweats or chills. No weight loss.  CV: Denies chest pain, palpitations or edema. Resp: Denies cough, shortness of breath of hemoptysis.  GI: Denies heartburn, dysphagia, stomach or lower abdominal pain. No diarrhea or constipation. No rectal bleeding or melena.    GU : Denies urinary burning, blood in urine, increased urinary frequency or incontinence. MS:+ Right hip pain. Derm: Denies rash, itchiness, skin lesions or unhealing ulcers. Psych: Denies depression, anxiety, memory loss or confusion. Heme: Denies easy bruising, bleeding. Neuro: + Confusion. Endo:  + DM.  Physical Exam: Vital signs in last 24 hours: Temp:  [97.3 F (36.3 C)-98.7 F (37.1 C)] 98 F (36.7 C) (05/06 0503) Pulse Rate:  [75-111] 79 (05/06 0503) Resp:  [14-27] 18 (05/06 0503) BP: (87-137)/(43-116) 107/69 (05/06 0503) SpO2:  [93 %-100 %] 99 % (05/06 0503) Weight:  [113.4 kg] 113.4 kg (05/05 1515)   General: Fatigued chronically ill appearing 67-year-old male in no acute distress. Head:  Normocephalic and atraumatic. Eyes: Right pupil fixed.  No scleral icterus. Conjunctiva pink. Ears:  Normal auditory acuity. Nose:  No deformity, discharge or lesions. Mouth:  Dentition intact. No ulcers or lesions.  Neck:  Supple. No lymphadenopathy or thyromegaly.  Lungs: Breath sounds clear throughout. No wheezes, rhonchi or crackles.  Heart: Regular rate and rhythm, no murmurs.   Abdomen: Obese abdomen, nondistended.  No ascites.  Mild tenderness to the epigastric and LUQ without rebound or guarding.  Positive bowel sounds to all 4 quadrants.  No palpable mass. Rectal: Addendum: Patient passed a large amount of dark brown soft stool grossly heme positive per beside stool card, 2nd stool card sent to lab. Musculoskeletal:  Symmetrical without gross deformities.  Pulses:  Normal pulses noted. Extremities: Bilateral lower extremity edema. Neurologic:  Alert and  oriented x 4. No focal deficits.  Skin:  Intact without significant lesions or rashes. Psych:  Alert to place. Calm and cooperative.  Intake/Output from previous day: 05/05 0701 - 05/06 0700 In: 978 [I.V.:304; Blood:674] Out: -  Intake/Output this shift: No intake/output data recorded.  Lab Results: Recent Labs     05/30/22 1730 05/31/22 0706  WBC 6.8 5.1  HGB 4.4* 6.4*  HCT 14.5* 19.8*  PLT 83* 57*   BMET Recent Labs    05/30/22 1635 05/31/22 0706  NA 133* 133*  K 4.2 3.7  CL 103 104  CO2 22 23  GLUCOSE 178* 129*  BUN 30* 29*  CREATININE 1.25* 0.96  CALCIUM 7.7* 7.3*   LFT Recent Labs    05/31/22 0706  PROT 5.4*  ALBUMIN 2.0*  AST 105*  ALT 48*  ALKPHOS 92  BILITOT 1.7*   PT/INR No results for input(s): "LABPROT", "INR" in the last 72 hours. Hepatitis Panel No results for input(s): "HEPBSAG", "HCVAB", "HEPAIGM", "HEPBIGM" in the last 72 hours.    Studies/Results: CT HEAD WO CONTRAST (5MM)  Result Date: 05/30/2022 CLINICAL DATA:  Anemia, altered level of consciousness EXAM: CT HEAD WITHOUT CONTRAST TECHNIQUE: Contiguous axial images were obtained from the base of the skull through the vertex without intravenous contrast. RADIATION DOSE REDUCTION: This exam was performed according to the departmental dose-optimization program which includes automated exposure control, adjustment of the mA and/or kV according to patient size and/or use of iterative reconstruction technique. COMPARISON:  05/10/2022 FINDINGS: Brain: No acute infarct or hemorrhage. Lateral ventricles and midline structures are stable. No acute extra-axial fluid collections. No mass effect. Vascular: Stable atherosclerosis.  No hyperdense vessel. Skull: Normal. Negative for fracture or focal lesion. Sinuses/Orbits: Right ocular prosthesis. Mucosal thickening within the ethmoid and frontal sinuses. Other: None. IMPRESSION: 1. No acute intracranial process. Electronically Signed   By: Michael  Brown M.D.   On: 05/30/2022 21:20   DG Chest Port 1 View  Result Date: 05/30/2022 CLINICAL DATA:  Shortness of breath.  Altered mental status. EXAM: PORTABLE CHEST 1 VIEW COMPARISON:  05/10/2022 FINDINGS: Previous median sternotomy and CABG. Pulmonary venous hypertension. Possible mild interstitial edema, small effusions and volume  loss in the lower lungs. IMPRESSION: Suspicion of fluid overload/mild congestive heart failure. Electronically Signed   By: Mark  Shogry M.D.   On: 05/30/2022 16:48    IMPRESSION/PLAN:  67 year old male with acute on chronic anemia. Admission Hg 4.4. On oral iron. No overt GI bleeding. Transfused 2 units of PRBCs -> Hg 6.4. One unit of PRBCs ordered, not yet transfused. EGD 06/17/2021 done by GI in [place] showed Grade I varices and moderate portal hypertensive gastropathy. -Clear liquid diet -Check H/H post transfusion -Transfuse for Hg < 7 -Monitor patient closely for active GI bleeding -Rocephin IV Q 24 hours for SBP/other infection prophylaxis  -Octreotide 50cmg IV bolus followed by 50mc/hr infusion  -Defer endoscopic recommendations to Dr. Kalum Minner   Metabolic encephalopathy, query hepatic encephalopathy.  Normal ammonia level.  -Monitor neurostatus closely -Lactulose 20gm po bid,   titrate to no more than 3-4 loose bowel movements daily  Reported history of cirrhosis with esophageal varices and portal hypertensive  gastropathy. Hep C antibody nonreactive, Hepatitis B surface antigen reactive, Hep B core IgM equivocal on 04/20/2022. -CBC, INR, BMP, Hepatic panel, Hep B surface antibody, Hep B DNA quant in am -RUQ US tentatively schedule tomorrow   Thrombocytopenia, likely due to cirrhosis   GERD -PPI bid  History of colon polyps per colonoscopy done in [place] 06/17/2021  CAD, last dose of ASA and Plavix was at 9am on 5/5  -Continue to hold Plavix   CKD stage III  DM type II   Colleen M Kennedy-Smith  05/31/2022, 11:27 AM   Attending Physician Note   I have taken a history, reviewed the chart and examined the patient. I performed a substantive portion of this encounter, including complete performance of at least one of the key components, in conjunction with the APP. I agree with the APP's note, impression and recommendations with my edits. My additional impressions and  recommendations are as follows.   Acute on chronic anemia  and dark, heme + stools. History of cirrhosis with esophageal varices and portal gastropathy on May 2023 EGD performed in [place]. HBsAg positive and HB core IgM equivocal on 04/20/2022. R/O ulcer, portal gastropathy, AVM, other source.  IV PPI, IV octreotide, IV ceftriaxone. Transfuse to maintain Hb > 7. EGD tomorrow. Repeat HB core IgM, HBsAg, HBsAb and check HBV DNA. Schedule RUQ US.   Metabolic encephalopathy, possible HE. Lactulose bid, titrate for 2-3 loose BMs/day. Primary service to evaluate for other causes.   Thrombocytopenia, likely secondary to cirrhosis  History of colon polyps on May 2023 colonoscopy performed in [place].   GERD  CAD, holding Plavix and ASA   Jalesa Thien, MD FACG See AMION, [facility], for our on call provider    

## 2022-05-31 NOTE — TOC Initial Note (Signed)
Transition of Care [facility]) - Initial/Assessment Note    Patient Details  Name: Barry [name] MRN: [id number] Date of Birth: 1955/07/15  Transition of Care [facility]) CM/SW Contact:    Erin Sons, LCSW Phone Number: 05/31/2022, 3:03 PM  Clinical Narrative:                  Pt called and confirmed with [facility] that pt was a resident there for short term rehab. Pt can return at DC pending insurance auth approval. Pt currently ox2. Fl2 completed and sent in hub. TOC will continue to follow to assist with DC to SNF.  Expected Discharge Plan: Skilled Nursing Facility Barriers to Discharge: Continued Medical Work up   Patient Goals and CMS Choice            Expected Discharge Plan and Services                                              Prior Living Arrangements/Services                       Activities of Daily Living Home Assistive Devices/Equipment: None ADL Screening (condition at time of admission) Patient's cognitive ability adequate to safely complete daily activities?: No Is the patient deaf or have difficulty hearing?: No Does the patient have difficulty seeing, even when wearing glasses/contacts?: No Does the patient have difficulty concentrating, remembering, or making decisions?: Yes Patient able to express need for assistance with ADLs?: No Does the patient have difficulty dressing or bathing?: Yes Independently performs ADLs?: No Communication: Independent Dressing (OT): Needs assistance Is this a change from baseline?: Change from baseline, expected to last <3days Grooming: Needs assistance Is this a change from baseline?: Change from baseline, expected to last <3 days Feeding: Independent Bathing: Needs assistance Is this a change from baseline?: Change from baseline, expected to last <3 days Toileting: Needs assistance Is this a change from baseline?: Change from baseline, expected to last <3 days In/Out Bed: Needs assistance Is  this a change from baseline?: Change from baseline, expected to last <3 days Walks in Home: Independent Does the patient have difficulty walking or climbing stairs?: Yes Weakness of Legs: Both Weakness of Arms/Hands: None  Permission Sought/Granted                  Emotional Assessment       Orientation: : Oriented to Self, Oriented to Place Alcohol / Substance Use: Not Applicable Psych Involvement: No (comment)  Admission diagnosis:  Symptomatic anemia [D64.9] Patient Active Problem List   Diagnosis Date Noted   Symptomatic anemia 05/30/2022   Sleep apnea in adult 04/24/2022   Right hip pain 04/21/2022   Hypertension 04/21/2022   OSA (obstructive sleep apnea) 04/21/2022   AKI (acute kidney injury) (HCC) 04/19/2022   Hypotension 04/18/2022   Acute metabolic encephalopathy 04/18/2022   Manifestations of thiamine deficiency 03/06/2022   Rash and nonspecific skin eruption 03/02/2022   Cirrhosis of liver without ascites (HCC) 03/01/2022   Deficiency anemia 03/01/2022   Chronic seborrheic dermatitis 03/01/2022   Genetic testing 10/30/2019   Family history of colon cancer 10/15/2019   IDA (iron deficiency anemia) 07/14/2018   GERD (gastroesophageal reflux disease) 11/03/2017   Thrombocytopenia (HCC) 08/26/2017   Chronic pain of both shoulders 05/18/2017   Abnormal nuclear stress test  Microalbuminuria due to type 2 diabetes mellitus (HCC) 02/16/2017   Type 2 diabetes mellitus with diabetic neuropathy, with long-term current use of insulin (HCC) 06/20/2015   Chest pain, atypical 03/11/2015   Hyperlipidemia LDL goal <70 03/26/2014   Knee osteoarthritis 09/18/2013   Lumbosacral radiculopathy 09/04/2013   Insomnia 08/14/2013   Essential hypertension, benign 05/15/2013   Erectile dysfunction associated with type 2 diabetes mellitus (HCC) 01/09/2013   Depression with anxiety 09/05/2012   CAD (coronary artery disease) 06/26/2012   Morbid obesity due to excess calories  (HCC) 06/26/2012   Obstructive sleep apnea    PCP:  [name], MD Pharmacy:   [phone] - [place], [place] - [address] AT [facility] [address] [place] [id number] Phone: [phone] Fax: [phone]     Social Determinants of Health (SDOH) Social History: SDOH Screenings   Food Insecurity: No Food Insecurity (04/24/2022)  Housing: Low Risk  (04/24/2022)  Transportation Needs: No Transportation Needs (04/24/2022)  Utilities: Not At Risk (04/24/2022)  Depression (PHQ2-9): Low Risk  (05/16/2019)  Tobacco Use: Low Risk  (05/30/2022)   SDOH Interventions:     Readmission Risk Interventions     No data to display

## 2022-05-31 NOTE — Consult Note (Addendum)
Referring Provider: Dr. Doris Cheadle Primary Care Physician:  [name], MD Primary Gastroenterologist:  Dr. Rob Bunting   Reason for Consultation:  Anemia, cirrhosis   HPI: KAL WASSMUTH is a 67 y.o. male with a past medical history of anxiety, depression, arthritis, CAD s/p MI with stent placement 03/2007, s/p 3 vessel CABG 2014, CVA, DM type II, obstructive sleep apnea, GERD, colon polyps and reported cirrhosis with esophageal varices.   He was admitted from to [facility] from [facility] due to mental status changes an abnormal labs. Hemoglobin level of 4.9 Hg 9.3 on 4/15).  Labs in the ED showed a WBC count of 6.8.  Hemoglobin 4.4.  Hematocrit 14.5.  MCV 109.  Platelet 83.  Sodium 133.  Potassium 4.2.  Glucose 178.  BUN 30.  Creatinine 1.25.  Total bili 1.3.  Alk phos 97.  AST 117.  ALT 50.  Albumin 2.1.  BNP 431.8.  Ammonia 26.  Troponin 139. INR not done.   He was transfused 2 units of PRBCs -> post transfusion H/H 6.4. One unit of PRBCs ordered, not yet transfused. Nursing staff at the bedside at this time to place IV.   I contacted [facility] and spoke to nurse Lance Bosch.  She confirmed the patient's last dose of aspirin and Plavix for 9 AM on Sunday, 05/30/2022.  She denies any knowledge of the the patient passing any bloody or black stools.  He did not demonstrate any evidence of nausea/vomiting or abdominal pain since his admission.  He has frequent right hip pain.   Patient is confused at this time and is unable to provide any accurate history.  I attempted to contact his friend is listed as his contact person without success.  No family contacts listed.  Per prior hospitalist's notes March and April 2024, it was noted the patient underwent an EGD 06/17/2021 with Grade I varices and moderate portal hypertensive gastropathy. However, I am not able to locate any endoscopic records in Epic or Care everywhere.  At that time, it was noted "he had meticulous  records of his medical hx at bedside inside folders". I contacted the patient's PCP Dr. Sanda Linger and he did not have EGD records.  ADDENDUM: Patient's friend at the bedside provide EGD/colonoscopy records as follows: See full report under Media.     He was previously admitted to the hospital 3/24 - 04/24/2022 with altered mental status.  LFTs were elevated with AST 190 and ALT 103. Ammonia level was 54. INR 1.3. Hepatitis B surface antigen was reactive. Hep B IgM was equivocal. Hep A IgM nonreactive. Hep C antibody nonreactive. CT head to have a right globe prosthesis. He was started on lactulose dose and his mental status improved. No further hepatology work up was done. [facility] to [facility] 04/24/20[facility].  Readmitted to the hospital later the same day because the SNF did not have a CPAP machine. He was subsequently discharged home 04/27/2022 when the SNF was  able to provide CPAP.  GI PROCEDURES:  Had endoscopy on 06/17/2021 with Grade I varices and moderate portal hypertensive gastropathy.     Past Medical History:  Diagnosis Date   Anemia    Anxiety    Arthritis    Cerebrovascular disease    Cervical disc disorder    Chronic kidney disease    Cirrhosis of liver (HCC)    Coronary artery disease    Depression    Dyspnea    with exertion  Esophageal varices (HCC)    hx of   Family history of colon cancer 10/15/2019   GERD (gastroesophageal reflux disease)    Headache    Hyperlipidemia    Hyperlipidemia    Hypocalcemia    MI (myocardial infarction) (HCC) 04/23/2007   inferior wall   Morbid obesity (HCC) 06/26/2012   Neuropathy    secondary to diabetes   Pancreatitis    hs of   S/P CABG x 3 07/04/2012   LIMA to LAD, SVG to D1, SVG to PDA, EVH via right thigh   Sarcoidosis of skin    Sleep apnea    no cpap   Stroke (HCC)    small stroke - 2023 - legs weak , righ tleg weak   Type II or unspecified type diabetes mellitus without mention of complication, not stated as  uncontrolled    Unspecified essential hypertension     Past Surgical History:  Procedure Laterality Date   CORONARY ANGIOPLASTY WITH STENT PLACEMENT  04/23/2007   PCI and stenting of mid RCA - Dr Bary Castilla @ [facility]   CORONARY ARTERY BYPASS GRAFT N/A 07/04/2012   Procedure: CORONARY ARTERY BYPASS GRAFTING (CABG);  Surgeon: [name], MD;  Location: [facility] OR;  Service: Open Heart Surgery;  Laterality: N/A;  x3 using right greater saphenous vein and left internal mammary.    INTRAOPERATIVE TRANSESOPHAGEAL ECHOCARDIOGRAM N/A 07/04/2012   Procedure: INTRAOPERATIVE TRANSESOPHAGEAL ECHOCARDIOGRAM;  Surgeon: [name], MD;  Location: [facility] OR;  Service: Open Heart Surgery;  Laterality: N/A;   LEFT HEART CATH AND CORS/GRAFTS ANGIOGRAPHY N/A 04/18/2017   Procedure: LEFT HEART CATH AND CORS/GRAFTS ANGIOGRAPHY;  Surgeon: [name], MD;  Location: [facility] INVASIVE CV LAB;  Service: Cardiovascular;  Laterality: N/A;   LEFT HEART CATHETERIZATION WITH CORONARY ANGIOGRAM N/A 06/25/2012   Procedure: LEFT HEART CATHETERIZATION WITH CORONARY ANGIOGRAM;  Surgeon: [name], MD;  Location: [facility] CATH LAB;  Service: Cardiovascular;  Laterality: N/A;   TOOTH EXTRACTION  04/2018   4 teeth pulled     Prior to Admission medications   Medication Sig Start Date End Date Taking? Authorizing Provider  acetaminophen (TYLENOL) 500 MG tablet Take 1 tablet (500 mg total) by mouth every 8 (eight) hours as needed for moderate pain. 04/24/22  Yes [name], MD  albuterol (PROVENTIL HFA;VENTOLIN HFA) 108 [phone] Base) MCG/ACT inhaler Inhale 2 puffs into the lungs every 6 (six) hours as needed for wheezing or shortness of breath. 04/21/17  Yes Kirt Boys, DO  aspirin 81 MG chewable tablet Chew 81 mg by mouth daily.   Yes [provider]  atorvastatin (LIPITOR) 80 MG tablet Take 1 tablet (80 mg total) by mouth daily. Patient taking differently: Take 80 mg by mouth every evening. 05/08/22  Yes [name],  MD  busPIRone (BUSPAR) 15 MG tablet TAKE 1 TABLET BY MOUTH THREE TIMES DAILY FOR ANXIETY Patient taking differently: Take 15 mg by mouth 3 (three) times daily. 07/19/17  Yes Kirt Boys, DO  cetirizine (ZYRTEC) 10 MG tablet Take 10 mg by mouth daily.   Yes [provider]  clopidogrel (PLAVIX) 75 MG tablet APPOINTMENT OVERDUE Take 1 by mouth daily Patient taking differently: Take 75 mg by mouth daily. 10/31/19  Yes Sharon Seller, NP  cyclobenzaprine (FLEXERIL) 10 MG tablet Take 10 mg by mouth at bedtime. May take an additional 10 mg up to twice daily as needed for pain related to RIGHT HIP, DO NOT GIVE WITHIN 6 HOURS OF SCHEDULED  DOSE 07/23/19  Yes [provider]  DULoxetine (CYMBALTA) 20 MG capsule Take 20 mg by mouth daily.   Yes [provider]  FEROSUL 325 (65 Fe) MG tablet Take 325 mg by mouth daily. 10/13/21  Yes [provider]  fluticasone-salmeterol (ADVAIR) 100-50 MCG/ACT AEPB Inhale 1 puff into the lungs 2 (two) times daily.   Yes [provider]  furosemide (LASIX) 40 MG tablet Take 40 mg by mouth daily.   Yes [provider]  gabapentin (NEURONTIN) 300 MG capsule Take 300 mg by mouth in the morning and at bedtime. 09/12/19  Yes [provider]  lactulose (CHRONULAC) 10 GM/15ML solution Take 15 mLs (10 g total) by mouth 3 (three) times daily. 04/24/22  Yes [name], MD  metFORMIN (GLUCOPHAGE) 1000 MG tablet TAKE 1 TABLET(1000 MG) BY MOUTH TWICE DAILY Patient taking differently: Take 1,000 mg by mouth 2 (two) times daily with a meal. 09/09/17  Yes Kirt Boys, DO  metoprolol tartrate (LOPRESSOR) 25 MG tablet Take 0.5 tablets (12.5 mg total) by mouth 2 (two) times daily. 05/18/17  Yes Kirt Boys, DO  oxyCODONE-acetaminophen (PERCOCET) 10-325 MG tablet Take 1 tablet by mouth every 8 (eight) hours as needed for pain. Patient taking differently: Take 1 tablet by mouth daily. And as needed every 6 hours  for  chronic pain 04/24/22  Yes [name], MD  pantoprazole (PROTONIX) 40 MG tablet TAKE 1 TABLET BY MOUTH EVERY DAY Patient taking differently: Take 40 mg by mouth daily. 04/12/22  Yes Ennever, Rose Phi, MD  potassium chloride (KLOR-CON) 10 MEQ tablet Take 10 mEq by mouth 2 (two) times daily.   Yes [provider]  sertraline (ZOLOFT) 100 MG tablet TAKE 2 TABLETS(200 MG) BY MOUTH DAILY Patient taking differently: Take 200 mg by mouth daily. 04/20/19  Yes Sharon Seller, NP  thiamine (VITAMIN B-1) 100 MG tablet Take 1 tablet (100 mg total) by mouth daily. 03/06/22  Yes [name], MD  Blood Glucose Monitoring Suppl (CONTOUR NEXT EZ MONITOR) w/Device KIT Test blood sugar three times daily E11.22 03/08/16   Edison Pace, RPH-CPP    Current Facility-Administered Medications  Medication Dose Route Frequency Provider Last Rate Last Admin   0.9 %  sodium chloride infusion (Manually program via Guardrails IV Fluids)   Intravenous Once David Stall, Darin Engels, MD       0.9 %  sodium chloride infusion   Intravenous Continuous Marinda Elk, MD 75 mL/hr at 05/31/22 0910 New Bag at 05/31/22 0910   insulin aspart (novoLOG) injection 0-5 Units  0-5 Units Subcutaneous QHS Garba, Mohammad L, MD       insulin aspart (novoLOG) injection 0-9 Units  0-9 Units Subcutaneous TID WC Garba, Mohammad L, MD       ondansetron (ZOFRAN) tablet 4 mg  4 mg Oral Q6H PRN Rometta Emery, MD       Or   ondansetron (ZOFRAN) injection 4 mg  4 mg Intravenous Q6H PRN Rometta Emery, MD       pantoprazole (PROTONIX) injection 40 mg  40 mg Intravenous Q12H Rometta Emery, MD   40 mg at 05/30/22 2359    Allergies as of 05/30/2022   (No Known Allergies)    Family History  Problem Relation Age of Onset   Cancer Mother        unknown type; dx late 72s   Cancer Father        unknown type; dx > 50yo  Colon cancer Brother        dx late 46s   Colon cancer Brother        dx late 34s; dx 53    Cancer Sister        unknown type; dx late 55s   Diabetes Sister    Diabetes Brother    Cancer Maternal Aunt        unknown type; dx > 50yo    Social History   Socioeconomic History   Marital status: Divorced    Spouse name: Not on file   Number of children: 1   Years of education: Not on file   Highest education level: Not on file  Occupational History   Occupation: MAINTENANCE    Employer: [employer]  Tobacco Use   Smoking status: Never   Smokeless tobacco: Never  Vaping Use   Vaping Use: Never used  Substance and Sexual Activity   Alcohol use: No    Alcohol/week: 0.0 standard drinks of alcohol   Drug use: No   Sexual activity: Not Currently    Partners: Female  Other Topics Concern   Not on file  Social History Narrative   Lives alone in a one story home.  Has one daughter.  Works as a Armed forces training and education officer.  Education: high school.    Social Determinants of Health   Financial Resource Strain: Not on file  Food Insecurity: No Food Insecurity (04/24/2022)   Hunger Vital Sign    Worried About Running Out of Food in the Last Year: Never true    Ran Out of Food in the Last Year: Never true  Transportation Needs: No Transportation Needs (04/24/2022)   PRAPARE - Administrator, Civil Service (Medical): No    Lack of Transportation (Non-Medical): No  Physical Activity: Not on file  Stress: Not on file  Social Connections: Not on file  Intimate Partner Violence: Not At Risk (04/24/2022)   Humiliation, Afraid, Rape, and Kick questionnaire    Fear of Current or Ex-Partner: No    Emotionally Abused: No    Physically Abused: No    Sexually Abused: No    Review of Systems: Gen: Denies fever, sweats or chills. No weight loss.  CV: Denies chest pain, palpitations or edema. Resp: Denies cough, shortness of breath of hemoptysis.  GI: Denies heartburn, dysphagia, stomach or lower abdominal pain. No diarrhea or constipation. No rectal bleeding or melena.    GU : Denies urinary burning, blood in urine, increased urinary frequency or incontinence. MS:+ Right hip pain. Derm: Denies rash, itchiness, skin lesions or unhealing ulcers. Psych: Denies depression, anxiety, memory loss or confusion. Heme: Denies easy bruising, bleeding. Neuro: + Confusion. Endo:  + DM.  Physical Exam: Vital signs in last 24 hours: Temp:  [97.3 F (36.3 C)-98.7 F (37.1 C)] 98 F (36.7 C) (05/06 0503) Pulse Rate:  [75-111] 79 (05/06 0503) Resp:  [14-27] 18 (05/06 0503) BP: (87-137)/(43-116) 107/69 (05/06 0503) SpO2:  [93 %-100 %] 99 % (05/06 0503) Weight:  [113.4 kg] 113.4 kg (05/05 1515)   General: Fatigued chronically ill appearing 67 year old male in no acute distress. Head:  Normocephalic and atraumatic. Eyes: Right pupil fixed.  No scleral icterus. Conjunctiva pink. Ears:  Normal auditory acuity. Nose:  No deformity, discharge or lesions. Mouth:  Dentition intact. No ulcers or lesions.  Neck:  Supple. No lymphadenopathy or thyromegaly.  Lungs: Breath sounds clear throughout. No wheezes, rhonchi or crackles.  Heart: Regular rate and rhythm, no murmurs.  Abdomen: Obese abdomen, nondistended.  No ascites.  Mild tenderness to the epigastric and LUQ without rebound or guarding.  Positive bowel sounds to all 4 quadrants.  No palpable mass. Rectal: Addendum: Patient passed a large amount of dark brown soft stool grossly heme positive per beside stool card, 2nd stool card sent to lab. Musculoskeletal:  Symmetrical without gross deformities.  Pulses:  Normal pulses noted. Extremities: Bilateral lower extremity edema. Neurologic:  Alert and  oriented x 4. No focal deficits.  Skin:  Intact without significant lesions or rashes. Psych:  Alert to place. Calm and cooperative.  Intake/Output from previous day: 05/05 0701 - 05/06 0700 In: 978 [I.V.:304; Blood:674] Out: -  Intake/Output this shift: No intake/output data recorded.  Lab Results: Recent Labs     05/30/22 1730 05/31/22 0706  WBC 6.8 5.1  HGB 4.4* 6.4*  HCT 14.5* 19.8*  PLT 83* 57*   BMET Recent Labs    05/30/22 1635 05/31/22 0706  NA 133* 133*  K 4.2 3.7  CL 103 104  CO2 22 23  GLUCOSE 178* 129*  BUN 30* 29*  CREATININE 1.25* 0.96  CALCIUM 7.7* 7.3*   LFT Recent Labs    05/31/22 0706  PROT 5.4*  ALBUMIN 2.0*  AST 105*  ALT 48*  ALKPHOS 92  BILITOT 1.7*   PT/INR No results for input(s): "LABPROT", "INR" in the last 72 hours. Hepatitis Panel No results for input(s): "HEPBSAG", "HCVAB", "HEPAIGM", "HEPBIGM" in the last 72 hours.    Studies/Results: CT HEAD WO CONTRAST ( )  Result Date: 05/30/2022 CLINICAL DATA:  Anemia, altered level of consciousness EXAM: CT HEAD WITHOUT CONTRAST TECHNIQUE: Contiguous axial images were obtained from the base of the skull through the vertex without intravenous contrast. RADIATION DOSE REDUCTION: This exam was performed according to the departmental dose-optimization program which includes automated exposure control, adjustment of the mA and/or kV according to patient size and/or use of iterative reconstruction technique. COMPARISON:  05/10/2022 FINDINGS: Brain: No acute infarct or hemorrhage. Lateral ventricles and midline structures are stable. No acute extra-axial fluid collections. No mass effect. Vascular: Stable atherosclerosis.  No hyperdense vessel. Skull: Normal. Negative for fracture or focal lesion. Sinuses/Orbits: Right ocular prosthesis. Mucosal thickening within the ethmoid and frontal sinuses. Other: None. IMPRESSION: 1. No acute intracranial process. Electronically Signed   By: Sharlet Salina M.D.   On: 05/30/2022 21:20   DG Chest Port 1 View  Result Date: 05/30/2022 CLINICAL DATA:  Shortness of breath.  Altered mental status. EXAM: PORTABLE CHEST 1 VIEW COMPARISON:  05/10/2022 FINDINGS: Previous median sternotomy and CABG. Pulmonary venous hypertension. Possible mild interstitial edema, small effusions and volume  loss in the lower lungs. IMPRESSION: Suspicion of fluid overload/mild congestive heart failure. Electronically Signed   By: Paulina Fusi M.D.   On: 05/30/2022 16:48    IMPRESSION/PLAN:  67 year old male with acute on chronic anemia. Admission Hg 4.4. On oral iron. No overt GI bleeding. Transfused 2 units of PRBCs -> Hg 6.4. One unit of PRBCs ordered, not yet transfused. EGD 06/17/2021 done by GI in [place] showed Grade I varices and moderate portal hypertensive gastropathy. -Clear liquid diet -Check H/H post transfusion -Transfuse for Hg < 7 -Monitor patient closely for active GI bleeding -Rocephin IV Q 24 hours for SBP/other infection prophylaxis  -Octreotide 50cmg IV bolus followed by 21mc/hr infusion  -Defer endoscopic recommendations to Dr. Russella Dar   Metabolic encephalopathy, query hepatic encephalopathy.  Normal ammonia level.  -Monitor neurostatus closely -Lactulose 20gm po bid,  titrate to no more than 3-4 loose bowel movements daily  Reported history of cirrhosis with esophageal varices and portal hypertensive  gastropathy. Hep C antibody nonreactive, Hepatitis B surface antigen reactive, Hep B core IgM equivocal on 04/20/2022. -CBC, INR, BMP, Hepatic panel, Hep B surface antibody, Hep B DNA quant in am -RUQ [place] tentatively schedule tomorrow   Thrombocytopenia, likely due to cirrhosis   GERD -PPI bid  History of colon polyps per colonoscopy done in [place] 06/17/2021  CAD, last dose of ASA and Plavix was at 9am on 5/5  -Continue to hold Plavix   CKD stage III  DM type II   Arnaldo Natal  05/31/2022, 11:27 AM   Attending Physician Note   I have taken a history, reviewed the chart and examined the patient. I performed a substantive portion of this encounter, including complete performance of at least one of the key components, in conjunction with the APP. I agree with the APP's note, impression and recommendations with my edits. My additional impressions and  recommendations are as follows.   Acute on chronic anemia  and dark, heme + stools. History of cirrhosis with esophageal varices and portal gastropathy on May 2023 EGD performed in [place]. HBsAg positive and HB core IgM equivocal on 04/20/2022. R/O ulcer, portal gastropathy, AVM, other source.  IV PPI, IV octreotide, IV ceftriaxone. Transfuse to maintain Hb > 7. EGD tomorrow. Repeat HB core IgM, HBsAg, HBsAb and check HBV DNA. Schedule RUQ [place].   Metabolic encephalopathy, possible HE. Lactulose bid, titrate for 2-3 loose BMs/day. Primary service to evaluate for other causes.   Thrombocytopenia, likely secondary to cirrhosis  History of colon polyps on May 2023 colonoscopy performed in [place].   GERD  CAD, holding Plavix and ASA   Claudette Head, MD [facility] See Loretha Stapler, [facility], for our on call provider

## 2022-05-31 NOTE — NC FL2 (Signed)
[place] MEDICAID FL2 LEVEL OF CARE FORM     IDENTIFICATION  Patient Name: Barry [name] Birthdate: February 21, 1955 Sex: male Admission Date (Current Location): 05/30/2022  [facility] and [place] Number:  Producer, television/film/video and Address:  [facility], [address], [place], [place] [address]      Provider Number: [id number]  Attending Physician Name and Address:  Marinda Elk, MD  Relative Name and Phone Number:       Current Level of Care: Hospital Recommended Level of Care: Skilled Nursing Facility Prior Approval Number:    Date Approved/Denied:   PASRR Number: [id number]  Discharge Plan: SNF    Current Diagnoses: Patient Active Problem List   Diagnosis Date Noted   Symptomatic anemia 05/30/2022   Sleep apnea in adult 04/24/2022   Right hip pain 04/21/2022   Hypertension 04/21/2022   OSA (obstructive sleep apnea) 04/21/2022   AKI (acute kidney injury) (HCC) 04/19/2022   Hypotension 04/18/2022   Acute metabolic encephalopathy 04/18/2022   Manifestations of thiamine deficiency 03/06/2022   Rash and nonspecific skin eruption 03/02/2022   Cirrhosis of liver without ascites (HCC) 03/01/2022   Deficiency anemia 03/01/2022   Chronic seborrheic dermatitis 03/01/2022   Genetic testing 10/30/2019   Family history of colon cancer 10/15/2019   IDA (iron deficiency anemia) 07/14/2018   GERD (gastroesophageal reflux disease) 11/03/2017   Thrombocytopenia (HCC) 08/26/2017   Chronic pain of both shoulders 05/18/2017   Abnormal nuclear stress test    Microalbuminuria due to type 2 diabetes mellitus (HCC) 02/16/2017   Type 2 diabetes mellitus with diabetic neuropathy, with long-term current use of insulin (HCC) 06/20/2015   Chest pain, atypical 03/11/2015   Hyperlipidemia LDL goal <70 03/26/2014   Knee osteoarthritis 09/18/2013   Lumbosacral radiculopathy 09/04/2013   Insomnia 08/14/2013   Essential hypertension, benign 05/15/2013   Erectile  dysfunction associated with type 2 diabetes mellitus (HCC) 01/09/2013   Depression with anxiety 09/05/2012   CAD (coronary artery disease) 06/26/2012   Morbid obesity due to excess calories (HCC) 06/26/2012   Obstructive sleep apnea     Orientation RESPIRATION BLADDER Height & Weight     Self, Place  Normal Incontinent, External catheter Weight: 250 lb (113.4 kg) Height:  5\' 4"  (162.6 cm)  BEHAVIORAL SYMPTOMS/MOOD NEUROLOGICAL BOWEL NUTRITION STATUS      Continent Diet (see d/c summary)  AMBULATORY STATUS COMMUNICATION OF NEEDS Skin   Extensive Assist Verbally Normal                       Personal Care Assistance Level of Assistance  Bathing, Feeding, Dressing Bathing Assistance: Limited assistance Feeding assistance: Independent Dressing Assistance: Limited assistance     Functional Limitations Info  Sight, Hearing, Speech Sight Info: Adequate Hearing Info: Adequate Speech Info: Adequate    SPECIAL CARE FACTORS FREQUENCY  PT (By licensed PT), OT (By licensed OT)     PT Frequency: 5x/week OT Frequency: 5x/week            Contractures Contractures Info: Not present    Additional Factors Info  Code Status, Allergies Code Status Info: Full code Allergies Info: no known allergies           Current Medications (05/31/2022):  This is the current hospital active medication list Current Facility-Administered Medications  Medication Dose Route Frequency Provider Last Rate Last Admin   0.9 %  sodium chloride infusion   Intravenous Continuous Marinda Elk, MD 75 mL/hr at  05/31/22 0910 New Bag at 05/31/22 0910   cefTRIAXone (ROCEPHIN) 2 g in sodium chloride 0.9 % 100 mL IVPB  2 g Intravenous Q24H Kennedy-Smith, Colleen M, NP       insulin aspart (novoLOG) injection 0-5 Units  0-5 Units Subcutaneous QHS Mikeal Hawthorne, Mohammad L, MD       insulin aspart (novoLOG) injection 0-9 Units  0-9 Units Subcutaneous TID WC Rometta Emery, MD   2 Units at 05/31/22 1221    lactulose (CHRONULAC) 10 GM/15ML solution 20 g  20 g Oral BID Alcide Evener M, NP   20 g at 05/31/22 1452   octreotide (SANDOSTATIN) 2 mcg/mL load via infusion 50 mcg  50 mcg Intravenous Once Arnaldo Natal, NP       And   octreotide (SANDOSTATIN) 500 mcg in sodium chloride 0.9 % 250 mL (2 mcg/mL) infusion  50 mcg/hr Intravenous Continuous Arnaldo Natal, NP       ondansetron (ZOFRAN) tablet 4 mg  4 mg Oral Q6H PRN Rometta Emery, MD       Or   ondansetron (ZOFRAN) injection 4 mg  4 mg Intravenous Q6H PRN Rometta Emery, MD       oxyCODONE (Oxy IR/ROXICODONE) immediate release tablet 5 mg  5 mg Oral Q4H PRN Marinda Elk, MD   5 mg at 05/31/22 1307   pantoprazole (PROTONIX) injection 40 mg  40 mg Intravenous Q12H Rometta Emery, MD   40 mg at 05/31/22 1118     Discharge Medications: Please see discharge summary for a list of discharge medications.  Relevant Imaging Results:  Relevant Lab Results:   Additional Information SSN [id number]  [facility] [place], [place]

## 2022-05-31 NOTE — Plan of Care (Signed)

## 2022-05-31 NOTE — Anesthesia Preprocedure Evaluation (Signed)
Anesthesia Evaluation  Patient identified by MRN, date of birth, ID band Patient awake    Reviewed: Allergy & Precautions, NPO status , Patient's Chart, lab work & pertinent test results  Airway Mallampati: II  TM Distance: >3 FB Neck ROM: Full    Dental no notable dental hx. (+) Poor Dentition, Teeth Intact   Pulmonary shortness of breath, COPD,  COPD inhaler   Pulmonary exam normal breath sounds clear to auscultation       Cardiovascular hypertension, + CAD and + Past MI  Normal cardiovascular exam Rhythm:Regular Rate:Normal  04/09/2022 TTE  1. Left ventricular ejection fraction, by estimation, is >75%. The left  ventricle has hyperdynamic function. The left ventricle has no regional  wall motion abnormalities. There is mild asymmetric left ventricular  hypertrophy of the basal-septal segment.   Indeterminate diastolic filling due to E-A fusion.   2. Right ventricular systolic function is normal. The right ventricular  size is normal. There is normal pulmonary artery systolic pressure. The  estimated right ventricular systolic pressure is 33.5 mmHg.   3. No evidence of mitral valve regurgitation.   4. Aortic valve regurgitation is not visualized.   5. The inferior vena cava is normal in size with greater than 50%  respiratory variability, suggesting right atrial pressure of 3 mmHg.      Neuro/Psych  PSYCHIATRIC DISORDERS Anxiety Depression     Neuromuscular disease CVA    GI/Hepatic ,GERD  Medicated,,(+) Cirrhosis   Esophageal Varices    On octreotide  Lab Results      Component                Value               Date                      ALT                      48 (H)              05/31/2022                AST                      105 (H)             05/31/2022                ALKPHOS                  92                  05/31/2022                BILITOT                  1.7 (H)             05/31/2022               Endo/Other  diabetes, Type 2, Oral Hypoglycemic Agents    Renal/GU Renal InsufficiencyRenal diseaseLab Results      Component                Value               Date                      CREATININE  0.96                05/31/2022                BUN                      29 (H)              05/31/2022                NA                       133 (L)             05/31/2022                K                        3.7                 05/31/2022                  Musculoskeletal  (+) Arthritis ,    Abdominal  (+) + obese (BMI 42.91)  Peds  Hematology  (+) Blood dyscrasia, anemia On Plavix Lab Results      Component                Value               Date                      WBC                      5.1                 05/31/2022                HGB                      6.4 (LL)            05/31/2022                HCT                      19.8 (L)            05/31/2022                MCV                      103.7 (H)           05/31/2022                PLT                      57 (L)              05/31/2022              Anesthesia Other Findings   Reproductive/Obstetrics                             Anesthesia Physical Anesthesia Plan  ASA: 4  Anesthesia Plan: MAC   Post-op Pain Management: Minimal or no pain anticipated   Induction:  PONV Risk Score and Plan: Propofol infusion and Treatment may vary due to age or medical condition  Airway Management Planned: Natural Airway and Nasal Cannula  Additional Equipment: None  Intra-op Plan:   Post-operative Plan:   Informed Consent:      Dental advisory given  Plan Discussed with:   Anesthesia Plan Comments:         Anesthesia Quick Evaluation

## 2022-05-31 NOTE — Progress Notes (Signed)
[facility] PROGRESS NOTE    Progress Note  Barry [name]  ZOX:[id number] DOB: Apr 12, 1955 DOA: 05/30/2022 PCP: [name], MD     Brief Narrative:   Barry [name] is an 67 y.o. male past medical history significant for liver cirrhosis, CVA chronic kidney disease stage III AA brought in from skilled nursing facility for altered mental status and hemoglobin of 4 (last hemoglobin on 05/10/2022 was 9.3), FOBT was positive but denies any melanotic stools.    Assessment/Plan:   Symptomatic anemia/acute blood loss anemia: Status post 2 units packed red blood cells hemoglobin posttransfusion was pending. GI was consulted as there was a concern for esophageal variceal bleed was started on IV Protonix. He denies any melanotic stools.  Acute kidney injury: Possibly prerenal azotemia, with a previous hemoglobin of less than 1 on admission 1.2, currently getting 2 units packed red blood cells which will help with volume expansion.  Continue the fluids creatinine has returned to baseline.  Liver cirrhosis: No ascites, ammonia level 26.  Acute metabolic encephalopathy: Likely due to drop in hemoglobin.  CT head showed no acute findings. Still, uses morning cannot provide a history  Hypovolemic hyponatremia: Currently getting 2 packs of red blood cells, there was help with volume expansion sodium this morning is pending.  Elevated troponins: He denies any chest pain, he did relate some shortness of breath but his troponins are trending down there is likely due to drop in hemoglobin likely demand ischemia.  Thrombocytopenia: Secondary to liver cirrhosis they are trending up.  Essential hypertension: Continue to hold antihypertensive medication.  GERD: Continue PPI.  Obstructive sleep apnea: Continue CPAP at night.  Hyperlipidemia:  continue statins.  DVT prophylaxis: scd Family Communication:none Status is: Inpatient Remains inpatient appropriate because: Acute GI  bleed    Code Status:     Code Status Orders  (From admission, onward)           Start     Ordered   05/30/22 1858  Full code  Continuous       Question:  By:  Answer:  Consent: discussion documented in EHR   05/30/22 1858           Code Status History     Date Active Date Inactive Code Status Order ID Comments User Context   04/24/2022 1617 04/28/2022 1539 Full Code [id number]  [name], MD ED   04/19/2022 0004 04/24/2022 1454 Full Code [id number]  [name], DO ED   04/18/2017 1002 04/18/2017 1747 Full Code [id number]  [name], MD Inpatient   07/05/2012 0825 07/10/2012 1936 Full Code [id number]  [name], MD Inpatient   07/04/2012 1417 07/05/2012 0825 Full Code [id number]  [name], MD Inpatient         IV Access:   Peripheral IV   Procedures and diagnostic studies:   CT HEAD WO CONTRAST ( )  Result Date: 05/30/2022 CLINICAL DATA:  Anemia, altered level of consciousness EXAM: CT HEAD WITHOUT CONTRAST TECHNIQUE: Contiguous axial images were obtained from the base of the skull through the vertex without intravenous contrast. RADIATION DOSE REDUCTION: This exam was performed according to the departmental dose-optimization program which includes automated exposure control, adjustment of the mA and/or kV according to patient size and/or use of iterative reconstruction technique. COMPARISON:  05/10/2022 FINDINGS: Brain: No acute infarct or hemorrhage. Lateral ventricles and midline structures are stable. No acute extra-axial fluid collections. No mass effect. Vascular: Stable atherosclerosis.  No hyperdense vessel. Skull:  Normal. Negative for fracture or focal lesion. Sinuses/Orbits: Right ocular prosthesis. Mucosal thickening within the ethmoid and frontal sinuses. Other: None. IMPRESSION: 1. No acute intracranial process. Electronically Signed   By: Sharlet Salina M.D.   On: 05/30/2022 21:20   DG Chest Port 1 View  Result Date: 05/30/2022 CLINICAL  DATA:  Shortness of breath.  Altered mental status. EXAM: PORTABLE CHEST 1 VIEW COMPARISON:  05/10/2022 FINDINGS: Previous median sternotomy and CABG. Pulmonary venous hypertension. Possible mild interstitial edema, small effusions and volume loss in the lower lungs. IMPRESSION: Suspicion of fluid overload/mild congestive heart failure. Electronically Signed   By: Paulina Fusi M.D.   On: 05/30/2022 16:48     Medical Consultants:   None.   Subjective:    Barry [name] is slightly confused this morning.  Objective:    Vitals:   05/30/22 2332 05/30/22 2348 05/31/22 0231 05/31/22 0503  BP: (!) 88/69 (!) 137/116 114/65 107/69  Pulse: 80 (!) 106 75 79  Resp: 20 16 14 18   Temp: 98.7 F (37.1 C) (!) 97.3 F (36.3 C) 97.7 F (36.5 C) 98 F (36.7 C)  TempSrc:  Oral Oral   SpO2: 97% 93% 99% 99%  Weight:      Height:       SpO2: 99 %   Intake/Output Summary (Last 24 hours) at 05/31/2022 0740 Last data filed at 05/31/2022 0230 Gross per 24 hour  Intake 978.04 ml  Output --  Net 978.04 ml   Filed Weights   05/30/22 1515  Weight: 113.4 kg    Exam: General exam: In no acute distress. Respiratory system: Good air movement and clear to auscultation. Cardiovascular system: S1 & S2 heard, RRR. No JVD.  Gastrointestinal system: Abdomen is nondistended, soft and nontender.  Extremities: No pedal edema. Skin: No rashes, lesions or ulcers Psychiatry: No judgment or insight of medical condition.   Data Reviewed:    Labs: Basic Metabolic Panel: Recent Labs  Lab 05/30/22 1635  NA 133*  K 4.2  CL 103  CO2 22  GLUCOSE 178*  BUN 30*  CREATININE 1.25*  CALCIUM 7.7*   GFR Estimated Creatinine Clearance: 67.4 mL/min (A) (by C-G formula based on SCr of 1.25 mg/dL (H)). Liver Function Tests: Recent Labs  Lab 05/30/22 1635  AST 117*  ALT 50*  ALKPHOS 97  BILITOT 1.3*  PROT 5.9*  ALBUMIN 2.1*   No results for input(s): "LIPASE", "AMYLASE" in the last 168 hours. Recent Labs   Lab 05/30/22 1631  AMMONIA 26   Coagulation profile No results for input(s): "INR", "PROTIME" in the last 168 hours. COVID-19 Labs  No results for input(s): "DDIMER", "FERRITIN", "LDH", "CRP" in the last 72 hours.  Lab Results  Component Value Date   SARSCOV2NAA NEGATIVE 05/10/2022    CBC: Recent Labs  Lab 05/30/22 1730  WBC 6.8  NEUTROABS 3.7  HGB 4.4*  HCT 14.5*  MCV 109.0*  PLT 83*   Cardiac Enzymes: No results for input(s): "CKTOTAL", "CKMB", "CKMBINDEX", "TROPONINI" in the last 168 hours. BNP (last 3 results) No results for input(s): "PROBNP" in the last 8760 hours. CBG: Recent Labs  Lab 05/30/22 2154  GLUCAP 128*   D-Dimer: No results for input(s): "DDIMER" in the last 72 hours. Hgb A1c: No results for input(s): "HGBA1C" in the last 72 hours. Lipid Profile: No results for input(s): "CHOL", "HDL", "LDLCALC", "TRIG", "CHOLHDL", "LDLDIRECT" in the last 72 hours. Thyroid function studies: No results for input(s): "TSH", "T4TOTAL", "T3FREE", "THYROIDAB" in the last  72 hours.  Invalid input(s): "FREET3" Anemia work up: No results for input(s): "VITAMINB12", "FOLATE", "FERRITIN", "TIBC", "IRON", "RETICCTPCT" in the last 72 hours. Sepsis Labs: Recent Labs  Lab 05/30/22 1730  WBC 6.8   Microbiology No results found for this or any previous visit (from the past 240 hour(s)).   Medications:    insulin aspart  0-5 Units Subcutaneous QHS   insulin aspart  0-9 Units Subcutaneous TID WC   pantoprazole (PROTONIX) IV  40 mg Intravenous Q12H   Continuous Infusions:  lactated ringers 125 mL/hr at 05/31/22 0425      LOS: 1 day   Marinda Elk  [facility]  05/31/2022, 7:40 AM

## 2022-06-01 ENCOUNTER — Encounter (HOSPITAL_COMMUNITY): Payer: Self-pay | Admitting: Internal Medicine

## 2022-06-01 ENCOUNTER — Inpatient Hospital Stay (HOSPITAL_COMMUNITY): Payer: Medicare HMO

## 2022-06-01 ENCOUNTER — Inpatient Hospital Stay (HOSPITAL_COMMUNITY): Payer: Medicare HMO | Admitting: Anesthesiology

## 2022-06-01 ENCOUNTER — Encounter (HOSPITAL_COMMUNITY)
Admission: EM | Disposition: A | Discharge status: Skilled Nursing Facility | Payer: Self-pay | Source: Skilled Nursing Facility | Attending: Internal Medicine

## 2022-06-01 DIAGNOSIS — R195 Other fecal abnormalities: Secondary | ICD-10-CM

## 2022-06-01 DIAGNOSIS — K219 Gastro-esophageal reflux disease without esophagitis: Secondary | ICD-10-CM | POA: Diagnosis not present

## 2022-06-01 DIAGNOSIS — K7460 Unspecified cirrhosis of liver: Secondary | ICD-10-CM | POA: Diagnosis not present

## 2022-06-01 DIAGNOSIS — D62 Acute posthemorrhagic anemia: Secondary | ICD-10-CM | POA: Diagnosis not present

## 2022-06-01 DIAGNOSIS — K766 Portal hypertension: Secondary | ICD-10-CM | POA: Diagnosis not present

## 2022-06-01 DIAGNOSIS — I8500 Esophageal varices without bleeding: Secondary | ICD-10-CM | POA: Diagnosis not present

## 2022-06-01 DIAGNOSIS — I8510 Secondary esophageal varices without bleeding: Secondary | ICD-10-CM | POA: Diagnosis not present

## 2022-06-01 DIAGNOSIS — J449 Chronic obstructive pulmonary disease, unspecified: Secondary | ICD-10-CM

## 2022-06-01 DIAGNOSIS — R188 Other ascites: Secondary | ICD-10-CM | POA: Diagnosis not present

## 2022-06-01 DIAGNOSIS — D649 Anemia, unspecified: Secondary | ICD-10-CM | POA: Diagnosis not present

## 2022-06-01 DIAGNOSIS — I2510 Atherosclerotic heart disease of native coronary artery without angina pectoris: Secondary | ICD-10-CM

## 2022-06-01 DIAGNOSIS — G9341 Metabolic encephalopathy: Secondary | ICD-10-CM | POA: Diagnosis not present

## 2022-06-01 DIAGNOSIS — N179 Acute kidney failure, unspecified: Secondary | ICD-10-CM | POA: Diagnosis not present

## 2022-06-01 DIAGNOSIS — K3189 Other diseases of stomach and duodenum: Secondary | ICD-10-CM | POA: Diagnosis not present

## 2022-06-01 DIAGNOSIS — I10 Essential (primary) hypertension: Secondary | ICD-10-CM

## 2022-06-01 HISTORY — PX: ESOPHAGOGASTRODUODENOSCOPY (EGD) WITH PROPOFOL: SHX5813

## 2022-06-01 LAB — BASIC METABOLIC PANEL
Anion gap: 7 (ref 5–15)
BUN: 18 mg/dL (ref 8–23)
CO2: 24 mmol/L (ref 22–32)
Calcium: 7.2 mg/dL — ABNORMAL LOW (ref 8.9–10.3)
Chloride: 102 mmol/L (ref 98–111)
Creatinine, Ser: 0.96 mg/dL (ref 0.61–1.24)
GFR, Estimated: >60 mL/min (ref >60)
Glucose, Bld: 156 mg/dL — ABNORMAL HIGH (ref 70–99)
Potassium: 3.5 mmol/L (ref 3.5–5.1)
Sodium: 133 mmol/L — ABNORMAL LOW (ref 135–145)

## 2022-06-01 LAB — GLUCOSE, CAPILLARY
Glucose-Capillary: 146 mg/dL — ABNORMAL HIGH (ref 70–99)
Glucose-Capillary: 128 mg/dL — ABNORMAL HIGH (ref 70–99)
Glucose-Capillary: 115 mg/dL — ABNORMAL HIGH (ref 70–99)
Glucose-Capillary: 169 mg/dL — ABNORMAL HIGH (ref 70–99)

## 2022-06-01 LAB — CBC
HCT: 25.2 % — ABNORMAL LOW (ref 39.0–52.0)
HCT: 25.6 % — ABNORMAL LOW (ref 39.0–52.0)
Hemoglobin: 8.4 g/dL — ABNORMAL LOW (ref 13.0–17.0)
Hemoglobin: 8.3 g/dL — ABNORMAL LOW (ref 13.0–17.0)
MCH: 32.3 pg (ref 26.0–34.0)
MCH: 32.3 pg (ref 26.0–34.0)
MCHC: 33.3 g/dL (ref 30.0–36.0)
MCHC: 32.4 g/dL (ref 30.0–36.0)
MCV: 96.9 fL (ref 80.0–100.0)
MCV: 99.6 fL (ref 80.0–100.0)
Platelets: 52 10*3/uL — ABNORMAL LOW (ref 150–400)
Platelets: 49 10*3/uL — ABNORMAL LOW (ref 150–400)
RBC: 2.6 MIL/uL — ABNORMAL LOW (ref 4.22–5.81)
RBC: 2.57 MIL/uL — ABNORMAL LOW (ref 4.22–5.81)
RDW: 22.5 % — ABNORMAL HIGH (ref 11.5–15.5)
RDW: 22.4 % — ABNORMAL HIGH (ref 11.5–15.5)
WBC: 4.5 10*3/uL (ref 4.0–10.5)
WBC: 4.4 10*3/uL (ref 4.0–10.5)
nRBC: 0 % (ref 0.0–0.2)
nRBC: 0 % (ref 0.0–0.2)

## 2022-06-01 LAB — HEPATITIS B CORE ANTIBODY, TOTAL: Hep B Core Total Ab: REACTIVE — AB

## 2022-06-01 LAB — BPAM RBC
Blood Product Expiration Date: 202405282359
Blood Product Expiration Date: 202405282359
Blood Product Expiration Date: 202405292359
ISSUE DATE / TIME: 202405061242
Unit Type and Rh: 6200
Unit Type and Rh: 6200

## 2022-06-01 LAB — TYPE AND SCREEN
Unit division: 0
Unit division: 0
Unit division: 0

## 2022-06-01 LAB — HEPATIC FUNCTION PANEL
ALT: 48 U/L — ABNORMAL HIGH (ref 0–44)
AST: 103 U/L — ABNORMAL HIGH (ref 15–41)
Albumin: 2.1 g/dL — ABNORMAL LOW (ref 3.5–5.0)
Alkaline Phosphatase: 97 U/L (ref 38–126)
Bilirubin, Direct: 0.4 mg/dL — ABNORMAL HIGH (ref 0.0–0.2)
Indirect Bilirubin: 1.2 mg/dL — ABNORMAL HIGH (ref 0.3–0.9)
Total Bilirubin: 1.6 mg/dL — ABNORMAL HIGH (ref 0.3–1.2)
Total Protein: 5.7 g/dL — ABNORMAL LOW (ref 6.5–8.1)

## 2022-06-01 LAB — PROTIME-INR
INR: 1.4 — ABNORMAL HIGH (ref 0.8–1.2)
Prothrombin Time: 17.2 seconds — ABNORMAL HIGH (ref 11.4–15.2)

## 2022-06-01 LAB — HEPATITIS B SURFACE ANTIBODY,QUALITATIVE: Hep B S Ab: NONREACTIVE

## 2022-06-01 LAB — HEPATITIS B SURFACE ANTIGEN: Hepatitis B Surface Ag: REACTIVE — AB

## 2022-06-01 SURGERY — ESOPHAGOGASTRODUODENOSCOPY (EGD) WITH PROPOFOL
Anesthesia: Monitor Anesthesia Care

## 2022-06-01 MED ORDER — LACTATED RINGERS IV SOLN: INTRAVENOUS | Status: DC

## 2022-06-01 MED ORDER — SODIUM CHLORIDE (PF) 0.9 % IJ SOLN
INTRAMUSCULAR | Status: AC
  Filled 2022-06-01: qty 50

## 2022-06-01 MED ORDER — POTASSIUM CHLORIDE IN NACL 40-0.9 MEQ/L-% IV SOLN
INTRAVENOUS | Status: DC
  Filled 2022-06-01 (×3): qty 1000

## 2022-06-01 MED ORDER — PROPOFOL 500 MG/50ML IV EMUL
INTRAVENOUS | Status: DC | PRN
  Administered 2022-06-01: 150 ug/kg/min via INTRAVENOUS

## 2022-06-01 MED ORDER — PHENOL 1.4 % MT LIQD
1.0000 | OROMUCOSAL | Status: DC | PRN
  Filled 2022-06-01: qty 177

## 2022-06-01 MED ORDER — PROPOFOL 10 MG/ML IV BOLUS
INTRAVENOUS | Status: DC | PRN
  Administered 2022-06-01: 50 mg via INTRAVENOUS

## 2022-06-01 MED ORDER — SODIUM CHLORIDE 0.9 % IV SOLN: INTRAVENOUS | Status: DC

## 2022-06-01 MED ORDER — IOHEXOL 350 MG/ML SOLN
100.0000 mL | Freq: Once | INTRAVENOUS | Status: AC | PRN
  Administered 2022-06-01: 100 mL via INTRAVENOUS

## 2022-06-01 SURGICAL SUPPLY — 15 items
BLOCK BITE 60FR ADLT L/F BLUE (MISCELLANEOUS) ×1 IMPLANT
ELECT REM PT RETURN 9FT ADLT (ELECTROSURGICAL)
ELECTRODE REM PT RTRN 9FT ADLT (ELECTROSURGICAL) IMPLANT
FORCEP RJ3 GP 1.8X160 W-NEEDLE (CUTTING FORCEPS) IMPLANT
FORCEPS BIOP RAD 4 LRG CAP 4 (CUTTING FORCEPS) IMPLANT
NDL SCLEROTHERAPY 25GX240 (NEEDLE) IMPLANT
NEEDLE SCLEROTHERAPY 25GX240 (NEEDLE) IMPLANT
PROBE APC STR FIRE (PROBE) IMPLANT
PROBE INJECTION GOLD (MISCELLANEOUS)
PROBE INJECTION GOLD 7FR (MISCELLANEOUS) IMPLANT
SNARE SHORT THROW 13M SML OVAL (MISCELLANEOUS) IMPLANT
SYR 50ML LL SCALE MARK (SYRINGE) IMPLANT
TUBING ENDO SMARTCAP PENTAX (MISCELLANEOUS) ×4 IMPLANT
TUBING IRRIGATION ENDOGATOR (MISCELLANEOUS) ×2 IMPLANT
WATER STERILE IRR 1000ML POUR (IV SOLUTION) IMPLANT

## 2022-06-01 NOTE — Evaluation (Signed)
Occupational Therapy Evaluation Patient Details Name: Barry [name] MRN: [id number] DOB: 1955-08-07 Today's Date: 06/01/2022   History of Present Illness  (Pt is a 67 yr old male admitted to the hospital from SNF rehab due to altered mental status & abnormal lab values. He was foudn to have AKI, hyponatremia, & anemia. PMH: liver cirrhosis, CVA, CKD 3, CAD, MI, CABG, neuropathy, anxiety disorder)   Clinical Impression   The pt was noted to be with decreased independence with self-care tasks, slight deconditioning, unsteadiness in dynamic standing, subjective reports of BLE edema, and reports of chronic soreness of his back, R knee, and R hip. He required constant steadying assist in standing and intermittent education on safety during progressive activity. He will benefit from further OT services to maximize his safety and independence with ADLs and to decrease the risk for restricted participation in meaningful activities.      Recommendations for follow up therapy are one component of a multi-disciplinary discharge planning process, led by the attending physician.  Recommendations may be updated based on patient status, additional functional criteria and insurance authorization.   Assistance Recommended at Discharge Intermittent Supervision/Assistance  Patient can return home with the following A little help with bathing/dressing/bathroom;A little help with walking and/or transfers;Direct supervision/assist for medications management    Functional Status Assessment  Patient has had a recent decline in their functional status and demonstrates the ability to make significant improvements in function in a reasonable and predictable amount of time.  Equipment Recommendations  None recommended by OT       Precautions / Restrictions Precautions Precautions: Fall Restrictions Weight Bearing Restrictions: No      Mobility Bed Mobility Overal bed mobility: Needs Assistance Bed Mobility: Supine  to Sit, Sit to Supine     Supine to sit: Min assist Sit to supine: Min assist        Transfers Overall transfer level: Needs assistance Equipment used: Rolling walker (2 wheels) Transfers: Sit to/from Stand Sit to Stand: Min assist, From elevated surface           General transfer comment:  (Cues for safety, hand placement)      Balance       Sitting balance - Comments: static sitting-good. dynamic sitting-fair+       Standing balance comment: static standing-min guard, dynamic standing-min assist with RW                           ADL either performed or assessed with clinical judgement   ADL Overall ADL's : Needs assistance/impaired Eating/Feeding: Set up Eating/Feeding Details (indicate cue type and reason): based on clinical judgement     Upper Body Bathing: Set up;Sitting Upper Body Bathing Details (indicate cue type and reason): simulated seated EOB     Upper Body Dressing : Supervision/safety;Set up;Sitting Upper Body Dressing Details (indicate cue type and reason): simulated seated EOB Lower Body Dressing: Moderate assistance   Toilet Transfer: Minimal assistance;Ambulation;Rolling walker (2 wheels)                   Vision   Additional Comments: He correctly read the time depicted on the wall clock.     Perception     Praxis      Pertinent Vitals/Pain Pain Assessment Pain Location: He denied having pain, however reported having a "sore" back and chronic R knee and hip discomfort. Pain Intervention(s): Limited activity within patient's tolerance     Hand Dominance  Right   Extremity/Trunk Assessment Upper Extremity Assessment Upper Extremity Assessment: Overall WFL for tasks assessed   Lower Extremity Assessment Lower Extremity Assessment: Overall WFL for tasks assessed       Communication Communication Communication: No difficulties   Cognition Arousal/Alertness: Awake/alert Behavior During Therapy: WFL for tasks  assessed/performed        General Comments: Able to follow simple commands, cooperative, friendly. Oriented to person, place, and year; disoriented to month.     General Comments               Home Living Family/patient expects to be discharged to:: Skilled nursing facility                Additional Comments:  (Pt has been at a SNF for rehab recently. Prior to that, he resided with a friend in a 3rd floor apartment.)      Prior Functioning/Environment        Mobility Comments:  (He has been using a RW for ambulation at the SNF.) ADLs Comments:  (He was independent with ADLs.)        OT Problem List: Decreased strength;Decreased activity tolerance;Impaired balance (sitting and/or standing);Decreased safety awareness;Decreased knowledge of use of DME or AE      OT Treatment/Interventions: Self-care/ADL training;Therapeutic exercise;Therapeutic activities;Energy conservation;Patient/family education;DME and/or AE instruction;Balance training    OT Goals(Current goals can be found in the care plan section) Acute Rehab OT Goals Patient Stated Goal: to be able to live independently again OT Goal Formulation: With patient Time For Goal Achievement: 06/15/22 Potential to Achieve Goals: Good ADL Goals Pt Will Perform Grooming: with supervision;standing Pt Will Perform Lower Body Dressing: with supervision;sit to/from stand Pt Will Transfer to Toilet: with supervision;ambulating Pt Will Perform Toileting - Clothing Manipulation and hygiene: with supervision;sit to/from stand  OT Frequency: Min 1X/week       AM-PAC OT "6 Clicks" Daily Activity     Outcome Measure Help from another person eating meals?: None Help from another person taking care of personal grooming?: A Little Help from another person toileting, which includes using toliet, bedpan, or urinal?: A Little Help from another person bathing (including washing, rinsing, drying)?: A Lot Help from another person  to put on and taking off regular upper body clothing?: A Little Help from another person to put on and taking off regular lower body clothing?: A Lot 6 Click Score: 17   End of Session Equipment Utilized During Treatment: Gait belt;Rolling walker (2 wheels) Nurse Communication: Mobility status  Activity Tolerance: Other (comment) (Fair+ tolerance; limited by R hip and knee discomfort) Patient left: in bed;with call bell/phone within reach;with chair alarm set  OT Visit Diagnosis: Unsteadiness on feet (R26.81);Muscle weakness (generalized) (M62.81)                Time: 1610-9604 OT Time Calculation (min): 22 min Charges:  OT General Charges $OT Visit: 1 Visit OT Evaluation $OT Eval Low Complexity: 1 Low   Veda Arrellano L Antionette Luster, OTR/L 06/01/2022, 4:32 PM

## 2022-06-01 NOTE — Progress Notes (Addendum)
[facility] Progress Note  CC:   Anemia, cirrhosis   Subjective: He is awake, more conversant today but remains confused. He knows he is at [facility]. He stated he previously lived with his daughter Easter Aronow in [place] phone # [phone]. I attempted to contact her at this time but she did not answer.  He passed 1 black melenic stool last night and 1 brown stool. This morning he passed a black watery stool x 1 as reported by the nursing staff.  He denies having any N/V or abdominal pain. No CP or SOB.    Objective:  Vital signs in last 24 hours: Temp:  [97.5 F (36.4 C)-98.9 F (37.2 C)] 98.5 F (36.9 C) (05/07 0443) Pulse Rate:  [70-83] 78 (05/07 0443) Resp:  [16-18] 18 (05/07 0443) BP: (92-140)/(43-92) 105/43 (05/07 0443) SpO2:  [96 %-99 %] 98 % (05/07 0443) Last BM Date : 05/31/22 General: Alert 67 year old male more conversant today in no acute distress. Heart: Regular rate and rhythm, no murmurs. Pulm: Few crackles in the right base. Abdomen: Protuberant, soft.  Mild tenderness to the epigastric area without rebound or guarding.  No ascites.  No palpable mass.  Also bowel sounds all 4 quadrants. Extremities: Mild bilateral lower extremity edema.  Large area of ecchymosis to the left upper extremity. Neurologic: Alert to name, place and year.  Speech is clear.  Moves all extremities. Psych:  Alert and cooperative. Normal mood and affect.  Intake/Output from previous day: 05/06 0701 - 05/07 0700 In: 360 [P.O.:360] Out: -  Intake/Output this shift: No intake/output data recorded.  Lab Results: Recent Labs    05/30/22 1730 05/31/22 0706 05/31/22 2105 06/01/22 0554  WBC 6.8 5.1  --  4.5  HGB 4.4* 6.4* 9.0* 8.4*  HCT 14.5* 19.8* 27.3* 25.2*  PLT 83* 57*  --  52*   BMET Recent Labs    05/30/22 1635 05/31/22 0706 06/01/22 0554  NA 133* 133* 133*  K 4.2 3.7 3.5  CL 103 104 102  CO2 22 23 24   GLUCOSE 178* 129* 156*  BUN 30* 29*  18  CREATININE 1.25* 0.96 0.96  CALCIUM 7.7* 7.3* 7.2*   LFT Recent Labs    06/01/22 0554  PROT 5.7*  ALBUMIN 2.1*  AST 103*  ALT 48*  ALKPHOS 97  BILITOT 1.6*  BILIDIR 0.4*  IBILI 1.2*   PT/INR Recent Labs    06/01/22 0554  LABPROT 17.2*  INR 1.4*   Hepatitis Panel No results for input(s): "HEPBSAG", "HCVAB", "HEPAIGM", "HEPBIGM" in the last 72 hours.  CT HEAD WO CONTRAST ( )  Result Date: 05/30/2022 CLINICAL DATA:  Anemia, altered level of consciousness EXAM: CT HEAD WITHOUT CONTRAST TECHNIQUE: Contiguous axial images were obtained from the base of the skull through the vertex without intravenous contrast. RADIATION DOSE REDUCTION: This exam was performed according to the departmental dose-optimization program which includes automated exposure control, adjustment of the mA and/or kV according to patient size and/or use of iterative reconstruction technique. COMPARISON:  05/10/2022 FINDINGS: Brain: No acute infarct or hemorrhage. Lateral ventricles and midline structures are stable. No acute extra-axial fluid collections. No mass effect. Vascular: Stable atherosclerosis.  No hyperdense vessel. Skull: Normal. Negative for fracture or focal lesion. Sinuses/Orbits: Right ocular prosthesis. Mucosal thickening within the ethmoid and frontal sinuses. Other: None. IMPRESSION: 1. No acute intracranial process. Electronically Signed   By: Sharlet Salina M.D.   On: 05/30/2022 21:20   DG Chest [facility] [address]  Result Date: 05/30/2022 CLINICAL DATA:  Shortness of breath.  Altered mental status. EXAM: PORTABLE CHEST 1 VIEW COMPARISON:  05/10/2022 FINDINGS: Previous median sternotomy and CABG. Pulmonary venous hypertension. Possible mild interstitial edema, small effusions and volume loss in the lower lungs. IMPRESSION: Suspicion of fluid overload/mild congestive heart failure. Electronically Signed   By: Paulina Fusi M.D.   On: 05/30/2022 16:48    Assessment / Plan:  67 year old male with acute  on chronic anemia. Admission Hg 4.4. On oral iron at SNF.  FOBT positive. Transfused 2 units of PRBCs -> Hg 6.4 -> transfused 1 unit PRBCs -> 9.0 -> today Hg 8.4. EGD 06/17/2021 done by GI in [place] showed Grade I varices and moderate portal hypertensive gastropathy. He passed a melenic stool last night and a watery black stool this morning. Hemodynamically stable.  -Transfuse for Hg < 7 -Monitor patient closely for active GI bleeding -Rocephin IV Q 24 hours for SBP prophylaxis  -Continue Octreotide 73mc/hr infusion  -Continue PPI IV bid  -NPO for EGD today. I attempted to contact the patient's daughter Marchello Rohal [phone] who lives in [place] to obtain consent for EGD as she is his next of kin. He does not have POA. If daughter is not contacted prior to EGD, consent will need to be signed by the hospitalist and anesthesiologist as he is actively GI bleeding which renders the EGD an emergent procedure.   Metabolic encephalopathy, query hepatic encephalopathy.  Normal ammonia level.  -Monitor neurostatus closely -Lactulose 20gm po bid, titrate to no more than 3-4 loose bowel movements daily   History of cirrhosis with esophageal varices and portal hypertensive  gastropathy. T. Bili 1.6. Alk phos 97. AST 103. ALT 48. Prior labs 04/20/2022: Hep C antibody nonreactive, Hepatitis B surface antigen reactive and Hep B core IgM equivocal. Hep B DNA quant, Hep B core total antibody, ANA, SMA, IgG, AMA and AFP pending. Repeat Hep B surface antigen and Hep B core IgM pending. RUQ sono completed this morning, results pending.  -Await the above lab results -Await RUQ sono results   Thrombocytopenia, secondary to cirrhosis and splenomegaly (abd sono 06/2018 showed hepatic steatosis and mild splenomegaly). PLT count 52.   Coagulopathy secondary to cirrhosis.  INR 1.4.  Hyponatremia. Na+ 133.    GERD -PPI bid   History of colon polyps per colonoscopy done in [place] 06/17/2021   CAD, last dose of ASA and  Plavix was at 9am on 5/5  -Continue to hold Plavix    CKD stage III   DM type II   LOS: 2 days   Arnaldo Natal  06/01/2022, 8:56AM    Attending Physician Note   I have taken an interval history, reviewed the chart and examined the patient. I performed a substantive portion of this encounter, including complete performance of at least one of the key components, in conjunction with the APP. I agree with the APP's note, impression and recommendations with my edits. My additional impressions and recommendations are as follows.   Bleeding portal gastropathy and non-bleeding esophageal varices at EGD today. Acute on chronic anemia and dark, heme + stools secondary to bleeding portal gastropathy. Continue IV PPI bid, IV octreotide, IV ceftriaxone. Transfuse to maintain Hb > 7. IR consulted for TIPS this admission for bleeding control. Transfer to [facility] is needed for TIPS.  Cirrhosis with esophageal varices, portal gastropathy and trace ascites on RUQ [place] today. Etiology unclear. HBsAg positive and HB core IgM equivocal on 04/20/2022. Repeat HB  core IgM, HBsAg, HBsAb and check HBV DNA. Check ANA, AMA, IgG, AFP   Metabolic encephalopathy, possible HE. Lactulose bid, titrate for 2-3 loose BMs/day. Primary service to evaluate for other causes.    Thrombocytopenia, likely secondary to cirrhosis   History of colon polyps on May 2023 colonoscopy performed in [place].    GERD   CAD. Continue to hold Plavix and ASA  CKD3  DM  Claudette Head, MD [facility] See Loretha Stapler, [facility], for our on call provider

## 2022-06-01 NOTE — Plan of Care (Signed)

## 2022-06-01 NOTE — Transfer of Care (Signed)
Immediate Anesthesia Transfer of Care Note  Patient: Barry [name]  Procedure(s) Performed: ESOPHAGOGASTRODUODENOSCOPY (EGD) WITH PROPOFOL  Patient Location: Short Stay  Anesthesia Type:MAC  Level of Consciousness: awake, drowsy, and patient cooperative  Airway & Oxygen Therapy: Patient Spontanous Breathing  Post-op Assessment: Report given to RN, Post -op Vital signs reviewed and stable, and Patient moving all extremities X 4  Post vital signs: Reviewed and stable  Last Vitals:  Vitals Value Taken Time  BP    Temp    Pulse 85 06/01/22 1201  Resp 20 06/01/22 1201  SpO2 96 % 06/01/22 1201  Vitals shown include unvalidated device data.  Last Pain:  Vitals:   06/01/22 1105  TempSrc: Temporal  PainSc: 0-No pain      Patients Stated Pain Goal: 0 (06/01/22 1610)  Complications: No notable events documented.

## 2022-06-01 NOTE — Consult Note (Signed)
Chief Complaint: Patient was seen in consultation today for possible TIPS Chief Complaint  Patient presents with   Abnormal Lab   Altered Mental Status    Decreased    Referring Physician(s): Stark,M  Supervising Physician: Simonne Come  Patient Status: [facility] - In-pt  History of Present Illness: Barry [name] is a 67 y.o. male with past medical history significant for iron deficiency anemia secondary to chronic blood loss, anxiety/depression, arthritis, coronary artery disease with prior MI/CABG previously on aspirin and Plavix, cerebrovascular disease with prior CVA, GERD, hyperlipidemia, hypertension, obesity, sarcoidosis of skin, sleep apnea,  diabetes, neuropathy, cirrhosis with esophageal varices, portal hypertension, portal gastropathy who was brought in to [name], ED from skilled nursing facility on 05/30/22 with altered mental status, hemoglobin of 4 and positive fecal occult blood test/dark stools.  Patient was transfused and started on octreotide and Protonix.  EGD done today revealed grade 2 esophageal varices with portal hypertensive gastropathy with active oozing in the antrum, normal duodenal bulb and second portion of the duodenum.  Right upper quadrant ultrasound today revealed hepatic cirrhosis with perihepatic ascites and nonspecific gallbladder wall thickening.  No gallstones or Murphy sign.  Echocardiogram performed on 04/09/2022 revealed left ventricular ejection fraction greater than 75%  with hyperdynamic function of left ventricle and no regional wall abnormalities.  There was mild asymmetric left ventricular hypertrophy, right ventricular systolic function was normal ,right ventricular size was normal, normal pulmonary artery systolic pressure.  Right ventricular systolic pressure was 33.5 mmHg.  Normal inferior vena cava with greater than 50% of respiratory variability.  No mitral valve or aortic valve regurgitation.  Current labs include WBC 4.5, hemoglobin 8.4,  platelets 52k, creatinine 0.96, PT 17.2, INR 1.4, total bilirubin 1.6, sodium 133. Na MELD 16.  Last ammonia 2 days ago was 26.  Last BM was this am and watery/black per nursing. Patient is currently afebrile with stable vital signs.  Request now received from GI team for consideration of TIPS.  Past Medical History:  Diagnosis Date   Anemia    Anxiety    Arthritis    Cerebrovascular disease    Cervical disc disorder    Chronic kidney disease    Cirrhosis of liver (HCC)    Coronary artery disease    Depression    Dyspnea    with exertion   Esophageal varices (HCC)    hx of   Family history of colon cancer 10/15/2019   GERD (gastroesophageal reflux disease)    Headache    Hyperlipidemia    Hyperlipidemia    Hypocalcemia    MI (myocardial infarction) (HCC) 04/23/2007   inferior wall   Morbid obesity (HCC) 06/26/2012   Neuropathy    secondary to diabetes   Pancreatitis    hs of   S/P CABG x 3 07/04/2012   LIMA to LAD, SVG to D1, SVG to PDA, EVH via right thigh   Sarcoidosis of skin    Sleep apnea    no cpap   Stroke (HCC)    small stroke - 2023 - legs weak , righ tleg weak   Type II or unspecified type diabetes mellitus without mention of complication, not stated as uncontrolled    Unspecified essential hypertension     Past Surgical History:  Procedure Laterality Date   CORONARY ANGIOPLASTY WITH STENT PLACEMENT  04/23/2007   PCI and stenting of mid RCA - Dr Bary Castilla @ [facility]   CORONARY ARTERY BYPASS GRAFT N/A 07/04/2012   Procedure: CORONARY  ARTERY BYPASS GRAFTING (CABG);  Surgeon: [name], MD;  Location: [facility] OR;  Service: Open Heart Surgery;  Laterality: N/A;  x3 using right greater saphenous vein and left internal mammary.    INTRAOPERATIVE TRANSESOPHAGEAL ECHOCARDIOGRAM N/A 07/04/2012   Procedure: INTRAOPERATIVE TRANSESOPHAGEAL ECHOCARDIOGRAM;  Surgeon: [name], MD;  Location: [facility] OR;  Service: Open Heart Surgery;  Laterality: N/A;   LEFT HEART CATH AND  CORS/GRAFTS ANGIOGRAPHY N/A 04/18/2017   Procedure: LEFT HEART CATH AND CORS/GRAFTS ANGIOGRAPHY;  Surgeon: [name], MD;  Location: [facility] INVASIVE CV LAB;  Service: Cardiovascular;  Laterality: N/A;   LEFT HEART CATHETERIZATION WITH CORONARY ANGIOGRAM N/A 06/25/2012   Procedure: LEFT HEART CATHETERIZATION WITH CORONARY ANGIOGRAM;  Surgeon: [name], MD;  Location: [facility] CATH LAB;  Service: Cardiovascular;  Laterality: N/A;   TOOTH EXTRACTION  04/2018   4 teeth pulled     Allergies: Patient has no known allergies.  Medications: Prior to Admission medications   Medication Sig Start Date End Date Taking? Authorizing Provider  acetaminophen (TYLENOL) 500 MG tablet Take 1 tablet (500 mg total) by mouth every 8 (eight) hours as needed for moderate pain. 04/24/22  Yes [name], MD  albuterol (PROVENTIL HFA;VENTOLIN HFA) [phone] Base) MCG/ACT inhaler Inhale 2 puffs into the lungs every 6 (six) hours as needed for wheezing or shortness of breath. 04/21/17  Yes Kirt Boys, DO  aspirin 81 MG chewable tablet Chew 81 mg by mouth daily.   Yes [provider]  atorvastatin (LIPITOR) 80 MG tablet Take 1 tablet (80 mg total) by mouth daily. Patient taking differently: Take 80 mg by mouth every evening. 05/08/22  Yes [name], MD  busPIRone (BUSPAR) 15 MG tablet TAKE 1 TABLET BY MOUTH THREE TIMES DAILY FOR ANXIETY Patient taking differently: Take 15 mg by mouth 3 (three) times daily. 07/19/17  Yes Kirt Boys, DO  cetirizine (ZYRTEC) 10 MG tablet Take 10 mg by mouth daily.   Yes [provider]  clopidogrel (PLAVIX) 75 MG tablet APPOINTMENT OVERDUE Take 1 by mouth daily Patient taking differently: Take 75 mg by mouth daily. 10/31/19  Yes Sharon Seller, NP  cyclobenzaprine (FLEXERIL) 10 MG tablet Take 10 mg by mouth at bedtime. May take an additional 10 mg up to twice daily as needed for pain related to RIGHT HIP, DO NOT GIVE WITHIN 6 HOURS OF SCHEDULED DOSE  07/23/19  Yes [provider]  DULoxetine (CYMBALTA) 20 MG capsule Take 20 mg by mouth daily.   Yes [provider]  FEROSUL 325 (65 Fe) MG tablet Take 325 mg by mouth daily. 10/13/21  Yes [provider]  fluticasone-salmeterol (ADVAIR) 100-50 MCG/ACT AEPB Inhale 1 puff into the lungs 2 (two) times daily.   Yes [provider]  furosemide (LASIX) 40 MG tablet Take 40 mg by mouth daily.   Yes [provider]  gabapentin (NEURONTIN) 300 MG capsule Take 300 mg by mouth in the morning and at bedtime. 09/12/19  Yes [provider]  lactulose (CHRONULAC) 10 GM/15ML solution Take 15 mLs (10 g total) by mouth 3 (three) times daily. 04/24/22  Yes [name], MD  metFORMIN (GLUCOPHAGE) 1000 MG tablet TAKE 1 TABLET(1000 MG) BY MOUTH TWICE DAILY Patient taking differently: Take 1,000 mg by mouth 2 (two) times daily with a meal. 09/09/17  Yes Kirt Boys, DO  metoprolol tartrate (LOPRESSOR) 25 MG tablet Take 0.5 tablets (12.5 mg total) by mouth 2 (two) times daily. 05/18/17  Yes Kirt Boys, DO  oxyCODONE-acetaminophen (PERCOCET) 10-325 MG tablet Take 1 tablet by mouth every 8 (eight) hours as needed for pain. Patient taking differently: Take 1 tablet by mouth daily. And as needed every 6 hours  for chronic pain 04/24/22  Yes [name], MD  pantoprazole (PROTONIX) 40 MG tablet TAKE 1 TABLET BY MOUTH EVERY DAY Patient taking differently: Take 40 mg by mouth daily. 04/12/22  Yes Ennever, Rose Phi, MD  potassium chloride (KLOR-CON) 10 MEQ tablet Take 10 mEq by mouth 2 (two) times daily.   Yes [provider]  sertraline (ZOLOFT) 100 MG tablet TAKE 2 TABLETS(200 MG) BY MOUTH DAILY Patient taking differently: Take 200 mg by mouth daily. 04/20/19  Yes Sharon Seller, NP  thiamine (VITAMIN B-1) 100 MG tablet Take 1 tablet (100 mg total) by mouth daily. 03/06/22  Yes [name], MD  Blood Glucose Monitoring Suppl (CONTOUR NEXT EZ  MONITOR) w/Device KIT Test blood sugar three times daily E11.22 03/08/16   Edison Pace, RPH-CPP     Family History  Problem Relation Age of Onset   Cancer Mother        unknown type; dx late 11s   Cancer Father        unknown type; dx > 50yo   Colon cancer Brother        dx late 91s   Colon cancer Brother        dx late 75s; dx 47   Cancer Sister        unknown type; dx late 62s   Diabetes Sister    Diabetes Brother    Cancer Maternal Aunt        unknown type; dx > 50yo    Social History   Socioeconomic History   Marital status: Divorced    Spouse name: Not on file   Number of children: 1   Years of education: Not on file   Highest education level: Not on file  Occupational History   Occupation: MAINTENANCE    Employer: [employer]  Tobacco Use   Smoking status: Never   Smokeless tobacco: Never  Vaping Use   Vaping Use: Never used  Substance and Sexual Activity   Alcohol use: No    Alcohol/week: 0.0 standard drinks of alcohol   Drug use: No   Sexual activity: Not Currently    Partners: Female  Other Topics Concern   Not on file  Social History Narrative   Lives alone in a one story home.  Has one daughter.  Works as a Armed forces training and education officer.  Education: high school.    Social Determinants of Health   Financial Resource Strain: Not on file  Food Insecurity: No Food Insecurity (04/24/2022)   Hunger Vital Sign    Worried About Running Out of Food in the Last Year: Never true    Ran Out of Food in the Last Year: Never true  Transportation Needs: No Transportation Needs (04/24/2022)   PRAPARE - Administrator, Civil Service (Medical): No    Lack of Transportation (Non-Medical): No  Physical Activity: Not on file  Stress: Not on file  Social Connections: Not on file      Review of Systems; pt has had recent HA's, abd/back pain, intermittent nausea, black stools  Vital Signs: BP 123/63 (BP Location: Right Arm)   Pulse 81   Temp (!) 97.5  F (36.4 C)   Resp 19   Ht 5\' 4"  (1.626 m)  Wt 250 lb (113.4 kg)   SpO2 96%   BMI 42.91 kg/m      Physical Exam; pt drowsy/lethargic but arousable, will fall asleep during conversation; chest- sl dim BS bases; heart- RRR, +murmur; abd- obese, soft,+BS, some mild diffuse tenderness to palpation; no sig pretib edema  Imaging: US Abdomen Limited RUQ (LIVER/GB)  Result Date: 06/01/2022 CLINICAL DATA:  92834 Cirrhosis (HCC) 92834 EXAM: ULTRASOUND ABDOMEN LIMITED RIGHT UPPER QUADRANT COMPARISON:  07/21/2018 FINDINGS: Gallbladder: No gallstones visualized. Mild nonspecific wall thickening measuring 0.8 mm. No Murphy's sign elicited. No pericholecystic fluid. Common bile duct: Diameter: 3.6 mm Liver: There is coarse heterogeneous echotexture with surface nodularity compatible with cirrhosis. No large focal hepatic abnormality or biliary obstruction pattern. Portal vein is patent on color Doppler imaging with normal direction of blood flow towards the liver. Other: Surrounding perihepatic ascites noted. IMPRESSION: 1. Hepatic cirrhosis 2. Perihepatic ascites. 3. Nonspecific gallbladder wall thickening. No gallstones or Murphy's sign. Electronically Signed   By: Judie Petit.  Shick M.D.   On: 06/01/2022 09:38   CT HEAD WO CONTRAST ( )  Result Date: 05/30/2022 CLINICAL DATA:  Anemia, altered level of consciousness EXAM: CT HEAD WITHOUT CONTRAST TECHNIQUE: Contiguous axial images were obtained from the base of the skull through the vertex without intravenous contrast. RADIATION DOSE REDUCTION: This exam was performed according to the departmental dose-optimization program which includes automated exposure control, adjustment of the mA and/or kV according to patient size and/or use of iterative reconstruction technique. COMPARISON:  05/10/2022 FINDINGS: Brain: No acute infarct or hemorrhage. Lateral ventricles and midline structures are stable. No acute extra-axial fluid collections. No mass effect. Vascular: Stable  atherosclerosis.  No hyperdense vessel. Skull: Normal. Negative for fracture or focal lesion. Sinuses/Orbits: Right ocular prosthesis. Mucosal thickening within the ethmoid and frontal sinuses. Other: None. IMPRESSION: 1. No acute intracranial process. Electronically Signed   By: Sharlet Salina M.D.   On: 05/30/2022 21:20   DG Chest Port 1 View  Result Date: 05/30/2022 CLINICAL DATA:  Shortness of breath.  Altered mental status. EXAM: PORTABLE CHEST 1 VIEW COMPARISON:  05/10/2022 FINDINGS: Previous median sternotomy and CABG. Pulmonary venous hypertension. Possible mild interstitial edema, small effusions and volume loss in the lower lungs. IMPRESSION: Suspicion of fluid overload/mild congestive heart failure. Electronically Signed   By: Paulina Fusi M.D.   On: 05/30/2022 16:48   CT Head Wo Contrast  Result Date: 05/10/2022 CLINICAL DATA:  Mental status change of unknown cause. EXAM: CT HEAD WITHOUT CONTRAST TECHNIQUE: Contiguous axial images were obtained from the base of the skull through the vertex without intravenous contrast. RADIATION DOSE REDUCTION: This exam was performed according to the departmental dose-optimization program which includes automated exposure control, adjustment of the mA and/or kV according to patient size and/or use of iterative reconstruction technique. COMPARISON:  04/18/2022 FINDINGS: Brain: No evidence of acute infarction, hemorrhage, hydrocephalus, extra-axial collection or mass lesion/mass effect. Vascular: No hyperdense vessel or unexpected calcification. Skull: Normal. Negative for fracture or focal lesion. Sinuses/Orbits: Stable prosthetic right globe. Left globe and orbit are unremarkable. Significant sinus disease. Moderate mucosal thickening with dependent fluid noted in the maxillary sinuses. Moderate to marked mucosal thickening noted throughout the ethmoid air cells, many of which are opacified. Small sphenoid sinuses with mucosal thickening, left with dependent  fluid. Dependent fluid in the left frontal sinus. Other: None. IMPRESSION: 1. No acute intracranial abnormalities. 2. Significant sinus disease as detailed including fluid levels. Consider acute sinusitis in the proper clinical setting. Electronically Signed   By:  Amie Portland M.D.   On: 05/10/2022 15:10   DG Chest Port 1 View  Result Date: 05/10/2022 CLINICAL DATA:  Altered mental status EXAM: PORTABLE CHEST - 1 VIEW COMPARISON:  04/18/2022 FINDINGS: Patchy interstitial opacities in the lung bases slightly improved since previous. Heart size upper limits normal. CABG markers. Aortic Atherosclerosis (ICD10-170.0). Chronic blunting of left lateral costophrenic angle. Sternotomy wires. IMPRESSION: Slight improvement in bibasilar interstitial opacities. Electronically Signed   By: Corlis Leak M.D.   On: 05/10/2022 14:37    Labs:  CBC: Recent Labs    05/10/22 1526 05/30/22 1730 05/31/22 0706 05/31/22 2105 06/01/22 0554  WBC 5.8 6.8 5.1  --  4.5  HGB 9.3* 4.4* 6.4* 9.0* 8.4*  HCT 28.7* 14.5* 19.8* 27.3* 25.2*  PLT 56* 83* 57*  --  52*    COAGS: Recent Labs    03/01/22 1400 04/18/22 1820 06/01/22 0554  INR 1.1* 1.3* 1.4*    BMP: Recent Labs    05/10/22 1526 05/30/22 1635 05/31/22 0706 06/01/22 0554  NA 136 133* 133* 133*  K 4.6 4.2 3.7 3.5  CL 104 103 104 102  CO2 19* 22 23 24   GLUCOSE 72 178* 129* 156*  BUN 24* 30* 29* 18  CALCIUM 8.0* 7.7* 7.3* 7.2*  CREATININE 0.98 1.25* 0.96 0.96  GFRNONAA >60 >60 >60 >60    LIVER FUNCTION TESTS: Recent Labs    05/10/22 1526 05/30/22 1635 05/31/22 0706 06/01/22 0554  BILITOT 2.2* 1.3* 1.7* 1.6*  AST 76* 117* 105* 103*  ALT 50* 50* 48* 48*  ALKPHOS 113 97 92 97  PROT 6.9 5.9* 5.4* 5.7*  ALBUMIN 2.8* 2.1* 2.0* 2.1*    TUMOR MARKERS: No results for input(s): "AFPTM", "CEA", "CA199", "CHROMGRNA" in the last 8760 hours.  Assessment and Plan: 67 y.o. male with past medical history significant for iron deficiency anemia  secondary to chronic blood loss, anxiety/depression, arthritis, coronary artery disease with prior MI/CABG previously on aspirin and Plavix, cerebrovascular disease with prior CVA, GERD, hyperlipidemia, hypertension, obesity, sarcoidosis of skin, sleep apnea,  diabetes, neuropathy, cirrhosis with esophageal varices, portal hypertension, portal gastropathy who was brought in to [name], ED from skilled nursing facility on 05/30/22 with altered mental status, hemoglobin of 4 and positive fecal occult blood test/dark stools.  Patient was transfused and started on octreotide and Protonix.  EGD done today revealed grade 2 esophageal varices with portal hypertensive gastropathy with active oozing in the antrum, normal duodenal bulb and second portion of the duodenum.  Right upper quadrant ultrasound today revealed hepatic cirrhosis with perihepatic ascites and nonspecific gallbladder wall thickening.  No gallstones or Murphy sign.  Echocardiogram performed on 04/09/2022 revealed left ventricular ejection fraction greater than 75%  with hyperdynamic function of left ventricle and no regional wall abnormalities.  There was mild asymmetric left ventricular hypertrophy, right ventricular systolic function was normal ,right ventricular size was normal, normal pulmonary artery systolic pressure.  Right ventricular systolic pressure was 33.5 mmHg.  Normal inferior vena cava with greater than 50% of respiratory variability.  No mitral valve or aortic valve regurgitation.  Current labs include WBC 4.5, hemoglobin 8.4, platelets 52k, creatinine 0.96, PT 17.2, INR 1.4, total bilirubin 1.6, sodium 133. Na MELD 16.  Last ammonia 2 days ago was 26.  Last BM was this am and watery/black per nursing. Patient is currently afebrile with stable vital signs.  Request now received from GI team for consideration of TIPS.  Case has been reviewed by Dr. Grace Isaac and  discussed with Dr. Russella Dar.  Recommend CT angio of abdomen pelvis BRTO protocol to  assess anatomy for potential TIPS.  Closely monitor labs/ patient's mental status as TIPS could worsen existing confusion.  If TIPS performed patient will need transfer to [facility] for procedure as this is not done at [facility] -recommend transfer sooner rather than later to avoid possible delays in care in case emergent procedure needed.  Will continue to monitor.   Thank you for this interesting consult.  I greatly enjoyed meeting Barry [name] and look forward to participating in their care.  A copy of this report was sent to the requesting provider on this date.  Electronically Signed: D. Jeananne Rama, PA-C 06/01/2022, 2:48 PM   I spent a total of  25 minutes   in face to face in clinical consultation, greater than 50% of which was counseling/coordinating care for possible TIPS

## 2022-06-01 NOTE — Progress Notes (Signed)
[facility] PROGRESS NOTE    Progress Note  [name]  WUJ:[id number] DOB: 29-Oct-1955 DOA: 05/30/2022 PCP: [name], MD     Brief Narrative:   Barry [name] is an 67 y.o. male past medical history significant for liver cirrhosis, CVA chronic kidney disease stage III A, CAD with a history of CABG in 2014 brought in from skilled nursing facility for altered mental status and hemoglobin of 4 (last hemoglobin on 05/10/2022 was 9.3), FOBT was positive.  Assessment/Plan:   Symptomatic anemia/acute blood loss anemia history of cirrhosis with esophageal varices with portal hypertension and portal gastropathy: Status post 4 units packed red blood cells, hemoglobin 8.4. To keep hemoglobin above 8 has a history of coronary artery disease and CABG GI was consulted as there was a concern for esophageal variceal bleed. Had a watery black stool this morning Started on IV octreotide, continue IV Protonix twice a day. N.p.o. for possible endoscopy on 06/01/2022. He is still confused not able to provide consent he is at right high risk of decompensating and emergent endoscopy is needed for acute GI bleed in a patient with a history of esophageal varices and portal hypertensive gastropathy. Continue IV Rocephin for SBP prophylaxis.  Acute kidney injury: Possibly prerenal azotemia, with a previous hemoglobin of less than 1 on admission 1.2, currently getting 2 units packed red blood cells which will help with volume expansion.  KVO IV fluids creatinine has returned to baseline.  Liver cirrhosis: No ascites, ammonia level 26. Continue lactulose p.o. twice daily.  7 for SBP prophylaxis MELD score 3.0  is 17.  Acute metabolic encephalopathy: CT head showed no acute findings. Lactulose p.o. twice daily  Hypovolemic hyponatremia: Stable 133. Continue to monitor.  Elevated troponins: He denies any chest pain, he did relate some shortness of breath but his troponins are trending down there is  likely due to drop in hemoglobin likely demand ischemia. Try to keep hemoglobin 8 greater than 8 he has a history of CABG 2014  Thrombocytopenia: Secondary to liver cirrhosis they are trending up.  Essential hypertension: Continue to hold antihypertensive medication.  GERD: Continue PPI.  Obstructive sleep apnea: Continue CPAP at night.  Hyperlipidemia:  continue statins.  DVT prophylaxis: scd Family Communication:none Status is: Inpatient Remains inpatient appropriate because: Acute GI bleed    Code Status:     Code Status Orders  (From admission, onward)           Start     Ordered   05/30/22 1858  Full code  Continuous       Question:  By:  Answer:  Consent: discussion documented in EHR   05/30/22 1858           Code Status History     Date Active Date Inactive Code Status Order ID Comments User Context   04/24/2022 1617 04/28/2022 1539 Full Code [id number]  [name], MD ED   04/19/2022 0004 04/24/2022 1454 Full Code [id number]  [name], DO ED   04/18/2017 1002 04/18/2017 1747 Full Code [id number]  [name], MD Inpatient   07/05/2012 0825 07/10/2012 1936 Full Code [id number]  [name], MD Inpatient   07/04/2012 1417 07/05/2012 0825 Full Code [id number]  [name], MD Inpatient         IV Access:   Peripheral IV   Procedures and diagnostic studies:   CT HEAD WO CONTRAST ( )  Result Date: 05/30/2022 CLINICAL DATA:  Anemia, altered level of consciousness  EXAM: CT HEAD WITHOUT CONTRAST TECHNIQUE: Contiguous axial images were obtained from the base of the skull through the vertex without intravenous contrast. RADIATION DOSE REDUCTION: This exam was performed according to the departmental dose-optimization program which includes automated exposure control, adjustment of the mA and/or kV according to patient size and/or use of iterative reconstruction technique. COMPARISON:  05/10/2022 FINDINGS: Brain: No acute infarct or  hemorrhage. Lateral ventricles and midline structures are stable. No acute extra-axial fluid collections. No mass effect. Vascular: Stable atherosclerosis.  No hyperdense vessel. Skull: Normal. Negative for fracture or focal lesion. Sinuses/Orbits: Right ocular prosthesis. Mucosal thickening within the ethmoid and frontal sinuses. Other: None. IMPRESSION: 1. No acute intracranial process. Electronically Signed   By: Sharlet Salina M.D.   On: 05/30/2022 21:20   DG Chest Port 1 View  Result Date: 05/30/2022 CLINICAL DATA:  Shortness of breath.  Altered mental status. EXAM: PORTABLE CHEST 1 VIEW COMPARISON:  05/10/2022 FINDINGS: Previous median sternotomy and CABG. Pulmonary venous hypertension. Possible mild interstitial edema, small effusions and volume loss in the lower lungs. IMPRESSION: Suspicion of fluid overload/mild congestive heart failure. Electronically Signed   By: Paulina Fusi M.D.   On: 05/30/2022 16:48     Medical Consultants:   None.   Subjective:    Ricko Macdonell [name] still confused this morning.  Objective:    Vitals:   05/31/22 1558 05/31/22 1925 06/01/22 0125 06/01/22 0443  BP: 124/76 92/71 (!) 140/58 (!) 105/43  Pulse: 75 80 70 78  Resp: 16 18  18   Temp: 98.9 F (37.2 C) 98.2 F (36.8 C)  98.5 F (36.9 C)  TempSrc: Axillary     SpO2: 99% 96%  98%  Weight:      Height:       SpO2: 98 %   Intake/Output Summary (Last 24 hours) at 06/01/2022 0910 Last data filed at 06/01/2022 5621 Gross per 24 hour  Intake 120 ml  Output 500 ml  Net -380 ml    Filed Weights   05/30/22 1515  Weight: 113.4 kg    Exam: General exam: In no acute distress. Respiratory system: Good air movement and clear to auscultation. Cardiovascular system: S1 & S2 heard, RRR. No JVD. Gastrointestinal system: Abdomen is nondistended, soft and nontender.  Extremities: No pedal edema. Skin: No rashes, lesions or ulcers Psychiatry: Judgement and insight appear normal. Mood & affect  appropriate. Data Reviewed:    Labs: Basic Metabolic Panel: Recent Labs  Lab 05/30/22 1635 05/31/22 0706 06/01/22 0554  NA 133* 133* 133*  K 4.2 3.7 3.5  CL 103 104 102  CO2 22 23 24   GLUCOSE 178* 129* 156*  BUN 30* 29* 18  CREATININE 1.25* 0.96 0.96  CALCIUM 7.7* 7.3* 7.2*    GFR Estimated Creatinine Clearance: 87.8 mL/min (by C-G formula based on SCr of 0.96 mg/dL). Liver Function Tests: Recent Labs  Lab 05/30/22 1635 05/31/22 0706 06/01/22 0554  AST 117* 105* 103*  ALT 50* 48* 48*  ALKPHOS 97 92 97  BILITOT 1.3* 1.7* 1.6*  PROT 5.9* 5.4* 5.7*  ALBUMIN 2.1* 2.0* 2.1*    No results for input(s): "LIPASE", "AMYLASE" in the last 168 hours. Recent Labs  Lab 05/30/22 1631  AMMONIA 26    Coagulation profile Recent Labs  Lab 06/01/22 0554  INR 1.4*   COVID-19 Labs  No results for input(s): "DDIMER", "FERRITIN", "LDH", "CRP" in the last 72 hours.  Lab Results  Component Value Date   SARSCOV2NAA NEGATIVE 05/10/2022  CBC: Recent Labs  Lab 05/30/22 1730 05/31/22 0706 05/31/22 2105 06/01/22 0554  WBC 6.8 5.1  --  4.5  NEUTROABS 3.7  --   --   --   HGB 4.4* 6.4* 9.0* 8.4*  HCT 14.5* 19.8* 27.3* 25.2*  MCV 109.0* 103.7*  --  96.9  PLT 83* 57*  --  52*    Cardiac Enzymes: No results for input(s): "CKTOTAL", "CKMB", "CKMBINDEX", "TROPONINI" in the last 168 hours. BNP (last 3 results) No results for input(s): "PROBNP" in the last 8760 hours. CBG: Recent Labs  Lab 05/31/22 0817 05/31/22 1133 05/31/22 1701 05/31/22 2043 06/01/22 0714  GLUCAP 93 156* 117* 118* 146*    D-Dimer: No results for input(s): "DDIMER" in the last 72 hours. Hgb A1c: No results for input(s): "HGBA1C" in the last 72 hours. Lipid Profile: No results for input(s): "CHOL", "HDL", "LDLCALC", "TRIG", "CHOLHDL", "LDLDIRECT" in the last 72 hours. Thyroid function studies: No results for input(s): "TSH", "T4TOTAL", "T3FREE", "THYROIDAB" in the last 72 hours.  Invalid  input(s): "FREET3" Anemia work up: No results for input(s): "VITAMINB12", "FOLATE", "FERRITIN", "TIBC", "IRON", "RETICCTPCT" in the last 72 hours. Sepsis Labs: Recent Labs  Lab 05/30/22 1730 05/31/22 0706 06/01/22 0554  WBC 6.8 5.1 4.5    Microbiology No results found for this or any previous visit (from the past 240 hour(s)).   Medications:    insulin aspart  0-5 Units Subcutaneous QHS   insulin aspart  0-9 Units Subcutaneous TID WC   lactulose  20 g Oral BID   pantoprazole (PROTONIX) IV  40 mg Intravenous Q12H   Continuous Infusions:  cefTRIAXone (ROCEPHIN)  IV 2 g (05/31/22 1536)   octreotide (SANDOSTATIN) 500 mcg in sodium chloride 0.9 % 250 mL (2 mcg/mL) infusion 50 mcg/hr (06/01/22 0116)      LOS: 2 days   Marinda Elk  [facility]  06/01/2022, 9:10 AM

## 2022-06-01 NOTE — Evaluation (Signed)
Physical Therapy Evaluation Patient Details Name: Barry [name] MRN: [id number] DOB: 1956-01-13 Today's Date: 06/01/2022  History of Present Illness  66 yo male admitted with anemia. Pt had a fall of stretcher in ED. Hx of liver cirrhosis, CVA, CKD, anxiety, morbid obesity, CAD, MI, neuropathy, DM, OSA, R eye prosthesis, CABG.  Clinical Impression  On eval, pt required Min-Mod A for mobility. He was able to stand and pivot to bsc with RW. Session was limited due to transporter arriving to take pt for procedure. Will plan to follow pt and progress activity as tolerated. Recommend return to facility to complete rehab.        Recommendations for follow up therapy are one component of a multi-disciplinary discharge planning process, led by the attending physician.  Recommendations may be updated based on patient status, additional functional criteria and insurance authorization.  Follow Up Recommendations Can patient physically be transported by private vehicle: No     Assistance Recommended at Discharge Frequent or constant Supervision/Assistance  Patient can return home with the following  A little help with walking and/or transfers;A little help with bathing/dressing/bathroom;Assistance with cooking/housework;Assist for transportation;Help with stairs or ramp for entrance    Equipment Recommendations None recommended by PT  Recommendations for Other Services       Functional Status Assessment Patient has had a recent decline in their functional status and demonstrates the ability to make significant improvements in function in a reasonable and predictable amount of time.     Precautions / Restrictions Precautions Precautions: Fall Restrictions Weight Bearing Restrictions: No      Mobility  Bed Mobility Overal bed mobility: Needs Assistance Bed Mobility: Supine to Sit     Supine to sit: Mod assist     General bed mobility comments: Assist for trunk and R LE. Increased time.     Transfers Overall transfer level: Needs assistance Equipment used: Rolling walker (2 wheels) Transfers: Sit to/from Stand, Bed to chair/wheelchair/BSC Sit to Stand: Min assist, From elevated surface   Step pivot transfers: Min assist       General transfer comment: Assist to power up, stabilize, control descent. Cues for safety, hand placement    Ambulation/Gait               General Gait Details: NT on today-transporter arrived to take pt down for procedure  Stairs            Wheelchair Mobility    Modified Rankin (Stroke Patients Only)       Balance Overall balance assessment: Needs assistance         Standing balance support: During functional activity, Reliant on assistive device for balance                                 Pertinent Vitals/Pain Pain Assessment Pain Assessment: Faces Faces Pain Scale: Hurts little more Pain Location: R hip Pain Descriptors / Indicators: Grimacing Pain Intervention(s): Limited activity within patient's tolerance, Monitored during session, Repositioned    Home Living Family/patient expects to be discharged to:: Skilled nursing facility                        Prior Function Prior Level of Function : Needs assist             Mobility Comments: working with therapy per pt       Hand Dominance  Extremity/Trunk Assessment   Upper Extremity Assessment Upper Extremity Assessment: Defer to OT evaluation    Lower Extremity Assessment Lower Extremity Assessment: Generalized weakness    Cervical / Trunk Assessment Cervical / Trunk Assessment: Normal  Communication   Communication: No difficulties  Cognition Arousal/Alertness: Awake/alert Behavior During Therapy: WFL for tasks assessed/performed Overall Cognitive Status: History of cognitive impairments - at baseline                                          General Comments      Exercises      Assessment/Plan    PT Assessment Patient needs continued PT services  PT Problem List Decreased strength;Decreased range of motion;Decreased activity tolerance;Decreased balance;Decreased mobility;Decreased knowledge of use of DME;Decreased knowledge of precautions       PT Treatment Interventions DME instruction;Gait training;Therapeutic exercise;Balance training;Functional mobility training;Therapeutic activities;Patient/family education    PT Goals (Current goals can be found in the Care Plan section)  Acute Rehab PT Goals Patient Stated Goal: to get hip fixed PT Goal Formulation: With patient Time For Goal Achievement: 06/15/22 Potential to Achieve Goals: Good    Frequency Min 1X/week     Co-evaluation               AM-PAC PT "6 Clicks" Mobility  Outcome Measure Help needed turning from your back to your side while in a flat bed without using bedrails?: A Little Help needed moving from lying on your back to sitting on the side of a flat bed without using bedrails?: A Little Help needed moving to and from a bed to a chair (including a wheelchair)?: A Little Help needed standing up from a chair using your arms (e.g., wheelchair or bedside chair)?: A Little Help needed to walk in hospital room?: A Little Help needed climbing 3-5 steps with a railing? : Total 6 Click Score: 16    End of Session   Activity Tolerance: Patient tolerated treatment well Patient left: with nursing/sitter in room (on BSC with NT in room)   PT Visit Diagnosis: History of falling (Z91.81);Muscle weakness (generalized) (M62.81);Difficulty in walking, not elsewhere classified (R26.2)    Time: 1610-9604 PT Time Calculation (min) (ACUTE ONLY): 16 min   Charges:   PT Evaluation $PT Eval Low Complexity: 1 Low             Faye Ramsay, PT Acute Rehabilitation  Office: [phone]

## 2022-06-01 NOTE — Op Note (Signed)
[facility] Patient Name: Barry [name] Procedure Date: 06/01/2022 MRN: [id number] Attending MD: Meryl Dare , MD, [phone] Date of Birth: [birth year] CSN: [id number] Age: 67 Admit Type: Inpatient Procedure:                Upper GI endoscopy Indications:              Iron deficiency anemia secondary to chronic blood                            loss, Heme positive stool Providers:                Venita Lick. Russella Dar, MD, Lorenza Evangelist, RN, Leanne Lovely, Technician Referring MD:             [facility] Medicines:                Monitored Anesthesia Care Complications:            No immediate complications. Estimated Blood Loss:     Estimated blood loss: none. Procedure:                Pre-Anesthesia Assessment:                           - Prior to the procedure, a History and Physical                            was performed, and patient medications and                            allergies were reviewed. The patient's tolerance of                            previous anesthesia was also reviewed. The risks                            and benefits of the procedure and the sedation                            options and risks were discussed with the patient.                            All questions were answered, and informed consent                            was obtained. Prior Anticoagulants: The patient has                            taken Plavix (clopidogrel), last dose was 2 days                            prior to procedure. ASA Grade Assessment: III - A  patient with severe systemic disease. After                            reviewing the risks and benefits, the patient was                            deemed in satisfactory condition to undergo the                            procedure.                           After obtaining informed consent, the endoscope was                            passed under direct vision.  Throughout the                            procedure, the patient's blood pressure, pulse, and                            oxygen saturations were monitored continuously. The                            [id number]-[id number] ([id number] Olympus endoscope was introduced                            through the mouth, and advanced to the second part                            of duodenum. The upper GI endoscopy was                            accomplished without difficulty. The patient                            tolerated the procedure well. Scope In: Scope Out: Findings:      Grade II varices were found in the distal esophagus. No sigmata of       recent hemorrhage. They were 5 mm in largest diameter.      The exam of the esophagus was otherwise normal.      Moderate portal hypertensive gastropathy was found in the entire       examined stomach with active diffuse oozing in the antrum.      The exam of the stomach was otherwise normal.      The duodenal bulb and second portion of the duodenum were normal. Impression:               - Grade II esophageal varices, nonbleeding.                           - Portal hypertensive gastropathy with active                            oozing in the antrum.                           -  Normal duodenal bulb and second portion of the                            duodenum.                           - No specimens collected. Moderate Sedation:      Not Applicable - Patient had care per Anesthesia. Recommendation:           - Return patient to hospital ward for ongoing care.                           - Clear liquid diet today.                           - Continue present medications.                           - Continue to hold Plavix and ASA.                           - IR consult for consideration of TIPS Procedure Code(s):        --- Professional ---                           [phone], Esophagogastroduodenoscopy, flexible,                            transoral; diagnostic,  including collection of                            specimen(s) by brushing or washing, when performed                            (separate procedure) Diagnosis Code(s):        --- Professional ---                           I85.00, Esophageal varices without bleeding                           K76.6, Portal hypertension                           K31.89, Other diseases of stomach and duodenum                           D50.0, Iron deficiency anemia secondary to blood                            loss (chronic)                           R19.5, Other fecal abnormalities CPT copyright 2022 [employer]. All rights reserved. The codes documented in this report are preliminary and upon coder review may  be revised to meet current compliance requirements. Meryl Dare, MD 06/01/2022 12:00:02 PM This  report has been signed electronically. Number of Addenda: 0

## 2022-06-01 NOTE — Interval H&P Note (Signed)
History and Physical Interval Note:  06/01/2022 11:28 AM  Barry [name]  has presented today for surgery, with the diagnosis of Anemia, cirrhosis with history of esophageal varices, heme positive stool.  The various methods of treatment have been discussed with the patient and family. After consideration of risks, benefits and other options for treatment, the patient has consented to  Procedure(s): ESOPHAGOGASTRODUODENOSCOPY (EGD) WITH PROPOFOL (N/A) as a surgical intervention.  The patient's history has been reviewed, patient examined, no change in status, stable for surgery.  I have reviewed the patient's chart and labs.  Questions were answered to the patient's satisfaction.     Venita Lick. Russella Dar

## 2022-06-01 NOTE — Anesthesia Postprocedure Evaluation (Signed)
Anesthesia Post Note  Patient: Barry [name]  Procedure(s) Performed: ESOPHAGOGASTRODUODENOSCOPY (EGD) WITH PROPOFOL     Patient location during evaluation: Endoscopy Anesthesia Type: MAC Level of consciousness: awake and alert Pain management: pain level controlled Vital Signs Assessment: post-procedure vital signs reviewed and stable Respiratory status: spontaneous breathing, nonlabored ventilation, respiratory function stable and patient connected to nasal cannula oxygen Cardiovascular status: blood pressure returned to baseline and stable Postop Assessment: no apparent nausea or vomiting Anesthetic complications: no  No notable events documented.  Last Vitals:  Vitals:   06/01/22 1222 06/01/22 1238  BP: 120/65 123/63  Pulse: 82 81  Resp: 20 19  Temp:  (!) 36.4 C  SpO2: 98% 96%    Last Pain:  Vitals:   06/01/22 1222  TempSrc:   PainSc: 6                  Trevor Iha

## 2022-06-02 DIAGNOSIS — K7460 Unspecified cirrhosis of liver: Secondary | ICD-10-CM | POA: Diagnosis not present

## 2022-06-02 DIAGNOSIS — R188 Other ascites: Secondary | ICD-10-CM | POA: Diagnosis not present

## 2022-06-02 DIAGNOSIS — D62 Acute posthemorrhagic anemia: Secondary | ICD-10-CM | POA: Diagnosis not present

## 2022-06-02 DIAGNOSIS — D649 Anemia, unspecified: Secondary | ICD-10-CM | POA: Diagnosis not present

## 2022-06-02 DIAGNOSIS — K766 Portal hypertension: Secondary | ICD-10-CM | POA: Diagnosis not present

## 2022-06-02 DIAGNOSIS — G9341 Metabolic encephalopathy: Secondary | ICD-10-CM | POA: Diagnosis not present

## 2022-06-02 DIAGNOSIS — B169 Acute hepatitis B without delta-agent and without hepatic coma: Secondary | ICD-10-CM

## 2022-06-02 LAB — HEPATITIS B DNA, ULTRAQUANTITATIVE, PCR

## 2022-06-02 LAB — COMPREHENSIVE METABOLIC PANEL
ALT: 42 U/L (ref 0–44)
AST: 82 U/L — ABNORMAL HIGH (ref 15–41)
Albumin: 1.9 g/dL — ABNORMAL LOW (ref 3.5–5.0)
Alkaline Phosphatase: 98 U/L (ref 38–126)
Anion gap: 4 — ABNORMAL LOW (ref 5–15)
BUN: 15 mg/dL (ref 8–23)
CO2: 22 mmol/L (ref 22–32)
Calcium: 7.1 mg/dL — ABNORMAL LOW (ref 8.9–10.3)
Chloride: 107 mmol/L (ref 98–111)
Creatinine, Ser: 0.87 mg/dL (ref 0.61–1.24)
GFR, Estimated: >60 mL/min (ref >60)
Glucose, Bld: 125 mg/dL — ABNORMAL HIGH (ref 70–99)
Potassium: 3.8 mmol/L (ref 3.5–5.1)
Sodium: 133 mmol/L — ABNORMAL LOW (ref 135–145)
Total Bilirubin: 1 mg/dL (ref 0.3–1.2)
Total Protein: 5.4 g/dL — ABNORMAL LOW (ref 6.5–8.1)

## 2022-06-02 LAB — BRAIN NATRIURETIC PEPTIDE: B Natriuretic Peptide: 337.8 pg/mL — ABNORMAL HIGH (ref 0.0–100.0)

## 2022-06-02 LAB — ANTI-SMOOTH MUSCLE ANTIBODY, IGG: F-Actin IgG: 17 Units (ref 0–19)

## 2022-06-02 LAB — CBC WITH DIFFERENTIAL/PLATELET
Abs Immature Granulocytes: 0 10*3/uL (ref 0.00–0.07)
Basophils Absolute: 0 10*3/uL (ref 0.0–0.1)
Basophils Relative: 1 %
Eosinophils Absolute: 0.2 10*3/uL (ref 0.0–0.5)
Eosinophils Relative: 4 %
HCT: 24.5 % — ABNORMAL LOW (ref 39.0–52.0)
Hemoglobin: 8.1 g/dL — ABNORMAL LOW (ref 13.0–17.0)
Immature Granulocytes: 0 %
Lymphocytes Relative: 25 %
Lymphs Abs: 0.9 10*3/uL (ref 0.7–4.0)
MCH: 32.7 pg (ref 26.0–34.0)
MCHC: 33.1 g/dL (ref 30.0–36.0)
MCV: 98.8 fL (ref 80.0–100.0)
Monocytes Absolute: 0.4 10*3/uL (ref 0.1–1.0)
Monocytes Relative: 10 %
Neutro Abs: 2.1 10*3/uL (ref 1.7–7.7)
Neutrophils Relative %: 60 %
Platelets: 50 10*3/uL — ABNORMAL LOW (ref 150–400)
RBC: 2.48 MIL/uL — ABNORMAL LOW (ref 4.22–5.81)
RDW: 22.1 % — ABNORMAL HIGH (ref 11.5–15.5)
WBC: 3.6 10*3/uL — ABNORMAL LOW (ref 4.0–10.5)
nRBC: 0 % (ref 0.0–0.2)

## 2022-06-02 LAB — PROTIME-INR
INR: 1.4 — ABNORMAL HIGH (ref 0.8–1.2)
Prothrombin Time: 17.5 seconds — ABNORMAL HIGH (ref 11.4–15.2)

## 2022-06-02 LAB — HBV REAL-TIME PCR, QUANT
HBV AS IU/ML: 185000000 IU/mL
LOG10 HBV AS IU/ML: 8.267 log10 IU/mL

## 2022-06-02 LAB — GLUCOSE, CAPILLARY
Glucose-Capillary: 111 mg/dL — ABNORMAL HIGH (ref 70–99)
Glucose-Capillary: 122 mg/dL — ABNORMAL HIGH (ref 70–99)
Glucose-Capillary: 139 mg/dL — ABNORMAL HIGH (ref 70–99)
Glucose-Capillary: 123 mg/dL — ABNORMAL HIGH (ref 70–99)

## 2022-06-02 LAB — AMMONIA: Ammonia: 24 umol/L (ref 9–35)

## 2022-06-02 LAB — MITOCHONDRIAL ANTIBODIES: Mitochondrial M2 Ab, IgG: <20 Units (ref 0.0–20.0)

## 2022-06-02 LAB — HEPATITIS B CORE ANTIBODY, IGM: Hep B C IgM: REACTIVE — AB

## 2022-06-02 LAB — ANA: Anti Nuclear Antibody (ANA): POSITIVE — AB

## 2022-06-02 MED ORDER — FUROSEMIDE 10 MG/ML IJ SOLN
20.0000 mg | Freq: Two times a day (BID) | INTRAMUSCULAR | Status: DC
  Administered 2022-06-02 – 2022-06-04 (×4): 20 mg via INTRAVENOUS
  Filled 2022-06-02 (×4): qty 2

## 2022-06-02 NOTE — Progress Notes (Addendum)
Daily Progress Note  DOA: 05/30/2022 Hospital Day: 4 Chief Complaint: anemia, cirrhosis   Assessment and Plan:    Brief Narrative:  Barry [name] is a 67 y.o. year old male with multiple medical problems not limited to  dementia, cirrhosis, colon polyps, CVA, CKD 3, anxiety, morbid obesity, OSA on CPAP,  CAD s/p remote CABG, DM2.. Presented to ED 5/5 with altered mental status, edema, AKI and anemia. We saw him in consult 5/6. Please refer to that note for details.    # Decompensated cirrhosis ( possibly with superimposed Hepatitis B) presenting with possible hepatic encephalopathy , grade II esophageal varices and bleeding 2/2 portal hypertensive gastropathy. MELD 3.0 is 15.  Prior cirrhosis care has been at outside facility.  Hepatic serologic workup for etiology of cirrhosis is in progress ( see under labs section) but may have acute hepatitis B.   -Continue Octreotide x 72 hours ( stop tomorrow afternoon) -Continue Lactulose -Continue empiric Rocephin for 5 days -HCC screening: 06/01/22 [place] -no reported liver lesions. AFP pending.  -IR evaluating for TIPS or BRTO  -Will consult ID about Hepatitis B. Hep B viral load in progress. HCV ab negative. At some point will need to check immunity to HAV.   Addendum: I spoke with ID. They do not recommend treatment of acute hepatitis B until patient has acute liver failure. They recommend outpatient ID follow up but in the interim would recommend additional labs  which I will order.  # Acute on chronic anemia / FOBT +. Secondary to oozing portal hypertensive gastropathy.  Presenting hgb 4.9, improved to 8 post 4 u PRBCs  # Elevated troponin. Thought to be demand ischemia in setting of severe anemia. .     Attending Physician Note   I have taken an interval history, reviewed the chart and examined the patient. I performed a substantive portion of this encounter, including complete performance of at least one of the key components, in  conjunction with the APP. I agree with the APP's note, impression and recommendations with my edits. My additional impressions and recommendations are as follows.   Decompensated cirrhosis, acute hepatitis B, possible hepatic encephalopathy vs other cause of encephalopathy, grade II nonbleeding esophageal varices and bleeding portal hypertensive gastropathy.  MELD 3.0=15. Mgmt discussed with Dr. Lindie Spruce. Main contraindication to TIPS is worsening of possible HE, although he may have a metabolic encephalopathy. There is concern in the IR group about the effectiveness of TIPS for bleeding portal gastropathy although in my experience it is an indication for TIPS. Fortunately he has demonstrated a low grade bleed which has stabilized. Continue octreotide, lactulose, ceftriaxone, pantoprazole. ID contacted and they recommend outpatient ID evaluation and follow up for Hep B.   Acute on chronic anemia, heme + stool secondary to oozing portal gastropathy. Start carvedilol to reduce portal pressures when stable.    Claudette Head, MD [facility] See Loretha Stapler, [facility], for our on call provider     Subjective / New Events:   Feels okay, a little sleepy   Objective:     Recent Labs    06/01/22 0554 06/01/22 1711 06/02/22 0552  WBC 4.5 4.4 3.6*  HGB 8.4* 8.3* 8.1*  HCT 25.2* 25.6* 24.5*  PLT 52* 49* 50*   BMET Recent Labs    05/31/22 0706 06/01/22 0554 06/02/22 0552  NA 133* 133* 133*  K 3.7 3.5 3.8  CL 104 102 107  CO2 23 24 22   GLUCOSE 129* 156* 125*  BUN  29* 18 15  CREATININE 0.96 0.96 0.87  CALCIUM 7.3* 7.2* 7.1*   LFT Recent Labs    06/01/22 0554 06/02/22 0552  PROT 5.7* 5.4*  ALBUMIN 2.1* 1.9*  AST 103* 82*  ALT 48* 42  ALKPHOS 97 98  BILITOT 1.6* 1.0  BILIDIR 0.4*  --   IBILI 1.2*  --    PT/INR Recent Labs    06/01/22 0554 06/02/22 0552  LABPROT 17.2* 17.5*  INR 1.4* 1.4*    Hepatic serologic workup ANA is positive AMA, ASMA, IgG all pending,   Iron studies  negative for iron overload  HCV ab negative.  HAV IgM negative HBV IgM positive   Imaging:  US Abdomen Limited RUQ (LIVER/GB) CLINICAL DATA:  [id number] Cirrhosis (HCC) 92834  EXAM: ULTRASOUND ABDOMEN LIMITED RIGHT UPPER QUADRANT  COMPARISON:  07/21/2018  FINDINGS: Gallbladder:  No gallstones visualized. Mild nonspecific wall thickening measuring 0.8 mm. No Murphy's sign elicited. No pericholecystic fluid.  Common bile duct:  Diameter: 3.6 mm  Liver:  There is coarse heterogeneous echotexture with surface nodularity compatible with cirrhosis. No large focal hepatic abnormality or biliary obstruction pattern. Portal vein is patent on color Doppler imaging with normal direction of blood flow towards the liver.  Other: Surrounding perihepatic ascites noted.  IMPRESSION: 1. Hepatic cirrhosis 2. Perihepatic ascites. 3. Nonspecific gallbladder wall thickening. No gallstones or Murphy's sign.  Electronically Signed   By: Judie Petit.  Shick M.D.   On: 06/01/2022 09:38     Scheduled inpatient medications:   insulin aspart  0-5 Units Subcutaneous QHS   insulin aspart  0-9 Units Subcutaneous TID WC   lactulose  20 g Oral BID   pantoprazole (PROTONIX) IV  40 mg Intravenous Q12H   Continuous inpatient infusions:   sodium chloride     0.9 % NaCl with KCl 40 mEq / L 50 mL/hr at 06/01/22 1812   cefTRIAXone (ROCEPHIN)  IV Stopped (06/01/22 1542)   octreotide (SANDOSTATIN) 500 mcg in sodium chloride 0.9 % 250 mL (2 mcg/mL) infusion 50 mcg/hr (06/02/22 1154)   PRN inpatient medications: ondansetron **OR** ondansetron (ZOFRAN) IV, oxyCODONE, phenol  Vital signs in last 24 hours: Temp:  [97.4 F (36.3 C)-98.2 F (36.8 C)] 98.2 F (36.8 C) (05/08 0458) Pulse Rate:  [76-89] 76 (05/08 0458) Resp:  [13-23] 18 (05/08 0458) BP: (105-129)/(61-86) 113/77 (05/08 0458) SpO2:  [93 %-100 %] 95 % (05/08 0458) Last BM Date : 06/01/22  Intake/Output Summary (Last 24 hours) at 06/02/2022 1202 Last  data filed at 06/02/2022 0607 Gross per 24 hour  Intake 1348.12 ml  Output 1150 ml  Net 198.12 ml    Intake/Output from previous day: 05/07 0701 - 05/08 0700 In: 1548.1 [P.O.:360; I.V.:988.1; IV Piggyback:200] Out: 1650 [Urine:1650] Intake/Output this shift: No intake/output data recorded.   Physical Exam:  General: sleepy male in NAD Heart:  Regular rate and rhythm.  Pulmonary: Normal respiratory effort Abdomen: Soft, obese, nontender. Normal bowel sounds. Extremities: No lower extremity edema  Neurologic: Oriented. No asterixis Psych: Pleasant. Cooperative.     Principal Problem:   Symptomatic anemia Active Problems:   CAD (coronary artery disease)   Obstructive sleep apnea   Morbid obesity due to excess calories (HCC)   Depression with anxiety   Essential hypertension, benign   Hyperlipidemia LDL goal <70   Thrombocytopenia (HCC)   GERD (gastroesophageal reflux disease)   Cirrhosis of liver with ascites (HCC)   OSA (obstructive sleep apnea)   Occult blood in stools  LOS: 3 days   Willette Cluster ,NP 06/02/2022, 12:02 PM

## 2022-06-02 NOTE — Progress Notes (Signed)
[facility] Progress Note  Patient: Barry [name]    HYQ:[id number]  DOA: 05/30/2022    Date of Service: the patient was seen and examined on 06/02/2022  Brief hospital course: Barry [name] is an 67 y.o. male past medical history significant for liver cirrhosis, CVA chronic kidney disease stage III A, CAD with a history of CABG in 2014 brought in from skilled nursing facility on 5/5 for altered mental status and hemoglobin of 4 (last hemoglobin on 05/10/2022 was 9.3), FOBT was positive.  Patient admitted to hospitalist service.  Gastroenterology consulted.  Patient transfused 4 units packed red blood cells.  Patient taken for EGD on 5/7 with grade 2 varices seen, but no signs of active bleeding there and moderate portal hypertensive gastropathy.  However, active oozing and antrum seen.  Plavix and aspirin continued to be held.  IR consulted for TIPS procedure, but given his altered mental status and although with grade 2 varices, no stigmata of recent bleeding, not felt to be a good candidate.  Assessment and Plan: Symptomatic anemia/acute blood loss anemia history of cirrhosis with esophageal varices with portal hypertension and portal gastropathy: Status post 4 units packed red blood cells, hemoglobin 8.4. To keep hemoglobin above 8 has a history of coronary artery disease and CABG GI saw patient, status post EGD.  Some active oozing noted, but no evidence of acute areas of bleeding.  Continue octreotide x 72 hours, which completes on 5/9.  Continued on empiric Rocephin x 5 days.  Suspected acute hepatitis B: Positive hep B antibody for core and surface antigen.  Viral load being checked.  GI discussed with infectious disease who did not recommend acute treatment on this patient with acute liver failure.  They will see in office.   Acute kidney injury-resolved: Possibly prerenal azotemia, with a previous hemoglobin of less than 1 on admission 1.2, currently getting 2 units packed red blood cells  which will help with volume expansion.  KVO IV fluids creatinine has returned to baseline.   Liver cirrhosis: No ascites, ammonia level 26. Continue lactulose p.o. twice daily.  7 for SBP prophylaxis MELD score 3.0  is 17.  Interventional radiology consulted for TIPS procedure, but not felt to be a good candidate.   Acute metabolic encephalopathy: CT head showed no acute findings. Lactulose p.o. twice daily.  Some improvement although still confused   Hypovolemic hyponatremia: Stable 133. Continue to monitor.   Elevated troponins: He denies any chest pain, he did relate some shortness of breath but his troponins are trending down there is likely due to drop in hemoglobin likely demand ischemia. Try to keep hemoglobin 8 greater than 8 he has a history of CABG 2014   Thrombocytopenia: Secondary to liver cirrhosis they are trending up.   Essential hypertension: Continue to hold antihypertensive medication.   GERD: Continue PPI.   Obstructive sleep apnea: Continue CPAP at night.   Hyperlipidemia:  continue statins.  Morbid obesity Meets criteria BMI greater than 40      Body mass index is 42.91 kg/m.        Consultants: Gastroenterology Case discussed with infectious disease Interventional radiology  Procedures: Status post EGD  Antimicrobials: IV Rocephin 5/5-present  Code Status: Full code   Subjective: Confused, denies any pain  Objective: Vital signs were reviewed and unremarkable. Vitals:   06/02/22 0458 06/02/22 1302  BP: 113/77 (!) 147/73  Pulse: 76 80  Resp: 18 16  Temp: 98.2 F (36.8 C) 97.8 F (36.6 C)  SpO2: 95% 96%    Intake/Output Summary (Last 24 hours) at 06/02/2022 1622 Last data filed at 06/02/2022 1050 Gross per 24 hour  Intake 1548.12 ml  Output 1450 ml  Net 98.12 ml   Filed Weights   05/30/22 1515 06/01/22 1105  Weight: 113.4 kg 113.4 kg   Body mass index is 42.91 kg/m.  Exam:  General: Awake, oriented x 1 HEENT:  Normocephalic, atraumatic, mucous membranes are moist Cardiovascular: Regular rate and rhythm, S1-S2 Respiratory: Clear to auscultation bilaterally Abdomen: Soft, mild distention, nontender, hypoactive bowel sounds Musculoskeletal: No clubbing or cyanosis or edema Skin: No skin breaks, tears or lesions Psychiatry: Acutely confused Neurology: No focal deficits  Data Reviewed: Sodium of 133, normal creatinine, albumin of 1.9, AST of 82, BNP of 338 Positive antibody for hepatitis B core and surface antigen.  Disposition:  Status is: Inpatient Remains inpatient appropriate because:  -Continued octreotide -Continued Rocephin -Improvement in mentation    Anticipated discharge date: 5/9  Family Communication: Will call family DVT Prophylaxis: SCDs Start: 05/30/22 2051    Author: Hollice Espy ,MD 06/02/2022 4:22 PM  To reach On-call, see care teams to locate the attending and reach out via [email or web address]. Between 7PM-7AM, please contact night-coverage If you still have difficulty reaching the attending provider, please page the [facility] (Director on Call) for [facility] on amion for assistance.

## 2022-06-02 NOTE — TOC Progression Note (Signed)
Transition of Care [facility]) - Progression Note    Patient Details  Name: Barry [name] MRN: [id number] Date of Birth: 1955/06/05  Transition of Care [facility]) CM/SW Contact  Otelia Santee, LCSW Phone Number: 06/02/2022, 1:36 PM  Clinical Narrative:    Pt potentially medically ready to discharge tomorrow. Insurance authorization has been requested. Currently pending approval.    Expected Discharge Plan: Skilled Nursing Facility Barriers to Discharge: Continued Medical Work up  Expected Discharge Plan and Services                                               Social Determinants of Health (SDOH) Interventions SDOH Screenings   Food Insecurity: No Food Insecurity (04/24/2022)  Housing: Low Risk  (04/24/2022)  Transportation Needs: No Transportation Needs (04/24/2022)  Utilities: Not At Risk (04/24/2022)  Depression (PHQ2-9): Low Risk  (05/16/2019)  Tobacco Use: Low Risk  (06/01/2022)    Readmission Risk Interventions    06/02/2022    1:36 PM  Readmission Risk Prevention Plan  Transportation Screening Complete  PCP or Specialist Appt within 5-7 Days Complete  Home Care Screening Complete  Medication Review (RN CM) Complete

## 2022-06-02 NOTE — Progress Notes (Addendum)
Patient ID: Barry [name], male   DOB: December 02, 1955, 67 y.o.   MRN: [id number] Pt currently eating ; denies fever, CP,dyspnea, cough, abd pain,N/V ; last BM last night per nurse was tarry; no BM today; no coughing of blood; he is awake, alert to name, birthdate, location; states month is April and date 23rd. Afebrile, BP ok, ammonia normal, creat normal, t bili normal, WBC 3.6, hgb 8.1(8.3), plts 50k, hep B surf Ag/core antib IgM reactive; CT angio A/P today revealed:   VASCULAR   1. Portal venous system is patent. 2. Small esophageal varices. No significant gastric varices. No significant portosystemic or gastrorenal shunt. 3. Mild atherosclerotic disease in the abdomen and pelvis. Main visceral arteries are patent.   NON-VASCULAR   1. Cirrhosis with evidence of portal hypertension demonstrated by splenomegaly and ascites. 2. Small bilateral pleural effusions. Peripheral reticular densities in the visualized lungs that could represent chronic changes such as scarring or fibrosis. 3. Tiny bilateral renal calculi   Images were reviewed by Drs. Henn/Watts; pt's mental status has improved since yesterday but there are still some lapses in memory( friend also said his speech was slurred prior to recent hospital arrival and that he will occ drift off to sleep during conversation); he remains on lactulose; previous bleeding likely arising from portal gastropathy and not small grade II esoph varices; pt does not appear to be good candidate for TIPS as this does not help with bleeding from portal gastropathy.TIPS creation would also likely worsen any mental status changes.  Would cont current medical management, lab checks, CPAP for sleep apnea, ID consult for hepatitis serology results. Consider MRI brain .

## 2022-06-02 NOTE — Plan of Care (Signed)

## 2022-06-03 ENCOUNTER — Other Ambulatory Visit (HOSPITAL_COMMUNITY): Payer: Self-pay

## 2022-06-03 ENCOUNTER — Telehealth (HOSPITAL_COMMUNITY): Payer: Self-pay

## 2022-06-03 ENCOUNTER — Inpatient Hospital Stay (HOSPITAL_COMMUNITY): Payer: Medicare HMO

## 2022-06-03 ENCOUNTER — Encounter (HOSPITAL_COMMUNITY): Payer: Self-pay | Admitting: Gastroenterology

## 2022-06-03 ENCOUNTER — Telehealth: Payer: Self-pay

## 2022-06-03 ENCOUNTER — Encounter: Payer: Self-pay | Admitting: Family

## 2022-06-03 DIAGNOSIS — D649 Anemia, unspecified: Secondary | ICD-10-CM | POA: Diagnosis not present

## 2022-06-03 DIAGNOSIS — K3189 Other diseases of stomach and duodenum: Secondary | ICD-10-CM

## 2022-06-03 DIAGNOSIS — K766 Portal hypertension: Secondary | ICD-10-CM

## 2022-06-03 DIAGNOSIS — I8500 Esophageal varices without bleeding: Secondary | ICD-10-CM | POA: Diagnosis not present

## 2022-06-03 DIAGNOSIS — D500 Iron deficiency anemia secondary to blood loss (chronic): Secondary | ICD-10-CM

## 2022-06-03 DIAGNOSIS — B1920 Unspecified viral hepatitis C without hepatic coma: Secondary | ICD-10-CM

## 2022-06-03 DIAGNOSIS — D62 Acute posthemorrhagic anemia: Secondary | ICD-10-CM | POA: Diagnosis not present

## 2022-06-03 DIAGNOSIS — R188 Other ascites: Secondary | ICD-10-CM | POA: Diagnosis not present

## 2022-06-03 DIAGNOSIS — K7460 Unspecified cirrhosis of liver: Secondary | ICD-10-CM | POA: Diagnosis not present

## 2022-06-03 LAB — GLUCOSE, CAPILLARY
Glucose-Capillary: 108 mg/dL — ABNORMAL HIGH (ref 70–99)
Glucose-Capillary: 140 mg/dL — ABNORMAL HIGH (ref 70–99)
Glucose-Capillary: 152 mg/dL — ABNORMAL HIGH (ref 70–99)
Glucose-Capillary: 174 mg/dL — ABNORMAL HIGH (ref 70–99)

## 2022-06-03 LAB — COMPREHENSIVE METABOLIC PANEL
ALT: 39 U/L (ref 0–44)
AST: 74 U/L — ABNORMAL HIGH (ref 15–41)
Albumin: 1.9 g/dL — ABNORMAL LOW (ref 3.5–5.0)
Alkaline Phosphatase: 101 U/L (ref 38–126)
Anion gap: 5 (ref 5–15)
BUN: 12 mg/dL (ref 8–23)
CO2: 24 mmol/L (ref 22–32)
Calcium: 7.3 mg/dL — ABNORMAL LOW (ref 8.9–10.3)
Chloride: 105 mmol/L (ref 98–111)
Creatinine, Ser: 0.88 mg/dL (ref 0.61–1.24)
GFR, Estimated: >60 mL/min (ref >60)
Glucose, Bld: 118 mg/dL — ABNORMAL HIGH (ref 70–99)
Potassium: 4 mmol/L (ref 3.5–5.1)
Sodium: 134 mmol/L — ABNORMAL LOW (ref 135–145)
Total Bilirubin: 0.9 mg/dL (ref 0.3–1.2)
Total Protein: 5.6 g/dL — ABNORMAL LOW (ref 6.5–8.1)

## 2022-06-03 LAB — CBC
HCT: 25.7 % — ABNORMAL LOW (ref 39.0–52.0)
Hemoglobin: 8.2 g/dL — ABNORMAL LOW (ref 13.0–17.0)
MCH: 31.7 pg (ref 26.0–34.0)
MCHC: 31.9 g/dL (ref 30.0–36.0)
MCV: 99.2 fL (ref 80.0–100.0)
Platelets: 53 10*3/uL — ABNORMAL LOW (ref 150–400)
RBC: 2.59 MIL/uL — ABNORMAL LOW (ref 4.22–5.81)
RDW: 20.9 % — ABNORMAL HIGH (ref 11.5–15.5)
WBC: 3.7 10*3/uL — ABNORMAL LOW (ref 4.0–10.5)
nRBC: 0 % (ref 0.0–0.2)

## 2022-06-03 LAB — IGG: IgG (Immunoglobin G), Serum: 1988 mg/dL — ABNORMAL HIGH (ref 603–1613)

## 2022-06-03 LAB — HEPATITIS B E ANTIBODY: Hep B E Ab: NONREACTIVE

## 2022-06-03 LAB — HEPATITIS B E ANTIGEN: Hep B E Ag: POSITIVE — AB

## 2022-06-03 LAB — BRAIN NATRIURETIC PEPTIDE: B Natriuretic Peptide: 357.1 pg/mL — ABNORMAL HIGH (ref 0.0–100.0)

## 2022-06-03 LAB — AFP TUMOR MARKER: AFP, Serum, Tumor Marker: 2.3 ng/mL (ref 0.0–8.4)

## 2022-06-03 MED ORDER — LIP MEDEX EX OINT
TOPICAL_OINTMENT | CUTANEOUS | Status: DC | PRN
  Filled 2022-06-03: qty 7

## 2022-06-03 MED ORDER — CARVEDILOL 3.125 MG PO TABS
3.1250 mg | ORAL_TABLET | Freq: Every day | ORAL | Status: DC
  Administered 2022-06-03: 3.125 mg via ORAL
  Filled 2022-06-03: qty 1

## 2022-06-03 MED ORDER — PANTOPRAZOLE SODIUM 40 MG PO TBEC
40.0000 mg | DELAYED_RELEASE_TABLET | Freq: Two times a day (BID) | ORAL | Status: DC
  Administered 2022-06-03 – 2022-06-05 (×4): 40 mg via ORAL
  Filled 2022-06-03 (×4): qty 1

## 2022-06-03 NOTE — TOC Benefit Eligibility Note (Signed)
Patient Product/process development scientist completed.    The patient is currently admitted and upon discharge could be taking Viread 300mg .  The current 30 day co-pay is $26.70.   The patient is currently admitted and upon discharge could be taking Vemlidy 25mg .  The current 30 day co-pay is $447.18.   The patient is insured through [name]   This test claim was processed through [facility]- copay amounts may vary at other pharmacies due to pharmacy/plan contracts, or as the patient moves through the different stages of their insurance plan.

## 2022-06-03 NOTE — Telephone Encounter (Signed)
Called pt. Unable to make contact.   Pt needs an OV for [facility] Clearance form to be completed and signed.   Upon pt's return call please schedule him to see PCP.

## 2022-06-03 NOTE — Progress Notes (Signed)
RUE venous duplex has been completed.   Results can be found under chart review under CV PROC. 06/03/2022 12:38 PM Weltha Cathy RVT, RDMS

## 2022-06-03 NOTE — Consult Note (Addendum)
[facility]                                                                                        Patient Identification: Patient Name: Barry [name] MRN: [id number] Admit Date: 05/30/2022  2:59 PM Today's Date: 06/03/2022 Reason for consult: Acute hepatitis B Requesting provider: Dr. Lajuana Ripple  Principal Problem:   Symptomatic anemia Active Problems:   CAD (coronary artery disease)   Obstructive sleep apnea   Morbid obesity due to excess calories (HCC)   Depression with anxiety   Essential hypertension, benign   Hyperlipidemia LDL goal <70   Thrombocytopenia (HCC)   GERD (gastroesophageal reflux disease)   Cirrhosis of liver with ascites (HCC)   OSA (obstructive sleep apnea)   Occult blood in stools   Antibiotics:  Ceftriaxone 5/6-c  Lines/Hardware:  Assessment 67 year old male with prior history of liver cirrhosis, pancreatitis, CAD s/p CABG, HLD, morbid obesity/, HTN,, type II DM with neuropathy, CVA, sarcoidosis of skin, cognitive impairment who presented to the ED on 5/5 with hb 4 and altered mental status.   # Acute blood loss anemia secondary to decompensated liver cirrhosis with esophageal varices with portal hypertension and portal gastropathy  # Acute hepatitis B versus acute flare of chronic hepatitis B decompensated liver cirrhosis Usually treatment of acute hepatitis B is supportive however, indicated if concerns for acute liver failure  He meets criteria for treatment of Hepatitis B and would need to be started on tx ASAP under expert guidance of hepatologist in a tertiary facility ideally. I have communicated my recs to both hospitalist and GI team 3/25 HIV NR 3/26 Hepatitis B surface ag R, HCV ab NR 5/7 hepatitis B surface ag R, hep B core ag IgM R, Hep B core total ab reactive, hep B surface ab NR, Heop B e ag positive, e ab NR  # Pancytopenia 2/2 above  # hepatic  encephalopathy - appears to be improving   Recommendations  Patient meets criteria for treatment. It appears GI team is planning to start treatment with Viread IP as transfer to a tertiary facility seems difficult currently. I would defer HBV treatment to GI team if treatment started IP as it is beyond my scope of practice with decompensated cirrhosis.   Will check HAV ab and if NR, patient needs to be vaccinated for HBV  Will also need HCC screening with [place] every 6 months Complete SBP prophyalxis with ceftriaxone for 7 days  ID available as needed, please call with questions   Rest of the management as per the primary team. Please call with questions or concerns.  Thank you for the consult  __________________________________________________________________________________________________________ HPI and Hospital Course: 67 year old male with prior history of liver cirrhosis, pancreatitis, CAD s/p CABG, HLD, morbid obesity/, HTN,, type II DM with neuropathy, CVA, sarcoidosis of skin, cognitive impairment who presented to the ED on 5/5 with hb 4 and altered mental status.  At ED, afebrile Labs remarkable for na 133, AKI with creatinine 1.25, albumin 2.1, AST 117, ALT 50, T. bili 1.3, troponin 139, WBC 6.8, hemoglobin 4.4, platelets 83 FOBT+ S/p multiple PRBC transfusion Imaging  reviewed  GI following EGD 5/7 grade 2 esophageal varices, nonbleeding.  Portal hypertensive gastropathy with active oozing in the antrum.  Normal duodenal bulb and second portion of the duodenum.  IR was consulted for TIPS however it was deferred due to his altered mental status ID consulted for acute hepatitis B  He complains of rt hip pain and asking for something to help with it. He denies drinking alcohol, smoking as well as IVDU. He is also unaware of prior Hep B diagnosis or ever receiving treatment for it.   ROS: rt hip pain, seems to be chronic, limited. Denies abdominal pain, nausea, vomiting   Past  Medical History:  Diagnosis Date   Anemia    Anxiety    Arthritis    Cerebrovascular disease    Cervical disc disorder    Chronic kidney disease    Cirrhosis of liver (HCC)    Coronary artery disease    Depression    Dyspnea    with exertion   Esophageal varices (HCC)    hx of   Family history of colon cancer 10/15/2019   GERD (gastroesophageal reflux disease)    Headache    Hyperlipidemia    Hyperlipidemia    Hypocalcemia    MI (myocardial infarction) (HCC) 04/23/2007   inferior wall   Morbid obesity (HCC) 06/26/2012   Neuropathy    secondary to diabetes   Pancreatitis    hs of   S/P CABG x 3 07/04/2012   LIMA to LAD, SVG to D1, SVG to PDA, EVH via right thigh   Sarcoidosis of skin    Sleep apnea    no cpap   Stroke (HCC)    small stroke - 2023 - legs weak , righ tleg weak   Type II or unspecified type diabetes mellitus without mention of complication, not stated as uncontrolled    Unspecified essential hypertension    Past Surgical History:  Procedure Laterality Date   CORONARY ANGIOPLASTY WITH STENT PLACEMENT  04/23/2007   PCI and stenting of mid RCA - Dr Bary Castilla @ [facility]   CORONARY ARTERY BYPASS GRAFT N/A 07/04/2012   Procedure: CORONARY ARTERY BYPASS GRAFTING (CABG);  Surgeon: [name], MD;  Location: [facility] OR;  Service: Open Heart Surgery;  Laterality: N/A;  x3 using right greater saphenous vein and left internal mammary.    INTRAOPERATIVE TRANSESOPHAGEAL ECHOCARDIOGRAM N/A 07/04/2012   Procedure: INTRAOPERATIVE TRANSESOPHAGEAL ECHOCARDIOGRAM;  Surgeon: [name], MD;  Location: [facility] OR;  Service: Open Heart Surgery;  Laterality: N/A;   LEFT HEART CATH AND CORS/GRAFTS ANGIOGRAPHY N/A 04/18/2017   Procedure: LEFT HEART CATH AND CORS/GRAFTS ANGIOGRAPHY;  Surgeon: [name], MD;  Location: [facility] INVASIVE CV LAB;  Service: Cardiovascular;  Laterality: N/A;   LEFT HEART CATHETERIZATION WITH CORONARY ANGIOGRAM N/A 06/25/2012   Procedure: LEFT HEART  CATHETERIZATION WITH CORONARY ANGIOGRAM;  Surgeon: [name], MD;  Location: [facility] CATH LAB;  Service: Cardiovascular;  Laterality: N/A;   TOOTH EXTRACTION  04/2018   4 teeth pulled    Scheduled Meds:  furosemide  20 mg Intravenous BID   insulin aspart  0-5 Units Subcutaneous QHS   insulin aspart  0-9 Units Subcutaneous TID WC   lactulose  20 g Oral BID   pantoprazole (PROTONIX) IV  40 mg Intravenous Q12H   Continuous Infusions:  cefTRIAXone (ROCEPHIN)  IV 2 g (06/02/22 1428)   octreotide (SANDOSTATIN) 500 mcg in sodium chloride 0.9 % 250 mL (2 mcg/mL) infusion 50 mcg/hr (06/03/22 0129)  PRN Meds:.lip balm, ondansetron **OR** ondansetron (ZOFRAN) IV, oxyCODONE, phenol  No Known Allergies  Social History   Socioeconomic History   Marital status: Divorced    Spouse name: Not on file   Number of children: 1   Years of education: Not on file   Highest education level: Not on file  Occupational History   Occupation: MAINTENANCE    Employer: [employer]  Tobacco Use   Smoking status: Never   Smokeless tobacco: Never  Vaping Use   Vaping Use: Never used  Substance and Sexual Activity   Alcohol use: No    Alcohol/week: 0.0 standard drinks of alcohol   Drug use: No   Sexual activity: Not Currently    Partners: Female  Other Topics Concern   Not on file  Social History Narrative   Lives alone in a one story home.  Has one daughter.  Works as a Armed forces training and education officer.  Education: high school.    Social Determinants of Health   Financial Resource Strain: Not on file  Food Insecurity: No Food Insecurity (04/24/2022)   Hunger Vital Sign    Worried About Running Out of Food in the Last Year: Never true    Ran Out of Food in the Last Year: Never true  Transportation Needs: No Transportation Needs (04/24/2022)   PRAPARE - Administrator, Civil Service (Medical): No    Lack of Transportation (Non-Medical): No  Physical Activity: Not on file  Stress: Not on  file  Social Connections: Not on file  Intimate Partner Violence: Not At Risk (04/24/2022)   Humiliation, Afraid, Rape, and Kick questionnaire    Fear of Current or Ex-Partner: No    Emotionally Abused: No    Physically Abused: No    Sexually Abused: No   Family History  Problem Relation Age of Onset   Cancer Mother        unknown type; dx late 31s   Cancer Father        unknown type; dx > 50yo   Colon cancer Brother        dx late 17s   Colon cancer Brother        dx late 30s; dx 36   Cancer Sister        unknown type; dx late 35s   Diabetes Sister    Diabetes Brother    Cancer Maternal Aunt        unknown type; dx > 50yo    Vitals BP 121/75 (BP Location: Left Arm)   Pulse 82   Temp 98.7 F (37.1 C)   Resp 20   Ht 5\' 4"  (1.626 m)   Wt 113.4 kg   SpO2 100%   BMI 42.91 kg/m   Physical Exam Constitutional: Elderly adult male lying in the bed, not in acute distress    comments:   Cardiovascular:     Rate and Rhythm: Normal rate and regular rhythm.     Heart sounds: S1 and S2 Pulmonary:     Effort: Pulmonary effort is normal.     Comments: Normal breath sounds  Abdominal:     Palpations: Abdomen is soft.     Tenderness: Nondistended and nontender  Musculoskeletal:        General: No swelling or tenderness in peripheral joints   Skin:    Comments: No rashes   Neurological:     General: awake, alert and oriented to place, year and month,  grossly non focal, follows some basic commands,  but he is unsure how we ended up in the hospital    Psychiatric:        Mood and Affect: Mood normal.    Pertinent Microbiology Results for orders placed or performed during the hospital encounter of 05/10/22  SARS Coronavirus 2 by RT PCR (hospital order, performed in [facility] hospital lab) *cepheid single result test* Anterior Nasal Swab     Status: None   Collection Time: 05/10/22  2:41 PM   Specimen: Anterior Nasal Swab  Result Value Ref Range Status   SARS  Coronavirus 2 by RT PCR NEGATIVE NEGATIVE Final    Comment: (NOTE) SARS-CoV-2 target nucleic acids are NOT DETECTED.  The SARS-CoV-2 RNA is generally detectable in upper and lower respiratory specimens during the acute phase of infection. The lowest concentration of SARS-CoV-2 viral copies this assay can detect is 250 copies / mL. A negative result does not preclude SARS-CoV-2 infection and should not be used as the sole basis for treatment or other patient management decisions.  A negative result may occur with improper specimen collection / handling, submission of specimen other than nasopharyngeal swab, presence of viral mutation(s) within the areas targeted by this assay, and inadequate number of viral copies (<250 copies / mL). A negative result must be combined with clinical observations, patient history, and epidemiological information.  Fact Sheet for Patients:   RoadLapTop.co.za  Fact Sheet for Healthcare Providers: http://kim-miller.com/  This test is not yet approved or  cleared by the [place] FDA and has been authorized for detection and/or diagnosis of SARS-CoV-2 by FDA under an Emergency Use Authorization (EUA).  This EUA will remain in effect (meaning this test can be used) for the duration of the COVID-19 declaration under Section 564(b)(1) of the Act, 21 U.S.C. section 360bbb-3(b)(1), unless the authorization is terminated or revoked sooner.  Performed at [facility], [address] [place], [place] [address]     Pertinent Lab seen by me:    Latest Ref Rng & Units 06/03/2022    5:37 AM 06/02/2022    5:52 AM 06/01/2022    5:11 PM  CBC  WBC 4.0 - 10.5 K/uL 3.7  3.6  4.4   Hemoglobin 13.0 - 17.0 g/dL 8.2  8.1  8.3   Hematocrit 39.0 - 52.0 % 25.7  24.5  25.6   Platelets 150 - 400 K/uL 53  50  49       Latest Ref Rng & Units 06/03/2022    5:37 AM 06/02/2022    5:52 AM 06/01/2022    5:54 AM  CMP   Glucose 70 - 99 mg/dL 191  478  295   BUN 8 - 23 mg/dL 12  15  18    Creatinine 0.61 - 1.24 mg/dL 6.21  3.08  6.57   Sodium 135 - 145 mmol/L 134  133  133   Potassium 3.5 - 5.1 mmol/L 4.0  3.8  3.5   Chloride 98 - 111 mmol/L 105  107  102   CO2 22 - 32 mmol/L 24  22  24    Calcium 8.9 - 10.3 mg/dL 7.3  7.1  7.2   Total Protein 6.5 - 8.1 g/dL 5.6  5.4  5.7   Total Bilirubin 0.3 - 1.2 mg/dL 0.9  1.0  1.6   Alkaline Phos 38 - 126 U/L 101  98  97   AST 15 - 41 U/L 74  82  103   ALT 0 - 44 U/L 39  42  48      Pertinent Imagings/Other Imagings Plain films and CT images have been personally visualized and interpreted; radiology reports have been reviewed. Decision making incorporated into the Impression / Recommendations.  CT ANGIO ABD/PELVIS BRTO  Result Date: 06/02/2022 CLINICAL DATA:  Evaluate anatomy for potential TIPS procedure. EXAM: CTA ABDOMEN AND PELVIS WITHOUT AND WITH CONTRAST TECHNIQUE: Multidetector CT imaging of the abdomen and pelvis was performed using the standard protocol during bolus administration of intravenous contrast. Multiplanar reconstructed images and MIPs were obtained and reviewed to evaluate the vascular anatomy. RADIATION DOSE REDUCTION: This exam was performed according to the departmental dose-optimization program which includes automated exposure control, adjustment of the mA and/or kV according to patient size and/or use of iterative reconstruction technique. CONTRAST:  OMNIPAQUE IOHEXOL 350 MG/ML SOLN COMPARISON:  Abdominal ultrasound 06/01/2022 FINDINGS: VASCULAR Aorta: Mild atherosclerotic disease in the abdominal aorta without aneurysm, dissection or significant stenosis. Celiac: Patent without evidence of aneurysm, dissection, vasculitis or significant stenosis. SMA: Patent without evidence of aneurysm, dissection, vasculitis or significant stenosis. Renals: Bilateral renal arteries are patent without aneurysm, dissection or significant stenosis. Small  accessory left renal artery. IMA: Patent without evidence of aneurysm, dissection, vasculitis or significant stenosis. Inflow: Common, internal and external iliac arteries are patent without aneurysm, dissection or significant stenosis. Focal atherosclerotic plaque in the distal right common iliac artery. Proximal Outflow: Proximal femoral arteries are patent bilaterally. Portal venous system: Main portal vein is patent. Left and right portal veins are patent. Main portal vein measures approximately 1.5 cm. Splenic vein is patent. SMV is patent. Probable small esophageal varices. No significant gastric varices. IVC and renal veins: Normal caliber of the IVC. Bilateral renal veins are patent. There is no significant portosystemic shunt associated with the left renal vein. Pelvic veins: Bilateral common and external iliac veins are patent. Review of the MIP images confirms the above findings. NON-VASCULAR Lower chest: Trace bilateral pleural effusions. Patchy peripheral reticular densities in the visualized lungs. Some of this could represent atelectasis but some of this may also represent scarring or fibrotic changes. Prior median sternotomy. Hepatobiliary: Nodular contour of the liver is compatible with cirrhosis. No discrete liver lesion. No gross abnormality to the gallbladder. No biliary dilatation. Pancreas: Unremarkable. No pancreatic ductal dilatation or surrounding inflammatory changes. Spleen: Spleen is enlarged measuring 16.7 cm in AP dimension. Adrenals/Urinary Tract: Normal adrenal glands. There is a punctate calcification in each kidney that probably represents tiny renal stones. No hydronephrosis. No suspicious renal lesion. Normal appearance of the urinary bladder. Stomach/Bowel: Normal appearance of the stomach. No bowel dilatation. No evidence for bowel obstruction. No focal bowel inflammation. Lymphatic: No significant lymph node enlargement in the abdomen or pelvis. Reproductive: Prostate is  unremarkable. Other: Small to moderate amount of ascites in the abdomen and pelvis. Subcutaneous edema. Evidence for hernia repair in the ventral abdomen with a small amount of mesh material. Musculoskeletal: Severe joint space loss with subchondral cyst formations in the superior right hip joint. Findings compatible with severe right hip osteoarthritis. Mild levoscoliosis in lumbar spine. Disc space narrowing at L3-L4 and L4-L5. IMPRESSION: VASCULAR 1. Portal venous system is patent. 2. Small esophageal varices. No significant gastric varices. No significant portosystemic or gastrorenal shunt. 3. Mild atherosclerotic disease in the abdomen and pelvis. Main visceral arteries are patent. NON-VASCULAR 1. Cirrhosis with evidence of portal hypertension demonstrated by splenomegaly and ascites. 2. Small bilateral pleural effusions. Peripheral reticular densities in the visualized lungs that could represent chronic changes such as  scarring or fibrosis. 3. Tiny bilateral renal calculi. Electronically Signed   By: Richarda Overlie M.D.   On: 06/02/2022 12:04   US Abdomen Limited RUQ (LIVER/GB)  Result Date: 06/01/2022 CLINICAL DATA:  [id number] Cirrhosis (HCC) [id number] EXAM: ULTRASOUND ABDOMEN LIMITED RIGHT UPPER QUADRANT COMPARISON:  07/21/2018 FINDINGS: Gallbladder: No gallstones visualized. Mild nonspecific wall thickening measuring 0.8 mm. No Murphy's sign elicited. No pericholecystic fluid. Common bile duct: Diameter: 3.6 mm Liver: There is coarse heterogeneous echotexture with surface nodularity compatible with cirrhosis. No large focal hepatic abnormality or biliary obstruction pattern. Portal vein is patent on color Doppler imaging with normal direction of blood flow towards the liver. Other: Surrounding perihepatic ascites noted. IMPRESSION: 1. Hepatic cirrhosis 2. Perihepatic ascites. 3. Nonspecific gallbladder wall thickening. No gallstones or Murphy's sign. Electronically Signed   By: Judie Petit.  Shick M.D.   On: 06/01/2022 09:38    CT HEAD WO CONTRAST ( )  Result Date: 05/30/2022 CLINICAL DATA:  Anemia, altered level of consciousness EXAM: CT HEAD WITHOUT CONTRAST TECHNIQUE: Contiguous axial images were obtained from the base of the skull through the vertex without intravenous contrast. RADIATION DOSE REDUCTION: This exam was performed according to the departmental dose-optimization program which includes automated exposure control, adjustment of the mA and/or kV according to patient size and/or use of iterative reconstruction technique. COMPARISON:  05/10/2022 FINDINGS: Brain: No acute infarct or hemorrhage. Lateral ventricles and midline structures are stable. No acute extra-axial fluid collections. No mass effect. Vascular: Stable atherosclerosis.  No hyperdense vessel. Skull: Normal. Negative for fracture or focal lesion. Sinuses/Orbits: Right ocular prosthesis. Mucosal thickening within the ethmoid and frontal sinuses. Other: None. IMPRESSION: 1. No acute intracranial process. Electronically Signed   By: Sharlet Salina M.D.   On: 05/30/2022 21:20   DG Chest Port 1 View  Result Date: 05/30/2022 CLINICAL DATA:  Shortness of breath.  Altered mental status. EXAM: PORTABLE CHEST 1 VIEW COMPARISON:  05/10/2022 FINDINGS: Previous median sternotomy and CABG. Pulmonary venous hypertension. Possible mild interstitial edema, small effusions and volume loss in the lower lungs. IMPRESSION: Suspicion of fluid overload/mild congestive heart failure. Electronically Signed   By: Paulina Fusi M.D.   On: 05/30/2022 16:48   CT Head Wo Contrast  Result Date: 05/10/2022 CLINICAL DATA:  Mental status change of unknown cause. EXAM: CT HEAD WITHOUT CONTRAST TECHNIQUE: Contiguous axial images were obtained from the base of the skull through the vertex without intravenous contrast. RADIATION DOSE REDUCTION: This exam was performed according to the departmental dose-optimization program which includes automated exposure control, adjustment of the mA  and/or kV according to patient size and/or use of iterative reconstruction technique. COMPARISON:  04/18/2022 FINDINGS: Brain: No evidence of acute infarction, hemorrhage, hydrocephalus, extra-axial collection or mass lesion/mass effect. Vascular: No hyperdense vessel or unexpected calcification. Skull: Normal. Negative for fracture or focal lesion. Sinuses/Orbits: Stable prosthetic right globe. Left globe and orbit are unremarkable. Significant sinus disease. Moderate mucosal thickening with dependent fluid noted in the maxillary sinuses. Moderate to marked mucosal thickening noted throughout the ethmoid air cells, many of which are opacified. Small sphenoid sinuses with mucosal thickening, left with dependent fluid. Dependent fluid in the left frontal sinus. Other: None. IMPRESSION: 1. No acute intracranial abnormalities. 2. Significant sinus disease as detailed including fluid levels. Consider acute sinusitis in the proper clinical setting. Electronically Signed   By: Amie Portland M.D.   On: 05/10/2022 15:10   DG Chest Port 1 View  Result Date: 05/10/2022 CLINICAL DATA:  Altered mental status EXAM: PORTABLE CHEST -  1 VIEW COMPARISON:  04/18/2022 FINDINGS: Patchy interstitial opacities in the lung bases slightly improved since previous. Heart size upper limits normal. CABG markers. Aortic Atherosclerosis (ICD10-170.0). Chronic blunting of left lateral costophrenic angle. Sternotomy wires. IMPRESSION: Slight improvement in bibasilar interstitial opacities. Electronically Signed   By: Corlis Leak M.D.   On: 05/10/2022 14:37  ]   I have personally spent 90  minutes involved in face-to-face and non-face-to-face activities for this patient on the day of the visit. Professional time spent includes the following activities: Preparing to see the patient (review of tests), Obtaining and/or reviewing separately obtained history (admission/discharge record), Performing a medically appropriate examination and/or  evaluation , Ordering medications/tests/procedures, referring and communicating with other health care professionals, Documenting clinical information in the EMR, Independently interpreting results (not separately reported), Communicating results to the patient/family/caregiver, Counseling and educating the patient/family/caregiver and Care coordination (not separately reported).  Electronically signed by:   Plan d/w requesting provider as well as ID pharm D  Note: This document was prepared using dragon voice recognition software and may include unintentional dictation errors.   Odette Fraction, MD Infectious Disease Physician [facility] Pager: [phone]

## 2022-06-03 NOTE — Progress Notes (Addendum)
Daily Progress Note  DOA: 05/30/2022 Hospital Day: 5 Chief Complaint: anemia cirrhosis   Assessment and Plan:    Brief Narrative:   Barry [name] is a 67 y.o. year old male with multiple medical problems not limited to  dementia, cirrhosis ( previously followed at outside facility), colon polyps, CVA, CKD 3, anxiety, morbid obesity, OSA on CPAP,  CAD s/p remote CABG, DM2.. Presented to ED 5/5 with altered mental status, edema, AKI and anemia. We saw him in consult 5/6. Please refer to that note for details.   # Decompensated cirrhosis / Hepatitis B ( acute vrs flare of chronic infection). Presented with mental status changes (possibly hepatic encephalopathy) , grade II esophageal varices and bleeding 2/2 portal hypertensive gastropathy. MELD 3.0 is 15.  Hepatic serologic workup for etiology of cirrhosis has been done ( see under labs section). Labs consistent with acute Hep B .  High Viral load 185,000,000.  Hep Be Ag is positive. -HCC screening: 06/01/22 [place] -no reported liver lesions. AFP normal.  -Will discontinue Octreotide  -Start  Coreg 6.25 daily if admitting team has no concerns -Continue BID Lactulose -Continue BID Pantoprazole, will change to PO form.  -Continue empiric Rocephin for 5 days ( on day # 4) -IR evaluated for TIPS but didn't recommend one   -Spoke with ID and they recommending transfer to tertiary care center for acute or acute on chronic HBV in patient with decompensation. Patient is improving. We plan to initiate Hep B medication inpatient. I spoke with ID Pharmacist Lanora Manis who will be seeing patient in consultation. Sounds like Viread 300 mg daily is a good option for him. Cost is about 26 dollars a month.    # Acute on chronic anemia / FOBT +. Secondary to oozing portal hypertensive gastropathy.  Presenting hgb 4.9, improved after 4 u PRBCs. Hgb stable at 8.2   Attending Physician Note   I have taken an interval history, reviewed the chart and examined the  patient. I performed a substantive portion of this encounter, including complete performance of at least one of the key components, in conjunction with the APP. I agree with the APP's note, impression and recommendations with my edits. My additional impressions and recommendations are as follows.   Decompensated cirrhosis, hepatitis B: HBcore IgM positive, HBe Ag positive, possible hepatic encephalopathy vs other cause of encephalopathy, grade II nonbleeding esophageal varices and bleeding portal hypertensive gastropathy.  MELD 3.0=15.   Start HBV antiviral after determining availability, insurance coverage. As he is clinically improving we will not plan for transfer at this time. Outpatient follow up with a hepatologist is planned.    Acute on chronic anemia, heme + stool secondary to oozing portal gastropathy. Discontinue octreotide and start Coreg to reduce portal pressures.     Claudette Head, MD [facility] See AMION, [facility], for our on call provider     Subjective / New Events:  Feels okay other than R hip pain. A little sleepy.    Objective:   Recent Labs    06/01/22 1711 06/02/22 0552 06/03/22 0537  WBC 4.4 3.6* 3.7*  HGB 8.3* 8.1* 8.2*  HCT 25.6* 24.5* 25.7*  PLT 49* 50* 53*   BMET Recent Labs    06/01/22 0554 06/02/22 0552 06/03/22 0537  NA 133* 133* 134*  K 3.5 3.8 4.0  CL 102 107 105  CO2 24 22 24   GLUCOSE 156* 125* 118*  BUN 18 15 12   CREATININE 0.96 0.87 0.88  CALCIUM  7.2* 7.1* 7.3*   LFT Recent Labs    06/01/22 0554 06/02/22 0552 06/03/22 0537  PROT 5.7*   < > 5.6*  ALBUMIN 2.1*   < > 1.9*  AST 103*   < > 74*  ALT 48*   < > 39  ALKPHOS 97   < > 101  BILITOT 1.6*   < > 0.9  BILIDIR 0.4*  --   --   IBILI 1.2*  --   --    < > = values in this interval not displayed.   PT/INR Recent Labs    06/01/22 0554 06/02/22 0552  LABPROT 17.2* 17.5*  INR 1.4* 1.4*     Imaging:  CT ANGIO ABD/PELVIS BRTO CLINICAL DATA:  Evaluate anatomy for potential  TIPS procedure.  EXAM: CTA ABDOMEN AND PELVIS WITHOUT AND WITH CONTRAST  TECHNIQUE: Multidetector CT imaging of the abdomen and pelvis was performed using the standard protocol during bolus administration of intravenous contrast. Multiplanar reconstructed images and MIPs were obtained and reviewed to evaluate the vascular anatomy.  RADIATION DOSE REDUCTION: This exam was performed according to the departmental dose-optimization program which includes automated exposure control, adjustment of the mA and/or kV according to patient size and/or use of iterative reconstruction technique.  CONTRAST:  OMNIPAQUE IOHEXOL 350 MG/ML SOLN  COMPARISON:  Abdominal ultrasound 06/01/2022  FINDINGS: VASCULAR  Aorta: Mild atherosclerotic disease in the abdominal aorta without aneurysm, dissection or significant stenosis.  Celiac: Patent without evidence of aneurysm, dissection, vasculitis or significant stenosis.  SMA: Patent without evidence of aneurysm, dissection, vasculitis or significant stenosis.  Renals: Bilateral renal arteries are patent without aneurysm, dissection or significant stenosis. Small accessory left renal artery.  IMA: Patent without evidence of aneurysm, dissection, vasculitis or significant stenosis.  Inflow: Common, internal and external iliac arteries are patent without aneurysm, dissection or significant stenosis. Focal atherosclerotic plaque in the distal right common iliac artery.  Proximal Outflow: Proximal femoral arteries are patent bilaterally.  Portal venous system: Main portal vein is patent. Left and right portal veins are patent. Main portal vein measures approximately 1.5 cm. Splenic vein is patent. SMV is patent. Probable small esophageal varices. No significant gastric varices.  IVC and renal veins: Normal caliber of the IVC. Bilateral renal veins are patent. There is no significant portosystemic shunt associated with the left renal  vein.  Pelvic veins: Bilateral common and external iliac veins are patent.  Review of the MIP images confirms the above findings.  NON-VASCULAR  Lower chest: Trace bilateral pleural effusions. Patchy peripheral reticular densities in the visualized lungs. Some of this could represent atelectasis but some of this may also represent scarring or fibrotic changes. Prior median sternotomy.  Hepatobiliary: Nodular contour of the liver is compatible with cirrhosis. No discrete liver lesion. No gross abnormality to the gallbladder. No biliary dilatation.  Pancreas: Unremarkable. No pancreatic ductal dilatation or surrounding inflammatory changes.  Spleen: Spleen is enlarged measuring 16.7 cm in AP dimension.  Adrenals/Urinary Tract: Normal adrenal glands. There is a punctate calcification in each kidney that probably represents tiny renal stones. No hydronephrosis. No suspicious renal lesion. Normal appearance of the urinary bladder.  Stomach/Bowel: Normal appearance of the stomach. No bowel dilatation. No evidence for bowel obstruction. No focal bowel inflammation.  Lymphatic: No significant lymph node enlargement in the abdomen or pelvis.  Reproductive: Prostate is unremarkable.  Other: Small to moderate amount of ascites in the abdomen and pelvis. Subcutaneous edema. Evidence for hernia repair in the ventral abdomen with a  small amount of mesh material.  Musculoskeletal: Severe joint space loss with subchondral cyst formations in the superior right hip joint. Findings compatible with severe right hip osteoarthritis. Mild levoscoliosis in lumbar spine. Disc space narrowing at L3-L4 and L4-L5.  IMPRESSION: VASCULAR  1. Portal venous system is patent. 2. Small esophageal varices. No significant gastric varices. No significant portosystemic or gastrorenal shunt. 3. Mild atherosclerotic disease in the abdomen and pelvis. Main visceral arteries are  patent.  NON-VASCULAR  1. Cirrhosis with evidence of portal hypertension demonstrated by splenomegaly and ascites. 2. Small bilateral pleural effusions. Peripheral reticular densities in the visualized lungs that could represent chronic changes such as scarring or fibrosis. 3. Tiny bilateral renal calculi.  Electronically Signed   By: Richarda Overlie M.D.   On: 06/02/2022 12:04     Scheduled inpatient medications:   furosemide  20 mg Intravenous BID   insulin aspart  0-5 Units Subcutaneous QHS   insulin aspart  0-9 Units Subcutaneous TID WC   lactulose  20 g Oral BID   pantoprazole (PROTONIX) IV  40 mg Intravenous Q12H   Continuous inpatient infusions:   cefTRIAXone (ROCEPHIN)  IV 2 g (06/02/22 1428)   octreotide (SANDOSTATIN) 500 mcg in sodium chloride 0.9 % 250 mL (2 mcg/mL) infusion 50 mcg/hr (06/03/22 0129)   PRN inpatient medications: lip balm, ondansetron **OR** ondansetron (ZOFRAN) IV, oxyCODONE, phenol  Vital signs in last 24 hours: Temp:  [97.8 F (36.6 C)-98.7 F (37.1 C)] 98.7 F (37.1 C) (05/09 0444) Pulse Rate:  [75-82] 82 (05/09 0444) Resp:  [16-20] 20 (05/09 0444) BP: (121-169)/(73-91) 121/75 (05/09 0444) SpO2:  [96 %-100 %] 100 % (05/09 0444) Last BM Date : 06/01/22  Intake/Output Summary (Last 24 hours) at 06/03/2022 1149 Last data filed at 06/03/2022 1117 Gross per 24 hour  Intake 658 ml  Output 3200 ml  Net -2542 ml    Intake/Output from previous day: 05/08 0701 - 05/09 0700 In: 638 [P.O.:638] Out: 2200 [Urine:2200] Intake/Output this shift: Total I/O In: 220 [P.O.:220] Out: 1300 [Urine:1300]   Physical Exam:  General: Alert male in NAD Heart:  Regular rate and rhythm.  Pulmonary: Normal respiratory effort Abdomen: Soft, obese, nontender. Normal bowel sounds. Extremities: No lower extremity edema  Neurologic: Alert and oriented. No asterixis Psych: Pleasant. Cooperative. Insight appears normal.    Principal Problem:   Symptomatic  anemia Active Problems:   CAD (coronary artery disease)   Obstructive sleep apnea   Morbid obesity due to excess calories (HCC)   Depression with anxiety   Essential hypertension, benign   Hyperlipidemia LDL goal <70   Thrombocytopenia (HCC)   GERD (gastroesophageal reflux disease)   Cirrhosis of liver with ascites (HCC)   OSA (obstructive sleep apnea)   Occult blood in stools     LOS: 4 days   Willette Cluster ,NP 06/03/2022, 11:49 AM

## 2022-06-03 NOTE — Progress Notes (Signed)
PROGRESS NOTE  Barry [name] NFA:[id number] DOB: 07/15/1955 DOA: 05/30/2022 PCP: [name], MD   LOS: 4 days   Brief Narrative / Interim history: 67 year old male with history of liver cirrhosis, prior CVA, CKD 3A, CAD with history of CABG in 2014 comes in from SNF on 5/5 due to altered mental status and a hemoglobin of 4.  Fecal occult was positive, gastroenterology was consulted and he was admitted to the hospital.  Underwent an EGD on 5/7 which showed grade 2 varices without signs of active bleeding there, also showed moderate portal hypertensive gastropathy with active oozing in the antrum.  Plavix and aspirin held.  IR was consulted for TIPS procedure, but given his confusion, no stigmata of recent bleeding felt not to be a good candidate.  Subjective / 24h Interval events: States that he is doing well this morning.  He is a bit confused as to how he ended up in the hospital and does not recall much.  He knows that he is at [facility] but does not know why he is here.  Assesement and Plan: Principal Problem:   Symptomatic anemia Active Problems:   Morbid obesity due to excess calories (HCC)   CAD (coronary artery disease)   Obstructive sleep apnea   Depression with anxiety   Essential hypertension, benign   Hyperlipidemia LDL goal <70   Thrombocytopenia (HCC)   GERD (gastroesophageal reflux disease)   Cirrhosis of liver with ascites (HCC)   OSA (obstructive sleep apnea)   Occult blood in stools   Principal problem Acute blood loss anemia, portal hypertensive gastropathy, esophageal varices, underlying liver cirrhosis -patient was transfused 4 units of packed red blood cells, hemoglobin has improved and is stable.  EGD on 5/7 which showed grade 2 varices without signs of active bleeding there, also showed moderate portal hypertensive gastropathy with active oozing in the antrum.  He has been placed on octreotide, continue for total 72 hours, scheduled to end this afternoon.   Continue ceftriaxone for total of 5 days, today's day #3  Active problems Suspected acute hep B -patient tested positive for hep B antibody for core and surface antigen.  Viral load was elevated 185 million.  This likely represents acute hep B versus chronic acute flare, and in the setting of liver decompensation with GI bleed emergency criteria for treatment per ID.  Further management per GI/ID  AKI -resolved  Liver cirrhosis -management as above  Acute metabolic encephalopathy -CT head on admission without acute findings, continue lactulose.  He has possible memory impairment at baseline.  I have discussed with his daughter and [place], she has noticed some memory issues for quite some time.  Hypovolemic hyponatremia - Stable 134. Continue to monitor.   Elevated troponins - He denies any chest pain, he did relate some shortness of breath but his troponins are trending down there is likely due to drop in hemoglobin likely demand ischemia. Try to keep hemoglobin 8 greater than 8 he has a history of CABG 2014   Thrombocytopenia -due to liver disease   Essential hypertension - Continue to hold antihypertensive medication.   GERD - Continue PPI.   Obstructive sleep apnea - Continue CPAP at night.   Hyperlipidemia  - continue statins.   Morbid obesity - Meets criteria BMI greater than 40  Anasarca -worsening fluid overload, likely in the setting of liver disease.  Continue furosemide  Scheduled Meds:  furosemide  20 mg Intravenous BID   insulin aspart  0-5 Units Subcutaneous  QHS   insulin aspart  0-9 Units Subcutaneous TID WC   lactulose  20 g Oral BID   pantoprazole (PROTONIX) IV  40 mg Intravenous Q12H   Continuous Infusions:  cefTRIAXone (ROCEPHIN)  IV 2 g (06/02/22 1428)   octreotide (SANDOSTATIN) 500 mcg in sodium chloride 0.9 % 250 mL (2 mcg/mL) infusion 50 mcg/hr (06/03/22 0129)   PRN Meds:.lip balm, ondansetron **OR** ondansetron (ZOFRAN) IV, oxyCODONE, phenol  Current  Outpatient Medications  Medication Instructions   acetaminophen (TYLENOL) 500 mg, Oral, Every 8 hours PRN   albuterol (PROVENTIL HFA;VENTOLIN HFA) 108 (90 Base) MCG/ACT inhaler 2 puffs, Inhalation, Every 6 hours PRN   aspirin 81 mg, Oral, Daily   atorvastatin (LIPITOR) 80 mg, Oral, Daily   Blood Glucose Monitoring Suppl (CONTOUR NEXT EZ MONITOR) w/Device KIT Test blood sugar three times daily E11.22    busPIRone (BUSPAR) 15 MG tablet TAKE 1 TABLET BY MOUTH THREE TIMES DAILY FOR ANXIETY   cetirizine (ZYRTEC) 10 mg, Oral, Daily   clopidogrel (PLAVIX) 75 MG tablet APPOINTMENT OVERDUE Take 1 by mouth daily   cyclobenzaprine (FLEXERIL) 10 mg, Oral, Daily at bedtime, May take an additional 10 mg up to twice daily as needed for pain related to RIGHT HIP, DO NOT GIVE WITHIN 6 HOURS OF SCHEDULED DOSE    DULoxetine (CYMBALTA) 20 mg, Oral, Daily   FeroSul 325 mg, Oral, Daily   fluticasone-salmeterol (ADVAIR) 100-50 MCG/ACT AEPB 1 puff, Inhalation, 2 times daily   furosemide (LASIX) 40 mg, Oral, Daily   gabapentin (NEURONTIN) 300 mg, Oral, 2 times daily   lactulose (CHRONULAC) 10 g, Oral, 3 times daily   metFORMIN (GLUCOPHAGE) 1000 MG tablet TAKE 1 TABLET(1000 MG) BY MOUTH TWICE DAILY   metoprolol tartrate (LOPRESSOR) 12.5 mg, Oral, 2 times daily   oxyCODONE-acetaminophen (PERCOCET) 10-325 MG tablet 1 tablet, Oral, Every 8 hours PRN   pantoprazole (PROTONIX) 40 MG tablet TAKE 1 TABLET BY MOUTH EVERY DAY   potassium chloride (KLOR-CON) 10 MEQ tablet 10 mEq, Oral, 2 times daily   sertraline (ZOLOFT) 100 MG tablet TAKE 2 TABLETS(200 MG) BY MOUTH DAILY   thiamine (VITAMIN B-1) 100 mg, Oral, Daily    Diet Orders (From admission, onward)     Start     Ordered   06/01/22 1649  Diet clear liquid Room service appropriate? Yes; Fluid consistency: Thin  Diet effective now       Question Answer Comment  Room service appropriate? Yes   Fluid consistency: Thin      06/01/22 1648            DVT  prophylaxis: SCDs Start: 05/30/22 2051   Lab Results  Component Value Date   PLT 53 (L) 06/03/2022      Code Status: Full Code  Family Communication: daughter over the phone  Status is: Inpatient  Remains inpatient appropriate because: severity of illness  Level of care: Telemetry  Consultants:  GI ID  Objective: Vitals:   06/02/22 0458 06/02/22 1302 06/02/22 1945 06/03/22 0444  BP: 113/77 (!) 147/73 (!) 169/91 121/75  Pulse: 76 80 75 82  Resp: 18 16 20 20   Temp: 98.2 F (36.8 C) 97.8 F (36.6 C) 98.3 F (36.8 C) 98.7 F (37.1 C)  TempSrc:  Oral    SpO2: 95% 96% 97% 100%  Weight:      Height:        Intake/Output Summary (Last 24 hours) at 06/03/2022 1122 Last data filed at 06/03/2022 1000 Gross per 24 hour  Intake 658 ml  Output 2500 ml  Net -1842 ml   Wt Readings from Last 3 Encounters:  06/01/22 113.4 kg  04/28/22 103.6 kg  04/19/22 107.2 kg    Examination:  Constitutional: NAD Eyes: no scleral icterus ENMT: Mucous membranes are moist.  Neck: normal, supple Respiratory: clear to auscultation bilaterally, no wheezing, no crackles. Normal respiratory effort. No accessory muscle use.  Cardiovascular: Regular rate and rhythm, no murmurs / rubs / gallops. 1+ edema Abdomen: non distended, no tenderness. Bowel sounds positive.  Musculoskeletal: no clubbing / cyanosis.   Data Reviewed: I have independently reviewed following labs and imaging studies   CBC Recent Labs  Lab 05/30/22 1730 05/31/22 0706 05/31/22 2105 06/01/22 0554 06/01/22 1711 06/02/22 0552 06/03/22 0537  WBC 6.8 5.1  --  4.5 4.4 3.6* 3.7*  HGB 4.4* 6.4* 9.0* 8.4* 8.3* 8.1* 8.2*  HCT 14.5* 19.8* 27.3* 25.2* 25.6* 24.5* 25.7*  PLT 83* 57*  --  52* 49* 50* 53*  MCV 109.0* 103.7*  --  96.9 99.6 98.8 99.2  MCH 33.1 33.5  --  32.3 32.3 32.7 31.7  MCHC 30.3 32.3  --  33.3 32.4 33.1 31.9  RDW 16.2* 19.2*  --  22.5* 22.4* 22.1* 20.9*  LYMPHSABS 1.9  --   --   --   --  0.9  --   MONOABS  0.9  --   --   --   --  0.4  --   EOSABS 0.2  --   --   --   --  0.2  --   BASOSABS 0.0  --   --   --   --  0.0  --     Recent Labs  Lab 05/30/22 1631 05/30/22 1635 05/30/22 1731 05/31/22 0706 06/01/22 0554 06/02/22 0541 06/02/22 0552 06/03/22 0537  NA  --  133*  --  133* 133*  --  133* 134*  K  --  4.2  --  3.7 3.5  --  3.8 4.0  CL  --  103  --  104 102  --  107 105  CO2  --  22  --  23 24  --  22 24  GLUCOSE  --  178*  --  129* 156*  --  125* 118*  BUN  --  30*  --  29* 18  --  15 12  CREATININE  --  1.25*  --  0.96 0.96  --  0.87 0.88  CALCIUM  --  7.7*  --  7.3* 7.2*  --  7.1* 7.3*  AST  --  117*  --  105* 103*  --  82* 74*  ALT  --  50*  --  48* 48*  --  42 39  ALKPHOS  --  97  --  92 97  --  98 101  BILITOT  --  1.3*  --  1.7* 1.6*  --  1.0 0.9  ALBUMIN  --  2.1*  --  2.0* 2.1*  --  1.9* 1.9*  INR  --   --   --   --  1.4*  --  1.4*  --   AMMONIA 26  --   --   --   --   --  24  --   BNP  --   --  431.8*  --   --  337.8*  --  357.1*    ------------------------------------------------------------------------------------------------------------------ No results for input(s): "CHOL", "HDL", "LDLCALC", "TRIG", "CHOLHDL", "LDLDIRECT" in  the last 72 hours.  Lab Results  Component Value Date   HGBA1C 5.8 (H) 05/06/2022   ------------------------------------------------------------------------------------------------------------------ No results for input(s): "TSH", "T4TOTAL", "T3FREE", "THYROIDAB" in the last 72 hours.  Invalid input(s): "FREET3"  Cardiac Enzymes No results for input(s): "CKMB", "TROPONINI", "MYOGLOBIN" in the last 168 hours.  Invalid input(s): "CK" ------------------------------------------------------------------------------------------------------------------    Component Value Date/Time   BNP 357.1 (H) 06/03/2022 0537    CBG: Recent Labs  Lab 06/02/22 1129 06/02/22 1636 06/02/22 2022 06/03/22 0716 06/03/22 1117  GLUCAP 122* 139* 123*  108* 140*    No results found for this or any previous visit (from the past 240 hour(s)).   Radiology Studies: No results found.   Pamella Pert, MD, PhD [facility]  Between 7 am - 7 pm I am available, please contact me via Amion (for emergencies) or Securechat (non urgent messages)  Between 7 pm - 7 am I am not available, please contact night coverage MD/APP via Amion

## 2022-06-03 NOTE — Progress Notes (Signed)
PT Cancellation Note  Patient Details Name: Barry [name] MRN: [id number] DOB: [birth year]   Cancelled Treatment:     Pt receiving VAS [place] in am. Unable to return later due to scheduling.  Will attempt to see tomorrow.  Felecia Shelling  PTA Acute  Rehabilitation Services Office M-F          [phone]

## 2022-06-03 NOTE — Telephone Encounter (Signed)
Pharmacy Patient Advocate Encounter  Insurance verification completed.    The patient is insured through [facility]   The patient is currently admitted and ran test claims for the following: Vemlidy, Viread.  Copays and coinsurance results were relayed to Inpatient clinical team.

## 2022-06-03 NOTE — TOC Progression Note (Addendum)
Transition of Care [facility]) - Progression Note    Patient Details  Name: Barry [name] MRN: [id number] Date of Birth: 1955-03-13  [facility] of [facility] CM/SW Contact  Otelia Santee, LCSW Phone Number: 06/03/2022, 11:00 AM  Clinical Narrative:    Pt's insurance authorization still pending.   Pt not medically ready. Insurance authorization request has been withdrawn at this time. Will begin authorization process closer to pt being medically ready.   Update 3:45pm- Pt may be medically ready to transfer to SNF tomorrow. [facility] has been requested.   Expected Discharge Plan: Skilled Nursing Facility Barriers to Discharge: Continued Medical Work up  Expected Discharge Plan and Services                                               Social Determinants of Health (SDOH) Interventions SDOH Screenings   Food Insecurity: No Food Insecurity (04/24/2022)  Housing: Low Risk  (04/24/2022)  Transportation Needs: No Transportation Needs (04/24/2022)  Utilities: Not At Risk (04/24/2022)  Depression (PHQ2-9): Low Risk  (05/16/2019)  Tobacco Use: Low Risk  (06/03/2022)    Readmission Risk Interventions    06/03/2022   11:00 AM 06/02/2022    1:36 PM  Readmission Risk Prevention Plan  Transportation Screening Complete Complete  PCP or Specialist Appt within 5-7 Days  Complete  Home Care Screening  Complete  Medication Review (RN CM)  Complete

## 2022-06-04 DIAGNOSIS — D62 Acute posthemorrhagic anemia: Secondary | ICD-10-CM | POA: Diagnosis not present

## 2022-06-04 DIAGNOSIS — R188 Other ascites: Secondary | ICD-10-CM | POA: Diagnosis not present

## 2022-06-04 DIAGNOSIS — K7460 Unspecified cirrhosis of liver: Secondary | ICD-10-CM | POA: Diagnosis not present

## 2022-06-04 DIAGNOSIS — K766 Portal hypertension: Secondary | ICD-10-CM | POA: Diagnosis not present

## 2022-06-04 DIAGNOSIS — D649 Anemia, unspecified: Secondary | ICD-10-CM | POA: Diagnosis not present

## 2022-06-04 LAB — GLUCOSE, CAPILLARY
Glucose-Capillary: 139 mg/dL — ABNORMAL HIGH (ref 70–99)
Glucose-Capillary: 142 mg/dL — ABNORMAL HIGH (ref 70–99)
Glucose-Capillary: 157 mg/dL — ABNORMAL HIGH (ref 70–99)
Glucose-Capillary: 108 mg/dL — ABNORMAL HIGH (ref 70–99)

## 2022-06-04 LAB — COMPREHENSIVE METABOLIC PANEL
ALT: 37 U/L (ref 0–44)
AST: 70 U/L — ABNORMAL HIGH (ref 15–41)
Albumin: 2.1 g/dL — ABNORMAL LOW (ref 3.5–5.0)
Alkaline Phosphatase: 101 U/L (ref 38–126)
Anion gap: 7 (ref 5–15)
BUN: 11 mg/dL (ref 8–23)
CO2: 25 mmol/L (ref 22–32)
Calcium: 7.3 mg/dL — ABNORMAL LOW (ref 8.9–10.3)
Chloride: 102 mmol/L (ref 98–111)
Creatinine, Ser: 0.85 mg/dL (ref 0.61–1.24)
GFR, Estimated: >60 mL/min (ref >60)
Glucose, Bld: 139 mg/dL — ABNORMAL HIGH (ref 70–99)
Potassium: 3.4 mmol/L — ABNORMAL LOW (ref 3.5–5.1)
Sodium: 134 mmol/L — ABNORMAL LOW (ref 135–145)
Total Bilirubin: 1.1 mg/dL (ref 0.3–1.2)
Total Protein: 6.1 g/dL — ABNORMAL LOW (ref 6.5–8.1)

## 2022-06-04 LAB — CBC
HCT: 27.3 % — ABNORMAL LOW (ref 39.0–52.0)
Hemoglobin: 9.1 g/dL — ABNORMAL LOW (ref 13.0–17.0)
MCH: 32.5 pg (ref 26.0–34.0)
MCHC: 33.3 g/dL (ref 30.0–36.0)
MCV: 97.5 fL (ref 80.0–100.0)
Platelets: 60 10*3/uL — ABNORMAL LOW (ref 150–400)
RBC: 2.8 MIL/uL — ABNORMAL LOW (ref 4.22–5.81)
RDW: 20.1 % — ABNORMAL HIGH (ref 11.5–15.5)
WBC: 3.5 10*3/uL — ABNORMAL LOW (ref 4.0–10.5)
nRBC: 0 % (ref 0.0–0.2)

## 2022-06-04 LAB — MAGNESIUM: Magnesium: 1.3 mg/dL — ABNORMAL LOW (ref 1.7–2.4)

## 2022-06-04 LAB — HEPATITIS A ANTIBODY, TOTAL: hep A Total Ab: NONREACTIVE

## 2022-06-04 MED ORDER — TENOFOVIR DISOPROXIL FUMARATE 300 MG PO TABS
300.0000 mg | ORAL_TABLET | Freq: Every day | ORAL | Status: DC
  Administered 2022-06-04 – 2022-06-05 (×2): 300 mg via ORAL
  Filled 2022-06-04 (×2): qty 1

## 2022-06-04 MED ORDER — MAGNESIUM SULFATE 4 GM/100ML IV SOLN
4.0000 g | Freq: Once | INTRAVENOUS | Status: AC
  Administered 2022-06-04: 4 g via INTRAVENOUS
  Filled 2022-06-04 (×2): qty 100

## 2022-06-04 MED ORDER — CARVEDILOL 6.25 MG PO TABS
6.2500 mg | ORAL_TABLET | Freq: Every day | ORAL | Status: DC
  Administered 2022-06-04 – 2022-06-05 (×2): 6.25 mg via ORAL
  Filled 2022-06-04 (×2): qty 1

## 2022-06-04 MED ORDER — POTASSIUM CHLORIDE CRYS ER 20 MEQ PO TBCR
40.0000 meq | EXTENDED_RELEASE_TABLET | Freq: Once | ORAL | Status: AC
  Administered 2022-06-04: 40 meq via ORAL
  Filled 2022-06-04: qty 2

## 2022-06-04 MED ORDER — FUROSEMIDE 40 MG PO TABS
40.0000 mg | ORAL_TABLET | Freq: Every day | ORAL | Status: DC
  Administered 2022-06-05: 40 mg via ORAL
  Filled 2022-06-04: qty 1

## 2022-06-04 NOTE — Progress Notes (Signed)
Physical Therapy Treatment Patient Details Name: Barry [name] MRN: [id number] DOB: September 29, 1955 Today's Date: 06/04/2022   History of Present Illness 67 year old male with history of liver cirrhosis, prior CVA, CKD 3A, CAD with history of CABG in 2014 comes in from SNF on 5/5 due to altered mental status and a hemoglobin of 4.  Fecal occult was positive, gastroenterology was consulted and he was admitted to the hospital.  Underwent an EGD on 5/7 which showed grade 2 varices without signs of active bleeding there, also showed moderate portal hypertensive gastropathy with active oozing in the antrum.  Fall off stretcher while in ED.    PT Comments    General Comments: AxO x 3 pleasant, slight groggy confused to time of day.  Following all directions.  Motivated. Assisted OOB to amb was difficult.  General bed mobility comments: Assist for trunk and R LE. Increased time.  Use of bed rail.  Increased low back and R hip pain with activity.  Pre medicated.  General transfer comment: VC's on proper hand placement and safety with turns. General Gait Details: Limited amb distance 22 feet due to weakness, fatigue and pain as well as EDMA present throughout all extremities.  Mild dyspnea.  Avg HR 109 and RA 97%.  "My legs feel heavy".  Used B Radiographer, therapeutic as a Camera operator following closely behind.  Amb twice after seated rest break.  Unsteady.  HIGH FALL RISK. Pt will need to return to SNF prior to safely returning to "friend" home.   Recommendations for follow up therapy are one component of a multi-disciplinary discharge planning process, led by the attending physician.  Recommendations may be updated based on patient status, additional functional criteria and insurance authorization.  Follow Up Recommendations  Can patient physically be transported by private vehicle: Yes    Assistance Recommended at Discharge Frequent or constant Supervision/Assistance  Patient can return home with the  following A little help with walking and/or transfers;A little help with bathing/dressing/bathroom;Assistance with cooking/housework;Assist for transportation;Help with stairs or ramp for entrance   Equipment Recommendations  None recommended by PT    Recommendations for Other Services       Precautions / Restrictions Precautions Precautions: Fall Restrictions Weight Bearing Restrictions: No     Mobility  Bed Mobility Overal bed mobility: Needs Assistance       Supine to sit: Min assist     General bed mobility comments: Assist for trunk and R LE. Increased time.  Use of bed rail.  Increased low back and R hip pain with activity.  Pre medicated.    Transfers Overall transfer level: Needs assistance   Transfers: Sit to/from Stand Sit to Stand: Min assist, Mod assist           General transfer comment: VC's on proper hand placement and safety with turns.    Ambulation/Gait   Gait Distance (Feet): 44 Feet (22 feet x 2) Assistive device: Bilateral platform walker Gait Pattern/deviations: Step-to pattern, Decreased stance time - right, Drifts right/left, Trunk flexed Gait velocity: decreased     General Gait Details: Limited amb distance 22 feet due to weakness, fatigue and pain as well as EDMA present throughout all extremities.  Mild dyspnea.  Avg HR 109 and RA 97%.  "My legs feel heavy".  Used B Radiographer, therapeutic as a Camera operator following closely behind.  Amb twice after seated rest break.  Unsteady.  HIGH FALL RISK.   Stairs  Wheelchair Mobility    Modified Rankin (Stroke Patients Only)       Balance                                            Cognition Arousal/Alertness: Awake/alert Behavior During Therapy: WFL for tasks assessed/performed Overall Cognitive Status: History of cognitive impairments - at baseline                                 General Comments: AxO x 3 pleasant, slight  groggy confused to time of day.  Following all directions.  Motivated.        Exercises      General Comments        Pertinent Vitals/Pain Pain Assessment Pain Assessment: Faces Faces Pain Scale: Hurts little more Pain Location: L hip "I need a hip replacement" and low back pain Pain Descriptors / Indicators: Discomfort, Grimacing, Sharp Pain Intervention(s): Monitored during session, Premedicated before session, Repositioned    Home Living                          Prior Function            PT Goals (current goals can now be found in the care plan section) Progress towards PT goals: Progressing toward goals    Frequency    Min 1X/week      PT Plan Current plan remains appropriate    Co-evaluation              AM-PAC PT "6 Clicks" Mobility   Outcome Measure  Help needed turning from your back to your side while in a flat bed without using bedrails?: A Lot Help needed moving from lying on your back to sitting on the side of a flat bed without using bedrails?: A Lot Help needed moving to and from a bed to a chair (including a wheelchair)?: A Lot Help needed standing up from a chair using your arms (e.g., wheelchair or bedside chair)?: A Lot Help needed to walk in hospital room?: A Lot Help needed climbing 3-5 steps with a railing? : Total 6 Click Score: 11    End of Session Equipment Utilized During Treatment: Gait belt Activity Tolerance: Patient limited by fatigue;Patient limited by pain Patient left: in chair;with call bell/phone within reach;with chair alarm set Nurse Communication: Mobility status PT Visit Diagnosis: History of falling (Z91.81);Muscle weakness (generalized) (M62.81);Difficulty in walking, not elsewhere classified (R26.2)     Time: 7846-9629 PT Time Calculation (min) (ACUTE ONLY): 18 min  Charges:  $Gait Training: 8-22 mins $Therapeutic Activity: 8-22 mins                     Felecia Shelling  PTA Acute  Rehabilitation  Services Office M-F          [phone]

## 2022-06-04 NOTE — Progress Notes (Signed)
PROGRESS NOTE  Barry [name]:[id number] DOB: 1955/05/18 DOA: 05/30/2022 PCP: [name], MD   LOS: 5 days   Brief Narrative / Interim history: 67 year old male with history of liver cirrhosis, prior CVA, CKD 3A, CAD with history of CABG in 2014 comes in from SNF on 5/5 due to altered mental status and a hemoglobin of 4.  Fecal occult was positive, gastroenterology was consulted and he was admitted to the hospital.  Underwent an EGD on 5/7 which showed grade 2 varices without signs of active bleeding there, also showed moderate portal hypertensive gastropathy with active oozing in the antrum.  Plavix and aspirin held.  IR was consulted for TIPS procedure, but given his confusion, no stigmata of recent bleeding felt not to be a good candidate.  Subjective / 24h Interval events: Doing well today. No complaints.  Assesement and Plan: Principal Problem:   Symptomatic anemia Active Problems:   Morbid obesity due to excess calories (HCC)   CAD (coronary artery disease)   Obstructive sleep apnea   Depression with anxiety   Essential hypertension, benign   Hyperlipidemia LDL goal <70   Thrombocytopenia (HCC)   GERD (gastroesophageal reflux disease)   Cirrhosis of liver with ascites (HCC)   OSA (obstructive sleep apnea)   Occult blood in stools   Portal hypertensive gastropathy (HCC)   Principal problem Acute blood loss anemia, portal hypertensive gastropathy, esophageal varices, underlying liver cirrhosis -patient was transfused 4 units of packed red blood cells, hemoglobin has improved and is stable.  EGD on 5/7 which showed grade 2 varices without signs of active bleeding there, also showed moderate portal hypertensive gastropathy with active oozing in the antrum.  He has completed 72 hours of octreotide.  Continue ceftriaxone for total of 5 days, today's day #4  Active problems Suspected acute hep B -patient tested positive for hep B antibody for core and surface antigen.  Viral load  was elevated 185 million.  This likely represents acute hep B versus chronic acute flare, and in the setting of liver decompensation with GI bleed emergency criteria for treatment per ID.  Further management per GI/ID, now being treated with tenofovir  AKI -resolved  Liver cirrhosis -management as above  Acute metabolic encephalopathy -CT head on admission without acute findings, continue lactulose.  He has possible memory impairment at baseline.  I have discussed with his daughter and Barry [name], she has noticed some memory issues for quite some time.  Hypovolemic hyponatremia - Stable 134. Continue to monitor.   Elevated troponins - He denies any chest pain, he did relate some shortness of breath but his troponins are trending down there is likely due to drop in hemoglobin likely demand ischemia. Try to keep hemoglobin 8 greater than 8 he has a history of CABG 2014   Thrombocytopenia -due to liver disease   Essential hypertension - Continue to hold antihypertensive medication.  Blood pressure soft at times   GERD - Continue PPI.   Obstructive sleep apnea - Continue CPAP at night.   Hyperlipidemia  - continue statins.   Morbid obesity - Meets criteria BMI greater than 40  Anasarca -worsening fluid overload, likely in the setting of liver disease.  Continue furosemide, convert to p.o.  Scheduled Meds:  carvedilol  6.25 mg Oral Q1200   insulin aspart  0-5 Units Subcutaneous QHS   insulin aspart  0-9 Units Subcutaneous TID WC   lactulose  20 g Oral BID   pantoprazole  40 mg Oral BID  tenofovir  300 mg Oral Daily   Continuous Infusions:   PRN Meds:.lip balm, ondansetron **OR** ondansetron (ZOFRAN) IV, oxyCODONE, phenol  Current Outpatient Medications  Medication Instructions   acetaminophen (TYLENOL) 500 mg, Oral, Every 8 hours PRN   albuterol (PROVENTIL HFA;VENTOLIN HFA) 108 (90 Base) MCG/ACT inhaler 2 puffs, Inhalation, Every 6 hours PRN   aspirin 81 mg, Oral, Daily    atorvastatin (LIPITOR) 80 mg, Oral, Daily   Blood Glucose Monitoring Suppl (CONTOUR NEXT EZ MONITOR) w/Device KIT Test blood sugar three times daily E11.22    busPIRone (BUSPAR) 15 MG tablet TAKE 1 TABLET BY MOUTH THREE TIMES DAILY FOR ANXIETY   cetirizine (ZYRTEC) 10 mg, Oral, Daily   clopidogrel (PLAVIX) 75 MG tablet APPOINTMENT OVERDUE Take 1 by mouth daily   cyclobenzaprine (FLEXERIL) 10 mg, Oral, Daily at bedtime, May take an additional 10 mg up to twice daily as needed for pain related to RIGHT HIP, DO NOT GIVE WITHIN 6 HOURS OF SCHEDULED DOSE    DULoxetine (CYMBALTA) 20 mg, Oral, Daily   FeroSul 325 mg, Oral, Daily   fluticasone-salmeterol (ADVAIR) 100-50 MCG/ACT AEPB 1 puff, Inhalation, 2 times daily   furosemide (LASIX) 40 mg, Oral, Daily   gabapentin (NEURONTIN) 300 mg, Oral, 2 times daily   lactulose (CHRONULAC) 10 g, Oral, 3 times daily   metFORMIN (GLUCOPHAGE) 1000 MG tablet TAKE 1 TABLET(1000 MG) BY MOUTH TWICE DAILY   metoprolol tartrate (LOPRESSOR) 12.5 mg, Oral, 2 times daily   oxyCODONE-acetaminophen (PERCOCET) 10-325 MG tablet 1 tablet, Oral, Every 8 hours PRN   pantoprazole (PROTONIX) 40 MG tablet TAKE 1 TABLET BY MOUTH EVERY DAY   potassium chloride (KLOR-CON) 10 MEQ tablet 10 mEq, Oral, 2 times daily   sertraline (ZOLOFT) 100 MG tablet TAKE 2 TABLETS(200 MG) BY MOUTH DAILY   thiamine (VITAMIN B-1) 100 mg, Oral, Daily    Diet Orders (From admission, onward)     Start     Ordered   06/03/22 1212  Diet 2 gram sodium Room service appropriate? Yes; Fluid consistency: Thin  Diet effective now       Question Answer Comment  Room service appropriate? Yes   Fluid consistency: Thin      06/03/22 1211            DVT prophylaxis: SCDs Start: 05/30/22 2051   Lab Results  Component Value Date   PLT 60 (L) 06/04/2022      Code Status: Full Code  Family Communication: daughter over the phone  Status is: Inpatient  Remains inpatient appropriate because:  Awaiting insurance authorization for SNF placement  Level of care: Telemetry  Consultants:  GI ID  Objective: Vitals:   06/03/22 1555 06/03/22 2116 06/04/22 0512 06/04/22 1312  BP: 132/68 101/62 (!) 142/82 129/68  Pulse: 68 70 82 69  Resp:  20 18 17   Temp:  98.5 F (36.9 C) 98 F (36.7 C) 97.6 F (36.4 C)  TempSrc:    Oral  SpO2:  92% 94% 98%  Weight:      Height:        Intake/Output Summary (Last 24 hours) at 06/04/2022 1513 Last data filed at 06/04/2022 1030 Gross per 24 hour  Intake 240 ml  Output 2600 ml  Net -2360 ml    Wt Readings from Last 3 Encounters:  06/01/22 113.4 kg  04/28/22 103.6 kg  04/19/22 107.2 kg    Examination:  Constitutional: NAD Eyes: lids and conjunctivae normal, no scleral icterus ENMT: mmm Neck: normal, supple  Respiratory: clear to auscultation bilaterally, no wheezing, no crackles. Normal respiratory effort.  Cardiovascular: Regular rate and rhythm, no murmurs / rubs / gallops. No LE edema. Abdomen: soft, no distention, no tenderness. Bowel sounds positive.   Data Reviewed: I have independently reviewed following labs and imaging studies   CBC Recent Labs  Lab 05/30/22 1730 05/31/22 0706 06/01/22 0554 06/01/22 1711 06/02/22 0552 06/03/22 0537 06/04/22 0558  WBC 6.8   < > 4.5 4.4 3.6* 3.7* 3.5*  HGB 4.4*   < > 8.4* 8.3* 8.1* 8.2* 9.1*  HCT 14.5*   < > 25.2* 25.6* 24.5* 25.7* 27.3*  PLT 83*   < > 52* 49* 50* 53* 60*  MCV 109.0*   < > 96.9 99.6 98.8 99.2 97.5  MCH 33.1   < > 32.3 32.3 32.7 31.7 32.5  MCHC 30.3   < > 33.3 32.4 33.1 31.9 33.3  RDW 16.2*   < > 22.5* 22.4* 22.1* 20.9* 20.1*  LYMPHSABS 1.9  --   --   --  0.9  --   --   MONOABS 0.9  --   --   --  0.4  --   --   EOSABS 0.2  --   --   --  0.2  --   --   BASOSABS 0.0  --   --   --  0.0  --   --    < > = values in this interval not displayed.     Recent Labs  Lab 05/30/22 1631 05/30/22 1635 05/30/22 1731 05/31/22 0706 06/01/22 0554 06/02/22 0541  06/02/22 0552 06/03/22 0537 06/04/22 0558  NA  --    < >  --  133* 133*  --  133* 134* 134*  K  --    < >  --  3.7 3.5  --  3.8 4.0 3.4*  CL  --    < >  --  104 102  --  107 105 102  CO2  --    < >  --  23 24  --  22 24 25   GLUCOSE  --    < >  --  129* 156*  --  125* 118* 139*  BUN  --    < >  --  29* 18  --  15 12 11   CREATININE  --    < >  --  0.96 0.96  --  0.87 0.88 0.85  CALCIUM  --    < >  --  7.3* 7.2*  --  7.1* 7.3* 7.3*  AST  --    < >  --  105* 103*  --  82* 74* 70*  ALT  --    < >  --  48* 48*  --  42 39 37  ALKPHOS  --    < >  --  92 97  --  98 101 101  BILITOT  --    < >  --  1.7* 1.6*  --  1.0 0.9 1.1  ALBUMIN  --    < >  --  2.0* 2.1*  --  1.9* 1.9* 2.1*  MG  --   --   --   --   --   --   --   --  1.3*  INR  --   --   --   --  1.4*  --  1.4*  --   --   AMMONIA 26  --   --   --   --   --  24  --   --   BNP  --   --  431.8*  --   --  337.8*  --  357.1*  --    < > = values in this interval not displayed.     ------------------------------------------------------------------------------------------------------------------ No results for input(s): "CHOL", "HDL", "LDLCALC", "TRIG", "CHOLHDL", "LDLDIRECT" in the last 72 hours.  Lab Results  Component Value Date   HGBA1C 5.8 (H) 05/06/2022   ------------------------------------------------------------------------------------------------------------------ No results for input(s): "TSH", "T4TOTAL", "T3FREE", "THYROIDAB" in the last 72 hours.  Invalid input(s): "FREET3"  Cardiac Enzymes No results for input(s): "CKMB", "TROPONINI", "MYOGLOBIN" in the last 168 hours.  Invalid input(s): "CK" ------------------------------------------------------------------------------------------------------------------    Component Value Date/Time   BNP 357.1 (H) 06/03/2022 0537    CBG: Recent Labs  Lab 06/03/22 1117 06/03/22 1625 06/03/22 2117 06/04/22 0723 06/04/22 1123  GLUCAP 140* 152* 174* 139* 142*     No results  found for this or any previous visit (from the past 240 hour(s)).   Radiology Studies: No results found.   Pamella Pert, MD, PhD [facility]  Between 7 am - 7 pm I am available, please contact me via Amion (for emergencies) or Securechat (non urgent messages)  Between 7 pm - 7 am I am not available, please contact night coverage MD/APP via Amion

## 2022-06-04 NOTE — Progress Notes (Addendum)
Daily Progress Note  DOA: 05/30/2022 Hospital Day: 6 Chief Complaint: anemia, cirrhosis   Assessment and Plan:    Brief Narrative:  Barry [name] is a 67 y.o. year old male with multiple medical problems not limited to dementia, cirrhosis ( previously followed at outside facility), colon polyps, CVA, CKD 3, anxiety, morbid obesity, OSA on CPAP, CAD s/p remote CABG, DM2. Presented to ED 5/5 with altered mental status, edema, AKI and anemia. We saw him in consult 5/6. Please refer to that note for details   # Decompensated cirrhosis / Acute Hepatitis B . Presented with mental status changes (possibly hepatic encephalopathy) , grade II esophageal varices and bleeding 2/2 portal hypertensive gastropathy. MELD 3.0 is 15.  Hepatic serologic workup for etiology of cirrhosis has been done ( see under labs section). Labs consistent with acute Hep B .  High Viral load 185,000,000.  Hep Be Ag is positive. -HCC screening: 06/01/22 [place] -no reported liver lesions. AFP normal.  -Completed 5 days of empiric Rocephin -IR evaluated for TIPS but didn't recommend one   -Started low dose of Coreg yesterday. Home on Coreg 6.25 daily . Might increase dose once seen in follow up.  -Continue BID Lactulose -Continue BID Pantoprazole x 4 weeks then once daily  -Started Viread 300 mg daily today.  Cost is about 26 dollars a month in outpatient setting.  -Continue 2 gram sodium restricted diet.  -Follow up made with [place] on 7/30. In the interim we will arrange for a referral to [facility] and likely get outpatient labs ( our office will coordinate with SNF) -Hospitalist is discontinuing Plavix. Will continue ASA    # Acute on chronic anemia secondary to oozing gastropathy.   Presenting hgb 4.9, improved after 4 u PRBCs. Hgb stable at 9.1    Attending Physician Note   I have taken an interval history, reviewed the chart and examined the patient. I performed a substantive portion of this encounter, including  complete performance of at least one of the key components, in conjunction with the APP. I agree with the APP's note, impression and recommendations with my edits. My additional impressions and recommendations are as follows.   Decompensated cirrhosis, acute hepatitis B, possible hepatic encephalopathy vs other cause of encephalopathy, grade II nonbleeding esophageal varices and bleeding portal hypertensive gastropathy.  MELD 3.0=15.   Started Viread 300 mg qd. Continue Viread, pantoprazole, lactulose, Coreg as outlined above.     Portal gastropathy with bleeding. Acute on chronic anemia. Continue Coreg 6.25 mg qd to reduce portal pressures. Plavix was discontinued. ASA will continue.   Outpatient follow up [facility] with a Hepatologist. Recommend [facility] in [place]. GI signing off.   Claudette Head, MD [facility] See Loretha Stapler, [facility], for our on call provider     Subjective / New Events:  Feels okay  Objective:   Recent Labs    06/02/22 0552 06/03/22 0537 06/04/22 0558  WBC 3.6* 3.7* 3.5*  HGB 8.1* 8.2* 9.1*  HCT 24.5* 25.7* 27.3*  PLT 50* 53* 60*   BMET Recent Labs    06/02/22 0552 06/03/22 0537 06/04/22 0558  NA 133* 134* 134*  K 3.8 4.0 3.4*  CL 107 105 102  CO2 22 24 25   GLUCOSE 125* 118* 139*  BUN 15 12 11   CREATININE 0.87 0.88 0.85  CALCIUM 7.1* 7.3* 7.3*   LFT Recent Labs    06/04/22 0558  PROT 6.1*  ALBUMIN 2.1*  AST 70*  ALT 37  ALKPHOS 101  BILITOT 1.1   PT/INR Recent Labs    06/02/22 0552  LABPROT 17.5*  INR 1.4*    Scheduled inpatient medications:   carvedilol  6.25 mg Oral Q1200   insulin aspart  0-5 Units Subcutaneous QHS   insulin aspart  0-9 Units Subcutaneous TID WC   lactulose  20 g Oral BID   pantoprazole  40 mg Oral BID   tenofovir  300 mg Oral Daily   Continuous inpatient infusions:   cefTRIAXone (ROCEPHIN)  IV 2 g (06/03/22 1553)   magnesium sulfate bolus IVPB     PRN inpatient medications: lip balm, ondansetron  **OR** ondansetron (ZOFRAN) IV, oxyCODONE, phenol  Vital signs in last 24 hours: Temp:  [97.9 F (36.6 C)-98.5 F (36.9 C)] 98 F (36.7 C) (05/10 0512) Pulse Rate:  [68-82] 82 (05/10 0512) Resp:  [18-20] 18 (05/10 0512) BP: (101-142)/(62-82) 142/82 (05/10 0512) SpO2:  [92 %-95 %] 94 % (05/10 0512) Last BM Date : 06/03/22  Intake/Output Summary (Last 24 hours) at 06/04/2022 0956 Last data filed at 06/04/2022 0500 Gross per 24 hour  Intake 578 ml  Output 3100 ml  Net -2522 ml    Intake/Output from previous day: 05/09 0701 - 05/10 0700 In: 578 [P.O.:578] Out: 3100 [Urine:3100] Intake/Output this shift: No intake/output data recorded.   Physical Exam:  General: Alert obese male in NAD Heart:  Regular rate and rhythm.  Pulmonary: Normal respiratory effort Abdomen: Soft, obese,  nontender. Normal bowel sounds. Extremities: 1+ BLE  edema  Neurologic: Alert and oriented but confused still with some confusion.  Psych: Pleasant. Cooperative.    Principal Problem:   Symptomatic anemia Active Problems:   CAD (coronary artery disease)   Obstructive sleep apnea   Morbid obesity due to excess calories (HCC)   Depression with anxiety   Essential hypertension, benign   Hyperlipidemia LDL goal <70   Thrombocytopenia (HCC)   GERD (gastroesophageal reflux disease)   Cirrhosis of liver with ascites (HCC)   OSA (obstructive sleep apnea)   Occult blood in stools   Portal hypertensive gastropathy (HCC)     LOS: 5 days   Willette Cluster ,NP 06/04/2022, 9:56 AM

## 2022-06-04 NOTE — Care Management Important Message (Signed)
Important Message  Patient Details IM Letter given. Name: Barry [name] MRN: [id number] Date of Birth: October 15, 1955   Medicare Important Message Given:  Yes     Caren Macadam 06/04/2022, 12:42 PM

## 2022-06-04 NOTE — Plan of Care (Signed)
ID Brief note    Patient started on Viread and will be following with [facility] with hepatology fu  HAV ab NR and will need Hep A immunization, can be done OP ID will so, please recall if needed.   Odette Fraction, MD Infectious Disease Physician [facility] [address] [place], [place] [address] Phone: [phone]  Fax: [phone]

## 2022-06-05 DIAGNOSIS — D649 Anemia, unspecified: Secondary | ICD-10-CM | POA: Diagnosis not present

## 2022-06-05 LAB — GLUCOSE, CAPILLARY
Glucose-Capillary: 116 mg/dL — ABNORMAL HIGH (ref 70–99)
Glucose-Capillary: 176 mg/dL — ABNORMAL HIGH (ref 70–99)

## 2022-06-05 LAB — BASIC METABOLIC PANEL
Anion gap: 5 (ref 5–15)
BUN: 10 mg/dL (ref 8–23)
CO2: 25 mmol/L (ref 22–32)
Calcium: 7.2 mg/dL — ABNORMAL LOW (ref 8.9–10.3)
Chloride: 104 mmol/L (ref 98–111)
Creatinine, Ser: 0.74 mg/dL (ref 0.61–1.24)
GFR, Estimated: >60 mL/min (ref >60)
Glucose, Bld: 133 mg/dL — ABNORMAL HIGH (ref 70–99)
Potassium: 3.7 mmol/L (ref 3.5–5.1)
Sodium: 134 mmol/L — ABNORMAL LOW (ref 135–145)

## 2022-06-05 LAB — CBC
HCT: 27.7 % — ABNORMAL LOW (ref 39.0–52.0)
Hemoglobin: 8.5 g/dL — ABNORMAL LOW (ref 13.0–17.0)
MCH: 32 pg (ref 26.0–34.0)
MCHC: 30.7 g/dL (ref 30.0–36.0)
MCV: 104.1 fL — ABNORMAL HIGH (ref 80.0–100.0)
Platelets: 50 10*3/uL — ABNORMAL LOW (ref 150–400)
RBC: 2.66 MIL/uL — ABNORMAL LOW (ref 4.22–5.81)
RDW: 20 % — ABNORMAL HIGH (ref 11.5–15.5)
WBC: 3.1 10*3/uL — ABNORMAL LOW (ref 4.0–10.5)
nRBC: 1.3 % — ABNORMAL HIGH (ref 0.0–0.2)

## 2022-06-05 LAB — MAGNESIUM: Magnesium: 1.6 mg/dL — ABNORMAL LOW (ref 1.7–2.4)

## 2022-06-05 MED ORDER — MAGNESIUM SULFATE 2 GM/50ML IV SOLN
2.0000 g | Freq: Once | INTRAVENOUS | Status: AC
  Administered 2022-06-05: 2 g via INTRAVENOUS
  Filled 2022-06-05: qty 50

## 2022-06-05 MED ORDER — PANTOPRAZOLE SODIUM 40 MG PO TBEC
40.0000 mg | DELAYED_RELEASE_TABLET | Freq: Two times a day (BID) | ORAL | 5 refills | Status: DC
Start: 2022-06-05 — End: 2022-07-04

## 2022-06-05 MED ORDER — CARVEDILOL 6.25 MG PO TABS: 6.2500 mg | ORAL_TABLET | Freq: Every day | ORAL | Status: DC

## 2022-06-05 MED ORDER — TENOFOVIR DISOPROXIL FUMARATE 300 MG PO TABS: 300.0000 mg | ORAL_TABLET | Freq: Every day | ORAL | Status: DC

## 2022-06-05 MED ORDER — OXYCODONE-ACETAMINOPHEN 10-325 MG PO TABS: 1.0000 | ORAL_TABLET | Freq: Three times a day (TID) | ORAL | 0 refills | Status: DC | PRN

## 2022-06-05 NOTE — TOC Transition Note (Signed)
Transition of Care [facility]) - CM/SW Discharge Note   Patient Details  Name: Barry [name] MRN: [id number] Date of Birth: 30-Apr-1955  Transition of Care [facility]) CM/SW Contact:  Amada Jupiter, LCSW Phone Number: 06/05/2022, 1:55 PM   Clinical Narrative:     Have received insurance authorization for pt's return to [facility] and MD has medically cleared for dc today.  Pt and friend, [name], aware and agreeable.  PTAR called at 1350. RN to call report to [phone].  No further TOC needs.  Final next level of care: Skilled Nursing Facility Barriers to Discharge: Barriers Resolved   Patient Goals and CMS Choice      Discharge Placement PASRR number recieved: 05/31/22 PASRR number recieved: 05/31/22            Patient chooses bed at: [facility] Patient to be transferred to facility by: PTAR Name of family member notified: friend, [name] Patient and family notified of of transfer: 06/05/22  Discharge Plan and Services Additional resources added to the After Visit Summary for                  DME Arranged: N/A DME Agency: NA                  Social Determinants of Health (SDOH) Interventions SDOH Screenings   Food Insecurity: No Food Insecurity (04/24/2022)  Housing: Low Risk  (04/24/2022)  Transportation Needs: No Transportation Needs (04/24/2022)  Utilities: Not At Risk (04/24/2022)  Depression (PHQ2-9): Low Risk  (05/16/2019)  Tobacco Use: Low Risk  (06/03/2022)     Readmission Risk Interventions    06/03/2022   11:00 AM 06/02/2022    1:36 PM  Readmission Risk Prevention Plan  Transportation Screening Complete Complete  PCP or Specialist Appt within 5-7 Days  Complete  Home Care Screening  Complete  Medication Review (RN CM)  Complete

## 2022-06-05 NOTE — Discharge Summary (Signed)
Physician Discharge Summary  Barry [name]:[id number] DOB: [birth year] DOA: 05/30/2022  PCP: Barry Grandchild, MD  Admit date: 05/30/2022 Discharge date: 06/05/2022  Admitted From: SNF Disposition:  SNF  Recommendations for Outpatient Follow-up:  Follow up with PCP in 1-2 weeks Please obtain BMP/CBC in one week  Home Health: none Equipment/Devices: none  Discharge Condition: stable CODE STATUS: Full code  HPI: Per admitting MD, Barry [name] is a 67 y.o. male with medical history significant of liver cirrhosis, CVA, GERD, hyperlipidemia, chronic kidney disease stage III, anxiety disorder, morbid obesity, coronary artery disease was brought in from skilled facility with some altered mental status and abnormal labs.  Patient had routine labs that showed very low hemoglobin.  Patient apparently has prior history of liver cirrhosis with varices.  He is unable to give history now as to whether he had melena or bright red blood per rectum.  He is reportedly guaiac positive in the ER.  Patient has hemoglobin of 4.4.  Previous hemoglobin less than a month ago on April 15 was 9.3.  Patient therefore suspected to have GI bleed.  He has had notably AKI with creatinine 1.25.  Also LFTs are more elevated and sodium 133.  He had high-sensitivity troponin first set of 139.  Urinalysis and drug screen were negative.  Ammonia level currently pending.  His platelets 83.  Patient being admitted with suspected GI bleed probably variceal bleed leading to low hemoglobin.  He has symptomatic anemia.   Hospital Course / Discharge diagnoses: Principal Problem:   Symptomatic anemia Active Problems:   Morbid obesity due to excess calories (HCC)   CAD (coronary artery disease)   Obstructive sleep apnea   Depression with anxiety   Essential hypertension, benign   Hyperlipidemia LDL goal <70   Thrombocytopenia (HCC)   GERD (gastroesophageal reflux disease)   Cirrhosis of liver with ascites (HCC)   OSA (obstructive  sleep apnea)   Occult blood in stools   Portal hypertensive gastropathy (HCC)   Principal problem Acute blood loss anemia, portal hypertensive gastropathy, esophageal varices, underlying liver cirrhosis -patient was transfused 4 units of packed red blood cells, hemoglobin has improved and is stable.  EGD on 5/7 which showed grade 2 varices without signs of active bleeding there, also showed moderate portal hypertensive gastropathy with active oozing in the antrum.  He has completed 72 hours of octreotide as well as 5 days of ceftriaxone.  Hemoglobin has remained stable, he has no further evidence of bleeding, will be discharged back to SNF in stable condition   Active problems Acute hep B versus acute reactivation of chronic hep B-patient tested positive for hep B antibody for core and surface antigen.  Viral load was elevated 185 million.  This likely represents acute hep B versus chronic acute flare, and in the setting of liver decompensation with GI bleed emergency criteria for treatment per ID.  Further management per GI/ID, now being treated with tenofovir AKI -resolved Liver cirrhosis -management as above Acute metabolic encephalopathy -CT head on admission without acute findings, continue lactulose.  He has possible memory impairment at baseline.  I have discussed with his daughter and [place], she has noticed some memory issues for quite some time. Hypovolemic hyponatremia - Stable.  Elevated troponins - He denies any chest pain, he did relate some shortness of breath but his troponins are trending down there is likely due to drop in hemoglobin likely demand ischemia.  Thrombocytopenia -due to liver disease Essential hypertension -tolerating Coreg  and furosemide, continue GERD - Continue PPI. Obstructive sleep apnea - Continue CPAP at night. Hyperlipidemia  - continue statins. Morbid obesity - Meets criteria BMI greater than 40 Anasarca -worsening fluid overload, likely in the setting of  liver disease.  Continue furosemide, convert to p.o.  Sepsis ruled out   Discharge Instructions   Allergies as of 06/05/2022   No Known Allergies      Medication List     STOP taking these medications    amoxicillin-clavulanate 875-125 MG tablet Commonly known as: AUGMENTIN   clopidogrel 75 MG tablet Commonly known as: PLAVIX   metoprolol tartrate 25 MG tablet Commonly known as: LOPRESSOR       TAKE these medications    acetaminophen 500 MG tablet Commonly known as: TYLENOL Take 1 tablet (500 mg total) by mouth every 8 (eight) hours as needed for moderate pain.   albuterol 108 (90 Base) MCG/ACT inhaler Commonly known as: VENTOLIN HFA Inhale 2 puffs into the lungs every 6 (six) hours as needed for wheezing or shortness of breath.   aspirin 81 MG chewable tablet Chew 81 mg by mouth daily.   atorvastatin 80 MG tablet Commonly known as: LIPITOR Take 1 tablet (80 mg total) by mouth daily. What changed: when to take this   busPIRone 15 MG tablet Commonly known as: BUSPAR TAKE 1 TABLET BY MOUTH THREE TIMES DAILY FOR ANXIETY What changed:  how much to take how to take this when to take this additional instructions   carvedilol 6.25 MG tablet Commonly known as: COREG Take 1 tablet (6.25 mg total) by mouth daily at 12 noon. Start taking on: Jun 06, 2022   cetirizine 10 MG tablet Commonly known as: ZYRTEC Take 10 mg by mouth daily.   CONTOUR NEXT EZ MONITOR w/Device Kit Test blood sugar three times daily E11.22   cyclobenzaprine 10 MG tablet Commonly known as: FLEXERIL Take 10 mg by mouth at bedtime. May take an additional 10 mg up to twice daily as needed for pain related to RIGHT HIP, DO NOT GIVE WITHIN 6 HOURS OF SCHEDULED DOSE   DULoxetine 20 MG capsule Commonly known as: CYMBALTA Take 20 mg by mouth daily.   FeroSul 325 (65 FE) MG tablet Generic drug: ferrous sulfate Take 325 mg by mouth daily.   fluticasone-salmeterol 100-50 MCG/ACT  Aepb Commonly known as: ADVAIR Inhale 1 puff into the lungs 2 (two) times daily.   furosemide 40 MG tablet Commonly known as: LASIX Take 40 mg by mouth daily.   gabapentin 300 MG capsule Commonly known as: NEURONTIN Take 300 mg by mouth in the morning and at bedtime.   lactulose 10 GM/15ML solution Commonly known as: CHRONULAC Take 15 mLs (10 g total) by mouth 3 (three) times daily.   metFORMIN 1000 MG tablet Commonly known as: GLUCOPHAGE TAKE 1 TABLET(1000 MG) BY MOUTH TWICE DAILY What changed: See the new instructions.   oxyCODONE-acetaminophen 10-325 MG tablet Commonly known as: PERCOCET Take 1 tablet by mouth every 8 (eight) hours as needed for pain. What changed:  when to take this reasons to take this additional instructions   pantoprazole 40 MG tablet Commonly known as: PROTONIX Take 1 tablet (40 mg total) by mouth 2 (two) times daily before a meal. TAKE 1 TABLET BY MOUTH EVERY DAY What changed:  how much to take how to take this when to take this   potassium chloride 10 MEQ tablet Commonly known as: KLOR-CON Take 10 mEq by mouth 2 (two) times daily.  sertraline 100 MG tablet Commonly known as: ZOLOFT TAKE 2 TABLETS(200 MG) BY MOUTH DAILY What changed: See the new instructions.   tenofovir 300 MG tablet Commonly known as: VIREAD Take 1 tablet (300 mg total) by mouth daily. Start taking on: Jun 06, 2022   thiamine 100 MG tablet Commonly known as: Vitamin B-1 Take 1 tablet (100 mg total) by mouth daily.        Follow-up Information     Meryl Dare, MD Follow up on 08/24/2022.   Specialty: Gastroenterology Why: At 10:30 am. Please arrive 10 minutes earlier. Contact information: [address] [place] [address] [phone]                 Consultations: GI  Procedures/Studies:  VAS [place] UPPER EXTREMITY VENOUS DUPLEX  Result Date: 06/03/2022 UPPER VENOUS STUDY  Patient Name:  JOHM ADAMIAN Marteney  Date of Exam:   06/03/2022 Medical  Rec #: [id number]   Accession #:    [id number] Date of Birth: 02-May-1955    Patient Gender: M Patient Age:   49 years Exam Location:  [facility] Procedure:      VAS [place] UPPER EXTREMITY VENOUS DUPLEX Referring Phys: Pamella Pert --------------------------------------------------------------------------------  Indications: Swelling Limitations: Inability to cooperate (movement), line and poor ultrasound/tissue interface. Comparison Study: No previous exams Performing Technologist: Jody Hill RVT, RDMS  Examination Guidelines: A complete evaluation includes B-mode imaging, spectral Doppler, color Doppler, and power Doppler as needed of all accessible portions of each vessel. Bilateral testing is considered an integral part of a complete examination. Limited examinations for reoccurring indications may be performed as noted.  Right Findings: +----------+------------+---------+-----------+----------+--------------+ RIGHT     CompressiblePhasicitySpontaneousProperties   Summary     +----------+------------+---------+-----------+----------+--------------+ IJV           Full       No        Yes    pulsatile                +----------+------------+---------+-----------+----------+--------------+ Subclavian    Full       Yes       Yes                             +----------+------------+---------+-----------+----------+--------------+ Axillary      Full       Yes       Yes                             +----------+------------+---------+-----------+----------+--------------+ Brachial      Full       Yes       Yes                             +----------+------------+---------+-----------+----------+--------------+ Radial        Full                                                 +----------+------------+---------+-----------+----------+--------------+ Ulnar         Full                                                  +----------+------------+---------+-----------+----------+--------------+  Cephalic                                            Not visualized +----------+------------+---------+-----------+----------+--------------+ Basilic       Full       Yes       Yes                             +----------+------------+---------+-----------+----------+--------------+  Summary:  Right: No evidence of deep vein thrombosis in the upper extremity. Findings consistent with acute superficial vein thrombosis involving vein in forearm (branch of basilic vein).  Left: No evidence of thrombosis in the subclavian.  *See table(s) above for measurements and observations.    Preliminary    CT ANGIO ABD/PELVIS BRTO  Result Date: 06/02/2022 CLINICAL DATA:  Evaluate anatomy for potential TIPS procedure. EXAM: CTA ABDOMEN AND PELVIS WITHOUT AND WITH CONTRAST TECHNIQUE: Multidetector CT imaging of the abdomen and pelvis was performed using the standard protocol during bolus administration of intravenous contrast. Multiplanar reconstructed images and MIPs were obtained and reviewed to evaluate the vascular anatomy. RADIATION DOSE REDUCTION: This exam was performed according to the departmental dose-optimization program which includes automated exposure control, adjustment of the mA and/or kV according to patient size and/or use of iterative reconstruction technique. CONTRAST:  OMNIPAQUE IOHEXOL 350 MG/ML SOLN COMPARISON:  Abdominal ultrasound 06/01/2022 FINDINGS: VASCULAR Aorta: Mild atherosclerotic disease in the abdominal aorta without aneurysm, dissection or significant stenosis. Celiac: Patent without evidence of aneurysm, dissection, vasculitis or significant stenosis. SMA: Patent without evidence of aneurysm, dissection, vasculitis or significant stenosis. Renals: Bilateral renal arteries are patent without aneurysm, dissection or significant stenosis. Small accessory left renal artery. IMA: Patent without evidence of  aneurysm, dissection, vasculitis or significant stenosis. Inflow: Common, internal and external iliac arteries are patent without aneurysm, dissection or significant stenosis. Focal atherosclerotic plaque in the distal right common iliac artery. Proximal Outflow: Proximal femoral arteries are patent bilaterally. Portal venous system: Main portal vein is patent. Left and right portal veins are patent. Main portal vein measures approximately 1.5 cm. Splenic vein is patent. SMV is patent. Probable small esophageal varices. No significant gastric varices. IVC and renal veins: Normal caliber of the IVC. Bilateral renal veins are patent. There is no significant portosystemic shunt associated with the left renal vein. Pelvic veins: Bilateral common and external iliac veins are patent. Review of the MIP images confirms the above findings. NON-VASCULAR Lower chest: Trace bilateral pleural effusions. Patchy peripheral reticular densities in the visualized lungs. Some of this could represent atelectasis but some of this may also represent scarring or fibrotic changes. Prior median sternotomy. Hepatobiliary: Nodular contour of the liver is compatible with cirrhosis. No discrete liver lesion. No gross abnormality to the gallbladder. No biliary dilatation. Pancreas: Unremarkable. No pancreatic ductal dilatation or surrounding inflammatory changes. Spleen: Spleen is enlarged measuring 16.7 cm in AP dimension. Adrenals/Urinary Tract: Normal adrenal glands. There is a punctate calcification in each kidney that probably represents tiny renal stones. No hydronephrosis. No suspicious renal lesion. Normal appearance of the urinary bladder. Stomach/Bowel: Normal appearance of the stomach. No bowel dilatation. No evidence for bowel obstruction. No focal bowel inflammation. Lymphatic: No significant lymph node enlargement in the abdomen or pelvis. Reproductive: Prostate is unremarkable. Other: Small to moderate amount of ascites in the  abdomen and pelvis. Subcutaneous edema. Evidence for  hernia repair in the ventral abdomen with a small amount of mesh material. Musculoskeletal: Severe joint space loss with subchondral cyst formations in the superior right hip joint. Findings compatible with severe right hip osteoarthritis. Mild levoscoliosis in lumbar spine. Disc space narrowing at L3-L4 and L4-L5. IMPRESSION: VASCULAR 1. Portal venous system is patent. 2. Small esophageal varices. No significant gastric varices. No significant portosystemic or gastrorenal shunt. 3. Mild atherosclerotic disease in the abdomen and pelvis. Main visceral arteries are patent. NON-VASCULAR 1. Cirrhosis with evidence of portal hypertension demonstrated by splenomegaly and ascites. 2. Small bilateral pleural effusions. Peripheral reticular densities in the visualized lungs that could represent chronic changes such as scarring or fibrosis. 3. Tiny bilateral renal calculi. Electronically Signed   By: Richarda Overlie M.D.   On: 06/02/2022 12:04   US Abdomen Limited RUQ (LIVER/GB)  Result Date: 06/01/2022 CLINICAL DATA:  [id number] Cirrhosis (HCC) [id number] EXAM: ULTRASOUND ABDOMEN LIMITED RIGHT UPPER QUADRANT COMPARISON:  07/21/2018 FINDINGS: Gallbladder: No gallstones visualized. Mild nonspecific wall thickening measuring 0.8 mm. No Murphy's sign elicited. No pericholecystic fluid. Common bile duct: Diameter: 3.6 mm Liver: There is coarse heterogeneous echotexture with surface nodularity compatible with cirrhosis. No large focal hepatic abnormality or biliary obstruction pattern. Portal vein is patent on color Doppler imaging with normal direction of blood flow towards the liver. Other: Surrounding perihepatic ascites noted. IMPRESSION: 1. Hepatic cirrhosis 2. Perihepatic ascites. 3. Nonspecific gallbladder wall thickening. No gallstones or Murphy's sign. Electronically Signed   By: Judie Petit.  Shick M.D.   On: 06/01/2022 09:38   CT HEAD WO CONTRAST ( )  Result Date: 05/30/2022 CLINICAL  DATA:  Anemia, altered level of consciousness EXAM: CT HEAD WITHOUT CONTRAST TECHNIQUE: Contiguous axial images were obtained from the base of the skull through the vertex without intravenous contrast. RADIATION DOSE REDUCTION: This exam was performed according to the departmental dose-optimization program which includes automated exposure control, adjustment of the mA and/or kV according to patient size and/or use of iterative reconstruction technique. COMPARISON:  05/10/2022 FINDINGS: Brain: No acute infarct or hemorrhage. Lateral ventricles and midline structures are stable. No acute extra-axial fluid collections. No mass effect. Vascular: Stable atherosclerosis.  No hyperdense vessel. Skull: Normal. Negative for fracture or focal lesion. Sinuses/Orbits: Right ocular prosthesis. Mucosal thickening within the ethmoid and frontal sinuses. Other: None. IMPRESSION: 1. No acute intracranial process. Electronically Signed   By: Sharlet Salina M.D.   On: 05/30/2022 21:20   DG Chest Port 1 View  Result Date: 05/30/2022 CLINICAL DATA:  Shortness of breath.  Altered mental status. EXAM: PORTABLE CHEST 1 VIEW COMPARISON:  05/10/2022 FINDINGS: Previous median sternotomy and CABG. Pulmonary venous hypertension. Possible mild interstitial edema, small effusions and volume loss in the lower lungs. IMPRESSION: Suspicion of fluid overload/mild congestive heart failure. Electronically Signed   By: Paulina Fusi M.D.   On: 05/30/2022 16:48   CT Head Wo Contrast  Result Date: 05/10/2022 CLINICAL DATA:  Mental status change of unknown cause. EXAM: CT HEAD WITHOUT CONTRAST TECHNIQUE: Contiguous axial images were obtained from the base of the skull through the vertex without intravenous contrast. RADIATION DOSE REDUCTION: This exam was performed according to the departmental dose-optimization program which includes automated exposure control, adjustment of the mA and/or kV according to patient size and/or use of iterative  reconstruction technique. COMPARISON:  04/18/2022 FINDINGS: Brain: No evidence of acute infarction, hemorrhage, hydrocephalus, extra-axial collection or mass lesion/mass effect. Vascular: No hyperdense vessel or unexpected calcification. Skull: Normal. Negative for fracture or focal lesion. Sinuses/Orbits: Stable  prosthetic right globe. Left globe and orbit are unremarkable. Significant sinus disease. Moderate mucosal thickening with dependent fluid noted in the maxillary sinuses. Moderate to marked mucosal thickening noted throughout the ethmoid air cells, many of which are opacified. Small sphenoid sinuses with mucosal thickening, left with dependent fluid. Dependent fluid in the left frontal sinus. Other: None. IMPRESSION: 1. No acute intracranial abnormalities. 2. Significant sinus disease as detailed including fluid levels. Consider acute sinusitis in the proper clinical setting. Electronically Signed   By: Amie Portland M.D.   On: 05/10/2022 15:10   DG Chest Port 1 View  Result Date: 05/10/2022 CLINICAL DATA:  Altered mental status EXAM: PORTABLE CHEST - 1 VIEW COMPARISON:  04/18/2022 FINDINGS: Patchy interstitial opacities in the lung bases slightly improved since previous. Heart size upper limits normal. CABG markers. Aortic Atherosclerosis (ICD10-170.0). Chronic blunting of left lateral costophrenic angle. Sternotomy wires. IMPRESSION: Slight improvement in bibasilar interstitial opacities. Electronically Signed   By: Corlis Leak M.D.   On: 05/10/2022 14:37     Subjective: - no chest pain, shortness of breath, no abdominal pain, nausea or vomiting.   Discharge Exam: BP 135/78 (BP Location: Left Arm)   Pulse 77   Temp 97.9 F (36.6 C) (Oral)   Resp 18   Ht 5\' 4"  (1.626 m)   Wt 113.4 kg   SpO2 98%   BMI 42.91 kg/m   General: Pt is alert, awake, not in acute distress Cardiovascular: RRR, S1/S2 +, no rubs, no gallops Respiratory: CTA bilaterally, no wheezing, no rhonchi Abdominal: Soft,  NT, ND, bowel sounds + Extremities: no edema, no cyanosis  The results of significant diagnostics from this hospitalization (including imaging, microbiology, ancillary and laboratory) are listed below for reference.     Microbiology: No results found for this or any previous visit (from the past 240 hour(s)).   Labs: Basic Metabolic Panel: Recent Labs  Lab 06/01/22 0554 06/02/22 0552 06/03/22 0537 06/04/22 0558 06/05/22 0545  NA 133* 133* 134* 134* 134*  K 3.5 3.8 4.0 3.4* 3.7  CL 102 107 105 102 104  CO2 24 22 24 25 25   GLUCOSE 156* 125* 118* 139* 133*  BUN 18 15 12 11 10   CREATININE 0.96 0.87 0.88 0.85 0.74  CALCIUM 7.2* 7.1* 7.3* 7.3* 7.2*  MG  --   --   --  1.3* 1.6*   Liver Function Tests: Recent Labs  Lab 05/31/22 0706 06/01/22 0554 06/02/22 0552 06/03/22 0537 06/04/22 0558  AST 105* 103* 82* 74* 70*  ALT 48* 48* 42 39 37  ALKPHOS 92 97 98 101 101  BILITOT 1.7* 1.6* 1.0 0.9 1.1  PROT 5.4* 5.7* 5.4* 5.6* 6.1*  ALBUMIN 2.0* 2.1* 1.9* 1.9* 2.1*   CBC: Recent Labs  Lab 05/30/22 1730 05/31/22 0706 06/01/22 1711 06/02/22 0552 06/03/22 0537 06/04/22 0558 06/05/22 0545  WBC 6.8   < > 4.4 3.6* 3.7* 3.5* 3.1*  NEUTROABS 3.7  --   --  2.1  --   --   --   HGB 4.4*   < > 8.3* 8.1* 8.2* 9.1* 8.5*  HCT 14.5*   < > 25.6* 24.5* 25.7* 27.3* 27.7*  MCV 109.0*   < > 99.6 98.8 99.2 97.5 104.1*  PLT 83*   < > 49* 50* 53* 60* 50*   < > = values in this interval not displayed.   CBG: Recent Labs  Lab 06/04/22 1123 06/04/22 1621 06/04/22 2209 06/05/22 0807 06/05/22 1212  GLUCAP 142* 157* 108* 116*  176*   Hgb A1c No results for input(s): "HGBA1C" in the last 72 hours. Lipid Profile No results for input(s): "CHOL", "HDL", "LDLCALC", "TRIG", "CHOLHDL", "LDLDIRECT" in the last 72 hours. Thyroid function studies No results for input(s): "TSH", "T4TOTAL", "T3FREE", "THYROIDAB" in the last 72 hours.  Invalid input(s): "FREET3" Urinalysis    Component Value  Date/Time   COLORURINE YELLOW 05/10/2022 1558   APPEARANCEUR CLEAR 05/10/2022 1558   APPEARANCEUR Clear 07/16/2014 0804   LABSPEC 1.023 05/10/2022 1558   PHURINE 5.0 05/10/2022 1558   GLUCOSEU NEGATIVE 05/10/2022 1558   GLUCOSEU NEGATIVE 03/01/2022 1400   HGBUR NEGATIVE 05/10/2022 1558   BILIRUBINUR NEGATIVE 05/10/2022 1558   BILIRUBINUR Negative 07/16/2014 0804   KETONESUR 80 (A) 05/10/2022 1558   PROTEINUR NEGATIVE 05/10/2022 1558   UROBILINOGEN 2.0 (A) 03/01/2022 1400   NITRITE NEGATIVE 05/10/2022 1558   LEUKOCYTESUR TRACE (A) 05/10/2022 1558    FURTHER DISCHARGE INSTRUCTIONS:   Get Medicines reviewed and adjusted: Please take all your medications with you for your next visit with your Primary MD   Laboratory/radiological data: Please request your Primary MD to go over all hospital tests and procedure/radiological results at the follow up, please ask your Primary MD to get all Hospital records sent to his/her office.   In some cases, they will be blood work, cultures and biopsy results pending at the time of your discharge. Please request that your primary care M.D. goes through all the records of your hospital data and follows up on these results.   Also Note the following: If you experience worsening of your admission symptoms, develop shortness of breath, life threatening emergency, suicidal or homicidal thoughts you must seek medical attention immediately by calling 911 or calling your MD immediately  if symptoms less severe.   You must read complete instructions/literature along with all the possible adverse reactions/side effects for all the Medicines you take and that have been prescribed to you. Take any new Medicines after you have completely understood and accpet all the possible adverse reactions/side effects.    Do not drive when taking Pain medications or sleeping medications (Benzodaizepines)   Do not take more than prescribed Pain, Sleep and Anxiety Medications.  It is not advisable to combine anxiety,sleep and pain medications without talking with your primary care practitioner   Special Instructions: If you have smoked or chewed Tobacco  in the last 2 yrs please stop smoking, stop any regular Alcohol  and or any Recreational drug use.   Wear Seat belts while driving.   Please note: You were cared for by a hospitalist during your hospital stay. Once you are discharged, your primary care physician will handle any further medical issues. Please note that NO REFILLS for any discharge medications will be authorized once you are discharged, as it is imperative that you return to your primary care physician (or establish a relationship with a primary care physician if you do not have one) for your post hospital discharge needs so that they can reassess your need for medications and monitor your lab values.  Time coordinating discharge: 35 minutes  SIGNED:  Pamella Pert, MD, PhD 06/05/2022, 1:22 PM

## 2022-06-09 ENCOUNTER — Telehealth: Payer: Self-pay | Admitting: Radiology

## 2022-06-24 ENCOUNTER — Telehealth: Payer: Self-pay

## 2022-06-24 NOTE — Telephone Encounter (Signed)
Patient has transferred to [facility]  [address], [place], [place] [address] Phone: [phone]  Fax [phone]  Spoke with nurse. Okay to fax the order for the labs. Include fax number for results to be sent to. I included my direct line for any questions.

## 2022-06-24 NOTE — Telephone Encounter (Signed)
-----   Message from Loretha Stapler, RN sent at 06/16/2022 11:22 AM EDT -----  ----- Message ----- From: Meredith Pel, NP Sent: 06/07/2022  10:40 AM EDT To: Evalee Jefferson, LPN  Hi Beth,  This patient needs a CMP , CBC and INR in 2 weeks. Diagnosis is acute Hepatitis B infection and cirrhosis. Can you ask the SNF to get these labs and fax results to me? Thanks so much. Oh, he went to [facility]. Thanks

## 2022-06-26 ENCOUNTER — Emergency Department: Payer: Medicare HMO

## 2022-06-26 ENCOUNTER — Inpatient Hospital Stay
Admission: EM | Admit: 2022-06-26 | Discharge: 2022-07-26 | DRG: 870 | Disposition: E | Payer: Medicare HMO | Source: Skilled Nursing Facility | Attending: Student in an Organized Health Care Education/Training Program | Admitting: Student in an Organized Health Care Education/Training Program

## 2022-06-26 ENCOUNTER — Other Ambulatory Visit: Payer: Self-pay

## 2022-06-26 DIAGNOSIS — R188 Other ascites: Secondary | ICD-10-CM | POA: Diagnosis present

## 2022-06-26 DIAGNOSIS — Z79899 Other long term (current) drug therapy: Secondary | ICD-10-CM

## 2022-06-26 DIAGNOSIS — I5043 Acute on chronic combined systolic (congestive) and diastolic (congestive) heart failure: Secondary | ICD-10-CM | POA: Diagnosis not present

## 2022-06-26 DIAGNOSIS — I10 Essential (primary) hypertension: Secondary | ICD-10-CM | POA: Diagnosis present

## 2022-06-26 DIAGNOSIS — I679 Cerebrovascular disease, unspecified: Secondary | ICD-10-CM | POA: Diagnosis present

## 2022-06-26 DIAGNOSIS — Z794 Long term (current) use of insulin: Secondary | ICD-10-CM | POA: Diagnosis not present

## 2022-06-26 DIAGNOSIS — K766 Portal hypertension: Secondary | ICD-10-CM | POA: Diagnosis present

## 2022-06-26 DIAGNOSIS — J9601 Acute respiratory failure with hypoxia: Secondary | ICD-10-CM | POA: Diagnosis not present

## 2022-06-26 DIAGNOSIS — E1140 Type 2 diabetes mellitus with diabetic neuropathy, unspecified: Secondary | ICD-10-CM | POA: Diagnosis not present

## 2022-06-26 DIAGNOSIS — Z7951 Long term (current) use of inhaled steroids: Secondary | ICD-10-CM

## 2022-06-26 DIAGNOSIS — Z6841 Body Mass Index (BMI) 40.0 and over, adult: Secondary | ICD-10-CM

## 2022-06-26 DIAGNOSIS — I130 Hypertensive heart and chronic kidney disease with heart failure and stage 1 through stage 4 chronic kidney disease, or unspecified chronic kidney disease: Secondary | ICD-10-CM | POA: Diagnosis present

## 2022-06-26 DIAGNOSIS — B169 Acute hepatitis B without delta-agent and without hepatic coma: Secondary | ICD-10-CM | POA: Diagnosis present

## 2022-06-26 DIAGNOSIS — K7031 Alcoholic cirrhosis of liver with ascites: Secondary | ICD-10-CM | POA: Diagnosis not present

## 2022-06-26 DIAGNOSIS — K3189 Other diseases of stomach and duodenum: Secondary | ICD-10-CM | POA: Diagnosis present

## 2022-06-26 DIAGNOSIS — Z823 Family history of stroke: Secondary | ICD-10-CM

## 2022-06-26 DIAGNOSIS — Z1152 Encounter for screening for COVID-19: Secondary | ICD-10-CM | POA: Diagnosis not present

## 2022-06-26 DIAGNOSIS — I252 Old myocardial infarction: Secondary | ICD-10-CM

## 2022-06-26 DIAGNOSIS — Z66 Do not resuscitate: Secondary | ICD-10-CM | POA: Diagnosis not present

## 2022-06-26 DIAGNOSIS — I2510 Atherosclerotic heart disease of native coronary artery without angina pectoris: Secondary | ICD-10-CM | POA: Diagnosis present

## 2022-06-26 DIAGNOSIS — K7460 Unspecified cirrhosis of liver: Secondary | ICD-10-CM | POA: Diagnosis not present

## 2022-06-26 DIAGNOSIS — N179 Acute kidney failure, unspecified: Secondary | ICD-10-CM | POA: Diagnosis not present

## 2022-06-26 DIAGNOSIS — K652 Spontaneous bacterial peritonitis: Secondary | ICD-10-CM | POA: Diagnosis present

## 2022-06-26 DIAGNOSIS — A419 Sepsis, unspecified organism: Secondary | ICD-10-CM | POA: Diagnosis present

## 2022-06-26 DIAGNOSIS — R4182 Altered mental status, unspecified: Secondary | ICD-10-CM

## 2022-06-26 DIAGNOSIS — R0609 Other forms of dyspnea: Secondary | ICD-10-CM | POA: Diagnosis not present

## 2022-06-26 DIAGNOSIS — I5031 Acute diastolic (congestive) heart failure: Secondary | ICD-10-CM | POA: Diagnosis not present

## 2022-06-26 DIAGNOSIS — N170 Acute kidney failure with tubular necrosis: Secondary | ICD-10-CM | POA: Diagnosis present

## 2022-06-26 DIAGNOSIS — G9341 Metabolic encephalopathy: Secondary | ICD-10-CM | POA: Diagnosis present

## 2022-06-26 DIAGNOSIS — R6520 Severe sepsis without septic shock: Secondary | ICD-10-CM | POA: Diagnosis not present

## 2022-06-26 DIAGNOSIS — I8511 Secondary esophageal varices with bleeding: Secondary | ICD-10-CM | POA: Diagnosis present

## 2022-06-26 DIAGNOSIS — Z800 Family history of malignant neoplasm of digestive organs: Secondary | ICD-10-CM

## 2022-06-26 DIAGNOSIS — I25118 Atherosclerotic heart disease of native coronary artery with other forms of angina pectoris: Secondary | ICD-10-CM | POA: Diagnosis not present

## 2022-06-26 DIAGNOSIS — E785 Hyperlipidemia, unspecified: Secondary | ICD-10-CM | POA: Diagnosis present

## 2022-06-26 DIAGNOSIS — I2489 Other forms of acute ischemic heart disease: Secondary | ICD-10-CM | POA: Diagnosis not present

## 2022-06-26 DIAGNOSIS — Z8249 Family history of ischemic heart disease and other diseases of the circulatory system: Secondary | ICD-10-CM

## 2022-06-26 DIAGNOSIS — K767 Hepatorenal syndrome: Secondary | ICD-10-CM | POA: Diagnosis present

## 2022-06-26 DIAGNOSIS — F418 Other specified anxiety disorders: Secondary | ICD-10-CM | POA: Diagnosis present

## 2022-06-26 DIAGNOSIS — D62 Acute posthemorrhagic anemia: Secondary | ICD-10-CM | POA: Diagnosis present

## 2022-06-26 DIAGNOSIS — F32A Depression, unspecified: Secondary | ICD-10-CM | POA: Diagnosis present

## 2022-06-26 DIAGNOSIS — K922 Gastrointestinal hemorrhage, unspecified: Secondary | ICD-10-CM | POA: Diagnosis not present

## 2022-06-26 DIAGNOSIS — R6521 Severe sepsis with septic shock: Secondary | ICD-10-CM | POA: Diagnosis not present

## 2022-06-26 DIAGNOSIS — Z515 Encounter for palliative care: Secondary | ICD-10-CM | POA: Diagnosis not present

## 2022-06-26 DIAGNOSIS — D696 Thrombocytopenia, unspecified: Secondary | ICD-10-CM | POA: Diagnosis not present

## 2022-06-26 DIAGNOSIS — I071 Rheumatic tricuspid insufficiency: Secondary | ICD-10-CM | POA: Diagnosis present

## 2022-06-26 DIAGNOSIS — J45909 Unspecified asthma, uncomplicated: Secondary | ICD-10-CM | POA: Diagnosis present

## 2022-06-26 DIAGNOSIS — E1122 Type 2 diabetes mellitus with diabetic chronic kidney disease: Secondary | ICD-10-CM | POA: Diagnosis present

## 2022-06-26 DIAGNOSIS — Z7984 Long term (current) use of oral hypoglycemic drugs: Secondary | ICD-10-CM

## 2022-06-26 DIAGNOSIS — Z8673 Personal history of transient ischemic attack (TIA), and cerebral infarction without residual deficits: Secondary | ICD-10-CM

## 2022-06-26 DIAGNOSIS — G4733 Obstructive sleep apnea (adult) (pediatric): Secondary | ICD-10-CM | POA: Diagnosis present

## 2022-06-26 DIAGNOSIS — J810 Acute pulmonary edema: Secondary | ICD-10-CM | POA: Diagnosis not present

## 2022-06-26 DIAGNOSIS — Z789 Other specified health status: Secondary | ICD-10-CM | POA: Insufficient documentation

## 2022-06-26 DIAGNOSIS — D863 Sarcoidosis of skin: Secondary | ICD-10-CM | POA: Diagnosis present

## 2022-06-26 DIAGNOSIS — K7682 Hepatic encephalopathy: Secondary | ICD-10-CM | POA: Diagnosis not present

## 2022-06-26 DIAGNOSIS — E870 Hyperosmolality and hypernatremia: Secondary | ICD-10-CM | POA: Diagnosis not present

## 2022-06-26 DIAGNOSIS — K219 Gastro-esophageal reflux disease without esophagitis: Secondary | ICD-10-CM | POA: Diagnosis present

## 2022-06-26 DIAGNOSIS — M1990 Unspecified osteoarthritis, unspecified site: Secondary | ICD-10-CM | POA: Diagnosis present

## 2022-06-26 DIAGNOSIS — I864 Gastric varices: Secondary | ICD-10-CM | POA: Diagnosis present

## 2022-06-26 DIAGNOSIS — Z951 Presence of aortocoronary bypass graft: Secondary | ICD-10-CM

## 2022-06-26 DIAGNOSIS — Z955 Presence of coronary angioplasty implant and graft: Secondary | ICD-10-CM

## 2022-06-26 DIAGNOSIS — E876 Hypokalemia: Secondary | ICD-10-CM | POA: Diagnosis not present

## 2022-06-26 DIAGNOSIS — Z7982 Long term (current) use of aspirin: Secondary | ICD-10-CM

## 2022-06-26 DIAGNOSIS — Z833 Family history of diabetes mellitus: Secondary | ICD-10-CM

## 2022-06-26 DIAGNOSIS — J690 Pneumonitis due to inhalation of food and vomit: Secondary | ICD-10-CM | POA: Diagnosis not present

## 2022-06-26 DIAGNOSIS — K7469 Other cirrhosis of liver: Secondary | ICD-10-CM | POA: Diagnosis present

## 2022-06-26 LAB — RESP PANEL BY RT-PCR (RSV, FLU A&B, COVID)  RVPGX2
Influenza A by PCR: NEGATIVE
Influenza B by PCR: NEGATIVE
Resp Syncytial Virus by PCR: NEGATIVE
SARS Coronavirus 2 by RT PCR: NEGATIVE

## 2022-06-26 LAB — CBC WITH DIFFERENTIAL/PLATELET
Abs Immature Granulocytes: 0.13 10*3/uL — ABNORMAL HIGH (ref 0.00–0.07)
Basophils Absolute: 0 10*3/uL (ref 0.0–0.1)
Basophils Relative: 0 %
Eosinophils Absolute: 0 10*3/uL (ref 0.0–0.5)
Eosinophils Relative: 0 %
HCT: 19.1 % — ABNORMAL LOW (ref 39.0–52.0)
Hemoglobin: 6 g/dL — ABNORMAL LOW (ref 13.0–17.0)
Immature Granulocytes: 1 %
Lymphocytes Relative: 9 %
Lymphs Abs: 1.5 10*3/uL (ref 0.7–4.0)
MCH: 31.7 pg (ref 26.0–34.0)
MCHC: 31.4 g/dL (ref 30.0–36.0)
MCV: 101.1 fL — ABNORMAL HIGH (ref 80.0–100.0)
Monocytes Absolute: 1.2 10*3/uL — ABNORMAL HIGH (ref 0.1–1.0)
Monocytes Relative: 7 %
Neutro Abs: 14.3 10*3/uL — ABNORMAL HIGH (ref 1.7–7.7)
Neutrophils Relative %: 83 %
Platelets: 139 10*3/uL — ABNORMAL LOW (ref 150–400)
RBC: 1.89 MIL/uL — ABNORMAL LOW (ref 4.22–5.81)
RDW: 23.3 % — ABNORMAL HIGH (ref 11.5–15.5)
WBC: 17.2 10*3/uL — ABNORMAL HIGH (ref 4.0–10.5)
nRBC: 0 % (ref 0.0–0.2)

## 2022-06-26 LAB — URINALYSIS, W/ REFLEX TO CULTURE (INFECTION SUSPECTED)
Bacteria, UA: NONE SEEN
Bilirubin Urine: NEGATIVE
Glucose, UA: NEGATIVE mg/dL
Hgb urine dipstick: NEGATIVE
Ketones, ur: 20 mg/dL — AB
Leukocytes,Ua: NEGATIVE
Nitrite: NEGATIVE
Protein, ur: NEGATIVE mg/dL
Specific Gravity, Urine: 1.018 (ref 1.005–1.030)
pH: 5 (ref 5.0–8.0)

## 2022-06-26 LAB — AMMONIA: Ammonia: 92 umol/L — ABNORMAL HIGH (ref 9–35)

## 2022-06-26 LAB — COMPREHENSIVE METABOLIC PANEL
ALT: 42 U/L (ref 0–44)
AST: 110 U/L — ABNORMAL HIGH (ref 15–41)
Albumin: 2 g/dL — ABNORMAL LOW (ref 3.5–5.0)
Alkaline Phosphatase: 120 U/L (ref 38–126)
Anion gap: 13 (ref 5–15)
BUN: 43 mg/dL — ABNORMAL HIGH (ref 8–23)
CO2: 22 mmol/L (ref 22–32)
Calcium: 7.8 mg/dL — ABNORMAL LOW (ref 8.9–10.3)
Chloride: 100 mmol/L (ref 98–111)
Creatinine, Ser: 1.47 mg/dL — ABNORMAL HIGH (ref 0.61–1.24)
GFR, Estimated: 53 mL/min — ABNORMAL LOW (ref >60)
Glucose, Bld: 131 mg/dL — ABNORMAL HIGH (ref 70–99)
Potassium: 5.1 mmol/L (ref 3.5–5.1)
Sodium: 135 mmol/L (ref 135–145)
Total Bilirubin: 2.6 mg/dL — ABNORMAL HIGH (ref 0.3–1.2)
Total Protein: 6.4 g/dL — ABNORMAL LOW (ref 6.5–8.1)

## 2022-06-26 LAB — BLOOD GAS, VENOUS
Acid-Base Excess: 3 mmol/L — ABNORMAL HIGH (ref 0.0–2.0)
Bicarbonate: 27.1 mmol/L (ref 20.0–28.0)
O2 Saturation: 66.6 %
Patient temperature: 37
pCO2, Ven: 39 mmHg — ABNORMAL LOW (ref 44–60)
pH, Ven: 7.45 — ABNORMAL HIGH (ref 7.25–7.43)
pO2, Ven: 40 mmHg (ref 32–45)

## 2022-06-26 LAB — TYPE AND SCREEN: Unit division: 0

## 2022-06-26 LAB — BPAM RBC: Blood Product Expiration Date: 202406282359

## 2022-06-26 LAB — LACTIC ACID, PLASMA
Lactic Acid, Venous: 2.1 mmol/L (ref 0.5–1.9)
Lactic Acid, Venous: 1.9 mmol/L (ref 0.5–1.9)

## 2022-06-26 LAB — PROTIME-INR
INR: 1.8 — ABNORMAL HIGH (ref 0.8–1.2)
Prothrombin Time: 21.1 seconds — ABNORMAL HIGH (ref 11.4–15.2)

## 2022-06-26 LAB — APTT: aPTT: 22 seconds — ABNORMAL LOW (ref 24–36)

## 2022-06-26 LAB — PREPARE RBC (CROSSMATCH)

## 2022-06-26 MED ORDER — ONDANSETRON HCL 4 MG/2ML IJ SOLN: 4.0000 mg | Freq: Four times a day (QID) | INTRAMUSCULAR | Status: DC | PRN

## 2022-06-26 MED ORDER — LACTULOSE 10 GM/15ML PO SOLN: 10.0000 g | Freq: Three times a day (TID) | ORAL | Status: DC

## 2022-06-26 MED ORDER — SODIUM CHLORIDE 0.9 % IV BOLUS
1000.0000 mL | Freq: Once | INTRAVENOUS | Status: AC
  Administered 2022-06-26: 1000 mL via INTRAVENOUS

## 2022-06-26 MED ORDER — SODIUM CHLORIDE 0.9 % IV SOLN
2.0000 g | Freq: Once | INTRAVENOUS | Status: AC
  Administered 2022-06-26: 2 g via INTRAVENOUS
  Filled 2022-06-26: qty 20

## 2022-06-26 MED ORDER — SODIUM CHLORIDE 0.9% FLUSH
3.0000 mL | Freq: Two times a day (BID) | INTRAVENOUS | Status: DC
  Administered 2022-06-26 – 2022-07-03 (×12): 3 mL via INTRAVENOUS

## 2022-06-26 MED ORDER — SODIUM CHLORIDE 0.9 % IV SOLN
2.0000 g | INTRAVENOUS | Status: DC
  Administered 2022-06-27 – 2022-06-29 (×3): 2 g via INTRAVENOUS
  Filled 2022-06-26 (×3): qty 20

## 2022-06-26 MED ORDER — ACETAMINOPHEN 325 MG PO TABS: 650.0000 mg | ORAL_TABLET | Freq: Three times a day (TID) | ORAL | Status: DC | PRN

## 2022-06-26 MED ORDER — MIDODRINE HCL 5 MG PO TABS: 10.0000 mg | ORAL_TABLET | Freq: Once | ORAL | Status: DC

## 2022-06-26 MED ORDER — SODIUM CHLORIDE 0.9 % IV SOLN: 10.0000 mL/h | Freq: Once | INTRAVENOUS | Status: DC

## 2022-06-26 MED ORDER — TENOFOVIR DISOPROXIL FUMARATE 300 MG PO TABS
300.0000 mg | ORAL_TABLET | Freq: Every day | ORAL | Status: DC
  Filled 2022-06-26 (×2): qty 1

## 2022-06-26 MED ORDER — SODIUM CHLORIDE 0.9 % IV SOLN
50.0000 ug/h | INTRAVENOUS | Status: AC
  Administered 2022-06-27 – 2022-07-01 (×13): 50 ug/h via INTRAVENOUS
  Filled 2022-06-26 (×14): qty 1

## 2022-06-26 MED ORDER — LACTATED RINGERS IV SOLN: INTRAVENOUS | Status: DC

## 2022-06-26 MED ORDER — CARVEDILOL 6.25 MG PO TABS: 6.2500 mg | ORAL_TABLET | Freq: Every day | ORAL | Status: DC

## 2022-06-26 MED ORDER — LACTATED RINGERS IV SOLN: INTRAVENOUS | Status: AC

## 2022-06-26 MED ORDER — IPRATROPIUM-ALBUTEROL 0.5-2.5 (3) MG/3ML IN SOLN
3.0000 mL | Freq: Four times a day (QID) | RESPIRATORY_TRACT | Status: DC | PRN
  Administered 2022-06-29: 3 mL via RESPIRATORY_TRACT
  Filled 2022-06-26: qty 3

## 2022-06-26 MED ORDER — THIAMINE HCL 100 MG/ML IJ SOLN
500.0000 mg | Freq: Once | INTRAVENOUS | Status: AC
  Administered 2022-06-26: 500 mg via INTRAVENOUS
  Filled 2022-06-26: qty 5

## 2022-06-26 MED ORDER — OCTREOTIDE LOAD VIA INFUSION
80.0000 ug | Freq: Once | INTRAVENOUS | Status: AC
  Administered 2022-06-27: 80 ug via INTRAVENOUS
  Filled 2022-06-26: qty 40

## 2022-06-26 MED ORDER — OXYCODONE HCL 5 MG PO TABS: 5.0000 mg | ORAL_TABLET | ORAL | Status: DC | PRN

## 2022-06-26 MED ORDER — PANTOPRAZOLE 80MG IVPB - SIMPLE MED
80.0000 mg | Freq: Once | INTRAVENOUS | Status: AC
  Administered 2022-06-27: 80 mg via INTRAVENOUS
  Filled 2022-06-26: qty 100

## 2022-06-26 MED ORDER — SERTRALINE HCL 50 MG PO TABS: 200.0000 mg | ORAL_TABLET | Freq: Every day | ORAL | Status: DC

## 2022-06-26 MED ORDER — INSULIN ASPART 100 UNIT/ML IJ SOLN: 0.0000 [IU] | Freq: Three times a day (TID) | INTRAMUSCULAR | Status: DC

## 2022-06-26 MED ORDER — ALBUMIN HUMAN 25 % IV SOLN
100.0000 g | INTRAVENOUS | Status: AC
  Administered 2022-06-27 – 2022-06-28 (×2): 100 g via INTRAVENOUS
  Filled 2022-06-26 (×2): qty 400

## 2022-06-26 MED ORDER — PANTOPRAZOLE INFUSION (NEW) - SIMPLE MED
8.0000 mg/h | INTRAVENOUS | Status: AC
  Administered 2022-06-27 – 2022-06-29 (×7): 8 mg/h via INTRAVENOUS
  Filled 2022-06-26 (×8): qty 100

## 2022-06-26 MED ORDER — PANTOPRAZOLE SODIUM 40 MG IV SOLR
40.0000 mg | Freq: Two times a day (BID) | INTRAVENOUS | Status: DC
  Administered 2022-06-30 – 2022-07-03 (×7): 40 mg via INTRAVENOUS
  Filled 2022-06-26 (×7): qty 10

## 2022-06-26 MED ORDER — PANTOPRAZOLE SODIUM 40 MG PO TBEC: 40.0000 mg | DELAYED_RELEASE_TABLET | Freq: Two times a day (BID) | ORAL | Status: DC

## 2022-06-26 MED ORDER — DULOXETINE HCL 20 MG PO CPEP
20.0000 mg | ORAL_CAPSULE | Freq: Every day | ORAL | Status: DC
  Filled 2022-06-26: qty 1

## 2022-06-26 MED ORDER — THIAMINE MONONITRATE 100 MG PO TABS: 100.0000 mg | ORAL_TABLET | Freq: Every day | ORAL | Status: DC

## 2022-06-26 MED ORDER — FLUTICASONE FUROATE-VILANTEROL 100-25 MCG/ACT IN AEPB
1.0000 | INHALATION_SPRAY | Freq: Every day | RESPIRATORY_TRACT | Status: DC
  Filled 2022-06-26: qty 28

## 2022-06-26 MED ORDER — SODIUM CHLORIDE 0.9 % IV BOLUS (SEPSIS)
500.0000 mL | Freq: Once | INTRAVENOUS | Status: AC
  Administered 2022-06-26: 500 mL via INTRAVENOUS

## 2022-06-26 MED ORDER — ONDANSETRON HCL 4 MG PO TABS: 4.0000 mg | ORAL_TABLET | Freq: Four times a day (QID) | ORAL | Status: DC | PRN

## 2022-06-26 MED ORDER — ALBUTEROL SULFATE HFA 108 (90 BASE) MCG/ACT IN AERS: 2.0000 | INHALATION_SPRAY | Freq: Four times a day (QID) | RESPIRATORY_TRACT | Status: DC | PRN

## 2022-06-26 MED ORDER — METRONIDAZOLE 500 MG/100ML IV SOLN
500.0000 mg | Freq: Two times a day (BID) | INTRAVENOUS | Status: DC
  Administered 2022-06-27 – 2022-06-30 (×7): 500 mg via INTRAVENOUS
  Filled 2022-06-26 (×7): qty 100

## 2022-06-26 MED ORDER — LACTULOSE 10 GM/15ML PO SOLN: 30.0000 g | Freq: Once | ORAL | Status: DC

## 2022-06-26 MED ORDER — METRONIDAZOLE 500 MG/100ML IV SOLN
500.0000 mg | Freq: Once | INTRAVENOUS | Status: AC
  Administered 2022-06-26: 500 mg via INTRAVENOUS
  Filled 2022-06-26: qty 100

## 2022-06-26 NOTE — Assessment & Plan Note (Signed)
Hypovolemia versus acute worsening of his hepatic function, trend CMP

## 2022-06-26 NOTE — Assessment & Plan Note (Signed)
Presumed diagnosis in setting of likely GI bleed, sepsis physiology. - Continue ceftriaxone and Flagyl - Given presence of AKI will also give weight-based albumin today and on day 3 - Consider IR consultation for paracentesis in a.m.

## 2022-06-26 NOTE — Assessment & Plan Note (Signed)
Hypovolemia in setting of severe sepsis/SBP versus developing HRS, favor the former.  Already status post 1.5 L fluid resuscitation. - Continue LR at 150 cc an hour x 12 hours - Give albumin 100 g today and on day 3 - Trend CMP

## 2022-06-26 NOTE — H&P (Signed)
History and Physical    Patient: Barry [name]:[id number] DOB: 1955/09/29 DOA: 06/26/2022 DOS: the patient was seen and examined on 06/26/2022 PCP: [name], MD  Patient coming from: SNF  Chief Complaint:  Chief Complaint  Patient presents with   Altered Mental Status   HPI: Barry [name] is a 67 y.o. male with medical history significant for decompensated cirrhosis with ascites and varices, CVA, type 2 diabetes, CAD, depression/anxiety, CKD stage III, who presents from SNF with altered mental status.  On my evaluation of the patient he is arousable but does not answer questions appropriately and just mutters incoherently.  As such majority of history obtained from signout from ED provider and the chart.  Per the above it was noticed that he had an abnormal mental status at his skilled nursing facility earlier today.  He was recently transferred to this SNF from [facility] after recovering from a fall.  Review of discharge summary from this recent admission showed that he was transfused 4 units of packed red blood cells and underwent an EGD on May 7 which showed grade 2 varices without active bleeding as well as portal hypertensive gastropathy with active oozing in the antrum.  He was also diagnosed with acute hepatitis B and started on tenofovir.  He also had an AKI which resolved.  He had acute metabolic encephalopathy as well which also returned to baseline.  In the ED vital signs notable for tachycardia in the low 100s, mild tachypnea in the 20s, and low normal blood pressures in the 100-110s over 50s.  CBC notable for hemoglobin of 6 down from 8.5 three weeks prior, elevated white count of 17.2 from 3.1 three weeks prior, and platelets improved from 50 to 139.  CMP showed new AKI with creatinine 1.47 up from 0.74, calcium 7.8, albumin 2.0, AST 110, total bilirubin 2.6 up from 1.1 on last check.  Lactic acid was initially 2.1 and down trended to 1.9 with fluid resuscitation.  INR was 1.8,  ammonia was 92.  Respiratory viral panel was negative.  Chest x-ray was obtained which did not show any new or acute findings.  CT head without contrast was also unremarkable, though question of left mastoiditis.  Blood cultures were obtained, he was given 1.5 L normal saline bolus, IV thiamine, started on ceftriaxone and Flagyl, and ordered for 1 unit of packed RBCs.   Review of Systems: unable to review all systems due to the inability of the patient to answer questions. Past Medical History:  Diagnosis Date   Anemia    Anxiety    Arthritis    Cerebrovascular disease    Cervical disc disorder    Chronic kidney disease    Cirrhosis of liver (HCC)    Coronary artery disease    Depression    Dyspnea    with exertion   Esophageal varices (HCC)    hx of   Family history of colon cancer 10/15/2019   GERD (gastroesophageal reflux disease)    Headache    Hyperlipidemia    Hyperlipidemia    Hypocalcemia    MI (myocardial infarction) (HCC) 04/23/2007   inferior wall   Morbid obesity (HCC) 06/26/2012   Neuropathy    secondary to diabetes   Pancreatitis    hs of   S/P CABG x 3 07/04/2012   LIMA to LAD, SVG to D1, SVG to PDA, EVH via right thigh   Sarcoidosis of skin    Sleep apnea    no  cpap   Stroke (HCC)    small stroke - 2023 - legs weak , righ tleg weak   Type II or unspecified type diabetes mellitus without mention of complication, not stated as uncontrolled    Unspecified essential hypertension    Past Surgical History:  Procedure Laterality Date   CORONARY ANGIOPLASTY WITH STENT PLACEMENT  04/23/2007   PCI and stenting of mid RCA - Dr Bary Castilla @ [facility]   CORONARY ARTERY BYPASS GRAFT N/A 07/04/2012   Procedure: CORONARY ARTERY BYPASS GRAFTING (CABG);  Surgeon: [name], MD;  Location: [facility] OR;  Service: Open Heart Surgery;  Laterality: N/A;  x3 using right greater saphenous vein and left internal mammary.    ESOPHAGOGASTRODUODENOSCOPY (EGD) WITH PROPOFOL N/A 06/01/2022    Procedure: ESOPHAGOGASTRODUODENOSCOPY (EGD) WITH PROPOFOL;  Surgeon: Meryl Dare, MD;  Location: [facility] ENDOSCOPY;  Service: Gastroenterology;  Laterality: N/A;   INTRAOPERATIVE TRANSESOPHAGEAL ECHOCARDIOGRAM N/A 07/04/2012   Procedure: INTRAOPERATIVE TRANSESOPHAGEAL ECHOCARDIOGRAM;  Surgeon: [name], MD;  Location: [facility] OR;  Service: Open Heart Surgery;  Laterality: N/A;   LEFT HEART CATH AND CORS/GRAFTS ANGIOGRAPHY N/A 04/18/2017   Procedure: LEFT HEART CATH AND CORS/GRAFTS ANGIOGRAPHY;  Surgeon: [name], MD;  Location: [facility] INVASIVE CV LAB;  Service: Cardiovascular;  Laterality: N/A;   LEFT HEART CATHETERIZATION WITH CORONARY ANGIOGRAM N/A 06/25/2012   Procedure: LEFT HEART CATHETERIZATION WITH CORONARY ANGIOGRAM;  Surgeon: [name], MD;  Location: [facility] CATH LAB;  Service: Cardiovascular;  Laterality: N/A;   TOOTH EXTRACTION  04/2018   4 teeth pulled    Social History:  reports that he has never smoked. He has never used smokeless tobacco. He reports that he does not drink alcohol and does not use drugs.  No Known Allergies  Family History  Problem Relation Age of Onset   Cancer Mother        unknown type; dx late 69s   Cancer Father        unknown type; dx > 50yo   Colon cancer Brother        dx late 83s   Colon cancer Brother        dx late 13s; dx 35   Cancer Sister        unknown type; dx late 39s   Diabetes Sister    Diabetes Brother    Cancer Maternal Aunt        unknown type; dx > 50yo    Prior to Admission medications   Medication Sig Start Date End Date Taking? Authorizing Provider  acetaminophen (TYLENOL) 500 MG tablet Take 1 tablet (500 mg total) by mouth every 8 (eight) hours as needed for moderate pain. 04/24/22  Yes [name], MD  albuterol (PROVENTIL HFA;VENTOLIN HFA) [phone] Base) MCG/ACT inhaler Inhale 2 puffs into the lungs every 6 (six) hours as needed for wheezing or shortness of breath. 04/21/17  Yes Kirt Boys, DO  aspirin 81 MG  chewable tablet Chew 81 mg by mouth daily.   Yes [provider]  atorvastatin (LIPITOR) 80 MG tablet Take 1 tablet (80 mg total) by mouth daily. Patient taking differently: Take 80 mg by mouth every evening. 05/08/22  Yes [name], MD  busPIRone (BUSPAR) 15 MG tablet TAKE 1 TABLET BY MOUTH THREE TIMES DAILY FOR ANXIETY Patient taking differently: Take 15 mg by mouth 3 (three) times daily. 07/19/17  Yes Montez Morita, Monica, DO  carvedilol (COREG) 6.25 MG tablet Take 1 tablet (6.25 mg total) by mouth daily  at 12 noon. 06/06/22  Yes Gherghe, Daylene Katayama, MD  cyclobenzaprine (FLEXERIL) 10 MG tablet Take 10 mg by mouth at bedtime. May take an additional 10 mg up to twice daily as needed for pain related to RIGHT HIP, DO NOT GIVE WITHIN 6 HOURS OF SCHEDULED DOSE 07/23/19  Yes [provider]  DULoxetine (CYMBALTA) 20 MG capsule Take 20 mg by mouth daily.   Yes [provider]  FEROSUL 325 (65 Fe) MG tablet Take 325 mg by mouth daily. 10/13/21  Yes [provider]  fluticasone-salmeterol (ADVAIR) 100-50 MCG/ACT AEPB Inhale 1 puff into the lungs 2 (two) times daily.   Yes [provider]  furosemide (LASIX) 40 MG tablet Take 40 mg by mouth daily.   Yes [provider]  gabapentin (NEURONTIN) 300 MG capsule Take 300 mg by mouth in the morning and at bedtime. 09/12/19  Yes [provider]  ipratropium-albuterol (DUONEB) 0.5-2.5 (3) MG/3ML SOLN Take 3 mLs by nebulization every 6 (six) hours as needed.   Yes [provider]  lactulose (CHRONULAC) 10 GM/15ML solution Take 15 mLs (10 g total) by mouth 3 (three) times daily. 04/24/22  Yes [name], MD  metFORMIN (GLUCOPHAGE) 1000 MG tablet TAKE 1 TABLET(1000 MG) BY MOUTH TWICE DAILY Patient taking differently: Take 1,000 mg by mouth 2 (two) times daily with a meal. 09/09/17  Yes Kirt Boys, DO  oxyCODONE-acetaminophen (PERCOCET) 10-325 MG tablet Take 1 tablet by mouth every 8 (eight)  hours as needed for pain. 06/05/22  Yes Gherghe, Daylene Katayama, MD  pantoprazole (PROTONIX) 40 MG tablet Take 1 tablet (40 mg total) by mouth 2 (two) times daily before a meal. TAKE 1 TABLET BY MOUTH EVERY DAY 06/05/22  Yes Gherghe, Daylene Katayama, MD  potassium chloride (KLOR-CON) 10 MEQ tablet Take 10 mEq by mouth 2 (two) times daily.   Yes [provider]  sertraline (ZOLOFT) 100 MG tablet TAKE 2 TABLETS(200 MG) BY MOUTH DAILY Patient taking differently: Take 200 mg by mouth daily. 04/20/19  Yes Sharon Seller, NP  tenofovir (VIREAD) 300 MG tablet Take 1 tablet (300 mg total) by mouth daily. 06/06/22  Yes Leatha Gilding, MD  thiamine (VITAMIN B-1) 100 MG tablet Take 1 tablet (100 mg total) by mouth daily. 03/06/22  Yes [name], MD  Blood Glucose Monitoring Suppl (CONTOUR NEXT EZ MONITOR) w/Device KIT Test blood sugar three times daily E11.22 03/08/16   Edison Pace, RPH-CPP  cetirizine (ZYRTEC) 10 MG tablet Take 10 mg by mouth daily.    [provider]    Physical Exam: Vitals:   06/26/22 1730 06/26/22 1830 06/26/22 1900 06/26/22 2015  BP: (!) 116/56 (!) 105/50    Pulse: (!) 109 (!) 110 (!) 114 100  Resp: 19 19 (!) 25 (!) 22  Temp:      TempSrc:      SpO2: 100% 100% 100% 100%  Weight:      Height:       Physical Exam Constitutional:      General: He is not in acute distress.    Comments: Arousable but incoherent and not answering questions appropriately.  Chronically ill-appearing.  HENT:     Head:     Comments: Large ecchymoses present around left eye Eyes:     General: No scleral icterus. Cardiovascular:     Rate and Rhythm: Normal rate and regular rhythm.     Heart sounds: Normal heart sounds.  Pulmonary:     Effort: Pulmonary  effort is normal.     Breath sounds: Normal breath sounds.  Abdominal:     General: Bowel sounds are normal. There is distension.  Musculoskeletal:     Right lower leg: Edema present.     Left lower leg: Edema present.      Comments: Trace edema in bilateral lower extremities  Skin:    General: Skin is warm and dry.  Neurological:     Mental Status: He is disoriented.      Data Reviewed: Results for orders placed or performed during the hospital encounter of 06/26/22 (from the past 24 hour(s))  Blood gas, venous     Status: Abnormal   Collection Time: 06/26/22  5:13 PM  Result Value Ref Range   pH, Ven 7.45 (H) 7.25 - 7.43   pCO2, Ven 39 (L) 44 - 60 mmHg   pO2, Ven 40 32 - 45 mmHg   Bicarbonate 27.1 20.0 - 28.0 mmol/L   Acid-Base Excess 3.0 (H) 0.0 - 2.0 mmol/L   O2 Saturation 66.6 %   Patient temperature 37.0    Collection site VEIN   Urinalysis, w/ Reflex to Culture (Infection Suspected) -Urine, Clean Catch     Status: Abnormal   Collection Time: 06/26/22  5:13 PM  Result Value Ref Range   Specimen Source URINE, CLEAN CATCH    Color, Urine YELLOW (A) YELLOW   APPearance CLEAR (A) CLEAR   Specific Gravity, Urine 1.018 1.005 - 1.030   pH 5.0 5.0 - 8.0   Glucose, UA NEGATIVE NEGATIVE mg/dL   Hgb urine dipstick NEGATIVE NEGATIVE   Bilirubin Urine NEGATIVE NEGATIVE   Ketones, ur 20 (A) NEGATIVE mg/dL   Protein, ur NEGATIVE NEGATIVE mg/dL   Nitrite NEGATIVE NEGATIVE   Leukocytes,Ua NEGATIVE NEGATIVE   WBC, UA 0-5 0 - 5 WBC/hpf   Bacteria, UA NONE SEEN NONE SEEN   Squamous Epithelial / HPF 0-5 0 - 5 /HPF   Mucus PRESENT    Hyaline Casts, UA PRESENT   Resp panel by RT-PCR (RSV, Flu A&B, Covid) Anterior Nasal Swab     Status: None   Collection Time: 06/26/22  5:21 PM   Specimen: Anterior Nasal Swab  Result Value Ref Range   SARS Coronavirus 2 by RT PCR NEGATIVE NEGATIVE   Influenza A by PCR NEGATIVE NEGATIVE   Influenza B by PCR NEGATIVE NEGATIVE   Resp Syncytial Virus by PCR NEGATIVE NEGATIVE  Comprehensive metabolic panel     Status: Abnormal   Collection Time: 06/26/22  5:21 PM  Result Value Ref Range   Sodium 135 135 - 145 mmol/L   Potassium 5.1 3.5 - 5.1 mmol/L   Chloride 100 98 -  111 mmol/L   CO2 22 22 - 32 mmol/L   Glucose, Bld 131 (H) 70 - 99 mg/dL   BUN 43 (H) 8 - 23 mg/dL   Creatinine, Ser 1.61 (H) 0.61 - 1.24 mg/dL   Calcium 7.8 (L) 8.9 - 10.3 mg/dL   Total Protein 6.4 (L) 6.5 - 8.1 g/dL   Albumin 2.0 (L) 3.5 - 5.0 g/dL   AST 096 (H) 15 - 41 U/L   ALT 42 0 - 44 U/L   Alkaline Phosphatase 120 38 - 126 U/L   Total Bilirubin 2.6 (H) 0.3 - 1.2 mg/dL   GFR, Estimated 53 (L) >60 mL/min   Anion gap 13 5 - 15  Protime-INR     Status: Abnormal   Collection Time: 06/26/22  5:21 PM  Result Value Ref  Range   Prothrombin Time 21.1 (H) 11.4 - 15.2 seconds   INR 1.8 (H) 0.8 - 1.2  APTT     Status: Abnormal   Collection Time: 06/26/22  5:21 PM  Result Value Ref Range   aPTT 22 (L) 24 - 36 seconds  Lactic acid, plasma     Status: Abnormal   Collection Time: 06/26/22  5:23 PM  Result Value Ref Range   Lactic Acid, Venous 2.1 (HH) 0.5 - 1.9 mmol/L  CBC with Differential/Platelet     Status: Abnormal   Collection Time: 06/26/22  6:19 PM  Result Value Ref Range   WBC 17.2 (H) 4.0 - 10.5 K/uL   RBC 1.89 (L) 4.22 - 5.81 MIL/uL   Hemoglobin 6.0 (L) 13.0 - 17.0 g/dL   HCT 29.5 (L) 62.1 - 30.8 %   MCV 101.1 (H) 80.0 - 100.0 fL   MCH 31.7 26.0 - 34.0 pg   MCHC 31.4 30.0 - 36.0 g/dL   RDW 65.7 (H) 84.6 - 96.2 %   Platelets 139 (L) 150 - 400 K/uL   nRBC 0.0 0.0 - 0.2 %   Neutrophils Relative % 83 %   Neutro Abs 14.3 (H) 1.7 - 7.7 K/uL   Lymphocytes Relative 9 %   Lymphs Abs 1.5 0.7 - 4.0 K/uL   Monocytes Relative 7 %   Monocytes Absolute 1.2 (H) 0.1 - 1.0 K/uL   Eosinophils Relative 0 %   Eosinophils Absolute 0.0 0.0 - 0.5 K/uL   Basophils Relative 0 %   Basophils Absolute 0.0 0.0 - 0.1 K/uL   Immature Granulocytes 1 %   Abs Immature Granulocytes 0.13 (H) 0.00 - 0.07 K/uL  Ammonia     Status: Abnormal   Collection Time: 06/26/22  6:19 PM  Result Value Ref Range   Ammonia 92 (H) 9 - 35 umol/L  Prepare RBC (crossmatch)     Status: None   Collection Time:  06/26/22  7:44 PM  Result Value Ref Range   Order Confirmation      ORDER PROCESSED BY BLOOD BANK Performed at [facility], [address] [place], [place] [address]   Lactic acid, plasma     Status: None   Collection Time: 06/26/22  9:16 PM  Result Value Ref Range   Lactic Acid, Venous 1.9 0.5 - 1.9 mmol/L  Type and screen [facility]     Status: None (Preliminary result)   Collection Time: 06/26/22  9:16 PM  Result Value Ref Range   ABO/RH(D) A POS    Antibody Screen NEG    Sample Expiration      06/29/2022,2359 Performed at [facility], [address] [place], [place] [address]    Unit Number [id number]    Blood Component Type RED CELLS,LR    Unit division 00    Status of Unit ALLOCATED    Transfusion Status OK TO TRANSFUSE    Crossmatch Result Compatible   ABO/Rh     Status: None   Collection Time: 06/26/22  9:16 PM  Result Value Ref Range   ABO/RH(D)      A POS Performed at [facility], [address] [place], [place] [address]    CT Head Wo Contrast CLINICAL DATA:  Mental status change of unknown cause. Fall 2 days ago.  EXAM: CT HEAD WITHOUT CONTRAST  TECHNIQUE: Contiguous axial images were obtained from the base of the skull through the vertex without intravenous contrast.  RADIATION DOSE REDUCTION: This  exam was performed according to the departmental dose-optimization program which includes automated exposure control, adjustment of the mA and/or kV according to patient size and/or use of iterative reconstruction technique.  COMPARISON:  05/30/2022  FINDINGS: Brain: No evidence of acute infarction, hemorrhage, hydrocephalus, extra-axial collection or mass lesion/mass effect. Mild cerebral atrophy.  Vascular: No hyperdense vessel or unexpected calcification.  Skull: Normal. Negative for fracture or focal lesion.  Sinuses/Orbits: Paranasal sinuses demonstrate mild mucosal thickening.  Opacification of the left mastoid air cells with air-fluid level possibly indicating mastoiditis. Right ocular prosthesis.  Other: None.  IMPRESSION: 1. No acute intracranial abnormalities.  Mild cerebral atrophy. 2. Air-fluid levels in the left mastoid air cells may indicate mastoiditis. No acute fractures are identified.  Electronically Signed   By: Burman Nieves M.D.   On: 06/26/2022 18:06 DG Chest Port 1 View CLINICAL DATA:  Questionable sepsis.  EXAM: PORTABLE CHEST 1 VIEW  COMPARISON:  May 30, 2022  FINDINGS: The cardiomediastinal silhouette is normal. Obscuration left hemidiaphragm, also identified on the previous study. No definite infiltrate on the right. No other abnormalities.  IMPRESSION: Stable obscuration of the left hemidiaphragm is favored represent atelectasis. A PA and lateral chest x-ray could better evaluate. No other abnormalities.  Electronically Signed   By: Gerome Sam III M.D.   On: 06/26/2022 17:38     Assessment and Plan: Barry [name] is a 67 y.o. male with medical history significant for decompensated cirrhosis with ascites and varices, CVA, type 2 diabetes, CAD, depression/anxiety, CKD stage III, who presents from SNF with altered mental status and is found to have acute anemia requiring blood transfusion, AKI, hyperbilirubinemia.   * Hepatic encephalopathy (HCC) Multifactorial in setting of sepsis, SBP. - Treat underlying factors - Continue lactulose - Consult GI in a.m.  Hyperbilirubinemia Hypovolemia versus acute worsening of his hepatic function, trend CMP  Known medical problems Neuropathy-continue duloxetine.  Hold gabapentin. Asthma-continue ICS and albuterol/ipratropium as needed Anxiety/depression-continue sertraline, hold BuSpar Acute hepatitis B-continue tenofovir DM2-very sensitive sliding scale insulin correction.  Hold metformin in setting of AKI. CAD-hold aspirin in setting of likely UGIB Hyperlipidemia-hold  atorvastatin in setting of transaminitis Hypertension-hold carvedilol in setting of likely upper GI bleed and SBP  Hold Lasix in setting of AKI  Upper GI bleed Presumed in setting of worsening anemia as well as known esophageal and gastric varices. - Transfusing 1 unit packed RBCs - Trend CBC Q6 - Hold beta-blocker - Start Octreotide gtt - Hold p.o. PPI, start IV pantoprazole gtt  SBP (spontaneous bacterial peritonitis) (HCC) Presumed diagnosis in setting of likely GI bleed, sepsis physiology. - Continue ceftriaxone and Flagyl - Given presence of AKI will also give weight-based albumin today and on day 3 - Consider IR consultation for paracentesis in a.m.  Severe sepsis [facility]) Meeting criteria with elevated white count, tachycardia, and suspected SBP.  Initial lactic acid abated 2.1 but is down trended to 1.9 with fluid resuscitation. - Continue ceftriaxone and Flagyl  AKI (acute kidney injury) (HCC) Hypovolemia in setting of severe sepsis/SBP versus developing HRS, favor the former.  Already status post 1.5 L fluid resuscitation. - Continue LR at 150 cc an hour x 12 hours - Give albumin 100 g today and on day 3 - Trend CMP    Advance Care Planning:   Code Status: Full Code   Consults: GI in AM  Family Communication: none  Severity of Illness: The appropriate patient status for this patient is INPATIENT. Inpatient status is judged to be reasonable  and necessary in order to provide the required intensity of service to ensure the patient's safety. The patient's presenting symptoms, physical exam findings, and initial radiographic and laboratory data in the context of their chronic comorbidities is felt to place them at high risk for further clinical deterioration. Furthermore, it is not anticipated that the patient will be medically stable for discharge from the hospital within 2 midnights of admission.   * I certify that at the point of admission it is my clinical judgment that  the patient will require inpatient hospital care spanning beyond 2 midnights from the point of admission due to high intensity of service, high risk for further deterioration and high frequency of surveillance required.*  Author: Venora Maples, MD 06/26/2022 10:40 PM  For on call review [email or web address].

## 2022-06-26 NOTE — Assessment & Plan Note (Signed)
Meeting criteria with elevated white count, tachycardia, and suspected SBP.  Initial lactic acid abated 2.1 but is down trended to 1.9 with fluid resuscitation. - Continue ceftriaxone and Flagyl

## 2022-06-26 NOTE — ED Provider Notes (Signed)
[facility] Provider Note   Event Date/Time   First MD Initiated Contact with Patient 06/26/22 1710     (approximate) History  No chief complaint on file.  HPI Barry [name] is a 67 y.o. male with a past medical history of cirrhosis who presents from long-term care facility via EMS after being found with decreased GCS at approximately 2 PM today.  Patient's last known well was at 7 AM where staff noted patient to be somewhat drowsy but figured that it was early in the morning.  Patient had continuing decreased mental status over the course of the day until calling EMS.  EMS have found patient with a GCS of 13 with confused nonsensical speech and not following commands. ROS: Unable to assess Physical Exam  Triage Vital Signs: ED Triage Vitals  Enc Vitals Group     BP      Pulse      Resp      Temp      Temp src      SpO2      Weight      Height      Head Circumference      Peak Flow      Pain Score      Pain Loc      Pain Edu?      Excl. in GC?    Most recent vital signs: There were no vitals filed for this visit. General: Awake, GCS 13 CV:  Good peripheral perfusion.  Resp:  Normal effort.  Abd:  No distention.  Obese, no fluid wave.  No distinct fluid collection found on ultrasound Other:  Elderly obese Caucasian male laying in bed confused with nonsensical speech and moving extremities nonpurposeful ED Results / Procedures / Treatments  Labs (all labs ordered are listed, but only abnormal results are displayed) Labs Reviewed - No data to display EKG ED ECG REPORT I, Merwyn Katos, the attending physician, personally viewed and interpreted this ECG. Date: 06/26/2022 EKG Time: 1730 Rate: 112 Rhythm: Tachycardic sinus rhythm QRS Axis: normal Intervals: normal ST/T Wave abnormalities: normal Narrative Interpretation: Tachycardic sinus rhythm.  No evidence of acute ischemia RADIOLOGY ED MD interpretation:  *** -Agree with radiology  assessment Official radiology report(s): No results found. PROCEDURES: Critical Care performed: {CriticalCareYesNo:19197::"Yes, see critical care procedure note(s)","No"} Procedures MEDICATIONS ORDERED IN ED: Medications - No data to display IMPRESSION / MDM / ASSESSMENT AND PLAN / ED COURSE  I reviewed the triage vital signs and the nursing notes.                             {**The patient is on the cardiac monitor to evaluate for evidence of arrhythmia and/or significant heart rate changes.**} Patient's presentation is most consistent with {EM COPA:27473} {Remember to include, when applicable, any/all of the following data: independent review of imaging independent review of labs (comment specifically on pertinent positives and negatives) review of specific prior hospitalizations, PCP/specialist notes, etc. discuss meds given and prescribed document any discussion with consultants (including hospitalists) any clinical decision tools you used and why (PECARN, NEXUS, etc.) did you consider admitting the patient? document social determinants of health affecting patient's care (homelessness, inability to follow up in a timely fashion, etc) document any pre-existing conditions increasing risk on current visit (e.g. diabetes and HTN increasing danger of high-risk chest pain/ACS) describes what meds you gave (especially parenteral) and why any other interventions?:1}  FINAL CLINICAL IMPRESSION(S) / ED DIAGNOSES   Final diagnoses:  None   Rx / DC Orders   ED Discharge Orders     None      Note:  This document was prepared using Dragon voice recognition software and may include unintentional dictation errors.

## 2022-06-26 NOTE — Assessment & Plan Note (Addendum)
Multifactorial in setting of sepsis, SBP. - Treat underlying factors - Continue lactulose - Consult GI in a.m.

## 2022-06-26 NOTE — ED Triage Notes (Signed)
Sent from SNF for altered mental status; not following commands, mumbling. Last known well at 7am today. Hx of cirrhosis - on lactulose Transferred 2 days ago to SNF from [facility] after recovering from a fall

## 2022-06-26 NOTE — Assessment & Plan Note (Addendum)
Neuropathy-continue duloxetine.  Hold gabapentin. Asthma-continue ICS and albuterol/ipratropium as needed Anxiety/depression-continue sertraline, hold BuSpar Acute hepatitis B-continue tenofovir DM2-very sensitive sliding scale insulin correction.  Hold metformin in setting of AKI. CAD-hold aspirin in setting of likely UGIB Hyperlipidemia-hold atorvastatin in setting of transaminitis Hypertension-hold carvedilol in setting of likely upper GI bleed and SBP  Hold Lasix in setting of AKI

## 2022-06-26 NOTE — Consult Note (Signed)
CODE SEPSIS - PHARMACY COMMUNICATION  **Broad Spectrum Antibiotics should be administered within 1 hour of Sepsis diagnosis**  Time Code Sepsis Called/Page Received: 1720  Antibiotics Ordered: ceftriaxone and metronidazole  Time of 1st antibiotic administration: 1741  Additional action taken by pharmacy: none  If necessary, Name of Provider/Nurse Contacted: n/a    Barry [name] PharmD, BCPS 06/26/2022 5:20 PM

## 2022-06-26 NOTE — Sepsis Progress Note (Signed)
Sepsis protocol is being followed by [facility]. 

## 2022-06-26 NOTE — Assessment & Plan Note (Signed)
Presumed in setting of worsening anemia as well as known esophageal and gastric varices. - Transfusing 1 unit packed RBCs - Trend CBC Q6 - Hold beta-blocker - Start Octreotide gtt - Hold p.o. PPI, start IV pantoprazole gtt

## 2022-06-26 NOTE — ED Notes (Signed)
Pt in CT.

## 2022-06-27 ENCOUNTER — Other Ambulatory Visit: Payer: Self-pay

## 2022-06-27 DIAGNOSIS — I10 Essential (primary) hypertension: Secondary | ICD-10-CM

## 2022-06-27 DIAGNOSIS — D62 Acute posthemorrhagic anemia: Secondary | ICD-10-CM

## 2022-06-27 LAB — CBC
HCT: 17.3 % — ABNORMAL LOW (ref 39.0–52.0)
HCT: 25 % — ABNORMAL LOW (ref 39.0–52.0)
HCT: 24.2 % — ABNORMAL LOW (ref 39.0–52.0)
Hemoglobin: 5.5 g/dL — ABNORMAL LOW (ref 13.0–17.0)
Hemoglobin: 8 g/dL — ABNORMAL LOW (ref 13.0–17.0)
Hemoglobin: 7.9 g/dL — ABNORMAL LOW (ref 13.0–17.0)
MCH: 30.6 pg (ref 26.0–34.0)
MCH: 30.8 pg (ref 26.0–34.0)
MCH: 31.6 pg (ref 26.0–34.0)
MCHC: 31.8 g/dL (ref 30.0–36.0)
MCHC: 32 g/dL (ref 30.0–36.0)
MCHC: 32.6 g/dL (ref 30.0–36.0)
MCV: 96.1 fL (ref 80.0–100.0)
MCV: 96.2 fL (ref 80.0–100.0)
MCV: 96.8 fL (ref 80.0–100.0)
Platelets: 100 10*3/uL — ABNORMAL LOW (ref 150–400)
Platelets: 81 10*3/uL — ABNORMAL LOW (ref 150–400)
Platelets: 78 10*3/uL — ABNORMAL LOW (ref 150–400)
RBC: 1.8 MIL/uL — ABNORMAL LOW (ref 4.22–5.81)
RBC: 2.6 MIL/uL — ABNORMAL LOW (ref 4.22–5.81)
RBC: 2.5 MIL/uL — ABNORMAL LOW (ref 4.22–5.81)
RDW: 22.5 % — ABNORMAL HIGH (ref 11.5–15.5)
RDW: 19.7 % — ABNORMAL HIGH (ref 11.5–15.5)
RDW: 20 % — ABNORMAL HIGH (ref 11.5–15.5)
WBC: 9.9 10*3/uL (ref 4.0–10.5)
WBC: 9.9 10*3/uL (ref 4.0–10.5)
WBC: 10.8 10*3/uL — ABNORMAL HIGH (ref 4.0–10.5)
nRBC: 0 % (ref 0.0–0.2)
nRBC: 0 % (ref 0.0–0.2)
nRBC: 0 % (ref 0.0–0.2)

## 2022-06-27 LAB — PREPARE RBC (CROSSMATCH)

## 2022-06-27 LAB — GLUCOSE, CAPILLARY
Glucose-Capillary: 61 mg/dL — ABNORMAL LOW (ref 70–99)
Glucose-Capillary: 95 mg/dL (ref 70–99)
Glucose-Capillary: 91 mg/dL (ref 70–99)

## 2022-06-27 LAB — TYPE AND SCREEN

## 2022-06-27 LAB — COMPREHENSIVE METABOLIC PANEL
ALT: 35 U/L (ref 0–44)
AST: 88 U/L — ABNORMAL HIGH (ref 15–41)
Albumin: 2.5 g/dL — ABNORMAL LOW (ref 3.5–5.0)
Alkaline Phosphatase: 92 U/L (ref 38–126)
Anion gap: 13 (ref 5–15)
BUN: 44 mg/dL — ABNORMAL HIGH (ref 8–23)
CO2: 22 mmol/L (ref 22–32)
Calcium: 7.4 mg/dL — ABNORMAL LOW (ref 8.9–10.3)
Chloride: 104 mmol/L (ref 98–111)
Creatinine, Ser: 1.4 mg/dL — ABNORMAL HIGH (ref 0.61–1.24)
GFR, Estimated: 56 mL/min — ABNORMAL LOW (ref >60)
Glucose, Bld: 110 mg/dL — ABNORMAL HIGH (ref 70–99)
Potassium: 4.4 mmol/L (ref 3.5–5.1)
Sodium: 139 mmol/L (ref 135–145)
Total Bilirubin: 2.7 mg/dL — ABNORMAL HIGH (ref 0.3–1.2)
Total Protein: 5.9 g/dL — ABNORMAL LOW (ref 6.5–8.1)

## 2022-06-27 LAB — CBG MONITORING, ED
Glucose-Capillary: 82 mg/dL (ref 70–99)
Glucose-Capillary: 72 mg/dL (ref 70–99)

## 2022-06-27 LAB — PROTIME-INR
INR: 1.7 — ABNORMAL HIGH (ref 0.8–1.2)
Prothrombin Time: 20.3 seconds — ABNORMAL HIGH (ref 11.4–15.2)

## 2022-06-27 LAB — BPAM RBC
Blood Product Expiration Date: 202407042359
ISSUE DATE / TIME: 202406021213

## 2022-06-27 LAB — MRSA NEXT GEN BY PCR, NASAL: MRSA by PCR Next Gen: DETECTED — AB

## 2022-06-27 MED ORDER — DEXTROSE 50 % IV SOLN
12.5000 g | Freq: Once | INTRAVENOUS | Status: AC
  Administered 2022-06-27: 12.5 g via INTRAVENOUS
  Filled 2022-06-27: qty 50

## 2022-06-27 MED ORDER — DEXTROSE 50 % IV SOLN: 12.5000 g | INTRAVENOUS | Status: AC

## 2022-06-27 MED ORDER — SODIUM CHLORIDE 0.9% IV SOLUTION
Freq: Once | INTRAVENOUS | Status: AC
  Filled 2022-06-27: qty 250

## 2022-06-27 MED ORDER — SODIUM CHLORIDE 0.9% FLUSH
10.0000 mL | Freq: Two times a day (BID) | INTRAVENOUS | Status: DC
  Administered 2022-06-27 – 2022-07-03 (×11): 10 mL

## 2022-06-27 MED ORDER — DIPHENHYDRAMINE HCL 50 MG/ML IJ SOLN
25.0000 mg | Freq: Once | INTRAMUSCULAR | Status: AC
  Administered 2022-06-27: 25 mg via INTRAVENOUS
  Filled 2022-06-27: qty 1

## 2022-06-27 MED ORDER — MORPHINE SULFATE (PF) 2 MG/ML IV SOLN
2.0000 mg | INTRAVENOUS | Status: DC | PRN
  Administered 2022-06-27 – 2022-06-28 (×4): 2 mg via INTRAVENOUS
  Filled 2022-06-27 (×4): qty 1

## 2022-06-27 MED ORDER — LACTULOSE ENEMA
300.0000 mL | Freq: Four times a day (QID) | ORAL | Status: DC
  Administered 2022-06-27 – 2022-06-28 (×2): 300 mL via RECTAL
  Filled 2022-06-27 (×4): qty 300

## 2022-06-27 MED ORDER — CHLORHEXIDINE GLUCONATE CLOTH 2 % EX PADS
6.0000 | MEDICATED_PAD | Freq: Every day | CUTANEOUS | Status: DC
  Administered 2022-06-28 – 2022-07-03 (×6): 6 via TOPICAL

## 2022-06-27 MED ORDER — HALOPERIDOL LACTATE 5 MG/ML IJ SOLN
5.0000 mg | Freq: Once | INTRAMUSCULAR | Status: AC
  Administered 2022-06-27: 5 mg via INTRAVENOUS
  Filled 2022-06-27: qty 1

## 2022-06-27 MED ORDER — SODIUM CHLORIDE 0.9% FLUSH: 10.0000 mL | INTRAVENOUS | Status: DC | PRN

## 2022-06-27 NOTE — ED Notes (Signed)
Patient noted to be flailing all over the bed and continuously hanging onto side rails and moving to bottom of bed. Patient adjusted and moved up in bed multiple times. Bed alarm placed.   Pleasantly confused but alert to self and place.

## 2022-06-27 NOTE — ED Notes (Signed)
Right AC IV noted to be infiltrated. IV removed at this time. Pharmacy notified and reported nothing in protocol to do at this time

## 2022-06-27 NOTE — ED Notes (Signed)
Upon arrival to shift, during assessment of patient, Left arm IV noted to be infiltrated. Medication stopped and IV taken out. MD Marcelino Duster notified.

## 2022-06-27 NOTE — Consult Note (Signed)
GI Inpatient Consult Note  Reason for Consult: GI bleed   Attending Requesting Consult: Dr. Marcelino Duster, MD  History of Present Illness: Barry [name] is a 67 y.o. male seen for evaluation of GI bleed at the request of hospitalist - Dr. Marcelino Duster. Patient has a PMH of decompensated hepatic cirrhosis c/b abdominal ascites and EV, recently diagnosed acute HBV on anti-viral therapy, morbid obesity (BMI 40), HTN, HLD, OA, hx of CVA, T2DM, CAD s/p CABG x3 s/p hx of MI with stent placement 03/2007, OSA, depression, anxiety, CKD Stage III. He presented to the [facility] ED via EMS from SNF 6/1 for chief complaint of altered mental status. Upon presentation to the ED, vital signs notable for tachycardia in low 100s, mild tachypnea in 20s, and soft BP. Labs notable for hemoglobin 6.0 (8.5 three weeks ago), WBC 17.2K, platelets 139K, BUN 43, serum creatinine 1.47, albumin 2.0, AST 110, total bilirubin 2.6, INR 1.8, and ammonia 92. Chest x-ray with no acute findings. He was given 1.5L NS bolus, IV thiamine, Ceftriaxone, Flagyl, and ordered to have 1 unit pRBCs. GI consulted for further evaluation and management.    Patient seen and examined this morning in ED room. No family present at bedside. He is arouseable but does not answer any questions appropriately and mutters incoherently. ED nurses in room report he had large melanotic BM this morning. Hemoglobin 5.5 this morning on labs and orders in for 2 units pRBCs. No reports of hematemesis or vomiting. He was recently admitted at [facility] last month for altered mental status 5/5 - 5/11. He had trough hemoglobin 4.4 and GI consulted and he is s/p EGD with Dr. Claudette Head on 5/7 which showed non-bleeding Grade II esophageal varices with no stigmata of recent bleeding and moderate PHG with active oozing in antrum. IR consult was done for consideration of TIPS but was not recommended. He was also found to have acute HBV and was started on tenofovir.    Summary of GI Procedures:  EGD 06/01/2022 - non-bleeding Grade II esophageal varices seen in lower esophagus, moderate PHG with active diffuse oozing in antrum, normal duodenum   EGD 06/17/2021 - Grade I varices found in middle and lower third of esophagus with no signs of bleeding, no evidence of esophageal stricture, moderate portal hypertensive gastropathy found in entire examined stomach (not biopsied due to low platelet count), normal duodenum  CSY 06/17/2021 - normal appearing terminal ileum, one 3 mm polyp removed from descending colon with oozing s/p three clips placed, two 2-3 mm polyps removed from sigmoid colon with oozing s/p two clips placed, two 2-3 mm polyps removed from rectum, small non-bleeding IH   Last Colonoscopy:  Last Endoscopy:    Past Medical History:  Past Medical History:  Diagnosis Date   Anemia    Anxiety    Arthritis    Cerebrovascular disease    Cervical disc disorder    Chronic kidney disease    Cirrhosis of liver (HCC)    Coronary artery disease    Depression    Dyspnea    with exertion   Esophageal varices (HCC)    hx of   Family history of colon cancer 10/15/2019   GERD (gastroesophageal reflux disease)    Headache    Hyperlipidemia    Hyperlipidemia    Hypocalcemia    MI (myocardial infarction) (HCC) 04/23/2007   inferior wall   Morbid obesity (HCC) 06/26/2012   Neuropathy    secondary to diabetes  Pancreatitis    hs of   S/P CABG x 3 07/04/2012   LIMA to LAD, SVG to D1, SVG to PDA, EVH via right thigh   Sarcoidosis of skin    Sleep apnea    no cpap   Stroke (HCC)    small stroke - 2023 - legs weak , righ tleg weak   Type II or unspecified type diabetes mellitus without mention of complication, not stated as uncontrolled    Unspecified essential hypertension     Problem List: Patient Active Problem List   Diagnosis Date Noted   Hepatic encephalopathy (HCC) 06/26/2022   Severe sepsis (HCC) 06/26/2022   SBP (spontaneous  bacterial peritonitis) (HCC) 06/26/2022   Upper GI bleed 06/26/2022   Known medical problems 06/26/2022   Acute hepatitis B 06/26/2022   Hyperbilirubinemia 06/26/2022   Portal hypertensive gastropathy (HCC) 06/03/2022   Occult blood in stools 06/01/2022   Symptomatic anemia 05/30/2022   Sleep apnea in adult 04/24/2022   Right hip pain 04/21/2022   Hypertension 04/21/2022   OSA (obstructive sleep apnea) 04/21/2022   AKI (acute kidney injury) (HCC) 04/19/2022   Hypotension 04/18/2022   Acute metabolic encephalopathy 04/18/2022   Manifestations of thiamine deficiency 03/06/2022   Rash and nonspecific skin eruption 03/02/2022   Cirrhosis of liver with ascites (HCC) 03/01/2022   Deficiency anemia 03/01/2022   Chronic seborrheic dermatitis 03/01/2022   Genetic testing 10/30/2019   Family history of colon cancer 10/15/2019   IDA (iron deficiency anemia) 07/14/2018   GERD (gastroesophageal reflux disease) 11/03/2017   Thrombocytopenia (HCC) 08/26/2017   Chronic pain of both shoulders 05/18/2017   Abnormal nuclear stress test    Microalbuminuria due to type 2 diabetes mellitus (HCC) 02/16/2017   Type 2 diabetes mellitus with diabetic neuropathy, with long-term current use of insulin (HCC) 06/20/2015   Chest pain, atypical 03/11/2015   Hyperlipidemia LDL goal <70 03/26/2014   Knee osteoarthritis 09/18/2013   Lumbosacral radiculopathy 09/04/2013   Insomnia 08/14/2013   Essential hypertension, benign 05/15/2013   Erectile dysfunction associated with type 2 diabetes mellitus (HCC) 01/09/2013   Depression with anxiety 09/05/2012   CAD (coronary artery disease) 06/26/2012   Morbid obesity due to excess calories (HCC) 06/26/2012   Obstructive sleep apnea     Past Surgical History: Past Surgical History:  Procedure Laterality Date   CORONARY ANGIOPLASTY WITH STENT PLACEMENT  04/23/2007   PCI and stenting of mid RCA - Dr Bary Castilla @ [facility]   CORONARY ARTERY BYPASS GRAFT N/A 07/04/2012    Procedure: CORONARY ARTERY BYPASS GRAFTING (CABG);  Surgeon: [name], MD;  Location: [facility] OR;  Service: Open Heart Surgery;  Laterality: N/A;  x3 using right greater saphenous vein and left internal mammary.    ESOPHAGOGASTRODUODENOSCOPY (EGD) WITH PROPOFOL N/A 06/01/2022   Procedure: ESOPHAGOGASTRODUODENOSCOPY (EGD) WITH PROPOFOL;  Surgeon: Meryl Dare, MD;  Location: [facility] ENDOSCOPY;  Service: Gastroenterology;  Laterality: N/A;   INTRAOPERATIVE TRANSESOPHAGEAL ECHOCARDIOGRAM N/A 07/04/2012   Procedure: INTRAOPERATIVE TRANSESOPHAGEAL ECHOCARDIOGRAM;  Surgeon: [name], MD;  Location: [facility] OR;  Service: Open Heart Surgery;  Laterality: N/A;   LEFT HEART CATH AND CORS/GRAFTS ANGIOGRAPHY N/A 04/18/2017   Procedure: LEFT HEART CATH AND CORS/GRAFTS ANGIOGRAPHY;  Surgeon: [name], MD;  Location: [facility] INVASIVE CV LAB;  Service: Cardiovascular;  Laterality: N/A;   LEFT HEART CATHETERIZATION WITH CORONARY ANGIOGRAM N/A 06/25/2012   Procedure: LEFT HEART CATHETERIZATION WITH CORONARY ANGIOGRAM;  Surgeon: [name], MD;  Location: [facility] CATH LAB;  Service:  Cardiovascular;  Laterality: N/A;   TOOTH EXTRACTION  04/2018   4 teeth pulled     Allergies: No Known Allergies  Home Medications: (Not in a hospital admission)  Home medication reconciliation was completed with the patient.   Scheduled Inpatient Medications:    sodium chloride   Intravenous Once   DULoxetine  20 mg Oral Daily   fluticasone furoate-vilanterol  1 puff Inhalation Daily   insulin aspart  0-6 Units Subcutaneous TID WC   lactulose  10 g Oral TID   [START ON 06/30/2022] pantoprazole  40 mg Intravenous Q12H   sertraline  200 mg Oral Daily   sodium chloride flush  3 mL Intravenous Q12H   tenofovir  300 mg Oral Daily   thiamine  100 mg Oral Daily    Continuous Inpatient Infusions:    sodium chloride Stopped (06/27/22 0033)   albumin human 100 g (06/27/22 0411)   cefTRIAXone (ROCEPHIN)  IV     lactated ringers  150 mL/hr at 06/27/22 0035   metronidazole     octreotide (SANDOSTATIN) 500 mcg in sodium chloride 0.9 % 250 mL (2 mcg/mL) infusion 50 mcg/hr (06/27/22 0753)   pantoprazole 8 mg/hr (06/27/22 0119)    PRN Inpatient Medications:  acetaminophen, ipratropium-albuterol, ondansetron **OR** ondansetron (ZOFRAN) IV, oxyCODONE  Family History: family history includes Cancer in his father, maternal aunt, mother, and sister; Colon cancer in his brother and brother; Diabetes in his brother and sister.  The patient's family history is negative for inflammatory bowel disorders, GI malignancy, or solid organ transplantation.  Social History:   reports that he has never smoked. He has never used smokeless tobacco. He reports that he does not drink alcohol and does not use drugs. The patient denies ETOH, tobacco, or drug use.   Review of Systems:  Unable to obtain 2/2 encephalopathy    Physical Examination: BP 112/65   Pulse (!) 101   Temp 97.7 F (36.5 C) (Oral)   Resp 16   Ht 5\' 4"  (1.626 m)   Wt 107.7 kg   SpO2 97%   BMI 40.75 kg/m  Gen: NAD, alert to person and place, arouseable but does not answer questions, ill-appearing HEENT: PEERLA, EOMI, ecchymosis around left eye Neck: supple, no JVD or thyromegaly Chest: CTA bilaterally, no wheezes, crackles, or other adventitious sounds CV: RRR, no m/g/c/r Abd: soft, distended, +BS in all four quadrants; no HSM, guarding, ridigity, or rebound tenderness Ext: no edema, well perfused with 2+ pulses, Skin: no rash or lesions noted Lymph: no LAD  Data: Lab Results  Component Value Date   WBC 9.9 06/27/2022   HGB 5.5 (L) 06/27/2022   HCT 17.3 (L) 06/27/2022   MCV 96.1 06/27/2022   PLT 100 (L) 06/27/2022   Recent Labs  Lab 06/26/22 1819 06/27/22 0550  HGB 6.0* 5.5*   Lab Results  Component Value Date   NA 139 06/27/2022   K 4.4 06/27/2022   CL 104 06/27/2022   CO2 22 06/27/2022   BUN 44 (H) 06/27/2022   CREATININE 1.40 (H)  06/27/2022   Lab Results  Component Value Date   ALT 35 06/27/2022   AST 88 (H) 06/27/2022   ALKPHOS 92 06/27/2022   BILITOT 2.7 (H) 06/27/2022   Recent Labs  Lab 06/26/22 1721  APTT 22*  INR 1.8*    Assessment/Plan:  67 y/o Caucasian male with a PMH of decompensated hepatic cirrhosis c/b abdominal ascites and EV, morbid obesity (BMI 40), HTN, HLD, OA, hx of CVA,  T2DM, CAD s/p CABG x3 s/p hx of MI with stent placement 03/2007, OSA, depression, anxiety, CKD Stage III presented to the [facility] ED via EMS from SNF 6/1 for chief complaint of altered mental status. GI consulted for further evaluation and management of acute blood loss anemia.   Acute blood loss anemia suspected 2/2 GI bleed - Hemoglobin 5.5 this morning. Differential includes PHG, GAVE, esophageal varices, PUD, erosive esophagitis, AVMs, polyp, gastritis, Dieulafoy's lesion, etc.   Decompensated cirrhosis of the liver c/b ascites, EV, and HE - MELD 3.0 21  Hepatic encephalopathy  Acute HBV - recently diagnosed last month during hospitalization, on tenofovir   Severe sepsis - on antibiotics per protocol  AKI  T2DM  CAD  Morbid obesity   Recommendations:  - Maintain 2 large bore IVs for access - Transfuse 2 units pRBCs as ordered. Check hemoglobin 2 hours after.  - Continue to monitor serial H&H. Transfuse for Hgb <7.0.  - Continue Octreotide gtt, Protonix gtt, and Ceftriaxone 2 grams IV q24hr - Continue to monitor for signs of active GI bleeding - Lactulose enemas. Continue to monitor mental status closely. Titrate to goal 3-4 loose bowel movements daily.  - Hold antiplatelet/anticoagulation - Avoid hepatotoxins  - Continue management of comorbidities per primary team  - EGD when clinically feasible, ideally after hemodynamic and mental status improves - If there are signs of active GI bleeding, please call Dr. Norma Fredrickson - NPO - GI following along closely with you   Thank you for the consult. Please call with  questions or concerns.  Gilda Crease, PA-C [facility] Gastroenterology [phone]

## 2022-06-27 NOTE — ED Notes (Signed)
Patients brief changed. Incontinent of urine

## 2022-06-27 NOTE — Progress Notes (Signed)
Peripherally Inserted Central Catheter Placement  The IV Nurse has discussed with the patient and/or persons authorized to consent for the patient, the purpose of this procedure and the potential benefits and risks involved with this procedure.  The benefits include less needle sticks, lab draws from the catheter, and the patient may be discharged home with the catheter. Risks include, but not limited to, infection, bleeding, blood clot (thrombus formation), and puncture of an artery; nerve damage and irregular heartbeat and possibility to perform a PICC exchange if needed/ordered by physician.  Alternatives to this procedure were also discussed.  Bard Power PICC patient education guide, fact sheet on infection prevention and patient information card has been provided to patient /or left at bedside.  Unable to reach family by phone. Per Dr. Clide Dales, PICC placed per medical necessity.   PICC Placement Documentation  PICC Triple Lumen 06/27/22 Right Basilic 37 cm 1 cm (Active)  Indication for Insertion or Continuance of Line Limited venous access - need for IV therapy >5 days (PICC only) 06/27/22 1842  Exposed Catheter (cm) 1 cm 06/27/22 1842  Site Assessment Clean, Dry, Intact 06/27/22 1842  Lumen #1 Status Saline locked;Flushed;Blood return noted 06/27/22 1842  Lumen #2 Status Saline locked;Flushed;Blood return noted 06/27/22 1842  Lumen #3 Status Saline locked;Flushed;Blood return noted 06/27/22 1842  Dressing Type Transparent;Securing device 06/27/22 1842  Dressing Status Antimicrobial disc in place;Clean, Dry, Intact 06/27/22 1842  Safety Lock Not Applicable 06/27/22 1842  Line Care Connections checked and tightened 06/27/22 1842  Line Adjustment (NICU/IV Team Only) No 06/27/22 1842  Dressing Intervention New dressing 06/27/22 1842  Dressing Change Due 07/04/22 06/27/22 1842       Burnard Bunting Chenice 06/27/2022, 6:43 PM

## 2022-06-27 NOTE — ED Notes (Signed)
Waiting on urology cart for supplies to give enema

## 2022-06-27 NOTE — ED Notes (Signed)
Second message to pharmacy for lactulose still not received

## 2022-06-27 NOTE — ED Notes (Signed)
Messaged pharmacy for lactulose

## 2022-06-27 NOTE — Progress Notes (Signed)
Progress Note   Patient: Barry [name] DOB: 07-21-1955 DOA: 06/26/2022     1 DOS: the patient was seen and examined on 06/27/2022   Brief hospital course: Barry [name] is a 67 y.o. male with medical history significant for decompensated cirrhosis with ascites and varices, CVA, type 2 diabetes, CAD, depression/anxiety, CKD stage III, who presents from SNF with altered mental status. Hb noted to be low, got 1 unit PRBC. Ammonia high admitted for further management and evaluation. Recent admission [name] for similar presentation when he had EGD showed grade 2 varices without active bleeding as well as portal hypertensive gastropathy with active oozing in the antrum, also diagnosed with hep B started tenofovir.  Hb this AM 5.5 post transfusion. GI has been consulted.  Assessment and Plan: Upper GI bleed Acute blood loss anemia - Known esophageal and gastric varices from recent EGD. Hb 5.5 s/p 1 unit PRBC. 2 more units of blood transfusion ordered. NPO.  Continue to trend CBC Q6 Continue Octreotide gtt, pantoprazole gtt, Rocephin tx. Hold Aspirin or Heparin for DVT prophylaxis. GI evaluation appreciated. EGD once hemodynamically stable.  * Metabolic encephalopathy (HCC)- Multifactorial in setting of sepsis, SBP, liver cirrhosis. Ammonia high - Lactulose ordered per rectum. He is lethargic, obtunded. Continue neuro checks. Monitor daily electrolytes, renal function. NPO for now including meds as he is lethargic.  SBP (spontaneous bacterial peritonitis) (HCC)- Presumed diagnosis in setting of likely GI bleed, sepsis physiology. Continue ceftriaxone and Flagyl Weight-based albumin today and on day 3. Will get IR involved for paracentesis once he is clinically stable.  Severe sepsis (HCC) He has elevated white count, tachycardia, and high lactate suspected SBP.  Follow blood cultures. Paracentesis once more stable. Continue ceftriaxone and Flagyl  AKI (acute kidney injury)  (HCC) Hypovolemia in setting of severe sepsis/SBP versus HRS.  Did receive IV fluids in ED. Hold for now Continue albumin infusion as ordered. Hold nephrotoxic drugs. Daily CMP.  Hyperbilirubinemia Elevated LFT- known cirrhotic and Hep B patient. Trend LFT.  Continue Tenofovir once more alert.  Asthma- Continue duonebs needed  Type 2 diabetes - very sensitive sliding scale insulin correction.   Hold metformin in setting of AKI.  Hyperlipidemia- Hold atorvastatin in setting of transaminitis  Hypertension- Beta blocker on hold due to upper GI bleed.   DVT prophylaxis - SCD. Nursing supportive care, Fall, aspiration, seizure precautions.      Subjective: Patient is seen and examined today morning. He is sleepy and lethargic. Unable to provide any history. RN notified that he had large dark stool. Hb 5.5, 2 unit transfusion ordered. No family at bedside. GI saw him this morning.   Physical Exam: Vitals:   06/27/22 0551 06/27/22 0700 06/27/22 0846 06/27/22 0920  BP:  112/65 104/88 (!) 131/53  Pulse:  (!) 101 91 95  Resp:  16 20 15   Temp: 97.7 F (36.5 C)  97.6 F (36.4 C) 97.6 F (36.4 C)  TempSrc: Oral  Oral Axillary  SpO2:  97% 100% 100%  Weight:      Height:       General- Elderly obese ill Caucasian male, lethargic, sleepy. HEENT- left eye bruise noted. Data Reviewed:  CBC, BMP, LFT, ammonia, lactic acid  Family Communication: Discussed with patient's ex-wife over phone. She understands and agrees.  Disposition: Status is: Inpatient Remains inpatient appropriate because: sick, altered, need blood transfusion, GI work up  Planned Discharge Destination: Skilled nursing facility    MDM level 3- Patient is altered, has  liver cirrhosis with complications, GI bleed, blood loss anemia need close hemodynamic, neurological, telemetry monitoring. He is at high risk for clinical deterioration.  Author: Marcelino Duster, MD 06/27/2022 11:39 AM  For on call  review [email or web address].

## 2022-06-27 NOTE — ED Notes (Signed)
Pt moved to hospital bed.

## 2022-06-27 NOTE — Plan of Care (Addendum)
Discussed in report in front of patient plan of care for the shift,  pain management and reorienting patient to place an time with no evidence of learning at this time.  Talked to ex-wife Chales Abrahams at [phone].  She says he has fallen twice prior.  He does use an walker and cane.  He does wear glasses and has a right glass eye.  Talked to his daughter Ingvald Bayona at [phone] who lives in [place], [place] which is a three hour time delay so 7 am here is 4 am where she lives.   So, please give her a name and a good number to call you back at if you leave a message.  She is moving from [place] to [place] in the next week. She is concerned if she needs to come here to [place] if this is more serious than a general hospital visit.  She wants the Primary MD to call her back tomorrow to discuss.  Dad lived with her from 06/2020 to 09/2021.  After which he moved in with his friend [name] prior to last visit to [facility] before being transferred to a SNF in [place], [place].  She is aware of his Acute Hepatitis B diagnosis and Esophogeal Varices.   Problem: Education: Goal: Knowledge of General Education information will improve Description: Including pain rating scale, medication(s)/side effects and non-pharmacologic comfort measures Outcome: Not Progressing   Problem: Health Behavior/Discharge Planning: Goal: Ability to manage health-related needs will improve Outcome: Not Progressing   Problem: Safety: Goal: Ability to remain free from injury will improve Outcome: Not Progressing   Problem: Pain Managment: Goal: General experience of comfort will improve Outcome: Not Progressing

## 2022-06-27 NOTE — Progress Notes (Signed)
CBG upon arrival to ICU was 61. 12.5 g of d50 given per protocol. CBG rechecked post treatment and is 95.

## 2022-06-28 ENCOUNTER — Inpatient Hospital Stay: Payer: Medicare HMO

## 2022-06-28 ENCOUNTER — Other Ambulatory Visit: Payer: Self-pay

## 2022-06-28 DIAGNOSIS — R4182 Altered mental status, unspecified: Secondary | ICD-10-CM

## 2022-06-28 DIAGNOSIS — K7031 Alcoholic cirrhosis of liver with ascites: Secondary | ICD-10-CM

## 2022-06-28 LAB — HEMOGLOBIN AND HEMATOCRIT, BLOOD
HCT: 19.5 % — ABNORMAL LOW (ref 39.0–52.0)
Hemoglobin: 6.3 g/dL — ABNORMAL LOW (ref 13.0–17.0)

## 2022-06-28 LAB — BODY FLUID CELL COUNT WITH DIFFERENTIAL
Lymphs, Fluid: 7 %
Monocyte-Macrophage-Serous Fluid: 54 %
Neutrophil Count, Fluid: 39 %
Total Nucleated Cell Count, Fluid: 242 cu mm

## 2022-06-28 LAB — ALBUMIN, PLEURAL OR PERITONEAL FLUID: Albumin, Fluid: <1.5 g/dL

## 2022-06-28 LAB — TYPE AND SCREEN: Unit division: 0

## 2022-06-28 LAB — HEPATIC FUNCTION PANEL
ALT: 38 U/L (ref 0–44)
AST: 97 U/L — ABNORMAL HIGH (ref 15–41)
Albumin: 2.3 g/dL — ABNORMAL LOW (ref 3.5–5.0)
Alkaline Phosphatase: 91 U/L (ref 38–126)
Bilirubin, Direct: 0.8 mg/dL — ABNORMAL HIGH (ref 0.0–0.2)
Indirect Bilirubin: 2 mg/dL — ABNORMAL HIGH (ref 0.3–0.9)
Total Bilirubin: 2.8 mg/dL — ABNORMAL HIGH (ref 0.3–1.2)
Total Protein: 5.4 g/dL — ABNORMAL LOW (ref 6.5–8.1)

## 2022-06-28 LAB — BASIC METABOLIC PANEL
Anion gap: 15 (ref 5–15)
BUN: 40 mg/dL — ABNORMAL HIGH (ref 8–23)
CO2: 21 mmol/L — ABNORMAL LOW (ref 22–32)
Calcium: 7.4 mg/dL — ABNORMAL LOW (ref 8.9–10.3)
Chloride: 109 mmol/L (ref 98–111)
Creatinine, Ser: 1.35 mg/dL — ABNORMAL HIGH (ref 0.61–1.24)
GFR, Estimated: 58 mL/min — ABNORMAL LOW (ref >60)
Glucose, Bld: 91 mg/dL (ref 70–99)
Potassium: 3.4 mmol/L — ABNORMAL LOW (ref 3.5–5.1)
Sodium: 145 mmol/L (ref 135–145)

## 2022-06-28 LAB — CULTURE, BLOOD (ROUTINE X 2)
Culture: NO GROWTH
Special Requests: ADEQUATE

## 2022-06-28 LAB — BPAM RBC: Blood Product Expiration Date: 202406282359

## 2022-06-28 LAB — CBC
HCT: 22.7 % — ABNORMAL LOW (ref 39.0–52.0)
Hemoglobin: 7.4 g/dL — ABNORMAL LOW (ref 13.0–17.0)
MCH: 31.4 pg (ref 26.0–34.0)
MCHC: 32.6 g/dL (ref 30.0–36.0)
MCV: 96.2 fL (ref 80.0–100.0)
Platelets: 62 10*3/uL — ABNORMAL LOW (ref 150–400)
RBC: 2.36 MIL/uL — ABNORMAL LOW (ref 4.22–5.81)
RDW: 20.2 % — ABNORMAL HIGH (ref 11.5–15.5)
WBC: 10.5 10*3/uL (ref 4.0–10.5)
nRBC: 0 % (ref 0.0–0.2)

## 2022-06-28 LAB — PROTEIN, PLEURAL OR PERITONEAL FLUID: Total protein, fluid: <3 g/dL

## 2022-06-28 LAB — GLUCOSE, CAPILLARY
Glucose-Capillary: 109 mg/dL — ABNORMAL HIGH (ref 70–99)
Glucose-Capillary: 118 mg/dL — ABNORMAL HIGH (ref 70–99)
Glucose-Capillary: 130 mg/dL — ABNORMAL HIGH (ref 70–99)
Glucose-Capillary: 77 mg/dL (ref 70–99)
Glucose-Capillary: 79 mg/dL (ref 70–99)
Glucose-Capillary: 83 mg/dL (ref 70–99)

## 2022-06-28 LAB — PREPARE RBC (CROSSMATCH)

## 2022-06-28 LAB — GLUCOSE, PLEURAL OR PERITONEAL FLUID: Glucose, Fluid: 98 mg/dL

## 2022-06-28 LAB — LACTATE DEHYDROGENASE, PLEURAL OR PERITONEAL FLUID: LD, Fluid: 40 U/L — ABNORMAL HIGH (ref 3–23)

## 2022-06-28 LAB — AMYLASE, PLEURAL OR PERITONEAL FLUID: Amylase, Fluid: 7 U/L

## 2022-06-28 LAB — AMMONIA: Ammonia: 21 umol/L (ref 9–35)

## 2022-06-28 MED ORDER — LORAZEPAM 2 MG/ML IJ SOLN
0.5000 mg | INTRAMUSCULAR | Status: DC | PRN
  Administered 2022-06-28 – 2022-06-29 (×4): 0.5 mg via INTRAVENOUS
  Filled 2022-06-28 (×4): qty 1

## 2022-06-28 MED ORDER — THIAMINE MONONITRATE 100 MG PO TABS: 100.0000 mg | ORAL_TABLET | Freq: Every day | ORAL | Status: DC

## 2022-06-28 MED ORDER — POTASSIUM CHLORIDE 10 MEQ/100ML IV SOLN
10.0000 meq | INTRAVENOUS | Status: AC
  Administered 2022-06-28 (×3): 10 meq via INTRAVENOUS
  Filled 2022-06-28 (×3): qty 100

## 2022-06-28 MED ORDER — LIDOCAINE HCL (PF) 1 % IJ SOLN
7.0000 mL | Freq: Once | INTRAMUSCULAR | Status: AC
  Administered 2022-06-28: 7 mL via INTRADERMAL
  Filled 2022-06-28: qty 8

## 2022-06-28 MED ORDER — DEXTROSE 5 % IV SOLN: INTRAVENOUS | Status: DC

## 2022-06-28 MED ORDER — THIAMINE HCL 100 MG/ML IJ SOLN
100.0000 mg | Freq: Every day | INTRAMUSCULAR | Status: DC
  Administered 2022-06-28: 100 mg via INTRAVENOUS
  Filled 2022-06-28: qty 2

## 2022-06-28 MED ORDER — LORAZEPAM 2 MG/ML PO CONC: 0.5000 mg | ORAL | Status: DC | PRN

## 2022-06-28 MED ORDER — MUPIROCIN 2 % EX OINT
1.0000 | TOPICAL_OINTMENT | Freq: Two times a day (BID) | CUTANEOUS | Status: AC
  Administered 2022-06-28 – 2022-07-02 (×10): 1 via NASAL
  Filled 2022-06-28: qty 22

## 2022-06-28 MED ORDER — LACTULOSE ENEMA
300.0000 mL | Freq: Four times a day (QID) | ORAL | Status: DC
  Administered 2022-06-28 – 2022-07-03 (×19): 300 mL via RECTAL
  Filled 2022-06-28 (×25): qty 300

## 2022-06-28 MED ORDER — SODIUM CHLORIDE 0.9% IV SOLUTION: Freq: Once | INTRAVENOUS | Status: DC

## 2022-06-28 NOTE — Progress Notes (Signed)
[facility] Gastroenterology Inpatient Progress Note    Subjective: Patient seen for f/u GI bleed, hepatic encephalopathy. Patient still confused, moving uncomfortably in bed. Does not verbalize complaints. Per RN, patient received a lactulose enema yesterday.and around 1 am this morning. No significant bleeding overnight per RN.  Objective: Vital signs in last 24 hours: Temp:  [97.6 F (36.4 C)-99.1 F (37.3 C)] 98.4 F (36.9 C) (06/03 0300) Pulse Rate:  [73-113] 95 (06/03 0600) Resp:  [15-31] 18 (06/03 0600) BP: (91-138)/(47-111) 110/56 (06/03 0600) SpO2:  [91 %-100 %] 93 % (06/03 0600) Weight:  [104.7 kg] 104.7 kg (06/03 0141) Blood pressure (!) 110/56, pulse 95, temperature 98.4 F (36.9 C), temperature source Axillary, resp. rate 18, height 5\' 4"  (1.626 m), weight 104.7 kg, SpO2 93 %.    Intake/Output from previous day: 06/02 0701 - 06/03 0700 In: 2674.6 [I.V.:1146.2; Blood:330; IV Piggyback:1198.4] Out: 600 [Urine:600]  Intake/Output this shift: No intake/output data recorded.   Gen: NAD. Appears uncomfortable.  HEENT: [place]/AT. PERRLA. Normal external ear exam.Right eye ecchymoses.  Chest: Few crackles, no wheezes.  CV: RR nl S1, S2. No gallops.  Abd: soft, distended nt, nd. BS+  Ext: 1+edema. Pulses 2+  Neuro: Confused, slightly agitated. nonconversive.   Lab Results: Results for orders placed or performed during the hospital encounter of 06/26/22 (from the past 24 hour(s))  CBG monitoring, ED     Status: None   Collection Time: 06/27/22  8:13 AM  Result Value Ref Range   Glucose-Capillary 82 70 - 99 mg/dL  CBG monitoring, ED     Status: None   Collection Time: 06/27/22 11:42 AM  Result Value Ref Range   Glucose-Capillary 72 70 - 99 mg/dL  CBC     Status: Abnormal   Collection Time: 06/27/22  3:13 PM  Result Value Ref Range   WBC 9.9 4.0 - 10.5 K/uL   RBC 2.60 (L) 4.22 - 5.81 MIL/uL   Hemoglobin 8.0 (L) 13.0 - 17.0 g/dL   HCT 40.9 (L) 81.1 - 91.4  %   MCV 96.2 80.0 - 100.0 fL   MCH 30.8 26.0 - 34.0 pg   MCHC 32.0 30.0 - 36.0 g/dL   RDW 78.2 (H) 95.6 - 21.3 %   Platelets 81 (L) 150 - 400 K/uL   nRBC 0.0 0.0 - 0.2 %  Protime-INR     Status: Abnormal   Collection Time: 06/27/22  3:13 PM  Result Value Ref Range   Prothrombin Time 20.3 (H) 11.4 - 15.2 seconds   INR 1.7 (H) 0.8 - 1.2  Glucose, capillary     Status: Abnormal   Collection Time: 06/27/22  4:52 PM  Result Value Ref Range   Glucose-Capillary 61 (L) 70 - 99 mg/dL  MRSA Next Gen by PCR, Nasal     Status: Abnormal   Collection Time: 06/27/22  5:03 PM   Specimen: Nasal Mucosa; Nasal Swab  Result Value Ref Range   MRSA by PCR Next Gen DETECTED (A) NOT DETECTED  Glucose, capillary     Status: None   Collection Time: 06/27/22  5:23 PM  Result Value Ref Range   Glucose-Capillary 95 70 - 99 mg/dL  CBC     Status: Abnormal   Collection Time: 06/27/22  6:20 PM  Result Value Ref Range   WBC 10.8 (H) 4.0 - 10.5 K/uL   RBC 2.50 (L) 4.22 - 5.81 MIL/uL   Hemoglobin 7.9 (L) 13.0 - 17.0 g/dL   HCT 08.6 (L) 57.8 - 46.9 %  MCV 96.8 80.0 - 100.0 fL   MCH 31.6 26.0 - 34.0 pg   MCHC 32.6 30.0 - 36.0 g/dL   RDW 82.9 (H) 56.2 - 13.0 %   Platelets 78 (L) 150 - 400 K/uL   nRBC 0.0 0.0 - 0.2 %  Glucose, capillary     Status: None   Collection Time: 06/27/22  7:39 PM  Result Value Ref Range   Glucose-Capillary 91 70 - 99 mg/dL  Basic metabolic panel     Status: Abnormal   Collection Time: 06/28/22  3:37 AM  Result Value Ref Range   Sodium 145 135 - 145 mmol/L   Potassium 3.4 (L) 3.5 - 5.1 mmol/L   Chloride 109 98 - 111 mmol/L   CO2 21 (L) 22 - 32 mmol/L   Glucose, Bld 91 70 - 99 mg/dL   BUN 40 (H) 8 - 23 mg/dL   Creatinine, Ser 8.65 (H) 0.61 - 1.24 mg/dL   Calcium 7.4 (L) 8.9 - 10.3 mg/dL   GFR, Estimated 58 (L) >60 mL/min   Anion gap 15 5 - 15  CBC     Status: Abnormal   Collection Time: 06/28/22  3:37 AM  Result Value Ref Range   WBC 10.5 4.0 - 10.5 K/uL   RBC 2.36 (L)  4.22 - 5.81 MIL/uL   Hemoglobin 7.4 (L) 13.0 - 17.0 g/dL   HCT 78.4 (L) 69.6 - 29.5 %   MCV 96.2 80.0 - 100.0 fL   MCH 31.4 26.0 - 34.0 pg   MCHC 32.6 30.0 - 36.0 g/dL   RDW 28.4 (H) 13.2 - 44.0 %   Platelets 62 (L) 150 - 400 K/uL   nRBC 0.0 0.0 - 0.2 %  Ammonia     Status: None   Collection Time: 06/28/22  3:37 AM  Result Value Ref Range   Ammonia 21 9 - 35 umol/L  Glucose, capillary     Status: None   Collection Time: 06/28/22  3:39 AM  Result Value Ref Range   Glucose-Capillary 83 70 - 99 mg/dL  Glucose, capillary     Status: None   Collection Time: 06/28/22  7:33 AM  Result Value Ref Range   Glucose-Capillary 77 70 - 99 mg/dL     Recent Labs    11/21/23 1513 06/27/22 1820 06/28/22 0337  WBC 9.9 10.8* 10.5  HGB 8.0* 7.9* 7.4*  HCT 25.0* 24.2* 22.7*  PLT 81* 78* 62*   BMET Recent Labs    06/26/22 1721 06/27/22 0550 06/28/22 0337  NA 135 139 145  K 5.1 4.4 3.4*  CL 100 104 109  CO2 22 22 21*  GLUCOSE 131* 110* 91  BUN 43* 44* 40*  CREATININE 1.47* 1.40* 1.35*  CALCIUM 7.8* 7.4* 7.4*   LFT Recent Labs    06/27/22 0550  PROT 5.9*  ALBUMIN 2.5*  AST 88*  ALT 35  ALKPHOS 92  BILITOT 2.7*   PT/INR Recent Labs    06/26/22 1721 06/27/22 1513  LABPROT 21.1* 20.3*  INR 1.8* 1.7*   Hepatitis Panel No results for input(s): "HEPBSAG", "HCVAB", "HEPAIGM", "HEPBIGM" in the last 72 hours. C-Diff No results for input(s): "CDIFFTOX" in the last 72 hours. No results for input(s): "CDIFFPCR" in the last 72 hours.   Studies/Results: [place] EKG SITE RITE  Result Date: 06/27/2022 If Site Rite image not attached, placement could not be confirmed due to current cardiac rhythm.  CT Head Wo Contrast  Result Date: 06/26/2022 CLINICAL DATA:  Mental status  change of unknown cause. Fall 2 days ago. EXAM: CT HEAD WITHOUT CONTRAST TECHNIQUE: Contiguous axial images were obtained from the base of the skull through the vertex without intravenous contrast. RADIATION DOSE  REDUCTION: This exam was performed according to the departmental dose-optimization program which includes automated exposure control, adjustment of the mA and/or kV according to patient size and/or use of iterative reconstruction technique. COMPARISON:  05/30/2022 FINDINGS: Brain: No evidence of acute infarction, hemorrhage, hydrocephalus, extra-axial collection or mass lesion/mass effect. Mild cerebral atrophy. Vascular: No hyperdense vessel or unexpected calcification. Skull: Normal. Negative for fracture or focal lesion. Sinuses/Orbits: Paranasal sinuses demonstrate mild mucosal thickening. Opacification of the left mastoid air cells with air-fluid level possibly indicating mastoiditis. Right ocular prosthesis. Other: None. IMPRESSION: 1. No acute intracranial abnormalities.  Mild cerebral atrophy. 2. Air-fluid levels in the left mastoid air cells may indicate mastoiditis. No acute fractures are identified. Electronically Signed   By: Burman Nieves M.D.   On: 06/26/2022 18:06   DG Chest Port 1 View  Result Date: 06/26/2022 CLINICAL DATA:  Questionable sepsis. EXAM: PORTABLE CHEST 1 VIEW COMPARISON:  May 30, 2022 FINDINGS: The cardiomediastinal silhouette is normal. Obscuration left hemidiaphragm, also identified on the previous study. No definite infiltrate on the right. No other abnormalities. IMPRESSION: Stable obscuration of the left hemidiaphragm is favored represent atelectasis. A PA and lateral chest x-ray could better evaluate. No other abnormalities. Electronically Signed   By: Gerome Sam III M.D.   On: 06/26/2022 17:38    Scheduled Inpatient Medications:    Chlorhexidine Gluconate Cloth  6 each Topical Q0600   dextrose  12.5 g Intravenous STAT   DULoxetine  20 mg Oral Daily   fluticasone furoate-vilanterol  1 puff Inhalation Daily   insulin aspart  0-6 Units Subcutaneous TID WC   lactulose  10 g Oral TID   lactulose  300 mL Rectal Q6H   mupirocin ointment  1 Application Nasal BID    [START ON 06/30/2022] pantoprazole  40 mg Intravenous Q12H   sertraline  200 mg Oral Daily   sodium chloride flush  10-40 mL Intracatheter Q12H   sodium chloride flush  3 mL Intravenous Q12H   tenofovir  300 mg Oral Daily   thiamine  100 mg Oral Daily    Continuous Inpatient Infusions:    sodium chloride Stopped (06/27/22 0033)   albumin human Stopped (06/27/22 1351)   cefTRIAXone (ROCEPHIN)  IV Stopped (06/27/22 1801)   metronidazole 100 mL/hr at 06/28/22 0600   octreotide (SANDOSTATIN) 500 mcg in sodium chloride 0.9 % 250 mL (2 mcg/mL) infusion 50 mcg/hr (06/28/22 0600)   pantoprazole 8 mg/hr (06/28/22 0600)    PRN Inpatient Medications:  acetaminophen, ipratropium-albuterol, morphine injection, ondansetron **OR** ondansetron (ZOFRAN) IV, oxyCODONE, sodium chloride flush  Miscellaneous: N/A  Assessment:  Hepatic encephalopathy - receiving lactulose enemas, awaiting some improvement in mentation. Melena - No current bleeding per RN. On octreotide and protonix gtt's. Hx Grade II esophageal varices, Portal hypertensive gastropathy. Child Pugh Class "C" Cirrhosis - 11 pts.  Anemia secondary to GI blood loss. Hgb improved to 7.4 after transfusion.  Sepsis - On empiric antibiotics. Awaiting paracentesis. Ascites, as above - On Cipro, Flagyl.  Plan:  Continue current management. EGD after encephalopathy improvement, otherwise will perform urgently for any overt hemorrhage. As previously mentioned, keep any blood/fluid infuusion rates low to avoid increasing portal pressures that would lead to variceal rupture. Continuing to closely follow clinical status. Thank you.  Damoni Causby K. Norma Fredrickson, M.D. 06/28/2022, 7:52  AM

## 2022-06-28 NOTE — Progress Notes (Signed)
Progress Note   Patient: Barry [name] ZOX:[id number] DOB: 1955/11/27 DOA: 06/26/2022     2 DOS: the patient was seen and examined on 06/28/2022   Brief hospital course: Barry [name] is a 67 y.o. male with medical history significant for decompensated cirrhosis with ascites and varices, CVA, type 2 diabetes, CAD, depression/anxiety, CKD stage III, who presents from SNF with altered mental status. Hb noted to be low, got 1 unit PRBC. Ammonia high admitted for further management and evaluation. Recent admission Barry [name] for similar presentation when he had EGD showed grade 2 varices without active bleeding as well as portal hypertensive gastropathy with active oozing in the antrum, also diagnosed with hep B started tenofovir.  He has been lethargic, restless. Hb low got 3units total PRBC. GI consulted, may go for EGD once mental status improves.  Assessment and Plan: Upper GI bleed Acute blood loss anemia - Known esophageal and gastric varices from recent EGD. Hb 7.4 s/p 3 units PRBC transfusion. Continue to monitor H/H closely. No active bleeding today. NPO. Gentle IV fluids with D5 ordered due to hypoglycemia. Continue Octreotide gtt, pantoprazole gtt, Rocephin tx. Hold Aspirin. GI follow up appreciated, EGD once mental status improves.  * Metabolic encephalopathy (HCC)- Multifactorial in setting of sepsis, SBP, liver cirrhosis. Continue Lactulose per rectum. He is lethargic, but restless. IV ativan for restlessness. Continue neuro checks. Monitor daily electrolytes, renal function.  Severe sepsis (HCC) SBP (spontaneous bacterial peritonitis) (HCC)- Presumed diagnosis in setting of likely GI bleed, sepsis physiology. Continue ceftriaxone and Flagyl Ultrasound guided paracentesis order placed.  AKI (acute kidney injury) (HCC) Hypovolemia in setting of severe sepsis/SBP versus HRS.  Did receive IV fluids in ED. Hold for now Continue albumin infusion as ordered. Hold nephrotoxic  drugs. Daily CMP.  Hyperbilirubinemia Elevated LFT- known cirrhotic and Hep B patient. Trend LFT.  Continue Tenofovir once more alert.  Asthma- Continue duonebs needed  Type 2 diabetes - very sensitive sliding scale insulin correction.   Hold metformin in setting of AKI.  Hyperlipidemia- Hold atorvastatin in setting of transaminitis  Hypertension- Beta blocker on hold due to upper GI bleed.  DVT prophylaxis - SCD, no heparin Nursing supportive care, Fall, aspiration, seizure precautions.      Subjective: Patient is seen and examined today morning. He is sleepy and lethargic. Unable to provide any history. RN notified that he had large dark stool. Hb 5.5, 2 unit transfusion ordered. No family at bedside. GI saw him this morning.   Physical Exam: Vitals:   06/28/22 0600 06/28/22 0730 06/28/22 0800 06/28/22 0830  BP: (!) 110/56 110/67 99/62 115/60  Pulse: 95  (!) 111 (!) 108  Resp: 18 (!) 40 17   Temp:    98.9 F (37.2 C)  TempSrc:    Axillary  SpO2: 93%  93% 94%  Weight:      Height:       General- Elderly obese ill Caucasian male, lethargic, sleepy. HEENT- left eye bruise noted. Lungs bibasal rales, rhonchi. Heart- S1, S2 heard, no murmurs. Extremity edema noted. Abdomen - distended, +fluid thrill. Neuro - lethargic, restless  Data Reviewed:  CBC, BMP, cultures, ammonia  Family Communication: Discussed with patient's daughter Barry [name] over phone who lives in [place]. Poor prognosis explained. She wants to be notified early for clinical deterioration or any updates. She or Barry [name] (her cousin) to be notified of his health status. She understands and agrees.  Disposition: Status is: Inpatient Remains inpatient appropriate because: sick, altered, hemoglobin low,  GI work up  Planned Discharge Destination: Skilled nursing facility    MDM level 3- Patient is altered, has liver cirrhosis with complications, GI bleed, blood loss anemia need close hemodynamic,  neurological, telemetry monitoring. He is at high risk for clinical deterioration.  Author: Marcelino Duster, MD 06/28/2022 10:13 AM  For on call review [email or web address].

## 2022-06-28 NOTE — Plan of Care (Signed)
  Problem: Education: Goal: Knowledge of General Education information will improve Description Including pain rating scale, medication(s)/side effects and non-pharmacologic comfort measures Outcome: Not Progressing   Problem: Health Behavior/Discharge Planning: Goal: Ability to manage health-related needs will improve Outcome: Not Progressing   Problem: Clinical Measurements: Goal: Ability to maintain clinical measurements within normal limits will improve Outcome: Not Progressing   Problem: Nutrition: Goal: Adequate nutrition will be maintained Outcome: Not Progressing   

## 2022-06-28 NOTE — Progress Notes (Signed)
Pt has only voided 200 cc since 7 a.m., bladder scanned pt yield 120 cc. Dr. Clide Dales notified. Received verbal orders to increase continuous fluid to 63ml/hr and continue to monitor.

## 2022-06-28 NOTE — TOC Initial Note (Signed)
Transition of Care [facility]) - Initial/Assessment Note    Patient Details  Name: Barry [name] MRN: [id number] Date of Birth: [birth year]  Transition [facility]) CM/SW Contact:    Darolyn Rua, LCSW Phone Number: 06/28/2022, 9:44 AM  Clinical Narrative:                  [facility] consult to contact Chales Abrahams. CSW spoke with Chales Abrahams who is noted to be ex wife in chart, she confirms patient is from [facility]. Patient was there for STR admitted 5/30, reports plan would be to return.   CSW spoke with Tanya with [facility] who confirms they are able to accept patient once medically stable.   When patient is closer to medical readiness to discharge, insurance authorization will have to be completed with [facility].   TOC will continue to follow.     Barriers to Discharge: Continued Medical Work up   Patient Goals and CMS Choice Patient states their goals for this hospitalization and ongoing recovery are:: to return to snf CMS Medicare.gov Compare Post Acute Care list provided to:: Patient Represenative (must comment) (ex wife Chales Abrahams) Choice offered to / list presented to : Spouse      Expected Discharge Plan and Services       Living arrangements for the past 2 months: Skilled Nursing Facility                                      Prior Living Arrangements/Services Living arrangements for the past 2 months: Skilled Nursing Facility Lives with:: Facility Resident                   Activities of Daily Living Home Assistive Devices/Equipment: Medical laboratory scientific officer (specify quad or straight), Eyeglasses, Environmental consultant (specify type), Prosthesis (glass eye) ADL Screening (condition at time of admission) Patient's cognitive ability adequate to safely complete daily activities?: No Is the patient deaf or have difficulty hearing?: No Does the patient have difficulty seeing, even when wearing glasses/contacts?: No Does the patient have difficulty concentrating, remembering,  or making decisions?: Yes Patient able to express need for assistance with ADLs?: No Does the patient have difficulty dressing or bathing?: Yes Independently performs ADLs?: No Communication: Needs assistance Is this a change from baseline?: Change from baseline, expected to last <3 days Dressing (OT): Needs assistance Is this a change from baseline?: Change from baseline, expected to last <3days Grooming: Needs assistance Is this a change from baseline?: Change from baseline, expected to last <3 days Feeding: Needs assistance Is this a change from baseline?: Change from baseline, expected to last <3 days Bathing: Needs assistance Is this a change from baseline?: Change from baseline, expected to last <3 days Toileting: Needs assistance Is this a change from baseline?: Change from baseline, expected to last <3 days In/Out Bed: Needs assistance Is this a change from baseline?: Change from baseline, expected to last <3 days Walks in Home: Needs assistance Is this a change from baseline?: Change from baseline, expected to last <3 days Does the patient have difficulty walking or climbing stairs?: Yes Weakness of Legs: Both Weakness of Arms/Hands: Both  Permission Sought/Granted                  Emotional Assessment              Admission diagnosis:  Hepatic encephalopathy (HCC) [K76.82] AKI (  acute kidney injury) (HCC) [N17.9] Acute hepatic encephalopathy (HCC) [K76.82] Altered mental status, unspecified altered mental status type [R41.82] Patient Active Problem List   Diagnosis Date Noted   Acute blood loss anemia 06/27/2022   Hepatic encephalopathy (HCC) 06/26/2022   Severe sepsis (HCC) 06/26/2022   SBP (spontaneous bacterial peritonitis) (HCC) 06/26/2022   Upper GI bleed 06/26/2022   Known medical problems 06/26/2022   Acute hepatitis B 06/26/2022   Hyperbilirubinemia 06/26/2022   Portal hypertensive gastropathy (HCC) 06/03/2022   Occult blood in stools 06/01/2022    Symptomatic anemia 05/30/2022   Sleep apnea in adult 04/24/2022   Right hip pain 04/21/2022   Hypertension 04/21/2022   OSA (obstructive sleep apnea) 04/21/2022   AKI (acute kidney injury) (HCC) 04/19/2022   Hypotension 04/18/2022   Acute metabolic encephalopathy 04/18/2022   Manifestations of thiamine deficiency 03/06/2022   Rash and nonspecific skin eruption 03/02/2022   Cirrhosis of liver with ascites (HCC) 03/01/2022   Deficiency anemia 03/01/2022   Chronic seborrheic dermatitis 03/01/2022   Genetic testing 10/30/2019   Family history of colon cancer 10/15/2019   IDA (iron deficiency anemia) 07/14/2018   GERD (gastroesophageal reflux disease) 11/03/2017   Thrombocytopenia (HCC) 08/26/2017   Chronic pain of both shoulders 05/18/2017   Abnormal nuclear stress test    Microalbuminuria due to type 2 diabetes mellitus (HCC) 02/16/2017   Type 2 diabetes mellitus with diabetic neuropathy, with long-term current use of insulin (HCC) 06/20/2015   Chest pain, atypical 03/11/2015   Hyperlipidemia LDL goal <70 03/26/2014   Knee osteoarthritis 09/18/2013   Lumbosacral radiculopathy 09/04/2013   Insomnia 08/14/2013   Essential hypertension, benign 05/15/2013   Erectile dysfunction associated with type 2 diabetes mellitus (HCC) 01/09/2013   Depression with anxiety 09/05/2012   CAD (coronary artery disease) 06/26/2012   Morbid obesity due to excess calories (HCC) 06/26/2012   Obstructive sleep apnea    PCP:  [name], MD Pharmacy:   [facility] [phone] - [place], [place] - [address] AT [facility] [address] [place] [address] Phone: [phone] Fax: [phone]     Social Determinants of Health (SDOH) Social History: SDOH Screenings   Food Insecurity: Patient Unable To Answer (06/28/2022)  Housing: Patient Unable To Answer (06/28/2022)  Transportation Needs: Patient Unable To Answer (06/28/2022)  Utilities: Patient Unable To  Answer (06/28/2022)  Depression (PHQ2-9): Low Risk  (05/16/2019)  Tobacco Use: Low Risk  (06/26/2022)   SDOH Interventions:     Readmission Risk Interventions    06/03/2022   11:00 AM 06/02/2022    1:36 PM  Readmission Risk Prevention Plan  Transportation Screening Complete Complete  PCP or Specialist Appt within 5-7 Days  Complete  Home Care Screening  Complete  Medication Review (RN CM)  Complete

## 2022-06-28 NOTE — Procedures (Signed)
PROCEDURE SUMMARY:  Successful image-guided paracentesis from the left lower abdomen.  Yielded 3 liters of yellow fluid.  No immediate complications.  EBL = trace. Patient tolerated well.   Specimen was sent for labs.  Please see imaging section of Epic for full dictation.   Kennieth Francois PA-C 06/28/2022 4:07 PM

## 2022-06-28 NOTE — [place] FL2 (Signed)
[place] MEDICAID FL2 LEVEL OF CARE FORM     IDENTIFICATION  Patient Name: Barry [name] Birthdate: 03-Aug-1955 Sex: male Admission Date (Current Location): 06/26/2022  [facility] and [place] Number:  Chiropodist and Address:  [facility], [address], [place], [place] [address]      Provider Number: [id number]  Attending Physician Name and Address:  Marcelino Duster, MD  Relative Name and Phone Number:  Chales Abrahams  [phone]    Current Level of Care: Hospital Recommended Level of Care: Skilled Nursing Facility Prior Approval Number:    Date Approved/Denied:   PASRR Number: [id number]  Discharge Plan: SNF    Current Diagnoses: Patient Active Problem List   Diagnosis Date Noted   Acute blood loss anemia 06/27/2022   Hepatic encephalopathy (HCC) 06/26/2022   Severe sepsis (HCC) 06/26/2022   SBP (spontaneous bacterial peritonitis) (HCC) 06/26/2022   Upper GI bleed 06/26/2022   Known medical problems 06/26/2022   Acute hepatitis B 06/26/2022   Hyperbilirubinemia 06/26/2022   Portal hypertensive gastropathy (HCC) 06/03/2022   Occult blood in stools 06/01/2022   Symptomatic anemia 05/30/2022   Sleep apnea in adult 04/24/2022   Right hip pain 04/21/2022   Hypertension 04/21/2022   OSA (obstructive sleep apnea) 04/21/2022   AKI (acute kidney injury) (HCC) 04/19/2022   Hypotension 04/18/2022   Acute metabolic encephalopathy 04/18/2022   Manifestations of thiamine deficiency 03/06/2022   Rash and nonspecific skin eruption 03/02/2022   Cirrhosis of liver with ascites (HCC) 03/01/2022   Deficiency anemia 03/01/2022   Chronic seborrheic dermatitis 03/01/2022   Genetic testing 10/30/2019   Family history of colon cancer 10/15/2019   IDA (iron deficiency anemia) 07/14/2018   GERD (gastroesophageal reflux disease) 11/03/2017   Thrombocytopenia (HCC) 08/26/2017   Chronic pain of both shoulders 05/18/2017   Abnormal nuclear stress test     Microalbuminuria due to type 2 diabetes mellitus (HCC) 02/16/2017   Type 2 diabetes mellitus with diabetic neuropathy, with long-term current use of insulin (HCC) 06/20/2015   Chest pain, atypical 03/11/2015   Hyperlipidemia LDL goal <70 03/26/2014   Knee osteoarthritis 09/18/2013   Lumbosacral radiculopathy 09/04/2013   Insomnia 08/14/2013   Essential hypertension, benign 05/15/2013   Erectile dysfunction associated with type 2 diabetes mellitus (HCC) 01/09/2013   Depression with anxiety 09/05/2012   CAD (coronary artery disease) 06/26/2012   Morbid obesity due to excess calories (HCC) 06/26/2012   Obstructive sleep apnea     Orientation RESPIRATION BLADDER Height & Weight        O2 (2L nasal cannula) Continent, External catheter Weight: 230 lb 13.2 oz (104.7 kg) Height:  5\' 4"  (162.6 cm)  BEHAVIORAL SYMPTOMS/MOOD NEUROLOGICAL BOWEL NUTRITION STATUS      Incontinent Diet (see discharge summary)  AMBULATORY STATUS COMMUNICATION OF NEEDS Skin   Extensive Assist Verbally Other (Comment) (open wound right upper arm)                       Personal Care Assistance Level of Assistance  Bathing, Feeding, Dressing, Total care Bathing Assistance: Limited assistance Feeding assistance: Independent Dressing Assistance: Limited assistance Total Care Assistance: Limited assistance   Functional Limitations Info  Sight, Hearing, Speech Sight Info: Impaired Hearing Info: Adequate Speech Info: Adequate    SPECIAL CARE FACTORS FREQUENCY  PT (By licensed PT), OT (By licensed OT)     PT Frequency: min 4x weekly OT Frequency: min 4x weekly  Contractures Contractures Info: Not present    Additional Factors Info  Code Status, Allergies Code Status Info: full Allergies Info: NKA           Current Medications (06/28/2022):  This is the current hospital active medication list Current Facility-Administered Medications  Medication Dose Route Frequency Provider Last  Rate Last Admin   0.9 %  sodium chloride infusion  10 mL/hr Intravenous Once Venora Maples, MD   Held at 06/27/22 0033   acetaminophen (TYLENOL) tablet 650 mg  650 mg Oral Q8H PRN Venora Maples, MD       cefTRIAXone (ROCEPHIN) 2 g in sodium chloride 0.9 % 100 mL IVPB  2 g Intravenous Q24H Venora Maples, MD   Stopped at 06/27/22 1801   Chlorhexidine Gluconate Cloth 2 % PADS 6 each  6 each Topical Q0600 Venora Maples, MD   6 each at 06/28/22 0141   dextrose 5 % solution   Intravenous Continuous Sreeram, Narendranath, MD       dextrose 50 % solution 12.5 g  12.5 g Intravenous STAT Marcelino Duster, MD       DULoxetine (CYMBALTA) DR capsule 20 mg  20 mg Oral Daily Venora Maples, MD       fluticasone furoate-vilanterol (BREO ELLIPTA) 100-25 MCG/ACT 1 puff  1 puff Inhalation Daily Venora Maples, MD       insulin aspart (novoLOG) injection 0-6 Units  0-6 Units Subcutaneous TID WC Venora Maples, MD       ipratropium-albuterol (DUONEB) 0.5-2.5 (3) MG/3ML nebulizer solution 3 mL  3 mL Nebulization Q6H PRN Venora Maples, MD       lactulose (CHRONULAC) 10 GM/15ML solution 10 g  10 g Oral TID Venora Maples, MD       lactulose [facility]) enema 200 gm  300 mL Rectal Q6H Sreeram, Narendranath, MD       LORazepam (ATIVAN) injection 0.5 mg  0.5 mg Intravenous Q4H PRN Marcelino Duster, MD       metroNIDAZOLE (FLAGYL) IVPB 500 mg  500 mg Intravenous Q12H Venora Maples, MD   Stopped at 06/28/22 [phone]   morphine (PF) 2 MG/ML injection 2 mg  2 mg Intravenous Q3H PRN Marcelino Duster, MD   2 mg at 06/28/22 0729   mupirocin ointment (BACTROBAN) 2 % 1 Application  1 Application Nasal BID Marcelino Duster, MD   1 Application at 06/28/22 0841   octreotide (SANDOSTATIN) 500 mcg in sodium chloride 0.9 % 250 mL (2 mcg/mL) infusion  50 mcg/hr Intravenous Continuous Venora Maples, MD 25 mL/hr at 06/28/22 0900 50 mcg/hr at 06/28/22 0900   ondansetron  (ZOFRAN) tablet 4 mg  4 mg Oral Q6H PRN Venora Maples, MD       Or   ondansetron [facility]) injection 4 mg  4 mg Intravenous Q6H PRN Venora Maples, MD       oxyCODONE (Oxy IR/ROXICODONE) immediate release tablet 5 mg  5 mg Oral Q4H PRN Venora Maples, MD       [START ON 06/30/2022] pantoprazole (PROTONIX) injection 40 mg  40 mg Intravenous Q12H Venora Maples, MD       pantoprozole (PROTONIX) 80 mg /NS 100 mL infusion  8 mg/hr Intravenous Continuous Venora Maples, MD 10 mL/hr at 06/28/22 0900 8 mg/hr at 06/28/22 0900   potassium chloride 10 mEq in 100 mL IVPB  10 mEq Intravenous Q1 Hr x 3 Marcelino Duster, MD  sertraline (ZOLOFT) tablet 200 mg  200 mg Oral Daily Venora Maples, MD       sodium chloride flush (NS) 0.9 % injection 10-40 mL  10-40 mL Intracatheter Q12H Sreeram, Narendranath, MD   10 mL at 06/28/22 0841   sodium chloride flush (NS) 0.9 % injection 10-40 mL  10-40 mL Intracatheter PRN Marcelino Duster, MD       sodium chloride flush (NS) 0.9 % injection 3 mL  3 mL Intravenous Q12H Venora Maples, MD   3 mL at 06/28/22 5621   tenofovir (VIREAD) tablet 300 mg  300 mg Oral Daily Venora Maples, MD       thiamine (VITAMIN B1) tablet 100 mg  100 mg Oral Daily Venora Maples, MD         Discharge Medications: Please see discharge summary for a list of discharge medications.  Relevant Imaging Results:  Relevant Lab Results:   Additional Information SSN [id number]  Darolyn Rua, LCSW

## 2022-06-29 ENCOUNTER — Inpatient Hospital Stay: Payer: Medicare HMO

## 2022-06-29 ENCOUNTER — Inpatient Hospital Stay (HOSPITAL_COMMUNITY)
Admit: 2022-06-29 | Discharge: 2022-06-29 | Disposition: A | Discharge status: Home / Self Care | Payer: Medicare HMO | Attending: Critical Care Medicine | Admitting: Critical Care Medicine

## 2022-06-29 DIAGNOSIS — J9601 Acute respiratory failure with hypoxia: Secondary | ICD-10-CM | POA: Diagnosis not present

## 2022-06-29 DIAGNOSIS — J810 Acute pulmonary edema: Secondary | ICD-10-CM | POA: Diagnosis not present

## 2022-06-29 DIAGNOSIS — Z515 Encounter for palliative care: Secondary | ICD-10-CM

## 2022-06-29 DIAGNOSIS — K652 Spontaneous bacterial peritonitis: Secondary | ICD-10-CM | POA: Diagnosis not present

## 2022-06-29 DIAGNOSIS — I5031 Acute diastolic (congestive) heart failure: Secondary | ICD-10-CM | POA: Diagnosis not present

## 2022-06-29 DIAGNOSIS — N179 Acute kidney failure, unspecified: Secondary | ICD-10-CM | POA: Diagnosis not present

## 2022-06-29 DIAGNOSIS — K7682 Hepatic encephalopathy: Secondary | ICD-10-CM | POA: Diagnosis not present

## 2022-06-29 DIAGNOSIS — K7460 Unspecified cirrhosis of liver: Secondary | ICD-10-CM | POA: Diagnosis not present

## 2022-06-29 DIAGNOSIS — R0609 Other forms of dyspnea: Secondary | ICD-10-CM

## 2022-06-29 LAB — BLOOD GAS, ARTERIAL
Acid-base deficit: 6.2 mmol/L — ABNORMAL HIGH (ref 0.0–2.0)
Acid-base deficit: 3.9 mmol/L — ABNORMAL HIGH (ref 0.0–2.0)
Bicarbonate: 19 mmol/L — ABNORMAL LOW (ref 20.0–28.0)
Bicarbonate: 24.1 mmol/L (ref 20.0–28.0)
Delivery systems: POSITIVE
Expiratory PAP: 6 cmH2O
FIO2: 40 %
FIO2: 60 %
Inspiratory PAP: 12 cmH2O
MECHVT: 450 mL
Mechanical Rate: 10
Mechanical Rate: 16
O2 Saturation: 97.2 %
O2 Saturation: 98.1 %
PEEP: 5 cmH2O
Patient temperature: 37
Patient temperature: 37
pCO2 arterial: 36 mmHg (ref 32–48)
pCO2 arterial: 55 mmHg — ABNORMAL HIGH (ref 32–48)
pH, Arterial: 7.33 — ABNORMAL LOW (ref 7.35–7.45)
pH, Arterial: 7.25 — ABNORMAL LOW (ref 7.35–7.45)
pO2, Arterial: 75 mmHg — ABNORMAL LOW (ref 83–108)
pO2, Arterial: 99 mmHg (ref 83–108)

## 2022-06-29 LAB — COMPREHENSIVE METABOLIC PANEL
ALT: 49 U/L — ABNORMAL HIGH (ref 0–44)
AST: 125 U/L — ABNORMAL HIGH (ref 15–41)
Albumin: 3.3 g/dL — ABNORMAL LOW (ref 3.5–5.0)
Alkaline Phosphatase: 92 U/L (ref 38–126)
Anion gap: 21 — ABNORMAL HIGH (ref 5–15)
BUN: 37 mg/dL — ABNORMAL HIGH (ref 8–23)
CO2: 18 mmol/L — ABNORMAL LOW (ref 22–32)
Calcium: 8.5 mg/dL — ABNORMAL LOW (ref 8.9–10.3)
Chloride: 105 mmol/L (ref 98–111)
Creatinine, Ser: 1.39 mg/dL — ABNORMAL HIGH (ref 0.61–1.24)
GFR, Estimated: 56 mL/min — ABNORMAL LOW (ref >60)
Glucose, Bld: 188 mg/dL — ABNORMAL HIGH (ref 70–99)
Potassium: 3.6 mmol/L (ref 3.5–5.1)
Sodium: 144 mmol/L (ref 135–145)
Total Bilirubin: 2.7 mg/dL — ABNORMAL HIGH (ref 0.3–1.2)
Total Protein: 6.3 g/dL — ABNORMAL LOW (ref 6.5–8.1)

## 2022-06-29 LAB — CBC WITH DIFFERENTIAL/PLATELET
Abs Immature Granulocytes: 0.04 10*3/uL (ref 0.00–0.07)
Basophils Absolute: 0 10*3/uL (ref 0.0–0.1)
Basophils Relative: 0 %
Eosinophils Absolute: 0 10*3/uL (ref 0.0–0.5)
Eosinophils Relative: 0 %
HCT: 22.6 % — ABNORMAL LOW (ref 39.0–52.0)
Hemoglobin: 7.1 g/dL — ABNORMAL LOW (ref 13.0–17.0)
Immature Granulocytes: 1 %
Lymphocytes Relative: 9 %
Lymphs Abs: 0.7 10*3/uL (ref 0.7–4.0)
MCH: 31.3 pg (ref 26.0–34.0)
MCHC: 31.4 g/dL (ref 30.0–36.0)
MCV: 99.6 fL (ref 80.0–100.0)
Monocytes Absolute: 0.6 10*3/uL (ref 0.1–1.0)
Monocytes Relative: 8 %
Neutro Abs: 6.2 10*3/uL (ref 1.7–7.7)
Neutrophils Relative %: 82 %
Platelets: 41 10*3/uL — ABNORMAL LOW (ref 150–400)
RBC: 2.27 MIL/uL — ABNORMAL LOW (ref 4.22–5.81)
RDW: 20.3 % — ABNORMAL HIGH (ref 11.5–15.5)
WBC: 7.6 10*3/uL (ref 4.0–10.5)
nRBC: 0.3 % — ABNORMAL HIGH (ref 0.0–0.2)

## 2022-06-29 LAB — GLUCOSE, CAPILLARY
Glucose-Capillary: 121 mg/dL — ABNORMAL HIGH (ref 70–99)
Glucose-Capillary: 126 mg/dL — ABNORMAL HIGH (ref 70–99)
Glucose-Capillary: 126 mg/dL — ABNORMAL HIGH (ref 70–99)
Glucose-Capillary: 138 mg/dL — ABNORMAL HIGH (ref 70–99)
Glucose-Capillary: 147 mg/dL — ABNORMAL HIGH (ref 70–99)
Glucose-Capillary: 149 mg/dL — ABNORMAL HIGH (ref 70–99)

## 2022-06-29 LAB — CBC
HCT: 22.6 % — ABNORMAL LOW (ref 39.0–52.0)
Hemoglobin: 7.2 g/dL — ABNORMAL LOW (ref 13.0–17.0)
MCH: 32 pg (ref 26.0–34.0)
MCHC: 31.9 g/dL (ref 30.0–36.0)
MCV: 100.4 fL — ABNORMAL HIGH (ref 80.0–100.0)
Platelets: 44 10*3/uL — ABNORMAL LOW (ref 150–400)
RBC: 2.25 MIL/uL — ABNORMAL LOW (ref 4.22–5.81)
RDW: 20.6 % — ABNORMAL HIGH (ref 11.5–15.5)
WBC: 6.4 10*3/uL (ref 4.0–10.5)
nRBC: 0.3 % — ABNORMAL HIGH (ref 0.0–0.2)

## 2022-06-29 LAB — ECHOCARDIOGRAM LIMITED: Height: 64 in

## 2022-06-29 LAB — TROPONIN I (HIGH SENSITIVITY)
Troponin I (High Sensitivity): 138 ng/L (ref <18)
Troponin I (High Sensitivity): 131 ng/L (ref <18)
Troponin I (High Sensitivity): 133 ng/L (ref <18)

## 2022-06-29 LAB — PATHOLOGIST SMEAR REVIEW

## 2022-06-29 LAB — BRAIN NATRIURETIC PEPTIDE: B Natriuretic Peptide: 999 pg/mL — ABNORMAL HIGH (ref 0.0–100.0)

## 2022-06-29 LAB — PHOSPHORUS: Phosphorus: 3.9 mg/dL (ref 2.5–4.6)

## 2022-06-29 LAB — PROCALCITONIN: Procalcitonin: 0.2 ng/mL

## 2022-06-29 LAB — MAGNESIUM: Magnesium: 1.6 mg/dL — ABNORMAL LOW (ref 1.7–2.4)

## 2022-06-29 LAB — LACTIC ACID, PLASMA: Lactic Acid, Venous: 0.9 mmol/L (ref 0.5–1.9)

## 2022-06-29 LAB — CULTURE, BLOOD (ROUTINE X 2): Culture: NO GROWTH

## 2022-06-29 LAB — AMMONIA: Ammonia: 48 umol/L — ABNORMAL HIGH (ref 9–35)

## 2022-06-29 LAB — AEROBIC/ANAEROBIC CULTURE W GRAM STAIN (SURGICAL/DEEP WOUND)

## 2022-06-29 MED ORDER — INSULIN ASPART 100 UNIT/ML IJ SOLN
0.0000 [IU] | INTRAMUSCULAR | Status: DC
  Administered 2022-07-02 (×4): 1 [IU] via SUBCUTANEOUS
  Administered 2022-07-02: 2 [IU] via SUBCUTANEOUS
  Administered 2022-07-03 (×4): 1 [IU] via SUBCUTANEOUS
  Filled 2022-06-29 (×9): qty 1

## 2022-06-29 MED ORDER — ETOMIDATE 2 MG/ML IV SOLN
20.0000 mg | Freq: Once | INTRAVENOUS | Status: AC
  Administered 2022-06-29: 20 mg via INTRAVENOUS
  Filled 2022-06-29: qty 10

## 2022-06-29 MED ORDER — LORAZEPAM BOLUS VIA INFUSION
1.0000 mg | Freq: Once | INTRAVENOUS | Status: DC
  Filled 2022-06-29: qty 1

## 2022-06-29 MED ORDER — SODIUM CHLORIDE 0.9 % IV SOLN
250.0000 mL | INTRAVENOUS | Status: DC
  Administered 2022-06-29: 250 mL via INTRAVENOUS

## 2022-06-29 MED ORDER — NOREPINEPHRINE 4 MG/250ML-% IV SOLN
2.0000 ug/min | INTRAVENOUS | Status: DC
  Administered 2022-06-29 – 2022-07-01 (×2): 2 ug/min via INTRAVENOUS
  Administered 2022-07-02: 9 ug/min via INTRAVENOUS
  Administered 2022-07-02: 10 ug/min via INTRAVENOUS
  Administered 2022-07-02: 6 ug/min via INTRAVENOUS
  Administered 2022-07-03 (×2): 10 ug/min via INTRAVENOUS
  Filled 2022-06-29 (×7): qty 250

## 2022-06-29 MED ORDER — SODIUM BICARBONATE 8.4 % IV SOLN
100.0000 meq | Freq: Once | INTRAVENOUS | Status: AC
  Administered 2022-06-29: 100 meq via INTRAVENOUS
  Filled 2022-06-29: qty 100

## 2022-06-29 MED ORDER — THIAMINE HCL 100 MG/ML IJ SOLN
500.0000 mg | Freq: Every day | INTRAVENOUS | Status: AC
  Administered 2022-06-29 – 2022-07-02 (×4): 500 mg via INTRAVENOUS
  Filled 2022-06-29 (×4): qty 5

## 2022-06-29 MED ORDER — FENTANYL CITRATE PF 50 MCG/ML IJ SOSY: 25.0000 ug | PREFILLED_SYRINGE | Freq: Once | INTRAMUSCULAR | Status: AC

## 2022-06-29 MED ORDER — ROCURONIUM BROMIDE 10 MG/ML (PF) SYRINGE
100.0000 mg | PREFILLED_SYRINGE | Freq: Once | INTRAVENOUS | Status: AC
  Administered 2022-06-29: 100 mg via INTRAVENOUS
  Filled 2022-06-29: qty 10

## 2022-06-29 MED ORDER — ALBUMIN HUMAN 25 % IV SOLN
12.5000 g | Freq: Once | INTRAVENOUS | Status: AC
  Administered 2022-06-29: 12.5 g via INTRAVENOUS
  Filled 2022-06-29: qty 50

## 2022-06-29 MED ORDER — MAGNESIUM SULFATE 4 GM/100ML IV SOLN
4.0000 g | Freq: Once | INTRAVENOUS | Status: AC
  Administered 2022-06-29: 4 g via INTRAVENOUS
  Filled 2022-06-29: qty 100

## 2022-06-29 MED ORDER — FENTANYL 2500MCG IN NS 250ML (10MCG/ML) PREMIX INFUSION
25.0000 ug/h | INTRAVENOUS | Status: DC
  Administered 2022-06-29: 50 ug/h via INTRAVENOUS
  Administered 2022-06-30: 175 ug/h via INTRAVENOUS
  Administered 2022-06-30 – 2022-07-02 (×3): 150 ug/h via INTRAVENOUS
  Administered 2022-07-02: 200 ug/h via INTRAVENOUS
  Administered 2022-07-03: 175 ug/h via INTRAVENOUS
  Filled 2022-06-29 (×7): qty 250

## 2022-06-29 MED ORDER — POLYETHYLENE GLYCOL 3350 17 G PO PACK: 17.0000 g | PACK | Freq: Every day | ORAL | Status: DC

## 2022-06-29 MED ORDER — FUROSEMIDE 10 MG/ML IJ SOLN
40.0000 mg | Freq: Once | INTRAMUSCULAR | Status: AC
  Administered 2022-06-29: 40 mg via INTRAVENOUS
  Filled 2022-06-29: qty 4

## 2022-06-29 MED ORDER — SODIUM BICARBONATE 8.4 % IV SOLN
50.0000 meq | Freq: Once | INTRAVENOUS | Status: AC
  Administered 2022-06-29: 50 meq via INTRAVENOUS
  Filled 2022-06-29: qty 50

## 2022-06-29 MED ORDER — THIAMINE HCL 100 MG/ML IJ SOLN
100.0000 mg | Freq: Every day | INTRAMUSCULAR | Status: DC
  Administered 2022-07-03: 100 mg via INTRAVENOUS
  Filled 2022-06-29: qty 2

## 2022-06-29 MED ORDER — BUDESONIDE 0.25 MG/2ML IN SUSP
0.2500 mg | Freq: Two times a day (BID) | RESPIRATORY_TRACT | Status: DC
  Administered 2022-06-29 – 2022-06-30 (×2): 0.25 mg via RESPIRATORY_TRACT
  Filled 2022-06-29 (×2): qty 2

## 2022-06-29 MED ORDER — MIDAZOLAM HCL 2 MG/2ML IJ SOLN
1.0000 mg | INTRAMUSCULAR | Status: DC | PRN
  Administered 2022-06-29 – 2022-07-02 (×5): 2 mg via INTRAVENOUS
  Filled 2022-06-29 (×5): qty 2

## 2022-06-29 MED ORDER — DOCUSATE SODIUM 50 MG/5ML PO LIQD
100.0000 mg | Freq: Two times a day (BID) | ORAL | Status: DC
Start: 2022-06-29 — End: 2022-06-29

## 2022-06-29 MED ORDER — FENTANYL CITRATE (PF) 100 MCG/2ML IJ SOLN
100.0000 ug | Freq: Once | INTRAMUSCULAR | Status: AC
  Administered 2022-06-29: 50 ug via INTRAVENOUS
  Filled 2022-06-29: qty 2

## 2022-06-29 MED ORDER — FENTANYL BOLUS VIA INFUSION
25.0000 ug | INTRAVENOUS | Status: DC | PRN
  Administered 2022-07-02 – 2022-07-03 (×2): 50 ug via INTRAVENOUS

## 2022-06-29 MED ORDER — LORAZEPAM 2 MG/ML IJ SOLN
1.0000 mg | Freq: Once | INTRAMUSCULAR | Status: AC
  Administered 2022-06-29: 1 mg via INTRAVENOUS
  Filled 2022-06-29: qty 1

## 2022-06-29 NOTE — Progress Notes (Signed)
Consult was placed to IV team due to vasopressor infusion order. Per unit RN pt has sufficient access (triple lumen PICC in place). Advised to place another consult to IV team if needed.

## 2022-06-29 NOTE — Progress Notes (Signed)
Date and time results received: 06/29/22 0915   Test: LA Critical Value: 138  Name of Provider Notified: Annabelle Harman NP  Orders Received? Or Actions Taken?:  No new orders... Results given during rounds.

## 2022-06-29 NOTE — IPAL (Signed)
  Interdisciplinary Goals of Care Family Meeting   Date carried out: 06/29/2022  Location of the meeting: Phone conference  Member's involved: Physician, Bedside Registered Nurse, and Family Member or next of kin    GOALS OF CARE DISCUSSION  The Clinical status was relayed to family in detail- Daughter Barry [name] in [place], [name] cousin [phone]  Updated and notified of patients medical condition- Patient remains unresponsive and will not open eyes to command.   Patient is having a weak cough and struggling to remove secretions.   Patient with increased WOB and using accessory muscles to breathe Explained to family course of therapy and the modalities   Patient with Progressive multiorgan failure with a very high probablity of a very minimal chance of meaningful recovery despite all aggressive and optimal medical therapy.   Patient is LIMITED CODE CAN INTUBATE BUT NO CHEST COMPRESSIONS, NO SHOCK THERAPY  Family understands the situation. He is in the dying process Multiple organs failing   Family are satisfied with Plan of action and management. All questions answered  Additional CC time 45 mins   Searra Carnathan Santiago Glad, M.D.  [facility]  Medical Director [facility] Medical Director [facility] Cardio-Pulmonary Department

## 2022-06-29 NOTE — Progress Notes (Signed)
Critical care note:  Date of note: 06/29/2022  Subjective: The patient has been having worsening wheezing with dyspnea and diminished oximetry to 88% with minimal movements on 5 L of O2 by nasal cannula.  No reported chest pain.  No reported nausea or vomiting.  No fever or chills.  Objective: Physical  examination: Generally: Acute ill elderly Caucasian male in mild to moderate respiratory distress, later placed on BiPAP. Vital signs per history of present illness. Head - atraumatic, normocephalic.  Pupils - equal, round and reactive to light and accommodation. Extraocular movements are intact. No scleral icterus.  Oropharynx - moist mucous membranes and tongue. No pharyngeal erythema or exudate.  Neck - supple. No JVD. Carotid pulses 2+ bilaterally. No carotid bruits. No palpable thyromegaly or lymphadenopathy. Cardiovascular - regular rate and rhythm. Normal S1 and S2. No murmurs, gallops or rubs.  Lungs -diminished bibasilar breath sounds with bibasal rales. Abdomen - soft and nontender. Positive bowel sounds. No palpable organomegaly or masses.  Extremities -1-2+ bilateral lower extremity pitting edema, with no clubbing or cyanosis.  Neuro - grossly non-focal. Skin - no rashes. GU and rectal exam - deferred.   Notes and labs were reviewed.  Stat portable chest x-ray showed: significant increase in central vascular congestion and parenchymal opacities consistent with pulmonary edema.  Assessment/plan: 1.  Acute pulmonary edema likely associated with acute HF PEF, partly related to fluid overload.  He has subsequent acute hypoxic respiratory failure. - Patient was given 40 mg of IV Lasix. - IV fluids were held off. - Stat ABG was ordered and revealed a pH 7.33 and pCO2 36, pO2 of 75 and HCO3 19 with O2 sat 97.2% on 40% FiO2. - The patient was placed on BiPAP. - O2 protocol will be followed.  Authorized and performed by: Valente David, MD Total critical care time:   35     minutes. Due  to a high probability of clinically significant, life-threatening deterioration, the patient required my highest level of preparedness to intervene emergently and I personally spent this critical care time directly and personally managing the patient.  This critical care time included obtaining a history, examining the patient, pulse oximetry, ordering and review of studies, arranging urgent treatment with development of management plan, evaluation of patient's response to treatment, frequent reassessment, and discussions with other providers. This critical care time was performed to assess and manage the high probability of imminent, life-threatening deterioration that could result in multiorgan failure.  It was exclusive of separately billable procedures and treating other patients and teaching time.

## 2022-06-29 NOTE — Consult Note (Addendum)
Consultation Note Date: 06/29/2022 at 1045  Patient Name: Barry [name]  DOB: 1955/10/09  MRN: [id number]  Age / Sex: 67 y.o., male  PCP: [name], MD Referring Physician: Marcelino Duster, MD  Reason for Consultation: Establishing goals of care  HPI/Patient Profile: 66 y.o. male  with past medical history of decompensated cirrhosis with ascites and varices, CVA, type 2 diabetes, depression/anxiety, CKD stage III, and CAD admitted on 07/08/2022 from SNF with AMS.  Patient is being treated for hepatic encephalopathy, hyperbilirubinemia, acute blood loss anemia due to suspected GI bleed, sepsis secondary to SBP, and AKI.  Patient experienced acute hypoxic respiratory failure and was intubated on 6/4.  PMT was consulted to discuss goals of care.  Clinical Assessment and Goals of Care: I have reviewed medical records including EPIC notes, labs and imaging, assessed the patient and then counseled with CCM attending Dr. Jeralene Huff and NP Delton See.  They have discussed potential for patient to need intubation imminently with patient's only child/daughter Barry [name].  Barry [name] resides in [place] at the moment is attempting to come to visit her father.  After assessing the patient and speaking with CCM, I spoke with patient's daughter Barry [name] over the phone to discuss diagnosis prognosis, GOC, EOL wishes, disposition and options.  I introduced Palliative Medicine as specialized medical care for people living with serious illness. It focuses on providing relief from the symptoms and stress of a serious illness. The goal is to improve quality of life for both the patient and the family.  We discussed a brief life review of the patient. He worked in [employer] the majority of his adult career.  Barry [name] shares he like to tinker with things and was very good at being a Gaffer.  Patient has 1 brother and 1  sister still living.  Barry [name] currently resides in [place] but is planning to be in [place] by Saturday.  She endorses that her cousin [name] is a "standing" for her as she has been heavily involved in his care locally.   As far as functional and nutritional status Barry [name] is unsure of her father's status PTA.  She shares she last saw him in September 2023 and spoke with him at the end of April of this year.  She shares he did not want to share many details about his medical status.  We discussed patient's current illness and what it means in the larger context of patient's on-going co-morbidities. I attempted to elicit values and goals of care important to the patient. Barry [name] states she does not want to have her father suffer or be in pain.  However, she is not sure what to do since she is so far away and has not been able to see him.  The difference between aggressive medical intervention and comfort care was considered.  Barry [name] shares she wants to do what ever will cause her father the least amount of pain and suffering.  I outlined that being on a ventilator at end-of-life is in and of itself not comfortable.  She shares she agrees but she is just not ready to remove life-sustaining measures at this time.  Education offered regarding concept specific to human mortality and the limitations of medical interventions to prolong life when the body begins to fail to thrive.  Therapeutic silence, active listening, and emotional support provided.   Discussed with Barry [name] the importance of continued conversation with family and the medical providers regarding overall plan of care and treatment options, ensuring decisions are within the context of the patient's values and GOCs.    She references that she does not want to be selfish but is just not sure what is best for him at this time.  We discussed patient may pass away despite our medical efforts and use of ventilatory support.  She shares  understanding.  She is hopeful that she will be able to arrive to [facility] before patient passes but understands that she might not be able to be here before that time.  I assured her that if patient shows other forms of pain and suffering the medical team will be in contact with her to shift to full comfort measures and one-way extubation.  She shares she appreciates the support and would like any and all updates on patient's status.  We agreed to speak again tomorrow, 6/5.  Questions and concerns were addressed. PMT contact info given and Barry [name] was encouraged to call with questions or concerns.   Primary Decision Maker NEXT OF KIN  Physical Exam Vitals reviewed.  Constitutional:      General: He is in acute distress.     Appearance: He is ill-appearing.  Eyes:     Comments: Bruise to left eye, glass right eye  Cardiovascular:     Rate and Rhythm: Tachycardia present.  Pulmonary:     Effort: Respiratory distress present.     Comments: Sturggling to breath, HFNC in place Skin:    General: Skin is warm and dry.  Neurological:     Comments: nonverbal     Palliative Assessment/Data: 20%     Thank you for this consult. Palliative medicine will continue to follow and assist holistically.   Time Total: 50 minutes Greater than 50%  of this time was spent counseling and coordinating care related to the above assessment and plan.  Signed by: Georgiann Cocker, DNP, FNP-BC Palliative Medicine    Please contact Palliative Medicine Team phone at [phone] for questions and concerns.  For individual provider: See Loretha Stapler

## 2022-06-29 NOTE — Progress Notes (Signed)
PHARMACY - PHYSICIAN COMMUNICATION CRITICAL VALUE ALERT - BLOOD CULTURE IDENTIFICATION (BCID)  Barry [name] is an 67 y.o. male who presented to [facility] on 06/[facility]/2022 with a chief complaint of altered mental status with history of cirrhosis  Assessment:  6/1 blood culture reported GPR in 1 of 4 bottles today.  No BCID performed in this case.  On antibiotics for aspiration pneumonia and possible peritonitis (fluid analysis not consistent with peritonitis)   Name of physician (or Provider) Contacted: Zada Girt, NP  Current antibiotics: Ceftriaxone, metronidazole  Changes to prescribed antibiotics recommended:  Patient is on recommended antibiotics - No changes needed - monitor on current therapy.  Await final identification but current regimen covers many GPRs  No results found for this or any previous visit.  Juliette Alcide, PharmD, BCPS, BCIDP Work Cell: [phone] 06/29/2022 10:25 AM

## 2022-06-29 NOTE — Consult Note (Addendum)
NAME:  Barry [name], MRN:  [id number], DOB:  1955/07/24, LOS: 3 ADMISSION DATE:  06/26/2022, CONSULTATION DATE: 06/29/2022 REFERRING MD: Dr. Clide Dales, CHIEF COMPLAINT: Altered Mental Status    History of Present Illness:  This is a 67 yo male who presented to [facility] ER via EMS from an LTACH on 06/1 with altered mental status.  Per ER notes his last known well time was 7:00 am on 06/1 although he was somewhat drowsy.  He was later found altered at 1400 on 06/1, EMS notified.  EMS reported upon their arrival pt had a GCS of 13 with confusion, nonsensical speech, and unable to follow commands.  ED Course Upon arrival to the ER pt remained altered.  Significant ER results were: BUN 43/creatinine 1.47/calcium 7.8/albumin 2.0/AST 110/ammonia 92/total bilirubin 2.6/lactic acid 2.1/wbc 17.1/hgb 6.0/platelets 139/PT 21.1/INR 1.8/UA negative for UTI.  CXR concerning for atelectasis.  CT Head negative for acute intracranial abnormalities.  COVID-19/Influenza A&B/RSV negative.  Pt received 1.5L NS bolus, iv thiamine, ceftriaxone, flagyl, and 1 unit of pRBC's.  He was subsequently admitted to the [facility] unit per hospitalist team for additional workup and treatment.  See detailed hospital course below under significant events.  CT Head: No acute intracranial abnormalities.  Mild cerebral atrophy.  Air-fluid levels in the left mastoid air cells may indicate mastoiditis. No acute fractures are identified.  Pertinent  Medical History    Anemia     Anxiety     Arthritis     Cerebrovascular disease     Cervical disc disorder     Chronic kidney disease     Cirrhosis of liver (HCC)     Coronary artery disease     Depression     Dyspnea      with exertion   Esophageal varices (HCC)      hx of   Family history of colon cancer 10/15/2019   GERD (gastroesophageal reflux disease)     Headache     Hyperlipidemia     Hyperlipidemia     Hypocalcemia     MI (myocardial infarction) (HCC) 04/23/2007    inferior wall    Morbid obesity (HCC) 06/26/2012   Neuropathy      secondary to diabetes   Pancreatitis      hs of   S/P CABG x 3 07/04/2012    LIMA to LAD, SVG to D1, SVG to PDA, EVH via right thigh   Sarcoidosis of skin     Sleep apnea      no cpap   Stroke (HCC)      small stroke - 2023 - legs weak , righ tleg weak   Type II or unspecified type diabetes mellitus without mention of complication, not stated as uncontrolled     Unspecified essential hypertension    Significant Hospital Events: Including procedures, antibiotic start and stop dates in addition to other pertinent events   06/1: Pt admitted to the [facility] unit with hepatic encephalopathy due to decompensated cirrhosis, hyperbilirubinemia, acute blood loss anemia due to suspected GI bleed, sepsis secondary to SBP, and AKI 06/2: GI consulted pt noted to have large melenic stools recommended IV PPI and octreotide gtt and transfuse blood products gently due to concern of variceal bleeding with plans for EGD when clinically feasible  06/3: Pt underwent paracentesis per IR that yielded 3 liters of yellow fluid 06/4: Pt transferred to the stepdown unit with worsening acute hypoxic respiratory failure secondary to pulmonary edema and suspected aspiration requiring Bipap.  PCCM  team consulted to assist with management   Micro Data:   06/1: Blood x2>>gram negative rods  06/1: COVID-19/Influenza A&B/RSV>>negative  06/2: MRSA PCR>>negative  06/3: Peritoneal fluid culture>>no wbc or organisms seen thus far   Anti-infectives (From admission, onward)    Start     Dose/Rate Route Frequency Ordered Stop   06/27/22 1700  cefTRIAXone (ROCEPHIN) 2 g in sodium chloride 0.9 % 100 mL IVPB        2 g 200 mL/hr over 30 Minutes Intravenous Every 24 hours 06/26/22 2243 07/01/22 1659   06/27/22 1000  tenofovir (VIREAD) tablet 300 mg  Status:  Discontinued        300 mg Oral Daily 06/26/22 2225 06/28/22 1509   06/27/22 0600  metroNIDAZOLE (FLAGYL) IVPB 500 mg         500 mg 100 mL/hr over 60 Minutes Intravenous Every 12 hours 06/26/22 2243 07/01/22 0559   06/26/22 1715  cefTRIAXone (ROCEPHIN) 2 g in sodium chloride 0.9 % 100 mL IVPB        2 g 200 mL/hr over 30 Minutes Intravenous  Once 06/26/22 1713 06/26/22 1824   06/26/22 1715  metroNIDAZOLE (FLAGYL) IVPB 500 mg        500 mg 100 mL/hr over 60 Minutes Intravenous  Once 06/26/22 1713 06/26/22 1954      Interim History / Subjective:  Pt currently on Bipap with increased work of breathing.  Has intermittent agitation but unable to follow commands   Objective   Blood pressure (!) 98/51, pulse (!) 106, temperature 98.4 F (36.9 C), temperature source Axillary, resp. rate (!) 28, height 5\' 4"  (1.626 m), weight 104.7 kg, SpO2 98 %.    FiO2 (%):  [40 %] 40 %   Intake/Output Summary (Last 24 hours) at 06/29/2022 0727 Last data filed at 06/29/2022 0600 Gross per 24 hour  Intake 2750.76 ml  Output 900 ml  Net 1850.76 ml   Filed Weights   06/26/22 1717 06/28/22 0141  Weight: 107.7 kg 104.7 kg   Examination: General: Acute on chronically-ill appearing male, mild respiratory distress on Bipap  HENT: Supple, difficult to assess for JVD due to Bipap  Lungs: Diffuse crackles throughout, slightly tachypneic  Cardiovascular: Sinus tachycardia, s1s2, no r/g, 2+ radial/2+ distal pulses, 2+ generalized edema  Abdomen: +BS x4, obese, soft, non distended, non tender  Extremities: Moves extremities Neuro: Intermittently agitated, nonverbal at this time, not following commands, left periorbital ecchymosis, left pupil round/reactive  GU: Male external catheter in place draining clear/yellow urine   Resolved Hospital Problem list     Assessment & Plan:  #Acute hypoxic respiratory failure secondary to pulmonary edema and suspected aspiration  Hx: OSA  - Supplemental O2 for dyspnea and/or hypoxia  - Maintain O2 sats >90% - Will avoid Bipap for now given high risk for aspiration due to mentation - Scheduled  and prn bronchodilator therapy - Diurese as bp and renal function permits  - HIGH RISK FOR INTUBATION   #HFrEF  #Elevated troponin likely secondary to demand ischemia  Hx: CAD, CVA, HLD, inferior MI, HTN, CABG x3 Echo 04/09/22: EF >75%; indeterminate diastolic filling due to E-A fusion, mild tricuspid valve regurgitation  - Continuous telemetry monitoring  - Maintain map >65   - Hold outpatient aspirin and beta-blocker therapy  - Will check troponin  - Will check limited Echo   #Acute kidney injury with anion gap metabolic acidosis secondary to ATN  - Trend BMP  - Replace electrolytes as indicated  -  Monitor UOP - Avoid nephrotoxic medications when able  - Diurese as bp and renal function permits  - Will give 2 amps of sodium bicarb   #Decompensated cirrhosis  #Acute blood loss anemia secondary to GI bleed  #Thrombocytopenia  #Ascites  #Hyperbilirubinemia  Hx: Grade II esophageal varices and port hypertensive gastropathy  - Trend CBC - Monitor for s/sx of bleeding  - Transfuse for hgb <7 and platelets count of <20 and/or signs of bleeding  - GI consulted appreciate input: recommending EGD once encephalopathy improves, but will perform urgently if pt develops overt hemorrhage and keep blood/iv fluid infusion rates low to avoid increasing portal pressures that would lead to variceal rupture  - Continue PPI and octreotide gtt for now  - [place] RUQ pending   #Sepsis  #Gram negative rods in blood cultures  #Possible SBP  - Trend WBC and monitor fever curve  - Trend PCT  - Follow cultures  - Continue abx as outlined above - GI consulted appreciate input   #Type II diabetes mellitus  - CBG's q4hrs  - SSI   #Hepatic encephalopathy  - Continue lactulose enema's  - If pt develops worsening agitation/delirium will start precedex gtt and will avoid benzo's  - Avoid sedating medication when able  - Will start high dose thiamine for 4 days followed by 100 mg iv thiamine daily  -  Once respiratory status improves will need MRI Brain  - Frequent reorientation   Best Practice (right click and "Reselect all SmartList Selections" daily)   Diet/type: NPO DVT prophylaxis: SCD GI prophylaxis: PPI Lines: RUQ PICC Line  Foley:  N/A Code Status:  full code Last date of multidisciplinary goals of care discussion [06/29/2022]  Labs   CBC: Recent Labs  Lab 06/26/22 1819 06/27/22 0550 06/27/22 1513 06/27/22 1820 06/28/22 0337 06/28/22 1620 06/29/22 0414  WBC 17.2* 9.9 9.9 10.8* 10.5  --  6.4  NEUTROABS 14.3*  --   --   --   --   --   --   HGB 6.0* 5.5* 8.0* 7.9* 7.4* 6.3* 7.2*  HCT 19.1* 17.3* 25.0* 24.2* 22.7* 19.5* 22.6*  MCV 101.1* 96.1 96.2 96.8 96.2  --  100.4*  PLT 139* 100* 81* 78* 62*  --  44*    Basic Metabolic Panel: Recent Labs  Lab 06/26/22 1721 06/27/22 0550 06/28/22 0337 06/29/22 0414  NA 135 139 145 144  K 5.1 4.4 3.4* 3.6  CL 100 104 109 105  CO2 22 22 21* 18*  GLUCOSE 131* 110* 91 188*  BUN 43* 44* 40* 37*  CREATININE 1.47* 1.40* 1.35* 1.39*  CALCIUM 7.8* 7.4* 7.4* 8.5*   GFR: Estimated Creatinine Clearance: 58 mL/min (A) (by C-G formula based on SCr of 1.39 mg/dL (H)). Recent Labs  Lab 06/26/22 1723 06/26/22 1819 06/26/22 2116 06/27/22 0550 06/27/22 1513 06/27/22 1820 06/28/22 0337 06/29/22 0414  WBC  --    < >  --    < > 9.9 10.8* 10.5 6.4  LATICACIDVEN 2.1*  --  1.9  --   --   --   --   --    < > = values in this interval not displayed.    Liver Function Tests: Recent Labs  Lab 06/26/22 1721 06/27/22 0550 06/28/22 0337 06/29/22 0414  AST 110* 88* 97* 125*  ALT 42 35 38 49*  ALKPHOS 120 92 91 92  BILITOT 2.6* 2.7* 2.8* 2.7*  PROT 6.4* 5.9* 5.4* 6.3*  ALBUMIN 2.0* 2.5* 2.3* 3.3*  No results for input(s): "LIPASE", "AMYLASE" in the last 168 hours. Recent Labs  Lab 06/26/22 1819 06/28/22 0337 06/29/22 0414  AMMONIA 92* 21 48*    ABG    Component Value Date/Time   PHART 7.33 (L) 06/29/2022 0419    PCO2ART 36 06/29/2022 0419   PO2ART 75 (L) 06/29/2022 0419   HCO3 19.0 (L) 06/29/2022 0419   TCO2 25 07/04/2012 2031   ACIDBASEDEF 6.2 (H) 06/29/2022 0419   O2SAT 97.2 06/29/2022 0419     Coagulation Profile: Recent Labs  Lab 06/26/22 1721 06/27/22 1513  INR 1.8* 1.7*    Cardiac Enzymes: No results for input(s): "CKTOTAL", "CKMB", "CKMBINDEX", "TROPONINI" in the last 168 hours.  HbA1C: Hgb A1c MFr Bld  Date/Time Value Ref Range Status  05/06/2022 10:51 AM 5.8 (H) 4.8 - 5.6 % Final    Comment:    (NOTE) Pre diabetes:          5.7%-6.4%  Diabetes:              >6.4%  Glycemic control for   <7.0% adults with diabetes   03/01/2022 02:00 PM 6.7 (H) 4.6 - 6.5 % Final    Comment:    Glycemic Control Guidelines for People with Diabetes:Non Diabetic:  <6%Goal of Therapy: <7%Additional Action Suggested:  >8%     CBG: Recent Labs  Lab 06/28/22 1113 06/28/22 1623 06/28/22 1957 06/28/22 2338 06/29/22 0345  GLUCAP 79 109* 118* 130* 149*    Review of Systems:   Unable to assess pt nonverbal at this time   Past Medical History:  He,  has a past medical history of Anemia, Anxiety, Arthritis, Cerebrovascular disease, Cervical disc disorder, Chronic kidney disease, Cirrhosis of liver (HCC), Coronary artery disease, Depression, Dyspnea, Esophageal varices (HCC), Family history of colon cancer (10/15/2019), GERD (gastroesophageal reflux disease), Headache, Hyperlipidemia, Hyperlipidemia, Hypocalcemia, MI (myocardial infarction) (HCC) (04/23/2007), Morbid obesity (HCC) (06/26/2012), Neuropathy, Pancreatitis, S/P CABG x 3 (07/04/2012), Sarcoidosis of skin, Sleep apnea, Stroke (HCC), Type II or unspecified type diabetes mellitus without mention of complication, not stated as uncontrolled, and Unspecified essential hypertension.   Surgical History:   Past Surgical History:  Procedure Laterality Date   CORONARY ANGIOPLASTY WITH STENT PLACEMENT  04/23/2007   PCI and stenting of mid  RCA - Dr Bary Castilla @ [facility]   CORONARY ARTERY BYPASS GRAFT N/A 07/04/2012   Procedure: CORONARY ARTERY BYPASS GRAFTING (CABG);  Surgeon: [name], MD;  Location: [facility] OR;  Service: Open Heart Surgery;  Laterality: N/A;  x3 using right greater saphenous vein and left internal mammary.    ESOPHAGOGASTRODUODENOSCOPY (EGD) WITH PROPOFOL N/A 06/01/2022   Procedure: ESOPHAGOGASTRODUODENOSCOPY (EGD) WITH PROPOFOL;  Surgeon: Meryl Dare, MD;  Location: [facility] ENDOSCOPY;  Service: Gastroenterology;  Laterality: N/A;   INTRAOPERATIVE TRANSESOPHAGEAL ECHOCARDIOGRAM N/A 07/04/2012   Procedure: INTRAOPERATIVE TRANSESOPHAGEAL ECHOCARDIOGRAM;  Surgeon: [name], MD;  Location: [facility] OR;  Service: Open Heart Surgery;  Laterality: N/A;   LEFT HEART CATH AND CORS/GRAFTS ANGIOGRAPHY N/A 04/18/2017   Procedure: LEFT HEART CATH AND CORS/GRAFTS ANGIOGRAPHY;  Surgeon: [name], MD;  Location: [facility] INVASIVE CV LAB;  Service: Cardiovascular;  Laterality: N/A;   LEFT HEART CATHETERIZATION WITH CORONARY ANGIOGRAM N/A 06/25/2012   Procedure: LEFT HEART CATHETERIZATION WITH CORONARY ANGIOGRAM;  Surgeon: [name], MD;  Location: [facility] CATH LAB;  Service: Cardiovascular;  Laterality: N/A;   TOOTH EXTRACTION  04/2018   4 teeth pulled      Social History:   reports that he has  never smoked. He has never used smokeless tobacco. He reports that he does not drink alcohol and does not use drugs.   Family History:  His family history includes Cancer in his father, maternal aunt, mother, and sister; Colon cancer in his brother and brother; Diabetes in his brother and sister.   Allergies No Known Allergies   Home Medications  Prior to Admission medications   Medication Sig Start Date End Date Taking? Authorizing Provider  acetaminophen (TYLENOL) 500 MG tablet Take 1 tablet (500 mg total) by mouth every 8 (eight) hours as needed for moderate pain. 04/24/22  Yes [name], MD  albuterol (PROVENTIL HFA;VENTOLIN  HFA) 108 [phone] Base) MCG/ACT inhaler Inhale 2 puffs into the lungs every 6 (six) hours as needed for wheezing or shortness of breath. 04/21/17  Yes Kirt Boys, DO  aspirin 81 MG chewable tablet Chew 81 mg by mouth daily.   Yes [provider]  atorvastatin (LIPITOR) 80 MG tablet Take 1 tablet (80 mg total) by mouth daily. Patient taking differently: Take 80 mg by mouth every evening. 05/08/22  Yes [name], MD  busPIRone (BUSPAR) 15 MG tablet TAKE 1 TABLET BY MOUTH THREE TIMES DAILY FOR ANXIETY Patient taking differently: Take 15 mg by mouth 3 (three) times daily. 07/19/17  Yes Montez Morita, Monica, DO  carvedilol (COREG) 6.25 MG tablet Take 1 tablet (6.25 mg total) by mouth daily at 12 noon. 06/06/22  Yes Gherghe, Daylene Katayama, MD  cyclobenzaprine (FLEXERIL) 10 MG tablet Take 10 mg by mouth at bedtime. May take an additional 10 mg up to twice daily as needed for pain related to RIGHT HIP, DO NOT GIVE WITHIN 6 HOURS OF SCHEDULED DOSE 07/23/19  Yes [provider]  DULoxetine (CYMBALTA) 20 MG capsule Take 20 mg by mouth daily.   Yes [provider]  FEROSUL 325 (65 Fe) MG tablet Take 325 mg by mouth daily. 10/13/21  Yes [provider]  fluticasone-salmeterol (ADVAIR) 100-50 MCG/ACT AEPB Inhale 1 puff into the lungs 2 (two) times daily.   Yes [provider]  furosemide (LASIX) 40 MG tablet Take 40 mg by mouth daily.   Yes [provider]  gabapentin (NEURONTIN) 300 MG capsule Take 300 mg by mouth in the morning and at bedtime. 09/12/19  Yes [provider]  ipratropium-albuterol (DUONEB) 0.5-2.5 (3) MG/3ML SOLN Take 3 mLs by nebulization every 6 (six) hours as needed.   Yes [provider]  lactulose (CHRONULAC) 10 GM/15ML solution Take 15 mLs (10 g total) by mouth 3 (three) times daily. 04/24/22  Yes [name], MD  metFORMIN (GLUCOPHAGE) 1000 MG tablet TAKE 1 TABLET(1000 MG) BY MOUTH TWICE DAILY Patient taking differently:  Take 1,000 mg by mouth 2 (two) times daily with a meal. 09/09/17  Yes Kirt Boys, DO  oxyCODONE-acetaminophen (PERCOCET) 10-325 MG tablet Take 1 tablet by mouth every 8 (eight) hours as needed for pain. 06/05/22  Yes Gherghe, Daylene Katayama, MD  pantoprazole (PROTONIX) 40 MG tablet Take 1 tablet (40 mg total) by mouth 2 (two) times daily before a meal. TAKE 1 TABLET BY MOUTH EVERY DAY 06/05/22  Yes Gherghe, Daylene Katayama, MD  potassium chloride (KLOR-CON) 10 MEQ tablet Take 10 mEq by mouth 2 (two) times daily.   Yes [provider]  sertraline (ZOLOFT) 100 MG tablet TAKE 2 TABLETS(200 MG) BY MOUTH DAILY Patient taking differently: Take 200 mg by mouth daily. 04/20/19  Yes Sharon Seller, NP  tenofovir (VIREAD) 300 MG  tablet Take 1 tablet (300 mg total) by mouth daily. 06/06/22  Yes Leatha Gilding, MD  thiamine (VITAMIN B-1) 100 MG tablet Take 1 tablet (100 mg total) by mouth daily. 03/06/22  Yes [name], MD  Blood Glucose Monitoring Suppl (CONTOUR NEXT EZ MONITOR) w/Device KIT Test blood sugar three times daily E11.22 03/08/16   Edison Pace, RPH-CPP  cetirizine (ZYRTEC) 10 MG tablet Take 10 mg by mouth daily.    [provider]     Critical care time: 70 minutes     Zada Girt, AGNP  Pulmonary/Critical Care Pager [phone] (please enter 7 digits) PCCM Consult Pager [phone] (please enter 7 digits)

## 2022-06-29 NOTE — Progress Notes (Signed)
[facility] GI inpatient brief Progress Note  Patient seen for f/u hepatic encephalopathy, Melena. Patient with respiratory failure presumably from fluid overload requiring Intensive care consult for intubation and mechanical ventilation. Patient seen by Palliative care medicine. Patient intubated and on circulatory support.   Vitals:   06/29/22 1700 06/29/22 1800  BP: (!) 98/48 (!) 88/44  Pulse: (!) 108 (!) 104  Resp: (!) 22 20  Temp:    SpO2: 100% 100%       Labs:    Latest Ref Rng & Units 06/29/2022    4:26 PM 06/29/2022    4:14 AM 06/28/2022    4:20 PM  CBC  WBC 4.0 - 10.5 K/uL 7.6  6.4    Hemoglobin 13.0 - 17.0 g/dL 7.1  7.2  6.3   Hematocrit 39.0 - 52.0 % 22.6  22.6  19.5   Platelets 150 - 400 K/uL 41  44      CMP     Component Value Date/Time   NA 144 06/29/2022 0414   NA 139 04/14/2017 0824   K 3.6 06/29/2022 0414   CL 105 06/29/2022 0414   CO2 18 (L) 06/29/2022 0414   GLUCOSE 188 (H) 06/29/2022 0414   BUN 37 (H) 06/29/2022 0414   BUN 19 04/14/2017 0824   CREATININE 1.39 (H) 06/29/2022 0414   CREATININE 1.13 02/27/2020 0906   CREATININE 0.93 03/26/2019 0833   CALCIUM 8.5 (L) 06/29/2022 0414   PROT 6.3 (L) 06/29/2022 0414   PROT 7.3 05/30/2015 0859   ALBUMIN 3.3 (L) 06/29/2022 0414   ALBUMIN 4.3 05/30/2015 0859   AST 125 (H) 06/29/2022 0414   AST 39 02/27/2020 0906   ALT 49 (H) 06/29/2022 0414   ALT 29 02/27/2020 0906   ALKPHOS 92 06/29/2022 0414   BILITOT 2.7 (H) 06/29/2022 0414   BILITOT 0.5 02/27/2020 0906   GFRNONAA 56 (L) 06/29/2022 0414   GFRNONAA >60 02/27/2020 0906   GFRNONAA 88 03/26/2019 0833   GFRAA >60 10/29/2019 1331   GFRAA 102 03/26/2019 0833      Impression:  UGI bleed - Stable currently on IV protonix, octreotide 2.   Respiratory failure - on ventilatory support 3.   Sepsis - on antibiotic coverage for possible SBP 4.   Ascites 5. Child Pugh Class "C" Cirrhosis. MELD = 21.   Plan:   Continue lactulose per rectal tube 2.     ICU  care as ordered. 3.    Following. No new recs.    Thank you  T. Bing Plume, M.D. ABIM Diplomate in Gastroenterology [facility] [phone] - Cell

## 2022-06-29 NOTE — Procedures (Signed)
Endotracheal Intubation: Patient required placement of an artificial airway secondary to acute hypoxic respiratory failure    Consent: Emergent.    Hand washing performed prior to starting the procedure.    Medications administered for sedation prior to procedure:  Fentanyl 50 mcg IV, 20 mg Etomidate IV, Rocuronium 100 mg IV     A time out procedure was called and correct patient, name, & ID confirmed. Needed supplies and equipment were assembled and checked to include ETT, 10 ml syringe, Glidescope, Mac and Miller blades, suction, oxygen and bag mask valve, end tidal CO2 monitor.    Patient was positioned to align the mouth and pharynx to facilitate visualization of the glottis.    Heart rate, SpO2 and blood pressure was continuously monitored during the procedure. Pre-oxygenation was conducted prior to intubation and endotracheal tube was placed through the vocal cords into the trachea.       The artificial airway was placed under direct visualization via glidescope route using a 8.0 ETT on the first attempt.   ETT was secured at 24 cm.   Placement was confirmed by auscuitation of lungs with good breath sounds bilaterally and no stomach sounds.  Condensation was noted on endotracheal tube.   Pulse ox 97%  CO2 detector in place with appropriate color change.    Complications: None .        Chest radiograph ordered and pending.   Zada Girt, AGNP  Pulmonary/Critical Care Pager [phone] (please enter 7 digits) PCCM Consult Pager [phone] (please enter 7 digits)

## 2022-06-30 DIAGNOSIS — K652 Spontaneous bacterial peritonitis: Secondary | ICD-10-CM | POA: Diagnosis not present

## 2022-06-30 DIAGNOSIS — B169 Acute hepatitis B without delta-agent and without hepatic coma: Secondary | ICD-10-CM

## 2022-06-30 DIAGNOSIS — R4182 Altered mental status, unspecified: Secondary | ICD-10-CM | POA: Diagnosis not present

## 2022-06-30 DIAGNOSIS — K7682 Hepatic encephalopathy: Secondary | ICD-10-CM | POA: Diagnosis not present

## 2022-06-30 DIAGNOSIS — D62 Acute posthemorrhagic anemia: Secondary | ICD-10-CM

## 2022-06-30 DIAGNOSIS — K766 Portal hypertension: Secondary | ICD-10-CM | POA: Diagnosis not present

## 2022-06-30 LAB — RENAL FUNCTION PANEL
Albumin: 2.7 g/dL — ABNORMAL LOW (ref 3.5–5.0)
Anion gap: 10 (ref 5–15)
BUN: 29 mg/dL — ABNORMAL HIGH (ref 8–23)
CO2: 26 mmol/L (ref 22–32)
Calcium: 7.6 mg/dL — ABNORMAL LOW (ref 8.9–10.3)
Chloride: 109 mmol/L (ref 98–111)
Creatinine, Ser: 1.27 mg/dL — ABNORMAL HIGH (ref 0.61–1.24)
GFR, Estimated: >60 mL/min (ref >60)
Glucose, Bld: 140 mg/dL — ABNORMAL HIGH (ref 70–99)
Phosphorus: 2.5 mg/dL (ref 2.5–4.6)
Potassium: 2.9 mmol/L — ABNORMAL LOW (ref 3.5–5.1)
Sodium: 145 mmol/L (ref 135–145)

## 2022-06-30 LAB — TYPE AND SCREEN
ABO/RH(D): A POS
Antibody Screen: NEGATIVE
Unit division: 0
Unit division: 0
Unit division: 0

## 2022-06-30 LAB — BPAM RBC
Blood Product Expiration Date: 202406272359
Blood Product Expiration Date: 202406142359
ISSUE DATE / TIME: 202406012239
ISSUE DATE / TIME: 202406020856
Unit Type and Rh: 6200
Unit Type and Rh: 6200
Unit Type and Rh: 6200
Unit Type and Rh: 6200

## 2022-06-30 LAB — CBC WITH DIFFERENTIAL/PLATELET
Abs Immature Granulocytes: 0.01 10*3/uL (ref 0.00–0.07)
Basophils Absolute: 0 10*3/uL (ref 0.0–0.1)
Basophils Relative: 0 %
Eosinophils Absolute: 0.1 10*3/uL (ref 0.0–0.5)
Eosinophils Relative: 3 %
HCT: 20.6 % — ABNORMAL LOW (ref 39.0–52.0)
Hemoglobin: 6.4 g/dL — ABNORMAL LOW (ref 13.0–17.0)
Immature Granulocytes: 0 %
Lymphocytes Relative: 17 %
Lymphs Abs: 0.9 10*3/uL (ref 0.7–4.0)
MCH: 31.1 pg (ref 26.0–34.0)
MCHC: 31.1 g/dL (ref 30.0–36.0)
MCV: 100 fL (ref 80.0–100.0)
Monocytes Absolute: 0.4 10*3/uL (ref 0.1–1.0)
Monocytes Relative: 8 %
Neutro Abs: 3.6 10*3/uL (ref 1.7–7.7)
Neutrophils Relative %: 72 %
Platelets: 34 10*3/uL — ABNORMAL LOW (ref 150–400)
RBC: 2.06 MIL/uL — ABNORMAL LOW (ref 4.22–5.81)
RDW: 20.3 % — ABNORMAL HIGH (ref 11.5–15.5)
WBC: 5 10*3/uL (ref 4.0–10.5)
nRBC: 0.4 % — ABNORMAL HIGH (ref 0.0–0.2)

## 2022-06-30 LAB — PREPARE RBC (CROSSMATCH)

## 2022-06-30 LAB — ECHOCARDIOGRAM LIMITED
AV Mean grad: 8.8 mmHg
AV Peak grad: 15.1 mmHg
Ao pk vel: 1.94 m/s
Area-P 1/2: 4.4 cm2
S' Lateral: 3 cm
Weight: 3693.15 oz

## 2022-06-30 LAB — CULTURE, BLOOD (ROUTINE X 2)

## 2022-06-30 LAB — HEMOGLOBIN AND HEMATOCRIT, BLOOD
HCT: 22.5 % — ABNORMAL LOW (ref 39.0–52.0)
HCT: 22.5 % — ABNORMAL LOW (ref 39.0–52.0)
Hemoglobin: 7.3 g/dL — ABNORMAL LOW (ref 13.0–17.0)
Hemoglobin: 7.3 g/dL — ABNORMAL LOW (ref 13.0–17.0)

## 2022-06-30 LAB — MAGNESIUM: Magnesium: 2.2 mg/dL (ref 1.7–2.4)

## 2022-06-30 LAB — POTASSIUM: Potassium: 3.6 mmol/L (ref 3.5–5.1)

## 2022-06-30 LAB — GLUCOSE, CAPILLARY
Glucose-Capillary: 101 mg/dL — ABNORMAL HIGH (ref 70–99)
Glucose-Capillary: 105 mg/dL — ABNORMAL HIGH (ref 70–99)
Glucose-Capillary: 108 mg/dL — ABNORMAL HIGH (ref 70–99)
Glucose-Capillary: 117 mg/dL — ABNORMAL HIGH (ref 70–99)
Glucose-Capillary: 119 mg/dL — ABNORMAL HIGH (ref 70–99)
Glucose-Capillary: 127 mg/dL — ABNORMAL HIGH (ref 70–99)

## 2022-06-30 LAB — AEROBIC/ANAEROBIC CULTURE W GRAM STAIN (SURGICAL/DEEP WOUND)

## 2022-06-30 MED ORDER — SODIUM CHLORIDE 0.9% IV SOLUTION: Freq: Once | INTRAVENOUS | Status: AC

## 2022-06-30 MED ORDER — POTASSIUM CHLORIDE 10 MEQ/50ML IV SOLN
10.0000 meq | INTRAVENOUS | Status: AC
  Administered 2022-06-30 (×6): 10 meq via INTRAVENOUS
  Filled 2022-06-30 (×6): qty 50

## 2022-06-30 NOTE — Progress Notes (Signed)
GI Inpatient Follow-up Note  Subjective:  Patient seen in follow-up for GI bleed and hepatic encephalopathy. No acute events overnight. Hemoglobin 6.4 this morning on labs. No signs of overt GI bleeding per nursing report. He has rectal tube in place with dark green output. No family at bedside. Patient is intubated and sedated.   Scheduled Inpatient Medications:   sodium chloride   Intravenous Once   sodium chloride   Intravenous Once   Chlorhexidine Gluconate Cloth  6 each Topical Q0600   insulin aspart  0-6 Units Subcutaneous Q4H   lactulose  300 mL Rectal Q6H   mupirocin ointment  1 Application Nasal BID   pantoprazole  40 mg Intravenous Q12H   sodium chloride flush  10-40 mL Intracatheter Q12H   sodium chloride flush  3 mL Intravenous Q12H   [START ON ] thiamine (VITAMIN B1) injection  100 mg Intravenous Daily    Continuous Inpatient Infusions:    sodium chloride Stopped (06/27/22 0033)   sodium chloride Stopped (06/30/22 0513)   fentaNYL infusion INTRAVENOUS 150 mcg/hr (06/30/22 0731)   norepinephrine (LEVOPHED) Adult infusion Stopped (06/30/22 0158)   octreotide (SANDOSTATIN) 500 mcg in sodium chloride 0.9 % 250 mL (2 mcg/mL) infusion 50 mcg/hr (06/30/22 0731)   thiamine (VITAMIN B1) injection 500 mg (06/30/22 1045)    PRN Inpatient Medications:  fentaNYL, ipratropium-albuterol, midazolam, ondansetron **OR** ondansetron (ZOFRAN) IV, sodium chloride flush  Review of Systems: Constitutional: Weight is stable.  Eyes: No changes in vision. ENT: No oral lesions, sore throat.  GI: see HPI.  Heme/Lymph: No easy bruising.  CV: No chest pain.  GU: No hematuria.  Integumentary: No rashes.  Neuro: No headaches.  Psych: No depression/anxiety.  Endocrine: No heat/cold intolerance.  Allergic/Immunologic: No urticaria.  Resp: No cough, SOB.  Musculoskeletal: No joint swelling.    Physical Examination: BP (!) 92/52   Pulse (!) 108   Temp 99.2 F (37.3 C)   Resp  (!) 0   Ht 5\' 4"  (1.626 m)   Wt 103.7 kg   SpO2 94%   BMI 39.24 kg/m  Gen: acute distress, ill-appearing, intubated and sedated  HEENT: normocephalic, bruise to left eye Neck: supple, no JVD or thyromegaly Chest: Course breath sounds bilaterally CV: tachycardic, no m/g/c/r Abd: soft, NT, ND, +BS in all four quadrants; no HSM, guarding, ridigity, or rebound tenderness Ext: no edema, skin warm and dry Skin: no rash or lesions noted Lymph: no LAD  Data: Lab Results  Component Value Date   WBC 5.0 06/30/2022   HGB 6.4 (L) 06/30/2022   HCT 20.6 (L) 06/30/2022   MCV 100.0 06/30/2022   PLT 34 (L) 06/30/2022   Recent Labs  Lab 06/29/22 0414 06/29/22 1626 06/30/22 0409  HGB 7.2* 7.1* 6.4*   Lab Results  Component Value Date   NA 145 06/30/2022   K 2.9 (L) 06/30/2022   CL 109 06/30/2022   CO2 26 06/30/2022   BUN 29 (H) 06/30/2022   CREATININE 1.27 (H) 06/30/2022   Lab Results  Component Value Date   ALT 49 (H) 06/29/2022   AST 125 (H) 06/29/2022   ALKPHOS 92 06/29/2022   BILITOT 2.7 (H) 06/29/2022   Recent Labs  Lab 06/26/22 1721 06/27/22 1513  APTT 22*  --   INR 1.8* 1.7*    Assessment/Plan:  67 y/o Caucasian male with a PMH of decompensated hepatic cirrhosis c/b abdominal ascites and EV, morbid obesity (BMI 40), HTN, HLD, OA, hx of CVA, T2DM, CAD s/p CABG  x3 s/p hx of MI with stent placement 03/2007, OSA, depression, anxiety, CKD Stage III presented to the [facility] ED via EMS from SNF 6/1 for chief complaint of altered mental status. GI consulted for further evaluation and management of acute blood loss anemia.    Upper GI bleed - hemoglobin 6.4 this morning. No signs of overt gastrointestinal bleeding.   Acute respiratory failure - on ventilatory support   Decompensated cirrhosis of the liver c/b ascites, EV, and HE - MELD 3.0 21   Hepatic encephalopathy   Acute HBV - recently diagnosed last month during hospitalization, on tenofovir   Severe sepsis     AKI   T2DM   CAD   Morbid obesity   DNR  Recommendations:  - Maintain 2 large bore IVs for access - Transfuse 1 unit pRBC this morning as ordered - Continue to monitor serial H&H. Transfuse for Hgb <7.0.  - Continue Octreotide gtt, Protonix gtt, and Ceftriaxone 2 grams IV q24hr - Continue to monitor for signs of active GI bleeding - No signs of overt GI bleeding at this time - Defer any endoscopic evaluation at this time in light of no overt GI bleeding - Lactulose enemas q6h as ordered. Continue to monitor mental status closely. Titrate to goal 3-4 loose bowel movements daily.  - Hold antiplatelet/anticoagulation - Avoid hepatotoxins  - Continue management of comorbidities per primary team  - EGD when clinically feasible, ideally after mental status improves - NPO - Continue recs per palliative care. Prognosis is guarded.  - GI following along with you   Please call with questions or concerns.  Jacob Moores, PA-C [facility] [phone]

## 2022-06-30 NOTE — Progress Notes (Deleted)
                                                     Palliative Care Progress Note, Assessment & Plan   Patient Name: Barry [name]       Date: 06/30/2022 DOB: 12-14-1955  Age: 67 y.o. MRN#: [id number] Attending Physician: Erin Fulling, MD Primary Care Physician: [name], MD Admit Date: 06/26/2022  Subjective: Patient is intubated and unable to have meaningful interaction at this time.  Patient's girlfriend is at bedside.  HPI: 67 y.o. male  with past medical history of decompensated cirrhosis with ascites and varices, CVA, type 2 diabetes, depression/anxiety, CKD stage III, and CAD admitted on 06/26/2022 from SNF with AMS.   Patient is being treated for hepatic encephalopathy, hyperbilirubinemia, acute blood loss anemia due to suspected GI bleed, sepsis secondary to SBP, and AKI.   Patient experienced acute hypoxic respiratory failure and was intubated on 6/4.   PMT was consulted to discuss goals of care.  Summary of counseling/coordination of care: After reviewing the patient's chart and assessing the patient at bedside, I spoke with patient's girlfriend/HCPOA Barry [name] in regards to plan and goals of care.  She endorses patient had some purposeful movements on the ventilator but that overall he is not able to "be himself anymore".  Therapeutic silence, active listening, and emotional support provided.  Barry [name] shares she is going to leave for her father's funeral late this evening.  She says patient will be "upset" if he wakes up and she is not at bedside. She plans to return either Friday night or Saturday morning.  I discussed patient's plan of care with attending CCM Dr. Jeralene Huff.  As per his discussions with Barry [name].  Plan is for one-way extubation on Saturday once Barry [name] is at bedside.  PMT will continue to follow and support patient and family  throughout this hospitalization.  Physical Exam Vitals reviewed.  Cardiovascular:     Rate and Rhythm: Tachycardia present.  Pulmonary:     Comments: MV Abdominal:     Palpations: Abdomen is soft.  Musculoskeletal:     Right lower leg: Edema present.     Left lower leg: Edema present.  Skin:    General: Skin is warm and dry.     Coloration: Skin is pale.             Total Time 25 minutes   Waldine Zenz L. Manon Hilding, FNP-BC Palliative Medicine Team Team Phone # [phone]

## 2022-06-30 NOTE — Progress Notes (Signed)
Initial Nutrition Assessment  DOCUMENTATION CODES:   Obesity unspecified  INTERVENTION:   -If TF is desired, recommend:  Initiate Vital 1.2 @ 20 ml/hr and increase by 10 ml every 4 hours to goal rate of 40 ml/hr.   60 ml Prosource TF TID  30 ml free water flush to maintain tube patency  Tube feeding regimen provides 1392 kcal (100% of needs), 122 grams of protein, and 779 ml of H2O. Total free water: 959 ml daily  NUTRITION DIAGNOSIS:   Inadequate oral intake related to inability to eat as evidenced by NPO status.  GOAL:   Provide needs based on ASPEN/SCCM guidelines  MONITOR:   Vent status  REASON FOR ASSESSMENT:   Ventilator    ASSESSMENT:   with medical history significant for decompensated cirrhosis with ascites and varices, CVA, type 2 diabetes, CAD, depression/anxiety, CKD stage III, who presents from SNF with altered mental status.  Pt admitted with hepatic encephalopathy.   6/3- s/p lt lower paracentesis (3 L removed)   Patient is currently intubated on ventilator support. No feeding access.  MV: 8.7 L/min Temp (24hrs), Avg:98.7 F (37.1 C), Min:98 F (36.7 C), Max:99.3 F (37.4 C)  Reviewed I/O's: -686 ml x 24 hours and +5.3 L since admission  UOP: 2.2 L x 24 hours  Case discussed with MD, RN, and during ICU rounds. Pt currently on pressors and receiving blood transfusion. Pt with generalized edema. Plan to reach out to GI to determine plan for scpoe. Plan to continue current care until family gets here, as there are limited options for pt.   Reviewed wt hx; pt has experienced a 1.9% wt loss over the past 3 months, which is not significant for time frame.  Per palliative care notes, plan for one-way extubation once family arrives (hopefully by ).   Medications reviewed and include lactulose, thiamine, levophed, fentanyl, sandostatin, potassium chloride and protonix.   Lab Results  Component Value Date   HGBA1C 5.8 (H) 05/06/2022   PTA DM  medications are 1000 mg metformin BID.   Labs reviewed: CBGS: 117-119 (inpatient orders for glycemic control are 0-6 units insulin aspart every 4 hours).    NUTRITION - FOCUSED PHYSICAL EXAM:  Flowsheet Row Most Recent Value  Orbital Region Mild depletion  Upper Arm Region No depletion  Thoracic and Lumbar Region No depletion  Buccal Region No depletion  Temple Region Mild depletion  Clavicle Bone Region Unable to assess  Clavicle and Acromion Bone Region No depletion  Scapular Bone Region No depletion  Dorsal Hand No depletion  Patellar Region Mild depletion  Anterior Thigh Region Mild depletion  Posterior Calf Region Mild depletion  Edema (RD Assessment) Mild  Hair Reviewed  Eyes Reviewed  Mouth Reviewed  Skin Reviewed  Nails Reviewed       Diet Order:   Diet Order             Diet NPO time specified  Diet effective now                   EDUCATION NEEDS:   Not appropriate for education at this time  Skin:  Skin Assessment: Skin Integrity Issues: Skin Integrity Issues:: Other (Comment) Other: non-pressurew wound to rt upper arm  Last BM:  06/30/22 (type 7 via rectal tube)  Height:   Ht Readings from Last 1 Encounters:  06/26/22 5\' 4"  (1.626 m)    Weight:   Wt Readings from Last 1 Encounters:  06/30/22 103.7 kg  Ideal Body Weight:  59.1 kg  BMI:  Body mass index is 39.24 kg/m.  Estimated Nutritional Needs:   Kcal:  4098-1191  Protein:  > 118 grams  Fluid:  1.2-1.4 L    Levada Schilling, RD, LDN, CDCES Registered Dietitian II Certified Diabetes Care and Education Specialist Please refer to [facility] for RD and/or RD on-call/weekend/after hours pager

## 2022-06-30 NOTE — Progress Notes (Signed)
NAME:  ERDMAN ROHER, MRN:  [id number], DOB:  1955-06-11, LOS: 4 ADMISSION DATE:  06/26/2022, CONSULTATION DATE: 06/29/2022 REFERRING MD: Dr. Clide Dales, CHIEF COMPLAINT: Altered Mental Status    History of Present Illness:  67 y.o. male with past medical history significant for decompensated cirrhosis with ascites and varices, CVA, type 2 diabetes, CAD, depression/anxiety, CKD stage III, who is admitted with Acute Metabolic/Hepatic Encephalopathy, Acute Upper GI Bleed, Severe Sepsis due to presumed SBP, and AKI.  Hospital course complicated by Acute Hypoxic Respiratory Failure due to pulmonary edema & presumed aspiration requiring intubation and mechanical ventilation.  History of Present Illness:  This is a 67 yo male who presented to [facility] ER via EMS from an LTACH on 06/1 with altered mental status.  Per ER notes his last known well time was 7:00 am on 06/1 although he was somewhat drowsy.  He was later found altered at 1400 on 06/1, EMS notified.  EMS reported upon their arrival pt had a GCS of 13 with confusion, nonsensical speech, and unable to follow commands.  ED Course Upon arrival to the ER pt remained altered.  Significant ER results were: BUN 43/creatinine 1.47/calcium 7.8/albumin 2.0/AST 110/ammonia 92/total bilirubin 2.6/lactic acid 2.1/wbc 17.1/hgb 6.0/platelets 139/PT 21.1/INR 1.8/UA negative for UTI.  CXR concerning for atelectasis.  CT Head negative for acute intracranial abnormalities.  COVID-19/Influenza A&B/RSV negative.  Pt received 1.5L NS bolus, iv thiamine, ceftriaxone, flagyl, and 1 unit of pRBC's.  He was subsequently admitted to the [facility] unit per hospitalist team for additional workup and treatment.  See detailed hospital course below under significant events.  CT Head: No acute intracranial abnormalities.  Mild cerebral atrophy.  Air-fluid levels in the left mastoid air cells may indicate mastoiditis. No acute fractures are identified.  Pertinent  Medical History    Anemia      Anxiety     Arthritis     Cerebrovascular disease     Cervical disc disorder     Chronic kidney disease     Cirrhosis of liver (HCC)     Coronary artery disease     Depression     Dyspnea      with exertion   Esophageal varices (HCC)      hx of   Family history of colon cancer 10/15/2019   GERD (gastroesophageal reflux disease)     Headache     Hyperlipidemia     Hyperlipidemia     Hypocalcemia     MI (myocardial infarction) (HCC) 04/23/2007    inferior wall   Morbid obesity (HCC) 06/26/2012   Neuropathy      secondary to diabetes   Pancreatitis      hs of   S/P CABG x 3 07/04/2012    LIMA to LAD, SVG to D1, SVG to PDA, EVH via right thigh   Sarcoidosis of skin     Sleep apnea      no cpap   Stroke (HCC)      small stroke - 2023 - legs weak , righ tleg weak   Type II or unspecified type diabetes mellitus without mention of complication, not stated as uncontrolled     Unspecified essential hypertension    Significant Hospital Events: Including procedures, antibiotic start and stop dates in addition to other pertinent events   06/1: Pt admitted to the [facility] unit with hepatic encephalopathy due to decompensated cirrhosis, hyperbilirubinemia, acute blood loss anemia due to suspected GI bleed, sepsis secondary to SBP, and AKI 06/2: GI  consulted pt noted to have large melenic stools recommended IV PPI and octreotide gtt and transfuse blood products gently due to concern of variceal bleeding with plans for EGD when clinically feasible  06/3: Pt underwent paracentesis per IR that yielded 3 liters of yellow fluid 06/4: Pt transferred to the stepdown unit with worsening acute hypoxic respiratory failure secondary to pulmonary edema and suspected aspiration requiring Bipap.  PCCM team consulted to assist with management.  INTUBATED 06/5: Remains on vent, Levophed weaned off. Receiving 1 unit of pRBC's for Hgb of 6.4, no overt bleeding noted.  Micro Data:   06/1: Blood (in  aerobic bottle only)>> DIPHTHEROIDS (CORYNEBACTERIUM SPECIES)  06/1: COVID-19/Influenza A&B/RSV>>negative  06/2: MRSA PCR>> + 06/3: Peritoneal fluid culture>> no growth to date  Anti-infectives (From admission, onward)    Start     Dose/Rate Route Frequency Ordered Stop   06/27/22 1700  cefTRIAXone (ROCEPHIN) 2 g in sodium chloride 0.9 % 100 mL IVPB        2 g 200 mL/hr over 30 Minutes Intravenous Every 24 hours 06/26/22 2243 07/01/22 1659   06/27/22 1000  tenofovir (VIREAD) tablet 300 mg  Status:  Discontinued        300 mg Oral Daily 06/26/22 2225 06/28/22 1509   06/27/22 0600  metroNIDAZOLE (FLAGYL) IVPB 500 mg        500 mg 100 mL/hr over 60 Minutes Intravenous Every 12 hours 06/26/22 2243 07/01/22 0559   06/26/22 1715  cefTRIAXone (ROCEPHIN) 2 g in sodium chloride 0.9 % 100 mL IVPB        2 g 200 mL/hr over 30 Minutes Intravenous  Once 06/26/22 1713 06/26/22 1824   06/26/22 1715  metroNIDAZOLE (FLAGYL) IVPB 500 mg        500 mg 100 mL/hr over 60 Minutes Intravenous  Once 06/26/22 1713 06/26/22 1954      Interim History / Subjective:  -No significant events noted overnight -Afebrile, hemodynamically stable, vasopressors weaned off -On minimal vent support (40% FiO2) ~ discussed with Dr. Belia Heman, will not perform SBT as pt was just intubated yesterday afternoon -Hgb down to 6.4 this am from 7.1, no overt bleeding noted ~ receiving 1 unit of pRBC's ~ GI following -Creatinine stable at 1.2 from 1.3, UOP 2.1L last 24 hrs (net+ 13L) -Leukocytosis resolved to 5 from 7.6  Objective   Blood pressure (!) 95/51, pulse (!) 103, temperature 98.3 F (36.8 C), temperature source Axillary, resp. rate 18, height 5\' 4"  (1.626 m), weight 103.7 kg, SpO2 98 %.    Vent Mode: PRVC FiO2 (%):  [40 %-60 %] 40 % Set Rate:  [16 bmp] 16 bmp Vt Set:  [450 mL-500 mL] 500 mL PEEP:  [5 cmH20] 5 cmH20 Plateau Pressure:  [17 cmH20-19 cmH20] 19 cmH20   Intake/Output Summary (Last 24 hours) at 06/30/2022  0735 Last data filed at 06/30/2022 0731 Gross per 24 hour  Intake 9973.78 ml  Output 2150 ml  Net 7823.78 ml    Filed Weights   06/26/22 1717 06/28/22 0141 06/30/22 0500  Weight: 107.7 kg 104.7 kg 103.7 kg   Examination: General: Acute on chronically-ill appearing male, laying in bed, intubated and sedated, in NAD HENT: Supple, no JVD, orally intubated Lungs: Coarse throughout, even, nonlabored, occasionally overbreathes the vent Cardiovascular: Sinus tachycardia, s1s2, no r/g, 2+ radial/2+ distal pulses, 2+ generalized edema  Abdomen: +BS x4, obese, soft, non distended, non tender  Extremities: Normal bulk and tone, no deformities, no edema, no cyanosis Neuro: Sedated, withdraws  from pain, not following commands, left periorbital ecchymosis, left pupil round/reactive  GU: foley catheter in place draining clear/yellow urine   Resolved Hospital Problem list     Assessment & Plan:   #Acute hypoxic respiratory failure secondary to pulmonary edema & suspected aspiration  Hx: OSA  -Full vent support, implement lung protective strategies -Plateau pressures less than 30 cm H20 -Wean FiO2 & PEEP as tolerated to maintain O2 sats >92% -Follow intermittent Chest X-ray & ABG as needed -Spontaneous Breathing Trials when respiratory parameters met and mental status permits -Implement VAP Bundle -Prn Bronchodilators -ABX as above -Diuresis as BP and renal function permits  #Acute Decompensated HFpEF  #Elevated troponin secondary to demand ischemia  Hx: CAD, CVA, HLD, inferior MI, HTN, CABG x3 Echo 04/09/22: EF >75%; indeterminate diastolic filling due to E-A fusion, mild tricuspid valve regurgitation  Echo 06/29/22: LVEF 60-65%, normal diastolic parameters, mildly elevated pulmonary artery pressure, normal RV systolic function, mild to moderate TR BNP 999 on 6/4 -Continuous cardiac monitoring -Maintain MAP >65 -Vasopressors as needed to maintain MAP goal ~ weaned off -Trend lactic acid  until normalized -HS Troponin peaked at 138 -Hold outpatient aspirin and beta-blocker therapy  -Diuresis as BP and renal function permits  #Acute kidney injury with anion gap metabolic acidosis secondary to ATN, ? Hepatorenal Syndrome ~ SLOWLY IMPROVING #Hypokalemia -Monitor I&O's / urinary output -Follow BMP -Ensure adequate renal perfusion -Avoid nephrotoxic agents as able -Replace electrolytes as indicated, Pharmacy following for assistance with electrolyte replacement -Continue Bicarb gtt  #Decompensated cirrhosis  #Acute blood loss anemia secondary to GI bleed  #Thrombocytopenia  #Ascites  #Hyperbilirubinemia  Hx: Grade II esophageal varices and port hypertensive gastropathy  RUQ [place] 06/29/22: Cirrhosis, no cholelithiasis or biliary dilatation -Trend CBC -Monitor for s/sx of bleeding  -Transfuse for hgb <7 and platelets count of <20 and/or signs of bleeding  -GI following, appreciate input: recommending EGD once encephalopathy improves, but will perform urgently if pt develops overt hemorrhage and keep blood/iv fluid infusion rates low to avoid increasing portal pressures that would lead to variceal rupture  -Continue PPI and octreotide gtt for now   #Sepsis due to suspected SBP #Gram negative rods in blood cultures, likely contaminant ? -Monitor fever curve -Trend WBC's & Procalcitonin -Follow cultures as above -Continue empiric Ceftriaxone pending cultures & sensitivities -GI following, appreciate input   #Type II diabetes mellitus  -CBG's q4h; Target range of 140 to 180 -SSI -Follow ICU Hypo/Hyperglycemia protocol  #Metabolic/Hepatic encephalopathy  #Sedation needs in the setting of mechanical ventilation -Treatment of hyperammonemia, sepsis, and metabolic derangements as outlined above -Maintain a RASS goal of 0 to -1 -Fentanyl as needed to maintain RASS goal -Avoid sedating medications as able -Daily wake up assessment -Continue lactulose enema's  q6h, titrate to  goal 3-4 loose BM's daily -Continue high dose thiamine for 4 days followed by 100 mg iv thiamine daily      Pt is critically ill with multiorgan failure, prognosis is guarded.  High risk for further decompensation, cardiac arrest and death.  Given severe Cirrhosis and multiple chronic co morbidities, overall long term prognosis is poor.  Pt is DNR.  Family is considering 1 way extubation as some point.  Palliative Care is following for assistance with GOC conversations.  Best Practice (right click and "Reselect all SmartList Selections" daily)   Diet/type: NPO DVT prophylaxis: SCD (chemical ppx contraindicated due to GI bleed) GI prophylaxis: PPI Lines: RUQ PICC Line, and is still indicated Foley:  yes, and is  still indicated Code Status:  DNR Last date of multidisciplinary goals of care discussion [06/30/2022]  6/5: Will update pt's family when they arrive at bedside.  Labs   CBC: Recent Labs  Lab 06/26/22 1819 06/27/22 0550 06/27/22 1820 06/28/22 0337 06/28/22 1620 06/29/22 0414 06/29/22 1626 06/30/22 0409  WBC 17.2*   < > 10.8* 10.5  --  6.4 7.6 5.0  NEUTROABS 14.3*  --   --   --   --   --  6.2 3.6  HGB 6.0*   < > 7.9* 7.4* 6.3* 7.2* 7.1* 6.4*  HCT 19.1*   < > 24.2* 22.7* 19.5* 22.6* 22.6* 20.6*  MCV 101.1*   < > 96.8 96.2  --  100.4* 99.6 100.0  PLT 139*   < > 78* 62*  --  44* 41* 34*   < > = values in this interval not displayed.     Basic Metabolic Panel: Recent Labs  Lab 06/26/22 1721 06/27/22 0550 06/28/22 0337 06/29/22 0414 06/29/22 1626 06/30/22 0409  NA 135 139 145 144  --  145  K 5.1 4.4 3.4* 3.6  --  2.9*  CL 100 104 109 105  --  109  CO2 22 22 21* 18*  --  26  GLUCOSE 131* 110* 91 188*  --  140*  BUN 43* 44* 40* 37*  --  29*  CREATININE 1.47* 1.40* 1.35* 1.39*  --  1.27*  CALCIUM 7.8* 7.4* 7.4* 8.5*  --  7.6*  MG  --   --   --   --  1.6* 2.2  PHOS  --   --   --   --  3.9 2.5    GFR: Estimated Creatinine Clearance: 63.2 mL/min (A) (by C-G  formula based on SCr of 1.27 mg/dL (H)). Recent Labs  Lab 06/26/22 1723 06/26/22 1819 06/26/22 2116 06/27/22 0550 06/28/22 0337 06/29/22 0414 06/29/22 1115 06/29/22 1626 06/30/22 0409  PROCALCITON  --   --   --   --   --  0.20  --   --   --   WBC  --    < >  --    < > 10.5 6.4  --  7.6 5.0  LATICACIDVEN 2.1*  --  1.9  --   --   --  0.9  --   --    < > = values in this interval not displayed.     Liver Function Tests: Recent Labs  Lab 06/26/22 1721 06/27/22 0550 06/28/22 0337 06/29/22 0414 06/30/22 0409  AST 110* 88* 97* 125*  --   ALT 42 35 38 49*  --   ALKPHOS 120 92 91 92  --   BILITOT 2.6* 2.7* 2.8* 2.7*  --   PROT 6.4* 5.9* 5.4* 6.3*  --   ALBUMIN 2.0* 2.5* 2.3* 3.3* 2.7*    No results for input(s): "LIPASE", "AMYLASE" in the last 168 hours. Recent Labs  Lab 06/26/22 1819 06/28/22 0337 06/29/22 0414  AMMONIA 92* 21 48*     ABG    Component Value Date/Time   PHART 7.25 (L) 06/29/2022 1428   PCO2ART 55 (H) 06/29/2022 1428   PO2ART 99 06/29/2022 1428   HCO3 24.1 06/29/2022 1428   TCO2 25 07/04/2012 2031   ACIDBASEDEF 3.9 (H) 06/29/2022 1428   O2SAT 98.1 06/29/2022 1428     Coagulation Profile: Recent Labs  Lab 06/26/22 1721 06/27/22 1513  INR 1.8* 1.7*     Cardiac Enzymes: No results for  input(s): "CKTOTAL", "CKMB", "CKMBINDEX", "TROPONINI" in the last 168 hours.  HbA1C: Hgb A1c MFr Bld  Date/Time Value Ref Range Status  05/06/2022 10:51 AM 5.8 (H) 4.8 - 5.6 % Final    Comment:    (NOTE) Pre diabetes:          5.7%-6.4%  Diabetes:              >6.4%  Glycemic control for   <7.0% adults with diabetes   03/01/2022 02:00 PM 6.7 (H) 4.6 - 6.5 % Final    Comment:    Glycemic Control Guidelines for People with Diabetes:Non Diabetic:  <6%Goal of Therapy: <7%Additional Action Suggested:  >8%     CBG: Recent Labs  Lab 06/29/22 1219 06/29/22 1602 06/29/22 1949 06/29/22 2353 06/30/22 0406  GLUCAP 126* 147* 121* 126* 127*      Review of Systems:   Unable to assess due to intubation/sedation/critical illness  Past Medical History:  He,  has a past medical history of Anemia, Anxiety, Arthritis, Cerebrovascular disease, Cervical disc disorder, Chronic kidney disease, Cirrhosis of liver (HCC), Coronary artery disease, Depression, Dyspnea, Esophageal varices (HCC), Family history of colon cancer (10/15/2019), GERD (gastroesophageal reflux disease), Headache, Hyperlipidemia, Hyperlipidemia, Hypocalcemia, MI (myocardial infarction) (HCC) (04/23/2007), Morbid obesity (HCC) (06/26/2012), Neuropathy, Pancreatitis, S/P CABG x 3 (07/04/2012), Sarcoidosis of skin, Sleep apnea, Stroke (HCC), Type II or unspecified type diabetes mellitus without mention of complication, not stated as uncontrolled, and Unspecified essential hypertension.   Surgical History:   Past Surgical History:  Procedure Laterality Date   CORONARY ANGIOPLASTY WITH STENT PLACEMENT  04/23/2007   PCI and stenting of mid RCA - Dr Bary Castilla @ [facility]   CORONARY ARTERY BYPASS GRAFT N/A 07/04/2012   Procedure: CORONARY ARTERY BYPASS GRAFTING (CABG);  Surgeon: [name], MD;  Location: [facility] OR;  Service: Open Heart Surgery;  Laterality: N/A;  x3 using right greater saphenous vein and left internal mammary.    ESOPHAGOGASTRODUODENOSCOPY (EGD) WITH PROPOFOL N/A 06/01/2022   Procedure: ESOPHAGOGASTRODUODENOSCOPY (EGD) WITH PROPOFOL;  Surgeon: Meryl Dare, MD;  Location: [facility] ENDOSCOPY;  Service: Gastroenterology;  Laterality: N/A;   INTRAOPERATIVE TRANSESOPHAGEAL ECHOCARDIOGRAM N/A 07/04/2012   Procedure: INTRAOPERATIVE TRANSESOPHAGEAL ECHOCARDIOGRAM;  Surgeon: [name], MD;  Location: [facility] OR;  Service: Open Heart Surgery;  Laterality: N/A;   LEFT HEART CATH AND CORS/GRAFTS ANGIOGRAPHY N/A 04/18/2017   Procedure: LEFT HEART CATH AND CORS/GRAFTS ANGIOGRAPHY;  Surgeon: [name], MD;  Location: [facility] INVASIVE CV LAB;  Service: Cardiovascular;  Laterality: N/A;    LEFT HEART CATHETERIZATION WITH CORONARY ANGIOGRAM N/A 06/25/2012   Procedure: LEFT HEART CATHETERIZATION WITH CORONARY ANGIOGRAM;  Surgeon: [name], MD;  Location: [facility] CATH LAB;  Service: Cardiovascular;  Laterality: N/A;   TOOTH EXTRACTION  04/2018   4 teeth pulled      Social History:   reports that he has never smoked. He has never used smokeless tobacco. He reports that he does not drink alcohol and does not use drugs.   Family History:  His family history includes Cancer in his father, maternal aunt, mother, and sister; Colon cancer in his brother and brother; Diabetes in his brother and sister.   Allergies No Known Allergies   Home Medications  Prior to Admission medications   Medication Sig Start Date End Date Taking? Authorizing Provider  acetaminophen (TYLENOL) 500 MG tablet Take 1 tablet (500 mg total) by mouth every 8 (eight) hours as needed for moderate pain. 04/24/22  Yes [name], MD  albuterol (PROVENTIL HFA;VENTOLIN HFA) 108 (90 Base) MCG/ACT inhaler Inhale 2 puffs into the lungs every 6 (six) hours as needed for wheezing or shortness of breath. 04/21/17  Yes Kirt Boys, DO  aspirin 81 MG chewable tablet Chew 81 mg by mouth daily.   Yes [provider]  atorvastatin (LIPITOR) 80 MG tablet Take 1 tablet (80 mg total) by mouth daily. Patient taking differently: Take 80 mg by mouth every evening. 05/08/22  Yes [name], MD  busPIRone (BUSPAR) 15 MG tablet TAKE 1 TABLET BY MOUTH THREE TIMES DAILY FOR ANXIETY Patient taking differently: Take 15 mg by mouth 3 (three) times daily. 07/19/17  Yes Montez Morita, Monica, DO  carvedilol (COREG) 6.25 MG tablet Take 1 tablet (6.25 mg total) by mouth daily at 12 noon. 06/06/22  Yes Gherghe, Daylene Katayama, MD  cyclobenzaprine (FLEXERIL) 10 MG tablet Take 10 mg by mouth at bedtime. May take an additional 10 mg up to twice daily as needed for pain related to RIGHT HIP, DO NOT GIVE WITHIN 6 HOURS OF SCHEDULED DOSE  07/23/19  Yes [provider]  DULoxetine (CYMBALTA) 20 MG capsule Take 20 mg by mouth daily.   Yes [provider]  FEROSUL 325 (65 Fe) MG tablet Take 325 mg by mouth daily. 10/13/21  Yes [provider]  fluticasone-salmeterol (ADVAIR) 100-50 MCG/ACT AEPB Inhale 1 puff into the lungs 2 (two) times daily.   Yes [provider]  furosemide (LASIX) 40 MG tablet Take 40 mg by mouth daily.   Yes [provider]  gabapentin (NEURONTIN) 300 MG capsule Take 300 mg by mouth in the morning and at bedtime. 09/12/19  Yes [provider]  ipratropium-albuterol (DUONEB) 0.5-2.5 (3) MG/3ML SOLN Take 3 mLs by nebulization every 6 (six) hours as needed.   Yes [provider]  lactulose (CHRONULAC) 10 GM/15ML solution Take 15 mLs (10 g total) by mouth 3 (three) times daily. 04/24/22  Yes [name], MD  metFORMIN (GLUCOPHAGE) 1000 MG tablet TAKE 1 TABLET(1000 MG) BY MOUTH TWICE DAILY Patient taking differently: Take 1,000 mg by mouth 2 (two) times daily with a meal. 09/09/17  Yes Kirt Boys, DO  oxyCODONE-acetaminophen (PERCOCET) 10-325 MG tablet Take 1 tablet by mouth every 8 (eight) hours as needed for pain. 06/05/22  Yes Gherghe, Daylene Katayama, MD  pantoprazole (PROTONIX) 40 MG tablet Take 1 tablet (40 mg total) by mouth 2 (two) times daily before a meal. TAKE 1 TABLET BY MOUTH EVERY DAY 06/05/22  Yes Gherghe, Daylene Katayama, MD  potassium chloride (KLOR-CON) 10 MEQ tablet Take 10 mEq by mouth 2 (two) times daily.   Yes [provider]  sertraline (ZOLOFT) 100 MG tablet TAKE 2 TABLETS(200 MG) BY MOUTH DAILY Patient taking differently: Take 200 mg by mouth daily. 04/20/19  Yes Sharon Seller, NP  tenofovir (VIREAD) 300 MG tablet Take 1 tablet (300 mg total) by mouth daily. 06/06/22  Yes Leatha Gilding, MD  thiamine (VITAMIN B-1) 100 MG tablet Take 1 tablet (100 mg total) by mouth daily. 03/06/22  Yes [name], MD  Blood Glucose  Monitoring Suppl (CONTOUR NEXT EZ MONITOR) w/Device KIT Test blood sugar three times daily E11.22 03/08/16   Edison Pace, RPH-CPP  cetirizine (ZYRTEC) 10 MG tablet Take 10 mg by mouth daily.    [provider]     Critical care time: 40 minutes     Harlon Ditty, AGACNP-BC [facility] Prefer epic messenger for cross cover  needs If after hours, please call E-link

## 2022-06-30 NOTE — Progress Notes (Signed)
Palliative Care Progress Note, Assessment & Plan   Patient Name: Barry [name]       Date: 06/30/2022 DOB: [birth year]  Age: 67 y.o. MRN#: [id number] Attending Physician: Erin Fulling, MD Primary Care Physician: [name], MD Admit Date: 06/26/2022  Subjective: Patient is lying in bed intubated and sedated.  1 unit RBC is running.  No family or friends present during my visit.  HPI: 67 y.o. male  with past medical history of decompensated cirrhosis with ascites and varices, CVA, type 2 diabetes, depression/anxiety, CKD stage III, and CAD admitted on 06/26/2022 from SNF with AMS.   Patient is being treated for hepatic encephalopathy, hyperbilirubinemia, acute blood loss anemia due to suspected GI bleed, sepsis secondary to SBP, and AKI.   Patient experienced acute hypoxic respiratory failure and was intubated on 6/4.   PMT was consulted to discuss goals of care.  Summary of counseling/coordination of care: After reviewing the patient's chart and assessing the patient at bedside, I counselled with CCM Dr. Belia Heman in regards to plan of care.   After discussing Dr. Belia Heman, I spoke with patient's daughter Barry [name] over the phone.  She is currently driving through [place] in an attempt to get to and see ASAP.  Medical update given.  I reviewed that patient is receiving 1 unit of RBCs in addition to octreotide and Protonix gtt.'s.  No signs of overt GI bleed at this time.  However, I outlined that patient's prognosis remains extremely guarded.  Therapeutic silence, active listening, and emotional support provided.  Barry [name] mentions that she is speaking with family in regards to funeral arrangements.  She mentions that when she gets to the hospital, visits with her father, and if she decides to remove the  ventilator, she would like for other family members to be present if they so choose.  I explored her comment of "if".    Goals clarified.  I outlined that patient is being medically supported and optimized with ventilatory and artificial support.  I highlighted that from previous discussions with CCM and patient's niece that patient would never want long term ventilatory support. I conveyed that the use of ventilatory support was not intended to prolong his life indefinitely but to allow Barry [name] and her family time to visit.    Barry [name] confirmed the plan is for her to come bedside to visit and to proceed with one-way extubation when family has had time to say goodbyes.    Plan is for Barry [name] to arrive - she is hopeful no later than Saturday- say her goodbyes, and proceed with one-way extubation.  PMT will continue to follow and support.   Physical Exam Vitals reviewed.  Constitutional:      General: He is not in acute distress.    Appearance: He is ill-appearing.  HENT:     Head: Normocephalic.     Mouth/Throat:     Mouth: Mucous membranes are moist.  Cardiovascular:     Pulses: Normal pulses.  Pulmonary:     Comments: MV Abdominal:     Palpations: Abdomen is soft.  Skin:    General: Skin is warm and dry.     Coloration: Skin is pale.  35 minutes  Marvelous Bouwens L. Manon Hilding, FNP-BC Palliative Medicine Team Team Phone # [phone]

## 2022-06-30 NOTE — Progress Notes (Signed)
PHARMACY CONSULT NOTE   Pharmacy Consult for Electrolyte Monitoring and Replacement   Recent Labs: Potassium (mmol/L)  Date Value  06/30/2022 2.9 (L)   Magnesium (mg/dL)  Date Value  16/10/9602 2.2   Calcium (mg/dL)  Date Value  54/09/8117 7.6 (L)   Albumin (g/dL)  Date Value  14/78/2956 2.7 (L)  05/30/2015 4.3   Phosphorus (mg/dL)  Date Value  21/30/8657 2.5   Sodium (mmol/L)  Date Value  06/30/2022 145  04/14/2017 139    Assessment: 67 year old male admitted with hepatic encephalopathy and possible GI bleed.     Goal of Therapy:  Electrolytes within normal limits  Plan:  K+ 2.9. Kcl IV x 6 runs. Follow up K+ @1400 , then tomorrow AM   Jaynie Bream ,PharmD Clinical Pharmacist 06/30/2022 8:53 AM

## 2022-07-01 ENCOUNTER — Inpatient Hospital Stay: Payer: Medicare HMO

## 2022-07-01 DIAGNOSIS — K7682 Hepatic encephalopathy: Secondary | ICD-10-CM | POA: Diagnosis not present

## 2022-07-01 LAB — GLUCOSE, CAPILLARY
Glucose-Capillary: 107 mg/dL — ABNORMAL HIGH (ref 70–99)
Glucose-Capillary: 106 mg/dL — ABNORMAL HIGH (ref 70–99)
Glucose-Capillary: 125 mg/dL — ABNORMAL HIGH (ref 70–99)
Glucose-Capillary: 135 mg/dL — ABNORMAL HIGH (ref 70–99)
Glucose-Capillary: 150 mg/dL — ABNORMAL HIGH (ref 70–99)

## 2022-07-01 LAB — CBC
HCT: 24.7 % — ABNORMAL LOW (ref 39.0–52.0)
Hemoglobin: 7.9 g/dL — ABNORMAL LOW (ref 13.0–17.0)
MCH: 31.3 pg (ref 26.0–34.0)
MCHC: 32 g/dL (ref 30.0–36.0)
MCV: 98 fL (ref 80.0–100.0)
Platelets: 34 10*3/uL — ABNORMAL LOW (ref 150–400)
RBC: 2.52 MIL/uL — ABNORMAL LOW (ref 4.22–5.81)
RDW: 21.1 % — ABNORMAL HIGH (ref 11.5–15.5)
WBC: 7.2 10*3/uL (ref 4.0–10.5)
nRBC: 0 % (ref 0.0–0.2)

## 2022-07-01 LAB — PROTIME-INR
INR: 2.4 — ABNORMAL HIGH (ref 0.8–1.2)
Prothrombin Time: 26.6 seconds — ABNORMAL HIGH (ref 11.4–15.2)

## 2022-07-01 LAB — MAGNESIUM: Magnesium: 2.1 mg/dL (ref 1.7–2.4)

## 2022-07-01 LAB — TYPE AND SCREEN
ABO/RH(D): A POS
Antibody Screen: NEGATIVE

## 2022-07-01 LAB — HEMOGLOBIN AND HEMATOCRIT, BLOOD
HCT: 24 % — ABNORMAL LOW (ref 39.0–52.0)
HCT: 25 % — ABNORMAL LOW (ref 39.0–52.0)
HCT: 24 % — ABNORMAL LOW (ref 39.0–52.0)
Hemoglobin: 7.6 g/dL — ABNORMAL LOW (ref 13.0–17.0)
Hemoglobin: 8 g/dL — ABNORMAL LOW (ref 13.0–17.0)
Hemoglobin: 7.7 g/dL — ABNORMAL LOW (ref 13.0–17.0)

## 2022-07-01 LAB — BASIC METABOLIC PANEL
Anion gap: 12 (ref 5–15)
BUN: 26 mg/dL — ABNORMAL HIGH (ref 8–23)
CO2: 25 mmol/L (ref 22–32)
Calcium: 7.6 mg/dL — ABNORMAL LOW (ref 8.9–10.3)
Chloride: 113 mmol/L — ABNORMAL HIGH (ref 98–111)
Creatinine, Ser: 1.1 mg/dL (ref 0.61–1.24)
GFR, Estimated: >60 mL/min (ref >60)
Glucose, Bld: 116 mg/dL — ABNORMAL HIGH (ref 70–99)
Potassium: 3.5 mmol/L (ref 3.5–5.1)
Sodium: 150 mmol/L — ABNORMAL HIGH (ref 135–145)

## 2022-07-01 LAB — AEROBIC/ANAEROBIC CULTURE W GRAM STAIN (SURGICAL/DEEP WOUND)

## 2022-07-01 LAB — BPAM RBC
ISSUE DATE / TIME: 202406050852
Unit Type and Rh: 600

## 2022-07-01 LAB — CULTURE, BLOOD (ROUTINE X 2)

## 2022-07-01 LAB — PHOSPHORUS: Phosphorus: 2.3 mg/dL — ABNORMAL LOW (ref 2.5–4.6)

## 2022-07-01 MED ORDER — DEXTROSE 5 % IV SOLN
INTRAVENOUS | Status: DC
  Administered 2022-07-01: 75 mL/h via INTRAVENOUS

## 2022-07-01 MED ORDER — VITAMIN K1 10 MG/ML IJ SOLN
10.0000 mg | Freq: Once | INTRAVENOUS | Status: AC
  Administered 2022-07-01: 10 mg via INTRAVENOUS
  Filled 2022-07-01 (×3): qty 1

## 2022-07-01 MED ORDER — VITAMIN K1 10 MG/ML IJ SOLN
10.0000 mg | Freq: Once | INTRAVENOUS | Status: DC
  Filled 2022-07-01: qty 1

## 2022-07-01 NOTE — Progress Notes (Signed)
NAME:  Barry [name], MRN:  [id number], DOB:  05-Apr-1955, LOS: 5 ADMISSION DATE:  06/26/2022, CONSULTATION DATE: 06/29/2022 REFERRING MD: Dr. Clide Dales, CHIEF COMPLAINT: Altered Mental Status    History of Present Illness:  67 y.o. male with past medical history significant for decompensated cirrhosis with ascites and varices, CVA, type 2 diabetes, CAD, depression/anxiety, CKD stage III, who is admitted with Acute Metabolic/Hepatic Encephalopathy, Acute Upper GI Bleed, Severe Sepsis due to presumed SBP, and AKI.  Hospital course complicated by Acute Hypoxic Respiratory Failure due to pulmonary edema & presumed aspiration requiring intubation and mechanical ventilation.  History of Present Illness:  This is a 67 yo male who presented to [facility] ER via EMS from an LTACH on 06/1 with altered mental status.  Per ER notes his last known well time was 7:00 am on 06/1 although he was somewhat drowsy.  He was later found altered at 1400 on 06/1, EMS notified.  EMS reported upon their arrival pt had a GCS of 13 with confusion, nonsensical speech, and unable to follow commands.  ED Course Upon arrival to the ER pt remained altered.  Significant ER results were: BUN 43/creatinine 1.47/calcium 7.8/albumin 2.0/AST 110/ammonia 92/total bilirubin 2.6/lactic acid 2.1/wbc 17.1/hgb 6.0/platelets 139/PT 21.1/INR 1.8/UA negative for UTI.  CXR concerning for atelectasis.  CT Head negative for acute intracranial abnormalities.  COVID-19/Influenza A&B/RSV negative.  Pt received 1.5L NS bolus, iv thiamine, ceftriaxone, flagyl, and 1 unit of pRBC's.  He was subsequently admitted to the [facility] unit per hospitalist team for additional workup and treatment.  See detailed hospital course below under significant events.  CT Head: No acute intracranial abnormalities.  Mild cerebral atrophy.  Air-fluid levels in the left mastoid air cells may indicate mastoiditis. No acute fractures are identified.  Pertinent  Medical History    Anemia      Anxiety     Arthritis     Cerebrovascular disease     Cervical disc disorder     Chronic kidney disease     Cirrhosis of liver (HCC)     Coronary artery disease     Depression     Dyspnea      with exertion   Esophageal varices (HCC)      hx of   Family history of colon cancer 10/15/2019   GERD (gastroesophageal reflux disease)     Headache     Hyperlipidemia     Hyperlipidemia     Hypocalcemia     MI (myocardial infarction) (HCC) 04/23/2007    inferior wall   Morbid obesity (HCC) 06/26/2012   Neuropathy      secondary to diabetes   Pancreatitis      hs of   S/P CABG x 3 07/04/2012    LIMA to LAD, SVG to D1, SVG to PDA, EVH via right thigh   Sarcoidosis of skin     Sleep apnea      no cpap   Stroke (HCC)      small stroke - 2023 - legs weak , righ tleg weak   Type II or unspecified type diabetes mellitus without mention of complication, not stated as uncontrolled     Unspecified essential hypertension    Significant Hospital Events: Including procedures, antibiotic start and stop dates in addition to other pertinent events   06/1: Pt admitted to the [facility] unit with hepatic encephalopathy due to decompensated cirrhosis, hyperbilirubinemia, acute blood loss anemia due to suspected GI bleed, sepsis secondary to SBP, and AKI 06/2: GI  consulted pt noted to have large melenic stools recommended IV PPI and octreotide gtt and transfuse blood products gently due to concern of variceal bleeding with plans for EGD when clinically feasible  06/3: Pt underwent paracentesis per IR that yielded 3 liters of yellow fluid 06/4: Pt transferred to the stepdown unit with worsening acute hypoxic respiratory failure secondary to pulmonary edema and suspected aspiration requiring Bipap.  PCCM team consulted to assist with management.  INTUBATED 06/5: Remains on vent, Levophed weaned off. Receiving 1 unit of pRBC's for Hgb of 6.4, no overt bleeding noted. 06/6: On minimal vent support,  attempted WUA & SBT but failed due to hypoxia. Hgb stable following 1 unit pRBC yesterday, no bleeding noted. INR increased to 2.4 from 1.7, will give Vitamin K.  Start D5W for hypernatremia.  Micro Data:   06/1: Blood (in aerobic bottle only)>> DIPHTHEROIDS (CORYNEBACTERIUM SPECIES)  06/1: COVID-19/Influenza A&B/RSV>>negative  06/2: MRSA PCR>> + 06/3: Peritoneal fluid culture>> no growth to date  Anti-infectives (From admission, onward)    Start     Dose/Rate Route Frequency Ordered Stop   06/27/22 1700  cefTRIAXone (ROCEPHIN) 2 g in sodium chloride 0.9 % 100 mL IVPB  Status:  Discontinued        2 g 200 mL/hr over 30 Minutes Intravenous Every 24 hours 06/26/22 2243 06/30/22 1051   06/27/22 1000  tenofovir (VIREAD) tablet 300 mg  Status:  Discontinued        300 mg Oral Daily 06/26/22 2225 06/28/22 1509   06/27/22 0600  metroNIDAZOLE (FLAGYL) IVPB 500 mg  Status:  Discontinued        500 mg 100 mL/hr over 60 Minutes Intravenous Every 12 hours 06/26/22 2243 06/30/22 1051   06/26/22 1715  cefTRIAXone (ROCEPHIN) 2 g in sodium chloride 0.9 % 100 mL IVPB        2 g 200 mL/hr over 30 Minutes Intravenous  Once 06/26/22 1713 06/26/22 1824   06/26/22 1715  metroNIDAZOLE (FLAGYL) IVPB 500 mg        500 mg 100 mL/hr over 60 Minutes Intravenous  Once 06/26/22 1713 06/26/22 1954      Interim History / Subjective:  -No significant events noted overnight -Afebrile, hemodynamically stable, vasopressors weaned off -On minimal vent support (35% FiO2) ~ attempted WUA & SBT but failed due to hypoxia -Hgb improved to 7.9 from 7.6, no bleeding noted -INR increased to 2.4 from 1.7, will give Vitamin K -Creatinine improved to 1.1 from 1.2, UOP 1.1L last 24 hrs (net+ 13.6L) -Na increased to 150, will start D5W @ 75 ml/hr   Objective   Blood pressure 106/60, pulse (!) 103, temperature 98.1 F (36.7 C), resp. rate 17, height 5\' 4"  (1.626 m), weight 103.7 kg, SpO2 99 %. CVP:  [1 mmHg-11 mmHg] 10 mmHg   Vent Mode: PRVC FiO2 (%):  [30 %-35 %] 35 % Set Rate:  [16 bmp] 16 bmp Vt Set:  [500 mL] 500 mL PEEP:  [5 cmH20] 5 cmH20 Plateau Pressure:  [24 cmH20] 24 cmH20   Intake/Output Summary (Last 24 hours) at 07/01/2022 0718 Last data filed at 07/01/2022 0600 Gross per 24 hour  Intake 10179.42 ml  Output 1850 ml  Net 8329.42 ml    Filed Weights   06/26/22 1717 06/28/22 0141 06/30/22 0500  Weight: 107.7 kg 104.7 kg 103.7 kg   Examination: General: Acute on chronically-ill appearing male, laying in bed, intubated and sedated, in NAD HENT: Supple, no JVD, orally intubated Lungs: Coarse throughout, even,  nonlabored, occasionally overbreathes the vent Cardiovascular: Sinus tachycardia, s1s2, no r/g, 2+ radial/2+ distal pulses, 2+ generalized edema  Abdomen: +BS x4, obese, soft, non distended, non tender  Extremities: Normal bulk and tone, no deformities, no edema, no cyanosis Neuro: Sedated, withdraws from pain, not following commands, left periorbital ecchymosis, left pupil round/reactive  GU: foley catheter in place draining clear/yellow urine   Resolved Hospital Problem list     Assessment & Plan:   #Acute hypoxic respiratory failure secondary to pulmonary edema & suspected aspiration  Hx: OSA  -Full vent support, implement lung protective strategies -Plateau pressures less than 30 cm H20 -Wean FiO2 & PEEP as tolerated to maintain O2 sats >92% -Follow intermittent Chest X-ray & ABG as needed -Spontaneous Breathing Trials when respiratory parameters met and mental status permits -Implement VAP Bundle -Prn Bronchodilators -ABX as above -Diuresis as BP and renal function permits ~ holding due to AKI  #Acute Decompensated HFpEF  #Elevated troponin secondary to demand ischemia  Hx: CAD, CVA, HLD, inferior MI, HTN, CABG x3 Echo 04/09/22: EF >75%; indeterminate diastolic filling due to E-A fusion, mild tricuspid valve regurgitation  Echo 06/29/22: LVEF 60-65%, normal diastolic  parameters, mildly elevated pulmonary artery pressure, normal RV systolic function, mild to moderate TR BNP 999 on 6/4 -Continuous cardiac monitoring -Maintain MAP >65 -Vasopressors as needed to maintain MAP goal ~ weaned off -Trend lactic acid until normalized -HS Troponin peaked at 138 -Hold outpatient aspirin and beta-blocker therapy  -Diuresis as BP and renal function permits ~ holding due to AKI  #Acute kidney injury with anion gap metabolic acidosis secondary to ATN, ? Hepatorenal Syndrome ~ SLOWLY IMPROVING #Hypokalemia #Hypernatremia -Monitor I&O's / urinary output -Follow BMP -Ensure adequate renal perfusion -Avoid nephrotoxic agents as able -Replace electrolytes as indicated, Pharmacy following for assistance with electrolyte replacement -Start D5W @ 75 ml/hr (unable to place NG/OG for free water flushes due to esophageal varices)  #Decompensated cirrhosis  #Acute blood loss anemia secondary to GI bleed  #Thrombocytopenia  #Ascites  #Hyperbilirubinemia  Hx: Grade II esophageal varices and port hypertensive gastropathy  RUQ [place] 06/29/22: Cirrhosis, no cholelithiasis or biliary dilatation -Trend CBC & coags -Monitor for s/sx of bleeding  -Transfuse for hgb <7 and platelets count of <20 and/or signs of bleeding  -GI following, appreciate input: recommending EGD once encephalopathy improves, but will perform urgently if pt develops overt hemorrhage and keep blood/iv fluid infusion rates low to avoid increasing portal pressures that would lead to variceal rupture  -Continue PPI and octreotide gtt for now  -Will give Vitamin K on 6/6  #Sepsis due to suspected SBP #Gram negative rods in blood cultures, likely contaminant ? -Monitor fever curve -Trend WBC's & Procalcitonin -Follow cultures as above -Completed course of Ceftriaxone -GI following, appreciate input   #Type II diabetes mellitus  -CBG's q4h; Target range of 140 to 180 -SSI -Follow ICU Hypo/Hyperglycemia  protocol  #Metabolic/Hepatic encephalopathy  #Sedation needs in the setting of mechanical ventilation -Treatment of hyperammonemia, sepsis, and metabolic derangements as outlined above -Maintain a RASS goal of 0 to -1 -Fentanyl as needed to maintain RASS goal -Avoid sedating medications as able -Daily wake up assessment -Continue lactulose enema's  q6h, titrate to goal 3-4 loose BM's daily -Continue high dose thiamine for 4 days followed by 100 mg iv thiamine daily      Pt is critically ill with multiorgan failure, prognosis is guarded.  High risk for further decompensation, cardiac arrest and death.  Given severe Cirrhosis and multiple chronic  co morbidities, overall long term prognosis is poor.  Pt is DNR.  Family is considering 1 way extubation as some point.  Palliative Care is following for assistance with GOC conversations.  Best Practice (right click and "Reselect all SmartList Selections" daily)   Diet/type: NPO DVT prophylaxis: SCD (chemical ppx contraindicated due to GI bleed) GI prophylaxis: PPI Lines: RUQ PICC Line, and is still indicated Foley:  yes, and is still indicated Code Status:  DNR Last date of multidisciplinary goals of care discussion [07/01/2022]  6/6: Will update pt's family via telephone later today.  Labs   CBC: Recent Labs  Lab 06/26/22 1819 06/27/22 0550 06/28/22 0337 06/28/22 1620 06/29/22 0414 06/29/22 1626 06/30/22 0409 06/30/22 1451 06/30/22 1936 07/01/22 0200 07/01/22 0428  WBC 17.2*   < > 10.5  --  6.4 7.6 5.0  --   --   --  7.2  NEUTROABS 14.3*  --   --   --   --  6.2 3.6  --   --   --   --   HGB 6.0*   < > 7.4*   < > 7.2* 7.1* 6.4* 7.3* 7.3* 7.6* 7.9*  HCT 19.1*   < > 22.7*   < > 22.6* 22.6* 20.6* 22.5* 22.5* 24.0* 24.7*  MCV 101.1*   < > 96.2  --  100.4* 99.6 100.0  --   --   --  98.0  PLT 139*   < > 62*  --  44* 41* 34*  --   --   --  34*   < > = values in this interval not displayed.     Basic Metabolic Panel: Recent Labs   Lab 06/27/22 0550 06/28/22 0337 06/29/22 0414 06/29/22 1626 06/30/22 0409 06/30/22 1451 07/01/22 0428  NA 139 145 144  --  145  --  150*  K 4.4 3.4* 3.6  --  2.9* 3.6 3.5  CL 104 109 105  --  109  --  113*  CO2 22 21* 18*  --  26  --  25  GLUCOSE 110* 91 188*  --  140*  --  116*  BUN 44* 40* 37*  --  29*  --  26*  CREATININE 1.40* 1.35* 1.39*  --  1.27*  --  1.10  CALCIUM 7.4* 7.4* 8.5*  --  7.6*  --  7.6*  MG  --   --   --  1.6* 2.2  --  2.1  PHOS  --   --   --  3.9 2.5  --  2.3*    GFR: Estimated Creatinine Clearance: 72.9 mL/min (by C-G formula based on SCr of 1.1 mg/dL). Recent Labs  Lab 06/26/22 1723 06/26/22 1819 06/26/22 2116 06/27/22 0550 06/29/22 0414 06/29/22 1115 06/29/22 1626 06/30/22 0409 07/01/22 0428  PROCALCITON  --   --   --   --  0.20  --   --   --   --   WBC  --    < >  --    < > 6.4  --  7.6 5.0 7.2  LATICACIDVEN 2.1*  --  1.9  --   --  0.9  --   --   --    < > = values in this interval not displayed.     Liver Function Tests: Recent Labs  Lab 06/26/22 1721 06/27/22 0550 06/28/22 0337 06/29/22 0414 06/30/22 0409  AST 110* 88* 97* 125*  --   ALT 42 35 38  49*  --   ALKPHOS 120 92 91 92  --   BILITOT 2.6* 2.7* 2.8* 2.7*  --   PROT 6.4* 5.9* 5.4* 6.3*  --   ALBUMIN 2.0* 2.5* 2.3* 3.3* 2.7*    No results for input(s): "LIPASE", "AMYLASE" in the last 168 hours. Recent Labs  Lab 06/26/22 1819 06/28/22 0337 06/29/22 0414  AMMONIA 92* 21 48*     ABG    Component Value Date/Time   PHART 7.25 (L) 06/29/2022 1428   PCO2ART 55 (H) 06/29/2022 1428   PO2ART 99 06/29/2022 1428   HCO3 24.1 06/29/2022 1428   TCO2 25 07/04/2012 2031   ACIDBASEDEF 3.9 (H) 06/29/2022 1428   O2SAT 98.1 06/29/2022 1428     Coagulation Profile: Recent Labs  Lab 06/26/22 1721 06/27/22 1513 07/01/22 0428  INR 1.8* 1.7* 2.4*     Cardiac Enzymes: No results for input(s): "CKTOTAL", "CKMB", "CKMBINDEX", "TROPONINI" in the last 168 hours.  HbA1C: Hgb  A1c MFr Bld  Date/Time Value Ref Range Status  05/06/2022 10:51 AM 5.8 (H) 4.8 - 5.6 % Final    Comment:    (NOTE) Pre diabetes:          5.7%-6.4%  Diabetes:              >6.4%  Glycemic control for   <7.0% adults with diabetes   03/01/2022 02:00 PM 6.7 (H) 4.6 - 6.5 % Final    Comment:    Glycemic Control Guidelines for People with Diabetes:Non Diabetic:  <6%Goal of Therapy: <7%Additional Action Suggested:  >8%     CBG: Recent Labs  Lab 06/30/22 1227 06/30/22 1649 06/30/22 1929 06/30/22 2337 07/01/22 0310  GLUCAP 117* 101* 108* 105* 107*     Review of Systems:   Unable to assess due to intubation/sedation/critical illness  Past Medical History:  He,  has a past medical history of Anemia, Anxiety, Arthritis, Cerebrovascular disease, Cervical disc disorder, Chronic kidney disease, Cirrhosis of liver (HCC), Coronary artery disease, Depression, Dyspnea, Esophageal varices (HCC), Family history of colon cancer (10/15/2019), GERD (gastroesophageal reflux disease), Headache, Hyperlipidemia, Hyperlipidemia, Hypocalcemia, MI (myocardial infarction) (HCC) (04/23/2007), Morbid obesity (HCC) (06/26/2012), Neuropathy, Pancreatitis, S/P CABG x 3 (07/04/2012), Sarcoidosis of skin, Sleep apnea, Stroke (HCC), Type II or unspecified type diabetes mellitus without mention of complication, not stated as uncontrolled, and Unspecified essential hypertension.   Surgical History:   Past Surgical History:  Procedure Laterality Date   CORONARY ANGIOPLASTY WITH STENT PLACEMENT  04/23/2007   PCI and stenting of mid RCA - Dr Bary Castilla @ [facility]   CORONARY ARTERY BYPASS GRAFT N/A 07/04/2012   Procedure: CORONARY ARTERY BYPASS GRAFTING (CABG);  Surgeon: [name], MD;  Location: [facility] OR;  Service: Open Heart Surgery;  Laterality: N/A;  x3 using right greater saphenous vein and left internal mammary.    ESOPHAGOGASTRODUODENOSCOPY (EGD) WITH PROPOFOL N/A 06/01/2022   Procedure: ESOPHAGOGASTRODUODENOSCOPY  (EGD) WITH PROPOFOL;  Surgeon: Meryl Dare, MD;  Location: [facility] ENDOSCOPY;  Service: Gastroenterology;  Laterality: N/A;   INTRAOPERATIVE TRANSESOPHAGEAL ECHOCARDIOGRAM N/A 07/04/2012   Procedure: INTRAOPERATIVE TRANSESOPHAGEAL ECHOCARDIOGRAM;  Surgeon: [name], MD;  Location: [facility] OR;  Service: Open Heart Surgery;  Laterality: N/A;   LEFT HEART CATH AND CORS/GRAFTS ANGIOGRAPHY N/A 04/18/2017   Procedure: LEFT HEART CATH AND CORS/GRAFTS ANGIOGRAPHY;  Surgeon: [name], MD;  Location: [facility] INVASIVE CV LAB;  Service: Cardiovascular;  Laterality: N/A;   LEFT HEART CATHETERIZATION WITH CORONARY ANGIOGRAM N/A 06/25/2012   Procedure: LEFT HEART CATHETERIZATION  WITH CORONARY ANGIOGRAM;  Surgeon: [name], MD;  Location: [facility] CATH LAB;  Service: Cardiovascular;  Laterality: N/A;   TOOTH EXTRACTION  04/2018   4 teeth pulled      Social History:   reports that he has never smoked. He has never used smokeless tobacco. He reports that he does not drink alcohol and does not use drugs.   Family History:  His family history includes Cancer in his father, maternal aunt, mother, and sister; Colon cancer in his brother and brother; Diabetes in his brother and sister.   Allergies No Known Allergies   Home Medications  Prior to Admission medications   Medication Sig Start Date End Date Taking? Authorizing Provider  acetaminophen (TYLENOL) 500 MG tablet Take 1 tablet (500 mg total) by mouth every 8 (eight) hours as needed for moderate pain. 04/24/22  Yes [name], MD  albuterol (PROVENTIL HFA;VENTOLIN HFA) 108 [phone] Base) MCG/ACT inhaler Inhale 2 puffs into the lungs every 6 (six) hours as needed for wheezing or shortness of breath. 04/21/17  Yes Kirt Boys, DO  aspirin 81 MG chewable tablet Chew 81 mg by mouth daily.   Yes [provider]  atorvastatin (LIPITOR) 80 MG tablet Take 1 tablet (80 mg total) by mouth daily. Patient taking differently: Take 80 mg by mouth every  evening. 05/08/22  Yes [name], MD  busPIRone (BUSPAR) 15 MG tablet TAKE 1 TABLET BY MOUTH THREE TIMES DAILY FOR ANXIETY Patient taking differently: Take 15 mg by mouth 3 (three) times daily. 07/19/17  Yes Montez Morita, Monica, DO  carvedilol (COREG) 6.25 MG tablet Take 1 tablet (6.25 mg total) by mouth daily at 12 noon. 06/06/22  Yes Gherghe, Daylene Katayama, MD  cyclobenzaprine (FLEXERIL) 10 MG tablet Take 10 mg by mouth at bedtime. May take an additional 10 mg up to twice daily as needed for pain related to RIGHT HIP, DO NOT GIVE WITHIN 6 HOURS OF SCHEDULED DOSE 07/23/19  Yes [provider]  DULoxetine (CYMBALTA) 20 MG capsule Take 20 mg by mouth daily.   Yes [provider]  FEROSUL 325 (65 Fe) MG tablet Take 325 mg by mouth daily. 10/13/21  Yes [provider]  fluticasone-salmeterol (ADVAIR) 100-50 MCG/ACT AEPB Inhale 1 puff into the lungs 2 (two) times daily.   Yes [provider]  furosemide (LASIX) 40 MG tablet Take 40 mg by mouth daily.   Yes [provider]  gabapentin (NEURONTIN) 300 MG capsule Take 300 mg by mouth in the [name] and at bedtime. 09/12/19  Yes [provider]  ipratropium-albuterol (DUONEB) 0.5-2.5 (3) MG/3ML SOLN Take 3 mLs by nebulization every 6 (six) hours as needed.   Yes [provider]  lactulose (CHRONULAC) 10 GM/15ML solution Take 15 mLs (10 g total) by mouth 3 (three) times daily. 04/24/22  Yes [name], MD  metFORMIN (GLUCOPHAGE) 1000 MG tablet TAKE 1 TABLET(1000 MG) BY MOUTH TWICE DAILY Patient taking differently: Take 1,000 mg by mouth 2 (two) times daily with a meal. 09/09/17  Yes Kirt Boys, DO  oxyCODONE-acetaminophen (PERCOCET) 10-325 MG tablet Take 1 tablet by mouth every 8 (eight) hours as needed for pain. 06/05/22  Yes Gherghe, Daylene Katayama, MD  pantoprazole (PROTONIX) 40 MG tablet Take 1 tablet (40 mg total) by mouth 2 (two) times daily before a meal. TAKE 1 TABLET BY MOUTH EVERY DAY 06/05/22   Yes Gherghe, Daylene Katayama, MD  potassium chloride (KLOR-CON) 10 MEQ tablet Take 10 mEq by mouth  2 (two) times daily.   Yes [provider]  sertraline (ZOLOFT) 100 MG tablet TAKE 2 TABLETS(200 MG) BY MOUTH DAILY Patient taking differently: Take 200 mg by mouth daily. 04/20/19  Yes Sharon Seller, NP  tenofovir (VIREAD) 300 MG tablet Take 1 tablet (300 mg total) by mouth daily. 06/06/22  Yes Leatha Gilding, MD  thiamine (VITAMIN B-1) 100 MG tablet Take 1 tablet (100 mg total) by mouth daily. 03/06/22  Yes [name], MD  Blood Glucose Monitoring Suppl (CONTOUR NEXT EZ MONITOR) w/Device KIT Test blood sugar three times daily E11.22 03/08/16   Edison Pace, RPH-CPP  cetirizine (ZYRTEC) 10 MG tablet Take 10 mg by mouth daily.    [provider]     Critical care time: 40 minutes     Harlon Ditty, AGACNP-BC [facility] Prefer epic messenger for cross cover needs If after hours, please call E-link

## 2022-07-01 NOTE — Progress Notes (Signed)
PHARMACY CONSULT NOTE   Pharmacy Consult for Electrolyte Monitoring and Replacement   Recent Labs: Potassium (mmol/L)  Date Value  07/01/2022 3.5   Magnesium (mg/dL)  Date Value  40/98/1191 2.1   Calcium (mg/dL)  Date Value  47/82/9562 7.6 (L)   Albumin (g/dL)  Date Value  13/08/6576 2.7 (L)  05/30/2015 4.3   Phosphorus (mg/dL)  Date Value  46/96/2952 2.3 (L)   Sodium (mmol/L)  Date Value  07/01/2022 150 (H)  04/14/2017 139    Assessment: 67 year old male admitted to CCU with hepatic encephalopathy and possible GI bleed. Past medical history includes cirrhosis with ascites and varices, CVA, type 2 diabetes, CAD, depression/anxiety, CKD stage III.   Diet: NPO Pressors: Levophed DVT PPX: SCDs Possible GI Bleed: IV pantoprazole, octreotide infusion Sedation: fentanyl infusion,  Wernicke's prophylaxis: thiamine 100 mg daily  Goal of Therapy:  Electrolytes within normal limits  Plan:  Phos slightly low. Continue to monitor. Consider replacing with IV Kphos if downtrends Follow up electrolytes tomorrow AM   Elliot Gurney, PharmD, BCPS Clinical Pharmacist  07/01/2022 8:36 AM

## 2022-07-01 NOTE — Progress Notes (Signed)
                                                     Palliative Care Progress Note, Assessment & Plan   Patient Name: Barry [name]       Date: 07/01/2022 DOB: 25-Jan-1956  Age: 67 y.o. MRN#: [id number] Attending Physician: Erin Fulling, MD Primary Care Physician: [name], MD Admit Date: 06/26/2022  Chart reviewed. Patient assessed. No acute changed since assessment yesterday.   No acute palliative needs at this time.   Plan remains for patient's daughter to arrive this weekend and prepare for one way extubation after family has time to visit with family.   PMT will continue to follow and remain available to patient and family throughout his hospitalization.   Samara Deist L. Manon Hilding, FNP-BC Palliative Medicine Team Team Phone # [phone]  No charge

## 2022-07-01 NOTE — Progress Notes (Addendum)
0800 Attempted to place patient on spontaneous mode on the ventilator (spontaneous breathing trial). Patient was unable to keep oxygen saturations above 82%.  Remains sedated and intubated . Hear rhythm is ST. 1612 Had brief run of SVT rate 160. Resumed ST at rate of low 100s a few seconds later. Continuing with every 6 hours lactulose enemas.

## 2022-07-01 NOTE — Progress Notes (Signed)
Updated pt's niece [name] and pt's sister at bedside regarding plan of care.  All questions answered.  They report that [name] should arrive tomorrow to see her dad, and eventually plan for 1 way extubation.    Harlon Ditty, AGACNP-BC [facility] Prefer epic messenger for cross cover needs If after hours, please call E-link

## 2022-07-01 NOTE — Progress Notes (Signed)
GI Inpatient Follow-up Note  Subjective:  Patient seen in follow-up for GI bleed and hepatic encephalopathy. No acute events overnight. No signs of overt GI bleeding per nursing report. He has rectal tube in place with dark green output. No family at bedside. Patient is intubated and sedated. He failed SBT due to hypoxia. Hemoglobin stable after 1 unit pRBC transfusion yesterday. INR increased to 2.4.   Scheduled Inpatient Medications:   sodium chloride   Intravenous Once   Chlorhexidine Gluconate Cloth  6 each Topical Q0600   insulin aspart  0-6 Units Subcutaneous Q4H   lactulose  300 mL Rectal Q6H   mupirocin ointment  1 Application Nasal BID   pantoprazole  40 mg Intravenous Q12H   sodium chloride flush  10-40 mL Intracatheter Q12H   sodium chloride flush  3 mL Intravenous Q12H   [START ON ] thiamine (VITAMIN B1) injection  100 mg Intravenous Daily    Continuous Inpatient Infusions:    sodium chloride Stopped (06/27/22 0033)   sodium chloride Stopped (06/30/22 0513)   dextrose 75 mL/hr (07/01/22 0803)   fentaNYL infusion INTRAVENOUS 150 mcg/hr (07/01/22 0600)   norepinephrine (LEVOPHED) Adult infusion Stopped (06/30/22 0158)   octreotide (SANDOSTATIN) 500 mcg in sodium chloride 0.9 % 250 mL (2 mcg/mL) infusion 50 mcg/hr (07/01/22 0848)   thiamine (VITAMIN B1) injection 500 mg (07/01/22 1100)    PRN Inpatient Medications:  fentaNYL, ipratropium-albuterol, midazolam, ondansetron **OR** ondansetron (ZOFRAN) IV, sodium chloride flush  Review of Systems: Constitutional: Weight is stable.  Eyes: No changes in vision. ENT: No oral lesions, sore throat.  GI: see HPI.  Heme/Lymph: No easy bruising.  CV: No chest pain.  GU: No hematuria.  Integumentary: No rashes.  Neuro: No headaches.  Psych: No depression/anxiety.  Endocrine: No heat/cold intolerance.  Allergic/Immunologic: No urticaria.  Resp: No cough, SOB.  Musculoskeletal: No joint swelling.    Physical  Examination: BP (!) 95/52   Pulse (!) 102   Temp 98.1 F (36.7 C)   Resp 18   Ht 5\' 4"  (1.626 m)   Wt 103.7 kg   SpO2 93%   BMI 39.24 kg/m  Gen: acute distress, ill-appearing, intubated and sedated  HEENT: normocephalic, bruise to left eye Neck: supple, no JVD or thyromegaly Chest: Course breath sounds bilaterally CV: tachycardic, no m/g/c/r Abd: soft, NT, ND, +BS in all four quadrants; no HSM, guarding, ridigity, or rebound tenderness Ext: no edema, skin warm and dry Skin: no rash or lesions noted Lymph: no LAD  Data: Lab Results  Component Value Date   WBC 7.2 07/01/2022   HGB 7.7 (L) 07/01/2022   HCT 24.0 (L) 07/01/2022   MCV 98.0 07/01/2022   PLT 34 (L) 07/01/2022   Recent Labs  Lab 07/01/22 0428 07/01/22 0912 07/01/22 1427  HGB 7.9* 8.0* 7.7*   Lab Results  Component Value Date   NA 150 (H) 07/01/2022   K 3.5 07/01/2022   CL 113 (H) 07/01/2022   CO2 25 07/01/2022   BUN 26 (H) 07/01/2022   CREATININE 1.10 07/01/2022   Lab Results  Component Value Date   ALT 49 (H) 06/29/2022   AST 125 (H) 06/29/2022   ALKPHOS 92 06/29/2022   BILITOT 2.7 (H) 06/29/2022   Recent Labs  Lab 06/26/22 1721 06/27/22 1513 07/01/22 0428  APTT 22*  --   --   INR 1.8*   < > 2.4*   < > = values in this interval not displayed.    Assessment/Plan:  67 y/o Caucasian male with a PMH of decompensated hepatic cirrhosis c/b abdominal ascites and EV, morbid obesity (BMI 40), HTN, HLD, OA, hx of CVA, T2DM, CAD s/p CABG x3 s/p hx of MI with stent placement 03/2007, OSA, depression, anxiety, CKD Stage III presented to the [facility] ED [facility] EMS from SNF 6[facility] for chief complaint of altered mental status. GI consulted for further evaluation and management of acute blood loss anemia.    Upper GI bleed - hemoglobin 7.7 this morning. No signs of overt gastrointestinal bleeding.   Acute respiratory failure - on ventilatory support   Decompensated cirrhosis of the liver c/b ascites, EV, and HE -  MELD 3.0 21   Hepatic encephalopathy   Acute HBV - recently diagnosed last month during hospitalization, on tenofovir   Severe sepsis    AKI   T2DM   CAD   Morbid obesity   DNR  Recommendations:  - Maintain 2 large bore IVs for access - Continue to monitor serial H&H. Transfuse for Hgb <7.0.  - Continue Octreotide gtt, Protonix gtt, and Ceftriaxone 2 grams IV q24hr. OK to discontinue Octreotide this evening. Discussed with pharmacy.  - Keep blood/IVF infusion rates low to avoid increasing portal pressures that could potentially lead to variceal rupture - Continue to monitor for signs of active GI bleeding - No signs of overt GI bleeding at this time - Defer any endoscopic evaluation at this time in light of no overt GI bleeding - Lactulose enemas q6h as ordered. Continue to monitor mental status closely. Titrate to goal 3-4 loose bowel movements daily.  - Hold antiplatelet/anticoagulation - Avoid hepatotoxins  - Continue management of comorbidities per primary team  - EGD when clinically feasible, ideally after mental status improves - NPO - Continue recs per palliative care. Prognosis is guarded. Planning for one way extubation on Saturday when daughter arrives.  - GI will sign off at this time and follow peripherally. If there are signs of ovet GI bleeding, please call Dr. Norma Fredrickson.   Please call with questions or concerns.  Jacob Moores, PA-C [facility] [phone]

## 2022-07-02 DIAGNOSIS — I25118 Atherosclerotic heart disease of native coronary artery with other forms of angina pectoris: Secondary | ICD-10-CM

## 2022-07-02 DIAGNOSIS — K7460 Unspecified cirrhosis of liver: Secondary | ICD-10-CM | POA: Diagnosis not present

## 2022-07-02 DIAGNOSIS — R4182 Altered mental status, unspecified: Secondary | ICD-10-CM | POA: Diagnosis not present

## 2022-07-02 DIAGNOSIS — K766 Portal hypertension: Secondary | ICD-10-CM | POA: Diagnosis not present

## 2022-07-02 DIAGNOSIS — K7682 Hepatic encephalopathy: Secondary | ICD-10-CM | POA: Diagnosis not present

## 2022-07-02 LAB — CBC
HCT: 26.8 % — ABNORMAL LOW (ref 39.0–52.0)
Hemoglobin: 8.7 g/dL — ABNORMAL LOW (ref 13.0–17.0)
MCH: 31.8 pg (ref 26.0–34.0)
MCHC: 32.5 g/dL (ref 30.0–36.0)
MCV: 97.8 fL (ref 80.0–100.0)
Platelets: 48 10*3/uL — ABNORMAL LOW (ref 150–400)
RBC: 2.74 MIL/uL — ABNORMAL LOW (ref 4.22–5.81)
RDW: 21.2 % — ABNORMAL HIGH (ref 11.5–15.5)
WBC: 9.5 10*3/uL (ref 4.0–10.5)
nRBC: 0.2 % (ref 0.0–0.2)

## 2022-07-02 LAB — BASIC METABOLIC PANEL
Anion gap: 6 (ref 5–15)
BUN: 23 mg/dL (ref 8–23)
CO2: 29 mmol/L (ref 22–32)
Calcium: 7.7 mg/dL — ABNORMAL LOW (ref 8.9–10.3)
Chloride: 112 mmol/L — ABNORMAL HIGH (ref 98–111)
Creatinine, Ser: 0.87 mg/dL (ref 0.61–1.24)
GFR, Estimated: >60 mL/min (ref >60)
Glucose, Bld: 188 mg/dL — ABNORMAL HIGH (ref 70–99)
Potassium: 3 mmol/L — ABNORMAL LOW (ref 3.5–5.1)
Sodium: 147 mmol/L — ABNORMAL HIGH (ref 135–145)

## 2022-07-02 LAB — HEMOGLOBIN AND HEMATOCRIT, BLOOD
HCT: 25.4 % — ABNORMAL LOW (ref 39.0–52.0)
Hemoglobin: 8.1 g/dL — ABNORMAL LOW (ref 13.0–17.0)

## 2022-07-02 LAB — GLUCOSE, CAPILLARY
Glucose-Capillary: 146 mg/dL — ABNORMAL HIGH (ref 70–99)
Glucose-Capillary: 155 mg/dL — ABNORMAL HIGH (ref 70–99)
Glucose-Capillary: 164 mg/dL — ABNORMAL HIGH (ref 70–99)
Glucose-Capillary: 174 mg/dL — ABNORMAL HIGH (ref 70–99)
Glucose-Capillary: 164 mg/dL — ABNORMAL HIGH (ref 70–99)
Glucose-Capillary: 218 mg/dL — ABNORMAL HIGH (ref 70–99)

## 2022-07-02 LAB — PROTIME-INR
INR: 2.1 — ABNORMAL HIGH (ref 0.8–1.2)
Prothrombin Time: 24.2 seconds — ABNORMAL HIGH (ref 11.4–15.2)

## 2022-07-02 LAB — MAGNESIUM: Magnesium: 1.9 mg/dL (ref 1.7–2.4)

## 2022-07-02 LAB — PHOSPHORUS: Phosphorus: 1.8 mg/dL — ABNORMAL LOW (ref 2.5–4.6)

## 2022-07-02 MED ORDER — ORAL CARE MOUTH RINSE
15.0000 mL | OROMUCOSAL | Status: DC
  Administered 2022-07-02 – 2022-07-03 (×14): 15 mL via OROMUCOSAL

## 2022-07-02 MED ORDER — HEPARIN SODIUM (PORCINE) 5000 UNIT/ML IJ SOLN: 5000.0000 [IU] | Freq: Two times a day (BID) | INTRAMUSCULAR | Status: DC

## 2022-07-02 MED ORDER — ORAL CARE MOUTH RINSE: 15.0000 mL | OROMUCOSAL | Status: DC | PRN

## 2022-07-02 MED ORDER — POTASSIUM CHLORIDE 10 MEQ/100ML IV SOLN
10.0000 meq | INTRAVENOUS | Status: AC
  Administered 2022-07-02 (×4): 10 meq via INTRAVENOUS
  Filled 2022-07-02 (×4): qty 100

## 2022-07-02 MED ORDER — POTASSIUM PHOSPHATES 15 MMOLE/5ML IV SOLN
30.0000 mmol | Freq: Once | INTRAVENOUS | Status: AC
  Administered 2022-07-02: 30 mmol via INTRAVENOUS
  Filled 2022-07-02: qty 10

## 2022-07-02 NOTE — Progress Notes (Signed)
Attempted to get a respiratory culture, unsuccessful as patient did not have any secretions.

## 2022-07-02 NOTE — Progress Notes (Addendum)
PHARMACY CONSULT NOTE   Pharmacy Consult for Electrolyte Monitoring and Replacement   Recent Labs: Potassium (mmol/L)  Date Value  07/02/2022 3.0 (L)   Magnesium (mg/dL)  Date Value  16/10/9602 2.1   Calcium (mg/dL)  Date Value  54/09/8117 7.7 (L)   Albumin (g/dL)  Date Value  14/78/2956 2.7 (L)  05/30/2015 4.3   Phosphorus (mg/dL)  Date Value  21/30/8657 2.3 (L)   Sodium (mmol/L)  Date Value  07/02/2022 147 (H)  04/14/2017 139    Assessment: 67 year old male admitted to CCU with hepatic encephalopathy and possible GI bleed. Past medical history includes cirrhosis with ascites and varices, CVA, type 2 diabetes, CAD, depression/anxiety, CKD stage III.    Goal of Therapy:  Electrolytes within normal limits  Plan:  Phos 1.9. K-phos IV 30 mmol x 1 K+ 3.0. Kcl IV 10 meq x 4 runs Follow up electrolytes tomorrow AM   Elliot Gurney, PharmD, BCPS Clinical Pharmacist  07/02/2022 8:00 AM

## 2022-07-02 NOTE — Progress Notes (Signed)
NAME:  Barry [name], MRN:  [id number], DOB:  05-20-1955, LOS: 6 ADMISSION DATE:  07/22/2022, CONSULTATION DATE: 06/29/2022 REFERRING MD: Dr. Clide Dales, CHIEF COMPLAINT: Altered Mental Status    History of Present Illness:  67 y.o. male with past medical history significant for decompensated cirrhosis with ascites and varices, CVA, type 2 diabetes, CAD, depression/anxiety, CKD stage III, who is admitted with Acute Metabolic/Hepatic Encephalopathy, Acute Upper GI Bleed, Severe Sepsis due to presumed SBP, and AKI.  Hospital course complicated by Acute Hypoxic Respiratory Failure due to pulmonary edema & presumed aspiration requiring intubation and mechanical ventilation.  History of Present Illness:  This is a 67 yo male who presented to [facility] ER via EMS from an LTACH on 06/1 with altered mental status.  Per ER notes his last known well time was 7:00 am on 06/1 although he was somewhat drowsy.  He was later found altered at 1400 on 06/1, EMS notified.  EMS reported upon their arrival pt had a GCS of 13 with confusion, nonsensical speech, and unable to follow commands.  ED Course Upon arrival to the ER pt remained altered.  Significant ER results were: BUN 43/creatinine 1.47/calcium 7.8/albumin 2.0/AST 110/ammonia 92/total bilirubin 2.6/lactic acid 2.1/wbc 17.1/hgb 6.0/platelets 139/PT 21.1/INR 1.8/UA negative for UTI.  CXR concerning for atelectasis.  CT Head negative for acute intracranial abnormalities.  COVID-19/Influenza A&B/RSV negative.  Pt received 1.5L NS bolus, iv thiamine, ceftriaxone, flagyl, and 1 unit of pRBC's.  He was subsequently admitted to the [facility] unit per hospitalist team for additional workup and treatment.  See detailed hospital course below under significant events.  CT Head: No acute intracranial abnormalities.  Mild cerebral atrophy.  Air-fluid levels in the left mastoid air cells may indicate mastoiditis. No acute fractures are identified.  Pertinent  Medical History    Anemia      Anxiety     Arthritis     Cerebrovascular disease     Cervical disc disorder     Chronic kidney disease     Cirrhosis of liver (HCC)     Coronary artery disease     Depression     Dyspnea      with exertion   Esophageal varices (HCC)      hx of   Family history of colon cancer 10/15/2019   GERD (gastroesophageal reflux disease)     Headache     Hyperlipidemia     Hyperlipidemia     Hypocalcemia     MI (myocardial infarction) (HCC) 04/23/2007    inferior wall   Morbid obesity (HCC) 06/26/2012   Neuropathy      secondary to diabetes   Pancreatitis      hs of   S/P CABG x 3 07/04/2012    LIMA to LAD, SVG to D1, SVG to PDA, EVH via right thigh   Sarcoidosis of skin     Sleep apnea      no cpap   Stroke (HCC)      small stroke - 2023 - legs weak , righ tleg weak   Type II or unspecified type diabetes mellitus without mention of complication, not stated as uncontrolled     Unspecified essential hypertension    Significant Hospital Events: Including procedures, antibiotic start and stop dates in addition to other pertinent events   06/1: Pt admitted to the [facility] unit with hepatic encephalopathy due to decompensated cirrhosis, hyperbilirubinemia, acute blood loss anemia due to suspected GI bleed, sepsis secondary to SBP, and AKI 06/2: GI  consulted pt noted to have large melenic stools recommended IV PPI and octreotide gtt and transfuse blood products gently due to concern of variceal bleeding with plans for EGD when clinically feasible  06/3: Pt underwent paracentesis per IR that yielded 3 liters of yellow fluid 06/4: Pt transferred to the stepdown unit with worsening acute hypoxic respiratory failure secondary to pulmonary edema and suspected aspiration requiring Bipap.  PCCM team consulted to assist with management.  INTUBATED 06/5: Remains on vent, Levophed weaned off. Receiving 1 unit of pRBC's for Hgb of 6.4, no overt bleeding noted. 06/6: On minimal vent support,  attempted WUA & SBT but failed due to hypoxia. Hgb stable following 1 unit pRBC yesterday, no bleeding noted. INR increased to 2.4 from 1.7, will give Vitamin K.  Start D5W for hypernatremia. 06/7: Had to be restarted on Levophed. Remains on vent, minimal vent settings, holding on SBT as plan for 1 way extubation when daughter arrives. Hgb and platelets stable, no bleeding noted.  Micro Data:   07-24-2022: Blood (in aerobic bottle only)>> DIPHTHEROIDS (CORYNEBACTERIUM SPECIES)  2022/07/24: COVID-19/Influenza A&B/RSV>>negative  06/2: MRSA PCR>> + 06/3: Peritoneal fluid culture>> no growth to date  Anti-infectives (From admission, onward)    Start     Dose/Rate Route Frequency Ordered Stop   06/27/22 1700  cefTRIAXone (ROCEPHIN) 2 g in sodium chloride 0.9 % 100 mL IVPB  Status:  Discontinued        2 g 200 mL/hr over 30 Minutes Intravenous Every 24 hours 07-24-22 2243 06/30/22 1051   06/27/22 1000  tenofovir (VIREAD) tablet 300 mg  Status:  Discontinued        300 mg Oral Daily 2022-07-24 2225 06/28/22 1509   06/27/22 0600  metroNIDAZOLE (FLAGYL) IVPB 500 mg  Status:  Discontinued        500 mg 100 mL/hr over 60 Minutes Intravenous Every 12 hours 2022/07/24 2243 06/30/22 1051   07-24-22 1715  cefTRIAXone (ROCEPHIN) 2 g in sodium chloride 0.9 % 100 mL IVPB        2 g 200 mL/hr over 30 Minutes Intravenous  Once 07-24-22 1713 07/24/22 1824   Jul 24, 2022 1715  metroNIDAZOLE (FLAGYL) IVPB 500 mg        500 mg 100 mL/hr over 60 Minutes Intravenous  Once July 24, 2022 1713 2022/07/24 1954      Interim History / Subjective:  -No significant events noted overnight -Afebrile, hemodynamically stable, Levophed had to be restarted -On minimal vent support (35% FiO2) ~ holding SBT today as plan for 1 way extubation when daughter arrives -Hgb improved to 8.7 from 7.7, no bleeding noted -INR improved to 2.1 from 2.4 following Vitamin K yesterday -Creatinine improved to 0.87 from 1.1 , UOP 985 cc last 24 hrs (net+ 14.8L) -Na  improved to 147 from 150, will continue D5W @ 75 ml/hr   Objective   Blood pressure (!) 108/57, pulse 100, temperature 98.3 F (36.8 C), temperature source Axillary, resp. rate 16, height 5\' 4"  (1.626 m), weight 103.7 kg, SpO2 100 %.    Vent Mode: PRVC FiO2 (%):  [30 %-45 %] 35 % Set Rate:  [16 bmp] 16 bmp Vt Set:  [500 mL] 500 mL PEEP:  [5 cmH20] 5 cmH20 Plateau Pressure:  [18 cmH20-22 cmH20] 18 cmH20   Intake/Output Summary (Last 24 hours) at 07/02/2022 0836 Last data filed at 07/02/2022 0829 Gross per 24 hour  Intake 3629.5 ml  Output 1535 ml  Net 2094.5 ml    Filed Weights   24-Jul-2022  1717 06/28/22 0141 06/30/22 0500  Weight: 107.7 kg 104.7 kg 103.7 kg   Examination: General: Acute on chronically-ill appearing male, laying in bed, intubated and sedated, in NAD HENT: Supple, no JVD, orally intubated Lungs: Coarse throughout, even, nonlabored, occasionally overbreathes the vent Cardiovascular: Sinus tachycardia, s1s2, no r/g, 2+ radial/2+ distal pulses, 2+ generalized edema  Abdomen: +BS x4, obese, soft, non distended, non tender  Extremities: Normal bulk and tone, no deformities, no edema, no cyanosis Neuro: Sedated, withdraws from pain, not following commands, left periorbital ecchymosis, left pupil round/reactive  GU: foley catheter in place draining clear/yellow urine   Resolved Hospital Problem list     Assessment & Plan:   #Acute hypoxic respiratory failure secondary to pulmonary edema & suspected aspiration  Hx: OSA  -Full vent support, implement lung protective strategies -Plateau pressures less than 30 cm H20 -Wean FiO2 & PEEP as tolerated to maintain O2 sats >92% -Follow intermittent Chest X-ray & ABG as needed -Spontaneous Breathing Trials when respiratory parameters met and mental status permits -Implement VAP Bundle -Prn Bronchodilators -ABX as above -Diuresis as BP and renal function permits   #Acute Decompensated HFpEF  #Elevated troponin secondary to  demand ischemia  Hx: CAD, CVA, HLD, inferior MI, HTN, CABG x3 Echo 04/09/22: EF >75%; indeterminate diastolic filling due to E-A fusion, mild tricuspid valve regurgitation  Echo 06/29/22: LVEF 60-65%, normal diastolic parameters, mildly elevated pulmonary artery pressure, normal RV systolic function, mild to moderate TR BNP 999 on 6/4 -Continuous cardiac monitoring -Maintain MAP >65 -Vasopressors as needed to maintain MAP goal ~ weaned off -Trend lactic acid until normalized -HS Troponin peaked at 138 -Hold outpatient aspirin and beta-blocker therapy  -Diuresis as BP and renal function permits   #Acute kidney injury with anion gap metabolic acidosis secondary to ATN, ? Hepatorenal Syndrome ~ RESOLVED #Hypokalemia #Hypernatremia ~ IMPROVING -Monitor I&O's / urinary output -Follow BMP -Ensure adequate renal perfusion -Avoid nephrotoxic agents as able -Replace electrolytes as indicated, Pharmacy following for assistance with electrolyte replacement -Continue D5W @ 75 ml/hr (unable to place NG/OG for free water flushes due to esophageal varices)  #Decompensated cirrhosis  #Acute blood loss anemia secondary to GI bleed  #Thrombocytopenia  #Ascites  #Hyperbilirubinemia  Hx: Grade II esophageal varices and port hypertensive gastropathy  RUQ [place] 06/29/22: Cirrhosis, no cholelithiasis or biliary dilatation -Trend CBC & coags -Monitor for s/sx of bleeding  -Transfuse for hgb <7 and platelets count of <20 and/or signs of bleeding  -GI following, appreciate input: recommending EGD once encephalopathy improves, but will perform urgently if pt develops overt hemorrhage and keep blood/iv fluid infusion rates low to avoid increasing portal pressures that would lead to variceal rupture  -Continue PPI and octreotide gtt for now  -S/p Vitamin K on 6/6  #Sepsis due to suspected SBP ~ TREATED #Gram negative rods in blood cultures, likely contaminant ? -Monitor fever curve -Trend WBC's &  Procalcitonin -Follow cultures as above -Completed course of Ceftriaxone -GI following, appreciate input   #Type II diabetes mellitus  -CBG's q4h; Target range of 140 to 180 -SSI -Follow ICU Hypo/Hyperglycemia protocol  #Metabolic/Hepatic encephalopathy  #Sedation needs in the setting of mechanical ventilation -Treatment of hyperammonemia, sepsis, and metabolic derangements as outlined above -Maintain a RASS goal of 0 to -1 -Fentanyl as needed to maintain RASS goal -Avoid sedating medications as able -Daily wake up assessment -Continue lactulose enema's  q6h, titrate to goal 3-4 loose BM's daily -Continue high dose thiamine for 4 days followed by 100 mg iv  thiamine daily      Pt is critically ill with multiorgan failure, prognosis is guarded.  High risk for further decompensation, cardiac arrest and death.  Given severe Cirrhosis and multiple chronic co morbidities, overall long term prognosis is poor.  Pt is DNR.  Family is considering 1 way extubation as some point.  Palliative Care is following for assistance with GOC conversations.  Best Practice (right click and "Reselect all SmartList Selections" daily)   Diet/type: NPO, (unable to place enteral access due varices) DVT prophylaxis: SCD (chemical ppx contraindicated due to GI bleed) GI prophylaxis: PPI Lines: RUQ PICC Line, and is still indicated Foley:  yes, and is still indicated Code Status:  DNR Last date of multidisciplinary goals of care discussion [07/02/2022]  6/7: Will update pt's family when they arrive at bedside.  Labs   CBC: Recent Labs  Lab 07/25/2022 1819 06/27/22 0550 06/29/22 0414 06/29/22 1626 06/30/22 0409 06/30/22 1451 07/01/22 0200 07/01/22 0428 07/01/22 0912 07/01/22 1427 07/02/22 0448  WBC 17.2*   < > 6.4 7.6 5.0  --   --  7.2  --   --  9.5  NEUTROABS 14.3*  --   --  6.2 3.6  --   --   --   --   --   --   HGB 6.0*   < > 7.2* 7.1* 6.4*   < > 7.6* 7.9* 8.0* 7.7* 8.7*  HCT 19.1*   < > 22.6*  22.6* 20.6*   < > 24.0* 24.7* 25.0* 24.0* 26.8*  MCV 101.1*   < > 100.4* 99.6 100.0  --   --  98.0  --   --  97.8  PLT 139*   < > 44* 41* 34*  --   --  34*  --   --  48*   < > = values in this interval not displayed.     Basic Metabolic Panel: Recent Labs  Lab 06/28/22 0337 06/29/22 0414 06/29/22 1626 06/30/22 0409 06/30/22 1451 07/01/22 0428 07/02/22 0448  NA 145 144  --  145  --  150* 147*  K 3.4* 3.6  --  2.9* 3.6 3.5 3.0*  CL 109 105  --  109  --  113* 112*  CO2 21* 18*  --  26  --  25 29  GLUCOSE 91 188*  --  140*  --  116* 188*  BUN 40* 37*  --  29*  --  26* 23  CREATININE 1.35* 1.39*  --  1.27*  --  1.10 0.87  CALCIUM 7.4* 8.5*  --  7.6*  --  7.6* 7.7*  MG  --   --  1.6* 2.2  --  2.1  --   PHOS  --   --  3.9 2.5  --  2.3*  --     GFR: Estimated Creatinine Clearance: 92.2 mL/min (by C-G formula based on SCr of 0.87 mg/dL). Recent Labs  Lab 06/29/2022 1723 07/25/2022 1819 07/21/2022 2116 06/27/22 0550 06/29/22 0414 06/29/22 1115 06/29/22 1626 06/30/22 0409 07/01/22 0428 07/02/22 0448  PROCALCITON  --   --   --   --  0.20  --   --   --   --   --   WBC  --    < >  --    < > 6.4  --  7.6 5.0 7.2 9.5  LATICACIDVEN 2.1*  --  1.9  --   --  0.9  --   --   --   --    < > =  values in this interval not displayed.     Liver Function Tests: Recent Labs  Lab 07-24-22 1721 06/27/22 0550 06/28/22 0337 06/29/22 0414 06/30/22 0409  AST 110* 88* 97* 125*  --   ALT 42 35 38 49*  --   ALKPHOS 120 92 91 92  --   BILITOT 2.6* 2.7* 2.8* 2.7*  --   PROT 6.4* 5.9* 5.4* 6.3*  --   ALBUMIN 2.0* 2.5* 2.3* 3.3* 2.7*    No results for input(s): "LIPASE", "AMYLASE" in the last 168 hours. Recent Labs  Lab 07-24-2022 1819 06/28/22 0337 06/29/22 0414  AMMONIA 92* 21 48*     ABG    Component Value Date/Time   PHART 7.25 (L) 06/29/2022 1428   PCO2ART 55 (H) 06/29/2022 1428   PO2ART 99 06/29/2022 1428   HCO3 24.1 06/29/2022 1428   TCO2 25 07/04/2012 2031   ACIDBASEDEF 3.9  (H) 06/29/2022 1428   O2SAT 98.1 06/29/2022 1428     Coagulation Profile: Recent Labs  Lab 2022/07/24 1721 06/27/22 1513 07/01/22 0428 07/02/22 0448  INR 1.8* 1.7* 2.4* 2.1*     Cardiac Enzymes: No results for input(s): "CKTOTAL", "CKMB", "CKMBINDEX", "TROPONINI" in the last 168 hours.  HbA1C: Hgb A1c MFr Bld  Date/Time Value Ref Range Status  05/06/2022 10:51 AM 5.8 (H) 4.8 - 5.6 % Final    Comment:    (NOTE) Pre diabetes:          5.7%-6.4%  Diabetes:              >6.4%  Glycemic control for   <7.0% adults with diabetes   03/01/2022 02:00 PM 6.7 (H) 4.6 - 6.5 % Final    Comment:    Glycemic Control Guidelines for People with Diabetes:Non Diabetic:  <6%Goal of Therapy: <7%Additional Action Suggested:  >8%     CBG: Recent Labs  Lab 07/01/22 1525 07/01/22 1917 07/01/22 2325 07/02/22 0337 07/02/22 0709  GLUCAP 135* 146* 150* 155* 164*     Review of Systems:   Unable to assess due to intubation/sedation/critical illness  Past Medical History:  He,  has a past medical history of Anemia, Anxiety, Arthritis, Cerebrovascular disease, Cervical disc disorder, Chronic kidney disease, Cirrhosis of liver (HCC), Coronary artery disease, Depression, Dyspnea, Esophageal varices (HCC), Family history of colon cancer (10/15/2019), GERD (gastroesophageal reflux disease), Headache, Hyperlipidemia, Hyperlipidemia, Hypocalcemia, MI (myocardial infarction) (HCC) (04/23/2007), Morbid obesity (HCC) (06/26/2012), Neuropathy, Pancreatitis, S/P CABG x 3 (07/04/2012), Sarcoidosis of skin, Sleep apnea, Stroke (HCC), Type II or unspecified type diabetes mellitus without mention of complication, not stated as uncontrolled, and Unspecified essential hypertension.   Surgical History:   Past Surgical History:  Procedure Laterality Date   CORONARY ANGIOPLASTY WITH STENT PLACEMENT  04/23/2007   PCI and stenting of mid RCA - Dr Bary Castilla @ [facility]   CORONARY ARTERY BYPASS GRAFT N/A 07/04/2012    Procedure: CORONARY ARTERY BYPASS GRAFTING (CABG);  Surgeon: [name], MD;  Location: [facility] OR;  Service: Open Heart Surgery;  Laterality: N/A;  x3 using right greater saphenous vein and left internal mammary.    ESOPHAGOGASTRODUODENOSCOPY (EGD) WITH PROPOFOL N/A 06/01/2022   Procedure: ESOPHAGOGASTRODUODENOSCOPY (EGD) WITH PROPOFOL;  Surgeon: Meryl Dare, MD;  Location: [facility] ENDOSCOPY;  Service: Gastroenterology;  Laterality: N/A;   INTRAOPERATIVE TRANSESOPHAGEAL ECHOCARDIOGRAM N/A 07/04/2012   Procedure: INTRAOPERATIVE TRANSESOPHAGEAL ECHOCARDIOGRAM;  Surgeon: [name], MD;  Location: [facility] OR;  Service: Open Heart Surgery;  Laterality: N/A;   LEFT HEART CATH AND CORS/GRAFTS ANGIOGRAPHY N/A  04/18/2017   Procedure: LEFT HEART CATH AND CORS/GRAFTS ANGIOGRAPHY;  Surgeon: [name], MD;  Location: [facility] INVASIVE CV LAB;  Service: Cardiovascular;  Laterality: N/A;   LEFT HEART CATHETERIZATION WITH CORONARY ANGIOGRAM N/A 06/25/2012   Procedure: LEFT HEART CATHETERIZATION WITH CORONARY ANGIOGRAM;  Surgeon: [name], MD;  Location: [facility] CATH LAB;  Service: Cardiovascular;  Laterality: N/A;   TOOTH EXTRACTION  04/2018   4 teeth pulled      Social History:   reports that he has never smoked. He has never used smokeless tobacco. He reports that he does not drink alcohol and does not use drugs.   Family History:  His family history includes Cancer in his father, maternal aunt, mother, and sister; Colon cancer in his brother and brother; Diabetes in his brother and sister.   Allergies No Known Allergies   Home Medications  Prior to Admission medications   Medication Sig Start Date End Date Taking? Authorizing Provider  acetaminophen (TYLENOL) 500 MG tablet Take 1 tablet (500 mg total) by mouth every 8 (eight) hours as needed for moderate pain. 04/24/22  Yes [name], MD  albuterol (PROVENTIL HFA;VENTOLIN HFA) [phone] Base) MCG/ACT inhaler Inhale 2 puffs into the lungs every 6  (six) hours as needed for wheezing or shortness of breath. 04/21/17  Yes Kirt Boys, DO  aspirin 81 MG chewable tablet Chew 81 mg by mouth daily.   Yes [provider]  atorvastatin (LIPITOR) 80 MG tablet Take 1 tablet (80 mg total) by mouth daily. Patient taking differently: Take 80 mg by mouth every evening. 05/08/22  Yes [name], MD  busPIRone (BUSPAR) 15 MG tablet TAKE 1 TABLET BY MOUTH THREE TIMES DAILY FOR ANXIETY Patient taking differently: Take 15 mg by mouth 3 (three) times daily. 07/19/17  Yes Montez Morita, Monica, DO  carvedilol (COREG) 6.25 MG tablet Take 1 tablet (6.25 mg total) by mouth daily at 12 noon. 06/06/22  Yes Gherghe, Daylene Katayama, MD  cyclobenzaprine (FLEXERIL) 10 MG tablet Take 10 mg by mouth at bedtime. May take an additional 10 mg up to twice daily as needed for pain related to RIGHT HIP, DO NOT GIVE WITHIN 6 HOURS OF SCHEDULED DOSE 07/23/19  Yes [provider]  DULoxetine (CYMBALTA) 20 MG capsule Take 20 mg by mouth daily.   Yes [provider]  FEROSUL 325 (65 Fe) MG tablet Take 325 mg by mouth daily. 10/13/21  Yes [provider]  fluticasone-salmeterol (ADVAIR) 100-50 MCG/ACT AEPB Inhale 1 puff into the lungs 2 (two) times daily.   Yes [provider]  furosemide (LASIX) 40 MG tablet Take 40 mg by mouth daily.   Yes [provider]  gabapentin (NEURONTIN) 300 MG capsule Take 300 mg by mouth in the morning and at bedtime. 09/12/19  Yes [provider]  ipratropium-albuterol (DUONEB) 0.5-2.5 (3) MG/3ML SOLN Take 3 mLs by nebulization every 6 (six) hours as needed.   Yes [provider]  lactulose (CHRONULAC) 10 GM/15ML solution Take 15 mLs (10 g total) by mouth 3 (three) times daily. 04/24/22  Yes [name], MD  metFORMIN (GLUCOPHAGE) 1000 MG tablet TAKE 1 TABLET(1000 MG) BY MOUTH TWICE DAILY Patient taking differently: Take 1,000 mg by mouth 2 (two) times daily with a meal. 09/09/17  Yes  Kirt Boys, DO  oxyCODONE-acetaminophen (PERCOCET) 10-325 MG tablet Take 1 tablet by mouth every 8 (eight) hours as needed for pain. 06/05/22  Yes Leatha Gilding, MD  pantoprazole (PROTONIX)  40 MG tablet Take 1 tablet (40 mg total) by mouth 2 (two) times daily before a meal. TAKE 1 TABLET BY MOUTH EVERY DAY 06/05/22  Yes Gherghe, Daylene Katayama, MD  potassium chloride (KLOR-CON) 10 MEQ tablet Take 10 mEq by mouth 2 (two) times daily.   Yes [provider]  sertraline (ZOLOFT) 100 MG tablet TAKE 2 TABLETS(200 MG) BY MOUTH DAILY Patient taking differently: Take 200 mg by mouth daily. 04/20/19  Yes Sharon Seller, NP  tenofovir (VIREAD) 300 MG tablet Take 1 tablet (300 mg total) by mouth daily. 06/06/22  Yes Leatha Gilding, MD  thiamine (VITAMIN B-1) 100 MG tablet Take 1 tablet (100 mg total) by mouth daily. 03/06/22  Yes [name], MD  Blood Glucose Monitoring Suppl (CONTOUR NEXT EZ MONITOR) w/Device KIT Test blood sugar three times daily E11.22 03/08/16   Edison Pace, RPH-CPP  cetirizine (ZYRTEC) 10 MG tablet Take 10 mg by mouth daily.    [provider]     Critical care time: 40 minutes     Harlon Ditty, AGACNP-BC [facility] Prefer epic messenger for cross cover needs If after hours, please call E-link

## 2022-07-02 NOTE — TOC Progression Note (Signed)
Transition of Care [facility]) - Progression Note    Patient Details  Name: Barry [name] MRN: [id number] Date of Birth: May 10, 1955  Transition of Care [facility]) CM/SW Contact  Darolyn Rua, [place] Phone Number: 07/02/2022, 10:36 AM  Clinical Narrative:     6/7: Per palliative, current plan is for patient's daughter to arrive this weekend to prepare for one way extubation after family visit. TOC will continue to follow for needs.   6/3:  TOC consult to contact Chales Abrahams. CSW spoke with Chales Abrahams who is noted to be ex wife in chart, she confirms patient is from [facility]. Patient was there for STR admitted 5/30, reports plan would be to return.       Barriers to Discharge: Continued Medical Work up  Expected Discharge Plan and Services       Living arrangements for the past 2 months: Skilled Nursing Facility                                       Social Determinants of Health (SDOH) Interventions SDOH Screenings   Food Insecurity: Patient Unable To Answer (06/28/2022)  Housing: Patient Unable To Answer (06/28/2022)  Transportation Needs: Patient Unable To Answer (06/28/2022)  Utilities: Patient Unable To Answer (06/28/2022)  Depression (PHQ2-9): Low Risk  (05/16/2019)  Tobacco Use: Low Risk  (07/12/2022)    Readmission Risk Interventions    06/03/2022   11:00 AM 06/02/2022    1:36 PM  Readmission Risk Prevention Plan  Transportation Screening Complete Complete  PCP or Specialist Appt within 5-7 Days  Complete  Home Care Screening  Complete  Medication Review (RN CM)  Complete

## 2022-07-02 NOTE — Progress Notes (Signed)
Visited with Barry [name] at his bedside, remains intubated and sedated. No family at bedside during time of visit.  Spoke with Aundra Millet, RN at bedside who shares daughter unable to visit today but plans to be at bedside tomorrow 6/8. Will attempt to meet with her tomorrow to discuss goals of care.  No Charge.  Leeanne Deed, DNP, AGNP-C Palliative Medicine  Please call Palliative Medicine team phone with any questions [phone]. For individual providers please see AMION.

## 2022-07-03 DIAGNOSIS — Z794 Long term (current) use of insulin: Secondary | ICD-10-CM

## 2022-07-03 DIAGNOSIS — E1140 Type 2 diabetes mellitus with diabetic neuropathy, unspecified: Secondary | ICD-10-CM | POA: Diagnosis not present

## 2022-07-03 DIAGNOSIS — R4182 Altered mental status, unspecified: Secondary | ICD-10-CM | POA: Diagnosis not present

## 2022-07-03 DIAGNOSIS — K7682 Hepatic encephalopathy: Secondary | ICD-10-CM | POA: Diagnosis not present

## 2022-07-03 DIAGNOSIS — K7460 Unspecified cirrhosis of liver: Secondary | ICD-10-CM | POA: Diagnosis not present

## 2022-07-03 DIAGNOSIS — N179 Acute kidney failure, unspecified: Secondary | ICD-10-CM | POA: Diagnosis not present

## 2022-07-03 LAB — BASIC METABOLIC PANEL
Anion gap: 7 (ref 5–15)
BUN: 20 mg/dL (ref 8–23)
CO2: 27 mmol/L (ref 22–32)
Calcium: 7.6 mg/dL — ABNORMAL LOW (ref 8.9–10.3)
Chloride: 107 mmol/L (ref 98–111)
Creatinine, Ser: 0.91 mg/dL (ref 0.61–1.24)
GFR, Estimated: >60 mL/min (ref >60)
Glucose, Bld: 217 mg/dL — ABNORMAL HIGH (ref 70–99)
Potassium: 3.2 mmol/L — ABNORMAL LOW (ref 3.5–5.1)
Sodium: 141 mmol/L (ref 135–145)

## 2022-07-03 LAB — AEROBIC/ANAEROBIC CULTURE W GRAM STAIN (SURGICAL/DEEP WOUND): Gram Stain: NONE SEEN

## 2022-07-03 LAB — GLUCOSE, CAPILLARY
Glucose-Capillary: 182 mg/dL — ABNORMAL HIGH (ref 70–99)
Glucose-Capillary: 184 mg/dL — ABNORMAL HIGH (ref 70–99)
Glucose-Capillary: 185 mg/dL — ABNORMAL HIGH (ref 70–99)
Glucose-Capillary: 189 mg/dL — ABNORMAL HIGH (ref 70–99)

## 2022-07-03 LAB — CBC
HCT: 28.5 % — ABNORMAL LOW (ref 39.0–52.0)
Hemoglobin: 9 g/dL — ABNORMAL LOW (ref 13.0–17.0)
MCH: 31 pg (ref 26.0–34.0)
MCHC: 31.6 g/dL (ref 30.0–36.0)
MCV: 98.3 fL (ref 80.0–100.0)
Platelets: 54 10*3/uL — ABNORMAL LOW (ref 150–400)
RBC: 2.9 MIL/uL — ABNORMAL LOW (ref 4.22–5.81)
RDW: 20.8 % — ABNORMAL HIGH (ref 11.5–15.5)
WBC: 10.2 10*3/uL (ref 4.0–10.5)
nRBC: 0.2 % (ref 0.0–0.2)

## 2022-07-03 LAB — HEMOGLOBIN AND HEMATOCRIT, BLOOD
HCT: 27.6 % — ABNORMAL LOW (ref 39.0–52.0)
Hemoglobin: 8.8 g/dL — ABNORMAL LOW (ref 13.0–17.0)

## 2022-07-03 LAB — PROTIME-INR
INR: 2.3 — ABNORMAL HIGH (ref 0.8–1.2)
Prothrombin Time: 25.3 seconds — ABNORMAL HIGH (ref 11.4–15.2)

## 2022-07-03 LAB — PHOSPHORUS: Phosphorus: 2.7 mg/dL (ref 2.5–4.6)

## 2022-07-03 MED ORDER — LACTATED RINGERS IV SOLN: INTRAVENOUS | Status: DC

## 2022-07-03 MED ORDER — LORAZEPAM 2 MG/ML IJ SOLN
2.0000 mg | INTRAMUSCULAR | Status: DC | PRN
  Filled 2022-07-03: qty 2

## 2022-07-03 MED ORDER — HALOPERIDOL LACTATE 5 MG/ML IJ SOLN: 2.5000 mg | INTRAMUSCULAR | Status: DC | PRN

## 2022-07-03 MED ORDER — POTASSIUM CHLORIDE 10 MEQ/100ML IV SOLN
10.0000 meq | INTRAVENOUS | Status: AC
  Administered 2022-07-03 (×2): 10 meq via INTRAVENOUS
  Filled 2022-07-03 (×2): qty 100

## 2022-07-03 MED ORDER — SODIUM CHLORIDE 0.9 % IV SOLN: INTRAVENOUS | Status: DC

## 2022-07-03 MED ORDER — MORPHINE 100MG IN NS 100ML (1MG/ML) PREMIX INFUSION
0.0000 mg/h | INTRAVENOUS | Status: DC
  Administered 2022-07-03: 5 mg/h via INTRAVENOUS
  Filled 2022-07-03: qty 100

## 2022-07-03 MED ORDER — POLYVINYL ALCOHOL 1.4 % OP SOLN: 1.0000 [drp] | Freq: Four times a day (QID) | OPHTHALMIC | Status: DC | PRN

## 2022-07-03 MED ORDER — MORPHINE BOLUS VIA INFUSION
5.0000 mg | INTRAVENOUS | Status: DC | PRN
  Administered 2022-07-03 (×3): 5 mg via INTRAVENOUS

## 2022-07-03 MED ORDER — GLYCOPYRROLATE 0.2 MG/ML IJ SOLN
0.2000 mg | INTRAMUSCULAR | Status: DC | PRN
  Administered 2022-07-03: 0.2 mg via INTRAVENOUS
  Filled 2022-07-03: qty 1

## 2022-07-26 NOTE — Death Summary Note (Signed)
DEATH SUMMARY   Patient Details  Name: Barry [name] MRN: [id number] DOB: [birth year]  Admission/Discharge Information   Admit Date:  12-Jul-2022  Date of Death:  07/19/2022  Time of Death:  August 04, 1505  Length of Stay: 7  Referring Physician: [name], MD   Reason(s) for Hospitalization  Altered Mental Status   Diagnoses  Preliminary cause of death: Cirrhosis (HCC) Secondary Diagnoses (including complications and co-morbidities):  Principal Problem:   Acute hepatic encephalopathy (HCC) Active Problems:   CAD (coronary artery disease)   Depression with anxiety   Type 2 diabetes mellitus with diabetic neuropathy, with long-term current use of insulin (HCC)   Cirrhosis of liver with ascites (HCC)   AKI (acute kidney injury) (HCC)   Hypertension   Portal hypertensive gastropathy (HCC)   Severe sepsis (HCC)   SBP (spontaneous bacterial peritonitis) (HCC)   Upper GI bleed   Known medical problems   Acute hepatitis B   Hyperbilirubinemia   Acute blood loss anemia   Altered mental status   Acute decompensated HFpEF    Elevated troponin secondary to demand ischemia    Acute hypoxic respiratory failure    Aspiration pneumonia   Brief Hospital Course (including significant findings, care, treatment, and services provided and events leading to death)  [name] is a 67 y.o. year old male with past medical history significant for decompensated cirrhosis with ascites and varices, CVA, type 2 diabetes, CAD, depression/anxiety, CKD stage III, who is admitted with Acute Metabolic/Hepatic Encephalopathy, Acute Upper GI Bleed, Severe Sepsis due to presumed SBP, and AKI. Hospital course complicated by Acute Hypoxic Respiratory Failure due to pulmonary edema & presumed aspiration requiring intubation and mechanical ventilation. See detailed hospital course below under significant events   Significant Hospital Events: Including procedures, antibiotic start and stop dates in addition to other  pertinent events   07/12/2022: Pt admitted to the [facility] unit with hepatic encephalopathy due to decompensated cirrhosis, hyperbilirubinemia, acute blood loss anemia due to suspected GI bleed, sepsis secondary to SBP, and AKI 06/27/22: GI consulted pt noted to have large melenic stools recommended IV PPI and octreotide gtt and transfuse blood products gently due to concern of variceal bleeding with plans for EGD when clinically feasible  06/28/22: Pt underwent paracentesis per IR that yielded 3 liters of yellow fluid 06/29/22: Pt transferred to the stepdown unit with worsening acute hypoxic respiratory failure secondary to pulmonary edema and suspected aspiration requiring Bipap.  PCCM team consulted to assist with management.  INTUBATED 06/30/22: Remains on vent, Levophed weaned off. Receiving 1 unit of pRBC's for Hgb of 6.4, no overt bleeding noted. 07/01/22: On minimal vent support, attempted WUA & SBT but failed due to hypoxia. Hgb stable following 1 unit pRBC yesterday, no bleeding noted. INR increased to 2.4 from 1.7, will give Vitamin K.  Start D5W for hypernatremia. 07/02/22: Had to be restarted on Levophed. Remains on vent, minimal vent settings, holding on SBT as plan for 1 way extubation when daughter arrives. Hgb and platelets stable, no bleeding noted. 2022-07-19: No acute events overnight.  Pt remains mechanically intubated. Family meeting held and family decided to terminally extubate pt and transition to Comfort Measures Only.  Pt expired on 19-Jul-2022 at 1507 with family present at bedside.  Pertinent Labs and Studies  Significant Diagnostic Studies DG Chest Port 1 View  Result Date: 07/01/2022 CLINICAL DATA:  Acute respiratory failure with hypoxia. EXAM: PORTABLE CHEST 1 VIEW COMPARISON:  June 29, 2022. FINDINGS: Stable cardiomediastinal silhouette. Status  post coronary bypass graft. Endotracheal tube is in grossly good position. Stable bilateral lung opacities are noted concerning for pulmonary  edema or possibly multifocal pneumonia. Bony thorax is unremarkable. IMPRESSION: Stable bilateral lung opacities as described above. Electronically Signed   By: Lupita Raider M.D.   On: 07/01/2022 08:58   ECHOCARDIOGRAM LIMITED  Result Date: 06/30/2022    ECHOCARDIOGRAM LIMITED REPORT   Patient Name:   Barry [name] Date of Exam: 06/29/2022 Medical Rec #:  [id number]  Height:       64.0 in Accession #:    [id number] Weight:       230.8 lb Date of Birth:  03/14/1955   BSA:          2.079 m Patient Age:    67 years   BP:           88/50 mmHg Patient Gender: M          HR:           103 bpm. Exam Location:  [facility] Procedure: 2D Echo, Limited Echo, Limited Color Doppler and Cardiac Doppler Indications:     R06.00 Dyspnea  History:         Patient has prior history of Echocardiogram examinations, most                  recent 04/09/2022. CAD and Previous Myocardial Infarction, Prior                  CABG, Stroke; Risk Factors:Hypertension, Diabetes, Dyslipidemia                  and Sleep Apnea. Cirrhosis of Liver.  Sonographer:     Daphine Deutscher RDCS Referring Phys:  [id number] Ezequiel Essex Diagnosing Phys: Yvonne Kendall MD IMPRESSIONS  1. Left ventricular ejection fraction, by estimation, is 60 to 65%. The left ventricle has normal function. Left ventricular diastolic parameters were normal.  2. Pulmonary artery pressure is at least mildly elevated (RVSP 35 mmHg plus central venous/right atrial pressure). Right ventricular systolic function is normal. The right ventricular size is normal.  3. The mitral valve is normal in structure. Mild mitral valve regurgitation. No evidence of mitral stenosis.  4. Tricuspid valve regurgitation is mild to moderate. FINDINGS  Left Ventricle: Left ventricular ejection fraction, by estimation, is 60 to 65%. The left ventricle has normal function. The left ventricular internal cavity size was normal in size. There is no left ventricular hypertrophy. Left ventricular diastolic  parameters were normal. Right Ventricle: Pulmonary artery pressure is at least mildly elevated (RVSP 35 mmHg plus central venous/right atrial pressure). The right ventricular size is normal. No increase in right ventricular wall thickness. Right ventricular systolic function is  normal. Right Atrium: Right atrial size was normal in size. Pericardium: There is no evidence of pericardial effusion. Mitral Valve: The mitral valve is normal in structure. Mild mitral valve regurgitation. No evidence of mitral valve stenosis. Tricuspid Valve: The tricuspid valve is normal in structure. Tricuspid valve regurgitation is mild to moderate. Aortic Valve: Aortic valve mean gradient measures 8.8 mmHg. Aortic valve peak gradient measures 15.1 mmHg. Venous: IVC assessment for right atrial pressure unable to be performed due to mechanical ventilation. LEFT VENTRICLE PLAX 2D LVIDd:         5.20 cm Diastology LVIDs:         3.00 cm LV e' medial:    8.81 cm/s LV PW:         0.80  cm LV E/e' medial:  12.5 LV IVS:        0.80 cm LV e' lateral:   11.90 cm/s                        LV E/e' lateral: 9.2  RIGHT VENTRICLE             IVC RV S prime:     20.00 cm/s  IVC diam: 1.70 cm TAPSE (M-mode): 1.8 cm LEFT ATRIUM         Index       RIGHT ATRIUM           Index LA diam:    5.20 cm 2.50 cm/m  RA Area:     15.69 cm                                 RA Volume:   35.97 ml  17.30 ml/m  AORTIC VALVE AV Vmax:      194.10 cm/s AV Vmean:     141.133 cm/s AV VTI:       0.353 m AV Peak Grad: 15.1 mmHg AV Mean Grad: 8.8 mmHg  AORTA Ao Root diam: 3.30 cm MITRAL VALVE                TRICUSPID VALVE MV Area (PHT): 4.40 cm     TR Peak grad:   35.5 mmHg MV Decel Time: 173 msec     TR Vmax:        298.00 cm/s MV E velocity: 110.00 cm/s MV A velocity: 114.50 cm/s MV E/A ratio:  0.96 Cristal Deer End MD Electronically signed by Yvonne Kendall MD Signature Date/Time: 06/30/2022/7:21:57 AM    Final    DG Chest Port 1 View  Result Date: 06/29/2022 CLINICAL  DATA:  Endotracheally intubated EXAM: PORTABLE CHEST 1 VIEW COMPARISON:  Chest x-ray From the same day. FINDINGS: Endotracheal tube is approximately 1.8 cm above the carina. Persistent extensive airspace opacities bilaterally. No visible pneumothorax. Similar enlarged cardiomediastinal silhouette. Similar position of a right PICC. Median sternotomy and CABG. No acute osseous abnormality. IMPRESSION: 1. Endotracheal tube is approximately 1.8 cm above the carina. Recommend retraction by 2-3 cm. 2. Persistent extensive airspace opacities bilaterally. Electronically Signed   By: Feliberto Harts M.D.   On: 06/29/2022 13:04   US Abdomen Limited RUQ (LIVER/GB)  Result Date: 06/29/2022 CLINICAL DATA:  Elevated bilirubin EXAM: ULTRASOUND ABDOMEN LIMITED RIGHT UPPER QUADRANT COMPARISON:  CT abdomen pelvis 06/01/2022; right upper quadrant ultrasound 06/01/2022 FINDINGS: Exam markedly limited secondary to patient location within the ICU. Gallbladder: No gallstones or wall thickening visualized. No sonographic Murphy sign noted by sonographer. Common bile duct: Diameter: 3 mm Liver: Coarsened echogenicity and nodular contour compatible with cirrhosis. Portal vein is patent on color Doppler imaging with normal direction of blood flow towards the liver. Other: Moderate to large volume perihepatic fluid. IMPRESSION: 1. Exam markedly limited secondary to patient location within the ICU. 2. Cirrhotic morphology of the liver. 3. No cholelithiasis or biliary dilatation. Electronically Signed   By: Annia Belt M.D.   On: 06/29/2022 12:58   DG Chest Port 1 View  Result Date: 06/29/2022 CLINICAL DATA:  Dyspnea EXAM: PORTABLE CHEST 1 VIEW COMPARISON:  2022/07/22 FINDINGS: Cardiac shadow is prominent. Postsurgical changes are noted. Significant increased vascular congestion and parenchymal opacities are noted consistent with edema. No bony abnormality is noted. Right PICC is noted  in satisfactory position. IMPRESSION: Significant  increase in central vascular congestion and parenchymal opacities consistent with edema. Electronically Signed   By: Alcide Clever M.D.   On: 06/29/2022 03:49   US Paracentesis  Result Date: 06/28/2022 INDICATION: Ascites. Request received for diagnostic and therapeutic paracentesis with 3 L maximum. EXAM: ULTRASOUND GUIDED diagnostic and therapeutic PARACENTESIS MEDICATIONS: 6 cc 1% lidocaine COMPLICATIONS: None immediate. PROCEDURE: Informed written consent was obtained from the patient after a discussion of the risks, benefits and alternatives to treatment. A timeout was performed prior to the initiation of the procedure. Initial ultrasound scanning demonstrates a large amount of ascites within the left lower abdominal quadrant. The left lower abdomen was prepped and draped in the usual sterile fashion. 1% lidocaine was used for local anesthesia. Following this, a 19 gauge, 7-cm, Yueh catheter was introduced. An ultrasound image was saved for documentation purposes. The paracentesis was performed. The catheter was removed and a dressing was applied. The patient tolerated the procedure well without immediate post procedural complication. Patient received post-procedure intravenous albumin; see nursing notes for details. FINDINGS: A total of approximately 3 L of yellow fluid was removed. Samples were sent to the laboratory as requested by the clinical team. IMPRESSION: Successful ultrasound-guided paracentesis yielding 3 liters of peritoneal fluid. Procedure performed by Mina Marble, PA-C Electronically Signed   By: Malachy Moan M.D.   On: 06/28/2022 16:14   [place] EKG SITE RITE  Result Date: 06/27/2022 If Site Rite image not attached, placement could not be confirmed due to current cardiac rhythm.  CT Head Wo Contrast  Result Date: 07/12/2022 CLINICAL DATA:  Mental status change of unknown cause. Fall 2 days ago. EXAM: CT HEAD WITHOUT CONTRAST TECHNIQUE: Contiguous axial images were obtained from the  base of the skull through the vertex without intravenous contrast. RADIATION DOSE REDUCTION: This exam was performed according to the departmental dose-optimization program which includes automated exposure control, adjustment of the mA and/or kV according to patient size and/or use of iterative reconstruction technique. COMPARISON:  05/30/2022 FINDINGS: Brain: No evidence of acute infarction, hemorrhage, hydrocephalus, extra-axial collection or mass lesion/mass effect. Mild cerebral atrophy. Vascular: No hyperdense vessel or unexpected calcification. Skull: Normal. Negative for fracture or focal lesion. Sinuses/Orbits: Paranasal sinuses demonstrate mild mucosal thickening. Opacification of the left mastoid air cells with air-fluid level possibly indicating mastoiditis. Right ocular prosthesis. Other: None. IMPRESSION: 1. No acute intracranial abnormalities.  Mild cerebral atrophy. 2. Air-fluid levels in the left mastoid air cells may indicate mastoiditis. No acute fractures are identified. Electronically Signed   By: Burman Nieves M.D.   On: 07/17/2022 18:06   DG Chest Port 1 View  Result Date: 07/17/2022 CLINICAL DATA:  Questionable sepsis. EXAM: PORTABLE CHEST 1 VIEW COMPARISON:  May 30, 2022 FINDINGS: The cardiomediastinal silhouette is normal. Obscuration left hemidiaphragm, also identified on the previous study. No definite infiltrate on the right. No other abnormalities. IMPRESSION: Stable obscuration of the left hemidiaphragm is favored represent atelectasis. A PA and lateral chest x-ray could better evaluate. No other abnormalities. Electronically Signed   By: Gerome Sam III M.D.   On: 07/02/2022 17:38    Microbiology Recent Results (from the past 240 hour(s))  Resp panel by RT-PCR (RSV, Flu A&B, Covid) Anterior Nasal Swab     Status: None   Collection Time: 07/12/2022  5:21 PM   Specimen: Anterior Nasal Swab  Result Value Ref Range Status   SARS Coronavirus 2 by RT PCR NEGATIVE NEGATIVE  Final    Comment: (  NOTE) SARS-CoV-2 target nucleic acids are NOT DETECTED.  The SARS-CoV-2 RNA is generally detectable in upper respiratory specimens during the acute phase of infection. The lowest concentration of SARS-CoV-2 viral copies this assay can detect is 138 copies/mL. A negative result does not preclude SARS-Cov-2 infection and should not be used as the sole basis for treatment or other patient management decisions. A negative result may occur with  improper specimen collection/handling, submission of specimen other than nasopharyngeal swab, presence of viral mutation(s) within the areas targeted by this assay, and inadequate number of viral copies(<138 copies/mL). A negative result must be combined with clinical observations, patient history, and epidemiological information. The expected result is Negative.  Fact Sheet for Patients:  BloggerCourse.com  Fact Sheet for Healthcare Providers:  SeriousBroker.it  This test is no t yet approved or cleared by the [place] FDA and  has been authorized for detection and/or diagnosis of SARS-CoV-2 by FDA under an Emergency Use Authorization (EUA). This EUA will remain  in effect (meaning this test can be used) for the duration of the COVID-19 declaration under Section 564(b)(1) of the Act, 21 U.S.C.section 360bbb-3(b)(1), unless the authorization is terminated  or revoked sooner.       Influenza A by PCR NEGATIVE NEGATIVE Final   Influenza B by PCR NEGATIVE NEGATIVE Final    Comment: (NOTE) The Xpert Xpress SARS-CoV-2/FLU/RSV plus assay is intended as an aid in the diagnosis of influenza from Nasopharyngeal swab specimens and should not be used as a sole basis for treatment. Nasal washings and aspirates are unacceptable for Xpert Xpress SARS-CoV-2/FLU/RSV testing.  Fact Sheet for Patients: BloggerCourse.com  Fact Sheet for Healthcare  Providers: SeriousBroker.it  This test is not yet approved or cleared by the [place] FDA and has been authorized for detection and/or diagnosis of SARS-CoV-2 by FDA under an Emergency Use Authorization (EUA). This EUA will remain in effect (meaning this test can be used) for the duration of the COVID-19 declaration under Section 564(b)(1) of the Act, 21 U.S.C. section 360bbb-3(b)(1), unless the authorization is terminated or revoked.     Resp Syncytial Virus by PCR NEGATIVE NEGATIVE Final    Comment: (NOTE) Fact Sheet for Patients: BloggerCourse.com  Fact Sheet for Healthcare Providers: SeriousBroker.it  This test is not yet approved or cleared by the [place] FDA and has been authorized for detection and/or diagnosis of SARS-CoV-2 by FDA under an Emergency Use Authorization (EUA). This EUA will remain in effect (meaning this test can be used) for the duration of the COVID-19 declaration under Section 564(b)(1) of the Act, 21 U.S.C. section 360bbb-3(b)(1), unless the authorization is terminated or revoked.  Performed at [facility], [address] [place], [place] [address]   Blood Culture (routine x 2)     Status: None   Collection Time: 07-08-2022  5:21 PM   Specimen: BLOOD  Result Value Ref Range Status   Specimen Description BLOOD RIGHT ANTECUBITAL  Final   Special Requests   Final    BOTTLES DRAWN AEROBIC AND ANAEROBIC Blood Culture adequate volume   Culture   Final    NO GROWTH 5 DAYS Performed at [facility], [address] [place], [place] [address]    Report Status 07/01/2022 FINAL  Final  Blood Culture (routine x 2)     Status: Abnormal   Collection Time: 07-08-22  5:28 PM   Specimen: BLOOD  Result Value Ref Range Status   Specimen Description   Final    BLOOD LEFT  ANTECUBITAL Performed at [facility], [address] [place], [place]  [address]    Special Requests   Final    BOTTLES DRAWN AEROBIC AND ANAEROBIC Blood Culture results may not be optimal due to an excessive volume of blood received in culture bottles Performed at [facility], [address], [place], [place] [address]    Culture  Setup Time   Final    GRAM POSITIVE RODS AEROBIC BOTTLE ONLY CRITICAL RESULT CALLED TO, READ BACK BY AND VERIFIED WITH: RAQUEL RODRIGUEZ GUZMAN 06/29/22 @ 0913 BY SB    Culture (A)  Final    DIPHTHEROIDS(CORYNEBACTERIUM SPECIES) Standardized susceptibility testing for this organism is not available. Performed at [facility], [address] [place], [place] [address]    Report Status 06/30/2022 FINAL  Final  MRSA Next Gen by PCR, Nasal     Status: Abnormal   Collection Time: 06/27/22  5:03 PM   Specimen: Nasal Mucosa; Nasal Swab  Result Value Ref Range Status   MRSA by PCR Next Gen DETECTED (A) NOT DETECTED Final    Comment: RESULT CALLED TO, READ BACK BY AND VERIFIED WITH:  TRACIE NEILSON 06/27/2022 1925 CP (NOTE) The GeneXpert MRSA Assay (FDA approved for NASAL specimens only), is one component of a comprehensive MRSA colonization surveillance program. It is not intended to diagnose MRSA infection nor to guide or monitor treatment for MRSA infections. Test performance is not FDA approved in patients less than 58 years old. Performed at [facility], [address], [place], [place] [address]   Aerobic/Anaerobic Culture w Gram Stain (surgical/deep wound)     Status: None (Preliminary result)   Collection Time: 06/28/22  4:01 PM   Specimen: PATH Cytology Peritoneal fluid  Result Value Ref Range Status   Specimen Description   Final    PERITONEAL Performed at [facility], [address], [place], [place] [address]    Special Requests   Final    PERITONEAL Performed at [facility], [address] [place], [place] [address]    Gram Stain NO WBC SEEN NO ORGANISMS SEEN    Final   Culture   Final    NO GROWTH 4 DAYS NO ANAEROBES ISOLATED; CULTURE IN PROGRESS FOR 5 DAYS Performed at [facility], [address] [place], [place] [address]    Report Status PENDING  Incomplete    Lab Basic Metabolic Panel: Recent Labs  Lab 06/29/22 0414 06/29/22 1626 06/30/22 0409 06/30/22 1451 07/01/22 0428 07/02/22 0448 07/02/2022 0405  NA 144  --  145  --  150* 147* 141  K 3.6  --  2.9* 3.6 3.5 3.0* 3.2*  CL 105  --  109  --  113* 112* 107  CO2 18*  --  26  --  25 29 27   GLUCOSE 188*  --  140*  --  116* 188* 217*  BUN 37*  --  29*  --  26* 23 20  CREATININE 1.39*  --  1.27*  --  1.10 0.87 0.91  CALCIUM 8.5*  --  7.6*  --  7.6* 7.7* 7.6*  MG  --  1.6* 2.2  --  2.1 1.9  --   PHOS  --  3.9 2.5  --  2.3* 1.8* 2.7   Liver Function Tests: Recent Labs  Lab 07/16/2022 1721 06/27/22 0550 06/28/22 0337 06/29/22 0414 06/30/22 0409  AST 110* 88* 97* 125*  --   ALT 42 35 38 49*  --  ALKPHOS 120 92 91 92  --   BILITOT 2.6* 2.7* 2.8* 2.7*  --   PROT 6.4* 5.9* 5.4* 6.3*  --   ALBUMIN 2.0* 2.5* 2.3* 3.3* 2.7*   No results for input(s): "LIPASE", "AMYLASE" in the last 168 hours. Recent Labs  Lab 07/15/2022 1819 06/28/22 0337 06/29/22 0414  AMMONIA 92* 21 48*   CBC: Recent Labs  Lab 07/06/2022 1819 06/27/22 0550 06/29/22 1626 06/30/22 0409 06/30/22 1451 07/01/22 0428 07/01/22 0912 07/01/22 1427 07/02/22 0448 07/02/22 1623 07-18-2022 0405 07-18-22 1245  WBC 17.2*   < > 7.6 5.0  --  7.2  --   --  9.5  --  10.2  --   NEUTROABS 14.3*  --  6.2 3.6  --   --   --   --   --   --   --   --   HGB 6.0*   < > 7.1* 6.4*   < > 7.9*   < > 7.7* 8.7* 8.1* 9.0* 8.8*  HCT 19.1*   < > 22.6* 20.6*   < > 24.7*   < > 24.0* 26.8* 25.4* 28.5* 27.6*  MCV 101.1*   < > 99.6 100.0  --  98.0  --   --  97.8  --  98.3  --   PLT 139*   < > 41* 34*  --  34*  --   --  48*  --  54*  --    < > = values in this interval not displayed.   Cardiac Enzymes: No results for input(s):  "CKTOTAL", "CKMB", "CKMBINDEX", "TROPONINI" in the last 168 hours. Sepsis Labs: Recent Labs  Lab 07/24/2022 1723 07/18/2022 1819 06/29/2022 2116 06/27/22 0550 06/29/22 0414 06/29/22 1115 06/29/22 1626 06/30/22 0409 07/01/22 0428 07/02/22 0448 07/18/22 0405  PROCALCITON  --   --   --   --  0.20  --   --   --   --   --   --   WBC  --    < >  --    < > 6.4  --    < > 5.0 7.2 9.5 10.2  LATICACIDVEN 2.1*  --  1.9  --   --  0.9  --   --   --   --   --    < > = values in this interval not displayed.    Procedures/Operations  Mechanical Intubation  Paracentesis  Right Upper Arm PICC Line Placement   Zada Girt, AGNP  Pulmonary/Critical Care Pager [phone] (please enter 7 digits) PCCM Consult Pager [phone] (please enter 7 digits)

## 2022-07-26 NOTE — Progress Notes (Signed)
                                                                                                                                                                                                           Daily Progress Note   Patient Name: Barry [name]       Date: 07/11/2022 DOB: 04/02/1955  Age: 67 y.o. MRN#: [id number] Attending Physician: No att. providers found Primary Care Physician: [name], MD Admit Date: 07/05/2022  Reason for Consultation/Follow-up: Establishing goals of care  HPI/Brief Hospital Review: 67 y.o. male with past medical history significant for decompensated cirrhosis with ascites and varices, CVA, type 2 diabetes, CAD, depression/anxiety, CKD stage III, who is admitted with Acute Metabolic/Hepatic Encephalopathy, Acute Upper GI Bleed, Severe Sepsis due to presumed SBP, and AKI.  Hospital course complicated by Acute Hypoxic Respiratory Failure due to pulmonary edema & presumed aspiration requiring intubation and mechanical ventilation.   Palliative Medicine consulted for assisting with goals of care conversations.  Subjective: Extensive chart review has been completed prior to meeting patient including labs, vital signs, imaging, progress notes, orders, and available advanced directive documents from current and previous encounters.    Visited with Mr. Sudbury earlier in AM, remains intubated and sedated. Daughter-Kimberly at bedside, arrived from [place] of state. [name] shares other family planning to be at bedside later in AM and would like for all family to be involved in conversations and decision making.  Later rounded once all family present. Met with family including daughter-Kimberly outside in family conference room with Annabelle Harman, NP with ICU team.  Annabelle Harman, NP shared most recent updates and overall clinical condition. [name] shares Mr. Panchal would never want to be placed on mechanical ventilation or "live on machines."  Dana, NP shares process of transitioning to comfort  measures. All family in agreement with proceeding with transition to full comfort measures.  Orders to be managed by CCM team.  Emotional support provided to family. Chaplain paged per family request to offer prayer.   Thank you for allowing the Palliative Medicine Team to assist in the care of this patient.  Total time:  25 minutes  Time spent includes: Detailed review of medical records (labs, imaging, vital signs), medically appropriate exam (mental status, respiratory, cardiac, skin), discussed with treatment team, counseling and educating patient, family and staff, documenting clinical information, medication management and coordination of care.  Leeanne Deed, DNP, AGNP-C Palliative Medicine   Please contact Palliative Medicine Team phone at [phone] for questions and concerns.

## 2022-07-26 NOTE — Progress Notes (Signed)
NAME:  Barry [name], MRN:  [id number], DOB:  1955-05-14, LOS: 7 ADMISSION DATE:  07/02/2022, CONSULTATION DATE: 06/29/2022 REFERRING MD: Dr. Clide Dales, CHIEF COMPLAINT: Altered Mental Status    History of Present Illness:  67 y.o. male with past medical history significant for decompensated cirrhosis with ascites and varices, CVA, type 2 diabetes, CAD, depression/anxiety, CKD stage III, who is admitted with Acute Metabolic/Hepatic Encephalopathy, Acute Upper GI Bleed, Severe Sepsis due to presumed SBP, and AKI.  Hospital course complicated by Acute Hypoxic Respiratory Failure due to pulmonary edema & presumed aspiration requiring intubation and mechanical ventilation.  History of Present Illness:  This is a 67 yo male who presented to [facility] ER via EMS from an LTACH on 06/1 with altered mental status.  Per ER notes his last known well time was 7:00 am on 06/1 although he was somewhat drowsy.  He was later found altered at 1400 on 06/1, EMS notified.  EMS reported upon their arrival pt had a GCS of 13 with confusion, nonsensical speech, and unable to follow commands.  ED Course Upon arrival to the ER pt remained altered.  Significant ER results were: BUN 43/creatinine 1.47/calcium 7.8/albumin 2.0/AST 110/ammonia 92/total bilirubin 2.6/lactic acid 2.1/wbc 17.1/hgb 6.0/platelets 139/PT 21.1/INR 1.8/UA negative for UTI.  CXR concerning for atelectasis.  CT Head negative for acute intracranial abnormalities.  COVID-19/Influenza A&B/RSV negative.  Pt received 1.5L NS bolus, iv thiamine, ceftriaxone, flagyl, and 1 unit of pRBC's.  He was subsequently admitted to the [facility] unit per hospitalist team for additional workup and treatment.  See detailed hospital course below under significant events.  CT Head: No acute intracranial abnormalities.  Mild cerebral atrophy.  Air-fluid levels in the left mastoid air cells may indicate mastoiditis. No acute fractures are identified.  Pertinent  Medical History    Anemia      Anxiety     Arthritis     Cerebrovascular disease     Cervical disc disorder     Chronic kidney disease     Cirrhosis of liver (HCC)     Coronary artery disease     Depression     Dyspnea      with exertion   Esophageal varices (HCC)      hx of   Family history of colon cancer 10/15/2019   GERD (gastroesophageal reflux disease)     Headache     Hyperlipidemia     Hyperlipidemia     Hypocalcemia     MI (myocardial infarction) (HCC) 04/23/2007    inferior wall   Morbid obesity (HCC) 06/26/2012   Neuropathy      secondary to diabetes   Pancreatitis      hs of   S/P CABG x 3 07/04/2012    LIMA to LAD, SVG to D1, SVG to PDA, EVH via right thigh   Sarcoidosis of skin     Sleep apnea      no cpap   Stroke (HCC)      small stroke - 2023 - legs weak , righ tleg weak   Type II or unspecified type diabetes mellitus without mention of complication, not stated as uncontrolled     Unspecified essential hypertension    Significant Hospital Events: Including procedures, antibiotic start and stop dates in addition to other pertinent events   06/1: Pt admitted to the [facility] unit with hepatic encephalopathy due to decompensated cirrhosis, hyperbilirubinemia, acute blood loss anemia due to suspected GI bleed, sepsis secondary to SBP, and AKI 06/2: GI  consulted pt noted to have large melenic stools recommended IV PPI and octreotide gtt and transfuse blood products gently due to concern of variceal bleeding with plans for EGD when clinically feasible  06/3: Pt underwent paracentesis per IR that yielded 3 liters of yellow fluid 06/4: Pt transferred to the stepdown unit with worsening acute hypoxic respiratory failure secondary to pulmonary edema and suspected aspiration requiring Bipap.  PCCM team consulted to assist with management.  INTUBATED 06/5: Remains on vent, Levophed weaned off. Receiving 1 unit of pRBC's for Hgb of 6.4, no overt bleeding noted. 06/6: On minimal vent support,  attempted WUA & SBT but failed due to hypoxia. Hgb stable following 1 unit pRBC yesterday, no bleeding noted. INR increased to 2.4 from 1.7, will give Vitamin K.  Start D5W for hypernatremia. 06/7: Had to be restarted on Levophed. Remains on vent, minimal vent settings, holding on SBT as plan for 1 way extubation when daughter arrives. Hgb and platelets stable, no bleeding noted. 06/8: No acute events overnight.  Pt remains mechanically intubated.  Plans for family meeting today to discuss goals of care   Micro Data:   Jul 16, 2022: Blood (in aerobic bottle only)>> DIPHTHEROIDS (CORYNEBACTERIUM SPECIES)  07/16/2022: COVID-19/Influenza A&B/RSV>>negative  06/2: MRSA PCR>>Positive  06/3: Peritoneal fluid culture>>no growth to date  Anti-infectives (From admission, onward)    Start     Dose/Rate Route Frequency Ordered Stop   06/27/22 1700  cefTRIAXone (ROCEPHIN) 2 g in sodium chloride 0.9 % 100 mL IVPB  Status:  Discontinued        2 g 200 mL/hr over 30 Minutes Intravenous Every 24 hours Jul 16, 2022 2243 06/30/22 1051   06/27/22 1000  tenofovir (VIREAD) tablet 300 mg  Status:  Discontinued        300 mg Oral Daily 07-16-2022 2225 06/28/22 1509   06/27/22 0600  metroNIDAZOLE (FLAGYL) IVPB 500 mg  Status:  Discontinued        500 mg 100 mL/hr over 60 Minutes Intravenous Every 12 hours 16-Jul-2022 2243 06/30/22 1051   2022/07/16 1715  cefTRIAXone (ROCEPHIN) 2 g in sodium chloride 0.9 % 100 mL IVPB        2 g 200 mL/hr over 30 Minutes Intravenous  Once Jul 16, 2022 1713 07-16-22 1824   07-16-2022 1715  metroNIDAZOLE (FLAGYL) IVPB 500 mg        500 mg 100 mL/hr over 60 Minutes Intravenous  Once 2022-07-16 1713 2022-07-16 1954      Interim History / Subjective:     Objective   Blood pressure (!) 106/56, pulse (!) 102, temperature 99.1 F (37.3 C), temperature source Axillary, resp. rate 16, height 5\' 4"  (1.626 m), weight 103.7 kg, SpO2 92 %.    Vent Mode: PRVC FiO2 (%):  [40 %-45 %] 40 % Set Rate:  [16 bmp] 16 bmp Vt  Set:  [500 mL] 500 mL PEEP:  [5 cmH20] 5 cmH20 Plateau Pressure:  [23 cmH20] 23 cmH20   Intake/Output Summary (Last 24 hours) at 07/05/2022 0936 Last data filed at 06/30/2022 0801 Gross per 24 hour  Intake 4346.42 ml  Output 400 ml  Net 3946.42 ml   Filed Weights   16-Jul-2022 1717 06/28/22 0141 06/30/22 0500  Weight: 107.7 kg 104.7 kg 103.7 kg   Examination: General: Acute on chronically-ill appearing male, laying in bed, intubated and sedated, NAD HENT: Supple, no JVD, orally intubated Lungs: Faint rhonchi throughout, even, non labored Cardiovascular: Sinus tachycardia, s1s2, no r/g, 2+ radial/2+ distal pulses, 2+ generalized edema  Abdomen: +  BS x4, obese, soft, non distended, non tender  Extremities: Normal bulk and tone, no deformities, no edema, no cyanosis Neuro: Sedated, not following commands, left periorbital ecchymosis, left pupil round/reactive  GU: Foley catheter in place draining clear/yellow urine   Resolved Hospital Problem list     Assessment & Plan:   #Acute hypoxic respiratory failure secondary to pulmonary edema & suspected aspiration  Hx: OSA  - Full vent support, implement lung protective strategies - Plateau pressures less than 30 cm H20 - Wean FiO2 & PEEP as tolerated to maintain O2 sats >92% - Follow intermittent Chest X-ray & ABG as needed - Spontaneous Breathing Trials when respiratory parameters met and mental status permits - Implement VAP Bundle - Prn Bronchodilators - Diuresis as BP and renal function permits   #Acute Decompensated HFpEF  #Elevated troponin secondary to demand ischemia  Hx: CAD, CVA, HLD, inferior MI, HTN, CABG x3 Echo 04/09/22: EF >75%; indeterminate diastolic filling due to E-A fusion, mild tricuspid valve regurgitation  Echo 06/29/22: LVEF 60-65%, normal diastolic parameters, mildly elevated pulmonary artery pressure, normal RV systolic function, mild to moderate TR BNP 999 on 6/4 - Continuous cardiac monitoring - Vasopressors  as needed to maintain MAP goal >65~ weaned off - Trend lactic acid until normalized - HS Troponin peaked at 138 - Hold outpatient aspirin and beta-blocker therapy  - Diuresis as BP and renal function permits   #Acute kidney injury with anion gap metabolic acidosis secondary to ATN, Possible Hepatorenal Syndrome~resolved  #Hypokalemia #Hypernatremia~resolved  - Trend BMP  - Replace electrolytes as indicated  - Monitor UOP - Avoid nephrotoxic medications   #Decompensated cirrhosis  #Acute blood loss anemia secondary to GI bleed  #Thrombocytopenia  #Ascites  #Hyperbilirubinemia  Hx: Grade II esophageal varices and port hypertensive gastropathy  RUQ [place] 06/29/22: Cirrhosis, no cholelithiasis or biliary dilatation - Trend CBC & coags - Monitor for s/sx of bleeding  - Transfuse for hgb <7 and platelets count of <20 and/or signs of bleeding  - GI signed off: deferring endoscopic evaluation for now given no signs of overt bleeding  - Continue PPI   #Sepsis due to suspected SBP~treated  #Gram negative rods in blood cultures, likely contaminant ? - Trend WBC and monitor fever curve  - Follow cultures as above - Completed course of Ceftriaxone  #Type II diabetes mellitus  - CBG's q4h; Target range of 140 to 180 - SSI - Follow ICU Hypo/Hyperglycemia protocol  #Metabolic/Hepatic encephalopathy  #Sedation needs in the setting of mechanical ventilation - Continue treatment of hyperammonemia and metabolic derangements as outlined above - Maintain a RASS goal of 0 to -1 - Fentanyl as needed to maintain RASS goal - Avoid sedating medications as able - Daily wake up assessment - Continue lactulose enema's q6h, titrate to goal 3-4 loose BM's daily - Continue iv thiamine   Pt is critically ill with multiorgan failure, prognosis is guarded.  High risk for further decompensation, cardiac arrest and death.  Given severe Cirrhosis and multiple chronic co morbidities, overall long term prognosis  is poor.  Pt is DNR.  Family is considering 1 way extubation as some point.  Palliative Care is following for assistance with GOC conversations. Best Practice (right click and "Reselect all SmartList Selections" daily)   Diet/type: NPO, (unable to place enteral access due varices) DVT prophylaxis: SCD (chemical ppx contraindicated due to GI bleed) GI prophylaxis: PPI Lines: RUQ PICC Line, and is still indicated Foley:  yes, and is still indicated Code Status:  DNR Last date of multidisciplinary goals of care discussion [July 21, 2022]  07-21-22: Updated pts daughter at bedside plans for family meeting today to discuss pts condition and plan of care.  Labs   CBC: Recent Labs  Lab 07/02/2022 1819 06/27/22 0550 06/29/22 1626 06/30/22 0409 06/30/22 1451 07/01/22 0428 07/01/22 0912 07/01/22 1427 07/02/22 0448 07/02/22 1623 07-21-22 0405  WBC 17.2*   < > 7.6 5.0  --  7.2  --   --  9.5  --  10.2  NEUTROABS 14.3*  --  6.2 3.6  --   --   --   --   --   --   --   HGB 6.0*   < > 7.1* 6.4*   < > 7.9* 8.0* 7.7* 8.7* 8.1* 9.0*  HCT 19.1*   < > 22.6* 20.6*   < > 24.7* 25.0* 24.0* 26.8* 25.4* 28.5*  MCV 101.1*   < > 99.6 100.0  --  98.0  --   --  97.8  --  98.3  PLT 139*   < > 41* 34*  --  34*  --   --  48*  --  54*   < > = values in this interval not displayed.    Basic Metabolic Panel: Recent Labs  Lab 06/29/22 0414 06/29/22 1626 06/30/22 0409 06/30/22 1451 07/01/22 0428 07/02/22 0448 07-21-22 0405  NA 144  --  145  --  150* 147* 141  K 3.6  --  2.9* 3.6 3.5 3.0* 3.2*  CL 105  --  109  --  113* 112* 107  CO2 18*  --  26  --  25 29 27   GLUCOSE 188*  --  140*  --  116* 188* 217*  BUN 37*  --  29*  --  26* 23 20  CREATININE 1.39*  --  1.27*  --  1.10 0.87 0.91  CALCIUM 8.5*  --  7.6*  --  7.6* 7.7* 7.6*  MG  --  1.6* 2.2  --  2.1 1.9  --   PHOS  --  3.9 2.5  --  2.3* 1.8* 2.7   GFR: Estimated Creatinine Clearance: 88.1 mL/min (by C-G formula based on SCr of 0.91 mg/dL). Recent Labs   Lab 07/18/2022 1723 06/28/2022 1819 06/29/2022 2116 06/27/22 0550 06/29/22 0414 06/29/22 1115 06/29/22 1626 06/30/22 0409 07/01/22 0428 07/02/22 0448 07/21/22 0405  PROCALCITON  --   --   --   --  0.20  --   --   --   --   --   --   WBC  --    < >  --    < > 6.4  --    < > 5.0 7.2 9.5 10.2  LATICACIDVEN 2.1*  --  1.9  --   --  0.9  --   --   --   --   --    < > = values in this interval not displayed.    Liver Function Tests: Recent Labs  Lab 07/14/2022 1721 06/27/22 0550 06/28/22 0337 06/29/22 0414 06/30/22 0409  AST 110* 88* 97* 125*  --   ALT 42 35 38 49*  --   ALKPHOS 120 92 91 92  --   BILITOT 2.6* 2.7* 2.8* 2.7*  --   PROT 6.4* 5.9* 5.4* 6.3*  --   ALBUMIN 2.0* 2.5* 2.3* 3.3* 2.7*   No results for input(s): "LIPASE", "AMYLASE" in the last 168 hours. Recent Labs  Lab 07/19/2022  1819 06/28/22 0337 06/29/22 0414  AMMONIA 92* 21 48*    ABG    Component Value Date/Time   PHART 7.25 (L) 06/29/2022 1428   PCO2ART 55 (H) 06/29/2022 1428   PO2ART 99 06/29/2022 1428   HCO3 24.1 06/29/2022 1428   TCO2 25 07/04/2012 2031   ACIDBASEDEF 3.9 (H) 06/29/2022 1428   O2SAT 98.1 06/29/2022 1428     Coagulation Profile: Recent Labs  Lab 07/15/2022 1721 06/27/22 1513 07/01/22 0428 07/02/22 0448 07/24/2022 0405  INR 1.8* 1.7* 2.4* 2.1* 2.3*    Cardiac Enzymes: No results for input(s): "CKTOTAL", "CKMB", "CKMBINDEX", "TROPONINI" in the last 168 hours.  HbA1C: Hgb A1c MFr Bld  Date/Time Value Ref Range Status  05/06/2022 10:51 AM 5.8 (H) 4.8 - 5.6 % Final    Comment:    (NOTE) Pre diabetes:          5.7%-6.4%  Diabetes:              >6.4%  Glycemic control for   <7.0% adults with diabetes   03/01/2022 02:00 PM 6.7 (H) 4.6 - 6.5 % Final    Comment:    Glycemic Control Guidelines for People with Diabetes:Non Diabetic:  <6%Goal of Therapy: <7%Additional Action Suggested:  >8%     CBG: Recent Labs  Lab 07/02/22 1527 07/02/22 1948 07/02/22 2359 07/07/2022 0409  07/19/2022 0717  GLUCAP 164* 218* 189* 185* 184*    Review of Systems:   Unable to assess due to intubation/sedation/critical illness  Past Medical History:  He,  has a past medical history of Anemia, Anxiety, Arthritis, Cerebrovascular disease, Cervical disc disorder, Chronic kidney disease, Cirrhosis of liver (HCC), Coronary artery disease, Depression, Dyspnea, Esophageal varices (HCC), Family history of colon cancer (10/15/2019), GERD (gastroesophageal reflux disease), Headache, Hyperlipidemia, Hyperlipidemia, Hypocalcemia, MI (myocardial infarction) (HCC) (04/23/2007), Morbid obesity (HCC) (06/26/2012), Neuropathy, Pancreatitis, S/P CABG x 3 (07/04/2012), Sarcoidosis of skin, Sleep apnea, Stroke (HCC), Type II or unspecified type diabetes mellitus without mention of complication, not stated as uncontrolled, and Unspecified essential hypertension.   Surgical History:   Past Surgical History:  Procedure Laterality Date   CORONARY ANGIOPLASTY WITH STENT PLACEMENT  04/23/2007   PCI and stenting of mid RCA - Dr Bary Castilla @ [facility]   CORONARY ARTERY BYPASS GRAFT N/A 07/04/2012   Procedure: CORONARY ARTERY BYPASS GRAFTING (CABG);  Surgeon: [name], MD;  Location: [facility] OR;  Service: Open Heart Surgery;  Laterality: N/A;  x3 using right greater saphenous vein and left internal mammary.    ESOPHAGOGASTRODUODENOSCOPY (EGD) WITH PROPOFOL N/A 06/01/2022   Procedure: ESOPHAGOGASTRODUODENOSCOPY (EGD) WITH PROPOFOL;  Surgeon: Meryl Dare, MD;  Location: [facility] ENDOSCOPY;  Service: Gastroenterology;  Laterality: N/A;   INTRAOPERATIVE TRANSESOPHAGEAL ECHOCARDIOGRAM N/A 07/04/2012   Procedure: INTRAOPERATIVE TRANSESOPHAGEAL ECHOCARDIOGRAM;  Surgeon: [name], MD;  Location: [facility] OR;  Service: Open Heart Surgery;  Laterality: N/A;   LEFT HEART CATH AND CORS/GRAFTS ANGIOGRAPHY N/A 04/18/2017   Procedure: LEFT HEART CATH AND CORS/GRAFTS ANGIOGRAPHY;  Surgeon: [name], MD;  Location: [facility] INVASIVE CV  LAB;  Service: Cardiovascular;  Laterality: N/A;   LEFT HEART CATHETERIZATION WITH CORONARY ANGIOGRAM N/A 06/25/2012   Procedure: LEFT HEART CATHETERIZATION WITH CORONARY ANGIOGRAM;  Surgeon: [name], MD;  Location: [facility] CATH LAB;  Service: Cardiovascular;  Laterality: N/A;   TOOTH EXTRACTION  04/2018   4 teeth pulled      Social History:   reports that he has never smoked. He has never used smokeless tobacco. He reports  that he does not drink alcohol and does not use drugs.   Family History:  His family history includes Cancer in his father, maternal aunt, mother, and sister; Colon cancer in his brother and brother; Diabetes in his brother and sister.   Allergies No Known Allergies   Home Medications  Prior to Admission medications   Medication Sig Start Date End Date Taking? Authorizing Provider  acetaminophen (TYLENOL) 500 MG tablet Take 1 tablet (500 mg total) by mouth every 8 (eight) hours as needed for moderate pain. 04/24/22  Yes [name], MD  albuterol (PROVENTIL HFA;VENTOLIN HFA) 108 [phone] Base) MCG/ACT inhaler Inhale 2 puffs into the lungs every 6 (six) hours as needed for wheezing or shortness of breath. 04/21/17  Yes Kirt Boys, DO  aspirin 81 MG chewable tablet Chew 81 mg by mouth daily.   Yes [provider]  atorvastatin (LIPITOR) 80 MG tablet Take 1 tablet (80 mg total) by mouth daily. Patient taking differently: Take 80 mg by mouth every evening. 05/08/22  Yes [name], MD  busPIRone (BUSPAR) 15 MG tablet TAKE 1 TABLET BY MOUTH THREE TIMES DAILY FOR ANXIETY Patient taking differently: Take 15 mg by mouth 3 (three) times daily. 07/19/17  Yes Montez Morita, Monica, DO  carvedilol (COREG) 6.25 MG tablet Take 1 tablet (6.25 mg total) by mouth daily at 12 noon. 06/06/22  Yes Gherghe, Daylene Katayama, MD  cyclobenzaprine (FLEXERIL) 10 MG tablet Take 10 mg by mouth at bedtime. May take an additional 10 mg up to twice daily as needed for pain related to RIGHT HIP,  DO NOT GIVE WITHIN 6 HOURS OF SCHEDULED DOSE 07/23/19  Yes [provider]  DULoxetine (CYMBALTA) 20 MG capsule Take 20 mg by mouth daily.   Yes [provider]  FEROSUL 325 (65 Fe) MG tablet Take 325 mg by mouth daily. 10/13/21  Yes [provider]  fluticasone-salmeterol (ADVAIR) 100-50 MCG/ACT AEPB Inhale 1 puff into the lungs 2 (two) times daily.   Yes [provider]  furosemide (LASIX) 40 MG tablet Take 40 mg by mouth daily.   Yes [provider]  gabapentin (NEURONTIN) 300 MG capsule Take 300 mg by mouth in the morning and at bedtime. 09/12/19  Yes [provider]  ipratropium-albuterol (DUONEB) 0.5-2.5 (3) MG/3ML SOLN Take 3 mLs by nebulization every 6 (six) hours as needed.   Yes [provider]  lactulose (CHRONULAC) 10 GM/15ML solution Take 15 mLs (10 g total) by mouth 3 (three) times daily. 04/24/22  Yes [name], MD  metFORMIN (GLUCOPHAGE) 1000 MG tablet TAKE 1 TABLET(1000 MG) BY MOUTH TWICE DAILY Patient taking differently: Take 1,000 mg by mouth 2 (two) times daily with a meal. 09/09/17  Yes Kirt Boys, DO  oxyCODONE-acetaminophen (PERCOCET) 10-325 MG tablet Take 1 tablet by mouth every 8 (eight) hours as needed for pain. 06/05/22  Yes Gherghe, Daylene Katayama, MD  pantoprazole (PROTONIX) 40 MG tablet Take 1 tablet (40 mg total) by mouth 2 (two) times daily before a meal. TAKE 1 TABLET BY MOUTH EVERY DAY 06/05/22  Yes Gherghe, Daylene Katayama, MD  potassium chloride (KLOR-CON) 10 MEQ tablet Take 10 mEq by mouth 2 (two) times daily.   Yes [provider]  sertraline (ZOLOFT) 100 MG tablet TAKE 2 TABLETS(200 MG) BY MOUTH DAILY Patient taking differently: Take 200 mg by mouth daily. 04/20/19  Yes Sharon Seller, NP  tenofovir (VIREAD) 300 MG tablet Take 1 tablet (300 mg total) by mouth daily. 06/06/22  Yes Leatha Gilding, MD  thiamine (VITAMIN B-1) 100 MG tablet Take 1 tablet (100 mg total) by mouth daily. 03/06/22  Yes  [name], MD  Blood Glucose Monitoring Suppl (CONTOUR NEXT EZ MONITOR) w/Device KIT Test blood sugar three times daily E11.22 03/08/16   Edison Pace, RPH-CPP  cetirizine (ZYRTEC) 10 MG tablet Take 10 mg by mouth daily.    [provider]     Critical care time: 35 minutes      Zada Girt, AGNP  Pulmonary/Critical Care Pager [phone] (please enter 7 digits) PCCM Consult Pager [phone] (please enter 7 digits)

## 2022-07-26 NOTE — Progress Notes (Signed)
Pt. Extubated to room air. 

## 2022-07-26 NOTE — Progress Notes (Signed)
Morphine gtt started. Robinul administered. Family at bedside reminiscing with pt.

## 2022-07-26 NOTE — IPAL (Signed)
  Interdisciplinary Goals of Care Family Meeting   Date carried out: 07/11/2022  Location of the meeting: Conference room  Member's involved: Nurse Practitioner and Palliative care team member  Durable Power of Attorney or acting medical decision maker: Daughter Sheehan Stacey and multiple family members   Discussion: We discussed goals of care for Barry [name] .  Discussed pt is critically ill with severe cirrhosis and multiple chronic co morbidities.  Family have decided to terminally extubate and transition pt to Comfort Measures Only   Code status:   Code Status: DNR   Disposition: In-patient comfort care  Time spent for the meeting: 15 minutes    Zada Girt, AGNP  Pulmonary/Critical Care Pager [phone] (please enter 7 digits) PCCM Consult Pager [phone] (please enter 7 digits)

## 2022-07-26 NOTE — Progress Notes (Signed)
PHARMACY CONSULT NOTE   Pharmacy Consult for Electrolyte Monitoring and Replacement   Recent Labs: Potassium (mmol/L)  Date Value  06/27/2022 3.2 (L)   Magnesium (mg/dL)  Date Value  16/10/9602 1.9   Calcium (mg/dL)  Date Value  54/09/8117 7.6 (L)   Albumin (g/dL)  Date Value  14/78/2956 2.7 (L)  05/30/2015 4.3   Phosphorus (mg/dL)  Date Value  21/30/8657 2.7   Sodium (mmol/L)  Date Value  06/29/2022 141  04/14/2017 139    Assessment: 67 year old male admitted to CCU with hepatic encephalopathy and possible GI bleed. Past medical history includes cirrhosis with ascites and varices, CVA, type 2 diabetes, CAD, depression/anxiety, CKD stage III.    Goal of Therapy:  Electrolytes within normal limits  Plan:  K+ 3.0. Kcl IV 10 meq x 2 runs Follow up electrolytes tomorrow AM   Bettey Costa, PharmD Clinical Pharmacist 07/13/2022 7:22 AM

## 2022-07-26 NOTE — Progress Notes (Signed)
   07/13/2022 1300  Spiritual Encounters  Type of Visit Initial  Care provided to: Pt and family  Referral source Family  Reason for visit End-of-life  OnCall Visit Yes  Spiritual Framework  Presenting Themes Significant life change  Family Stress Factors Loss;Major life changes  Interventions  Spiritual Care Interventions Made Compassionate presence;Prayer;Supported grief process   Chaplain responded to page to support family of patient recently placed on comfort care.

## 2022-07-26 DEATH — deceased

## 2022-08-24 ENCOUNTER — Ambulatory Visit: Payer: Medicare HMO | Admitting: Gastroenterology
